# Patient Record
Sex: Male | Born: 1965 | Race: Black or African American | Hispanic: No | Smoking: Former smoker
Health system: Southern US, Community
[De-identification: ages and names within clinical notes are randomized; demographics above are authoritative.]

## PROBLEM LIST (undated history)

## (undated) DIAGNOSIS — I509 Heart failure, unspecified: Secondary | ICD-10-CM

## (undated) DIAGNOSIS — I10 Essential (primary) hypertension: Secondary | ICD-10-CM

## (undated) DIAGNOSIS — G7000 Myasthenia gravis without (acute) exacerbation: Secondary | ICD-10-CM

## (undated) DIAGNOSIS — K219 Gastro-esophageal reflux disease without esophagitis: Secondary | ICD-10-CM

## (undated) DIAGNOSIS — N183 Chronic kidney disease, stage 3 unspecified: Secondary | ICD-10-CM

## (undated) DIAGNOSIS — R911 Solitary pulmonary nodule: Secondary | ICD-10-CM

## (undated) DIAGNOSIS — Z95810 Presence of automatic (implantable) cardiac defibrillator: Secondary | ICD-10-CM

## (undated) DIAGNOSIS — K279 Peptic ulcer, site unspecified, unspecified as acute or chronic, without hemorrhage or perforation: Secondary | ICD-10-CM

## (undated) DIAGNOSIS — Z8711 Personal history of peptic ulcer disease: Secondary | ICD-10-CM

## (undated) DIAGNOSIS — R51 Headache: Secondary | ICD-10-CM

## (undated) DIAGNOSIS — I2510 Atherosclerotic heart disease of native coronary artery without angina pectoris: Secondary | ICD-10-CM

## (undated) DIAGNOSIS — F32A Depression, unspecified: Secondary | ICD-10-CM

## (undated) DIAGNOSIS — G4733 Obstructive sleep apnea (adult) (pediatric): Secondary | ICD-10-CM

## (undated) DIAGNOSIS — F1910 Other psychoactive substance abuse, uncomplicated: Secondary | ICD-10-CM

## (undated) DIAGNOSIS — F1011 Alcohol abuse, in remission: Secondary | ICD-10-CM

## (undated) DIAGNOSIS — F329 Major depressive disorder, single episode, unspecified: Secondary | ICD-10-CM

## (undated) HISTORY — PX: FACIAL RECONSTRUCTION SURGERY: SHX631

## (undated) HISTORY — DX: Myasthenia gravis without (acute) exacerbation: G70.00

## (undated) HISTORY — PX: OTHER SURGICAL HISTORY: SHX169

## (undated) HISTORY — PX: KNEE SURGERY: SHX244

---

## 1998-06-12 ENCOUNTER — Emergency Department (HOSPITAL_COMMUNITY): Admission: EM | Admit: 1998-06-12 | Discharge: 1998-06-12 | Payer: Self-pay | Admitting: Emergency Medicine

## 1998-07-02 ENCOUNTER — Emergency Department (HOSPITAL_COMMUNITY): Admission: EM | Admit: 1998-07-02 | Discharge: 1998-07-02 | Payer: Self-pay | Admitting: Internal Medicine

## 1998-07-02 ENCOUNTER — Encounter: Payer: Self-pay | Admitting: Internal Medicine

## 1998-08-24 ENCOUNTER — Encounter: Payer: Self-pay | Admitting: Emergency Medicine

## 1998-08-24 ENCOUNTER — Emergency Department (HOSPITAL_COMMUNITY): Admission: EM | Admit: 1998-08-24 | Discharge: 1998-08-24 | Payer: Self-pay | Admitting: Emergency Medicine

## 1998-11-28 ENCOUNTER — Emergency Department (HOSPITAL_COMMUNITY): Admission: EM | Admit: 1998-11-28 | Discharge: 1998-11-28 | Payer: Self-pay | Admitting: Emergency Medicine

## 1998-11-28 ENCOUNTER — Encounter: Payer: Self-pay | Admitting: Emergency Medicine

## 1999-03-11 ENCOUNTER — Emergency Department (HOSPITAL_COMMUNITY): Admission: EM | Admit: 1999-03-11 | Discharge: 1999-03-11 | Payer: Self-pay

## 1999-12-02 ENCOUNTER — Emergency Department (HOSPITAL_COMMUNITY): Admission: EM | Admit: 1999-12-02 | Discharge: 1999-12-02 | Payer: Self-pay | Admitting: Emergency Medicine

## 2001-03-02 ENCOUNTER — Encounter: Payer: Self-pay | Admitting: Emergency Medicine

## 2001-03-02 ENCOUNTER — Emergency Department (HOSPITAL_COMMUNITY): Admission: EM | Admit: 2001-03-02 | Discharge: 2001-03-02 | Payer: Self-pay | Admitting: Emergency Medicine

## 2001-12-20 ENCOUNTER — Emergency Department (HOSPITAL_COMMUNITY): Admission: EM | Admit: 2001-12-20 | Discharge: 2001-12-20 | Payer: Self-pay | Admitting: Emergency Medicine

## 2002-07-31 ENCOUNTER — Emergency Department (HOSPITAL_COMMUNITY): Admission: EM | Admit: 2002-07-31 | Discharge: 2002-07-31 | Payer: Self-pay | Admitting: Emergency Medicine

## 2002-08-23 ENCOUNTER — Emergency Department (HOSPITAL_COMMUNITY): Admission: EM | Admit: 2002-08-23 | Discharge: 2002-08-23 | Payer: Self-pay | Admitting: Emergency Medicine

## 2002-08-23 ENCOUNTER — Encounter: Payer: Self-pay | Admitting: Emergency Medicine

## 2002-10-01 ENCOUNTER — Emergency Department (HOSPITAL_COMMUNITY): Admission: AD | Admit: 2002-10-01 | Discharge: 2002-10-01 | Payer: Self-pay | Admitting: Emergency Medicine

## 2002-10-01 ENCOUNTER — Encounter: Payer: Self-pay | Admitting: Emergency Medicine

## 2004-03-10 ENCOUNTER — Emergency Department (HOSPITAL_COMMUNITY): Admission: EM | Admit: 2004-03-10 | Discharge: 2004-03-10 | Payer: Self-pay | Admitting: Emergency Medicine

## 2004-04-06 ENCOUNTER — Ambulatory Visit: Payer: Self-pay | Admitting: Internal Medicine

## 2004-04-10 ENCOUNTER — Ambulatory Visit (HOSPITAL_COMMUNITY): Admission: RE | Admit: 2004-04-10 | Discharge: 2004-04-10 | Payer: Self-pay | Admitting: Internal Medicine

## 2004-04-28 ENCOUNTER — Ambulatory Visit: Payer: Self-pay | Admitting: Internal Medicine

## 2004-05-15 ENCOUNTER — Ambulatory Visit: Payer: Self-pay | Admitting: Internal Medicine

## 2004-07-07 ENCOUNTER — Ambulatory Visit: Payer: Self-pay | Admitting: Internal Medicine

## 2004-08-24 ENCOUNTER — Ambulatory Visit: Payer: Self-pay | Admitting: Internal Medicine

## 2005-02-01 ENCOUNTER — Emergency Department (HOSPITAL_COMMUNITY): Admission: EM | Admit: 2005-02-01 | Discharge: 2005-02-01 | Payer: Self-pay | Admitting: Family Medicine

## 2005-03-08 ENCOUNTER — Emergency Department (HOSPITAL_COMMUNITY): Admission: EM | Admit: 2005-03-08 | Discharge: 2005-03-08 | Payer: Self-pay | Admitting: Emergency Medicine

## 2005-03-25 ENCOUNTER — Ambulatory Visit: Payer: Self-pay | Admitting: Internal Medicine

## 2005-03-26 ENCOUNTER — Ambulatory Visit (HOSPITAL_COMMUNITY): Admission: RE | Admit: 2005-03-26 | Discharge: 2005-03-26 | Payer: Self-pay | Admitting: Internal Medicine

## 2005-07-28 ENCOUNTER — Emergency Department (HOSPITAL_COMMUNITY): Admission: EM | Admit: 2005-07-28 | Discharge: 2005-07-28 | Payer: Self-pay | Admitting: Emergency Medicine

## 2005-12-17 ENCOUNTER — Emergency Department (HOSPITAL_COMMUNITY): Admission: EM | Admit: 2005-12-17 | Discharge: 2005-12-17 | Payer: Self-pay | Admitting: Emergency Medicine

## 2006-03-07 ENCOUNTER — Emergency Department (HOSPITAL_COMMUNITY): Admission: EM | Admit: 2006-03-07 | Discharge: 2006-03-07 | Payer: Self-pay | Admitting: Emergency Medicine

## 2006-05-24 ENCOUNTER — Emergency Department (HOSPITAL_COMMUNITY): Admission: EM | Admit: 2006-05-24 | Discharge: 2006-05-24 | Payer: Self-pay | Admitting: *Deleted

## 2006-05-30 ENCOUNTER — Emergency Department (HOSPITAL_COMMUNITY): Admission: EM | Admit: 2006-05-30 | Discharge: 2006-05-30 | Payer: Self-pay | Admitting: Emergency Medicine

## 2006-10-13 ENCOUNTER — Emergency Department (HOSPITAL_COMMUNITY): Admission: EM | Admit: 2006-10-13 | Discharge: 2006-10-13 | Payer: Self-pay | Admitting: Emergency Medicine

## 2006-11-02 ENCOUNTER — Inpatient Hospital Stay (HOSPITAL_COMMUNITY): Admission: EM | Admit: 2006-11-02 | Discharge: 2006-11-05 | Payer: Self-pay | Admitting: Internal Medicine

## 2006-11-02 ENCOUNTER — Ambulatory Visit: Payer: Self-pay | Admitting: Cardiology

## 2006-11-02 ENCOUNTER — Encounter: Payer: Self-pay | Admitting: Emergency Medicine

## 2006-11-03 ENCOUNTER — Encounter: Payer: Self-pay | Admitting: Internal Medicine

## 2006-11-09 ENCOUNTER — Ambulatory Visit: Payer: Self-pay | Admitting: Family Medicine

## 2006-11-11 ENCOUNTER — Emergency Department (HOSPITAL_COMMUNITY): Admission: EM | Admit: 2006-11-11 | Discharge: 2006-11-11 | Payer: Self-pay | Admitting: *Deleted

## 2006-12-02 ENCOUNTER — Inpatient Hospital Stay (HOSPITAL_COMMUNITY): Admission: EM | Admit: 2006-12-02 | Discharge: 2006-12-08 | Payer: Self-pay | Admitting: Emergency Medicine

## 2006-12-02 ENCOUNTER — Ambulatory Visit: Payer: Self-pay | Admitting: Cardiology

## 2006-12-09 ENCOUNTER — Ambulatory Visit (HOSPITAL_BASED_OUTPATIENT_CLINIC_OR_DEPARTMENT_OTHER): Admission: RE | Admit: 2006-12-09 | Discharge: 2006-12-09 | Payer: Self-pay | Admitting: *Deleted

## 2006-12-09 ENCOUNTER — Ambulatory Visit: Payer: Self-pay | Admitting: Internal Medicine

## 2006-12-19 ENCOUNTER — Ambulatory Visit: Payer: Self-pay | Admitting: Internal Medicine

## 2006-12-19 LAB — CONVERTED CEMR LAB
Calcium: 9.2 mg/dL (ref 8.4–10.5)
GFR calc Af Amer: 73 mL/min
Glucose, Bld: 108 mg/dL — ABNORMAL HIGH (ref 70–99)
Potassium: 4.2 meq/L (ref 3.5–5.1)
Pro B Natriuretic peptide (BNP): 330 pg/mL — ABNORMAL HIGH (ref 0.0–100.0)

## 2006-12-30 ENCOUNTER — Ambulatory Visit: Payer: Self-pay | Admitting: Cardiology

## 2007-01-01 ENCOUNTER — Emergency Department (HOSPITAL_COMMUNITY): Admission: EM | Admit: 2007-01-01 | Discharge: 2007-01-01 | Payer: Self-pay | Admitting: Emergency Medicine

## 2007-01-16 ENCOUNTER — Ambulatory Visit: Payer: Self-pay | Admitting: Family Medicine

## 2007-01-23 ENCOUNTER — Ambulatory Visit: Payer: Self-pay | Admitting: Cardiology

## 2007-01-23 ENCOUNTER — Emergency Department (HOSPITAL_COMMUNITY): Admission: EM | Admit: 2007-01-23 | Discharge: 2007-01-23 | Payer: Self-pay | Admitting: Emergency Medicine

## 2007-01-30 ENCOUNTER — Ambulatory Visit: Payer: Self-pay | Admitting: Internal Medicine

## 2007-02-20 ENCOUNTER — Ambulatory Visit: Payer: Self-pay | Admitting: Cardiology

## 2007-02-28 ENCOUNTER — Ambulatory Visit: Payer: Self-pay | Admitting: Internal Medicine

## 2007-02-28 LAB — CONVERTED CEMR LAB
Albumin: 4.2 g/dL (ref 3.5–5.2)
BUN: 12 mg/dL (ref 6–23)
Basophils Relative: 1 % (ref 0–1)
Chloride: 103 meq/L (ref 96–112)
HDL: 40 mg/dL (ref >39)
Lymphs Abs: 1.6 10*3/uL (ref 0.7–4.0)
Monocytes Absolute: 0.7 10*3/uL (ref 0.1–1.0)
Neutro Abs: 2.5 10*3/uL (ref 1.7–7.7)
Neutrophils Relative %: 48 % (ref 43–77)
Platelets: 201 10*3/uL (ref 150–400)
Potassium: 4 meq/L (ref 3.5–5.3)
RBC: 5.39 M/uL (ref 4.22–5.81)
RDW: 14.4 % (ref 11.5–15.5)
Sodium: 138 meq/L (ref 135–145)
Total Bilirubin: 0.9 mg/dL (ref 0.3–1.2)
VLDL: 34 mg/dL (ref 0–40)

## 2007-03-27 ENCOUNTER — Ambulatory Visit: Payer: Self-pay | Admitting: Cardiology

## 2007-04-04 ENCOUNTER — Ambulatory Visit: Payer: Self-pay

## 2007-04-04 ENCOUNTER — Encounter: Payer: Self-pay | Admitting: Cardiology

## 2007-04-19 HISTORY — PX: CARDIAC DEFIBRILLATOR PLACEMENT: SHX171

## 2007-04-26 ENCOUNTER — Ambulatory Visit: Payer: Self-pay | Admitting: Internal Medicine

## 2007-04-26 LAB — CONVERTED CEMR LAB
Basophils Absolute: 0 10*3/uL (ref 0.0–0.1)
CO2: 29 meq/L (ref 19–32)
Calcium: 9.2 mg/dL (ref 8.4–10.5)
Creatinine, Ser: 1.1 mg/dL (ref 0.4–1.5)
Eosinophils Absolute: 0.3 10*3/uL (ref 0.0–0.7)
GFR calc Af Amer: 96 mL/min
Glucose, Bld: 223 mg/dL — ABNORMAL HIGH (ref 70–99)
Monocytes Absolute: 0.6 10*3/uL (ref 0.1–1.0)
Platelets: 281 10*3/uL (ref 150–400)
Potassium: 3.8 meq/L (ref 3.5–5.1)
RDW: 13.8 % (ref 11.5–14.6)
Sodium: 139 meq/L (ref 135–145)
WBC: 7.2 10*3/uL (ref 4.5–10.5)
aPTT: 31.1 s — ABNORMAL HIGH (ref 21.7–29.8)

## 2007-05-08 ENCOUNTER — Ambulatory Visit: Payer: Self-pay | Admitting: Internal Medicine

## 2007-05-08 ENCOUNTER — Ambulatory Visit (HOSPITAL_COMMUNITY): Admission: RE | Admit: 2007-05-08 | Discharge: 2007-05-10 | Payer: Self-pay | Admitting: Internal Medicine

## 2007-05-09 ENCOUNTER — Ambulatory Visit: Payer: Self-pay | Admitting: Internal Medicine

## 2007-05-11 ENCOUNTER — Ambulatory Visit: Payer: Self-pay | Admitting: Internal Medicine

## 2007-05-11 ENCOUNTER — Ambulatory Visit: Payer: Self-pay | Admitting: Cardiology

## 2007-05-11 LAB — CONVERTED CEMR LAB
Bilirubin, Direct: 0.1 mg/dL (ref 0.0–0.3)
Cholesterol: 260 mg/dL (ref 0–200)
Triglycerides: 220 mg/dL (ref 0–149)
VLDL: 44 mg/dL — ABNORMAL HIGH (ref 0–40)

## 2007-05-28 DIAGNOSIS — E1129 Type 2 diabetes mellitus with other diabetic kidney complication: Secondary | ICD-10-CM | POA: Insufficient documentation

## 2007-05-29 ENCOUNTER — Ambulatory Visit: Payer: Self-pay

## 2007-05-30 ENCOUNTER — Ambulatory Visit: Payer: Self-pay | Admitting: Internal Medicine

## 2007-07-17 ENCOUNTER — Ambulatory Visit: Payer: Self-pay | Admitting: Internal Medicine

## 2007-08-09 ENCOUNTER — Ambulatory Visit: Payer: Self-pay | Admitting: Internal Medicine

## 2007-08-14 ENCOUNTER — Ambulatory Visit: Payer: Self-pay | Admitting: Internal Medicine

## 2007-08-16 ENCOUNTER — Ambulatory Visit: Payer: Self-pay | Admitting: Cardiology

## 2007-08-16 LAB — CONVERTED CEMR LAB
Alkaline Phosphatase: 122 units/L — ABNORMAL HIGH (ref 39–117)
BUN: 12 mg/dL (ref 6–23)
Basophils Relative: 0.6 % (ref 0.0–3.0)
Calcium: 9.4 mg/dL (ref 8.4–10.5)
Chloride: 103 meq/L (ref 96–112)
Creatinine, Ser: 1.1 mg/dL (ref 0.4–1.5)
Direct LDL: 152.6 mg/dL
GFR calc Af Amer: 96 mL/min
Glucose, Bld: 241 mg/dL — ABNORMAL HIGH (ref 70–99)
Hemoglobin: 16.9 g/dL (ref 13.0–17.0)
MCHC: 34.2 g/dL (ref 30.0–36.0)
Monocytes Absolute: 0.5 10*3/uL (ref 0.1–1.0)
Monocytes Relative: 8.9 % (ref 3.0–12.0)
Neutrophils Relative %: 50 % (ref 43.0–77.0)
Platelets: 256 10*3/uL (ref 150–400)
RBC: 5.52 M/uL (ref 4.22–5.81)
Sodium: 137 meq/L (ref 135–145)
Total Bilirubin: 1.1 mg/dL (ref 0.3–1.2)
Total CHOL/HDL Ratio: 5.6

## 2007-08-28 ENCOUNTER — Ambulatory Visit: Payer: Self-pay

## 2007-08-29 ENCOUNTER — Emergency Department (HOSPITAL_COMMUNITY): Admission: EM | Admit: 2007-08-29 | Discharge: 2007-08-29 | Payer: Self-pay | Admitting: Emergency Medicine

## 2007-09-06 ENCOUNTER — Ambulatory Visit: Payer: Self-pay | Admitting: Internal Medicine

## 2007-09-06 LAB — CONVERTED CEMR LAB
BUN: 17 mg/dL (ref 6–23)
CO2: 22 meq/L (ref 19–32)
Chloride: 101 meq/L (ref 96–112)
Creatinine, Ser: 1.15 mg/dL (ref 0.40–1.50)
Glucose, Bld: 217 mg/dL — ABNORMAL HIGH (ref 70–99)
HDL: 41 mg/dL (ref >39)
LDL Cholesterol: 76 mg/dL (ref 0–99)
Sodium: 136 meq/L (ref 135–145)
Total CHOL/HDL Ratio: 4.1
Triglycerides: 262 mg/dL — ABNORMAL HIGH (ref <150)
VLDL: 52 mg/dL — ABNORMAL HIGH (ref 0–40)

## 2007-09-15 ENCOUNTER — Ambulatory Visit: Payer: Self-pay | Admitting: Internal Medicine

## 2007-09-30 ENCOUNTER — Inpatient Hospital Stay (HOSPITAL_COMMUNITY): Admission: EM | Admit: 2007-09-30 | Discharge: 2007-10-02 | Payer: Self-pay

## 2007-10-23 ENCOUNTER — Ambulatory Visit: Payer: Self-pay | Admitting: Internal Medicine

## 2007-11-01 ENCOUNTER — Ambulatory Visit: Payer: Self-pay | Admitting: Internal Medicine

## 2007-11-13 ENCOUNTER — Ambulatory Visit: Payer: Self-pay | Admitting: Internal Medicine

## 2007-11-27 ENCOUNTER — Ambulatory Visit: Payer: Self-pay | Admitting: Internal Medicine

## 2007-12-06 ENCOUNTER — Ambulatory Visit: Payer: Self-pay | Admitting: Internal Medicine

## 2007-12-06 LAB — CONVERTED CEMR LAB
BUN: 12 mg/dL (ref 6–23)
CO2: 20 meq/L (ref 19–32)
Chloride: 101 meq/L (ref 96–112)
Digitoxin Lvl: 0.2 ng/mL — ABNORMAL LOW (ref 0.8–2.0)
Glucose, Bld: 380 mg/dL — ABNORMAL HIGH (ref 70–99)
Sodium: 135 meq/L (ref 135–145)
Triglycerides: 588 mg/dL — ABNORMAL HIGH (ref <150)

## 2008-02-07 ENCOUNTER — Ambulatory Visit: Payer: Self-pay | Admitting: Family Medicine

## 2008-02-08 ENCOUNTER — Ambulatory Visit (HOSPITAL_COMMUNITY): Admission: RE | Admit: 2008-02-08 | Discharge: 2008-02-08 | Payer: Self-pay | Admitting: Internal Medicine

## 2008-02-29 ENCOUNTER — Ambulatory Visit: Payer: Self-pay | Admitting: Internal Medicine

## 2008-03-01 ENCOUNTER — Encounter: Payer: Self-pay | Admitting: Internal Medicine

## 2008-03-28 ENCOUNTER — Ambulatory Visit: Payer: Self-pay | Admitting: Internal Medicine

## 2008-05-09 ENCOUNTER — Ambulatory Visit: Payer: Self-pay | Admitting: Family Medicine

## 2008-05-13 ENCOUNTER — Encounter (INDEPENDENT_AMBULATORY_CARE_PROVIDER_SITE_OTHER): Payer: Self-pay | Admitting: Internal Medicine

## 2008-05-13 LAB — CONVERTED CEMR LAB
Albumin: 3.9 g/dL (ref 3.5–5.2)
BUN: 13 mg/dL (ref 6–23)
Calcium: 9.7 mg/dL (ref 8.4–10.5)
Cholesterol: 278 mg/dL — ABNORMAL HIGH (ref 0–200)
HDL: 44 mg/dL (ref >39)
Magnesium: 2 mg/dL (ref 1.5–2.5)
Sodium: 135 meq/L (ref 135–145)
TSH: 0.722 microintl units/mL (ref 0.350–4.500)
Total Bilirubin: 0.4 mg/dL (ref 0.3–1.2)
Total CHOL/HDL Ratio: 6.3
Total Protein: 7.4 g/dL (ref 6.0–8.3)
VLDL: 57 mg/dL — ABNORMAL HIGH (ref 0–40)

## 2008-06-04 ENCOUNTER — Ambulatory Visit: Payer: Self-pay | Admitting: *Deleted

## 2008-06-06 ENCOUNTER — Ambulatory Visit: Payer: Self-pay | Admitting: Internal Medicine

## 2008-06-12 LAB — CONVERTED CEMR LAB
BUN: 17 mg/dL (ref 6–23)
GFR calc non Af Amer: 78.34 mL/min (ref >60)
Pro B Natriuretic peptide (BNP): 83 pg/mL (ref 0.0–100.0)

## 2008-06-19 ENCOUNTER — Encounter: Payer: Self-pay | Admitting: Cardiology

## 2008-07-09 ENCOUNTER — Ambulatory Visit: Payer: Self-pay | Admitting: Family Medicine

## 2008-08-15 ENCOUNTER — Ambulatory Visit: Payer: Self-pay | Admitting: Internal Medicine

## 2008-08-22 ENCOUNTER — Ambulatory Visit: Payer: Self-pay | Admitting: Internal Medicine

## 2008-09-02 ENCOUNTER — Encounter: Payer: Self-pay | Admitting: Internal Medicine

## 2008-09-02 ENCOUNTER — Ambulatory Visit: Payer: Self-pay | Admitting: Internal Medicine

## 2008-09-09 ENCOUNTER — Encounter: Payer: Self-pay | Admitting: Internal Medicine

## 2008-11-29 ENCOUNTER — Emergency Department (HOSPITAL_COMMUNITY): Admission: EM | Admit: 2008-11-29 | Discharge: 2008-11-30 | Payer: Self-pay | Admitting: Emergency Medicine

## 2008-12-01 ENCOUNTER — Encounter: Payer: Self-pay | Admitting: Internal Medicine

## 2008-12-02 ENCOUNTER — Ambulatory Visit: Payer: Self-pay | Admitting: Internal Medicine

## 2008-12-05 ENCOUNTER — Encounter: Payer: Self-pay | Admitting: Internal Medicine

## 2008-12-30 ENCOUNTER — Ambulatory Visit: Payer: Self-pay | Admitting: Internal Medicine

## 2009-03-02 ENCOUNTER — Encounter: Payer: Self-pay | Admitting: Internal Medicine

## 2009-03-14 ENCOUNTER — Encounter: Payer: Self-pay | Admitting: Internal Medicine

## 2009-03-17 ENCOUNTER — Ambulatory Visit: Payer: Self-pay | Admitting: Internal Medicine

## 2009-05-27 DIAGNOSIS — G4733 Obstructive sleep apnea (adult) (pediatric): Secondary | ICD-10-CM

## 2009-05-28 ENCOUNTER — Encounter (INDEPENDENT_AMBULATORY_CARE_PROVIDER_SITE_OTHER): Payer: Self-pay | Admitting: *Deleted

## 2009-08-15 ENCOUNTER — Inpatient Hospital Stay (HOSPITAL_COMMUNITY): Admission: EM | Admit: 2009-08-15 | Discharge: 2009-08-16 | Payer: Self-pay | Admitting: Emergency Medicine

## 2009-08-15 ENCOUNTER — Ambulatory Visit: Payer: Self-pay | Admitting: Cardiovascular Disease

## 2009-08-15 ENCOUNTER — Encounter (INDEPENDENT_AMBULATORY_CARE_PROVIDER_SITE_OTHER): Payer: Self-pay | Admitting: Internal Medicine

## 2009-08-25 ENCOUNTER — Ambulatory Visit: Payer: Self-pay | Admitting: Internal Medicine

## 2009-09-09 ENCOUNTER — Ambulatory Visit: Payer: Self-pay | Admitting: Internal Medicine

## 2009-09-09 LAB — CONVERTED CEMR LAB
ALT: 13 units/L (ref 0–53)
Alkaline Phosphatase: 94 units/L (ref 39–117)
BUN: 19 mg/dL (ref 6–23)
Chloride: 105 meq/L (ref 96–112)
HDL: 42 mg/dL (ref >39)
Pro B Natriuretic peptide (BNP): 193.4 pg/mL — ABNORMAL HIGH (ref 0.0–100.0)
Total Bilirubin: 0.4 mg/dL (ref 0.3–1.2)
Total CHOL/HDL Ratio: 6.2
Total Protein: 6.9 g/dL (ref 6.0–8.3)
VLDL: 71 mg/dL — ABNORMAL HIGH (ref 0–40)

## 2009-10-17 ENCOUNTER — Encounter (INDEPENDENT_AMBULATORY_CARE_PROVIDER_SITE_OTHER): Payer: Self-pay | Admitting: *Deleted

## 2009-10-31 ENCOUNTER — Encounter: Payer: Self-pay | Admitting: Internal Medicine

## 2010-02-17 NOTE — Letter (Signed)
Summary: Remote Device Check  [facility], Main Office  [address]   [place], [place] [address]   Phone: [phone]  Fax: [phone]     March 14, 2009 MRN: [id number]   [name] [address] [place], [place]  [address]   Dear Mr. Seebeck,   Your remote transmission was recieved and reviewed by your physician.  All diagnostics were within normal limits for you.    ___X___Your next office visit is scheduled for:   MAY 2011 WITH DR Ladona Ridgel. Please call our office to schedule an appointment.    Sincerely,  Proofreader

## 2010-02-17 NOTE — Cardiovascular Report (Signed)
Summary: Office Visit Remote   Office Visit Remote   Imported By: Roderic Ovens 03/17/2009 11:06:54  _____________________________________________________________________  External Attachment:    Type:   Image     Comment:   External Document

## 2010-02-17 NOTE — Letter (Signed)
Summary: Device-Delinquent Check   [facility], Main Office  [address]   [place], [place] [address]   Phone: [phone]  Fax: [phone]     October 17, 2009 MRN: [id number]   [name] [address] [place], [place]  [address]   Dear Mr. Wilcher,  According to our records, you have not had your implanted device checked in the recommended period of time.  We are unable to determine appropriate device function without checking your device on a regular basis.  Please call our office to schedule an appointment  with Dr Ladona Ridgel, as soon as possible.  If you are having your device checked by another physician, please call us so that we may update our records.  Thank you,  Letta Moynahan, EMT  October 17, 2009 2:49 PM  [facility]  certified

## 2010-02-17 NOTE — Cardiovascular Report (Signed)
Summary: Certified Letter Signed - Other (no appt made)  Certified Letter Signed - Other (no appt made)   Imported By: Debby Freiberg 11/25/2009 13:06:38  _____________________________________________________________________  External Attachment:    Type:   Image     Comment:   External Document

## 2010-02-17 NOTE — Letter (Signed)
Summary: Appointment - Missed  [facility], Main Office  [address]   [place], [place] [address]   Phone: [phone]  Fax: [phone]     May 28, 2009 MRN: [id number]   KATLIN CISZEWSKI [address] [place], [place]  [address]   Dear Mr. Doan,  Our records indicate you missed your appointment on 5/10 with Dr Ladona Ridgel. It is very important that we reach you to reschedule this appointment. We look forward to participating in your health care needs. Please contact us at the number listed above at your earliest convenience to reschedule this appointment.     Sincerely,   Ruel Favors Scheduling Team

## 2010-03-30 ENCOUNTER — Inpatient Hospital Stay (HOSPITAL_COMMUNITY)
Admission: EM | Admit: 2010-03-30 | Discharge: 2010-04-02 | DRG: 291 | Disposition: A | Discharge status: Home Health | Payer: Medicare Other | Attending: Internal Medicine | Admitting: Internal Medicine

## 2010-03-30 ENCOUNTER — Emergency Department (HOSPITAL_COMMUNITY): Payer: Medicare Other

## 2010-03-30 ENCOUNTER — Encounter: Payer: Self-pay | Admitting: Internal Medicine

## 2010-03-30 DIAGNOSIS — N179 Acute kidney failure, unspecified: Secondary | ICD-10-CM | POA: Diagnosis present

## 2010-03-30 DIAGNOSIS — I428 Other cardiomyopathies: Secondary | ICD-10-CM | POA: Diagnosis present

## 2010-03-30 DIAGNOSIS — I129 Hypertensive chronic kidney disease with stage 1 through stage 4 chronic kidney disease, or unspecified chronic kidney disease: Secondary | ICD-10-CM | POA: Diagnosis present

## 2010-03-30 DIAGNOSIS — I2510 Atherosclerotic heart disease of native coronary artery without angina pectoris: Secondary | ICD-10-CM | POA: Diagnosis present

## 2010-03-30 DIAGNOSIS — K279 Peptic ulcer, site unspecified, unspecified as acute or chronic, without hemorrhage or perforation: Secondary | ICD-10-CM | POA: Diagnosis present

## 2010-03-30 DIAGNOSIS — I5031 Acute diastolic (congestive) heart failure: Principal | ICD-10-CM | POA: Diagnosis present

## 2010-03-30 DIAGNOSIS — E785 Hyperlipidemia, unspecified: Secondary | ICD-10-CM | POA: Diagnosis present

## 2010-03-30 DIAGNOSIS — I509 Heart failure, unspecified: Secondary | ICD-10-CM | POA: Diagnosis present

## 2010-03-30 DIAGNOSIS — T502X5A Adverse effect of carbonic-anhydrase inhibitors, benzothiadiazides and other diuretics, initial encounter: Secondary | ICD-10-CM | POA: Diagnosis present

## 2010-03-30 DIAGNOSIS — Z95810 Presence of automatic (implantable) cardiac defibrillator: Secondary | ICD-10-CM

## 2010-03-30 DIAGNOSIS — J189 Pneumonia, unspecified organism: Secondary | ICD-10-CM | POA: Diagnosis present

## 2010-03-30 DIAGNOSIS — E119 Type 2 diabetes mellitus without complications: Secondary | ICD-10-CM | POA: Diagnosis present

## 2010-03-30 DIAGNOSIS — N189 Chronic kidney disease, unspecified: Secondary | ICD-10-CM | POA: Diagnosis present

## 2010-03-30 DIAGNOSIS — D72829 Elevated white blood cell count, unspecified: Secondary | ICD-10-CM | POA: Diagnosis present

## 2010-03-30 LAB — CBC
HCT: 43.3 % (ref 39.0–52.0)
RBC: 5.25 MIL/uL (ref 4.22–5.81)
RDW: 14.1 % (ref 11.5–15.5)

## 2010-03-30 LAB — BASIC METABOLIC PANEL
BUN: 9 mg/dL (ref 6–23)
Calcium: 8.4 mg/dL (ref 8.4–10.5)
Chloride: 104 mEq/L (ref 96–112)
Creatinine, Ser: 1.19 mg/dL (ref 0.4–1.5)
GFR calc non Af Amer: >60 mL/min (ref >60)

## 2010-03-30 LAB — POCT CARDIAC MARKERS: Troponin i, poc: <0.05 ng/mL (ref 0.00–0.09)

## 2010-03-30 LAB — RAPID URINE DRUG SCREEN, HOSP PERFORMED
Amphetamines: NOT DETECTED
Barbiturates: NOT DETECTED
Benzodiazepines: NOT DETECTED
Tetrahydrocannabinol: POSITIVE — AB

## 2010-03-30 LAB — DIFFERENTIAL
Basophils Relative: 0 % (ref 0–1)
Eosinophils Absolute: 0.1 10*3/uL (ref 0.0–0.7)
Monocytes Relative: 8 % (ref 3–12)

## 2010-03-30 LAB — BRAIN NATRIURETIC PEPTIDE: Pro B Natriuretic peptide (BNP): 526 pg/mL — ABNORMAL HIGH (ref 0.0–100.0)
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
## 2010-03-31 DIAGNOSIS — J189 Pneumonia, unspecified organism: Secondary | ICD-10-CM

## 2010-03-31 DIAGNOSIS — R079 Chest pain, unspecified: Secondary | ICD-10-CM

## 2010-03-31 LAB — CARDIAC PANEL(CRET KIN+CKTOT+MB+TROPI)
Relative Index: 0.4 (ref 0.0–2.5)
Relative Index: 0.4 (ref 0.0–2.5)
Total CK: 655 U/L — ABNORMAL HIGH (ref 7–232)
Troponin I: 0.08 ng/mL — ABNORMAL HIGH (ref 0.00–0.06)
Troponin I: 0.06 ng/mL (ref 0.00–0.06)

## 2010-03-31 LAB — GLUCOSE, CAPILLARY
Glucose-Capillary: 147 mg/dL — ABNORMAL HIGH (ref 70–99)
Glucose-Capillary: 290 mg/dL — ABNORMAL HIGH (ref 70–99)

## 2010-03-31 LAB — LIPID PANEL: Triglycerides: 76 mg/dL (ref <150)

## 2010-03-31 LAB — STREP PNEUMONIAE URINARY ANTIGEN: Strep Pneumo Urinary Antigen: NEGATIVE

## 2010-03-31 LAB — HIV ANTIBODY (ROUTINE TESTING W REFLEX): HIV: NONREACTIVE

## 2010-04-01 LAB — BASIC METABOLIC PANEL
BUN: 16 mg/dL (ref 6–23)
Chloride: 107 mEq/L (ref 96–112)
Glucose, Bld: 88 mg/dL (ref 70–99)
Potassium: 3.4 mEq/L — ABNORMAL LOW (ref 3.5–5.1)

## 2010-04-01 LAB — CBC
HCT: 42 % (ref 39.0–52.0)
MCV: 83.5 fL (ref 78.0–100.0)
RBC: 5.03 MIL/uL (ref 4.22–5.81)
WBC: 8 10*3/uL (ref 4.0–10.5)

## 2010-04-01 LAB — GLUCOSE, CAPILLARY: Glucose-Capillary: 89 mg/dL (ref 70–99)

## 2010-04-01 LAB — LEGIONELLA ANTIGEN, URINE: Legionella Antigen, Urine: NEGATIVE

## 2010-04-02 DIAGNOSIS — R079 Chest pain, unspecified: Secondary | ICD-10-CM

## 2010-04-02 DIAGNOSIS — J189 Pneumonia, unspecified organism: Secondary | ICD-10-CM

## 2010-04-02 LAB — CBC
MCH: 29 pg (ref 26.0–34.0)
Platelets: 301 10*3/uL (ref 150–400)
RBC: 4.97 MIL/uL (ref 4.22–5.81)
WBC: 6.5 10*3/uL (ref 4.0–10.5)

## 2010-04-02 LAB — BASIC METABOLIC PANEL
Calcium: 8.3 mg/dL — ABNORMAL LOW (ref 8.4–10.5)
Creatinine, Ser: 1.36 mg/dL (ref 0.4–1.5)
GFR calc Af Amer: >60 mL/min (ref >60)

## 2010-04-02 LAB — BRAIN NATRIURETIC PEPTIDE: Pro B Natriuretic peptide (BNP): 444 pg/mL — ABNORMAL HIGH (ref 0.0–100.0)

## 2010-04-04 LAB — LIPID PANEL
Cholesterol: 235 mg/dL — ABNORMAL HIGH (ref 0–200)
Triglycerides: 206 mg/dL — ABNORMAL HIGH (ref <150)

## 2010-04-04 LAB — CARDIAC PANEL(CRET KIN+CKTOT+MB+TROPI)
CK, MB: 1.5 ng/mL (ref 0.3–4.0)
Total CK: 191 U/L (ref 7–232)
Troponin I: 0.1 ng/mL — ABNORMAL HIGH (ref 0.00–0.06)
Troponin I: 0.14 ng/mL — ABNORMAL HIGH (ref 0.00–0.06)

## 2010-04-04 LAB — URINE MICROSCOPIC-ADD ON

## 2010-04-04 LAB — CBC
Hemoglobin: 14.7 g/dL (ref 13.0–17.0)
MCHC: 34.3 g/dL (ref 30.0–36.0)
MCV: 86 fL (ref 78.0–100.0)
Platelets: 173 10*3/uL (ref 150–400)
RBC: 4.93 MIL/uL (ref 4.22–5.81)
RBC: 5.67 MIL/uL (ref 4.22–5.81)
RDW: 14 % (ref 11.5–15.5)
WBC: 7.5 10*3/uL (ref 4.0–10.5)
WBC: 7.6 10*3/uL (ref 4.0–10.5)

## 2010-04-04 LAB — PROCALCITONIN: Procalcitonin: 0.88 ng/mL

## 2010-04-04 LAB — CULTURE, BLOOD (ROUTINE X 2)

## 2010-04-04 LAB — URINALYSIS, ROUTINE W REFLEX MICROSCOPIC
Glucose, UA: 250 mg/dL — AB
Ketones, ur: 15 mg/dL — AB
Specific Gravity, Urine: 1.031 — ABNORMAL HIGH (ref 1.005–1.030)
pH: 6 (ref 5.0–8.0)

## 2010-04-04 LAB — DIFFERENTIAL
Basophils Absolute: 0 10*3/uL (ref 0.0–0.1)
Eosinophils Relative: 0 % (ref 0–5)
Lymphocytes Relative: 11 % — ABNORMAL LOW (ref 12–46)
Neutro Abs: 6.1 10*3/uL (ref 1.7–7.7)
Neutrophils Relative %: 81 % — ABNORMAL HIGH (ref 43–77)

## 2010-04-04 LAB — COMPREHENSIVE METABOLIC PANEL
ALT: 18 U/L (ref 0–53)
Albumin: 2.2 g/dL — ABNORMAL LOW (ref 3.5–5.2)
Calcium: 7.6 mg/dL — ABNORMAL LOW (ref 8.4–10.5)
Glucose, Bld: 247 mg/dL — ABNORMAL HIGH (ref 70–99)
Sodium: 133 mEq/L — ABNORMAL LOW (ref 135–145)
Total Protein: 6.5 g/dL (ref 6.0–8.3)

## 2010-04-04 LAB — LACTIC ACID, PLASMA
Lactic Acid, Venous: 1.2 mmol/L (ref 0.5–2.2)
Lactic Acid, Venous: 1.4 mmol/L (ref 0.5–2.2)

## 2010-04-04 LAB — HEMOGLOBIN A1C: Hgb A1c MFr Bld: 7.5 % — ABNORMAL HIGH (ref <5.7)

## 2010-04-04 LAB — TSH: TSH: 0.877 u[IU]/mL (ref 0.350–4.500)

## 2010-04-04 LAB — OVA AND PARASITE EXAMINATION: Ova and parasites: NONE SEEN

## 2010-04-04 LAB — STOOL CULTURE

## 2010-04-04 LAB — BASIC METABOLIC PANEL
Calcium: 7.8 mg/dL — ABNORMAL LOW (ref 8.4–10.5)
GFR calc Af Amer: >60 mL/min (ref >60)
GFR calc non Af Amer: 56 mL/min — ABNORMAL LOW (ref >60)
Glucose, Bld: 144 mg/dL — ABNORMAL HIGH (ref 70–99)
Sodium: 136 mEq/L (ref 135–145)

## 2010-04-04 LAB — GLUCOSE, CAPILLARY
Glucose-Capillary: 149 mg/dL — ABNORMAL HIGH (ref 70–99)
Glucose-Capillary: 207 mg/dL — ABNORMAL HIGH (ref 70–99)
Glucose-Capillary: 213 mg/dL — ABNORMAL HIGH (ref 70–99)
Glucose-Capillary: 238 mg/dL — ABNORMAL HIGH (ref 70–99)
Glucose-Capillary: 284 mg/dL — ABNORMAL HIGH (ref 70–99)

## 2010-04-04 LAB — MAGNESIUM: Magnesium: 2.3 mg/dL (ref 1.5–2.5)

## 2010-04-04 LAB — HEMOCCULT GUIAC POC 1CARD (OFFICE): Fecal Occult Bld: NEGATIVE

## 2010-04-07 ENCOUNTER — Encounter: Payer: Self-pay | Admitting: Internal Medicine

## 2010-04-07 ENCOUNTER — Ambulatory Visit (INDEPENDENT_AMBULATORY_CARE_PROVIDER_SITE_OTHER): Payer: Medicare Other | Admitting: Internal Medicine

## 2010-04-07 DIAGNOSIS — I10 Essential (primary) hypertension: Secondary | ICD-10-CM

## 2010-04-07 DIAGNOSIS — E1169 Type 2 diabetes mellitus with other specified complication: Secondary | ICD-10-CM

## 2010-04-07 DIAGNOSIS — Z95810 Presence of automatic (implantable) cardiac defibrillator: Secondary | ICD-10-CM

## 2010-04-07 DIAGNOSIS — G4733 Obstructive sleep apnea (adult) (pediatric): Secondary | ICD-10-CM

## 2010-04-07 DIAGNOSIS — E119 Type 2 diabetes mellitus without complications: Secondary | ICD-10-CM

## 2010-04-07 DIAGNOSIS — E782 Mixed hyperlipidemia: Secondary | ICD-10-CM

## 2010-04-07 DIAGNOSIS — I5023 Acute on chronic systolic (congestive) heart failure: Secondary | ICD-10-CM

## 2010-04-07 DIAGNOSIS — I5022 Chronic systolic (congestive) heart failure: Secondary | ICD-10-CM | POA: Insufficient documentation

## 2010-04-07 DIAGNOSIS — I509 Heart failure, unspecified: Secondary | ICD-10-CM

## 2010-04-07 LAB — COMPREHENSIVE METABOLIC PANEL
ALT: 24 U/L (ref 0–53)
CO2: 26 mEq/L (ref 19–32)
Creat: 1.44 mg/dL (ref 0.40–1.50)
Total Bilirubin: 0.4 mg/dL (ref 0.3–1.2)

## 2010-04-07 LAB — BRAIN NATRIURETIC PEPTIDE: Brain Natriuretic Peptide: 632 pg/mL — ABNORMAL HIGH (ref 0.0–100.0)

## 2010-04-07 LAB — GLUCOSE, CAPILLARY: Glucose-Capillary: 77 mg/dL (ref 70–99)

## 2010-04-07 MED ORDER — INSULIN GLARGINE 100 UNIT/ML ~~LOC~~ SOLN: 20.0000 [IU] | Freq: Every day | SUBCUTANEOUS | Status: DC

## 2010-04-07 MED ORDER — FUROSEMIDE 80 MG PO TABS: 80.0000 mg | ORAL_TABLET | Freq: Two times a day (BID) | ORAL | Status: DC

## 2010-04-07 NOTE — Assessment & Plan Note (Addendum)
No change in insulin doses at this time-a major improvement for him-he in fact has most, if not all his medications

## 2010-04-07 NOTE — Progress Notes (Signed)
  Subjective:    Patient ID: [name], male    DOB: 1965/07/16, 45 y.o.   MRN: [id number]  HPI Mr Lady here on follow up after hospitalization 3/12-3/15 for CHF precipitated by and upper respiratory infection, early pneumonia. He was treated with Azithromycin, diuresis, sent home. He notes some ongoing DOE and 1-2 pillow orthopnea. No PND or exertional angina.   Is at home with his wife, children.  Review of Systems No hypoglycemic spells. Does not have his meter here with him-says he is checking bid with range "n the 100's" He is illiterate and his wife helps him manage his medications. He notes she is no longer cooking a lot of fried foods, and has cut back on adding salt to the cooking as well.      Objective:   Physical Exam    As always, a bit dissheveled with marginal hygene, but looks better put together than he has in the past No JVD Lungs CTA CAR; Sl tachy at 95, S4 gallop, no S3, 1/6 Systolic murmer    Assessment & Plan:   See prob List

## 2010-04-07 NOTE — Assessment & Plan Note (Signed)
Increase furosemide to 80 mg bid Check CMET and BNP today Needs FU in one month (with me) to recheck potassium and creatinine

## 2010-04-07 NOTE — Assessment & Plan Note (Signed)
Recheck BP after furomide increased, 4 weeks

## 2010-04-10 ENCOUNTER — Telehealth: Payer: Self-pay | Admitting: *Deleted

## 2010-04-10 NOTE — Telephone Encounter (Signed)
HHN calls clinic to state [facility] visits were ordered at hosp disch, today she made 4 visits before someone came to door of the home and stated pt was not home and she didn't know when he would be back. [facility] has now discharged [facility] from [facility] services due to the fact pt is not homebound. Dr Reche Dixon is made aware of this.

## 2010-04-15 NOTE — Discharge Summary (Signed)
[name], [name]  MEDICAL RECORD NO.:  [id number]           PATIENT TYPE:  I  LOCATION:  2005                         FACILITY:  [facility]  PHYSICIAN:  Blanch Media, M.D.DATE OF BIRTH:  [birth year]  DATE OF ADMISSION:  03/30/2010 DATE OF DISCHARGE:  04/02/2010                              DISCHARGE SUMMARY   DISCHARGE DIAGNOSES: 1. Shortness of breath secondary to congestive heart failure. 2. Right lower lobe and lingular pneumonia. 3. Nonischemic cardiomyopathy status post ICD. 4. Peptic ulcer disease. 5. Diabetes type 2.  DISCHARGE MEDICATIONS: 1. Coreg 25 mg p.o. b.i.d. 2. Aldactone 25 mg p.o. daily. 3. Lantus 20 units subcu at bedtime.  The patient may decrease to 10-     15 units at that time if blood sugar is less than 100. 4. Nitroglycerin 0.4 mg tabs sublingual q.5 minutes p.r.n. x3 as     needed for chest pain. 5. Tessalon Perles 200 mg p.o. t.i.d. p.r.n. cough. 6. Furosemide 80 mg p.o. daily. 7. Simvastatin 40 mg p.o. at bedtime. 8. Azithromycin 500 mg p.o. daily x4 days. 9. Cyclobenzaprine 10 mg p.o. at bedtime. 10.Prozac 20 mg p.o. daily. 11.Benazepril 20 mg p.o. b.i.d. 12.Ibuprofen 200 mg p.o. daily p.r.n. headache. 13.Glipizide 10 mg p.o. q.a.m.  DISPOSITION AND FOLLOWUP:  [name] was discharged from [facility] on April 02, 2010, in stable improved condition.  His shortness of breath and chest pain has resolved.  He will need to continue to take his Zithromax 500 mg p.o. daily x4 days to finish a total of 7 days of antibiotics for his right lower lobe and lingular infiltrate.  He will have an appointment with Dr. Reche Dixon on April 07, 2010, at 3:45 p.m.  At that time, he will need to, 1. Check his BMP to make sure his creatinine is not trending up given     that he is on Lasix 80 mg p.o. daily. 2. Please refer him to Cardiology given that he has an EF of 20% to     25% and diffuse hypokinesis on 2-D echo  on August 16, 2010.  The     patient will also need a referral for cardiac rehab, which will     greatly benefit him since the patient is afraid that he will drop     dead therefore the patient does not exercise or move around a lot     at home.  The patient reports lying in bed for most of the time     because of fear.   3. The patient will also need a repeat sleep study     since his previous sleep study showed mild obstructive sleep apnea.     Therefore, the patient has been on home oxygen by nasal cannula at     night.  If his obstructive sleep apnea is worse, he may need a Z-     Pak. 3. Please repeat chest x-ray in 4-6 weeks to follow the resolution of     his right lower lobe and lingular infiltrates, if no resolution     please  send for CT scan of chest. 4. Please follow up on his diabetes.  The patient reported prior to     admission he was on 65 units of Lantus; however, during this     hospital admission his CBG was adequately to well controlled with     only 30 units of Lantus at night.  Therefore, the patient was sent     home with 20 units at bedtime and was instructed to decrease his     Lantus even lower to 10 or 15 units if his blood sugar continued to     be less than 100 to avoid any hypoglycemia episode.  CONSULTATION:  Cardiac rehab.  PROCEDURE PERFORMED:  Chest x-ray on March 30, 2010, showed central mild bronchitic changes.  Patchy airspace disease, right lower lobe and lingula suspicious for early infiltrate.  ADMISSION HISTORY:  [name] is a 45 year old man with past medical history of PUD, GI bleed, hypertension, diabetes, ischemic cardiomyopathy status post ICD with an EF of 20% to 25% present for shortness of breath.  The patient reports having cold symptoms in the past 1 month and tried Macrobid and Ibuprofen without any relief.  He reported having productive cough with yellow green phlegm in the past week.  He also has myalgia and intermittent chest  pain x1 week, which is squeezing in nature, substernal, 8/10 in severity associated with nausea and diaphoresis, but no radiation.  His chest pain is exacerbated with cough episode.  Two nights prior to admission, he had a T-max of 102 at home.  He reports chills, nausea, vomiting, and cannot keep anything down unless he stops to eat or his medication.  He also reports that 3 of his children at home are also sick.  ADMISSION PHYSICAL EXAMINATION:  VITAL SIGNS:  Temperature 98.2, heart rate 106, respiration 18, blood pressure 140/92, O2 sat 95% on room air. GENERAL APPEARANCE:  Alert, cooperative, and appears stated age. HEAD:  Normocephalic without any obvious abnormality, atraumatic. EYES:  Conjunctivae and cornea clear.  Pupil round, equal, reactive to light and accommodation, extraocular movement intact, fundi benign. THROAT:  Lips, mucosa, and tongue normal.  Teeth and gums are normal. NECK:  No adenopathy.  No carotid bruits.  No JVD.  Supple, symmetrical, trachea midline, and thyroid not enlarged. LUNGS:  Diminished breath sounds bibasilar, no wheezes, no crackles. HEART:  Regular rate and rhythm, S1 and S2, no murmur, click, or gallops. EXTREMITIES:  Edema on lower extremity +1 nonpitting. PULSE:  +2 and symmetric. SKIN:  Color, texture, and turgor normal.  No rashes or lesions. NEUROLOGIC:  Grossly normal.  Cranial nerves II through XII grossly intact, 5/5 motor strength, intact sensation, alert and oriented x3.  ADMISSION LABS:  Sodium 136, potassium 3.6, chloride 104, bicarb 22, glucose 135, BUN 9, creatinine 1.19, calcium 8.4.  WBC 11.4, hemoglobin 15.1, hematocrit 43.3, platelets 291, neutrophils 70%, ANC 8.8.  BNP 526.  Point-of-care CK-MB 1.8.  Point-of-care troponin less than 0.05. Myoglobin 311.  HOSPITAL COURSE: 1. Shortness of breath/chest pain likely CHF given his elevated BNP of     526 and clinically volume overloaded on physical exam.  Other     differential  include PE, COPD, and pneumonia.  PE is less likely     given that his shortness of breath can be explained by the elevated     BMP and his Geneva score of 1, which is low risk.  For chest pain,     it is likely  musculoskeletal pleuritic because it only happens when     he coughs.  We also ruled out ACS given his risk factor.  For his     shortness of breath, the patient was given Lasix 80 mg IV daily,     which was then changed to p.o. on day 2 of admission.  We also     continued his Aldactone 25 mg p.o. daily.  We also cycled his     cardiac enzymes x3 with a troponin of 0.07, 0.08, and 0.06.  His     EKG showed some inverted T-waves in V5-V6; however, we did not     consult Cardiology given that his chest pain has resolved and is     more likely secondary to his cough.  Of note, his elevated troponin     could be explained by his mild chronic renal failure as well.  We     also checked hemoglobin A1c and fasting lipid panel for risk     stratification.  His hemoglobin A1c was 7.5.  Lipid panel shows     cholesterol of 183, triglycerides 76, HDL 31, LDL 137.  We also     kept him strict In's and Os's as well as fluid restriction of 1200     mL per day and obtain daily weight.  The patient lost about 1.3 kg     by the time of discharge.  The patient received morphine for pain     control and sublingual nitroglycerin p.r.n. for chest pain.  His     shortness of breath and chest pain resolved on discharge. 2. Cough.  Chest x-ray is consistent with right lower lobe and lingula     suspicious for early infiltrate with a T-max in the ED up to 100.1.     He also had elevated WBC and ANC, the patient received Rocephin and     azithromycin on day 1 which was later switched to azithromycin p.o.     and discontinued Rocephin.  The patient received 3 days total of     antibiotics in the hospital and will continue antibiotics,     azithromycin x4 days as an outpatient.  We were unable to obtain      sputum, Gram-stain, and culture.  He will have followup with Dr.     Reche Dixon as an outpatient and recommended that he will get a repeat     chest x-ray in 4-6 weeks to make sure his resolution of the     infiltrate.  We also checked urinary Legionella and strep, which     were both negative.  His WBC has also trended down to 6.5 at the     time of discharge.  Leukocytosis resolved. 3. Nonischemic cardiomyopathy status post ICD.  His chest pain was     intermittent and ACS was ruled out.  Please see problem #1 for more     detail.  We will continue his Coreg 25 mg p.o. b.i.d. and     benazepril 20 mg p.o. b.i.d. 4. Peptic ulcer disease.  We continue Protonix 40 mg p.o. daily. 5. Diabetes type 2 with hemoglobin A1c of 7.5.  CBG ranged from 70s to     200s.  We continued Lantus 30 unit subcu at bedtime since he did     not tolerate his oral intake well in the beginning of the hospital     course secondary to nausea and vomiting.  However, he was  able to tolerate full diet at the time of     discharge.  We will continue his glipizide 10 mg p.o. daily and     receive SSI sensitive for meal coverage.  We will also check CBG     before every meals and at bedtime.  His PCP might need to readjust     his insulin as an outpatient if his CBGs continued to be in the     lower end. 6. VTE prophylaxis.  The patient received Lovenox 40 mg subcu daily.  DISCHARGE VITALS:  Temperature 97.1, pulse 78, respiration 18, blood pressure 131/85, 99% on room air O2 sats.  DISCHARGE LABS:  BNP 444.  CBC; WBC 6.5, hemoglobin 14.4, hematocrit 41.6, platelets 301.  Sodium 137, potassium 3.6, chloride 108, bicarb 25, glucose 83, BUN 14, creatinine 1.36.    ______________________________ Carrolyn Meiers, MD   ______________________________ Blanch Media, M.D.    MH/MEDQ  D:  04/02/2010  T:  04/03/2010  Job:  [id number]  cc:   Dineen Kid. Reche Dixon, M.D.  Electronically Signed by Carrolyn Meiers MD on 04/06/2010 01:36:39  PM Electronically Signed by Blanch Media M.D. on 04/15/2010 03:01:09 PM

## 2010-04-22 LAB — URINALYSIS, ROUTINE W REFLEX MICROSCOPIC
Bilirubin Urine: NEGATIVE
Leukocytes, UA: NEGATIVE
Nitrite: NEGATIVE
Specific Gravity, Urine: 1.033 — ABNORMAL HIGH (ref 1.005–1.030)
pH: 7 (ref 5.0–8.0)

## 2010-04-22 LAB — DIFFERENTIAL
Basophils Relative: 0 % (ref 0–1)
Eosinophils Absolute: 0.2 10*3/uL (ref 0.0–0.7)
Neutrophils Relative %: 84 % — ABNORMAL HIGH (ref 43–77)

## 2010-04-22 LAB — POCT I-STAT, CHEM 8
Glucose, Bld: 172 mg/dL — ABNORMAL HIGH (ref 70–99)
HCT: 51 % (ref 39.0–52.0)
Hemoglobin: 17.3 g/dL — ABNORMAL HIGH (ref 13.0–17.0)
Potassium: 4 mEq/L (ref 3.5–5.1)
Sodium: 138 mEq/L (ref 135–145)
TCO2: 28 mmol/L (ref 0–100)

## 2010-04-22 LAB — CBC
MCHC: 34.4 g/dL (ref 30.0–36.0)
MCV: 89.7 fL (ref 78.0–100.0)
Platelets: 242 10*3/uL (ref 150–400)

## 2010-04-22 LAB — URINE MICROSCOPIC-ADD ON

## 2010-05-11 ENCOUNTER — Ambulatory Visit: Payer: Medicare Other | Admitting: Internal Medicine

## 2010-05-13 ENCOUNTER — Emergency Department (HOSPITAL_COMMUNITY): Payer: Medicare Other

## 2010-05-13 ENCOUNTER — Inpatient Hospital Stay (HOSPITAL_COMMUNITY)
Admission: EM | Admit: 2010-05-13 | Discharge: 2010-05-15 | DRG: 202 | Disposition: A | Discharge status: Home / Self Care | Payer: Medicare Other | Attending: Internal Medicine | Admitting: Internal Medicine

## 2010-05-13 DIAGNOSIS — Z794 Long term (current) use of insulin: Secondary | ICD-10-CM

## 2010-05-13 DIAGNOSIS — F3289 Other specified depressive episodes: Secondary | ICD-10-CM | POA: Diagnosis present

## 2010-05-13 DIAGNOSIS — I428 Other cardiomyopathies: Secondary | ICD-10-CM | POA: Diagnosis present

## 2010-05-13 DIAGNOSIS — B9789 Other viral agents as the cause of diseases classified elsewhere: Secondary | ICD-10-CM | POA: Diagnosis present

## 2010-05-13 DIAGNOSIS — G4733 Obstructive sleep apnea (adult) (pediatric): Secondary | ICD-10-CM | POA: Diagnosis present

## 2010-05-13 DIAGNOSIS — F329 Major depressive disorder, single episode, unspecified: Secondary | ICD-10-CM | POA: Diagnosis present

## 2010-05-13 DIAGNOSIS — I509 Heart failure, unspecified: Secondary | ICD-10-CM | POA: Diagnosis present

## 2010-05-13 DIAGNOSIS — E119 Type 2 diabetes mellitus without complications: Secondary | ICD-10-CM | POA: Diagnosis present

## 2010-05-13 DIAGNOSIS — Z95810 Presence of automatic (implantable) cardiac defibrillator: Secondary | ICD-10-CM

## 2010-05-13 DIAGNOSIS — J40 Bronchitis, not specified as acute or chronic: Principal | ICD-10-CM | POA: Diagnosis present

## 2010-05-13 DIAGNOSIS — Z8701 Personal history of pneumonia (recurrent): Secondary | ICD-10-CM

## 2010-05-13 DIAGNOSIS — Z8711 Personal history of peptic ulcer disease: Secondary | ICD-10-CM

## 2010-05-13 DIAGNOSIS — I10 Essential (primary) hypertension: Secondary | ICD-10-CM | POA: Diagnosis present

## 2010-05-13 DIAGNOSIS — Z79899 Other long term (current) drug therapy: Secondary | ICD-10-CM

## 2010-05-13 DIAGNOSIS — I5022 Chronic systolic (congestive) heart failure: Secondary | ICD-10-CM | POA: Diagnosis present

## 2010-05-13 DIAGNOSIS — F1011 Alcohol abuse, in remission: Secondary | ICD-10-CM | POA: Diagnosis present

## 2010-05-13 DIAGNOSIS — J069 Acute upper respiratory infection, unspecified: Secondary | ICD-10-CM | POA: Diagnosis present

## 2010-05-13 LAB — CBC
HCT: 48.2 % (ref 39.0–52.0)
RDW: 14.2 % (ref 11.5–15.5)
WBC: 8.7 10*3/uL (ref 4.0–10.5)

## 2010-05-13 LAB — BASIC METABOLIC PANEL
BUN: 12 mg/dL (ref 6–23)
CO2: 22 mEq/L (ref 19–32)
Calcium: 8.9 mg/dL (ref 8.4–10.5)
Creatinine, Ser: 1.35 mg/dL (ref 0.4–1.5)
GFR calc non Af Amer: 58 mL/min — ABNORMAL LOW (ref >60)
Glucose, Bld: 151 mg/dL — ABNORMAL HIGH (ref 70–99)
Sodium: 138 mEq/L (ref 135–145)

## 2010-05-13 LAB — DIFFERENTIAL
Basophils Absolute: 0 10*3/uL (ref 0.0–0.1)
Basophils Relative: 0 % (ref 0–1)
Eosinophils Relative: 0 % (ref 0–5)
Lymphocytes Relative: 7 % — ABNORMAL LOW (ref 12–46)
Neutro Abs: 7.7 10*3/uL (ref 1.7–7.7)

## 2010-05-13 LAB — POCT CARDIAC MARKERS
CKMB, poc: 1.5 ng/mL (ref 1.0–8.0)
Myoglobin, poc: 174 ng/mL (ref 12–200)

## 2010-05-14 LAB — BASIC METABOLIC PANEL
BUN: 15 mg/dL (ref 6–23)
Chloride: 105 mEq/L (ref 96–112)
Creatinine, Ser: 1.6 mg/dL — ABNORMAL HIGH (ref 0.4–1.5)
Glucose, Bld: 154 mg/dL — ABNORMAL HIGH (ref 70–99)

## 2010-05-14 LAB — COMPREHENSIVE METABOLIC PANEL
ALT: 29 U/L (ref 0–53)
Alkaline Phosphatase: 93 U/L (ref 39–117)
BUN: 14 mg/dL (ref 6–23)
Chloride: 107 mEq/L (ref 96–112)
Glucose, Bld: 198 mg/dL — ABNORMAL HIGH (ref 70–99)
Potassium: 5.3 mEq/L — ABNORMAL HIGH (ref 3.5–5.1)
Total Bilirubin: 2.1 mg/dL — ABNORMAL HIGH (ref 0.3–1.2)

## 2010-05-14 LAB — GLUCOSE, CAPILLARY
Glucose-Capillary: 157 mg/dL — ABNORMAL HIGH (ref 70–99)
Glucose-Capillary: 135 mg/dL — ABNORMAL HIGH (ref 70–99)

## 2010-05-14 LAB — CBC
HCT: 47.6 % (ref 39.0–52.0)
Hemoglobin: 15.9 g/dL (ref 13.0–17.0)
MCV: 84.7 fL (ref 78.0–100.0)
RBC: 5.62 MIL/uL (ref 4.22–5.81)
WBC: 11.4 10*3/uL — ABNORMAL HIGH (ref 4.0–10.5)

## 2010-05-14 LAB — INFLUENZA PANEL BY PCR (TYPE A & B)
H1N1 flu by pcr: NOT DETECTED
Influenza B By PCR: NEGATIVE

## 2010-05-14 LAB — HEMOGLOBIN A1C: Hgb A1c MFr Bld: 7.4 % — ABNORMAL HIGH (ref <5.7)

## 2010-05-14 LAB — MAGNESIUM: Magnesium: 1.6 mg/dL (ref 1.5–2.5)

## 2010-05-15 LAB — BASIC METABOLIC PANEL
CO2: 26 mEq/L (ref 19–32)
Chloride: 105 mEq/L (ref 96–112)
Glucose, Bld: 116 mg/dL — ABNORMAL HIGH (ref 70–99)
Potassium: 4.2 mEq/L (ref 3.5–5.1)
Sodium: 138 mEq/L (ref 135–145)

## 2010-05-15 LAB — DIFFERENTIAL
Basophils Absolute: 0 10*3/uL (ref 0.0–0.1)
Lymphocytes Relative: 23 % (ref 12–46)
Neutro Abs: 3.8 10*3/uL (ref 1.7–7.7)

## 2010-05-15 LAB — CBC
HCT: 46.5 % (ref 39.0–52.0)
Hemoglobin: 15.5 g/dL (ref 13.0–17.0)
WBC: 6.5 10*3/uL (ref 4.0–10.5)

## 2010-05-15 LAB — GLUCOSE, CAPILLARY: Glucose-Capillary: 82 mg/dL (ref 70–99)

## 2010-05-15 LAB — SODIUM, URINE, RANDOM: Sodium, Ur: 78 mEq/L

## 2010-05-16 NOTE — H&P (Signed)
[name], [name] NO.:  [id number]  MEDICAL RECORD NO.:  [id number]           PATIENT TYPE:  I  LOCATION:  1401                         FACILITY:  [facility]  PHYSICIAN:  Vania Rea, M.D. DATE OF BIRTH:  10-Mar-1965  DATE OF ADMISSION:  05/13/2010 DATE OF DISCHARGE:                             HISTORY & PHYSICAL   PRIMARY CARE PHYSICIAN:  The [facility] Teaching Service with Dr. Reche Dixon.  Note, this patient is being admitted by the hospitalist service because it remains unclear as to whether this patient is fully established with the [facility] Teaching Service.  CHIEF COMPLAINT:  Generalized body aches, cough, and runny nose for the past few days.  HISTORY OF PRESENT ILLNESS:  This is a 45 year old morbidly obese, African-American gentleman with history of diabetes, hypertension, and chronic systolic heart failure, who presents to the emergency room worried that he may be having yet another bout of pneumonia.  For the past 3 days, patient has been having fever and chills, nonproductive cough, generalized body aches, runny nose.  In fact, patient lives with his three children and all four them having the same problem.  He planned to take them all to the doctor this morning.  however, he felt so sick that he left them for their mother to take  to the doctor and he came to the emergency room to be evaluated.  In the emergency room, the impression was obtained that the patient had a pneumonia; he was started on intravenous antibiotics and the hospitalist service was called to assist with management.  The patient has no worsening lower extremity edema.  He has no worsening shortness of breath.  He had no diarrhea. He has been having paroxysms of coughing which end vomiting.  He received both his pneumonia vaccine  and his flu vaccine in December 2011.    PAST MEDICAL HISTORY: 1. Chronic systolic heart failure with ejection fraction of 20%,     status  post AICD placement. 2. Hypertension. 3. Diabetes. 4. History of heavy alcohol abuse, abstinent for the past 3 years. 5. Peptic ulcer disease. 6. Obstructive sleep apnea, managed with nocturnal oxygen. 7. Status post knee surgery. 8. History of recurrent pneumonias. 9. Depression.  MEDICATIONS:  Include, 1. Aldactone 25 mg daily. 2. Lasix 80 mg twice daily. 3. Zocor 40 mg at bedtime. 4. Sublingual nitroglycerin as needed. 5. Lantus 20 units at bedtime. 6. Carvedilol 25 mg twice daily. 7. Benazepril 20 mg twice daily. 8. Fluoxetine 20 mg daily. 9. Cyclobenzaprine 10 mg at bedtime.  ALLERGIES: 1. SEAFOOD. 2. SHELLFISH. 3. CONTRAST MEDIA.  SOCIAL HISTORY:  Denies any history of tobacco use, used to have a long history of drinking two to three cases of beers per day but has been abstinent for the past 3 to 4 years.  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  FAMILY HISTORY:  Significant for diabetes, hypertension, coronary artery disease, and sarcoidosis in his mother.  REVIEW OF SYSTEMS:  Other than noted above unremarkable.  PHYSICAL EXAMINATION:  GENERAL:  An ill-looking  young African-American gentleman, sitting up in the stretcher. VITAL SIGNS:  Temperature is 99.6, his pulses are 114, respirations 18, blood pressure 171/90.  He is saturating at 93% on 2 L. HEENT:  His pupils are round and equal.  Mucous membranes pink. Anicteric. NECK:  No cervical lymphadenopathy.  Has a thick neck.  No thyromegaly or carotid bruits. CHEST:  He has occasional rhonchi bilaterally. CARDIOVASCULAR SYSTEM:  He is tachycardic.  No murmur heard.  He has an AICD implanted in the left upper chest. ABDOMEN:  Obese but soft.  There is no tenderness. EXTREMITIES:  He has trace edema bilaterally. SKIN:  Warm and moist with sweat. CENTRAL NERVOUS SYSTEM:  Cranial nerves II through XII are grossly intact.  He has no focal lateralizing  signs.  LABORATORY DATA:  His white count is 8.7, hemoglobin 16.6, platelets 234,000.  He has 89% neutrophils, absolute neutrophil count is on the low side of 0.6.  His sodium is 138, potassium 3.7, chloride 108, CO2 22, glucose 151, BUN 12, creatinine 1.35, calcium 8.9.  His beta type natriuretic peptide is 145 which is pretty much his baseline.  His troponins undetectable, myoglobin is normal at 174, CK-MB is 1.5.  Chest x-ray two-view shows chronic appearing vascular congestion and bronchitic change without focal infiltrate or overt edema.  The CT scan of the chest without contrast shows patchy ground-glass opacities in the lower lung lobes, suggesting edema or alveolitis, left lower lung bronchitis and atelectasis.  Borderline enlarged mediastinal hilar lymph nodes may be reactive change.  ASSESSMENT: 1. Acute viral upper respiratory tract infection, possibly late flu,     possible viral pneumonia. 2. Diabetes type 2, controlled. 3. Hypertension. 4. Nonischemic cardiomyopathy. 5. Chronic systolic heart failure. 6. Morbid obesity. 7. Obstructive sleep apnea. 8. History of peptic ulcer disease. 9. Depression.  PLAN: 1. Because of this patient's multiple comorbidities, will admit him     for symptom management, will give decongestants and pain medication     and will continue his home medications with the exception that we     will hold his Lasix for tonight in lieu of hydrating him, because of     his low ejection fraction. Will also continue his Lantus but will     hold his glipizide as his food intake likely be decreased because     of his anorexia with his flu-like illness. 2. If his respiratory status should deteriorate or he should spike a     temperature, will consider treatment for staph pneumonia. 3. Other plans as per orders.     Vania Rea, M.D.     LC/MEDQ  D:  05/13/2010  T:  05/13/2010  Job:  [id number]  cc:   Concepcion Living Fax:  [phone]  [facility] Teaching Service  Electronically Signed by Vania Rea M.D. on 05/16/2010 02:28:01 AM

## 2010-05-27 NOTE — Discharge Summary (Signed)
[name], [name] NO.:  [id number]  MEDICAL RECORD NO.:  [id number]           PATIENT TYPE:  I  LOCATION:  1401                         FACILITY:  [facility]  PHYSICIAN:  Ramiro Harvest, MD    DATE OF BIRTH:  February 22, 1965  DATE OF ADMISSION:  05/13/2010 DATE OF DISCHARGE:  05/15/2010                        DISCHARGE SUMMARY - REFERRING   PRIMARY CARE PHYSICIAN:  Dr. Linton Rump.  DISCHARGE DIAGNOSES: 1. Viral upper respiratory tract infection versus bronchitis,     improved. 2. Type 2 diabetes, hemoglobin A1c 7.4. 3. Hypertension. 4. History of nonischemic cardiomyopathy. 5. Chronic systolic heart failure, stable/compensated. 6. History of peptic ulcer disease. 7. Depression. 8. Obstructive sleep apnea. 9. Morbid obesity. 10.Status post AICD placement, EF of 20%. 11.History of heavy alcohol abuse, abstinent x3 years. 12.Status post knee surgery. 13.History of recurrent pneumonia.  DISCHARGE MEDICATIONS: 1. Albuterol inhaler 2 puffs, three times daily times 4 days, then     every 4 hours as needed. 2. Levaquin 500 mg p.o. daily times 6 days. 3. Tessalon Perles 200 mg p.o. t.i.d. times 6 days. 4. Zofran 4 mg p.o. q.4 h p.r.n. 5. Aldactone 25 mg p.o. daily. 6. Benazepril 20 mg p.o. b.i.d. 7. Coreg 25 mg p.o. b.i.d. 8. Flexeril 10 mg p.o. q.h.s. 9. Prozac 20 mg p.o. daily. 10.Glipizide 10 mg p.o. b.i.d. 11.Lantus 20 units subcutaneously q.h.s. 12.Lasix 80 mg p.o. b.i.d. 13.Nitroglycerin sublingual 0.4 mg sublingual every 5 minutes x3     p.r.n. 14.Zocor 40 mg p.o. q.h.s.  DISPOSITION AND FOLLOWUP:  The patient will be discharged home.  The patient is to follow up with PCP in 1 week and followup BMET will need to be checked, and followup on patient's electrolytes and renal function, and his hospitalization for viral upper respiratory tract infection versus bronchitis will need to be reassessed at that time.  CONSULTATIONS DONE:  None.  PROCEDURES  PERFORMED: 1. A chest x-ray was done, May 13, 2010 that showed chronic-     appearing vascular congestion and bronchitic change without focal     infiltrate or overt pulmonary edema. 2. CT of the chest done, May 13, 2010 shows patchy ground-glass     opacity in the lower lung zones, suggesting edema or alveolitis,     left lower lobe bronchitis, and atelectasis, borderline enlarged     mediastinal, and hilar lymph nodes may be a reactive change.  BRIEF ADMISSION HISTORY AND PHYSICAL:  [name] is a 45 year old morbidly obese African-American gentleman with history of diabetes, hypertension, chronic systolic heart failure, who presented to the ED worried he may be getting he had another bout of pneumonia.  For the past 3 days, patient has been having fever and chills, nonproductive cough, generalized body aches, runny nose.  In fact, patient lives with his three shoulder and all four of them have been having the same issues.  He planned to take them all to the doctor on the morning of admission; however, he felt too sick and he left them with her mother to take them to the doctor and presented to the ED to be evaluated.  In  the emergency room, impression which was obtained from the admitting physician was that patient may have had a pneumonia.  He was started on IV antibiotics and hospitalist service was called to assist in management.  The patient has no worsening lower extremity edema, no worsening shortness of breath, had been having paroxysms of coughing which ended with emesis.  He received both his pneumonia vaccine and flu vaccine in December 2011.  He denies any diarrhea.  For the rest of admission history and physical, please see H&P dictated per Dr. Orvan Falconer of job [phone].  HOSPITAL COURSE: 1. Viral upper respiratory tract infection versus bronchitis.  The     patient was admitted for symptom of management, he was placed on     decongestants and pain medications.   He was also placed on his home     regimen as well.  The patient was monitored and his Lasix were     initially held for the first day.  Influenza A and B were obtained     which came back negative.  CT of the chest which was obtained as     results as stated above was negative for any overt edema or     infiltrate, but was consistent with a left lower lobe bronchitis     and atelectasis.  The patient was started on oral Levaquin which     patient tolerated well.  The patient improved clinically.  During     the hospitalization, he remained afebrile with a normal white     count.  The patient will be discharged home on oral Levaquin for 6     more days to complete a 7-day course of antibiotic therapy.  He     also be discharged home on [facility] as well as an albuterol     inhaler on as needed.  The patient will follow up with PCP in 1     week as stated above.  The patient will be discharged in a stable     and improved condition.  The rest of patient's chronic medical     issues remained stable throughout the hospitalization and patient     was discharged in stable condition.  On day of discharge, vital     signs, temperature 98.0, pulse of 71, respirations 18, blood     pressure 113/79, satting 96% on room air.  DISCHARGE LABS:  Magnesium of 2.1.  BMET; sodium 138, potassium 4.2, chloride 105, bicarb 26, glucose 116, BUN 20, creatinine 1.69, and calcium of 8.3.  CBC with a white count of 6.5, hemoglobin 15.5, hematocrit 46.5, and a platelet count of 214,000 with ANC of 3.8.  It has been a pleasure to taking care of [name].     Ramiro Harvest, MD     DT/MEDQ  D:  05/15/2010  T:  05/15/2010  Job:  [id number]  cc:   Dr. Linton Rump  Electronically Signed by Ramiro Harvest MD on 05/27/2010 02:37:13 PM

## 2010-06-02 NOTE — Discharge Summary (Signed)
NAMERECE, ZECHMAN NO.:  [id number]   MEDICAL RECORD NO.:  [id number]          PATIENT TYPE:  OIB   LOCATION:  2019                         FACILITY:  [facility]   PHYSICIAN:  Maple Mirza, PA   DATE OF BIRTH:  Mar 26, 1965   DATE OF ADMISSION:  05/08/2007  DATE OF DISCHARGE:  05/10/2007                               DISCHARGE SUMMARY   ADDENDUM   This patient has allergies to IV CONTRAST and SHELLFISH which caused  anaphylaxis in the past.  He is now day 2 as status post implant of a  St. Jude single-chamber cardioverter defibrillator with Dr. Ladona Ridgel.  On  post procedure day #1, he received Tylenol #3.  This made him lethargic  and somnolent.  Later in the day, he actually started to vomit.  We kept  him 1 day over during the day; however, he was seen by the internal  medicine service and they recommended that he start on subcutaneous  insulin starting on Lantus 10 units and instructed him.  This patient  also has home health coming to help with his diabetes management when he  leaves and he will followup with the [facility] at [facility] and they will call for that appointment.  He already has  appointments with [place] and detailed discharge delineating mobility of his  left arm and incision care and also delineating the medication list that  he is going to take to go home on.  He will have prescriptions for all  medications that we are prescribing.      Maple Mirza, PA     GM/MEDQ  D:  05/10/2007  T:  05/10/2007  Job:  [id number]   cc:   Raynelle Fanning A. Mayford Knife, M.D.  Doylene Canning. Ladona Ridgel, MD  [facility]

## 2010-06-02 NOTE — Assessment & Plan Note (Signed)
[facility]                          CHRONIC HEART FAILURE NOTE   [name], [name]                       MRN:          [id number]  DATE:12/19/2006                            DOB:          January 01, 1966    PRIMARY CARE PHYSICIAN:  The patient is followed by [facility].   PRIMARY CARDIOLOGIST:  Madolyn Frieze. Jens Som, MD, [facility]   HISTORY OF PRESENT ILLNESS:  [name] is a very pleasant 45 year old  African American gentleman who was just recently discharged from [facility] where he was admitted and treated for acute on chronic  congestive heart failure in the setting of nonischemic cardiomyopathy  with an EF of 15-20%.  He also experienced episodes of nonsustained  VTACH, was consulted on by Dr. Sherryl Manges who recommended further  titration of medication and then reevaluation at that time.  [name]  underwent a cardiac catheterization during hospitalization.  He was  found to have no significant obstructive CAD within the major epicardial  vessels.  He did have mild luminal irregularity and mild 40% stenosis of  the circumflex.  He was found to have a pulmonary artery systolic  pressure of 50 mmHg with a main pulmonary capillary wedge pressure of 27  mmHg.  It was felt that the patient's heart failure was most likely  secondary to poor management of hypertension in the setting of  noncompliance with financial concerns.  [name] also has a history of  ETOH use in the past, although, he states this has not been a problem  for him.  He denies any history of drug use.  He has no family history  of coronary artery disease on both sides.  He is accompanied by his wife  today and two small children.  They live in public housing in  [place].  He states they live in an apartment off [address] on the  second floor.  They are currently pending a move to a first floor  apartment as he becomes extremely short of breath trying to find a  flight  of steps to his apartment.  [name] cannot read of write.  He  relies on his wife for assistance.  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  He  also has been diagnosed with diabetes.  He does not have a Glucometer or  means to check his CBG at home now.  He is pending assistance with  [facility] for a Glucometer.  He had filled his prescription  medications when he was discharged home through [facility], is pending  further assistance through [facility] for his medications also.  He  states as he was discharged home he continues to feel fatigued with  generalized weakness.  He is dyspneic with minimal exertion, restless at  night, states he does not sleep well.  He has the sensation of fullness  in his chest and abdomen still, occasional dizziness when he stands too  quickly.   PAST MEDICAL  HISTORY:  1. Acute on chronic congestive heart failure secondary to nonischemic      cardiomyopathy with EF of 15-20%.  2. Diabetes.  3. Hypertension.  4. Nonsustained VTACH.  5. Generalized anxiety.  6. Functionally illiterate.  7. Health illiteracy.  8. Social economic issues.  9. History of ETOH abuse.   REVIEW OF SYSTEMS:  As stated above.   CURRENT MEDICATIONS:  1. Spironolactone 25 mg daily.  2. Paxil 10 mg daily.  3. Benazepril 10 mg two tablets daily.  4. Carvedilol 12.5 mg b.i.d.  5. Pravastatin 40 mg daily.  6. Furosemide 40 mg b.i.d.  7. Glipizide 10 mg daily.  8. Digoxin 0.25 mg daily.  9. P.r.n. medications include Fioricet for headaches.   PHYSICAL EXAMINATION:  VITAL SIGNS:  Weight 254 pounds, blood pressure  124/94, repeat blood pressure 126/96 with a heart rate of 96.  GENERAL:  [name] is in no acute distress.  He as jugular venous  distention 8-10 cm at a 45 degree angle.  LUNGS:  Clear to auscultation.  CARDIOVASCULAR:  S1, S2.   Regular rate and rhythm.  ABDOMEN:  Soft, nontender, positive bowel sounds, obese.  EXTREMITIES:  Lower extremities without cyanosis, clubbing or edema.  NEUROLOGICAL:  Alert and oriented x3.   LABORATORY DATA:  Most recent laboratory work as of November 20,  potassium 4.1, BUN and creatinine 12 and 1.39, last BNP noted was  November 18 at 614.   IMPRESSION:  Congestive heart failure secondary to nonischemic  cardiomyopathy with an EF of 25-30%.  The patient with signs of jugular  venous distention at this time, still complaining of class III symptoms.  I am going to increase his Furosemide to 80 mg in the a.m.  Continue 40  mg at 3 p.m.  Increase his Carvedilol to 25 mg b.i.d. and see Bryker back  in four weeks.  Will repeat lab work at this time.  I have initiated  heart failure education with him and his wife.  His wife has educational  packet.  Unfortunately, they cannot afford to get bathroom scales at  this time for him to weigh daily.  I have reviewed the signs and  symptoms of volume overload with [name] and his wife.  They are to  be very observant and in tune to any physical changes or symptoms.  I  encouraged him to establish care with [facility] for medication  assistance and diabetic management, and I will only refocus on his heart  failure management.  Office visit greater than 40 minutes.   Also note, [name] does not currently have a phone.  He has given me  permission to leave confidential information with Berneda Rose, his  aunt, her phone number is [phone].  Okay to leave information with her,  and she will contact [name] or his wife for further instructions.      Dorian Pod, ACNP  Electronically Signed      Bevelyn Buckles. Bensimhon, MD  Electronically Signed   MB/MedQ  DD: 12/19/2006  DT: 12/20/2006  Job #: [id number]   cc:   Melvern Banker

## 2010-06-02 NOTE — Cardiovascular Report (Signed)
[name], [name] NO.:  [id number]   MEDICAL RECORD NO.:  [id number]          PATIENT TYPE:  INP   LOCATION:  3738                         FACILITY:  [facility]   PHYSICIAN:  Jonelle Sidle, MD DATE OF BIRTH:  1965/08/17   DATE OF PROCEDURE:  DATE OF DISCHARGE:                            CARDIAC CATHETERIZATION   REQUESTING CARDIOLOGIST:  Dr. Olga Millers.   INDICATION:  [name] is a 45 year old male with history of obesity,  hyperlipidemia, type 2 diabetes, mellitus, hypertension and  cardiomyopathy with an ejection fraction of 15% to 20% and recently-  documented, nonsustained ventricular tachycardia.  He is referred now  for a diagnostic cardiac catheterization to assess hemodynamics in the  coronary anatomy.  The potential risks and benefits were explained to  him in advance, and informed consent was obtained.   PROCEDURES PERFORMED:  1. Left heart catheterization.  2. Right heart catheterization.  3. Selective coronary angiography.   ACCESS AND EQUIPMENT:  The area about the right femoral artery was  anesthetized with 1% lidocaine, and a 6-French sheath was placed in the  right femoral artery, via the modified Seldinger technique.  A 7-French  sheath was placed in the right femoral vein, via the modified Seldinger  technique.  A 7-French balloon-tipped, flow-directed catheter was used  for right heart catheterization and hemodynamic assessment.  A standard  pre-formed 6-French JL-4 and JR-4 catheters were used for selective  coronary angiography, and an angled pigtail catheter was used for left  heart catheterization and left ventriculography.  All exchanges were  made over a wire with the exception of the right heart catheter.  A  total of 85 mL Omnipaque were used.  The patient tolerated the procedure  well, without immediate complications.   HEMODYNAMICS:  Right atrium:  Mean of 13, right ventricle 47/6.  Pulmonary artery 50/27 with a mean  of 38.  Pulmonary capillary wedge  pressure mean of 27, aorta 111/77, left ventricle 113/25.  Cardiac  output 6.6 by the thermodilution method, cardiac index 2.8 by the  thermodilution method.  Arterial saturation 93%.  Pulmonary artery  saturation 66%.   ANGIOGRAPHIC FINDINGS:  1. Left main coronary artery gives rise to the left anterior      descending and circumflex vessels.  No significant flow-limiting      coronary atherosclerosis is noted.  2. Left anterior descending is a large-caliber vessel that wraps      around the apex.  There are four diagonal branches noted.  Minor      luminal irregularities are seen, without any flow-limiting      stenosis.  3. The circumflex vessel is a large-caliber vessel, which gives rise      to a medium-size AV groove branch and larger caliber obtuse      marginal that branches.  There are minor luminal irregularities      noted including approximately 40% stenosis within the proximal to      mid-portion of the AV groove branch, which may be anatomical at a      bend.  No significant flow-limiting coronary  artery disease is      noted.  4. The right coronary artery is a large-caliber dominant vessel with      branching posterolateral and posterior descending arteries. Minor      luminal irregularities are noted without flow-limiting stenosis.   Left ventriculography was not performed.   DIAGNOSES:  1. No significant obstructive coronary artery disease noted within the      major epicardial vessels.  There are minor luminal irregularities.      There is a 40% stenosis of the circumflex as outlined, although      this may be anatomical, noted at portion with vessel bends.  2. Pulmonary artery systolic pressure of 50 mmHg with a mean pulmonary      capillary wedge pressure of 27 mmHg.  The cardiac index is normal      at 2.8, and the pulmonary artery saturation is 66%.   DISCUSSION:  I reviewed the results with the patient and his family.   These findings would be more indicative of a non-ischemic  cardiomyopathy, in which case medical therapy should be pursued.  Further electrophysiology consultation is also planned, based on Dr.  Ludwig Clarks note.      Jonelle Sidle, MD  Electronically Signed     SGM/MEDQ  D:  12/05/2006  T:  12/05/2006  Job:  [id number]   cc:   Madolyn Frieze. Jens Som, MD, [facility]
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
## 2010-06-02 NOTE — Assessment & Plan Note (Signed)
[facility]                          CHRONIC HEART FAILURE NOTE   [name], [name]                       MRN:          [id number]  DATE:02/20/2007                            DOB:          December 09, 1965    PRIMARY CARE PHYSICIAN:  The patient is followed by Dala Dock on  [address]   PRIMARY CARDIOLOGIST:  Madolyn Frieze. Jens Som, MD, [facility]   [name] returns today for further follow-up of his congestive heart  failure which is secondary to nonischemic cardiomyopathy with EF  currently 15-20%. I last saw [name] back on January 5, at which time  I found his heart failure to be stable, but was concerned that the  patient had pneumonia.  I referred him to [facility] for  further evaluation.  He states he was treated for atypical pneumonia and  was placed on Levaquin and cough syrup and he states he finally got  better and at that has resolved.  He has other complaints at this time.   Since he was on antibiotic therapy, he states he has had some diarrhea.  He apparently has a follow-up appointment [facility] on February 10.  He is also complaining of increased fatigue and increase in depression  symptoms.  He denies any suicidal ideation or tendencies.  He states he  mentioned this to [facility]. His Paxil was stopped and he was started  on Zoloft 50 mg daily.  He cannot tell any improvement on this, but he  has only been on it for a few weeks. He denies any fever or chills, pre-  syncope, syncopal episodes, or palpitations.  He denies any nausea or  vomiting or abdominal discomfort, just the diarrhea.  He states this is  a clear liquid and the increased fatigue and increased sleeping pattern.  He states he has been very stressed out because he cannot work and is  not sure what is going to happen in caring for his family.  He is still  waiting to hear something about his disability.  He states he has a  followup appointment with  disability people on February 13.   PAST MEDICAL HISTORY:  1. Acute on chronic congestive heart failure secondary to nonischemic      cardiomyopathy with ejection fraction 15-20% by recent echo.  2. Diabetes,  followed by [facility].  3. Hypertension.  4. History of nonsustained V tach.  5. Generalized anxiety, depression.  6. Functionally illiterate.  7. Health illiteracy.  8. Socioeconomic issues.  9. History of EtOH abuse.   REVIEW OF SYSTEMS:  As stated above.   CURRENT MEDICATIONS:  1. Include spironolactone 25 mg daily.  2. Pravastatin 40 mg at bedtime.  3. Glipizide 10 mg daily.  4. Digoxin 0.25 mg daily.  5. Furosemide 80 in the morning and 40 in the evening.  6. Carvedilol 25 mg b.i.d.  7. Benazepril 20 mg b.i.d.  8. Sertraline 50 mg daily.   PHYSICAL EXAM:  Weight 256 pounds, blood pressure 146/92 with a heart  rate of 74.  [name] is in no acute distress. No  signs of jugular  vein distention at 45 degrees angle.  LUNGS:  Clear to auscultation bilaterally today.  CARDIOVASCULAR: Exam reveals S1-S2 regular rate and rhythm.  ABDOMEN:  Obese, soft, nontender, positive bowel sounds.  LOWER EXTREMITIES:  Without clubbing, cyanosis or edema.  Neurologically alert and oriented x3; ambulating in clinic without  assistance.   IMPRESSION:  1. Congestive heart failure without signs of volume overload; Class II      New York Heart Association symptoms. Will increase his      spironolactone to 25 mg b.i.d., continue his carvedilol 25 mg      b.i.d. for now, and will plan on checking blood work on him on      Monday or Tuesday with the increase in spironolactone.  2. I have had a long talk with him about his depression and increased      stress and I am going to go ahead and increase his Zoloft to 100 mg      daily.  3. The patient complained with diarrhea, status post recent antibiotic      therapy with the Cipro and then with Levaquin and we are going to      give him  10 days of Flagyl and if this is does not resolve. He is      understands he needs to follow up with [facility] for further      evaluation. Will plan on repeating the patient's echocardiogram      within the next month or so.      Dorian Pod, ACNP  Electronically Signed      Rollene Rotunda, MD, [facility]  Electronically Signed   MB/MedQ  DD: 02/20/2007  DT: 02/20/2007  Job #: [id number]

## 2010-06-02 NOTE — Assessment & Plan Note (Signed)
[facility]                                 ON-CALL NOTE   [name], [name]                       MRN:          [id number]  DATE:01/23/2007                            DOB:          June 21, 1965    PRIMARY CARE PHYSICIAN:  [facility].   PRIMARY CARDIOLOGIST:  Dr. Olga Millers.   SUMMARY OF HISTORY:  At approximately 19:10 I received an alpha page  from Noralee Chars in regards to [employer].  Apparently he was  seen by Dorian Pod in the office on January 23, 2007 and was  referred to the emergency room for possible admission for pneumonia.  According to Ms. Hoogendoorn the patient was told that he will be admitted to  the hospital and a bed would be ready.   I explained Ms. Maalouf that Dorian Pod had conferred with me  earlier and stated that from a heart standpoint he did not have any  active issues and did not need to be admitted from a cardiology  standpoint, but given his continued fever, cough and recent treatment  for pneumonia of which he had not completed his antibiotic course,  Dorian Pod, Nurse Practitioner, felt that he should be evaluated in  the ER for possible admission and that the ER would assist in  determination if he should be admitted and further treated and referred  to the appropriate physician for possible admission.  I explained to her  that if she had questions about his possible admission that she should  contact the emergency room.  I offered her the emergency room's phone  number but she stated that she had it.  She stated that she would call  to find out what the status was of Mr. Ander.      Joellyn Rued, PA-C  Electronically Signed      Pricilla Riffle, MD, [facility]  Electronically Signed   EW/MedQ  DD: 01/24/2007  DT: 01/24/2007  Job #: [id number]   cc:   Madolyn Frieze. Jens Som, MD, [facility]

## 2010-06-02 NOTE — Consult Note (Signed)
[name], [name]                ACCOUNT NO.:  [id number]   MEDICAL RECORD NO.:  [id number]          PATIENT TYPE:  INP   LOCATION:  1430                         FACILITY[facility]  [facility]   PHYSICIAN:  Shirley Friar, MDDATE OF BIRTH:  April 28, 1965   DATE OF CONSULTATION:  09/30/2007  DATE OF DISCHARGE:                                 CONSULTATION   REASON FOR CONSULTATION:  GI consults due to GI bleed, hematemesis.   REQUESTING PHYSICIAN:  Dr. Christella Noa.   HISTORY OF PRESENT ILLNESS:  [name] is a 45 year old black male with  history of congestive heart failure status post ICD, diabetes mellitus,  morbid obesity who states he was in his usual state of health until he  started having acute onset of vomiting after eating a pork chop.  He  vomited several times and then after vomiting several times he started  having some black colored vomitus.  He denied any bright red blood in  his vomiting.  He has been able to keep the saliva down and has been  able to keep liquids down since the vomiting episode, although he has  not received any liquids since coming in to the emergency room.  He does  take multiple doses of Advil daily due to chronic headaches/migraines.  He gives a chronic history of black stools.  He reports recently having  severe heartburn that has been unrelieved with TUMS.   PAST MEDICAL HISTORY:  1. Congestive heart failure.  2. Status post defibrillator placement.  3. Diabetes mellitus type 1.  4. Morbid obesity.  5. Obstructive sleep apnea.  6. NSAID abuse as stated above.   HOME MEDICATIONS:  Glucotrol, aspirin, Toprol, Lasix, benazepril, Coreg,  digoxin, glipizide, paroxetine, spironolactone, Zoloft, Lipitor, Lantus.   ALLERGIES:  No known drug allergies.   FAMILY HISTORY:  Noncontributory.   SOCIAL HISTORY:  Denies alcohol.   REVIEW OF SYSTEMS:  Negative as stated above.   PHYSICAL EXAMINATION:  VITAL SIGNS:  Temperature 96.6, pulse 91, blood  pressure  137/80.  GENERAL:  Alert, no acute distress.  ABDOMEN:  Soft, nontender, nondistended.  Active bowel sounds, obese.   LABORATORY DATA:  Hemoglobin 17.7, white blood count 9.2, platelet count  278, INR 0.9, BUN 11, creatinine 1.2, lipase 39.   IMPRESSION:  A 45 year old black male with several episodes of coffee-  ground emesis that occurred following episodes of vomiting shortly after  eating pork chop.  He does use NSAIDS chronically and may have a peptic  ulcer as a source of his coffee-ground emesis.  He also could have a  Mallory-Weiss tear versus erosive esophagitis.  He needs to get on IV  Protonix 40 mg IV q.12 h.  Would do clear liquid diet and p.o. after  midnight.  Plan to do upper endoscopy in the morning.  Check peptic  ulcer disease or Mallory-Weiss tear or other etiology.  I discussed  risks, benefits of upper endoscopy.  He agrees to proceed.      Shirley Friar, MD  Electronically Signed     VCS/MEDQ  D:  09/30/2007  T:  10/01/2007  Job:  [id number]

## 2010-06-02 NOTE — Discharge Summary (Signed)
NAMEHAYDYN, [name] NO.:  [id number]   MEDICAL RECORD NO.:  [id number]          PATIENT TYPE:  OBV   LOCATION:  2019                         FACILITY:  [facility]   PHYSICIAN:  Doylene Canning. Ladona Ridgel, MD    DATE OF BIRTH:  1965-02-02   DATE OF ADMISSION:  05/08/2007  DATE OF DISCHARGE:  05/09/2007                               DISCHARGE SUMMARY   ALLERGIES:  1. IV CONTRAST.  2. SHELLFISH.  Shellfish makes his throat swell.   FINAL DIAGNOSES:  1. Discharged on day 1, status post implant of St. Jude CURRENT      __________ single-chamber cardioverter defibrillator.      Defibrillator threshold is less than or equal to 15 joules.  2. Prior to discharge, the patient had internal medicine consult for      upgrading diabetes care.  They recommended Lantus 10 units daily      and close follow up with [facility].   SECONDARY DIAGNOSES:  1. Nonischemic cardiomyopathy, ejection fraction 25-30% by      echocardiogram April 04, 2007.  2. New York Heart Association class II-III chronic systolic congestive      heart failure.  3. Diabetes.  4. Hypertension.  5. Anxiety/depression.  6. Nonsustained ventricular tachycardia.  7. Obesity.  8. Dyslipidemia.   PROCEDURE:  On May 08, 2007:  Implant St. Jude cardioverter  defibrillator.  It is a single-chamber device, Dr. Lewayne Bunting.  The  patient does not have a post-procedural hematoma at the pacer site.  The  device has been interrogated and the values are within normal limits.  The chest x-ray shows that the lead is in appropriate position.  There  is no pneumothorax.  On post-procedure day number 1, the patient did  receive a dose of Tylenol #3 fairly close to Vicodin and has become  lethargic, and will stay a little longer than we had planned.  However  in the interim, he was seen by internal medicine recommending Lantus  injections.  They gave a prescription as well as lancets, test strips  and a  lot of information.  They also set him up with [facility] with Dr. Charissa Bash.  Her office will call with an  appointment.   BRIEF HISTORY:  [name] is a 45 year old male.  He has a history of  nonischemic cardiomyopathy, class II-III congestive heart failure,  diabetes and hypertension.  He has a remote history of alcohol abuse,  but stopped heavy drinking 2-3 years ago.  He is referred by Dr.  Jens Som for consideration of ICD.   The patient has no obstructive coronary artery disease.  He does have  intermittent chest pain and anxiety.  He also has a history of NSVT.  The patient qualifies for ICD.  Options, risks and benefits have been  described to the patient and he wishes to proceed.   HOSPITAL COURSE:  The patient presents electively May 08, 2007.  He  underwent implantation of the cardioverter defibrillator without  complication.  He was found to be fairly hypertensive post-procedure  day  number 1.  However, his medications for whatever reason had not been  optimized on the hospital admitting orders.  This has been adjusted in  the meantime.  The patient is achieving 97% oxygen saturation day 1.  As  mentioned above, the device interrogates well.  He has been seen by  internal medicine.  They have counseled him on advancing his diabetes  care from oral medications alone to oral medications and the beginnings  of Lantus 10 units daily.  In addition, incision care has been discussed  with the patient as well as mobility of the arm.  He is asked to keep  his incision dry for the next 7 days and to sponge bathe until Monday,  May 15, 2007.   DISCHARGE MEDICATIONS:  His medications are adjusted as well as possible  at discharge:  1. Norvasc 5 mg daily.  2. Benazepril 20 mg twice daily.  3. Coreg 12.5 mg 2 tabs in the morning and 2 tabs in the evening.  4. Spironolactone 25 mg twice daily.  5. Digoxin 0.25 mg daily.  6. Pravachol 80 mg daily at  bedtime.  His 40 mg dose was increased the      last time he saw Dr. Jens Som in March.  7. Lasix 40 mg tablets 2 in the morning and 3 in the evening.  8. Glipizide 10 mg daily.  9. Lantus 10 units subcutaneous injection daily.  10.Darvocet-N 100 one-two tabs every 4-6 hours as needed for pain.  I      am going to give the patient prescriptions for all of these      medications.   FOLLOW UP:  1. He has follow up with [facility]  They will call      with that appointment.  2. He has follow up with [facility] [address]      number 1 CHF clinic Thursday, May 11, 2007 at 9:20.  3. ICD Clinic Wednesday, May 24, 2007 at 9:20.  4. See Dr. Donnie Aho Tuesday, August 08, 2007 at 3 p.m.   Also of note, Dr. Jens Som had requested follow up liver function  studies as well as lipid panel.  This will be drawn here since we are  actually at a 6 week followup status as Dr.  Jens Som requested to be done in his March 27, 2007 note.  The studies  hopefully will be available through the CHF Clinic people Thursday,  May 11, 2007 on E-chart.   LABORATORY DATA:  Laboratory studies pertinent to this admission drawn  April 26, 2007:  White cells 7.2, hemoglobin 15.8, hematocrit 46.2,  platelets 281, pro-time 12.6, INR 1.1, sodium 139, potassium 3.8,  chloride 106, carbonate 29, glucose 223, BUN 15, creatinine 1.1.      Maple Mirza, PA      Doylene Canning. Ladona Ridgel, MD  Electronically Signed    GM/MEDQ  D:  05/09/2007  T:  05/09/2007  Job:  [id number]   cc:   Carolanne Grumbling, M.D.  [facility]  Raynelle Fanning A. Mayford Knife, M.D.

## 2010-06-02 NOTE — Procedures (Signed)
NAMEHUCK, [name]                ACCOUNT NO.:  [id number]   MEDICAL RECORD NO.:  [id number]         PATIENT TYPE:  OUT   LOCATION:  SLEEP CENTER                 FACILITY:  [facility]   PHYSICIAN:  Clinton D. Maple Hudson, MD, FCCP, FACPDATE OF BIRTH:  08/31/67   DATE OF STUDY:  12/09/2006                            NOCTURNAL POLYSOMNOGRAM   REFERRING PHYSICIAN:  Lucita Ferrara, MD   REFERRING PHYSICIAN:  Lucita Ferrara, M.D.   INDICATIONS FOR STUDY:  Hypersomnia with sleep apnea.   EPWORTH SLEEPINESS SCORE:  10/16   BMI 34.9, weight 257 pounds, height 72 inches, neck 18 inches.   MEDICATIONS:  1. Aspirin 81 mg.  2. Benazepril 5 mg.  3. Glipizide 10 mg.  4. Lasix 20 mg.  5. Toprol-XL 25 mg.   SLEEP ARCHITECTURE:  Total sleep time 331 minutes with sleep efficiency  83%.  Stage 1 was 25%, stage 2 was 68%, stage was absent, REM 6.5% of  total sleep time.  Sleep latency 42 minutes, REM latency 269 minutes.  Awake after sleep onset 24 minutes.  Arousal index 6.  No bedtime  medication was taken.   RESPIRATORY DATA:  Apnea/hypopnea index (AHI) 12.5 obstructive events  per hour indicating mild obstructive sleep apnea/hypopnea syndrome.  There were 16 obstructive apneas, 18 central apneas, 2 mixed apneas, and  33 hypopneas.  Events were not positional.  REM AHI 2.8.  There were  insufficient early events to permit CPAP titration by split protocol on  this study night.   OXYGEN DATA:  Moderate snoring with oxygen desaturation to a nadir of  82%.  Mean oxygen saturation through the study 96% on room air.   CARDIAC DATA:  Sinus rhythm with multifocal PVCs.   MOVEMENT-PARASOMNIA:  Bathroom x2.  No significant movement disturbance.   IMPRESSIONS-RECOMMENDATIONS:  1. Mild obstructive sleep apnea/hypopnea syndrome, apnea/hypopnea      index 12.5 per hour with nonpositional events, moderate snoring,      oxygen desaturation to a nadir of 82%.  There were additional      events not meeting scoring  criteria for apnea/hypopnea index      consideration with a respiratory disturbance index of 14.1,      respiratory effort-related arousal index of 1.6.  2. Scores in this range are occasionally appropriate to be addressed      by continuous positive airway pressure if there are symptoms or      medical impact, including significant daytime sleepiness, insomnia,      hypertension, etc.  If more conservative measures, including weight      loss, encouragement to sleep __________  back, and treatment for      any significant nasal congestion are insufficient, then consider      return for continuous positive airway pressure titration if      clinically appropriate.  Otherwise consider alternative therapies.      Clinton D. Maple Hudson, MD, [facility], FACP  Diplomate, Biomedical engineer of Sleep Medicine  Electronically Signed     CDY/MEDQ  D:  12/18/2006 08:24:19  T:  12/18/2006 15:55:26  Job:  [id number]

## 2010-06-02 NOTE — Assessment & Plan Note (Signed)
[facility]                         ELECTROPHYSIOLOGY OFFICE NOTE   [name], [name]                       MRN:          [id number]  DATE:04/26/2007                            DOB:          Nov 14, 1965    [name] returns today for followup.  He is a very pleasant young man  with a history of nonischemic cardiomyopathy, congestive heart failure  class II to III, diabetes and hypertension.  He has a remote history of  alcohol abuse but he stopped drinking heavily 2-3 years ago.  He is  referred today by Dr. Jens Som for consideration for prophylactic ICD  implantation.  The patient has no obstructive coronary disease.  He does  continue to have chest pain and has some anxiety.  His additional past  medical history is notable for diabetes, hypertension.  He has a history  of anxiety and depression.  He has a history of nonsustained ventricular  tachycardia.   REVIEW OF SYSTEMS:  Review of systems is as noted in the HPI.  He denies  any history of syncope.  He has very minimal peripheral edema.  He does  note some increased thirst and admits to some sodium indiscretion.  Otherwise all systems are negative except as noted above.   FAMILY HISTORY:  Is negative for premature coronary disease or for  congestive heart failure at a young age.   SOCIAL HISTORY:  The patient is married.  He has 2 small children.  He  denies tobacco or alcohol abuse, though he does have a history of both.   PHYSICAL EXAMINATION:  GENERAL:  On exam he is a pleasant, well-  appearing obese man in no acute distress.  VITAL SIGNS:  Blood pressure was 155/97, the pulse 95 and regular, the  respirations were 18.  Weight was 264 pounds.  HEENT:  Normocephalic, atraumatic.  Pupils equal and round.  Oropharynx  moist.  Sclerae anicteric.  NECK:  Revealed no jugular venous distention and no thyromegaly.  Trachea was midline.  The carotids are 2+ and symmetric.  LUNGS:  Clear  bilaterally to auscultation.  No wheezes, rales or rhonchi  are present.  There is no increased work of breathing.  CARDIOVASCULAR:  Regular rate and rhythm.  Normal S1-S2.  There is soft  S4 gallop.  The PMI was enlarged and laterally displaced.  ABDOMEN:  Soft, nontender, nondistended.  There was no organomegaly.  Bowel sounds present.  No rebound or guarding.  EXTREMITIES:  Demonstrate no clubbing, cyanosis or edema.  NEUROLOGIC:  Alert and oriented x3.  Cranial nerves were intact.  His  strength was 5/5 and symmetric.   EKG demonstrates sinus rhythm with a narrow QRS.   IMPRESSION:  1. Nonischemic cardiomyopathy.  2. Congestive heart failure with severe LV (left ventricle)      dysfunction, EF 25-30%.  3. Hypertension.  4. Diabetes.  5. Remote history of alcohol abuse and now no alcohol abuse.   DISCUSSION:  I have discussed the options with the patient in detail.  The risks, benefits, goals and expectations of prophylactic ICD  insertion were discussed with  the patient and he would like to proceed  with this.  This will be scheduled as soon as possible convenient time.  With regard to his heart failure the patient and I spent a considerable  amount of time talking about maintenance of a low sodium diet and up  titration of his Lasix dose and I have asked him to go up to taking 2  tablets of Lasix (80 mg twice daily).     Doylene Canning. Ladona Ridgel, MD  Electronically Signed    GWT/MedQ  DD: 04/26/2007  DT: 04/26/2007  Job #: [id number]   cc:   Melvern Banker

## 2010-06-02 NOTE — Discharge Summary (Signed)
[name], [name]                ACCOUNT NO.:  [id number]   MEDICAL RECORD NO.:  [id number]          PATIENT TYPE:  INP   LOCATION:  1430                         FACILITY[facility]  [facility]   PHYSICIAN:  Herbie Saxon, MDDATE OF BIRTH:  [birth year]   DATE OF ADMISSION:  09/30/2007  DATE OF DISCHARGE:  10/02/2007                               DISCHARGE SUMMARY   DISCHARGE DIAGNOSES:  1. Upper gastrointestinal bleed, stable.  2. Peptic ulcer disease, i.e. gastric ulcer.  3. Nonsteroidal anti-inflammatory drug abuse.  4. Diabetes mellitus, improved control.  5. Morbid obesity.  6. History of congestive heart failure status post implantable      cardioverter defibrillator.  7. Hypertension, mild.  8. Enterocolitis, improved.  9. History of obstructive sleep apnea.  10.Mild hyponatremia, resolved.   CONSULTATION:  Shirley Friar, MD, gastroenterology.   PROCEDURE:  Endoscopy.  EGD was performed on October 01, 2007.  EGD  showed problems superficial ulcer in the stomach.   RADIOLOGY:  The abdominal x-ray of September 30, 2007, shows positive  bowel gas.   HOSPITAL COURSE:  This 45 year old African American male presented to  the emergency room with coffee-ground vomiting, nausea and vomiting and  diarrhea of 1-day duration.  Patient does admit to NSAID abuse, taking 4  tablets of Advil daily for body pains which is relieved by Maalox.  The  patient was put on n.p.o., started on IV fluid hydration with normal  saline, watching for fluid overload because he does have a low ejection  fraction of less than 20%.  He subsequently underwent  esophagogastroduodenoscopy on October 01, 2007, which confirmed a  gastric ulcer.  Symptoms have improved with the use of IV Protonix and  his hematocrit has been stable.  His glucose control has been optimized  by increasing his Lantus insulin dose and NovoLog coverage.  Hemoglobin  A1c was noticed to be 9.4, suggesting previous medical  noncompliance.  He has been cleared for discharge by gastroenterologist.  Lipid panel  has not been sent.  Patient was continued on an alternative Zocor  instead of his Lipitor which was taken at home.  His Zoloft has been  discontinued and his Paxil dose has been increased as SSRIs to avoid  polypharmacy.   CONDITION ON DISCHARGE:  Stable.   DIET:  Low sodium, heart healthy, 1800 calorie ADA, low cholesterol.   ACTIVITY:  No restrictions.   FOLLOW UP:  1. Primary care physician at [facility] in the next 1-2 weeks.  2. Shirley Friar, MD, gastroenterology, in 8 weeks.   DISCHARGE MEDICATIONS:  1. Paxil 20 mg daily.  2. Protonix 40 mg twice daily.  3. Norvasc 5 mg daily.  4. Continue previous home medication as per the H&P dictated on      September 30, 2007, by Dr. Christella Noa.   PHYSICAL EXAMINATION AT DISCHARGE:  VITAL SIGNS:  Temperature 98, pulse  74, respiratory rate 16, blood pressure 147/95.  GENERAL APPEARANCE:  Middle-aged man not in acute distress.  HEENT:  Head is atraumatic, normocephalic.  Oropharynx and nasopharynx  are clear.  Moist mucous membranes.  NECK:  Supple.  No adenopathy, no jugular venous distension.  CHEST:  Clinically clear.  No rales or rhonchi.  CARDIOVASCULAR:  Heart sounds 1 and 2, regular.  ABDOMEN:  Benign.  EXTREMITIES:  No clubbing, cyanosis, or edema.  NEUROLOGIC:  He is alert and oriented to time, place and person.  No  neurological deficits.  SKIN:  No erythema, no joint swelling.   LABORATORY DATA:  Hemoglobin 14.9, hematocrit 43.  Urine culture was  negative.  The chemistry shows a sodium of 136, potassium 3.7, chloride  102, bicarbonate 25, glucose 194, BUN 6, creatinine 1.   DURATION OF DISCHARGE ENCOUNTER:  Greater than 30 minutes.      Herbie Saxon, MD  Electronically Signed     MIO/MEDQ  D:  10/02/2007  T:  10/03/2007  Job:  [id number]   cc:   Shirley Friar, MD  Fax: [phone]   Melvern Banker  Fax: [phone]
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
## 2010-06-02 NOTE — Assessment & Plan Note (Signed)
[facility]                          CHRONIC HEART FAILURE NOTE   [name], [name]                       MRN:          [id number]  DATE:05/11/2007                            DOB:          02-May-1965    PRIMARY CARDIOLOGIST:  Madolyn Frieze. Jens Som, M.D., [facility]   PRIMARY CARE PHYSICIAN:  The patient is followed by [facility] on  [address].   [name] returns today for followup of his congestive heart failure  which is secondary to nonischemic cardiomyopathy with EF currently 15-  20% status post recent  hospitalization for implantation of a St. Jude  current defibrillator.  The patient was discharged home on May 09, 2007.  Since being discharged home, he states he has been doing well.  He is still complaining of incisional soreness and some seasonal allergy  irritation to his nasal cavity and throat; otherwise, he states he is  doing well. He continues to remain compliant with medications.  At time  of discharge, his Lasix was decreased to 80 mg in the morning and 40 mg  in the evening.  [name] was also evaluated by [name] during hospitalization secondary to hyperglycemia, and he was  recommended to start on Lantus 10 units daily.  A prescription was given  at time of discharge.  [name] has not gotten this filled yet but  states he will be going there today to [facility] to get the  prescription filled.   PAST MEDICAL HISTORY:  1. Congestive heart failure secondary to nonischemic cardiomyopathy      with EF 15-20%.  2. Status post implantation of a St. Jude ICD.  3. Diabetes with recent initiation of Lantus insulin.  4. Hypertension.  5. Anxiety/depression.  6. History of nonsustained ventricular tachycardia.  7. Obesity.  8. Dyslipidemia.   REVIEW OF SYSTEMS:  As stated above, otherwise negative.   CURRENT MEDICATIONS:  1. Pravastatin 80 mg daily.  2. Lantus insulin 10 units subcu q.h.s.  3.  Spironolactone 25 b.i.d.  4. Norvasc 5 mg daily.  5. Sertraline 100 mg daily.  6. Benazepril 20 mg b.i.d.  7. Carvedilol 25 mg b.i.d.  8. Furosemide 80 mg in the a.m. and 40 at 3:00 p.m.  9. Digoxin 0.25 mg daily.  10.Glipizide 10 mg daily.   PHYSICAL EXAMINATION:  VITAL SIGNS:  Weight 256 pounds.  Blood pressure  142/88, with a heart rate of 74.  GENERAL:  [name] is in no acute distress.  HEENT:  Normal.  NECK:  Without signs of JVD.  LUNGS:  Clear to auscultation bilaterally.  CARDIOVASCULAR:  S1-S2, regular rate and rhythm.  CHEST:  ICD site to left upper chest with Steri-Strips dry and intact  without hematoma, redness, or drainage.  ABDOMEN:  Soft, nontender, positive bowel sounds.  EXTREMITIES:  Lower extremities without clubbing, cyanosis or edema.  NEUROLOGICAL:  Alert and oriented x3.   CLINICAL DATA:  12-lead EKG showing sinus rhythm at a rate of 69 without  acute ST or T wave changes.   IMPRESSION:  Congestive heart failure secondary to  nonischemic  cardiomyopathy without signs of volume overload at this time, status  post recent implantation of implantable cardioverter-defibrillator.  The  patient to continue current medications.  Check lab work today.  The  patient to follow up in the pacer clinic on May 24, 2007 at 9:20 a.m.      Dorian Pod, ACNP  Electronically Signed      Rollene Rotunda, MD, [facility]  Electronically Signed   MB/MedQ  DD: 05/11/2007  DT: 05/11/2007  Job #: [id number]   cc:   [facility]  [facility]

## 2010-06-02 NOTE — Assessment & Plan Note (Signed)
[facility]                            CARDIOLOGY OFFICE NOTE   [name], [name]                       MRN:          [id number]  DATE:08/16/2007                            DOB:          06/23/1965    [name] is a gentleman who has a history of nonischemic  cardiomyopathy.  He had a St. Jude defibrillator placed in April 2009.  Since I last saw him, he is complaining of pain around the defibrillator  site.  He also states that 3 weeks ago, he had a small amount of pus  drained from the lateral aspect of the incision.  He has not had fevers  or chills.  He occasionally has mild dyspnea on exertion.  There is no  orthopnea, PND, or pedal edema.  He otherwise has not had any other  chest pains besides the pain around his incision site.  He said there  was some swelling as well.  Note, he is somewhat anxious in the office  today.   PRESENT MEDICATIONS:  1. Glipizide 10 mg p.o. daily.  2. Digoxin 0.25 mg p.o. daily.  3. Lasix 80 mg p.o. b.i.d.  4. Carvedilol 37.5 mg in the morning and 25 mg in the evening.  5. Benazepril HCT 20/12.5 mg tablets one p.o. b.i.d.  6. Spironolactone 25 mg p.o. b.i.d.  7. Pravachol 80 mg p.o. daily.  8. Insulin.  9. Lipitor 80 mg p.o. daily.  10.Citalopram 40 mg p.o. daily.   PHYSICAL EXAMINATION:  VITAL SIGNS:  Today, shows a blood pressure that  is elevated at 150/98 and his pulse is 83.  Weigh is 162 pounds.  His  temperature was 97.1.  HEENT:  Normal.  NECK:  Supple.  CHEST:  Clear.  CARDIOVASCULAR:  Regular rate and rhythm.  His ICD site shows no  evidence of fluctuance, and there is no erythema or warmth.  I cannot  appreciate any discharge from his previous incision and it appears to be  healed well.  ABDOMEN:  No tenderness.  EXTREMITIES:  No edema.   His electrocardiogram shows sinus rhythm at a rate of 81.  There are no  significant ST changes.   DIAGNOSES:  1. Nonischemic cardiomyopathy - Mr.  [name] is on excellent      medications.  We will continue with his digoxin, spironolactone,      ACE inhibitor, and beta-blocker.  2. Chest pain - his symptoms are atypical and most likely related to      his implantable cardioverter-defibrillator site.  A previous      catheterization has revealed nonobstructive disease.  3. Status post implantable cardioverter-defibrillator - he described      some mild drainage from his side approximately 3 weeks ago.  I have      examined him and I found no evidence of infection.  He is afebrile.      I will check the CBC to check his white blood cell count.  He will      otherwise continue to track this.  I have asked him to follow up  with Dr. Ladona Ridgel for further evaluation.  4. Hypertension - his blood pressure is elevated today.  He did not      start his Norvasc previously, and we have asked him to resume at 5      mg p.o. daily.  5. Diabetes mellitus.  6. History of ethyl alcohol abuse.  7. History of nonsustained ventricular tachycardia.  8. Hyperlipidemia - he is taking both Pravachol and Lipitor.  I have      asked him to discontinue his Pravachol.  We will check lipids and      liver and adjust as indicated.  Note, I will also check a BMET      given his diuretic use.  We will see him back in 6 months.     Madolyn Frieze Jens Som, MD, [facility]  Electronically Signed    BSC/MedQ  DD: 08/16/2007  DT: 08/17/2007  Job #: [id number]   cc:   Dala Dock

## 2010-06-02 NOTE — H&P (Signed)
[name], [name]                ACCOUNT NO.:  [id number]   MEDICAL RECORD NO.:  [id number]          PATIENT TYPE:  INP   LOCATION:  3738                         FACILITY:  [facility]   PHYSICIAN:  Lonia Blood, M.D.      DATE OF BIRTH:  June 29, 1965   DATE OF ADMISSION:  12/02/2006  DATE OF DISCHARGE:                              HISTORY & PHYSICAL   PRIMARY CARE PHYSICIAN:  The patient is unassigned.  He goes to [facility].   PRESENTING COMPLAINT:  Shortness of breath.   HISTORY OF PRESENT ILLNESS:  The patient is a 45 year old patient with  known diabetes and hypertension who was diagnosed with acute CHF in  October of this year.  At that time his EF was found to be 15% to 20%.  He was started on beta blocker, ACE inhibitors, Lasix, and he was sent  back to [facility] to continue his care.  Per the patient, since  October 18 when he left the hospital, he has been unable to follow up at  [facility].  He has not been able to get his own doctor assigned.  He also did not get any Cardiology followup.  He came in today with  increasing shortness of breath.  He also has PND, orthopnea and has  excessive exertional dyspnea.  Per patient, even going to the bathroom  makes him extremely short of breath.  He also has some chest pain that  comes with his shortness of breath but not sharp, no radiation.  No  diaphoresis.  This is coming quite frequently.  He denied any fever or  cough.  He also has associated leg swelling, especially in the mornings.  The patient has been sleeping virtually propped up.  His sleeping has  always been for only two hours before he is awakened with shortness of  breath.   PAST MEDICAL HISTORY:  His past medical history is significant for  diabetes, congestive heart failure with EF of 20%, hypertension, morbid  obesity, history of pneumonia during last hospitalization, history of  headaches. Some small right pleural effusion and small pericardial  effusion.   History of multiple enlarged mediastinal lymph nodes, thought  to be reactive in nature.   ALLERGIES:  No known drug allergies, although he is allergic to  [facility].   MEDICATIONS:  1. Aspirin 81 mg daily.  2. Benazepril 5 mg daily.  3. Glipizide 10 mg daily.  4. Lasix 20 mg daily.  5. Toprol XL 25 mg daily.   SOCIAL HISTORY:  The patient is married and lives here in [place].  Denied any tobacco use.  No alcohol use.  No IV drug use.Marland Kitchen  He has a  prior history of cigar smoking, but he quit a long time ago.  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   FAMILY HISTORY:  Mother died at 47 from complications of heart of MI.  Mother was also diabetic and hypertensive.  He does not know his father  because he not met him.  The patient has four siblings, three of whom  have diabetes and  hypertension.  One sister is healthy.  He has four  children, all healthy.   REVIEW OF SYSTEMS:  A 12-point review of systems is essentially per the  HPI.   PHYSICAL EXAMINATION:  VITAL SIGNS:  On his exam temperature 96.9, blood  pressure 129/92, pulse 103, respiratory rate 22, saturation 98% on room  air.  GENERAL: The patient is morbidly obese, pleasant, in mild respiratory  distress.  HEENT: PERL.  Extraocular movements are intact.  NECK:  Neck is supple with some JVD out to the angle of the jaw but no  lymphadenopathy. RESPIRATORY:  He has basilar crackles with fair air  entry bilaterally.  No wheezes, no rales.  CARDIOVASCULAR:  The patient  is tachycardic with some gallop rhythm with audible S3.  ABDOMEN:  Abdomen is obese, soft and nontender with positive bowel sounds.  EXTREMITIES:  Extremities show trace edema but no cyanosis, no clubbing.   LABORATORY DATA:  His labs showed negative cardiac enzymes.  Drug screen  is positive for cannabis.  Urinalysis is negative.  His BNP is 739.  White count is 6600, hemoglobin 15.3 with a platelet count of 255,000.  Sodium is 140, potassium 4, chloride 108, BUN  15. Creatinine is  currently pending.     His EKG showed sinus tachycardia with a rate of 108, regular  intervals, with right axis deviation and nonspecific T-wave  abnormalities, essentially unchanged from previous EKG of November 11, 2006.  Chest x-ray showed mild congestive heart failure with some mild  pulmonary edema.   ASSESSMENT:  This is a 45 year old gentleman with a known history of  congestive heart failure, who is here with what appears to be a  congestive heart failure exacerbation.   PLAN:  The plan, therefore, would be:  1. For CHF exacerbation will admit the patient to a telemetry bed.  We      will check serial cardiac enzymes to rule out acute coronary      syndrome as a possible trigger, but more than likely the patient      needs improved diuresis.  He has just had a 2-D echo so we may not      repeat it, but the patient needs to be hooked up with a      cardiologist so he could have outpatient followup.  I will add      aldactone, in addition to his Toprol ACE inhibitor.  I will also      consider digoxin in this patient being a male.  The patient may      also benefit from implantable defibrillator plus or minus      anticoagulation depending on Cardiology recommendations.  The goal      is to keep his INRs negative hence forth.  2. For diabetes I will keep the patient on his Glucotrol, check      hemoglobin A1c once more.  I will also keep him on sliding scale      insulin.  3. Hypertension.  Blood pressure seems to okay on current medication      and I will continue.  4. Morbid obesity.  The patient cannot exercise due to exertional      dyspnea but he knows that that has a lot to do with his problems at      this point.  We will continue with his management as needed and any      further treatment depends on his response in the hospital.  Lonia Blood, M.D.  Electronically Signed     LG/MEDQ  D:  12/02/2006  T:  12/03/2006  Job:  [id number]

## 2010-06-02 NOTE — Assessment & Plan Note (Signed)
[facility]                         ELECTROPHYSIOLOGY OFFICE NOTE   IMRE, VECCHIONE                         MRN:          [id number]  DATE:12/07/2006                            DOB:          [birth year]    CARDIOLOGIST:  Dr. Olga Millers   ELECTROPHYSIOLOGIST:  Dr. Sherryl Manges   PRIMARY CARE:  [facility]   He has no known drug allergies.   Briefly, this is a 45 year old male admitted back in October 2008 after  fighting symptoms of dyspnea, dyspnea on exertion, orthopnea, PND, chest  tightness, fatigue for 1 month.  His admission diagnosis here at [facility] was pneumonia/CHF.  An echocardiogram was obtained in October at  that time and the ejection fraction was 15-20% with diffuse left  ventricular hypokinesis.  He was discharged to follow up with  [facility] on Lasix 20 mg, Toprol-XL 25 mg, benazepril 5 mg daily.   He has not done well since discharge.  He has a high stress/high  activity job at [employer].  Currently, he is unable to walk up one  flight of stairs without stopping - he gets too short of breath.  He was  approximately discharged about 2 weeks before readmission November 14  with recurrence of acute-on-chronic New York Heart Association Class  IIIb congestive heart failure.  He had marked lower extremity edema.  He  had pulmonary vascular congestion on chest x-ray.  His BNP was 739.  He  was seen by Dr. Jens Som in consultation who increased his ACE inhibitor  from 5 mg to 10 mg.  He started him on digoxin.  He changed the Toprol-  XL to Coreg and pushed it as allowed by blood pressure and heart rate.   The patient was on IV Lasix 40 mg q.12h. for a 5-day period with  improvement in breathing status as well as lower extremity edema as well  as abdominal tightness.  He had a left heart catheterization December 05, 2006.  Cardiac index was 2.8, the LAD and the right coronary artery  were free of significant coronary  artery disease.  The left circumflex  had nonobstructive 40-50% stenosis at the ramus intermediate.  This  represents a nonischemic cardiomyopathy and the etiology is uncertain  currently.  Past medical history also includes diabetes, hypertension,  dyslipidemia, a strong family history of coronary artery disease, and  probable obstructive sleep apnea.   He was also noted to have nonsustained ventricular tachycardia and  electrophysiology consult was obtained.  He was seen by Dr. Sherryl Manges  on December 07, 2006.  Dr. Graciela Husbands agrees with Dr. Ludwig Clarks plan of  complete and thorough medication therapy for his nonischemic  cardiomyopathy and to obtain a followup 2-D echocardiogram in about 2-3  months.  If the left ventricular ejection fraction is less than 35% at  that time the patient would qualify for a cardioverter-defibrillator.  I  have dictated that the patient will have followup echocardiogram.  If  ejection fraction less than 35% in 2-3 months he would qualify for  cardioverter-defibrillator.  Dr. Graciela Husbands had mentioned that in  a patient  with nonischemic cardiomyopathy and nonsustained ventricular tachycardia  the overall risk of mortality is elevated but not the arrhythmic risk.  The patient discharges with the instructions to weigh himself daily and  to call Dr. Ludwig Clarks office if he gains more than 4 pounds in two  days, to keep his fluid intake not more than 1200-1500 mL daily  expressed as the amount of soda cans he could drink but not soda, of  course.  He will see Dr. Jens Som at [facility] Tuesday, December  2 at 8:15 a.m.   His medication list hopefully all to be filled at [facility] or [facility]:  1. Benazepril 10 mg daily.  2. Coreg 12.5 mg twice daily.  3. Spironolactone 25 mg daily.  4. Furosemide 40 mg twice daily - one in the morning and one at 2 p.m.  5. Glipizide 10 mg daily.  6. Digoxin 0.25 mg daily.  7. Pravastatin 40 mg daily at bedtime.  8. Prilosec  over-the-counter one tablet daily.  9. Paxil 20 mg daily  10.Fioricet one or two tabs every 4 hours for headache  (it worked in      the hospital)  There was some thought that the patient would not be able to afford  Lantus and a higher powered statin such as Lipitor, Coreg or Zocor, so  pravastatin is substituted and the PROSPER study shows that it has a  benefit.      Maple Mirza, PA  Electronically Signed      Duke Salvia, MD, [facility]  Electronically Signed   GM/MedQ  DD: 12/07/2006  DT: 12/08/2006  Job #: [id number]

## 2010-06-02 NOTE — Assessment & Plan Note (Signed)
[facility]                         ELECTROPHYSIOLOGY OFFICE NOTE   GAREY, ALLEVA                       MRN:          [id number]  DATE:09/15/2007                            DOB:          01-04-1966    Mr. Remmers returns today for followup.  He is a very pleasant 45-year-  old male with a nonischemic cardiomyopathy and congestive heart failure  status post prophylactic ICD insertion back in April.  He returns today  for followup.  He had no specific complaints with his device except for  some mild discomfort at his incision site.  He denies chest pain.  He  denies shortness of breath.  He had no specific complaints otherwise.  He also notes that he has problems with erectile dysfunction.   His medications include glipizide 10 mg a day, digoxin 0.25 a day,  furosemide 40 a day, carvedilol 12.5 twice daily, benazepril 20 mg twice  daily, Norvasc 5 a day, Aldactone 25 twice daily, Pravachol 80 a day,  Lantus insulin, metformin 500 twice daily,  benazepril/hydrochlorothiazide 20/12.5 twice daily, and Lipitor 80 a  day.   On exam, he is a pleasant well-appearing 45 year old man in no distress.  Blood pressure was 150/96, pulse 96 and regular, respirations were 18,  and weight was 264 pounds.  Neck revealed no jugular venous distention.  Lungs clear bilaterally to auscultation.  No wheezes, rales, or rhonchi  are present.  Cardiovascular exam revealed regular rate and rhythm.  Normal S1 and S2.  Extremities demonstrated no edema.   Interrogation of his defibrillator demonstrates a St. Jude current  single chamber device.  R waves of 12.  The impedance 750.  The  threshold was 0.75 volts at 0.5 msec.  There are no intercurrent IC  therapies.   IMPRESSION:  1. Nonischemic cardiomyopathy.  2. Congestive heart failure, class II.  3. Hypertension.  4. Erectile dysfunction.   DISCUSSION:  Mr. Rosendahl is stable.  His defibrillator is working  normally today.  His blood pressure remains elevated.  I have  recommended that we increase his carvedilol from 12.5 twice daily to 25  twice daily.  I have also given him a prescription for Viagra to use as  needed for his erectile dysfunction.  We will see him back in the office  in 8-9 months.     Doylene Canning. Ladona Ridgel, MD  Electronically Signed    GWT/MedQ  DD: 09/15/2007  DT: 09/16/2007  Job #: [id number]   cc:   [facility] Office

## 2010-06-02 NOTE — Assessment & Plan Note (Signed)
[facility]                          CHRONIC HEART FAILURE NOTE   [name], [name]                       MRN:          [id number]  DATE:01/23/2007                            DOB:          1965/08/05    PRIMARY CARE:  [facility].   PRIMARY CARDIOLOGIST:  Dr. Olga Millers.   [name] returns today for followup of his congestive heart failure  which is secondary to nonischemic cardiomyopathy with an EF currently 15-  20%.  I saw [name] is a new patient back in December.  Since that  time he has been seen in the emergency room at [facility] for diagnosis  of right middle lobe pneumonia.  On January 01, 2007 he was discharged  home with prescription for Cipro, which he did not get filled until  January 07, 2007.  He returns today for followup with heart failure and  is very of symptomatic in regards to his pneumonia.  He states he missed  3 days with the pneumonia, just found the bottle again yesterday and  started back today.  He is very tachypneic.  He is tachycardiac.  He has  a productive cough of yellow, thick sputum.  He has ongoing fever and  chills at home and headaches, body aches.  He states he has not been  able to sleep and that he feels bad all over.  He denies orthopnea or  PND.  States he is not sleeping because of the coughing and that aches.  He has been compliant with his other medications.   PAST MEDICAL HISTORY:  1. Acute on chronic congestive heart failure secondary to nonischemic      cardiomyopathy with EF 15-20%.  2. Diabetes.  3. Hypertension.  4. Nonsustained V tach  5. Generalized anxiety.  6. Functionally illiterate.  7. Health illiteracy.  8. Socioeconomic issues.  9. History of EtOH abuse.   REVIEW OF SYSTEMS:  As stated above.   CURRENT MEDICATIONS:  1. Spironolactone 25 mg daily.  2. Paxil 10 mg daily.  3. Pravastatin 40 mg daily.  4. Glipizide 10 mg daily.  5. Digoxin 0.25 mg daily.  6.  Furosemide 80 in the a.m. 40 in the evening.  7. Benazepril 20 mg b.i.d.  8. Cipro 500 mg b.i.d. x2 more days.   REVIEW OF SYSTEMS:  As stated above.   PHYSICAL EXAM:  Weight 258 pounds, blood pressure 150/94, heart rate  116, temperature 99.5, pulse ox 94% on room air.  [name] is in no acute distress.  However, he looks very sickly  today.  He is tachypneic, coughing.  SKIN:  Clammy.  He has no jugular vein distention at 45 degree angle.  Lungs I do not hear much air movement in the right lobes.  Left lobes  are clear.  Cardiovascular exam S1-S2 tachycardia.  ABDOMEN:  Soft, nontender, obese, positive bowel sounds.  LOWER EXTREMITIES:  Without clubbing, cyanosis or edema.  NEUROLOGICAL:  He is alert and oriented x3.   IMPRESSION:  Congestive heart failure without signs of volume overload  at this time.  However,  I am more concerned about the patient's  pulmonary status.  Recent diagnosis of pneumonia apparently not  responding to antibiotics.  I am going to send the patient to the  emergency room at [facility] for reevaluation.  I suspect he will need  IV antibiotic therapy at this point.  Will also check a 12-lead EKG  today for confirmation. 12-lead EKG showed sinus tach at a rate of 114.  Will do a heart failure courtesy visit if [name] is in the hospital  in the a.m.      [name], ACNP  Electronically Signed      Jonathan Rotunda, MD, [facility]  Electronically Signed   MB/MedQ  DD: 01/23/2007  DT: 01/23/2007  Job #: [id number]

## 2010-06-02 NOTE — Assessment & Plan Note (Signed)
[facility]                            CARDIOLOGY OFFICE NOTE   [name], [name]                       MRN:          [id number]  DATE:03/27/2007                            DOB:          06-Aug-1965    [name] is a gentleman who has a history of nonischemic  cardiomyopathy.  Please refer to my previous note for details.  Since I  last saw him, he does have some dyspnea on exertion.  He also  occasionally has mild orthopnea, but there is no PND.  There is no pedal  edema.  He also complains of chest pain which is described as pins in  his chest.  There is no syncope.  He has also complained of shoulder and  back pain.   His medications at present include:  1. Pravachol of 40 mg p.o. every night.  2. Glipizide 10 mg p.o. daily  3. Digoxin 0.25 mg daily.  4. Lasix 80 mg in the morning and 120 in the evening.  5. Carvedilol 25 mg p.o. b.i.d.  6. Benazepril 20 mg p.o. b.i.d.  7. Spironolactone 25 mg p.o. b.i.d.  8. Sertraline.   PHYSICAL EXAMINATION:  Shows a blood pressure of 146/96 and his pulse is  78.  His weighs 257 pounds.  HEENT:  Normal.  NECK:  Supple.  CHEST:  Clear.  CARDIOVASCULAR:  Regular rate and rhythm.  ABDOMINAL EXAM:  Shows no tenderness.  EXTREMITIES:  Show no edema.   His electrocardiogram shows a sinus rhythm at a rate of 78.  There is a  first-degree AV block.  There are no significant ST changes.   DIAGNOSES:  1. Nonischemic cardiomyopathy - [name] is on excellent      medications.  I will plan to proceed with an echocardiogram to      reassess his LV function.  If his ejection fraction is 30% or less,      then he will need an EP evaluation for ICD/CRT.  Note, that he did      have a BMET checked on February 28, 2007 that showed a potassium of      4 and his BUN and creatinine of 12 and 0.95, respectively.  2. Chest pain - his symptoms are atypical, and a previous      catheterization has revealed  nonobstructive coronary disease.  We      will not pursue this further.  3. Hypertension - blood pressure is elevated today.  I have asked him      to add Norvasc 5 mg p.o. daily, and this can be increased as      needed.  4. Diabetes mellitus - per his primary care physician.  5. History of EtOH abuse.  6. History of nonsustained tachycardia - he will continue on his beta      blocker, and we are reassessing his LV function.  7. Hyperlipidemia - I have asked him to increase his Pravachol to 80      mg daily.  We will check lipids and liver in 6 weeks and adjust as  indicated.   He will see [place] back in 3 months.  He will see the CHF clinic back in 1  month.     [name] Jens Som, MD, [facility]  Electronically Signed    BSC/MedQ  DD: 03/27/2007  DT: 03/28/2007  Job #: [id number]   cc:   [name]

## 2010-06-02 NOTE — Assessment & Plan Note (Signed)
[facility]                            CARDIOLOGY OFFICE NOTE   RAJOHN, HENERY                       MRN:          [id number]  DATE:12/30/2006                            DOB:          June 27, 1965    Mr. Daubert is a gentleman that I recently met in [facility],  secondary to congestive heart failure symptoms.  He was noted at that  time that an echocardiogram performed on October 16 had revealed severe  LV dysfunction with an ejection fraction of 15-20%.  There was mild  mitral regurgitation.  There was also evidence of RV systolic  dysfunction.  The patient ultimately underwent cardiac catheterization  in November.  He was found to have pulmonary capillary wedge pressure of  27.  An LV gram was not performed.  There was 40% stenosis in the  circumflex but no disease was noted in the LAD.  There was no disease in  the right.  He has therefore been treated medically.  Note a TSH and a  ferritin were normal.  He was last seen in the Heart Failure Clinic on  December 19, 2006.  At that time his Coreg was increased to 25 mg p.o.  b.i.d.  His Lasix was increased to 80 mg in the morning and 40 mg at 3  p.m.  Note he also by mistake has been taking 40 mg at bedtime, which  has caused significant problems with sleeping due to urinating.  He  continues to have dyspnea on exertion but there is no orthopnea, PND,  pedal edema, palpitations, presyncope, or syncope.  He also complains of  pain in his chest at times that is not exertional.   MEDICATIONS:  1. Spironolactone 25 mg p.o. daily.  2. Paroxetine 10 mg p.o. daily.  3. Benazepril 20 mg p.o. q.a.m.  4. Pravachol 40 mg p.o. at bedtime.  5. Glipizide 10 mg p.o. daily.  6. Digoxin 0.25 mg p.o. daily.  7. Lasix 40 mg tablets, two in the morning, one at 3 p.m., and one in      the evening.  8. Carvedilol 25 mg p.o. b.i.d.   PHYSICAL EXAMINATION:  Blood pressure is 131/88 and his pulse is 78.  He  weighs 156 pounds.  HEENT:  Normal.  NECK:  Supple.  CHEST:  Clear.  CARDIOVASCULAR:  Regular rate.  ABDOMEN:  Shows no tenderness.  EXTREMITIES:  Show no edema.   His electrocardiogram shows a sinus rhythm at a rate of 100.  The axis  is normal.  There are no significant ST changes.   DIAGNOSES:  1. Cardiomyopathy.  This is presumed secondary to possible alcohol use      in the past.  Note he did not have significant coronary disease.      The TSH and ferritin were normal.  We will continue with medical      therapy including his Carvedilol 25 mg p.o. b.i.d., digoxin,      Spironolactone, and Lasix.  I have asked him to decrease his Lasix      to 80 mg in  the morning and 40 at 3 p.m.  I will increase his      Benazepril to 20 mg p.o. b.i.d.  We will check a BMET in one week      to follow his potassium and renal function.  2. Congestive heart failure.  As per #1.  Note:  We will plan to      repeat his echocardiogram in three months after his medications      have been fully titrated.  If his LV function remains severely      reduced, then he will need an EP evaluation for ICD/CRT.  3. Hypertension.  We will increase his Benazepril as described above.  4. Diabetes mellitus.  Per his primary care physician.  5. History of ETOH use.  We discussed the importance of avoiding this.  6. History of nonsustained ventricular tachycardia.  He will continue      on his beta blocker and we will proceed with a plan as outlined      above.  7. History of anxiety.   I will see him back in three months and in the meantime he will  continued to be followed in the [facility].     Madolyn Frieze Jens Som, MD, [facility]  Electronically Signed    BSC/MedQ  DD: 12/30/2006  DT: 01/01/2007  Job #: [id number]

## 2010-06-02 NOTE — Letter (Signed)
April 20, 2007     RE:  [name], [name]  MRN:  [id number]  /  DOB:  Aug 10, 1965   To Whom It May Concern:   [name] is a patient of mine in [place], [place] at [facility].  He was recently diagnosed with a severe nonischemic  cardiomyopathy.  His most recent echocardiogram on April 04, 2007  revealed severe LV dysfunction with an ejection fraction of 25-35%.  He  has significant dyspnea on exertion.  Given his physical limitations I  think it would be very difficult for him to be gainfully employed.  I  think he most likely should be considered disabled.  Please feel free to  contact me if there are concerns or questions about this issue.    Sincerely,      [name]. Jens Som, MD, [employer] [facility]  Electronically Signed    BSC/MedQ  DD: 04/20/2007  DT: 04/21/2007  Job #: [id number]
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
## 2010-06-02 NOTE — Op Note (Signed)
[name], DEEB NO.:  [id number]   MEDICAL RECORD NO.:  [id number]          PATIENT TYPE:  OBV   LOCATION:  2019                         FACILITY:  [facility]   PHYSICIAN:  Doylene Canning. Ladona Ridgel, MD    DATE OF BIRTH:  1966-01-19   DATE OF PROCEDURE:  05/08/2007  DATE OF DISCHARGE:                               OPERATIVE REPORT   PROCEDURE PERFORMED:  Implantable cardioverter-defibrillator  implantation.   INDICATIONS:  Congestive heart failure with a nonischemic cardiomyopathy  and EF of 20%.   INTRODUCTION:  The patient is a 45 year old male with longstanding  hypertension and hypertensive heart disease with class II heart failure.  He has severe LV dysfunction with an EF of 20% to 25%.  He is now  referred for prophylactic ICD implantation.   PROCEDURE:  After informed consent was obtained, the patient was taken  to the Diagnostic EP Lab in a fasting state.  After usual preparation  and draping, intravenous fentanyl and midazolam was given for sedation.  Lidocaine 30 mL was infiltrated into the left infraclavicular region.  A  7-cm incision was carried out over this region and electrocautery was  utilized to dissect down to the fascial plane.  The left subclavian vein  was subsequently punctured and the St. Jude Durata model 7121, 65-cm  active fixation defibrillation leads serial number [id number] was advanced  into the right ventricle by way of the subclavian vein under  fluoroscopic guidance.  Mapping was carried out at the final site on the  RV septum.  The R-waves measured 28 mV and the pacing impedance was 738  ohms.  The threshold was 0.4 volts of 0.5 milliseconds.  A 10 volt pace  did not stimulate the diaphragm and there was a large entry current  noted.  With the defibrillator lead in satisfactory position and  actively fixed, the lead was secured to the subpectoralis fascia with a  figure-of-eight silk suture.  The sewing sleeve was also secured with  silk suture.  Electrocautery was then utilized to make a subcutaneous  pocket.  Kanamycin irrigation was utilized to irrigate the pocket and  electrocautery was utilized to assure hemostasis.  The St. Jude current  VRRF single-chamber defibrillator serial number [id number] was attached to  the defibrillation lead and placed back in the subcutaneous pocket.  The  pocket was irrigated with kanamycin.  The patient was more deeply  sedated and defibrillation threshold testing was carried out.   After the patient was more deeply sedated with fentanyl and Versed, VF  was induced with a T-wave shock and a 15 J shock was delivered, which  terminated VF and restored sinus rhythm.  At this point with  satisfactory defibrillation threshold, the incision was closed with a  layer of 2-0 Vicryl, followed by layer of 3-0 Vicryl.  Benzoin was  painted on the skin, Steri-Strips were applied, pressure dressing was  placed, and the patient was returned to his room in  satisfactory condition.  There were no immediate procedure complications  and full results demonstrated successful implantation of a St. Jude  single-chamber  defibrillator in a patient with nonischemic  cardiomyopathy and congestive heart failure.      Doylene Canning. Ladona Ridgel, MD  Electronically Signed     GWT/MEDQ  D:  05/08/2007  T:  05/08/2007  Job:  [id number]   cc:   Madolyn Frieze. Jens Som, MD, [facility]  [facility]  Rollene Rotunda, MD, [facility]

## 2010-06-02 NOTE — Discharge Summary (Signed)
[name], [name]                ACCOUNT NO.:  [id number]   MEDICAL RECORD NO.:  [id number]          PATIENT TYPE:  INP   LOCATION:  3738                         FACILITY:  [facility]   PHYSICIAN:  Mobolaji B. Bakare, M.D.DATE OF BIRTH:  05-Mar-1965   DATE OF ADMISSION:  12/02/2006  DATE OF DISCHARGE:  12/08/2006                               DISCHARGE SUMMARY   FINAL DIAGNOSES:  1. Acute on chronic congestive heart failure.  2. Nonischemic cardiomyopathy with ejection fraction of 15-20%.  3. Diabetes mellitus.  4. Hypertension.  5. Nonsustained ventricular tachycardia.  6. Anxiety.   PROCEDURES:  1. Cardiac catheterization done on December 05, 2006 showed no      significant obstructive coronary artery disease.  Please see report      for full details.  2. Chest x-ray done on December 02, 2006 showed mild congestive heart      failure.   HISTORY OF PRESENTING COMPLAINT:  [name] is a 45 year old the  African American male who was recently diagnosed with CHF in October  2008 and he was set up to follow up at [facility] for his care needs,  possibly cardiology evaluation as an outpatient.  The patient apparently  could not afford his medications and he did not follow up at  [facility], hence his symptoms got worse and he was readmitted on  December 02, 2006.   HOSPITAL COURSE:  1. Acute on chronic CHF.  The patient was started on his previous home      medications including Lasix.  Spironolactone was added.  He was      continued on ACE inhibitor and beta blockers.  The patient was seen      by cardiology.  It was felt prudent to evaluate his coronaries      given the multiple risk factors including hypertension and diabetes      mellitus.  Cardiac catheterization was negative for any obstructive      lesion.  The patient's medications were titrated as he tolerated.      The Lopressor was changed to Coreg and Lotensin was increased to 10      mg daily.  Digoxin was also  added to his medications.  The patient      was diuresing well and was a negative balance for the most part      during the course of this hospitalization.  His weight has improved      from 121.3 pounds to 117.2 pounds at the time of discharge.  The      patient has been instructed on daily weights and to reduce fluid      intake and salt intake.  2. Diabetes mellitus.  Hemoglobin A1c was 6.8.  Control was optimized      prior to discharge.  The patient was on Glucotrol 10 mg daily prior      to hospitalization. I believe he was not able to purchase this      medication and he was not compliant.  His hemoglobin A1c was in      fact fairly reasonable.  While  in the hospital he received insulin      treatment as well and blood glucose was well controlled.  We will      discharge him on Glucotrol as before and further optimization of      his medications per [facility].  3. Hypertension.  This was fairly controlled on the heart failure      regimen.  Blood pressure at the time of discharge was 119/83.  4. Nonsustained VT.  The patient had an episode of nonsustained VT      during the course of hospitalization.  Electrolytes were normal.      He is known to have a low EF and this is felt to be secondary to      nonischemic cardiomyopathy from probable alcohol abuse in the past.      He was seen by electrophysiologist and he will have a repeat 2-D      echocardiogram in 3 months and if ejection fraction is still less      than 35, consideration will be given to AICD/CRT.  5. Anxiety.  The patient demonstrated some degree of anxiety during      the course of hospitalization.  He was started on Paxil.   DISPOSITION:  The patient had an appointment with [facility] today.  He  will go home today and reschedule this appointment.  In terms of his  medications he does not qualify for further assistance in the hospital.  He has been given generic prescriptions that he can purchase from  [facility]  and this was explained to the patient.  He indicated that he has  figured out how he can pay for his medications.  He spoke to family  members and he got some reassurances for assistance until he follows up  at [facility].   DISCHARGE MEDICATIONS:  1. Benazepril 10 mg daily.  2. Coreg 12.5 mg b.i.d.  3. Spironolactone 25 mg daily.  4. Furosemide 40 mg b.i.d.  5. Glipizide 10 mg daily.  6. Digoxin 0.25 mg daily.  7. Pravastatin 40 mg daily.  8. Paxil 20 mg daily.  9. Prilosec OTC one daily.  10.Fioricet 1 or 2 tablets p.r.n. for headaches.  11.Aspirin 325 mg daily.   INSTRUCTIONS:  Weigh self daily and call office if he gains more than 4  pounds in three days.  Fluid restriction, low-salt diet.   FOLLOW-UP APPOINTMENTS:  1.  [facility] on Monday, December 12, 2006 for lab work.  2. Congestive heart failure clinic on December 19, 2006 at 4:00 p.m.  3. Follow up with Dr. Jens Som on December 30, 2006 at 3:15 p.m.  4. Follow up at [facility] in one week.      Mobolaji B. Corky Downs, M.D.  Electronically Signed     MBB/MEDQ  D:  12/08/2006  T:  12/09/2006  Job:  [id number]   cc:   Madolyn Frieze. Jens Som, MD, [facility]

## 2010-06-02 NOTE — Discharge Summary (Signed)
[name], [name]                ACCOUNT NO.:  [id number]   MEDICAL RECORD NO.:  [id number]          PATIENT TYPE:  INP   LOCATION:  2002                         FACILITY:  [facility]   PHYSICIAN:  Ladell Pier, M.D.   DATE OF BIRTH:  [birth year]   DATE OF ADMISSION:  11/02/2006  DATE OF DISCHARGE:  11/05/2006                               DISCHARGE SUMMARY   DISCHARGE DIAGNOSES:  1. Bilateral airspace opacities compatible with pneumonia.  2. Bilateral pneumonia.  Mostly in the left upper lobe.  The patient      to follow up in 1-2 months to evaluate resolution of pneumonia.  3. Small right pleural effusion.  4. Small pericardial effusion.  5. Multiple enlargement mediastinal lymph nodes; most likely reactive      from pneumonia.  6. Diabetes.  7. Hypertension.  8. Headache.  9. New onset congestive heart failure, with ejection fraction of 15-      20%.  10.Morbid obesity.   DISCHARGE MEDICATIONS:  1. Aspirin 81 mg daily.  2. Benazepril 5 mg daily.  3. Glipizide 10 mg daily.  4. Avelox 400 mg daily for 7 days.  5. Lasix 20 mg daily.  6. Toprol XL 25 mg daily.   FOLLOW-UP APPOINTMENTS:  The patient is to follow up at [facility].   PROCEDURES:  1. CT of the head done that was without contrast; this was negative.  2. CT angiogram of the chest done.  This showed no evidence of acute      PE.  Bilateral air space opacities compatible with pneumonia.  The      involvement is most significant in the left upper lobe.  Small      right pleural effusion and pericardial effusion; multiple enlarged      mediastinal lymph nodes, most likely reactive.  CT followup is      recommended; discussed with the patient.  3. The 2-D echocardiogram showed LVEF 15-20%; severe diffuse left      ventricular hypokinesis and mild MR   CONSULTANTS:  None.   HISTORY OF PRESENT ILLNESS:  The patient is a 45 year old male with 2  weeks of increasing shortness of breath and cough.  The cough was  productive of blood tinged sputum with intermittent fevers and chills.  The patient on the day of admission stated that he noticed bright red  blood in his stool, after being constipated for 3 days.  He also noticed  that his feet were swelling.  He reported diminished appetite, chronic  nausea without vomiting, abdominal pain that radiated across his lower  abdomen and chest (that worsened with deep inspiration).   Please see the admission H&P for remainder of history, past medical  history, family history, social history, medications, allergies and  review of systems.   PHYSICAL EXAMINATION:  On discharge:  Temperature 96.6, pulse 103,  respirations 18, blood pressure 118/91, pulse oximetry 98% on 2 liters.  CBG 97-149.  HEENT: Normocephalic, atraumatic.  Pupils reactive to light without  edema.  CARDIOVASCULAR:  Regular rate and rhythm.  LUNGS: Clear bilaterally.  ABDOMEN: Positive bowel sounds.  EXTREMITIES:  One-plus edema.   HOSPITAL COURSE:  1. PNEUMONIA.  The patient was admitted to the hospital; treated with      IV antibiotics, then he was switched to p.o. antibiotics.  His      breathing improved.  2. CONGESTIVE HEART FAILURE:  The patient has newly diagnosed      congestive heart failure, most likely secondary to hypertension.      He had cardiac enzymes done that were normal.  We started him on      medications for heart failure.  He will follow up with his primary      care physician for further management.  His EF is 15-20%.  We also      gave him Lasix while he was in the hospital.  That also improved      his shortness of breath.  His BNP on November 03, 2006 was 500.  The      patient will have the BNP rechecked as an outpatient; but he is      feeling fine after diuresis.  3. HYPERTENSION:  Blood pressure looks good.  Will continue him on an      ACE inhibitor and beta blocker.  4. DIABETES:  He was not taking any diabetes medication secondary to      cost.  We  started him on glipizide and he will reach the social      worker and they will help him to obtain medication.  He states that      he will go back to [facility].  5. HEADACHE:  He complained of some minimal headache.  He had a head      scan that was normal.   DISCHARGE LABS:  Respiratory culture: Normal oropharyngeal flora.  Sodium 138, potassium 4.9, chloride 104, CO2 30, BUN 14, creatinine  1.22, glucose 99, calcium 8.4.  WBC 6.4, hemoglobin 14.2, platelets 296.  CK 513, MB 2.4, troponin 0.05.  TSH 0.631.  Hemoglobin A1c 7.2.  D-dimer  0.59.   CHEST X-RAY:  2-D echocardiogram is as mentioned before.      Ladell Pier, M.D.  Electronically Signed     NJ/MEDQ  D:  11/05/2006  T:  11/07/2006  Job:  [id number]   cc:   Melvern Banker

## 2010-06-02 NOTE — Op Note (Signed)
NAMEJANUEL, DOOLAN                ACCOUNT NO.:  [id number]   MEDICAL RECORD NO.:  [id number]          PATIENT TYPE:  INP   LOCATION:  1430                         [facility]   PHYSICIAN:  Shirley Friar, MDDATE OF BIRTH:  [birth year]   DATE OF PROCEDURE:  DATE OF DISCHARGE:                               OPERATIVE REPORT   PROCEDURE:  Upper endoscopy.   INDICATIONS:  Hematemesis.   MEDICATIONS:  1. Fentanyl 50 mcg IV.  2. Versed 6 mg IV.  3. Cetacaine spray x2.   FINDINGS:  The endoscope was inserted.  The oropharynx and esophagus was  intubated, which was normal in its entirety.  The endoscope was passed  down to the stomach, which revealed greenish-white fluid without any  blood products or blood seen in the stomach.  Retroflexion was done,  which revealed a small, superficial ulcer in the gastric cardia with  black eschar.  The ulcer was approximately 4 mm in diameter.  The  endoscope was straightened and advanced into the duodenal bulb and  second portion of the duodenum, which were both normal.  The endoscope  was withdrawn to confirm the above findings.   ASSESSMENT:  1. Small proximal gastric ulcer with black eschar.  2. No active bleeding and no blood products seen in stomach.  3. No Mallory-Weiss tear.   PLAN:  1. Proton pump inhibitor therapy IV twice a day, until tolerating a      regular diet; and then change over to oral proton pump inhibitor      twice a day times 12 weeks.  After 12 weeks the patient will be on      a proton pump inhibitor daily.  2. Advance diet as tolerated.  3. The patient needs to avoid all NSAIDs.      Shirley Friar, MD  Electronically Signed     VCS/MEDQ  D:  10/01/2007  T:  10/01/2007  Job:  [id number]

## 2010-06-05 NOTE — Letter (Signed)
October 05, 2007     RE:  [name], [name]  MRN:  [id number]  /  DOB:  1965-12-27   To Whom It May Concern:   [name] is a patient of mine in [place], [place].  He has  been diagnosed with a nonischemic cardiomyopathy with an ejection  fraction of 25-35%.  He also is status post ICD.  He is on medical  management for his cardiomyopathy, but still has significant dyspnea on  exertion.  I do not feel that he will be able to be employed and this  will be for at least 12 months.  Please feel free to contact me if there  are any concerns or questions about this issue.    Sincerely,      Madolyn Frieze. Jens Som, MD, [facility]  Electronically Signed    BSC/MedQ  DD: 10/05/2007  DT: 10/06/2007  Job #: [id number]

## 2010-07-03 ENCOUNTER — Ambulatory Visit (INDEPENDENT_AMBULATORY_CARE_PROVIDER_SITE_OTHER): Payer: Medicare Other | Admitting: Internal Medicine

## 2010-07-03 ENCOUNTER — Encounter: Payer: Self-pay | Admitting: Internal Medicine

## 2010-07-03 DIAGNOSIS — K254 Chronic or unspecified gastric ulcer with hemorrhage: Secondary | ICD-10-CM | POA: Insufficient documentation

## 2010-07-03 DIAGNOSIS — I10 Essential (primary) hypertension: Secondary | ICD-10-CM

## 2010-07-03 DIAGNOSIS — F32A Depression, unspecified: Secondary | ICD-10-CM | POA: Insufficient documentation

## 2010-07-03 DIAGNOSIS — Z659 Problem related to unspecified psychosocial circumstances: Secondary | ICD-10-CM

## 2010-07-03 DIAGNOSIS — E119 Type 2 diabetes mellitus without complications: Secondary | ICD-10-CM

## 2010-07-03 DIAGNOSIS — I509 Heart failure, unspecified: Secondary | ICD-10-CM

## 2010-07-03 DIAGNOSIS — M549 Dorsalgia, unspecified: Secondary | ICD-10-CM

## 2010-07-03 DIAGNOSIS — Z658 Other specified problems related to psychosocial circumstances: Secondary | ICD-10-CM

## 2010-07-03 DIAGNOSIS — F329 Major depressive disorder, single episode, unspecified: Secondary | ICD-10-CM

## 2010-07-03 DIAGNOSIS — G43909 Migraine, unspecified, not intractable, without status migrainosus: Secondary | ICD-10-CM

## 2010-07-03 LAB — BASIC METABOLIC PANEL
Calcium: 8.8 mg/dL (ref 8.4–10.5)
Glucose, Bld: 168 mg/dL — ABNORMAL HIGH (ref 70–99)
Sodium: 140 mEq/L (ref 135–145)

## 2010-07-03 LAB — GLUCOSE, CAPILLARY: Glucose-Capillary: 219 mg/dL — ABNORMAL HIGH (ref 70–99)

## 2010-07-03 MED ORDER — ALPRAZOLAM 0.5 MG PO TBDP: 0.5000 mg | ORAL_TABLET | Freq: Every evening | ORAL | Status: DC | PRN

## 2010-07-03 MED ORDER — TRAMADOL HCL 50 MG PO TABS: 50.0000 mg | ORAL_TABLET | Freq: Four times a day (QID) | ORAL | Status: DC | PRN

## 2010-07-03 NOTE — Progress Notes (Signed)
  Subjective:    Patient ID: [name], male    DOB: 27-Nov-1965, 46 y.o.   MRN: [id number]  HPI This is a 45 year old male with past medical history significant for congestive heart failure, HTN and depression who presented for a regular follow up but noted that he feels very frustrated, overwhelmed since patient's wife left him with 3 children the youngest one is 75 years old. 1. Patient has sig. Difficulties to handle the situation. He is very anxious and has difficulties to sleep. He made an appointment with a psychiatrist on 06/26. He noted that he needs something to calm his nervous. 2.Patient further mentioned that he has sig. Back pain and migraine. The back pain is due to chronic lifting of weight for his work. He further noted that he has a long history of migraine. He tried Aleve which did not work. So he took hydrocodone from his mother in law which was helping with back pain and migraine.   3. Patient noted that he has been taking insulin on a regular basis but patient sometimes have lows in the 95 range he then would not take any insulin.  4. Patient noted that recent increase of lasix is well tolerated.   Review of Systems  Constitutional: Negative for fever and chills.  HENT: Negative for neck pain.   Respiratory: Negative for chest tightness, shortness of breath and wheezing.   Cardiovascular: Negative for chest pain.  Gastrointestinal: Negative for abdominal pain, constipation and abdominal distention.  Genitourinary: Negative for difficulty urinating.  Musculoskeletal: Positive for back pain.  Neurological: Positive for headaches.  Psychiatric/Behavioral: Positive for decreased concentration and agitation.       Objective:   Physical Exam  Constitutional: He is oriented to person, place, and time. He appears well-developed and well-nourished.  HENT:  Head: Normocephalic and atraumatic.  Neck: Neck supple.  Cardiovascular: Normal rate, regular rhythm and normal  heart sounds.   Pulmonary/Chest: Effort normal and breath sounds normal.  Abdominal: Soft. Bowel sounds are normal.  Musculoskeletal: Normal range of motion.       Lumbar back: He exhibits bony tenderness. He exhibits no swelling and no deformity.  Neurological: He is alert and oriented to person, place, and time. He has normal reflexes.  Psychiatric: His speech is normal. Thought content normal. His mood appears anxious. He is agitated and is hyperactive. He exhibits a depressed mood.          Assessment & Plan:

## 2010-07-04 DIAGNOSIS — G43909 Migraine, unspecified, not intractable, without status migrainosus: Secondary | ICD-10-CM | POA: Insufficient documentation

## 2010-07-04 NOTE — Assessment & Plan Note (Signed)
Patient has a history of depression. At this point the symptoms of anhedonia, feeling of helpless and overwhelm and anxious due to personal issues is worsening depression. Although the patient noted it is not helping I will refer change in management to Psychiatry ( App.06/26). Patient was also complaining about sig. Sexual dysfunction. May consider bupropion. To help with the anxiety I will start the patient on Ativan until the appointment. I will refer the patient to SW to access need at home with 3 children after wife the left the patient.
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
## 2010-07-04 NOTE — Assessment & Plan Note (Signed)
BP elevated during this office visit. Likely in the setting of acuter stressor. Will recheck at the next office visit. Continue current regimen.

## 2010-07-04 NOTE — Assessment & Plan Note (Addendum)
No history of migraine in [name] or centricity. Patient noted that tylenol is not working for him and since his personal situation is very stressful the migraine attacks are worse. Per patient he can not take any NSAIDS since GERD/Ulcer . I will not provide any opioids except Ultram at this point just for a max of 2 month until personal issues are ipmroving. c

## 2010-07-04 NOTE — Assessment & Plan Note (Signed)
Not volume overloaded. Lasix increased during last office visit. Tolerating it well. Continue current regimen. Will check Bmet today.

## 2010-07-07 ENCOUNTER — Telehealth: Payer: Self-pay | Admitting: *Deleted

## 2010-07-07 NOTE — Telephone Encounter (Signed)
Pt calls and states 1 of the meds he was given is not covered by insurance and he cannot afford to pay for it out of pocket, the other med tramadol is not helping w/ the pain. He is given an appt w/ dr Loistine Chance for this week.

## 2010-07-08 ENCOUNTER — Telehealth: Payer: Self-pay | Admitting: Licensed Clinical Social Worker

## 2010-07-08 ENCOUNTER — Ambulatory Visit: Payer: Medicare Other | Admitting: Internal Medicine

## 2010-07-09 ENCOUNTER — Ambulatory Visit (INDEPENDENT_AMBULATORY_CARE_PROVIDER_SITE_OTHER): Payer: Medicare Other | Admitting: Internal Medicine

## 2010-07-09 ENCOUNTER — Encounter: Payer: Self-pay | Admitting: Internal Medicine

## 2010-07-09 VITALS — BP 142/90 | HR 56 | Temp 97.4°F | Ht 72.0 in | Wt 267.5 lb

## 2010-07-09 DIAGNOSIS — I10 Essential (primary) hypertension: Secondary | ICD-10-CM

## 2010-07-09 DIAGNOSIS — M549 Dorsalgia, unspecified: Secondary | ICD-10-CM

## 2010-07-09 DIAGNOSIS — F329 Major depressive disorder, single episode, unspecified: Secondary | ICD-10-CM

## 2010-07-09 DIAGNOSIS — I509 Heart failure, unspecified: Secondary | ICD-10-CM

## 2010-07-09 DIAGNOSIS — E1169 Type 2 diabetes mellitus with other specified complication: Secondary | ICD-10-CM

## 2010-07-09 DIAGNOSIS — E782 Mixed hyperlipidemia: Secondary | ICD-10-CM

## 2010-07-09 LAB — GLUCOSE, CAPILLARY: Glucose-Capillary: 154 mg/dL — ABNORMAL HIGH (ref 70–99)

## 2010-07-09 MED ORDER — HYDROCODONE-ACETAMINOPHEN 5-500 MG PO TABS: 2.0000 | ORAL_TABLET | Freq: Four times a day (QID) | ORAL | Status: DC | PRN

## 2010-07-09 MED ORDER — FLUOXETINE HCL 40 MG PO CAPS: 40.0000 mg | ORAL_CAPSULE | Freq: Every day | ORAL | Status: DC

## 2010-07-09 MED ORDER — BENAZEPRIL HCL 40 MG PO TABS: 40.0000 mg | ORAL_TABLET | Freq: Every day | ORAL | Status: DC

## 2010-07-09 NOTE — Assessment & Plan Note (Signed)
Patient blood pressure is also elevated also during this office visit. On the basis that the patient has congestive heart failure with an EF of 20% I will increase his enalapril to 40 mg daily. At the next office visit I will obtain a basic metabolic panel.

## 2010-07-09 NOTE — Progress Notes (Signed)
  Subjective:    Patient ID: [name], male    DOB: [birth year], 45 y.o.   MRN: [id number]  HPI This is a 45 year old gentleman with posthitis E. significant for depression, hypertension, congestive heart failure, who presented again with persistent back pain and depression: #1 back pain: The pain is persistent. Ultrasound did not improve the symptoms. The patient noted that he had nausea with it. #2 migraine: Continue to have migraine. No vision changes, weakness numbness. #3 depression: Patient was not able to get at Ativan which is described at last office visit 2 to increased cost. Patient noted that his depression is not controlled. He is not able to sleep for a while. There are episodes where he is really down and  had thoughts about hurting himself but never had a plan. He repeated to me on multiple time that he does no have any plan and it is only so overwhelming what is going through at this point with taking care of 3 children by himself without any help. Whenever he sees his children he knows he has to be there for them.    Review of Systems  Constitutional: Negative for fever and chills.  Respiratory: Negative for apnea, cough, chest tightness and shortness of breath.   Cardiovascular: Negative for chest pain, palpitations and leg swelling.  Gastrointestinal: Positive for abdominal pain.  Musculoskeletal: Positive for back pain.  Neurological: Positive for headaches.  Psychiatric/Behavioral: Positive for decreased concentration.       Objective:   Physical Exam  Constitutional: He is oriented to person, place, and time. He appears well-developed.  HENT:  Head: Normocephalic and atraumatic.  Neck: Neck supple.  Cardiovascular: Normal rate and regular rhythm.   Pulmonary/Chest: Effort normal and breath sounds normal.  Abdominal: Soft. Bowel sounds are normal. He exhibits distension. There is no tenderness.  Musculoskeletal:       Lumbar back: He exhibits tenderness.    Neurological: He is alert and oriented to person, place, and time.  Skin: Skin is warm and dry.          Assessment & Plan:

## 2010-07-09 NOTE — Assessment & Plan Note (Signed)
Diabetes is well controlled. Will continue current regimen including Lantus 15 units and glipizide 10 mg daily.

## 2010-07-09 NOTE — Assessment & Plan Note (Signed)
Patient continues to have pain. The patient noted Ultram is not working for him but instead it gives him nausea. Reviewing his chart in centricity I am again not able to obtain any information about back pain although he noted that he has seen Dr. Reche Dixon in the past who has prescribed him Vicodin on a regular basis.  But he had not been prescribed it since one year since per patient Dr. Reche Dixon wanted to make sure that his headaches are not coming from high blood sugars. He noted that his blood sugars are now well controlled and he is doing what he is supposed to do. He was trying to get in contact with [facility] but they informed him that he  has to wait for 6 months to be seen since Dr. Debroah Baller had left. After discharge from the hospital he was referred to our clinic since he  thought that Dr. Debroah Baller is here.  Reviewing the E-chart an MRI was performed in 2006 which showd L5-S1 left paracentral disc protrusion abutting the left S1 root centrally and may contribute to S1 symptoms which was obtained for back pain at that time. No documentation what further management was initiated. I will obtain records from [facility]  At this point I will provide the patient Vicodin until he sees me on July 9. By then I should have old records from [facility]  Concerning  his back pain especially if further work up was initiated and what kind of management was done. I informed the patient  That he will not get any refills from me until I resolved this issue. Furthermore, I obtained a pain contract from him. I will check UDS today.

## 2010-07-09 NOTE — Assessment & Plan Note (Signed)
The patient continues to have significant depression this is most likely situational related since patient's wife left him with 3 children. At this point patient is overwhelmed and has occasionally thoughts about hurting himself. I repeatedly  asked the patient if he has any plans. He denies this. He noted there are a lot of  issues and so much is going on at this point but he notes that he needs  to be there for his children. He was not able to get Ativan as prescribed during the last office visit due to financial problem. I increased his Prozac to 40 mg daily. He will followup with psychiatrist on June 28. I underlined the importance to got  to this office visit.  He understood the situation and promised to go. I informed him if he needs anything  and he feels more depressed to call the office. I further will refer the patient to social worker again to evaluate his needs.

## 2010-07-10 ENCOUNTER — Encounter: Payer: Self-pay | Admitting: Internal Medicine

## 2010-07-10 DIAGNOSIS — N183 Chronic kidney disease, stage 3 unspecified: Secondary | ICD-10-CM | POA: Insufficient documentation

## 2010-07-10 DIAGNOSIS — N189 Chronic kidney disease, unspecified: Secondary | ICD-10-CM

## 2010-07-10 DIAGNOSIS — N179 Acute kidney failure, unspecified: Secondary | ICD-10-CM | POA: Insufficient documentation

## 2010-07-10 LAB — DRUGS OF ABUSE SCREEN W/O ALC, ROUTINE URINE
Amphetamine Screen, Ur: NEGATIVE
Barbiturate Quant, Ur: NEGATIVE
Benzodiazepines.: NEGATIVE
Cocaine Metabolites: NEGATIVE
Creatinine,U: 276.3 mg/dL
Marijuana Metabolite: POSITIVE — AB
Methadone: NEGATIVE
Opiate Screen, Urine: NEGATIVE
Phencyclidine (PCP): NEGATIVE
Propoxyphene: NEGATIVE

## 2010-07-15 ENCOUNTER — Encounter: Payer: Self-pay | Admitting: Licensed Clinical Social Worker

## 2010-07-15 NOTE — Progress Notes (Signed)
Letter sent with Soc[employer] Work card for patient to call me/Also info sent to patient re: [facility] who will do counseling related to child care and parenting issues.

## 2010-07-15 NOTE — Telephone Encounter (Signed)
Left message on 6/20 and again today. 07/15/10.  Sending information on [facility] and also my card.

## 2010-07-27 ENCOUNTER — Encounter: Payer: Self-pay | Admitting: Licensed Clinical Social Worker

## 2010-07-27 ENCOUNTER — Encounter: Payer: Self-pay | Admitting: Internal Medicine

## 2010-07-27 ENCOUNTER — Ambulatory Visit (INDEPENDENT_AMBULATORY_CARE_PROVIDER_SITE_OTHER): Payer: Medicare Other | Admitting: Internal Medicine

## 2010-07-27 DIAGNOSIS — E782 Mixed hyperlipidemia: Secondary | ICD-10-CM

## 2010-07-27 DIAGNOSIS — G629 Polyneuropathy, unspecified: Secondary | ICD-10-CM

## 2010-07-27 DIAGNOSIS — G609 Hereditary and idiopathic neuropathy, unspecified: Secondary | ICD-10-CM

## 2010-07-27 DIAGNOSIS — M549 Dorsalgia, unspecified: Secondary | ICD-10-CM

## 2010-07-27 DIAGNOSIS — R51 Headache: Secondary | ICD-10-CM

## 2010-07-27 DIAGNOSIS — G43909 Migraine, unspecified, not intractable, without status migrainosus: Secondary | ICD-10-CM

## 2010-07-27 DIAGNOSIS — E1169 Type 2 diabetes mellitus with other specified complication: Secondary | ICD-10-CM

## 2010-07-27 DIAGNOSIS — I509 Heart failure, unspecified: Secondary | ICD-10-CM

## 2010-07-27 DIAGNOSIS — F329 Major depressive disorder, single episode, unspecified: Secondary | ICD-10-CM

## 2010-07-27 DIAGNOSIS — I10 Essential (primary) hypertension: Secondary | ICD-10-CM

## 2010-07-27 LAB — GLUCOSE, CAPILLARY: Glucose-Capillary: 170 mg/dL — ABNORMAL HIGH (ref 70–99)

## 2010-07-27 MED ORDER — HYDROCODONE-ACETAMINOPHEN 10-325 MG PO TABS: 1.0000 | ORAL_TABLET | Freq: Four times a day (QID) | ORAL | Status: AC | PRN

## 2010-07-27 MED ORDER — GABAPENTIN 300 MG PO CAPS: 300.0000 mg | ORAL_CAPSULE | Freq: Three times a day (TID) | ORAL | Status: DC

## 2010-07-27 MED ORDER — FUROSEMIDE 80 MG PO TABS: 80.0000 mg | ORAL_TABLET | Freq: Three times a day (TID) | ORAL | Status: DC

## 2010-07-27 NOTE — Progress Notes (Signed)
Brochure for [employer] and info for [employer] left for [employer] and Dr. Loistine Chance to give to patient this PM for his appointment.  Attempted to call patient but no callback.  Also sent info via mail for [facility].

## 2010-07-28 DIAGNOSIS — G629 Polyneuropathy, unspecified: Secondary | ICD-10-CM | POA: Insufficient documentation

## 2010-07-28 DIAGNOSIS — R519 Headache, unspecified: Secondary | ICD-10-CM | POA: Insufficient documentation

## 2010-07-28 DIAGNOSIS — R51 Headache: Secondary | ICD-10-CM | POA: Insufficient documentation

## 2010-07-28 NOTE — Assessment & Plan Note (Signed)
Not addressed during this office visit.

## 2010-07-28 NOTE — Progress Notes (Signed)
  Subjective:    Patient ID: [name], male    DOB: [birth year], 45 y.o.   MRN: [id number]  HPI This is 45 year old male with PMH significant for Headache, Depression, CHF, DM who presented to the clinic for persistent Headache.  Patient noted that he continues to have headache almost on a daily basis. He was taking Vicodin 5-500 mg more then prescribed and noted that this not helping but whenever he takes the medication from his mother-in-law which vicodin 10 mg 2 tablets the headache resolves for 8 hours. He describes the headache as throbbing, in the frontal area associated with blurry vision and dizziness at that time. Then he needs to go to a dark room and take some pain medication which then relieves the headache. The headache come on almost on a daily basis. There are only few days where he does not have any headache.  He had migraine in the past. It is not associated with weakness, numbness or tingling but he noted that he is experiencing numbness since one month in his legs more on the right then left. He further mentioned that is experiencing swelling in his legs especially in the right leg.   Review of Systems  Constitutional: Positive for fatigue. Negative for fever and chills.  HENT: Negative for hearing loss, congestion, facial swelling, rhinorrhea, neck pain and postnasal drip.   Eyes: Positive for photophobia and visual disturbance. Negative for pain.  Respiratory: Negative for chest tightness, shortness of breath and wheezing.   Cardiovascular: Negative for chest pain and palpitations.  Gastrointestinal: Negative for abdominal distention.  Genitourinary: Negative for difficulty urinating.  Musculoskeletal: Positive for back pain.  Neurological: Positive for dizziness, numbness and headaches. Negative for syncope, facial asymmetry and weakness.  Psychiatric/Behavioral: Positive for decreased concentration.       Objective:   Physical Exam  Constitutional: He is oriented to  person, place, and time. He appears well-developed.  HENT:  Head: Normocephalic.  Eyes: Pupils are equal, round, and reactive to light.  Neck: Neck supple.  Cardiovascular: Normal rate, regular rhythm and normal heart sounds.   Pulmonary/Chest: Effort normal and breath sounds normal. No respiratory distress. He has no rales.  Abdominal: Soft. Bowel sounds are normal. He exhibits distension. There is no tenderness. There is no rebound.  Musculoskeletal: Normal range of motion. He exhibits edema.  Neurological: He is alert and oriented to person, place, and time. He has normal strength. No cranial nerve deficit or sensory deficit. He exhibits normal muscle tone. Gait abnormal. Coordination normal.  Skin: Skin is warm and dry.          Assessment & Plan:

## 2010-07-28 NOTE — Assessment & Plan Note (Addendum)
The Headache does not seem to be Migraine type headache. It may be opoid withdraw pain. Other DD include any central/neurological abnormalities although unlikely or chronic sinusitis. Therefore will obtain CT of head and sinus. The patient noted due to the headache he is not functioning at all and not able to take care of his family. He noted that most likely the reason why his wife left him the last time. I think the  depression  Is contributing to the headache.  I will provide the patient Norco 10 mg to prevent that patient is taking any other medication from other people. He was prescribed 72 tablets of Vicodin the last office visit on 6/21. He noted that he has taking almost all of it but did not bring in bottle. I will prescribe today 30 and the patient was informed that he needs to bring in the bottle at the next office visit. We will also wait until the CT was performed and I am still waiting for more records from  [facility] concerning pain medication. Until then I will not prescribe any more pain medication. At the next office the whole focus should be on headache to further evaluate source and possible management. I will come him back in 2 weeks.

## 2010-07-28 NOTE — Assessment & Plan Note (Signed)
Patient noted that it is ok. His wife came back. Dorothe Pea was not able to contact him. Patient noted that since the headache is bothering so  Much that can not say that his mood is better. He does not note any ideas of hurting any one.

## 2010-07-28 NOTE — Assessment & Plan Note (Signed)
On physical exam lower extremity edema notable. I will increase lasix to 3 times a day which has worked for the patient in the past. I further encouraged the patient to elevated his legs above his heart and decrease salt intake.  BP better controlled during this office visit. Will continue other medication regimen including Spironolactone and Lotensine. Patient has not been experiencing any chest pain or SOB.

## 2010-07-28 NOTE — Assessment & Plan Note (Signed)
Patient was complaining about pain radiating to the leg. 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 Will start the patient on Gabapentin today.

## 2010-08-03 ENCOUNTER — Inpatient Hospital Stay (HOSPITAL_COMMUNITY): Admission: RE | Admit: 2010-08-03 | Payer: Medicare Other | Source: Ambulatory Visit

## 2010-08-03 ENCOUNTER — Telehealth: Payer: Self-pay | Admitting: *Deleted

## 2010-08-03 NOTE — Telephone Encounter (Signed)
Pt called and he has missed ct appt, he is advised to call and reschedule, ph# given, also reminded of aug appt w/ dr Loistine Chance

## 2010-08-24 ENCOUNTER — Ambulatory Visit (INDEPENDENT_AMBULATORY_CARE_PROVIDER_SITE_OTHER): Payer: Medicare Other | Admitting: Internal Medicine

## 2010-08-24 ENCOUNTER — Encounter: Payer: Self-pay | Admitting: Internal Medicine

## 2010-08-24 DIAGNOSIS — I10 Essential (primary) hypertension: Secondary | ICD-10-CM

## 2010-08-24 DIAGNOSIS — L0390 Cellulitis, unspecified: Secondary | ICD-10-CM

## 2010-08-24 DIAGNOSIS — H663X9 Other chronic suppurative otitis media, unspecified ear: Secondary | ICD-10-CM

## 2010-08-24 DIAGNOSIS — R51 Headache: Secondary | ICD-10-CM

## 2010-08-24 DIAGNOSIS — E785 Hyperlipidemia, unspecified: Secondary | ICD-10-CM

## 2010-08-24 DIAGNOSIS — E1169 Type 2 diabetes mellitus with other specified complication: Secondary | ICD-10-CM

## 2010-08-24 DIAGNOSIS — E782 Mixed hyperlipidemia: Secondary | ICD-10-CM

## 2010-08-24 DIAGNOSIS — N529 Male erectile dysfunction, unspecified: Secondary | ICD-10-CM

## 2010-08-24 DIAGNOSIS — I509 Heart failure, unspecified: Secondary | ICD-10-CM

## 2010-08-24 LAB — GLUCOSE, CAPILLARY: Glucose-Capillary: 122 mg/dL — ABNORMAL HIGH (ref 70–99)

## 2010-08-24 LAB — LIPID PANEL
Cholesterol: 211 mg/dL — ABNORMAL HIGH (ref 0–200)
LDL Cholesterol: 152 mg/dL — ABNORMAL HIGH (ref 0–99)
Total CHOL/HDL Ratio: 5.4 Ratio
VLDL: 20 mg/dL (ref 0–40)

## 2010-08-24 LAB — CBC
Hemoglobin: 16.4 g/dL (ref 13.0–17.0)
Platelets: 277 10*3/uL (ref 150–400)
RBC: 5.47 MIL/uL (ref 4.22–5.81)
WBC: 6.3 10*3/uL (ref 4.0–10.5)

## 2010-08-24 LAB — BASIC METABOLIC PANEL
Calcium: 9 mg/dL (ref 8.4–10.5)
Creat: 1.37 mg/dL — ABNORMAL HIGH (ref 0.50–1.35)

## 2010-08-24 MED ORDER — METOPROLOL TARTRATE 50 MG PO TABS: 50.0000 mg | ORAL_TABLET | Freq: Two times a day (BID) | ORAL | Status: DC

## 2010-08-24 MED ORDER — AMITRIPTYLINE HCL 10 MG PO TABS: 10.0000 mg | ORAL_TABLET | Freq: Every day | ORAL | Status: DC

## 2010-08-24 MED ORDER — SILDENAFIL CITRATE 25 MG PO TABS: 25.0000 mg | ORAL_TABLET | Freq: Every day | ORAL | Status: DC | PRN

## 2010-08-24 MED ORDER — DOXYCYCLINE HYCLATE 100 MG PO TABS: 100.0000 mg | ORAL_TABLET | Freq: Two times a day (BID) | ORAL | Status: AC

## 2010-08-24 NOTE — Assessment & Plan Note (Signed)
Patient noted that his CBG has been in 120s in the morning and therefore he has been no using his Insulin since 3 weeks. Todays CBG is 122 after eating a snack.  Last Hgb A1c in August was 7.1. Will continue Glucotrol and hold of on Lantus at this point. Will continue to monitor CBG and Hgb A1c. Patient may need to be restarted on Lantus. He may just need a lowe dose.

## 2010-08-24 NOTE — Progress Notes (Signed)
  Subjective:    Patient ID: [name], male    DOB: July 13, 1965, 45 y.o.   MRN: [id number]  HPI This is a 45 year old male with PMH as outlines above who presented to the clinic with swelling of left arm, continues Headache and Erectile Dysfunction. 1. Swelling of left arm: Patient noted that it started on Thursday. He woke in the morning with the swelling in his elbow and arm. He noted the elbow is severely tender and is difficulty in bending and lifting the arm due to the pain. He mentioned that he may have been bitten by a spider since he has been recently cleaning the house and he killed a lot of spiders. He mentioned he had one episode of emesis on Friday evening but otherwise denies any fevers or chills, rash,  Trauma or excessive movement of the arm. He has never experienced a joint swelling in the past.  2. Headache is better controlled since he is taking Narco 10 mg 2 tablet at one time but now he would like to have more of it.  3. Erectile Dysfunction: Patient noted that he does experience desire but is not able to have erection. This is has been going for some time. He was prescribed 2 years ago Viagra by his Cardiologist and that really helped him.  4. DM: Since his blood sugars are running around 120 in the morning he has not been using any Insulin since 3 weeks.    Review of Systems  Constitutional: Negative for fever, chills and appetite change.  Respiratory: Negative for cough and shortness of breath.   Cardiovascular: Positive for leg swelling. Negative for chest pain and palpitations.  Gastrointestinal: Negative for abdominal pain.  Genitourinary: Negative for discharge, penile swelling, scrotal swelling, penile pain and testicular pain.  Musculoskeletal: Positive for back pain.  Skin: Negative for rash and wound.  Neurological: Positive for headaches. Negative for weakness and numbness.       Objective:   Physical Exam  Constitutional: He is oriented to person, place,  and time. He appears well-developed.  HENT:  Head: Normocephalic.  Neck: Neck supple.  Cardiovascular: Normal rate and regular rhythm.   Pulmonary/Chest: Effort normal. He has no wheezes.  Abdominal: Soft. Bowel sounds are normal. He exhibits distension. There is no tenderness.  Musculoskeletal: He exhibits edema.       Left elbow: He exhibits swelling and deformity. He exhibits normal range of motion, no effusion and no laceration. tenderness found.       Left wrist: He exhibits normal range of motion, no tenderness, no swelling and no effusion.       Arms: Neurological: He is alert and oriented to person, place, and time.  Skin: Skin is warm. No rash noted.          Assessment & Plan:

## 2010-08-24 NOTE — Assessment & Plan Note (Signed)
Swelling of the left arm likely due to cellulitis. It may have been associated with a spider bite. It seems that there is increased risk for MRSA infection after spider bite. It is noted more an association rather then the cause. Will start Doxycycline for 7 days. Informed the patient if the patient is not noticing a significant change of the symptoms in the arm by Thursday he needs to be reevaluated in the clinic ASAP.

## 2010-08-24 NOTE — Assessment & Plan Note (Addendum)
Will start on Viagra. D/CNitro and informed the risk if taking both medication together.   Update 08/09: Viagra changed to Levitra due to cost.

## 2010-08-24 NOTE — Assessment & Plan Note (Signed)
Will d/c Nitro  And patient informed that he needs to throw it away since patient will be started on Viagra for erectile dysfunction. Furthermore will d/c Coreg and start Metoprolol to also help chronic headache. Will re-evaluate BP at the next office visit.

## 2010-08-24 NOTE — Patient Instructions (Signed)
1. Throw away Nitro if you are using Viagra. 2. Please call the clinic on Thursday if you are not noticing any changes with your arm. 3. Please take the medication as prescribed.

## 2010-08-24 NOTE — Assessment & Plan Note (Addendum)
Will check lipid panel today.   Update: Cholesterol continues to be elevated. Goal of <70. Will d/c Zocor 40 mg and will start Crestor 10 mg daily. Per pharmacy this should be covered by insurance. Will check LFT in 6 weeks.
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
## 2010-08-25 ENCOUNTER — Telehealth: Payer: Self-pay | Admitting: *Deleted

## 2010-08-25 NOTE — Telephone Encounter (Signed)
Received fax from [facility] about new rx for Viagra.   Pt.'s has Part D insurance which does not cover any medications in this class.  Stated Levitra is the cheapest of the 3 (Viagra and Cialis).  Please advise   Thanks

## 2010-08-27 ENCOUNTER — Telehealth: Payer: Self-pay | Admitting: *Deleted

## 2010-08-27 MED ORDER — ROSUVASTATIN CALCIUM 10 MG PO TABS: 10.0000 mg | ORAL_TABLET | Freq: Every day | ORAL | Status: DC

## 2010-08-27 MED ORDER — VARDENAFIL HCL 10 MG PO TABS: 10.0000 mg | ORAL_TABLET | Freq: Every day | ORAL | Status: AC | PRN

## 2010-08-27 NOTE — Progress Notes (Signed)
Addended by: Almyra Deforest on: 08/27/2010 11:36 AM   Modules accepted: Orders

## 2010-08-27 NOTE — Telephone Encounter (Signed)
I called all 3 numbers in pt.'s chart; they are either disconnected or no longer in service.  Also I called his pharmacy; they have the same home #.

## 2010-08-27 NOTE — Telephone Encounter (Signed)
Message copied by Hassan Buckler on Thu Aug 27, 2010  3:56 PM ------      Message from: Almyra Deforest      Created: Thu Aug 27, 2010 12:38 PM      Regarding: Change medication       Can you please call Mr Deupree and inform him that will d/c simvastatin and will start Crestor 10 mg . It is send to the pharmacy. It should be covered by the insurance.             Thanks,            Almyra Deforest

## 2010-08-27 NOTE — Progress Notes (Deleted)
  Subjective:    Patient ID: [name], male    DOB: 02-Feb-1965, 45 y.o.   MRN: [id number]  HPI    Review of Systems     Objective:   Physical Exam        Assessment & Plan:

## 2010-08-27 NOTE — Telephone Encounter (Signed)
Changed to Levitra per Dr Loistine Chance.

## 2010-08-27 NOTE — Progress Notes (Signed)
Addended by: Almyra Deforest on: 08/27/2010 12:38 PM   Modules accepted: Orders

## 2010-08-28 NOTE — Telephone Encounter (Signed)
Called [phone] - pt had called yesterday and left a message.  I called this number ; informed pt of new med:  Crestor and Levitra per Dr. Loistine Chance.

## 2010-08-31 ENCOUNTER — Encounter: Payer: Medicare Other | Admitting: Internal Medicine

## 2010-09-07 ENCOUNTER — Other Ambulatory Visit: Payer: Self-pay | Admitting: Internal Medicine

## 2010-09-07 DIAGNOSIS — I10 Essential (primary) hypertension: Secondary | ICD-10-CM

## 2010-10-07 LAB — DIFFERENTIAL
Lymphocytes Relative: 25
Lymphs Abs: 1.7
Monocytes Relative: 8
Neutro Abs: 4.4
Neutrophils Relative %: 63

## 2010-10-07 LAB — BASIC METABOLIC PANEL
Calcium: 9
Creatinine, Ser: 1
GFR calc Af Amer: >60
Sodium: 135

## 2010-10-07 LAB — INFLUENZA A+B VIRUS AG-DIRECT(RAPID)
Inflenza A Ag: NEGATIVE
Influenza B Ag: NEGATIVE

## 2010-10-07 LAB — CBC
Hemoglobin: 15.3
RBC: 5.31
WBC: 7

## 2010-10-13 LAB — HEPATIC FUNCTION PANEL
AST: 26
Bilirubin, Direct: <0.1
Total Bilirubin: 0.8

## 2010-10-13 LAB — LIPID PANEL
HDL: 38 — ABNORMAL LOW
Total CHOL/HDL Ratio: 7.2
Triglycerides: 237 — ABNORMAL HIGH
VLDL: 47 — ABNORMAL HIGH

## 2010-10-13 LAB — CBC
HCT: 45
Hemoglobin: 16.1
MCHC: 35.7
MCV: 88.7
Platelets: 232
RBC: 5.07
RDW: 13.9
WBC: 5.7

## 2010-10-13 LAB — APTT: aPTT: 25

## 2010-10-13 LAB — BASIC METABOLIC PANEL WITH GFR
BUN: 14
CO2: 20
Calcium: 9.2
Chloride: 106
Creatinine, Ser: 1.05
GFR calc non Af Amer: >60
Glucose, Bld: 335 — ABNORMAL HIGH
Potassium: 4.4
Sodium: 135

## 2010-10-13 LAB — PROTIME-INR: Prothrombin Time: 13.1

## 2010-10-16 LAB — POCT I-STAT, CHEM 8
Creatinine, Ser: 1.5
HCT: 44
Hemoglobin: 15
Potassium: 4.2
Sodium: 136

## 2010-10-16 LAB — CBC
MCHC: 34
RBC: 4.87

## 2010-10-16 LAB — DIFFERENTIAL
Basophils Absolute: 0
Basophils Relative: 1
Eosinophils Absolute: 0.3
Lymphocytes Relative: 31
Neutrophils Relative %: 49

## 2010-10-21 LAB — URINE CULTURE

## 2010-10-21 LAB — CROSSMATCH
ABO/RH(D): B POS
Antibody Screen: NEGATIVE

## 2010-10-21 LAB — CARDIAC PANEL(CRET KIN+CKTOT+MB+TROPI)
Total CK: 228
Troponin I: 0.02
Troponin I: 0.03

## 2010-10-21 LAB — HEPATIC FUNCTION PANEL
ALT: 20
Albumin: 3.4 — ABNORMAL LOW
Alkaline Phosphatase: 103
Total Protein: 6.6

## 2010-10-21 LAB — CBC
HCT: 49.8
Hemoglobin: 17.1 — ABNORMAL HIGH
MCHC: 34.6
MCV: 89.5
Platelets: 229
RBC: 4.93
RBC: 5.55
WBC: 9.2

## 2010-10-21 LAB — GLUCOSE, CAPILLARY
Glucose-Capillary: 190 — ABNORMAL HIGH
Glucose-Capillary: 191 — ABNORMAL HIGH
Glucose-Capillary: 230 — ABNORMAL HIGH
Glucose-Capillary: 236 — ABNORMAL HIGH
Glucose-Capillary: 237 — ABNORMAL HIGH
Glucose-Capillary: 334 — ABNORMAL HIGH

## 2010-10-21 LAB — PROTIME-INR
INR: 0.9
Prothrombin Time: 12.3

## 2010-10-21 LAB — BASIC METABOLIC PANEL
Calcium: 8.8
Creatinine, Ser: 1.05
GFR calc Af Amer: >60
GFR calc Af Amer: >60
GFR calc non Af Amer: >60
GFR calc non Af Amer: >60
Potassium: 4.4
Sodium: 133 — ABNORMAL LOW

## 2010-10-21 LAB — DIFFERENTIAL
Eosinophils Relative: 3
Lymphocytes Relative: 17
Lymphs Abs: 1.6
Monocytes Absolute: 0.4

## 2010-10-21 LAB — DIGOXIN LEVEL: Digoxin Level: 0.2 — ABNORMAL LOW

## 2010-10-21 LAB — POCT I-STAT, CHEM 8
BUN: 11
Calcium, Ion: 1.16
Chloride: 105
Hemoglobin: 17.7 — ABNORMAL HIGH
Potassium: 4.8
TCO2: 24

## 2010-10-21 LAB — URINALYSIS, ROUTINE W REFLEX MICROSCOPIC
Bilirubin Urine: NEGATIVE
Nitrite: NEGATIVE
Protein, ur: >300 — AB
Specific Gravity, Urine: 1.021
Urobilinogen, UA: 0.2

## 2010-10-21 LAB — ABO/RH: ABO/RH(D): B POS

## 2010-10-21 LAB — HEMOGLOBIN AND HEMATOCRIT, BLOOD
HCT: 43.4
HCT: 44.8
Hemoglobin: 14.9

## 2010-10-21 LAB — APTT: aPTT: 26

## 2010-10-21 LAB — H. PYLORI ANTIBODY, IGG: H Pylori IgG: <0.4

## 2010-10-26 LAB — POCT CARDIAC MARKERS
Myoglobin, poc: 100
Operator id: 257131

## 2010-10-26 LAB — BASIC METABOLIC PANEL
CO2: 23
Chloride: 104
Creatinine, Ser: 0.95
GFR calc Af Amer: >60

## 2010-10-26 LAB — I-STAT 8, (EC8 V) (CONVERTED LAB)
Chloride: 107
HCT: 51
Hemoglobin: 17.3 — ABNORMAL HIGH
Operator id: 257131
Potassium: 4.1
Sodium: 140
pH, Ven: 7.352 — ABNORMAL HIGH

## 2010-10-26 LAB — POCT I-STAT CREATININE: Creatinine, Ser: 1.1

## 2010-10-27 LAB — URINALYSIS, ROUTINE W REFLEX MICROSCOPIC
Leukocytes, UA: NEGATIVE
Nitrite: NEGATIVE
Specific Gravity, Urine: 1.015
Urobilinogen, UA: 1

## 2010-10-27 LAB — BASIC METABOLIC PANEL
BUN: 11
BUN: 12
BUN: 12
BUN: 12
CO2: 28
CO2: 30
CO2: 31
Calcium: 8.6
Calcium: 8.6
Chloride: 103
Chloride: 104
Chloride: 106
Chloride: 99
Creatinine, Ser: 1.21
Creatinine, Ser: 1.39
GFR calc Af Amer: >60
GFR calc non Af Amer: 57 — ABNORMAL LOW
GFR calc non Af Amer: >60
Glucose, Bld: 201 — ABNORMAL HIGH
Glucose, Bld: 223 — ABNORMAL HIGH
Glucose, Bld: 98
Potassium: 4
Potassium: 4.1
Potassium: 5.4 — ABNORMAL HIGH
Sodium: 135

## 2010-10-27 LAB — CK TOTAL AND CKMB (NOT AT ARMC)
CK, MB: 3
Total CK: 228

## 2010-10-27 LAB — POCT I-STAT 3, VENOUS BLOOD GAS (G3P V)
O2 Saturation: 66
TCO2: 25
pCO2, Ven: 42.6 — ABNORMAL LOW

## 2010-10-27 LAB — POCT I-STAT 3, ART BLOOD GAS (G3+)
Bicarbonate: 24.2 — ABNORMAL HIGH
O2 Saturation: 93
Operator id: 284701
Patient temperature: 98
TCO2: 25
pO2, Arterial: 63 — ABNORMAL LOW

## 2010-10-27 LAB — RHEUMATOID FACTOR: Rhuematoid fact SerPl-aCnc: <20

## 2010-10-27 LAB — CBC
HCT: 45
HCT: 46.3
MCHC: 33.3
MCV: 86.3
MCV: 87.8
MCV: 86.4
Platelets: 240
Platelets: 242
RBC: 5.36
RDW: 14.4
RDW: 14.6
WBC: 6.6

## 2010-10-27 LAB — COMPREHENSIVE METABOLIC PANEL
ALT: 33
BUN: 13
CO2: 26
Calcium: 8.9
GFR calc non Af Amer: >60
Glucose, Bld: 162 — ABNORMAL HIGH
Sodium: 139

## 2010-10-27 LAB — I-STAT 8, (EC8 V) (CONVERTED LAB)
BUN: 15
Bicarbonate: 22.5
Chloride: 108
Glucose, Bld: 137 — ABNORMAL HIGH
Hemoglobin: 17
Sodium: 140
pCO2, Ven: 40.6 — ABNORMAL LOW

## 2010-10-27 LAB — DIFFERENTIAL
Eosinophils Absolute: 0.2
Eosinophils Relative: 4
Lymphs Abs: 2.8
Monocytes Relative: 8

## 2010-10-27 LAB — URINE MICROSCOPIC-ADD ON

## 2010-10-27 LAB — HEMOGLOBIN A1C
Hgb A1c MFr Bld: 7.3 — ABNORMAL HIGH
Mean Plasma Glucose: 183

## 2010-10-27 LAB — POCT CARDIAC MARKERS
CKMB, poc: 1.2
Myoglobin, poc: 88.1
Operator id: 272551
Troponin i, poc: <0.05

## 2010-10-27 LAB — CARDIAC PANEL(CRET KIN+CKTOT+MB+TROPI)
Total CK: 178
Troponin I: 0.03

## 2010-10-27 LAB — LIPID PANEL
HDL: 33 — ABNORMAL LOW
Triglycerides: 133
VLDL: 27

## 2010-10-27 LAB — ANA: Anti Nuclear Antibody(ANA): NEGATIVE

## 2010-10-27 LAB — FERRITIN: Ferritin: 113 (ref 22–322)

## 2010-10-27 LAB — RAPID URINE DRUG SCREEN, HOSP PERFORMED
Barbiturates: NOT DETECTED
Benzodiazepines: NOT DETECTED

## 2010-10-27 LAB — TSH: TSH: 1.669

## 2010-10-28 LAB — CBC
HCT: 43.4
Hemoglobin: 15.3
Hemoglobin: 15.8
MCHC: 33.2
MCHC: 33.4
MCHC: 34.1
MCV: 86.7
MCV: 87.4
MCV: 87.8
Platelets: 285
RBC: 4.9
RBC: 4.92
RBC: 4.96
RBC: 5.49
RBC: 5.13
RDW: 13.9
RDW: 13.9
WBC: 13.2 — ABNORMAL HIGH
WBC: 4.5
WBC: 11.7 — ABNORMAL HIGH

## 2010-10-28 LAB — POCT CARDIAC MARKERS
CKMB, poc: 1.7
CKMB, poc: 1.2
Myoglobin, poc: 133
Myoglobin, poc: 230
Troponin i, poc: <0.05
Troponin i, poc: <0.05

## 2010-10-28 LAB — LIPID PANEL
HDL: 30 — ABNORMAL LOW
Total CHOL/HDL Ratio: 5.7
VLDL: 18

## 2010-10-28 LAB — I-STAT 8, (EC8 V) (CONVERTED LAB)
BUN: 10
Bicarbonate: 25.6 — ABNORMAL HIGH
Chloride: 107
Glucose, Bld: 112 — ABNORMAL HIGH
Hemoglobin: 17.7 — ABNORMAL HIGH
pCO2, Ven: 45.3
pH, Ven: 7.361 — ABNORMAL HIGH

## 2010-10-28 LAB — CULTURE, BLOOD (ROUTINE X 2)

## 2010-10-28 LAB — BASIC METABOLIC PANEL
BUN: 10
BUN: 9
CO2: 29
CO2: 30
CO2: 30
Calcium: 8.4
Calcium: 8.6
Calcium: 8.6
Chloride: 100
Chloride: 99
Creatinine, Ser: 1.22
Creatinine, Ser: 1.33
Creatinine, Ser: 1.33
GFR calc Af Amer: >60
GFR calc Af Amer: >60
GFR calc non Af Amer: >60
Glucose, Bld: 83
Glucose, Bld: 155 — ABNORMAL HIGH
Potassium: 4.1
Sodium: 137

## 2010-10-28 LAB — DIFFERENTIAL
Basophils Absolute: 0
Basophils Absolute: 0
Basophils Relative: 1
Lymphocytes Relative: 13
Lymphs Abs: 1
Monocytes Absolute: 0.7
Monocytes Relative: 11
Neutro Abs: 3
Neutro Abs: 10.3 — ABNORMAL HIGH
Neutrophils Relative %: 66

## 2010-10-28 LAB — CULTURE, RESPIRATORY W GRAM STAIN

## 2010-10-28 LAB — B-NATRIURETIC PEPTIDE (CONVERTED LAB)
Pro B Natriuretic peptide (BNP): 441 — ABNORMAL HIGH
Pro B Natriuretic peptide (BNP): 500 — ABNORMAL HIGH

## 2010-10-28 LAB — CK TOTAL AND CKMB (NOT AT ARMC): CK, MB: 1.9

## 2010-10-28 LAB — HEMOGLOBIN A1C
Hgb A1c MFr Bld: 7.2 — ABNORMAL HIGH
Mean Plasma Glucose: 179

## 2010-10-28 LAB — TROPONIN I: Troponin I: 0.08 — ABNORMAL HIGH

## 2010-10-28 LAB — D-DIMER, QUANTITATIVE: D-Dimer, Quant: 0.59 — ABNORMAL HIGH

## 2010-10-28 LAB — CARDIAC PANEL(CRET KIN+CKTOT+MB+TROPI): Troponin I: 0.05

## 2010-12-08 ENCOUNTER — Other Ambulatory Visit: Payer: Self-pay | Admitting: *Deleted

## 2010-12-08 DIAGNOSIS — F329 Major depressive disorder, single episode, unspecified: Secondary | ICD-10-CM

## 2010-12-08 DIAGNOSIS — E785 Hyperlipidemia, unspecified: Secondary | ICD-10-CM

## 2010-12-08 DIAGNOSIS — I509 Heart failure, unspecified: Secondary | ICD-10-CM

## 2010-12-08 DIAGNOSIS — I10 Essential (primary) hypertension: Secondary | ICD-10-CM

## 2010-12-08 DIAGNOSIS — R51 Headache: Secondary | ICD-10-CM

## 2010-12-08 DIAGNOSIS — E1169 Type 2 diabetes mellitus with other specified complication: Secondary | ICD-10-CM

## 2010-12-08 MED ORDER — FUROSEMIDE 80 MG PO TABS: 80.0000 mg | ORAL_TABLET | Freq: Every day | ORAL | Status: DC

## 2010-12-08 MED ORDER — FLUOXETINE HCL 40 MG PO CAPS: 40.0000 mg | ORAL_CAPSULE | Freq: Every day | ORAL | Status: DC

## 2010-12-08 MED ORDER — AMITRIPTYLINE HCL 10 MG PO TABS: 10.0000 mg | ORAL_TABLET | Freq: Every day | ORAL | Status: DC

## 2010-12-08 MED ORDER — BENAZEPRIL HCL 40 MG PO TABS: 40.0000 mg | ORAL_TABLET | Freq: Every day | ORAL | Status: DC

## 2010-12-08 MED ORDER — GABAPENTIN 300 MG PO CAPS: 300.0000 mg | ORAL_CAPSULE | Freq: Three times a day (TID) | ORAL | Status: DC

## 2010-12-08 MED ORDER — ROSUVASTATIN CALCIUM 10 MG PO TABS
10.0000 mg | ORAL_TABLET | Freq: Every day | ORAL | Status: DC
Start: 2010-12-08 — End: 2011-02-13

## 2010-12-09 NOTE — Telephone Encounter (Signed)
Refills [facility] to [facility] [address] Angelina Ok, RN 12/09/2010.

## 2010-12-11 ENCOUNTER — Encounter: Payer: Self-pay | Admitting: Internal Medicine

## 2010-12-16 ENCOUNTER — Other Ambulatory Visit: Payer: Self-pay | Admitting: *Deleted

## 2010-12-16 DIAGNOSIS — I509 Heart failure, unspecified: Secondary | ICD-10-CM

## 2010-12-17 MED ORDER — GLIPIZIDE 10 MG PO TABS: 10.0000 mg | ORAL_TABLET | Freq: Every day | ORAL | Status: DC

## 2010-12-17 MED ORDER — SPIRONOLACTONE 25 MG PO TABS: 25.0000 mg | ORAL_TABLET | Freq: Every day | ORAL | Status: DC

## 2011-02-07 ENCOUNTER — Other Ambulatory Visit: Payer: Self-pay | Admitting: Internal Medicine

## 2011-02-13 ENCOUNTER — Emergency Department (HOSPITAL_COMMUNITY): Payer: Medicare Other

## 2011-02-13 ENCOUNTER — Emergency Department (HOSPITAL_COMMUNITY)
Admission: EM | Admit: 2011-02-13 | Discharge: 2011-02-13 | Disposition: A | Discharge status: Home / Self Care | Payer: Medicare Other | Attending: Emergency Medicine | Admitting: Emergency Medicine

## 2011-02-13 ENCOUNTER — Encounter (HOSPITAL_COMMUNITY): Payer: Self-pay | Admitting: *Deleted

## 2011-02-13 ENCOUNTER — Other Ambulatory Visit: Payer: Self-pay

## 2011-02-13 DIAGNOSIS — R630 Anorexia: Secondary | ICD-10-CM | POA: Insufficient documentation

## 2011-02-13 DIAGNOSIS — E119 Type 2 diabetes mellitus without complications: Secondary | ICD-10-CM | POA: Insufficient documentation

## 2011-02-13 DIAGNOSIS — E860 Dehydration: Secondary | ICD-10-CM | POA: Insufficient documentation

## 2011-02-13 DIAGNOSIS — R062 Wheezing: Secondary | ICD-10-CM | POA: Insufficient documentation

## 2011-02-13 DIAGNOSIS — Z79899 Other long term (current) drug therapy: Secondary | ICD-10-CM | POA: Insufficient documentation

## 2011-02-13 DIAGNOSIS — I509 Heart failure, unspecified: Secondary | ICD-10-CM | POA: Insufficient documentation

## 2011-02-13 DIAGNOSIS — IMO0001 Reserved for inherently not codable concepts without codable children: Secondary | ICD-10-CM | POA: Insufficient documentation

## 2011-02-13 DIAGNOSIS — R059 Cough, unspecified: Secondary | ICD-10-CM | POA: Insufficient documentation

## 2011-02-13 DIAGNOSIS — Z794 Long term (current) use of insulin: Secondary | ICD-10-CM | POA: Insufficient documentation

## 2011-02-13 DIAGNOSIS — J069 Acute upper respiratory infection, unspecified: Secondary | ICD-10-CM

## 2011-02-13 DIAGNOSIS — R05 Cough: Secondary | ICD-10-CM | POA: Insufficient documentation

## 2011-02-13 DIAGNOSIS — R6883 Chills (without fever): Secondary | ICD-10-CM | POA: Insufficient documentation

## 2011-02-13 DIAGNOSIS — I10 Essential (primary) hypertension: Secondary | ICD-10-CM | POA: Insufficient documentation

## 2011-02-13 HISTORY — DX: Essential (primary) hypertension: I10

## 2011-02-13 HISTORY — DX: Heart failure, unspecified: I50.9

## 2011-02-13 LAB — POCT I-STAT, CHEM 8
BUN: 20 mg/dL (ref 6–23)
Creatinine, Ser: 1.3 mg/dL (ref 0.50–1.35)
Glucose, Bld: 124 mg/dL — ABNORMAL HIGH (ref 70–99)
Hemoglobin: 15.3 g/dL (ref 13.0–17.0)
Potassium: 3.8 mEq/L (ref 3.5–5.1)
Sodium: 141 mEq/L (ref 135–145)

## 2011-02-13 LAB — URINALYSIS, ROUTINE W REFLEX MICROSCOPIC
Glucose, UA: NEGATIVE mg/dL
Ketones, ur: 15 mg/dL — AB
Leukocytes, UA: NEGATIVE
Nitrite: NEGATIVE
Protein, ur: >300 mg/dL — AB
Specific Gravity, Urine: 1.029 (ref 1.005–1.030)
Urobilinogen, UA: 4 mg/dL — ABNORMAL HIGH (ref 0.0–1.0)
pH: 6 (ref 5.0–8.0)

## 2011-02-13 LAB — URINE MICROSCOPIC-ADD ON

## 2011-02-13 LAB — GLUCOSE, CAPILLARY
Glucose-Capillary: 145 mg/dL — ABNORMAL HIGH (ref 70–99)
Glucose-Capillary: 114 mg/dL — ABNORMAL HIGH (ref 70–99)

## 2011-02-13 LAB — POCT I-STAT TROPONIN I

## 2011-02-13 MED ORDER — ALBUTEROL SULFATE HFA 108 (90 BASE) MCG/ACT IN AERS
2.0000 | INHALATION_SPRAY | Freq: Four times a day (QID) | RESPIRATORY_TRACT | Status: DC
  Administered 2011-02-13: 2 via RESPIRATORY_TRACT
  Filled 2011-02-13: qty 6.7

## 2011-02-13 MED ORDER — PROMETHAZINE-DM 6.25-15 MG/5ML PO SYRP: 5.0000 mL | ORAL_SOLUTION | ORAL | Status: AC | PRN

## 2011-02-13 MED ORDER — ALBUTEROL SULFATE (5 MG/ML) 0.5% IN NEBU
5.0000 mg | INHALATION_SOLUTION | Freq: Once | RESPIRATORY_TRACT | Status: AC
  Administered 2011-02-13: 5 mg via RESPIRATORY_TRACT
  Filled 2011-02-13: qty 1

## 2011-02-13 MED ORDER — KETOROLAC TROMETHAMINE 30 MG/ML IJ SOLN
INTRAMUSCULAR | Status: AC
  Administered 2011-02-13: 30 mg
  Filled 2011-02-13: qty 1

## 2011-02-13 MED ORDER — GUAIFENESIN ER 1200 MG PO TB12: 1.0000 | ORAL_TABLET | Freq: Two times a day (BID) | ORAL | Status: DC

## 2011-02-13 MED ORDER — SODIUM CHLORIDE 0.9 % IV BOLUS (SEPSIS)
250.0000 mL | Freq: Once | INTRAVENOUS | Status: AC
  Administered 2011-02-13: 250 mL via INTRAVENOUS

## 2011-02-13 MED ORDER — SODIUM CHLORIDE 0.9 % IV SOLN
INTRAVENOUS | Status: DC
  Administered 2011-02-13: 21:00:00 via INTRAVENOUS

## 2011-02-13 MED ORDER — IPRATROPIUM BROMIDE 0.02 % IN SOLN
0.5000 mg | Freq: Once | RESPIRATORY_TRACT | Status: AC
  Administered 2011-02-13: 0.5 mg via RESPIRATORY_TRACT
  Filled 2011-02-13: qty 2.5

## 2011-02-13 MED ORDER — HYDROCODONE-ACETAMINOPHEN 5-325 MG PO TABS
1.0000 | ORAL_TABLET | Freq: Once | ORAL | Status: AC
  Administered 2011-02-13: 1 via ORAL
  Filled 2011-02-13: qty 1

## 2011-02-13 MED ORDER — ONDANSETRON HCL 4 MG/2ML IJ SOLN
4.0000 mg | Freq: Once | INTRAMUSCULAR | Status: AC
  Administered 2011-02-13: 4 mg via INTRAVENOUS
  Filled 2011-02-13: qty 2

## 2011-02-13 NOTE — ED Notes (Signed)
Patient with flu like symptoms for three days.  C/o body aches and chills.

## 2011-02-13 NOTE — ED Notes (Signed)
Pt requesting to know results of xrays, Lawyer, PA notified

## 2011-02-13 NOTE — ED Provider Notes (Signed)
Medical screening examination/treatment/procedure(s) were performed by non-physician practitioner and as supervising physician I was immediately available for consultation/collaboration.  Lindzy Rupert, MD 02/13/11 [id number] 

## 2011-02-13 NOTE — ED Provider Notes (Signed)
History     CSN: [id number]  Arrival date & time 02/13/11  1718   First MD Initiated Contact with Patient 02/13/11 1845      Chief Complaint  Patient presents with  . Chills  . Generalized Body Aches    (Consider location/radiation/quality/duration/timing/severity/associated sxs/prior treatment) HPI The patient presents the emergency department with flulike symptoms for the last 3 days.  He is complaining of bodyaches chills and cough.  States that he is not been able to eat or drink you last 3 days.  No nausea or vomiting, chest pain, weakness, abdominal pain, dysuria, headache, or visual changes.  He states that he feels like this may be pneumonia. Past Medical History  Diagnosis Date  . Hypertension   . Diabetes mellitus   . CHF (congestive heart failure)     Past Surgical History  Procedure Date  . Cardiac defibrillator placement     History reviewed. No pertinent family history.  History  Substance Use Topics  . Smoking status: Former Smoker    Types: Cigarettes, Cigars    Quit date: 07/27/2007  . Smokeless tobacco: Not on file  . Alcohol Use: No     no alcohol in 67month      Review of Systems All pertinent positives and negatives reviewed in the history of present illness  Allergies  Shellfish allergy  Home Medications   Current Outpatient Rx  Name Route Sig Dispense Refill  . AMITRIPTYLINE HCL 10 MG PO TABS Oral Take 10 mg by mouth at bedtime.    Marland Kitchen BENAZEPRIL HCL 40 MG PO TABS Oral Take 40 mg by mouth daily.    Marland Kitchen FLUOXETINE HCL 40 MG PO CAPS Oral Take 40 mg by mouth daily.    . FUROSEMIDE 80 MG PO TABS Oral Take 160 mg by mouth daily.    Marland Kitchen GABAPENTIN 300 MG PO CAPS Oral Take 300 mg by mouth 3 (three) times daily.    Marland Kitchen GLIPIZIDE 10 MG PO TABS Oral Take 10 mg by mouth daily.    . INSULIN GLARGINE 100 UNIT/ML [place] SOLN Subcutaneous Inject 15 Units into the skin at bedtime.    Marland Kitchen ROSUVASTATIN CALCIUM 10 MG PO TABS Oral Take 10 mg by mouth daily.    Marland Kitchen  SPIRONOLACTONE 25 MG PO TABS Oral Take 25 mg by mouth daily.      BP 124/75  Pulse 95  Temp(Src) 98.8 F (37.1 C) (Oral)  Resp 30  SpO2 96%  Physical Exam  Constitutional: He is oriented to person, place, and time. He appears well-developed and well-nourished. He appears distressed.  HENT:  Head: Normocephalic and atraumatic.  Right Ear: Tympanic membrane normal.  Left Ear: Tympanic membrane normal.  Mouth/Throat: Oropharynx is clear and moist. No oropharyngeal exudate.  Eyes: Pupils are equal, round, and reactive to light.  Neck: Normal range of motion.  Cardiovascular: Regular rhythm and normal heart sounds.  Exam reveals no gallop and no friction rub.   No murmur heard. Pulmonary/Chest: Effort normal. No respiratory distress. He has wheezes. He has no rales.  Abdominal: Soft. Bowel sounds are normal. He exhibits no distension. There is no tenderness. There is no rebound and no guarding.  Neurological: He is alert and oriented to person, place, and time.  Skin: Skin is warm and dry. No rash noted.    ED Course  Procedures (including critical care time)  Labs Reviewed  URINALYSIS, ROUTINE W REFLEX MICROSCOPIC - Abnormal; Notable for the following:    Color, Urine AMBER (*)  BIOCHEMICALS MAY BE AFFECTED BY COLOR   Hgb urine dipstick LARGE (*)    Bilirubin Urine MODERATE (*)    Ketones, ur 15 (*)    Protein, ur >300 (*)    Urobilinogen, UA 4.0 (*)    All other components within normal limits  GLUCOSE, CAPILLARY - Abnormal; Notable for the following:    Glucose-Capillary 145 (*)    All other components within normal limits  POCT I-STAT, CHEM 8 - Abnormal; Notable for the following:    Glucose, Bld 124 (*)    All other components within normal limits  GLUCOSE, CAPILLARY - Abnormal; Notable for the following:    Glucose-Capillary 114 (*)    All other components within normal limits  URINE MICROSCOPIC-ADD ON - Abnormal; Notable for the following:    Casts GRANULAR CAST (*)     All other components within normal limits  POCT I-STAT TROPONIN I  I-STAT, CHEM 8  POCT CBG MONITORING  I-STAT TROPONIN I   Dg Chest 2 View  02/13/2011  *RADIOLOGY REPORT*  Clinical Data: 46 year old male with cough and fever.  CHEST - 2 VIEW  Comparison: 05/13/2010 and earlier.  Findings: Chronic cardiomegaly not significantly changed.  Stable left chest cardiac AICD.  No pneumothorax, pleural effusion or acute pulmonary opacity.  Vascular congestion without overt edema is stable. No acute osseous abnormality identified.  IMPRESSION: Chronic cardiomegaly and vascular congestion.  No pulmonary edema or acute airspace disease.  Original Report Authenticated By: Harley Hallmark, M.D.       I reassessed the patient and he is feeling dramatically better following IV fluids oral fluids and food.  States has not had anything to eat or drink in the last 3 days due to feeling these flulike symptoms.  The patient states that he is breathing much better following the breathing treatment.  Patient has no signs of pneumonia on chest x-ray  MDM  MDM Reviewed: nursing note and vitals Interpretation: labs and x-ray      Out of the patient followup with his primary care Dr. for recheck.  Patient is advised to return here for any worsening in his condition.  He has a viral URI based on his history and physical exam findings.  He did appear to hydrate initially on exam.  Based on the fact that he had not eaten or had anything to drink in the last 3 days would further confirm that finding.  Also is mixed with his URI symptoms.Jamesetta Orleans Jamayah Myszka, PA-C 02/13/11 2330

## 2011-02-13 NOTE — ED Notes (Signed)
Scanner in room will not work; unable to scan medications or patient's bracelet.

## 2011-02-13 NOTE — ED Provider Notes (Signed)
Pt reports 3 day hx of cough, shortness of breath, chills, fever and bodyaches. States temp at home is 101. States has had 1 to 2 days of N/V. Reports 7 episodes in last 24 hours. Pt reports he has sharp CP when he coughs. States his mouth is very dry. repotrs blood sugars at home have been in the 100's. Pt takes Glipizide and HS Lantus.  Pt admits to hx of CHF, diabetes and high cholesterol. Pt appears very weak, BBS coarse bil. HEENT exam unremarkable. HR 118 w/ RRR.  Will initiate IVF's, obtain appropriate labs and CXR and move to acute side for further evaluation and continued observation.  Leanne Chang, NP 02/13/11 1929

## 2011-02-13 NOTE — ED Notes (Signed)
Patient given copy of discharge paperwork; went over discharge instructions with patient.  Instructed patient to take prescriptions as directed, to follow up with a primary care physician, to increase fluid intake, and to return to the ED for new, worsening, or concerning symptoms.

## 2011-02-14 NOTE — ED Provider Notes (Signed)
Pt seen by NP Schorr and her attending (pt not seen, discussed w me in stretcher triage area)  Suzi Roots, MD 02/14/11 1032

## 2011-03-15 ENCOUNTER — Encounter (HOSPITAL_COMMUNITY): Payer: Self-pay | Admitting: Emergency Medicine

## 2011-03-15 ENCOUNTER — Inpatient Hospital Stay (HOSPITAL_COMMUNITY)
Admission: EM | Admit: 2011-03-15 | Discharge: 2011-03-18 | DRG: 292 | Disposition: A | Discharge status: Home / Self Care | Payer: Medicare Other | Attending: Internal Medicine | Admitting: Internal Medicine

## 2011-03-15 ENCOUNTER — Other Ambulatory Visit: Payer: Self-pay

## 2011-03-15 ENCOUNTER — Emergency Department (HOSPITAL_COMMUNITY): Payer: Medicare Other

## 2011-03-15 DIAGNOSIS — Z95810 Presence of automatic (implantable) cardiac defibrillator: Secondary | ICD-10-CM

## 2011-03-15 DIAGNOSIS — I129 Hypertensive chronic kidney disease with stage 1 through stage 4 chronic kidney disease, or unspecified chronic kidney disease: Secondary | ICD-10-CM | POA: Diagnosis present

## 2011-03-15 DIAGNOSIS — Z23 Encounter for immunization: Secondary | ICD-10-CM

## 2011-03-15 DIAGNOSIS — F3289 Other specified depressive episodes: Secondary | ICD-10-CM | POA: Diagnosis present

## 2011-03-15 DIAGNOSIS — I5022 Chronic systolic (congestive) heart failure: Secondary | ICD-10-CM | POA: Diagnosis present

## 2011-03-15 DIAGNOSIS — N183 Chronic kidney disease, stage 3 unspecified: Secondary | ICD-10-CM | POA: Diagnosis present

## 2011-03-15 DIAGNOSIS — Z79899 Other long term (current) drug therapy: Secondary | ICD-10-CM

## 2011-03-15 DIAGNOSIS — I4729 Other ventricular tachycardia: Secondary | ICD-10-CM | POA: Diagnosis not present

## 2011-03-15 DIAGNOSIS — I472 Ventricular tachycardia, unspecified: Secondary | ICD-10-CM | POA: Diagnosis not present

## 2011-03-15 DIAGNOSIS — I10 Essential (primary) hypertension: Secondary | ICD-10-CM | POA: Diagnosis present

## 2011-03-15 DIAGNOSIS — I428 Other cardiomyopathies: Secondary | ICD-10-CM | POA: Diagnosis present

## 2011-03-15 DIAGNOSIS — N400 Enlarged prostate without lower urinary tract symptoms: Secondary | ICD-10-CM | POA: Diagnosis present

## 2011-03-15 DIAGNOSIS — F329 Major depressive disorder, single episode, unspecified: Secondary | ICD-10-CM | POA: Diagnosis present

## 2011-03-15 DIAGNOSIS — I509 Heart failure, unspecified: Principal | ICD-10-CM | POA: Diagnosis present

## 2011-03-15 DIAGNOSIS — R319 Hematuria, unspecified: Secondary | ICD-10-CM | POA: Diagnosis present

## 2011-03-15 DIAGNOSIS — N179 Acute kidney failure, unspecified: Secondary | ICD-10-CM | POA: Diagnosis present

## 2011-03-15 DIAGNOSIS — G4733 Obstructive sleep apnea (adult) (pediatric): Secondary | ICD-10-CM | POA: Diagnosis present

## 2011-03-15 DIAGNOSIS — N189 Chronic kidney disease, unspecified: Secondary | ICD-10-CM | POA: Diagnosis present

## 2011-03-15 DIAGNOSIS — Z794 Long term (current) use of insulin: Secondary | ICD-10-CM

## 2011-03-15 DIAGNOSIS — E1129 Type 2 diabetes mellitus with other diabetic kidney complication: Secondary | ICD-10-CM | POA: Diagnosis present

## 2011-03-15 HISTORY — DX: Personal history of peptic ulcer disease: Z87.11

## 2011-03-15 HISTORY — DX: Major depressive disorder, single episode, unspecified: F32.9

## 2011-03-15 HISTORY — DX: Obstructive sleep apnea (adult) (pediatric): G47.33

## 2011-03-15 HISTORY — DX: Alcohol abuse, in remission: F10.11

## 2011-03-15 HISTORY — DX: Depression, unspecified: F32.A

## 2011-03-15 LAB — CBC
HCT: 41.4 % (ref 39.0–52.0)
Hemoglobin: 13.3 g/dL (ref 13.0–17.0)
MCHC: 32.1 g/dL (ref 30.0–36.0)
MCV: 88.7 fL (ref 78.0–100.0)

## 2011-03-15 LAB — COMPREHENSIVE METABOLIC PANEL
ALT: 13 U/L (ref 0–53)
AST: 22 U/L (ref 0–37)
Albumin: 2.8 g/dL — ABNORMAL LOW (ref 3.5–5.2)
Alkaline Phosphatase: 76 U/L (ref 39–117)
BUN: 21 mg/dL (ref 6–23)
CO2: 25 mEq/L (ref 19–32)
Calcium: 9.3 mg/dL (ref 8.4–10.5)
Chloride: 104 mEq/L (ref 96–112)
Creatinine, Ser: 1.42 mg/dL — ABNORMAL HIGH (ref 0.50–1.35)
GFR calc Af Amer: 69 mL/min — ABNORMAL LOW (ref >90)
GFR calc non Af Amer: 59 mL/min — ABNORMAL LOW (ref >90)
Glucose, Bld: 119 mg/dL — ABNORMAL HIGH (ref 70–99)
Potassium: 4.8 mEq/L (ref 3.5–5.1)
Sodium: 136 mEq/L (ref 135–145)
Total Bilirubin: 1.1 mg/dL (ref 0.3–1.2)
Total Protein: 7.1 g/dL (ref 6.0–8.3)

## 2011-03-15 LAB — DIFFERENTIAL
Basophils Absolute: 0 10*3/uL (ref 0.0–0.1)
Basophils Relative: 1 % (ref 0–1)
Eosinophils Absolute: 0.2 10*3/uL (ref 0.0–0.7)
Eosinophils Relative: 3 % (ref 0–5)
Lymphocytes Relative: 32 % (ref 12–46)
Lymphs Abs: 1.7 10*3/uL (ref 0.7–4.0)
Monocytes Absolute: 0.3 10*3/uL (ref 0.1–1.0)
Monocytes Relative: 6 % (ref 3–12)
Neutro Abs: 3.1 10*3/uL (ref 1.7–7.7)
Neutrophils Relative %: 58 % (ref 43–77)

## 2011-03-15 LAB — POCT I-STAT TROPONIN I: Troponin i, poc: 0.05 ng/mL (ref 0.00–0.08)

## 2011-03-15 LAB — PRO B NATRIURETIC PEPTIDE: Pro B Natriuretic peptide (BNP): 4353 pg/mL — ABNORMAL HIGH (ref 0–125)

## 2011-03-15 MED ORDER — ASPIRIN 81 MG PO CHEW
81.0000 mg | CHEWABLE_TABLET | Freq: Every day | ORAL | Status: DC
  Administered 2011-03-15 – 2011-03-18 (×4): 81 mg via ORAL
  Filled 2011-03-15 (×4): qty 1

## 2011-03-15 MED ORDER — SPIRONOLACTONE 25 MG PO TABS
25.0000 mg | ORAL_TABLET | Freq: Every day | ORAL | Status: DC
  Administered 2011-03-16 – 2011-03-18 (×3): 25 mg via ORAL
  Filled 2011-03-15 (×3): qty 1

## 2011-03-15 MED ORDER — FUROSEMIDE 10 MG/ML IJ SOLN
80.0000 mg | Freq: Once | INTRAMUSCULAR | Status: AC
  Administered 2011-03-15: 80 mg via INTRAVENOUS
  Filled 2011-03-15: qty 8

## 2011-03-15 MED ORDER — ACETAMINOPHEN 650 MG RE SUPP: 650.0000 mg | Freq: Four times a day (QID) | RECTAL | Status: DC | PRN

## 2011-03-15 MED ORDER — GABAPENTIN 300 MG PO CAPS
300.0000 mg | ORAL_CAPSULE | Freq: Three times a day (TID) | ORAL | Status: DC
  Administered 2011-03-15 – 2011-03-18 (×8): 300 mg via ORAL
  Filled 2011-03-15 (×10): qty 1

## 2011-03-15 MED ORDER — PANTOPRAZOLE SODIUM 40 MG PO TBEC
40.0000 mg | DELAYED_RELEASE_TABLET | Freq: Every day | ORAL | Status: DC
  Administered 2011-03-15 – 2011-03-18 (×4): 40 mg via ORAL
  Filled 2011-03-15 (×4): qty 1

## 2011-03-15 MED ORDER — INFLUENZA VIRUS VACC SPLIT PF IM SUSP
0.5000 mL | Freq: Once | INTRAMUSCULAR | Status: DC
  Filled 2011-03-15: qty 0.5

## 2011-03-15 MED ORDER — ATORVASTATIN CALCIUM 10 MG PO TABS
10.0000 mg | ORAL_TABLET | Freq: Every day | ORAL | Status: DC
  Administered 2011-03-16 – 2011-03-17 (×2): 10 mg via ORAL
  Filled 2011-03-15 (×3): qty 1

## 2011-03-15 MED ORDER — DOCUSATE SODIUM 100 MG PO CAPS
100.0000 mg | ORAL_CAPSULE | Freq: Two times a day (BID) | ORAL | Status: DC
  Administered 2011-03-15 – 2011-03-18 (×6): 100 mg via ORAL
  Filled 2011-03-15 (×7): qty 1

## 2011-03-15 MED ORDER — FLUOXETINE HCL 20 MG PO CAPS
40.0000 mg | ORAL_CAPSULE | Freq: Every day | ORAL | Status: DC
  Administered 2011-03-16 – 2011-03-18 (×3): 40 mg via ORAL
  Filled 2011-03-15 (×3): qty 2

## 2011-03-15 MED ORDER — NITROGLYCERIN 2 % TD OINT
1.0000 [in_us] | TOPICAL_OINTMENT | Freq: Once | TRANSDERMAL | Status: AC
  Administered 2011-03-15: 1 [in_us] via TOPICAL
  Filled 2011-03-15: qty 1

## 2011-03-15 MED ORDER — ENOXAPARIN SODIUM 40 MG/0.4ML ~~LOC~~ SOLN
40.0000 mg | SUBCUTANEOUS | Status: DC
  Administered 2011-03-15 – 2011-03-17 (×3): 40 mg via SUBCUTANEOUS
  Filled 2011-03-15 (×4): qty 0.4

## 2011-03-15 MED ORDER — ACETAMINOPHEN 325 MG PO TABS
650.0000 mg | ORAL_TABLET | Freq: Four times a day (QID) | ORAL | Status: DC | PRN
  Administered 2011-03-15: 650 mg via ORAL
  Filled 2011-03-15 (×2): qty 2

## 2011-03-15 MED ORDER — INSULIN GLARGINE 100 UNIT/ML ~~LOC~~ SOLN
15.0000 [IU] | Freq: Every day | SUBCUTANEOUS | Status: DC
  Administered 2011-03-15 – 2011-03-17 (×3): 15 [IU] via SUBCUTANEOUS
  Filled 2011-03-15: qty 3

## 2011-03-15 MED ORDER — INSULIN ASPART 100 UNIT/ML ~~LOC~~ SOLN
0.0000 [IU] | Freq: Three times a day (TID) | SUBCUTANEOUS | Status: DC
  Administered 2011-03-15: 5 [IU] via SUBCUTANEOUS
  Administered 2011-03-16 (×2): 3 [IU] via SUBCUTANEOUS
  Administered 2011-03-17: 2 [IU] via SUBCUTANEOUS
  Administered 2011-03-17: 3 [IU] via SUBCUTANEOUS
  Administered 2011-03-17 – 2011-03-18 (×4): 2 [IU] via SUBCUTANEOUS
  Filled 2011-03-15: qty 3

## 2011-03-15 MED ORDER — AMITRIPTYLINE HCL 10 MG PO TABS
10.0000 mg | ORAL_TABLET | Freq: Every day | ORAL | Status: DC
  Administered 2011-03-15 – 2011-03-17 (×3): 10 mg via ORAL
  Filled 2011-03-15 (×4): qty 1

## 2011-03-15 MED ORDER — PNEUMOCOCCAL VAC POLYVALENT 25 MCG/0.5ML IJ INJ
0.5000 mL | INJECTION | INTRAMUSCULAR | Status: AC
  Administered 2011-03-16: 0.5 mL via INTRAMUSCULAR
  Filled 2011-03-15: qty 0.5

## 2011-03-15 MED ORDER — SODIUM CHLORIDE 0.9 % IJ SOLN
3.0000 mL | Freq: Two times a day (BID) | INTRAMUSCULAR | Status: DC
  Administered 2011-03-15 – 2011-03-17 (×5): 3 mL via INTRAVENOUS

## 2011-03-15 MED ORDER — FUROSEMIDE 10 MG/ML IJ SOLN
80.0000 mg | Freq: Two times a day (BID) | INTRAMUSCULAR | Status: DC
  Filled 2011-03-15 (×3): qty 8

## 2011-03-15 MED ORDER — METOPROLOL TARTRATE 12.5 MG HALF TABLET
12.5000 mg | ORAL_TABLET | Freq: Two times a day (BID) | ORAL | Status: DC
  Administered 2011-03-15 – 2011-03-17 (×5): 12.5 mg via ORAL
  Filled 2011-03-15 (×7): qty 1

## 2011-03-15 MED ORDER — BENAZEPRIL HCL 40 MG PO TABS
40.0000 mg | ORAL_TABLET | Freq: Every day | ORAL | Status: DC
  Administered 2011-03-16 – 2011-03-18 (×3): 40 mg via ORAL
  Filled 2011-03-15 (×3): qty 1

## 2011-03-15 MED ORDER — FLUOXETINE HCL 40 MG PO CAPS: 40.0000 mg | ORAL_CAPSULE | Freq: Every day | ORAL | Status: DC

## 2011-03-15 NOTE — ED Notes (Signed)
Pt c/o increased fluid in legs and SOB; pt sts pain in chest; pt sts hx of similar

## 2011-03-15 NOTE — ED Provider Notes (Signed)
History     CSN: [id number]  Arrival date & time 03/15/11  1040   First MD Initiated Contact with Patient 03/15/11 1217      Chief Complaint  Patient presents with  . Leg Swelling  . Shortness of Breath    (Consider location/radiation/quality/duration/timing/severity/associated sxs/prior treatment) HPI  Pt relates he started getting SOB, DOE, abdominal swelling, swelling of his legs for the past 3-4 weeks. States he was seen in the ED a few weeks ago and diagnosed with bronchitis. States when he exerts himself he gets a central chest pressure. He also has a dry cough without fever. He denies nausea, vomiting, diarrhea. She relates exertion makes his breathing and chest pain worse.  PC[facility]   Past Medical History  Diagnosis Date  . Hypertension   . Diabetes mellitus   . CHF (congestive heart failure)     Past Surgical History  Procedure Date  . Cardiac defibrillator placement     History reviewed. No pertinent family history.  History  Substance Use Topics  . Smoking status: Former Smoker    Types: Cigarettes, Cigars    Quit date: 07/27/2007  . Smokeless tobacco: Not on file  . Alcohol Use: No     no alcohol in 47month   on disability    Review of Systems  All other systems reviewed and are negative.    Allergies  Shellfish allergy  Home Medications   Current Outpatient Rx  Name Route Sig Dispense Refill  . AMITRIPTYLINE HCL 10 MG PO TABS Oral Take 10 mg by mouth at bedtime.    Marland Kitchen BENAZEPRIL HCL 40 MG PO TABS Oral Take 40 mg by mouth daily.    Marland Kitchen FLUOXETINE HCL 40 MG PO CAPS Oral Take 40 mg by mouth daily.    . FUROSEMIDE 80 MG PO TABS Oral Take 240 mg by mouth daily.     Marland Kitchen GABAPENTIN 300 MG PO CAPS Oral Take 300 mg by mouth 3 (three) times daily.    Marland Kitchen GLIPIZIDE 10 MG PO TABS Oral Take 10 mg by mouth daily with breakfast.     . INSULIN GLARGINE 100 UNIT/ML [place] SOLN Subcutaneous Inject 15 Units into the skin at bedtime.    Marland Kitchen  ROSUVASTATIN CALCIUM 10 MG PO TABS Oral Take 10 mg by mouth daily.    Marland Kitchen SPIRONOLACTONE 25 MG PO TABS Oral Take 25 mg by mouth daily.      BP 134/96  Pulse 93  Temp(Src) 97.8 F (36.6 C) (Oral)  Resp 16  SpO2 98%  Vital signs normal    Physical Exam  Nursing note and vitals reviewed. Constitutional: He is oriented to person, place, and time. He appears well-developed and well-nourished.  Non-toxic appearance. He does not appear ill. No distress.       Patient was and related to his room, he is noted to have dyspnea when he ambulates.  HENT:  Head: Normocephalic and atraumatic.  Right Ear: External ear normal.  Left Ear: External ear normal.  Nose: Nose normal. No mucosal edema or rhinorrhea.  Mouth/Throat: Oropharynx is clear and moist and mucous membranes are normal. No dental abscesses or uvula swelling.  Eyes: Conjunctivae and EOM are normal. Pupils are equal, round, and reactive to light.  Neck: Normal range of motion and full passive range of motion without pain. Neck supple.  Cardiovascular: Normal rate, regular rhythm and normal heart sounds.  Exam reveals no gallop and no friction rub.   No murmur  heard. Pulmonary/Chest: Effort normal. No respiratory distress. He has no wheezes. He has no rhonchi. He has no rales. He exhibits no tenderness and no crepitus.       Patient has diminished breath sounds diffusely  Abdominal: Soft. Normal appearance and bowel sounds are normal. He exhibits distension. There is no tenderness. There is no rebound and no guarding.       Patient's abdomen is tight and distended  Musculoskeletal: Normal range of motion. He exhibits no edema and no tenderness.       Patient has pitting edema above his knees laterally  Neurological: He is alert and oriented to person, place, and time. He has normal strength. No cranial nerve deficit.  Skin: Skin is warm, dry and intact. No rash noted. No erythema. No pallor.  Psychiatric: He has a normal mood and  affect. His speech is normal and behavior is normal. His mood appears not anxious.    ED Course  Procedures (including critical care time)  Medications  furosemide (LASIX) injection 80 mg (80 mg Intravenous Given 03/15/11 1405)  nitroGLYCERIN (NITROGLYN) 2 % ointment 1 inch (1 inch Topical Given 03/15/11 1406)   14:43 Dr Denton Meek, [facility] will admit    Results for orders placed during the hospital encounter of 03/15/11  CBC      Component Value Range   WBC 5.3  4.0 - 10.5 (K/uL)   RBC 4.67  4.22 - 5.81 (MIL/uL)   Hemoglobin 13.3  13.0 - 17.0 (g/dL)   HCT 16.1  09.6 - 04.5 (%)   MCV 88.7  78.0 - 100.0 (fL)   MCH 28.5  26.0 - 34.0 (pg)   MCHC 32.1  30.0 - 36.0 (g/dL)   RDW 40.9  81.1 - 91.4 (%)   Platelets 285  150 - 400 (K/uL)  DIFFERENTIAL      Component Value Range   Neutrophils Relative 58  43 - 77 (%)   Neutro Abs 3.1  1.7 - 7.7 (K/uL)   Lymphocytes Relative 32  12 - 46 (%)   Lymphs Abs 1.7  0.7 - 4.0 (K/uL)   Monocytes Relative 6  3 - 12 (%)   Monocytes Absolute 0.3  0.1 - 1.0 (K/uL)   Eosinophils Relative 3  0 - 5 (%)   Eosinophils Absolute 0.2  0.0 - 0.7 (K/uL)   Basophils Relative 1  0 - 1 (%)   Basophils Absolute 0.0  0.0 - 0.1 (K/uL)  COMPREHENSIVE METABOLIC PANEL      Component Value Range   Sodium 136  135 - 145 (mEq/L)   Potassium 4.8  3.5 - 5.1 (mEq/L)   Chloride 104  96 - 112 (mEq/L)   CO2 25  19 - 32 (mEq/L)   Glucose, Bld 119 (*) 70 - 99 (mg/dL)   BUN 21  6 - 23 (mg/dL)   Creatinine, Ser 7.82 (*) 0.50 - 1.35 (mg/dL)   Calcium 9.3  8.4 - 95.6 (mg/dL)   Total Protein 7.1  6.0 - 8.3 (g/dL)   Albumin 2.8 (*) 3.5 - 5.2 (g/dL)   AST 22  0 - 37 (U/L)   ALT 13  0 - 53 (U/L)   Alkaline Phosphatase 76  39 - 117 (U/L)   Total Bilirubin 1.1  0.3 - 1.2 (mg/dL)   GFR calc non Af Amer 59 (*) >90 (mL/min)   GFR calc Af Amer 69 (*) >90 (mL/min)  PRO B NATRIURETIC PEPTIDE      Component Value Range   Pro  B Natriuretic peptide (BNP) 4353.0 (*) 0 - 125 (pg/mL)  POCT  I-STAT TROPONIN I      Component Value Range   Troponin i, poc 0.05  0.00 - 0.08 (ng/mL)   Comment 3            Laboratory interpretation all normal except for elevated BNP (prior high a year ago was 500)    Dg Chest 2 View  02/13/2011  *RADIOLOGY REPORT*  Clinical Data: 46 year old male with cough and fever.  CHEST - 2 VIEW  Comparison: 05/13/2010 and earlier.  Findings: Chronic cardiomegaly not significantly changed.  Stable left chest cardiac AICD.  No pneumothorax, pleural effusion or acute pulmonary opacity.  Vascular congestion without overt edema is stable. No acute osseous abnormality identified.  IMPRESSION: Chronic cardiomegaly and vascular congestion.  No pulmonary edema or acute airspace disease.  Original Report Authenticated By: Harley Hallmark, M.D.    Date: 03/15/2011  Rate: 96  Rhythm: normal sinus rhythm  QRS Axis: normal  Intervals: QT prolonged  ST/T Wave abnormalities: nonspecific T wave changes  Conduction Disutrbances:none  Narrative Interpretation:   Old EKG Reviewed: unchanged from 02/13/2011    Diagnoses that have been ruled out:  None  Diagnoses that are still under consideration:  None  Final diagnoses:  Congestive heart failure (CHF)   Plan admission  Devoria Albe, MD, FACEP   MDM          Ward Givens, MD 03/15/11 [phone]

## 2011-03-15 NOTE — ED Notes (Signed)
EKG done in triage and signed off by stretcher doctor.

## 2011-03-15 NOTE — H&P (Signed)
Hospital Admission Note Date: 03/15/2011  Patient name: [name] Medical record number: [id number] Date of birth: 12/30/1965 Age: 46 y.o. 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  The patient notes a 3-week history of shortness of breath, previously being able to walk 2 blocks before becoming SOB, now having SOB when walking across the kitchen.  He normally sleeps in a semi-reclined position, but for the last 3 weeks has had to sleep sitting up in a chair.  He describes PND 3-4 x/night during this time period.  He notes significant bilateral leg swelling, tightness, and pain, and decreased volume of dark slightly red-tinged urine during this time.  The patient also notes mild non-productive cough.  No chest pain, headache, LH, fevers, nausea/vomiting, diarrhea/constipation, dysuria.  The patient has been quadrupling his lasix dose at home to try to improve his symptoms.  The patient presented with symptoms of a non-productive cough 3 weeks ago, was given IV fluids and a prescription for mucinex, and was discharged with a diagnosis of viral URI.  Meds: Medications Prior to Admission  Medication Dose Route Frequency Provider Last Rate Last Dose  . furosemide (LASIX) injection 80 mg  80 mg Intravenous Once Ward Givens, MD   80 mg at 03/15/11 1405  . nitroGLYCERIN (NITROGLYN) 2 % ointment 1 inch  1 inch Topical Once Ward Givens, MD   1 inch at 03/15/11  1406   Medications Prior to Admission  Medication Sig Dispense Refill  . amitriptyline (ELAVIL) 10 MG tablet Take 10 mg by mouth at bedtime.      . benazepril (LOTENSIN) 40 MG tablet Take 40 mg by mouth daily.      Marland Kitchen FLUoxetine (PROZAC) 40 MG capsule Take 40 mg by mouth daily.      . furosemide (LASIX) 80 MG tablet Take 240 mg by mouth daily.   prescribed 80 daily    . gabapentin (NEURONTIN) 300 MG capsule Take 300 mg by mouth 3 (three) times daily.      Marland Kitchen glipiZIDE (GLUCOTROL) 10 MG tablet Take 10 mg by mouth daily with breakfast.       . insulin glargine (LANTUS) 100 UNIT/ML injection Inject 15 Units into the skin at bedtime.      . rosuvastatin (CRESTOR) 10 MG tablet Take 10 mg by mouth daily.      Marland Kitchen spironolactone (ALDACTONE) 25 MG tablet Take 25 mg by mouth daily.        Allergies: Shellfish allergy - throat swelling  Past Medical History  Diagnosis Date  . Hypertension   . Diabetes mellitus   . 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 History of peptic ulcer disease     EGD 09/2007 shows prox gastric ulcer with black eschar, treated with PPI  . Depression   .  History of alcohol abuse     quit approx 2011-2012  . Angina   . Shortness of breath   . OSA (obstructive sleep apnea)     sleeps with oxygen   Past Surgical History  Procedure Date  . Cardiac defibrillator placement 04/2007  . Knee surgery    History reviewed. No pertinent family history.  History   Social History  . Marital Status: Married    Spouse Name: N/A    Number of Children: N/A  . Years of Education: N/A   Social History Main Topics  . Smoking status: Former Smoker    Types: Cigarettes, Cigars    Quit date: 07/27/2007  . Smokeless tobacco: Not on file  . Alcohol Use: No     no alcohol since 2012  . Drug Use: No  . Sexually Active: Not on file   Social History Narrative   Lives with his wife and  her grandfather, on disability for CHF.    Review of Systems: General: no fevers, chills, changes in appetite Skin: no rash HEENT: no blurry vision, hearing changes, sore throat Pulm: see HPI CV: no chest pain, palpitations Abd: no abdominal pain, nausea/vomiting, diarrhea/constipation GU: see HPI Neuro: no weakness, numbness, or tingling   Physical Exam: Blood pressure 135/97, pulse 100, temperature 97.8 F (36.6 C), temperature source Oral, resp. rate 25, SpO2 100.00%. General: alert, cooperative, and in no apparent distress HEENT: pupils equal round and reactive to light, vision grossly intact, oropharynx clear and non-erythematous  Neck: supple, no lymphadenopathy, +JVD Lungs: diffusely diminished breath sounds, mildly increased work of respiration, no wheezes, rales, ronchi Heart: tachycardic, regular rhythm, no m/g/r Abdomen: round, non-tender, non-distended, normal bowel sounds Extremities: 2+ pitting edema to the level of the thigh Neurologic: alert & oriented X3, cranial nerves II-XII intact, strength grossly intact, sensation intact to light touch   Lab results: Basic Metabolic Panel:  Basename 03/15/11 1242  NA 136  K 4.8  CL 104  CO2 25  GLUCOSE 119*  BUN 21  CREATININE 1.42*  CALCIUM 9.3  MG --  PHOS --   Liver Function Tests:  Basename 03/15/11 1242  AST 22  ALT 13  ALKPHOS 76  BILITOT 1.1  PROT 7.1  ALBUMIN 2.8*   CBC:  Basename 03/15/11 1242  WBC 5.3  NEUTROABS 3.1  HGB 13.3  HCT 41.4  MCV 88.7  PLT 285   BNP:  Basename 03/15/11 1242  PROBNP 4353.0*  previously pro-BNP = 445 (05/13/10)  Hemoglobin A1C: 7.1  Imaging results:  Dg Chest Port 1 View  03/15/2011  *RADIOLOGY REPORT*  Clinical Data: Shortness of breath, chest pain  PORTABLE CHEST - 1 VIEW  Comparison: 02/13/2011  Findings: Lungs are essentially clear.  No frank interstitial edema.  No pleural effusion or pneumothorax.  Stable cardiomegaly.  Left subclavian ICD.   IMPRESSION: No evidence of acute cardiopulmonary disease.  Stable cardiomegaly.  Original Report Authenticated By: Charline Bills, M.D.   Echo 08/15/09 Study Conclusions - Left ventricle: The cavity size was severely dilated. Wall thickness was normal. Systolic function was severely reduced. The estimated ejection fraction was in the range of 20% to 25%. Diffuse hypokinesis. - Mitral valve: Mild regurgitation. - Left atrium: The atrium was mildly dilated. - Atrial septum: No defect or patent foramen ovale was identified. - Pulmonary arteries: PA peak pressure: 41mm Hg (S).  Other results: EKG: p waves enlarged suggesting atrial dilation, NSR, diffuse flattening of T waves, no ST segment abnormalities  Assessment &  Plan by Problem: The patient is a 46 yo man, history of CHF (EF 20-25%) s/p ICD placement, DM (Hb A1C = 7.1), Hypertension, presenting with shortness of breath, orthopnea, and PND, consistent with CHF exacerbation.  # Acute on chronic CHF - History of non-ischemic CM, EF 20-25%, likely secondary to prior heavy alcohol usage.  Patient presents with orthopnea, PND, and increased DOE, with elevated pro-BNP, consistent with acute CHF exacerbation.  Troponin negative x1, EKG unremarkable.  S/p AICD placement 04/2007.  Trigger for current exacerbation may be iatrogenic volume overload.  No evidence for myocardial ischemia vs arrythmia vs uncontrolled HTN vs AKI. -IV lasix 80 BID -ordered Echo -continue benazepril, spironolactone -patient not on home BB, adding metoprolol -aspirin -flu vaccine, pneumovax  # Hypertension - chronic -continue spironolactone, benzepril -IV lasix  # DM - Hb A1C = 7.1 -continue lantus 15 qhs -SSI -hold glipizide while inpatient  # CRI - Baseline Cr = 1.3-1.4, likely due to poorly controlled HTN.  Cr currently at baseline. -follow BMETs with diuresis -UA to exclude hematuria, given report of dark urine  # History of PUD - reports mild abdominal  pain -protonix  # OSA - chronic, stable -CPAP  # Depression -continue prozac, amitriptyline  # Prophy - lovenox  SignedJanalyn Harder 03/15/2011, 4:40 PM

## 2011-03-16 ENCOUNTER — Encounter (HOSPITAL_COMMUNITY): Payer: Self-pay | Admitting: Internal Medicine

## 2011-03-16 DIAGNOSIS — I379 Nonrheumatic pulmonary valve disorder, unspecified: Secondary | ICD-10-CM

## 2011-03-16 LAB — URINE MICROSCOPIC-ADD ON

## 2011-03-16 LAB — GLUCOSE, CAPILLARY
Glucose-Capillary: 174 mg/dL — ABNORMAL HIGH (ref 70–99)
Glucose-Capillary: 160 mg/dL — ABNORMAL HIGH (ref 70–99)

## 2011-03-16 LAB — URINALYSIS, ROUTINE W REFLEX MICROSCOPIC
Nitrite: NEGATIVE
Specific Gravity, Urine: 1.02 (ref 1.005–1.030)
Urobilinogen, UA: 2 mg/dL — ABNORMAL HIGH (ref 0.0–1.0)
pH: 5.5 (ref 5.0–8.0)

## 2011-03-16 LAB — CBC
HCT: 42.3 % (ref 39.0–52.0)
Hemoglobin: 14.1 g/dL (ref 13.0–17.0)
MCH: 29.5 pg (ref 26.0–34.0)
MCHC: 33.3 g/dL (ref 30.0–36.0)
RBC: 4.78 MIL/uL (ref 4.22–5.81)

## 2011-03-16 LAB — COMPREHENSIVE METABOLIC PANEL
ALT: 13 U/L (ref 0–53)
AST: 24 U/L (ref 0–37)
Albumin: 2.5 g/dL — ABNORMAL LOW (ref 3.5–5.2)
Calcium: 9 mg/dL (ref 8.4–10.5)
Creatinine, Ser: 1.39 mg/dL — ABNORMAL HIGH (ref 0.50–1.35)
Sodium: 136 mEq/L (ref 135–145)
Total Protein: 6.9 g/dL (ref 6.0–8.3)

## 2011-03-16 MED ORDER — TRAMADOL HCL 50 MG PO TABS
100.0000 mg | ORAL_TABLET | Freq: Four times a day (QID) | ORAL | Status: DC | PRN
  Administered 2011-03-16 – 2011-03-17 (×4): 100 mg via ORAL
  Filled 2011-03-16 (×4): qty 2

## 2011-03-16 MED ORDER — ZOLPIDEM TARTRATE 5 MG PO TABS
5.0000 mg | ORAL_TABLET | Freq: Every evening | ORAL | Status: DC | PRN
  Administered 2011-03-16 – 2011-03-17 (×2): 5 mg via ORAL
  Filled 2011-03-16 (×2): qty 1

## 2011-03-16 MED ORDER — ZOLPIDEM TARTRATE 5 MG PO TABS
5.0000 mg | ORAL_TABLET | Freq: Once | ORAL | Status: AC
  Administered 2011-03-16: 5 mg via ORAL
  Filled 2011-03-16: qty 1

## 2011-03-16 MED ORDER — FUROSEMIDE 10 MG/ML IJ SOLN
80.0000 mg | Freq: Three times a day (TID) | INTRAMUSCULAR | Status: DC
  Administered 2011-03-16: 80 mg via INTRAVENOUS
  Filled 2011-03-16 (×3): qty 8

## 2011-03-16 MED ORDER — TERAZOSIN HCL 1 MG PO CAPS
1.0000 mg | ORAL_CAPSULE | Freq: Every day | ORAL | Status: DC
  Administered 2011-03-16 – 2011-03-17 (×2): 1 mg via ORAL
  Filled 2011-03-16 (×3): qty 1

## 2011-03-16 MED ORDER — FUROSEMIDE 10 MG/ML IJ SOLN
80.0000 mg | Freq: Three times a day (TID) | INTRAMUSCULAR | Status: DC
  Administered 2011-03-16 – 2011-03-17 (×3): 80 mg via INTRAVENOUS
  Filled 2011-03-16 (×6): qty 8

## 2011-03-16 NOTE — H&P (Addendum)
Internal Medicine Teaching Service Attending Note Date: 03/16/2011  Patient name: [name]  Medical record number: [id number]  Date of birth: 1965-04-24   I have seen and evaluated Halford Decamp and discussed their care with the Residency Team.  Mr. 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 He reports 3 wk history of DPE after being diagnosed with RUI. HE also subscribes to worsening orthopnea, lower extremity bilateral swelling. He attempted to increase diuretics but did not feel that his symptoms improved. He is being admitted for CHF exacerbation.  Physical Exam: Blood pressure 122/91, pulse 91, temperature 97.7 F (36.5 C), temperature source Oral, resp. rate 20, height 6' (1.829 m), weight 242 lb 3.2 oz (109.861 kg), SpO2 99.00%.  Constitutional: He is oriented to person, place, and time. He appears well-developed and well-nourished. No distress.  HENT: [place]/AT, PERRLA, EOMI, no scleral icterus Mouth/Throat: Oropharynx is clear and moist. No oropharyngeal exudate.  Cardiovascular: Normal rate, regular rhythm and normal heart sounds. Exam reveals no gallop and no friction rub.  No murmur heard.  Pulmonary/Chest: Effort normal and breath sounds normal. No respiratory distress. He has no wheezes.  Abdominal: Soft. Bowel sounds are normal. He exhibits no distension. There is no tenderness.  Lymphadenopathy:  He has no cervical adenopathy.  Neurological: He is alert and oriented to person, place, and time.  Skin: Skin is warm and dry. No rash noted. No erythema.  Edema + 2 up to thigh Psychiatric: He has a normal mood and affect. His behavior is normal.    Lab results: Results for orders placed during the hospital encounter of 03/15/11 (from the past 24 hour(s))  GLUCOSE, CAPILLARY     Status: Abnormal   Collection Time   03/15/11  9:26 PM      Component Value Range   Glucose-Capillary 232 (*) 70 - 99 (mg/dL)    GLUCOSE, CAPILLARY     Status: Abnormal   Collection Time   03/16/11  5:54 AM      Component Value Range   Glucose-Capillary 105 (*) 70 - 99 (mg/dL)  COMPREHENSIVE METABOLIC PANEL     Status: Abnormal   Collection Time   03/16/11  6:45 AM      Component Value Range   Sodium 136  135 - 145 (mEq/L)   Potassium 4.5  3.5 - 5.1 (mEq/L)   Chloride 105  96 - 112 (mEq/L)   CO2 19  19 - 32 (mEq/L)   Glucose, Bld 111 (*) 70 - 99 (mg/dL)   BUN 27 (*) 6 - 23 (mg/dL)   Creatinine, Ser 4.09 (*) 0.50 - 1.35 (mg/dL)   Calcium 9.0  8.4 - 81.1 (mg/dL)   Total Protein 6.9  6.0 - 8.3 (g/dL)   Albumin 2.5 (*) 3.5 - 5.2 (g/dL)   AST 24  0 - 37 (U/L)   ALT 13  0 - 53 (U/L)   Alkaline Phosphatase 72  39 - 117 (U/L)   Total Bilirubin 0.9  0.3 - 1.2 (mg/dL)   GFR calc non Af Amer 61 (*) >90 (mL/min)   GFR calc Af Amer 70 (*) >90 (mL/min)  CBC     Status: Normal   Collection Time   03/16/11  6:45 AM      Component Value Range   WBC 4.9  4.0 - 10.5 (K/uL)   RBC 4.78  4.22 - 5.81 (MIL/uL)   Hemoglobin 14.1  13.0 -  17.0 (g/dL)   HCT 16.1  09.6 - 04.5 (%)   MCV 88.5  78.0 - 100.0 (fL)   MCH 29.5  26.0 - 34.0 (pg)   MCHC 33.3  30.0 - 36.0 (g/dL)   RDW 40.9  81.1 - 91.4 (%)   Platelets 255  150 - 400 (K/uL)  URINALYSIS, ROUTINE W REFLEX MICROSCOPIC     Status: Abnormal   Collection Time   03/16/11 11:30 AM      Component Value Range   Color, Urine AMBER (*) YELLOW    APPearance CLEAR  CLEAR    Specific Gravity, Urine 1.020  1.005 - 1.030    pH 5.5  5.0 - 8.0    Glucose, UA NEGATIVE  NEGATIVE (mg/dL)   Hgb urine dipstick SMALL (*) NEGATIVE    Bilirubin Urine SMALL (*) NEGATIVE    Ketones, ur NEGATIVE  NEGATIVE (mg/dL)   Protein, ur >782 (*) NEGATIVE (mg/dL)   Urobilinogen, UA 2.0 (*) 0.0 - 1.0 (mg/dL)   Nitrite NEGATIVE  NEGATIVE    Leukocytes, UA NEGATIVE  NEGATIVE   URINE MICROSCOPIC-ADD ON     Status: Abnormal   Collection Time   03/16/11 11:30 AM      Component Value Range   Squamous  Epithelial / LPF RARE  RARE    WBC, UA 0-2  <3 (WBC/hpf)   RBC / HPF 3-6  <3 (RBC/hpf)   Bacteria, UA RARE  RARE    Casts HYALINE CASTS (*) NEGATIVE   GLUCOSE, CAPILLARY     Status: Abnormal   Collection Time   03/16/11 11:36 AM      Component Value Range   Glucose-Capillary 174 (*) 70 - 99 (mg/dL)    Imaging results:  Dg Chest Port 1 View  03/15/2011  *RADIOLOGY REPORT*  Clinical Data: Shortness of breath, chest pain  PORTABLE CHEST - 1 VIEW  Comparison: 02/13/2011  Findings: Lungs are essentially clear.  No frank interstitial edema.  No pleural effusion or pneumothorax.  Stable cardiomegaly.  Left subclavian ICD.  IMPRESSION: No evidence of acute cardiopulmonary disease.  Stable cardiomegaly.  Original Report Authenticated By: Charline Bills, M.D.    Assessment and Plan: I agree with the formulated Assessment and Plan as outlined by Dr. Manson Passey.

## 2011-03-16 NOTE — Progress Notes (Signed)
Placed pt on nasal CPAP on auto titrate mode. No complications noted. No O2 bled in.

## 2011-03-16 NOTE — Plan of Care (Signed)
Problem: Phase I Progression Outcomes Goal: Voiding-avoid urinary catheter unless indicated Outcome: Completed/Met Date Met:  03/16/11 Patient uses urinal

## 2011-03-16 NOTE — Progress Notes (Signed)
*  PRELIMINARY RESULTS* Echocardiogram 2D Echocardiogram has been performed.  Clide Deutscher 03/16/2011, 9:47 AM

## 2011-03-16 NOTE — Progress Notes (Signed)
Dr. Drue Second made aware on rounds of 6 beats of vtach, no orders given. Pt. Asymptomatic.

## 2011-03-16 NOTE — Progress Notes (Addendum)
Subjective: No acute events overnight.  Patient slept well with sleeping aid.  Patient notes somewhat increased UOP, and somewhat improved SOB.  Objective: Vital signs in last 24 hours: Filed Vitals:   03/15/11 1841 03/15/11 1935 03/15/11 2345 03/16/11 0506  BP: 140/92 142/90 124/83 133/96  Pulse: 99 103 105 89  Temp:  98.5 F (36.9 C) 98.6 F (37 C) 97.6 F (36.4 C)  TempSrc:  Oral Oral Oral  Resp: 23 22 20 20   Height:  6' (1.829 m)    Weight:  246 lb 0.5 oz (111.6 kg)  242 lb 3.2 oz (109.861 kg)  SpO2: 99% 98% 99% 98%   Weight change:   Intake/Output Summary (Last 24 hours) at 03/16/11 1056 Last data filed at 03/16/11 0900  Gross per 24 hour  Intake    603 ml  Output   1650 ml  Net  -1047 ml   Physical Exam: General: alert, cooperative, and in no apparent distress HEENT: pupils equal round and reactive to light, vision grossly intact, oropharynx clear and non-erythematous  Neck: supple, no lymphadenopathy, +JVD Lungs: diffusely diminished breath sounds, mildly increased work of respiration, no wheezes, rales, ronchi Heart: tachycardic, regular rhythm, no m/g/r Abdomen: round, non-tender, non-distended, normal bowel sounds  Extremities: 2+ pitting edema to the level of the thigh Neurologic: alert & oriented X3, cranial nerves II-XII intact, strength grossly intact, sensation intact to light touch  Lab Results: Basic Metabolic Panel:  Lab 03/16/11 4098 03/15/11 1242  NA 136 136  K 4.5 4.8  CL 105 104  CO2 19 25  GLUCOSE 111* 119*  BUN 27* 21  CREATININE 1.39* 1.42*  CALCIUM 9.0 9.3  MG -- --  PHOS -- --   Liver Function Tests:  Lab 03/16/11 0645 03/15/11 1242  AST 24 22  ALT 13 13  ALKPHOS 72 76  BILITOT 0.9 1.1  PROT 6.9 7.1  ALBUMIN 2.5* 2.8*   CBC:  Lab 03/16/11 0645 03/15/11 1242  WBC 4.9 5.3  NEUTROABS -- 3.1  HGB 14.1 13.3  HCT 42.3 41.4  MCV 88.5 88.7  PLT 255 285   BNP:  Lab 03/15/11 1242  PROBNP 4353.0*   CBG:  Lab 03/16/11 0554  03/15/11 2126  GLUCAP 105* 232*   Studies/Results: Dg Chest Port 1 View  03/15/2011  *RADIOLOGY REPORT*  Clinical Data: Shortness of breath, chest pain  PORTABLE CHEST - 1 VIEW  Comparison: 02/13/2011  Findings: Lungs are essentially clear.  No frank interstitial edema.  No pleural effusion or pneumothorax.  Stable cardiomegaly.  Left subclavian ICD.  IMPRESSION: No evidence of acute cardiopulmonary disease.  Stable cardiomegaly.  Original Report Authenticated By: Charline Bills, M.D.   Medications: I have reviewed the patient's current medications. Scheduled Meds:   . amitriptyline  10 mg Oral QHS  . aspirin  81 mg Oral Daily  . atorvastatin  10 mg Oral q1800  . benazepril  40 mg Oral Daily  . docusate sodium  100 mg Oral BID  . enoxaparin  40 mg Subcutaneous Q24H  . FLUoxetine  40 mg Oral Daily  . furosemide  80 mg Intravenous Once  . furosemide  80 mg Intravenous TID  . gabapentin  300 mg Oral TID  . influenza  inactive virus vaccine  0.5 mL Intramuscular Once  . insulin aspart  0-15 Units Subcutaneous TID WC & HS  . insulin glargine  15 Units Subcutaneous QHS  . metoprolol tartrate  12.5 mg Oral BID  . nitroGLYCERIN  1  inch Topical Once  . pantoprazole  40 mg Oral Q1200  . pneumococcal 23 valent vaccine  0.5 mL Intramuscular Tomorrow-1000  . sodium chloride  3 mL Intravenous Q12H  . spironolactone  25 mg Oral Daily  . terazosin  1 mg Oral QHS  . zolpidem  5 mg Oral Once  . DISCONTD: FLUoxetine  40 mg Oral Daily  . DISCONTD: furosemide  80 mg Intravenous BID   Continuous Infusions:  PRN Meds:.acetaminophen, acetaminophen, traMADol  Assessment/Plan: The patient is a 46 yo man, history of CHF (EF 20-25%) s/p ICD placement, DM (Hb A1C = 7.1), Hypertension, presenting with shortness of breath, orthopnea, and PND, consistent with CHF exacerbation.   # Acute on chronic CHF - History of non-ischemic CM, EF 20-25%, likely secondary to prior heavy alcohol usage. Patient presents  with orthopnea, PND, and increased DOE, with elevated pro-BNP, consistent with acute CHF exacerbation. Troponin negative x1, EKG unremarkable. S/p AICD placement 04/2007. Trigger for current exacerbation may be iatrogenic volume overload. No evidence for myocardial ischemia vs arrythmia vs uncontrolled HTN vs AKI.  -IV lasix 80 BID -> increase to TID -Echo performed, results pending -continue benazepril, spironolactone  -metoprolol, continue at discharge -aspirin  -flu vaccine, pneumovax   # Hypertension - chronic  -continue spironolactone, benzepril , metoprolol -IV lasix   # Hematuria - UA with 3-6 WBC's.  Chart review reveals prior UA's with 21-50 RBC's (11/2008, 07/2009).  Patient notes a history of nephrolithiasis 6 years ago, with occasional flank pain since that time, and some LUTS, pointing to an etiology of BPH vs nephrolithiasis.  No personal history of cancer, family history of GU cancer, known family history of sickle trait, or tobacco smoking history.  UA does not suggest UTI.  Bilateral Renal US 08/15/09 unremarkable, CT abd/pelvis 08/14/09 unremarkable. -add flomax for possible BPH -consider outpatient urology follow-up at discharge -given stability of hematuria, no need to repeat renal US or CT abd/pelvis.  Patient cannot tolerate MRI (AICD).  # DM - Hb A1C = 7.1  -continue lantus 15 qhs  -SSI  -hold glipizide while inpatient   # CRI - Baseline Cr = 1.3-1.4, likely due to poorly controlled HTN. Cr currently at baseline.  -follow BMETs with diuresis   # History of PUD - reports mild abdominal pain  -protonix   # OSA - chronic, stable  -CPAP   # Depression  -continue prozac, amitriptyline   # Prophy - lovenox   LOS: 1 day   [name] 03/16/2011, 10:56 AM  Internal Medicine Teaching Service Attending Note Date: 03/16/2011  Patient name: [name]  Medical record number: [id number]  Date of birth: 1965/09/04    This patient has been seen and discussed with  the house staff. Please see their note for complete details. I concur with their findings as outlined by Dr. Manson Passey.  Judyann Munson 03/16/2011, 3:51 PM

## 2011-03-17 LAB — CBC
HCT: 42.8 % (ref 39.0–52.0)
Hemoglobin: 14.3 g/dL (ref 13.0–17.0)
MCV: 89.2 fL (ref 78.0–100.0)
RDW: 15.1 % (ref 11.5–15.5)
WBC: 7.7 10*3/uL (ref 4.0–10.5)

## 2011-03-17 LAB — BASIC METABOLIC PANEL
BUN: 26 mg/dL — ABNORMAL HIGH (ref 6–23)
CO2: 28 mEq/L (ref 19–32)
Chloride: 97 mEq/L (ref 96–112)
Creatinine, Ser: 1.77 mg/dL — ABNORMAL HIGH (ref 0.50–1.35)
Glucose, Bld: 117 mg/dL — ABNORMAL HIGH (ref 70–99)
Potassium: 4.6 mEq/L (ref 3.5–5.1)

## 2011-03-17 LAB — GLUCOSE, CAPILLARY
Glucose-Capillary: 127 mg/dL — ABNORMAL HIGH (ref 70–99)
Glucose-Capillary: 138 mg/dL — ABNORMAL HIGH (ref 70–99)
Glucose-Capillary: 166 mg/dL — ABNORMAL HIGH (ref 70–99)

## 2011-03-17 NOTE — Progress Notes (Signed)
03/17/11 1430 UR Completed. Tera Mater, RN, BSN [phone]

## 2011-03-17 NOTE — Progress Notes (Addendum)
Subjective: The patient had an asymptomatic 16-beat run of Vtach overnight, and another asymptomatic 14-beat run of Vtach this morning.  He notes that his ICD has not been checked in over 2 years.  He notes decreased SOB this morning.  His I's/O's reveal a net negative 3.2 L in the last 24 hours, though his creatinine is now starting to rise.  Objective: Vital signs in last 24 hours: Filed Vitals:   03/16/11 2112 03/17/11 0404 03/17/11 0619 03/17/11 1122  BP: 134/93 105/72 107/79 105/73  Pulse: 91 77 81 89  Temp: 98.5 F (36.9 C)  97 F (36.1 C) 98.5 F (36.9 C)  TempSrc: Oral  Axillary Oral  Resp: 18 18 19 20   Height:      Weight:   242 lb 1 oz (109.8 kg)   SpO2: 98%  98% 98%   Weight change: -3 lb 15.5 oz (-1.8 kg)  Intake/Output Summary (Last 24 hours) at 03/17/11 1142 Last data filed at 03/17/11 0929  Gross per 24 hour  Intake   1640 ml  Output   5875 ml  Net  -4235 ml   Physical Exam: General: alert, cooperative, and in no apparent distress HEENT: pupils equal round and reactive to light, vision grossly intact, oropharynx clear and non-erythematous  Neck: supple, no lymphadenopathy Lungs: diffusely diminished breath sounds, mildly increased work of respiration, no wheezes, rales, ronchi Heart: Regular rate and rhythm, no m/g/r Abdomen: round, non-tender, non-distended, normal bowel sounds  Extremities: 2+ pitting edema bilaterally Neurologic: alert & oriented X3, cranial nerves II-XII intact, strength grossly intact, sensation intact to light touch  Lab Results: Basic Metabolic Panel:  Lab 03/17/11 2841 03/16/11 0645  NA 134* 136  K 4.6 4.5  CL 97 105  CO2 28 19  GLUCOSE 117* 111*  BUN 26* 27*  CREATININE 1.77* 1.39*  CALCIUM 8.9 9.0  MG -- --  PHOS -- --   Liver Function Tests:  Lab 03/16/11 0645 03/15/11 1242  AST 24 22  ALT 13 13  ALKPHOS 72 76  BILITOT 0.9 1.1  PROT 6.9 7.1  ALBUMIN 2.5* 2.8*   CBC:  Lab 03/17/11 0629 03/16/11 0645 03/15/11  1242  WBC 7.7 4.9 --  NEUTROABS -- -- 3.1  HGB 14.3 14.1 --  HCT 42.8 42.3 --  MCV 89.2 88.5 --  PLT 248 255 --   BNP:  Lab 03/15/11 1242  PROBNP 4353.0*   CBG:  Lab 03/17/11 1109 03/17/11 0623 03/16/11 2126 03/16/11 1616 03/16/11 1136 03/16/11 0554  GLUCAP 138* 127* 160* 164* 174* 105*   Studies/Results: Dg Chest Port 1 View  03/15/2011  *RADIOLOGY REPORT*  Clinical Data: Shortness of breath, chest pain  PORTABLE CHEST - 1 VIEW  Comparison: 02/13/2011  Findings: Lungs are essentially clear.  No frank interstitial edema.  No pleural effusion or pneumothorax.  Stable cardiomegaly.  Left subclavian ICD.  IMPRESSION: No evidence of acute cardiopulmonary disease.  Stable cardiomegaly.  Original Report Authenticated By: Charline Bills, M.D.   Medications: I have reviewed the patient's current medications. Scheduled Meds:    . amitriptyline  10 mg Oral QHS  . aspirin  81 mg Oral Daily  . atorvastatin  10 mg Oral q1800  . benazepril  40 mg Oral Daily  . docusate sodium  100 mg Oral BID  . enoxaparin  40 mg Subcutaneous Q24H  . FLUoxetine  40 mg Oral Daily  . gabapentin  300 mg Oral TID  . influenza  inactive virus vaccine  0.5  mL Intramuscular Once  . insulin aspart  0-15 Units Subcutaneous TID WC & HS  . insulin glargine  15 Units Subcutaneous QHS  . metoprolol tartrate  12.5 mg Oral BID  . pantoprazole  40 mg Oral Q1200  . sodium chloride  3 mL Intravenous Q12H  . spironolactone  25 mg Oral Daily  . terazosin  1 mg Oral QHS  . DISCONTD: furosemide  80 mg Intravenous Q8H  . DISCONTD: furosemide  80 mg Intravenous Q8H   Continuous Infusions:  PRN Meds:.acetaminophen, acetaminophen, traMADol, zolpidem  Assessment/Plan: The patient is a 46 yo man, history of CHF (EF 20-25%) s/p ICD placement, DM (Hb A1C = 7.1), Hypertension, presenting with shortness of breath, orthopnea, and PND, consistent with CHF exacerbation.   # Acute on chronic CHF - History of non-ischemic CM, EF  20-25%, likely secondary to prior heavy alcohol usage. Patient presents with orthopnea, PND, and increased DOE, with elevated pro-BNP, consistent with acute CHF exacerbation. Troponin negative x1, EKG unremarkable. S/p AICD placement 04/2007.  SOB has improved, and now creatinine is starting to rise.  Net negative this admission 4.9 L, weight has decreased from 246 to 242. -d/c TID IV lasix, restart PO lasix tomorrow -Echo performed, results pending -continue benazepril, spironolactone  -metoprolol, continue at discharge -aspirin  -flu vaccine, pneumovax   # Hypertension - chronic  -continue spironolactone, benzepril , metoprolol -restart PO lasix tomorrow  # Asymptomatic Vtach - the patient experienced 3 distinct runs of asymptomatic Vtach overnight.  The patient has AICD in place, though he notes he has not had the instrument interrogated in several years. -arrange for outpatient cardiology appointment  # Hematuria - UA with 3-6 WBC's.  Chart review reveals prior UA's with 21-50 RBC's (11/2008, 07/2009).  Patient notes a history of nephrolithiasis 6 years ago, with occasional flank pain since that time, and some LUTS, pointing to an etiology of BPH vs nephrolithiasis.  No personal history of cancer, family history of GU cancer, known family history of sickle trait, or tobacco smoking history.  UA does not suggest UTI.  Bilateral Renal US 08/15/09 unremarkable, CT abd/pelvis 08/14/09 unremarkable. -add flomax for possible BPH -consider outpatient urology follow-up at discharge -given stability of hematuria, no need to repeat renal US or CT abd/pelvis.  Patient cannot tolerate MRI (AICD).  # DM - Hb A1C = 7.1  -continue lantus 15 qhs  -SSI  -hold glipizide while inpatient   # CRI - Baseline Cr = 1.3-1.4, likely due to poorly controlled HTN. Cr has now increased to 1.77  -follow BMETs with diuresis  -stop IV lasix today  # History of PUD - reports mild abdominal pain  -protonix   # OSA -  chronic, stable  -CPAP -try continuous O2 monitoring tonight with CPAP  # Depression  -continue prozac, amitriptyline   # Prophy - lovenox   LOS: 2 days   Janalyn Harder 03/17/2011, 11:42 AM  Internal Medicine Teaching Service Attending Note Date: 03/17/2011  Patient name: [name]  Medical record number: [id number]  Date of birth: 05/29/1965    This patient has been seen and discussed with the house staff. Please see their note for complete details. I concur with their findings with the following additions/corrections: Mr. Beggs somewhat fatigue this morning, excessive sleepiness, not related to pain medication. Will do overnight pulse oximetry to see if patient suffers from hypoxia despite cpap use. He has had 2-3 episodes of 5-16 bts of NSVT, asymptomatic, but patient has not had  history of his AICD firing. He has not had recent cards followup for which we will initiate as a o/p. At least have his aicd interrogated since not done in 2 yrs.  Judyann Munson 03/17/2011, 4:09 PM

## 2011-03-17 NOTE — Progress Notes (Signed)
Pt. Placed himself on CPAP via nasal mask, auto titrate. Pt. Is tolerating well at this time.

## 2011-03-17 NOTE — Progress Notes (Signed)
Patient continues to complain of cramps in his stomach area, he is very drowsy. Pain medication that was given is not helping. VS: 98.5 89 20 105/73 CBG 138 98% on RA. MD notified no new orders given at this time.

## 2011-03-17 NOTE — Progress Notes (Signed)
Patient had a 14 beat run of V-Tach. MD notified no new orders given at this time.

## 2011-03-17 NOTE — Significant Event (Signed)
@   69- pt had 16 beats run of vtach, pt asleep resting well, no c/o pain at this time. Dr. Yaakov Guthrie was notified, no new orders at this time, will continue to monitor.  Filed Vitals:   03/17/11 0404  BP: 105/72  Pulse: 77  Temp:   Resp: 18    Paralee Pendergrass, [address]

## 2011-03-18 ENCOUNTER — Other Ambulatory Visit: Payer: Self-pay | Admitting: Internal Medicine

## 2011-03-18 DIAGNOSIS — G4733 Obstructive sleep apnea (adult) (pediatric): Secondary | ICD-10-CM

## 2011-03-18 LAB — BASIC METABOLIC PANEL
Calcium: 8.7 mg/dL (ref 8.4–10.5)
Creatinine, Ser: 1.77 mg/dL — ABNORMAL HIGH (ref 0.50–1.35)
GFR calc non Af Amer: 45 mL/min — ABNORMAL LOW (ref >90)
Glucose, Bld: 123 mg/dL — ABNORMAL HIGH (ref 70–99)
Sodium: 130 mEq/L — ABNORMAL LOW (ref 135–145)

## 2011-03-18 LAB — CBC
MCH: 29.1 pg (ref 26.0–34.0)
MCHC: 32.7 g/dL (ref 30.0–36.0)
Platelets: 239 10*3/uL (ref 150–400)
RBC: 4.44 MIL/uL (ref 4.22–5.81)
RDW: 15 % (ref 11.5–15.5)

## 2011-03-18 LAB — GLUCOSE, CAPILLARY

## 2011-03-18 MED ORDER — TERAZOSIN HCL 1 MG PO CAPS: 1.0000 mg | ORAL_CAPSULE | Freq: Every day | ORAL | Status: DC

## 2011-03-18 MED ORDER — FUROSEMIDE 80 MG PO TABS: 80.0000 mg | ORAL_TABLET | Freq: Two times a day (BID) | ORAL | Status: DC

## 2011-03-18 MED ORDER — METOPROLOL TARTRATE 25 MG PO TABS: 25.0000 mg | ORAL_TABLET | Freq: Two times a day (BID) | ORAL | Status: DC

## 2011-03-18 MED ORDER — TRAZODONE 25 MG HALF TABLET: 25.0000 mg | ORAL_TABLET | Freq: Every day | ORAL | Status: DC

## 2011-03-18 NOTE — Progress Notes (Signed)
Subjective: No acute events overnight.  The patient had no further episodes of Vtach on telemetry overnight.  The patient notes that his breathing is back to his baseline.  Objective: Vital signs in last 24 hours: Filed Vitals:   03/17/11 1407 03/17/11 2046 03/17/11 2346 03/18/11 0458  BP: 105/84 104/87  98/66  Pulse: 81 94 95 77  Temp: 97.1 F (36.2 C) 98.7 F (37.1 C)  97.5 F (36.4 C)  TempSrc: Oral Oral  Oral  Resp: 20 20 16 18   Height:      Weight:    245 lb (111.131 kg)  SpO2: 100% 99% 98% 100%   Weight change: 2 lb 15 oz (1.331 kg)  Intake/Output Summary (Last 24 hours) at 03/18/11 0272 Last data filed at 03/18/11 0234  Gross per 24 hour  Intake   1920 ml  Output   3025 ml  Net  -1105 ml   Physical Exam: General: alert, cooperative, and in no apparent distress HEENT: pupils equal round and reactive to light, vision grossly intact, oropharynx clear and non-erythematous  Neck: supple, no lymphadenopathy Lungs: diffusely diminished breath sounds, normal work of respiration, no wheezes, rales, ronchi Heart: Regular rate and rhythm, no m/g/r Abdomen: round, non-tender, non-distended, normal bowel sounds  Extremities: non-pitting edema bilaterally Neurologic: alert & oriented X3, cranial nerves II-XII intact, strength grossly intact, sensation intact to light touch  Lab Results: Basic Metabolic Panel:  Lab 03/17/11 5366 03/16/11 0645  NA 134* 136  K 4.6 4.5  CL 97 105  CO2 28 19  GLUCOSE 117* 111*  BUN 26* 27*  CREATININE 1.77* 1.39*  CALCIUM 8.9 9.0  MG -- --  PHOS -- --   Liver Function Tests:  Lab 03/16/11 0645 03/15/11 1242  AST 24 22  ALT 13 13  ALKPHOS 72 76  BILITOT 0.9 1.1  PROT 6.9 7.1  ALBUMIN 2.5* 2.8*   CBC:  Lab 03/18/11 0700 03/17/11 0629 03/15/11 1242  WBC 8.2 7.7 --  NEUTROABS -- -- 3.1  HGB 12.9* 14.3 --  HCT 39.4 42.8 --  MCV 88.7 89.2 --  PLT 239 248 --   BNP:  Lab 03/15/11 1242  PROBNP 4353.0*   CBG:  Lab 03/18/11  0627 03/17/11 2043 03/17/11 1617 03/17/11 1109 03/17/11 0623 03/16/11 2126  GLUCAP 143* 166* 139* 138* 127* 160*   Studies/Results: No results found. Medications: I have reviewed the patient's current medications. Scheduled Meds:    . amitriptyline  10 mg Oral QHS  . aspirin  81 mg Oral Daily  . atorvastatin  10 mg Oral q1800  . benazepril  40 mg Oral Daily  . docusate sodium  100 mg Oral BID  . enoxaparin  40 mg Subcutaneous Q24H  . FLUoxetine  40 mg Oral Daily  . gabapentin  300 mg Oral TID  . influenza  inactive virus vaccine  0.5 mL Intramuscular Once  . insulin aspart  0-15 Units Subcutaneous TID WC & HS  . insulin glargine  15 Units Subcutaneous QHS  . pantoprazole  40 mg Oral Q1200  . sodium chloride  3 mL Intravenous Q12H  . spironolactone  25 mg Oral Daily  . terazosin  1 mg Oral QHS  . DISCONTD: furosemide  80 mg Intravenous Q8H  . DISCONTD: metoprolol tartrate  12.5 mg Oral BID   Continuous Infusions:  PRN Meds:.acetaminophen, acetaminophen, traMADol, zolpidem  Assessment/Plan: The patient is a 46 yo man, history of CHF (EF 20-25%) s/p ICD placement, DM (Hb A1C =  7.1), Hypertension, presenting with shortness of breath, orthopnea, and PND, consistent with CHF exacerbation.   # Acute on chronic CHF - History of non-ischemic CM, EF 20-25%, likely secondary to prior heavy alcohol usage. Patient presents with orthopnea, PND, and increased DOE, with elevated pro-BNP, consistent with acute CHF exacerbation. Troponin negative x1, EKG unremarkable. S/p AICD placement 04/2007.  SOB has improved, though creatinine rose yesterday morning, net negative this admission 5.7 L. -restart PO lasix at discharge -continue benazepril, spironolactone  -metoprolol, continue at discharge -aspirin   # Hypertension - chronic  -continue spironolactone, benzepril , metoprolol -restart PO lasix  # Asymptomatic Vtach - the patient experienced 3 distinct runs of asymptomatic Vtach 2 days ago,  though no episodes overnight.  The patient has AICD in place, though he notes he has not had the instrument interrogated in several years. -arrange for outpatient cardiology appointment  # Hematuria - UA with 3-6 WBC's.  Chart review reveals prior UA's with 21-50 RBC's (11/2008, 07/2009).  Patient notes a history of nephrolithiasis 6 years ago, with occasional flank pain since that time, and some LUTS, pointing to an etiology of BPH vs nephrolithiasis.  No personal history of cancer, family history of GU cancer, known family history of sickle trait, or tobacco smoking history.  UA does not suggest UTI.  Bilateral Renal US 08/15/09 unremarkable, CT abd/pelvis 08/14/09 unremarkable. -add flomax for possible BPH -consider outpatient urology follow-up at discharge -given stability of hematuria and recent negative scans, no need to repeat renal US or CT abd/pelvis.  Patient cannot tolerate MRI (AICD).  # DM - Hb A1C = 7.1  -continue lantus 15 qhs  -SSI  -hold glipizide while inpatient   # CRI - Baseline Cr = 1.3-1.4, likely due to poorly controlled HTN. Cr has now increased to 1.77  -follow BMETs with diuresis  -stop IV lasix today  # History of PUD - reports mild abdominal pain  -protonix   # OSA - chronic, stable.  O2 sats = 98-100% on CPAP overnight -CPAP  # Depression  -continue prozac, amitriptyline   # Prophy - lovenox   LOS: 3 days   Janalyn Harder 03/18/2011, 8:28 AM

## 2011-03-18 NOTE — Discharge Instructions (Signed)
Heart Failure Heart failure (HF) is a condition in which the heart has trouble pumping blood. This means your heart does not pump blood efficiently for your body to work well. In some cases of HF, fluid may back up into your lungs or you may have swelling (edema) in your lower legs. HF is a long-term (chronic) condition. It is important for you to take good care of yourself and follow your caregiver's treatment plan. CAUSES   Health conditions:   High blood pressure (hypertension) causes the heart muscle to work harder than normal. When pressure in the blood vessels is high, the heart needs to pump (contract) with more force in order to circulate blood throughout the body. High blood pressure eventually causes the heart to become stiff and weak.   Coronary artery disease (CAD) is the buildup of cholesterol and fat (plaques) in the arteries of the heart. The blockage in the arteries deprives the heart muscle of oxygen and blood. This can cause chest pain and may lead to a heart attack. High blood pressure can also contribute to CAD.   Heart attack (myocardial infarction) occurs when 1 or more arteries in the heart become blocked. The loss of oxygen damages the muscle tissue of the heart. When this happens, part of the heart muscle dies. The injured tissue does not contract as well and weakens the heart's ability to pump blood.   Abnormal heart valves can cause HF when the heart valves do not open and close properly. This makes the heart muscle pump harder to keep the blood flowing.   Heart muscle disease (cardiomyopathy or myocarditis) is damage to the heart muscle from a variety of causes. These can include drug or alcohol abuse, infections, or unknown reasons. These can increase the risk of HF.   Lung disease makes the heart work harder because the lungs do not work properly. This can cause a strain on the heart leading it to fail.   Diabetes increases the risk of HF. High blood sugar contributes  to high fat (lipid) levels in the blood. Diabetes can also cause slow damage to tiny blood vessels that carry important nutrients to the heart muscle. When the heart does not get enough oxygen and food, it can cause the heart to become weak and stiff. This leads to a heart that does not contract efficiently.   Other diseases can contribute to HF. These include abnormal heart rhythms, thyroid problems, and low blood counts (anemia).   Unhealthy lifestyle habits:   Obesity.   Smoking.   Eating foods high in fat and cholesterol.   Eating or drinking beverages high in salt.   Drug or alcohol abuse.   Lack of exercise.  SYMPTOMS  HF symptoms may vary and can be hard to detect. Symptoms may include:  Shortness of breath with activity, such as climbing stairs.   Persistent cough.   Swelling of the feet, ankles, legs, or abdomen.   Unexplained weight gain.   Difficulty breathing when lying flat.   Waking from sleep because of the need to sit up and get more air.   Rapid heartbeat.   Fatigue and loss of energy.   Feeling lightheaded or close to fainting.  DIAGNOSIS  A diagnosis of HF is based on your history, symptoms, physical examination, and diagnostic tests. Diagnostic tests for HF may include:  EKG.   Chest X-ray.   Blood tests.   Exercise stress test.   Blood oxygen test (arterial blood gas).   Evaluation   by a heart doctor (cardiologist).   Ultrasound evaluation of the heart (echocardiogram).   Heart artery test to look for blockages (angiogram).   Radioactive imaging to look at the heart (radionuclide test).  TREATMENT  Treatment is aimed at managing the symptoms of HF. Medicines, lifestyle changes, or surgical intervention may be necessary to treat HF.  Medicines to help treat HF may include:   Angiotensin-converting enzyme (ACE) inhibitors. These block the effects of a blood protein called angiotensin-converting enzyme. ACE inhibitors relax (dilate) the  blood vessels and help lower blood pressure. This decreases the workload of the heart, slows the progression of HF, and improves symptoms.   Angiotensin receptor blockers (ARBs). These medications work similar to ACE inhibitors. ARBs may be an alternative for people who cannot tolerate an ACE inhibitor.   Aldosterone antagonists. This medication helps get rid of extra fluid from your body. This lowers the volume of blood the heart has to pump.   Water pills (diuretics). Diuretics cause the kidneys to remove salt and water from the blood. The extra fluid is removed by urination. By removing extra fluid from the body, diuretics help lower the workload of the heart and help prevent fluid buildup in the lungs so breathing is easier.   Beta blockers. These prevent the heart from beating too fast and improve heart muscle strength. Beta blockers help maintain a normal heart rate, control blood pressure, and improve HF symptoms.   Digitalis. This increases the force of the heartbeat and may be helpful to people with HF or heart rhythm problems.   Healthy lifestyle changes include:   Stopping smoking.   Eating a healthy diet. Avoid foods high in fat. Avoid foods fried in oil or made with fat. A dietician can help with healthy food choices.   Limiting how much salt you eat.   Limiting alcohol intake to no more than 1 drink per day for women and 2 drinks per day for men. Drinking more than that is harmful to your heart. If your heart has already been damaged by alcohol or you have severe HF, drinking alcohol should be stopped completely.   Exercising as directed by your caregiver.   Surgical treatment for HF may include:   Procedures to open blocked arteries, repair damaged heart valves, or remove damaged heart muscle tissue.   A pacemaker to help heart muscle function and to control certain abnormal heart rhythms.   A defibrillator to possibly prevent sudden cardiac death.  HOME CARE  INSTRUCTIONS   Activity level. Your caregiver can help you determine what type of exercise program may be helpful. It is important to maintain your strength. Pace your physical activity to avoid shortness of breath or chest pain. Rest for 1 hour before and after meals. A cardiac rehabilitation program may be helpful to some people with HF.   Diet. Eat a heart healthy diet. Food choices should be low in saturated fat and cholesterol. Talk to a dietician to learn about heart healthy foods.   Salt intake. When you have HF, you need to limit the amount of salt you eat. Eat less than 1500 milligrams (mg) of salt per day or as recommended by your caregiver.   Weight monitoring. Weigh yourself every day. You should weigh yourself in the morning after you urinate and before you eat breakfast. Wear the same amount of clothing each time you weigh yourself. Record your weight daily. Bring your recorded weights to your clinic visits. Tell your caregiver right away if   you have gained 3 lb/1.4 kg in 1 day, or 5 lb/2.3 kg in a week or whatever amount you were told to report.   Blood pressure monitoring. This should be done as directed by your caregiver. A home blood pressure cuff can be purchased at a drugstore. Record your blood pressure numbers and bring them to your clinic visits. Tell your caregiver if you become dizzy or lightheaded upon standing up.   Smoking. If you are currently a smoker, it is time to quit. Nicotine makes your heart work harder by causing your blood vessels to constrict. Do not use nicotine gum or patches before talking to your caregiver.   Follow up. Be sure to schedule a follow-up visit with your caregiver. Keep all your appointments.  SEEK MEDICAL CARE IF:   Your weight increases by 3 lb/1.4 kg in 1 day or 5 lb/2.3 kg in a week.   You notice increasing shortness of breath that is unusual for you. This may happen during rest, sleep, or with activity.   You cough more than normal,  especially with physical activity.   You notice more swelling in your hands, feet, ankles, or belly (abdomen).   You are unable to sleep because it is hard to breathe.   You cough up bloody mucus (sputum).   You begin to feel "jumping" or "fluttering" sensations (palpitations) in your chest.  SEEK IMMEDIATE MEDICAL CARE IF:   You have severe chest pain or pressure which may include symptoms such as:   Pain or pressure in the arms, neck, jaw, or back.   Feeling sweaty.   Feeling sick to your stomach (nauseous).   Feeling short of breath while at rest.   Having a fast or irregular heartbeat.   You experience stroke symptoms. These symptoms include:   Facial weakness or numbness.   Weakness or numbness in an arm, leg, or on one side of your body.   Blurred vision.   Difficulty talking or thinking.   Dizziness or fainting.   Severe headache.  THESE ARE MEDICAL EMERGENCIES. Do not wait to see if the symptoms go away. Call your local emergency services (911 in [place] DO NOT drive yourself to the hospital. IMPORTANT  Make a list of every medicine, vitamin, or herbal supplement you are taking. Keep the list with you at all times. Show it to your caregiver at every visit. Keep the list up-to-date.   Ask your caregiver or pharmacist to write an explanation of each medicine you are taking. This should include:   Why you are taking it.   The possible side effects.   The best time of day to take it.   Foods to take with it or what foods to avoid.   When to stop taking it.  MAKE SURE YOU:   Understand these instructions.   Will watch your condition.   Will get help right away if you are not doing well or get worse.  Document Released: 01/04/2005 Document Revised: 09/16/2010 Document Reviewed: 04/18/2009 [employer] Patient Information 2012 [employer] 

## 2011-03-18 NOTE — Discharge Summary (Signed)
Internal Medicine Teaching [facility] Discharge Note  Name: [name] MRN: [id number] DOB: 1965-05-08 46 y.o.  Date of Admission: 03/15/2011 10:52 AM Date of Discharge: 03/18/2011 Attending Physician: Judyann Munson, MD  Discharge Diagnosis: 1. Acute on chronic CHF - NICM with EF 20-25%, s/p ICD placement 04/2007 2. Chronic renal insufficiency - baseline Cr = 1.3-1.4, slight increase in creatinine on day prior to discharge 3. Diabetes Mellitus - Hb A1C = 7.1 4. Obstructive sleep apnea - sleep study from 2008 shows mild OSA, patient has home O2, referring for repeat sleep study for purposes of obtaining CPAP 5. Hematuria - since 2009, prior CT/US neg, history of nephrolithiasis 6. Hypertension 7. Depression  Discharge Medications: Medication List  As of 03/18/2011 12:26 PM   TAKE these medications         amitriptyline 10 MG tablet   Commonly known as: ELAVIL   Take 10 mg by mouth at bedtime.      benazepril 40 MG tablet   Commonly known as: LOTENSIN   Take 40 mg by mouth daily.      FLUoxetine 40 MG capsule   Commonly known as: PROZAC   Take 40 mg by mouth daily.      furosemide 80 MG tablet   Commonly known as: LASIX   Take 1 tablet (80 mg total) by mouth 2 (two) times daily.      gabapentin 300 MG capsule   Commonly known as: NEURONTIN   Take 300 mg by mouth 3 (three) times daily.      glipiZIDE 10 MG tablet   Commonly known as: GLUCOTROL   Take 10 mg by mouth daily with breakfast.      insulin glargine 100 UNIT/ML injection   Commonly known as: LANTUS   Inject 15 Units into the skin at bedtime.      metoprolol tartrate 25 MG tablet   Commonly known as: LOPRESSOR   Take 1 tablet (25 mg total) by mouth 2 (two) times daily.      rosuvastatin 10 MG tablet   Commonly known as: CRESTOR   Take 10 mg by mouth daily.      spironolactone 25 MG tablet   Commonly known as: ALDACTONE   Take 25 mg by mouth daily.      terazosin 1 MG capsule   Commonly known  as: HYTRIN   Take 1 capsule (1 mg total) by mouth at bedtime.      traZODone 25 mg Tabs   Commonly known as: DESYREL   Take 0.5 tablets (25 mg total) by mouth at bedtime.            Disposition and follow-up:   [name] was discharged from [facility] in stable and improved condition, with improvement in SOB.  The patient will follow-up with Tereso Newcomer, PA from [facility] Cardiology to follow-up his CHF, and have his ICD interrogated (patient lost to cardiology f/u for approx 2 yrs).  The patient will follow-up with Dr. Arvilla Market on 03/25/11, who will check a BMET to ensure return of creatinine to baseline 1.4 after aggressive inpatient diuresis, and consider mildly increasing lasix dose if patient's symptoms of SOB or LE edema have worsened.  Follow-up Appointments: Discharge Orders    Future Appointments: Provider: Department: Dept Phone: Center:   03/25/2011 2:15 PM Nelda Bucks, MD [facility] [phone] [facility]   04/01/2011 8:30 AM Beatrice Lecher, PA [facility] [phone] [facility]   04/01/2011 9:00 AM  [facility] [phone] [facility]     Future Orders Please Complete By Expires   Diet - low sodium heart healthy      Increase activity slowly      Discharge instructions      Comments:   You have been hospitalized with Heart Failure, which caused a buildup of fluid in your body.  To prevent future hospitalizations, it is important to weigh yourself daily, and if you find that you are gaining more than 3 pounds per day, or more than 5 pounds in 1 week, call your physician immediately.  Also, note the following medications for heart failure: 1. Do not take lasix today (thursday, 2/28), then re-start taking lasix tomorrow, 1 tablet (80 mg) twice per day 2. We are re-prescribing Metoprolol, 1 tablet (25 mg) twice per day 3. Continue Benazepril and Spironolactone   (HEART FAILURE PATIENTS) Call MD:  Anytime you have any of  the following symptoms: 1) 3 pound weight gain in 24 hours or 5 pounds in 1 week 2) shortness of breath, with or without a dry hacking cough 3) swelling in the hands, feet or stomach 4) if you have to sleep on extra pillows at night in order to breathe.         Consultations:    Procedures Performed:  Dg Chest Port 1 View  03/15/2011  *RADIOLOGY REPORT*  Clinical Data: Shortness of breath, chest pain  PORTABLE CHEST - 1 VIEW  Comparison: 02/13/2011  Findings: Lungs are essentially clear.  No frank interstitial edema.  No pleural effusion or pneumothorax.  Stable cardiomegaly.  Left subclavian ICD.  IMPRESSION: No evidence of acute cardiopulmonary disease.  Stable cardiomegaly.  Original Report Authenticated By: Charline Bills, M.D.    2D Echo:  Study Conclusions  - Left ventricle: The cavity size was severely dilated. Wall thickness was normal. The estimated ejection fraction was 20%. Diffuse hypokinesis. - Left atrium: The atrium was mildly dilated. - Right ventricle: The cavity size was mildly dilated. - Right atrium: The atrium was mildly dilated. - Atrial septum: No defect or patent foramen ovale was identified. - Pulmonary arteries: PA peak pressure: 64mm Hg (S).  Admission HPI:  The patient is a 46 yo man, history of CHF (dilated NICM EF 20-25%, likely secondary to prior alcohol use) s/p AICD placement 04/2007, DM (Hb A1C = 7.1), hypertension, and OSA presenting with shortness of breath. The patient notes a 3-week history of shortness of breath, previously being able to walk 2 blocks before becoming SOB, now having SOB when walking across the kitchen. He normally sleeps in a semi-reclined position, but for the last 3 weeks has had to sleep sitting up in a chair. He describes PND 3-4 x/night during this time period. He notes significant bilateral leg swelling, tightness, and pain, and decreased volume of dark slightly red-tinged urine during this time. The patient also notes mild  non-productive cough. No chest pain, headache, LH, fevers, nausea/vomiting, diarrhea/constipation, dysuria. The patient has been quadrupling his lasix dose at home to try to improve his symptoms. The patient presented with symptoms of a non-productive cough 3 weeks ago, was given IV fluids and a prescription for mucinex, and was discharged with a diagnosis of viral URI.  Admission Physical Exam: Blood pressure 135/97, pulse 100, temperature 97.8 F (36.6 C), temperature source Oral, resp. rate 25, SpO2 100.00%.  General: alert, cooperative, and in no apparent distress HEENT: pupils equal round and reactive to light, vision grossly intact, oropharynx  clear and non-erythematous  Neck: supple, no lymphadenopathy, +JVD Lungs: diffusely diminished breath sounds, mildly increased work of respiration, no wheezes, rales, ronchi Heart: tachycardic, regular rhythm, no m/g/r Abdomen: round, non-tender, non-distended, normal bowel sounds  Extremities: 2+ pitting edema to the level of the thigh Neurologic: alert & oriented X3, cranial nerves II-XII intact, strength grossly intact, sensation intact to light touch  Admission Labs: Na: 136, K: 4.8, Cl: 104, CO2: 25, BUN: 21, Cr: 1.42, Glucose: 119 WBC: 5.3, Hb: 13.3, HCT: 41.4, Plt: 285 Pro-BNP: 4353  Hospital Course by problem list: 1. Acute on chronic CHF - The patient has a history of dilated NICM with EF = 20-25%, s/p ICD placement 04/2007.  The patient presented with orthopnea, PND, increased dyspnea on exertion, and elevated pro-BNP, consistent with CHF exacerbation.  The patient was treated with IV lasix 80 mg TID, with a net negative fluid balance of 5.4 L over the course of hospitalization.  Two days after admission, the patient's creatinine increased from 1.4 to 1.77, likely due to increased lasix use.  Lasix was held on the day of discharge, with instructions to restart his home lasix dose of 80 mg BID on the day after discharge.  The patient was  re-started on a beta blocker during this admission (history of using metoprolol, but this medication appears to have accidentally fallen off of his med list 1-2 months ago), to be continued at discharge as part of the heart failure core measures protocol.  2. Chronic renal insufficiency - The patient has a history of CRI, likely secondary to diabetes and hypertension, with baseline Cr = 1.3-1.4.  The patient's creatinine increased to 1.77 two days after admission, likely due to increased lasix use.  The patient will follow-up with the Internal Medicine clinic in 1 week to check BMET to ensure appropriate decrease in creatinine.  3. Diabetes Mellitus - The patient has a history of diabetes, with last Hb A1C = 7.1, managed with home Lantus and glipizide.  The patient was managed with lantus and sliding scale insulin during hospitalization, and was discharged on his home regimen.  4. Obstructive sleep apnea - The patient has a history of OSA, diagnosed via sleep study 11/2006.  The patient is currently being treated with outpatient home oxygen, and notes that he has never had home CPAP.  The patient has a history of hypertension, and notes daytime sleepiness, snoring at night, and non-restorative sleep.  We have referred the patient for a repeat outpatient sleep study and CPAP titration.  The patient was maintained on CPAP during hospitalization, with improvement in daytime sleepiness and improvement in blood pressures.  5. Hematuria - The patient has a history of RBC's on UA since 2009, with 0-3 RBC's seen on UA during this admission, and as many as 21-50 RBC's on UA's in 2010 and 2011.  The patient notes a history of nephrolithiasis several years ago, with occasional L vs R mild intermittent flank pain.  The patient also notes some mild, progressive LUTS.  Etiology of hematuria is likely nephrolithiasis vs BPH.  CT abd/pelvis 07/2009 and  US renal/urinary tract 07/2009 unremarkable.  Started terazosin for BPH,  consider outpatient urology f/u.  Time spent on discharge: 45 minutes  Discharge Vitals:  BP 126/81  Pulse 82  Temp(Src) 97.5 F (36.4 C) (Oral)  Resp 18  Ht 6' (1.829 m)  Wt 245 lb (111.131 kg)  BMI 33.23 kg/m2  SpO2 100%  Discharge Labs:  Results for orders placed during the  hospital encounter of 03/15/11 (from the past 24 hour(s))  GLUCOSE, CAPILLARY     Status: Abnormal   Collection Time   03/17/11  4:17 PM      Component Value Range   Glucose-Capillary 139 (*) 70 - 99 (mg/dL)  GLUCOSE, CAPILLARY     Status: Abnormal   Collection Time   03/17/11  8:43 PM      Component Value Range   Glucose-Capillary 166 (*) 70 - 99 (mg/dL)   Comment 1 Notify RN    GLUCOSE, CAPILLARY     Status: Abnormal   Collection Time   03/18/11  6:27 AM      Component Value Range   Glucose-Capillary 143 (*) 70 - 99 (mg/dL)   Comment 1 Notify RN    BASIC METABOLIC PANEL     Status: Abnormal   Collection Time   03/18/11  7:00 AM      Component Value Range   Sodium 130 (*) 135 - 145 (mEq/L)   Potassium 4.0  3.5 - 5.1 (mEq/L)   Chloride 96  96 - 112 (mEq/L)   CO2 24  19 - 32 (mEq/L)   Glucose, Bld 123 (*) 70 - 99 (mg/dL)   BUN 29 (*) 6 - 23 (mg/dL)   Creatinine, Ser 1.61 (*) 0.50 - 1.35 (mg/dL)   Calcium 8.7  8.4 - 09.6 (mg/dL)   GFR calc non Af Amer 45 (*) >90 (mL/min)   GFR calc Af Amer 53 (*) >90 (mL/min)  CBC     Status: Abnormal   Collection Time   03/18/11  7:00 AM      Component Value Range   WBC 8.2  4.0 - 10.5 (K/uL)   RBC 4.44  4.22 - 5.81 (MIL/uL)   Hemoglobin 12.9 (*) 13.0 - 17.0 (g/dL)   HCT 04.5  40.9 - 81.1 (%)   MCV 88.7  78.0 - 100.0 (fL)   MCH 29.1  26.0 - 34.0 (pg)   MCHC 32.7  30.0 - 36.0 (g/dL)   RDW 91.4  78.2 - 95.6 (%)   Platelets 239  150 - 400 (K/uL)  GLUCOSE, CAPILLARY     Status: Abnormal   Collection Time   03/18/11 11:24 AM      Component Value Range   Glucose-Capillary 138 (*) 70 - 99 (mg/dL)   Comment 1 Notify RN      Signed: Janalyn Harder 03/18/2011,  12:26 PM

## 2011-03-18 NOTE — Progress Notes (Signed)
DC IV, DC Tele, DC home. Discharge instructions and home medications discussed with patient. Patient denies any questions or concerns at this time. Patient leaving unit ambulatory and appears in no acute distress.

## 2011-03-18 NOTE — Discharge Summary (Signed)
Internal Medicine Teaching Service Attending Note Date: 03/18/2011  Patient name: [name]  Medical record number: [id number]  Date of birth: 08-16-1965    This patient has been seen and discussed with the house staff. Please see their note for complete details. I concur with their findings and discharge plan as outlined by Dr. Manson Passey.  Judyann Munson 03/18/2011, 5:47 PM

## 2011-03-25 ENCOUNTER — Encounter: Payer: Self-pay | Admitting: Internal Medicine

## 2011-03-25 ENCOUNTER — Ambulatory Visit (INDEPENDENT_AMBULATORY_CARE_PROVIDER_SITE_OTHER): Payer: Medicare Other | Admitting: Internal Medicine

## 2011-03-25 DIAGNOSIS — M549 Dorsalgia, unspecified: Secondary | ICD-10-CM

## 2011-03-25 DIAGNOSIS — R05 Cough: Secondary | ICD-10-CM

## 2011-03-25 DIAGNOSIS — F329 Major depressive disorder, single episode, unspecified: Secondary | ICD-10-CM

## 2011-03-25 DIAGNOSIS — R61 Generalized hyperhidrosis: Secondary | ICD-10-CM

## 2011-03-25 DIAGNOSIS — K219 Gastro-esophageal reflux disease without esophagitis: Secondary | ICD-10-CM

## 2011-03-25 DIAGNOSIS — G4700 Insomnia, unspecified: Secondary | ICD-10-CM

## 2011-03-25 DIAGNOSIS — G43909 Migraine, unspecified, not intractable, without status migrainosus: Secondary | ICD-10-CM

## 2011-03-25 DIAGNOSIS — G4733 Obstructive sleep apnea (adult) (pediatric): Secondary | ICD-10-CM

## 2011-03-25 DIAGNOSIS — M545 Low back pain: Secondary | ICD-10-CM

## 2011-03-25 DIAGNOSIS — I509 Heart failure, unspecified: Secondary | ICD-10-CM

## 2011-03-25 DIAGNOSIS — E1169 Type 2 diabetes mellitus with other specified complication: Secondary | ICD-10-CM

## 2011-03-25 DIAGNOSIS — Z79899 Other long term (current) drug therapy: Secondary | ICD-10-CM

## 2011-03-25 LAB — BASIC METABOLIC PANEL
CO2: 24 mEq/L (ref 19–32)
Chloride: 103 mEq/L (ref 96–112)
Glucose, Bld: 221 mg/dL — ABNORMAL HIGH (ref 70–99)
Potassium: 4.2 mEq/L (ref 3.5–5.3)
Sodium: 135 mEq/L (ref 135–145)

## 2011-03-25 LAB — GLUCOSE, CAPILLARY: Glucose-Capillary: 208 mg/dL — ABNORMAL HIGH (ref 70–99)

## 2011-03-25 MED ORDER — OMEPRAZOLE 20 MG PO CPDR: 20.0000 mg | DELAYED_RELEASE_CAPSULE | Freq: Every day | ORAL | Status: DC

## 2011-03-25 MED ORDER — ZOLPIDEM TARTRATE 5 MG PO TABS: 5.0000 mg | ORAL_TABLET | Freq: Every evening | ORAL | Status: DC | PRN

## 2011-03-25 MED ORDER — SUMATRIPTAN SUCCINATE 50 MG PO TABS: ORAL_TABLET | ORAL | Status: DC

## 2011-03-25 NOTE — Assessment & Plan Note (Signed)
Patient reports problems with a chronic dry cough that has been present for the past year.  He denies hemoptysis and sputum production.  He also notes significant problems belching with intermittent epigastric pain he has a history of GERD but has not been on any PPI or H2 blocker for many months and also denies sensation of dyspepsia.  He is a nonsmoker.  It is very possible that untreated GERD is the etiology behind his chronic cough as well as intermittent epigastric pain.  Will prescribe omeprazole and have him return to followup on his symptoms.  He is also on therapy with an ACE inhibitor; this also could be causing his cough but will rule out untreated GERD before adjusting any of his other medications.  45 minutes with at least 50% face-to-face time was spent counseling patient about his numerous chronic medical conditions and acute medical concerns, establishing a plan for valuation and continued treatment; time also spent coordinating care with other health care professionals.

## 2011-03-25 NOTE — Assessment & Plan Note (Addendum)
This appears stable; he is without hx or exam findings concerning foracute CHF exacerbation.  His Cr continues to improve and is now 1.35 today, down from 1.71.  Will have him continue his current dictation regimen without any changes.  Probably encouraged him to keep his followup appointment with the [facility] cardiology for interrogation of his ICD and further management of his chronic heart failure.

## 2011-03-25 NOTE — Patient Instructions (Addendum)
Schedule follow up appointment with Dr. Loistine Chance at her next available date. Talk to her about your pain medicines. Ambien is a new medicine to help with your sleep. Use as directed. Do not take trazodone whilel you take take Ambien It is very important to keep your appointments for your sleep study and her appointment with the cardiologist. Taking all of your other medicines as directed. [facility] and mental health resources. We will get an x-ray of your low back to look for evidence of arthritis that might be causing your pain Sumitriptan is a new medicine for migraine headache. Use as directed

## 2011-03-25 NOTE — Assessment & Plan Note (Signed)
Patient continues to have significant difficulty sleeping. He is scheduled for a split-night sleep study next week. I strongly advised him to keep this appointment as it is critical he is appropriately treated for sleep apnea.

## 2011-03-25 NOTE — Assessment & Plan Note (Signed)
Patient reports significant problems with irritability and anger as well as depression. He does not feel that Prozac is helping and states he does not wish to continue this medication. He also reports problems with insomnia. His insomnia is likely multifactorial and the result of his depression and OSA/OHS.  Will write prescription for Ambien to help improve sleep; will discontinue trazodone.  Encouraged him to continue taking Prozac as well as Elavil.  She is willing to see a mental health provider that is more interested in help managing medications than proceeding with counseling/therapy.  His depression is likely compounded by numerous social stressors. Will refer him to [facility] for assistance in finding mental health resources and help with transportation issues, etc.

## 2011-03-25 NOTE — Assessment & Plan Note (Signed)
Patient describes a frequent headache with features consistent with migraine.  He is not currently on any prophylactic medication (although he does take daily Lopressor as part of his CHF and hypertensive regimen) nor does he use any abortive medications.  He has daily headaches but many of these are tension headaches; it seems he suffers from migraine headache 2-4 times per month.  Will prescribe Imitrex.  Educated patient about use of abortive medicines. If the frequency of his migraine headaches increases would have a low threshold for trial of daily migraine prophylaxis.

## 2011-03-25 NOTE — Assessment & Plan Note (Signed)
Patient reports recurrent diaphoresis present over the past 6 months.  He denies weight loss, hemoptysis, bright red blood per rectum, dark tarry stools, fever, chills, or other associated complaint.  HIV was non-reactive in 2012; pt states he is in a monogamous relationship with his wife.  He never smokes and does not have a family hx for colon cancer.  Will start workup by checking a TSH level.

## 2011-03-25 NOTE — Progress Notes (Signed)
Subjective:     Patient ID: [name], male   DOB: 1965/07/28, 46 y.o.   MRN: [id number]  HPI  Pt is a 46 y/o M presenting for HFU.  Pt states prozac is not helping with mood.  He has been taking it for a year and it is still not working.  He states he has tried seroquel, paxil, and celexa with no improvement.  He continues to have significant anxiety and anger.  He is currently not meeting with a mental health specialist.    Insomnia: pt still having difficulty sleeping.  Trazodone is not helping.  He has problems falling asleep and staying asleep.  He states he slept the best while on CPAP at the hospital  CHF: pt was seen in the ER for acute bronchitis and states he was advised to increase fluid intake.  He he reports that despite this his lower extremity swelling is better than it was when he was hospital. He denies increased shortness of breath or chest pain.  Chronic cough: Patient reports chronic cough present for years. He denies productive cough or hemoptysis. He denies fevers, chills, other cold symptoms, or sinus congestion.  He states he has a history of GERD but has not been taking medicine for a while.  Migraine headache. Patient reports a long-standing history of migraine headaches since childhood. He does have associated visual disturbance. He previously took a medicine that dissolved on his tongue but cannot remember the name. He currently does not take any migraine prophylaxis or abortive medicines. Review of Systems  Constitutional: Negative for fever, chills, diaphoresis, activity change, appetite change, fatigue and unexpected weight change.  HENT: Negative for hearing loss, congestion and neck stiffness.   Eyes: Negative for photophobia, pain and visual disturbance.  Respiratory: Negative for cough, chest tightness, and wheezing.   Cardiovascular: Negative for chest pain and palpitations.  Gastrointestinal: Negative for abdominal pain, blood in stool and anal bleeding.    Genitourinary: Negative for dysuria, hematuria and difficulty urinating.  Musculoskeletal: Negative for joint swelling.  Neurological: Negative for dizziness, syncope, speech difficulty, weakness, numbness and headaches.      Objective:   Physical Exam  GEN: No apparent distress.  Alert and oriented x 3.  Pleasant, conversant, and cooperative to exam. HEENT: head is autraumatic and normocephalic.  Neck is supple without palpable masses or lymphadenopathy.  No JVD or carotid bruits.  Vision intact.  EOMI.  PERRLA.  Sclerae anicteric.  Conjunctivae without pallor or injection. Mucous membranes are moist.  Oropharynx is without erythema, exudates, or other abnormal lesions.   RESP:  Lungs are clear to ascultation bilaterally with good air movement.  No wheezes, ronchi, or rubs. CARDIOVASCULAR: regular rate, normal rhythm.  Clear S1, S2, no murmurs, gallops, or rubs. ABDOMEN: soft, non-tender, non-distended.  Bowels sounds present in all quadrants and normoactive.  No palpable masses. EXT: warm and dry.  Peripheral pulses equal, intact, and +2 globally.  No clubbing or cyanosis.  Trace edema in right lower extremities, +1 edema in left lower extremity. SKIN: warm and dry with normal turgor.  No rashes or abnormal lesions observed. NEURO: CN II-XII grossly intact.  Muscle strength +5/5 in bilateral upper and lower extremities.  Sensation is grossly intact.  No focal deficit.  Assessment:

## 2011-03-25 NOTE — Assessment & Plan Note (Signed)
Patient does not have any neurologic deficits on exam or any red flags to suggest underlying infection or neural compression.  He is interested in starting narcotic medications for management of his chronic pain.  I am currently unconvinced of the need to escalate therapy for pain management.  Will obtain a plain film of the L. spine to assess for degenerative changes and underlying arthritis.  Would not repeat MRI at this time as he is not experiencing radiculopathy, bowel/bladder incontinence, bowel/bladder retention, fever, or chills.  Agreed to refill a prescription for tramadol; patient currently has a bottle of 50 mg tablets with a sig of one tab every 6 hr prn.  Will increase to 100 mg by mouth every 6 hr prn.  Advised patient to discuss initiation of narcotics with his primary care provider if he still feels this is his best option.  Would consider referral to pain management for possible targeted injection therapy if he develops symptoms of radiculopathy.

## 2011-03-26 LAB — TSH: TSH: 1.596 u[IU]/mL (ref 0.350–4.500)

## 2011-04-01 ENCOUNTER — Encounter: Payer: Medicare Other | Admitting: Physician Assistant

## 2011-04-05 ENCOUNTER — Other Ambulatory Visit: Payer: Self-pay | Admitting: Internal Medicine

## 2011-04-05 DIAGNOSIS — F329 Major depressive disorder, single episode, unspecified: Secondary | ICD-10-CM

## 2011-04-06 ENCOUNTER — Telehealth: Payer: Self-pay | Admitting: *Deleted

## 2011-04-06 NOTE — Telephone Encounter (Signed)
SPOKE WITH [name], INFORMED HIM THAT I WAS PUTTING [facility] INFO IN THE MAIL TO HIM, HIS DR, WANTED HIM TO BE SEEN FOR HIS DEPRESSION. PATIENT WAS INSTRUCTED TO CALL THE NUMBER ON THE PAPER TO SET UP APPT WITH [facility].  [name] NTII  [phone]  10:36AM

## 2011-04-07 ENCOUNTER — Ambulatory Visit (INDEPENDENT_AMBULATORY_CARE_PROVIDER_SITE_OTHER): Payer: Medicare Other | Admitting: *Deleted

## 2011-04-07 ENCOUNTER — Encounter (HOSPITAL_COMMUNITY): Payer: Self-pay | Admitting: General Practice

## 2011-04-07 ENCOUNTER — Encounter: Payer: Self-pay | Admitting: Physician Assistant

## 2011-04-07 ENCOUNTER — Encounter: Payer: Self-pay | Admitting: Internal Medicine

## 2011-04-07 ENCOUNTER — Inpatient Hospital Stay (HOSPITAL_COMMUNITY)
Admission: AD | Admit: 2011-04-07 | Discharge: 2011-04-20 | DRG: 286 | Disposition: A | Discharge status: Home / Self Care | Payer: Medicare Other | Source: Ambulatory Visit | Attending: Internal Medicine | Admitting: Internal Medicine

## 2011-04-07 ENCOUNTER — Ambulatory Visit (INDEPENDENT_AMBULATORY_CARE_PROVIDER_SITE_OTHER): Payer: Medicare Other | Admitting: Physician Assistant

## 2011-04-07 ENCOUNTER — Encounter: Payer: Self-pay | Admitting: *Deleted

## 2011-04-07 VITALS — BP 122/84 | HR 97 | Ht 72.0 in | Wt 246.0 lb

## 2011-04-07 DIAGNOSIS — I509 Heart failure, unspecified: Secondary | ICD-10-CM

## 2011-04-07 DIAGNOSIS — Z833 Family history of diabetes mellitus: Secondary | ICD-10-CM

## 2011-04-07 DIAGNOSIS — E785 Hyperlipidemia, unspecified: Secondary | ICD-10-CM | POA: Diagnosis present

## 2011-04-07 DIAGNOSIS — N189 Chronic kidney disease, unspecified: Secondary | ICD-10-CM | POA: Diagnosis present

## 2011-04-07 DIAGNOSIS — R05 Cough: Secondary | ICD-10-CM

## 2011-04-07 DIAGNOSIS — E119 Type 2 diabetes mellitus without complications: Secondary | ICD-10-CM

## 2011-04-07 DIAGNOSIS — N289 Disorder of kidney and ureter, unspecified: Secondary | ICD-10-CM | POA: Diagnosis present

## 2011-04-07 DIAGNOSIS — I10 Essential (primary) hypertension: Secondary | ICD-10-CM

## 2011-04-07 DIAGNOSIS — J9600 Acute respiratory failure, unspecified whether with hypoxia or hypercapnia: Secondary | ICD-10-CM | POA: Diagnosis present

## 2011-04-07 DIAGNOSIS — Z95810 Presence of automatic (implantable) cardiac defibrillator: Secondary | ICD-10-CM

## 2011-04-07 DIAGNOSIS — Z87891 Personal history of nicotine dependence: Secondary | ICD-10-CM

## 2011-04-07 DIAGNOSIS — I5023 Acute on chronic systolic (congestive) heart failure: Secondary | ICD-10-CM

## 2011-04-07 DIAGNOSIS — G4733 Obstructive sleep apnea (adult) (pediatric): Secondary | ICD-10-CM | POA: Diagnosis present

## 2011-04-07 DIAGNOSIS — G4700 Insomnia, unspecified: Secondary | ICD-10-CM

## 2011-04-07 DIAGNOSIS — I129 Hypertensive chronic kidney disease with stage 1 through stage 4 chronic kidney disease, or unspecified chronic kidney disease: Secondary | ICD-10-CM | POA: Diagnosis present

## 2011-04-07 DIAGNOSIS — I428 Other cardiomyopathies: Secondary | ICD-10-CM

## 2011-04-07 DIAGNOSIS — E875 Hyperkalemia: Secondary | ICD-10-CM | POA: Diagnosis present

## 2011-04-07 DIAGNOSIS — R570 Cardiogenic shock: Secondary | ICD-10-CM | POA: Diagnosis not present

## 2011-04-07 DIAGNOSIS — K219 Gastro-esophageal reflux disease without esophagitis: Secondary | ICD-10-CM

## 2011-04-07 DIAGNOSIS — N179 Acute kidney failure, unspecified: Secondary | ICD-10-CM | POA: Diagnosis present

## 2011-04-07 DIAGNOSIS — E871 Hypo-osmolality and hyponatremia: Secondary | ICD-10-CM | POA: Diagnosis present

## 2011-04-07 HISTORY — DX: Gastro-esophageal reflux disease without esophagitis: K21.9

## 2011-04-07 HISTORY — DX: Presence of automatic (implantable) cardiac defibrillator: Z95.810

## 2011-04-07 LAB — GLUCOSE, CAPILLARY
Glucose-Capillary: 144 mg/dL — ABNORMAL HIGH (ref 70–99)
Glucose-Capillary: 218 mg/dL — ABNORMAL HIGH (ref 70–99)

## 2011-04-07 LAB — DIFFERENTIAL
Eosinophils Absolute: 0.2 10*3/uL (ref 0.0–0.7)
Eosinophils Relative: 4 % (ref 0–5)
Lymphocytes Relative: 29 % (ref 12–46)
Lymphs Abs: 1.5 10*3/uL (ref 0.7–4.0)
Monocytes Absolute: 0.3 10*3/uL (ref 0.1–1.0)
Monocytes Relative: 6 % (ref 3–12)

## 2011-04-07 LAB — COMPREHENSIVE METABOLIC PANEL
BUN: 19 mg/dL (ref 6–23)
CO2: 22 mEq/L (ref 19–32)
Calcium: 8.9 mg/dL (ref 8.4–10.5)
Creatinine, Ser: 1.29 mg/dL (ref 0.50–1.35)
GFR calc Af Amer: 77 mL/min — ABNORMAL LOW (ref >90)
GFR calc non Af Amer: 67 mL/min — ABNORMAL LOW (ref >90)
Glucose, Bld: 216 mg/dL — ABNORMAL HIGH (ref 70–99)
Sodium: 137 mEq/L (ref 135–145)
Total Protein: 6.7 g/dL (ref 6.0–8.3)

## 2011-04-07 LAB — CBC
HCT: 41.6 % (ref 39.0–52.0)
MCH: 29.4 pg (ref 26.0–34.0)
MCV: 86.8 fL (ref 78.0–100.0)
Platelets: 222 10*3/uL (ref 150–400)
RBC: 4.79 MIL/uL (ref 4.22–5.81)

## 2011-04-07 LAB — PROTIME-INR
INR: 1.32 (ref 0.00–1.49)
Prothrombin Time: 16.6 seconds — ABNORMAL HIGH (ref 11.6–15.2)

## 2011-04-07 LAB — ICD DEVICE OBSERVATION
BATTERY VOLTAGE: 2.993 V
BRDY-0004RV: 110 {beats}/min
FVT: 0
HV IMPEDENCE: 41 Ohm
PACEART VT: 0
RV LEAD THRESHOLD: 0.75 V
VF: 0

## 2011-04-07 LAB — CARDIAC PANEL(CRET KIN+CKTOT+MB+TROPI)
Relative Index: 1.2 (ref 0.0–2.5)
Relative Index: 1.2 (ref 0.0–2.5)
Total CK: 311 U/L — ABNORMAL HIGH (ref 7–232)
Troponin I: <0.3 ng/mL (ref <0.30)

## 2011-04-07 LAB — APTT: aPTT: 30 seconds (ref 24–37)

## 2011-04-07 MED ORDER — SUMATRIPTAN SUCCINATE 50 MG PO TABS
50.0000 mg | ORAL_TABLET | Freq: Once | ORAL | Status: AC
  Administered 2011-04-07: 50 mg via ORAL
  Filled 2011-04-07: qty 1

## 2011-04-07 MED ORDER — GABAPENTIN 300 MG PO CAPS
300.0000 mg | ORAL_CAPSULE | Freq: Three times a day (TID) | ORAL | Status: DC
  Administered 2011-04-07 – 2011-04-20 (×37): 300 mg via ORAL
  Filled 2011-04-07 (×41): qty 1

## 2011-04-07 MED ORDER — ZOLPIDEM TARTRATE 5 MG PO TABS
5.0000 mg | ORAL_TABLET | Freq: Every evening | ORAL | Status: DC | PRN
Start: 2011-04-07 — End: 2011-04-20
  Administered 2011-04-08 – 2011-04-20 (×13): 5 mg via ORAL
  Filled 2011-04-07 (×13): qty 1

## 2011-04-07 MED ORDER — INSULIN ASPART 100 UNIT/ML ~~LOC~~ SOLN
0.0000 [IU] | Freq: Three times a day (TID) | SUBCUTANEOUS | Status: DC
  Administered 2011-04-07: 7 [IU] via SUBCUTANEOUS
  Administered 2011-04-08: 4 [IU] via SUBCUTANEOUS
  Administered 2011-04-08: 3 [IU] via SUBCUTANEOUS
  Administered 2011-04-09: 4 [IU] via SUBCUTANEOUS
  Administered 2011-04-09: 3 [IU] via SUBCUTANEOUS
  Administered 2011-04-10 – 2011-04-12 (×2): 4 [IU] via SUBCUTANEOUS

## 2011-04-07 MED ORDER — TERAZOSIN HCL 1 MG PO CAPS
1.0000 mg | ORAL_CAPSULE | Freq: Every day | ORAL | Status: DC
  Administered 2011-04-07 – 2011-04-19 (×13): 1 mg via ORAL
  Filled 2011-04-07 (×14): qty 1

## 2011-04-07 MED ORDER — INSULIN GLARGINE 100 UNIT/ML ~~LOC~~ SOLN
15.0000 [IU] | Freq: Every day | SUBCUTANEOUS | Status: DC
  Administered 2011-04-07 – 2011-04-19 (×12): 15 [IU] via SUBCUTANEOUS

## 2011-04-07 MED ORDER — ASPIRIN EC 81 MG PO TBEC
81.0000 mg | DELAYED_RELEASE_TABLET | Freq: Every day | ORAL | Status: DC
  Administered 2011-04-07 – 2011-04-20 (×14): 81 mg via ORAL
  Filled 2011-04-07 (×14): qty 1

## 2011-04-07 MED ORDER — SPIRONOLACTONE 25 MG PO TABS
25.0000 mg | ORAL_TABLET | Freq: Every day | ORAL | Status: DC
  Administered 2011-04-07 – 2011-04-15 (×9): 25 mg via ORAL
  Filled 2011-04-07 (×9): qty 1

## 2011-04-07 MED ORDER — FUROSEMIDE 10 MG/ML IJ SOLN
80.0000 mg | Freq: Two times a day (BID) | INTRAMUSCULAR | Status: DC
  Administered 2011-04-07 – 2011-04-08 (×3): 80 mg via INTRAVENOUS
  Filled 2011-04-07 (×8): qty 8

## 2011-04-07 MED ORDER — FLUOXETINE HCL 20 MG PO CAPS
40.0000 mg | ORAL_CAPSULE | Freq: Every day | ORAL | Status: DC
  Administered 2011-04-07 – 2011-04-20 (×14): 40 mg via ORAL
  Filled 2011-04-07 (×14): qty 2

## 2011-04-07 MED ORDER — ONDANSETRON HCL 4 MG/2ML IJ SOLN: 4.0000 mg | Freq: Four times a day (QID) | INTRAMUSCULAR | Status: DC | PRN

## 2011-04-07 MED ORDER — SODIUM CHLORIDE 0.9 % IV SOLN: 250.0000 mL | INTRAVENOUS | Status: DC | PRN

## 2011-04-07 MED ORDER — GLIPIZIDE 10 MG PO TABS
10.0000 mg | ORAL_TABLET | Freq: Every day | ORAL | Status: DC
  Administered 2011-04-08 – 2011-04-12 (×5): 10 mg via ORAL
  Filled 2011-04-07 (×8): qty 1

## 2011-04-07 MED ORDER — SODIUM CHLORIDE 0.9 % IJ SOLN: 3.0000 mL | INTRAMUSCULAR | Status: DC | PRN

## 2011-04-07 MED ORDER — AMITRIPTYLINE HCL 10 MG PO TABS
10.0000 mg | ORAL_TABLET | Freq: Every day | ORAL | Status: DC
  Administered 2011-04-07 – 2011-04-19 (×13): 10 mg via ORAL
  Filled 2011-04-07 (×14): qty 1

## 2011-04-07 MED ORDER — PANTOPRAZOLE SODIUM 40 MG PO TBEC
40.0000 mg | DELAYED_RELEASE_TABLET | Freq: Every day | ORAL | Status: DC
  Administered 2011-04-07 – 2011-04-19 (×13): 40 mg via ORAL
  Filled 2011-04-07 (×14): qty 1

## 2011-04-07 MED ORDER — SODIUM CHLORIDE 0.9 % IJ SOLN
3.0000 mL | Freq: Two times a day (BID) | INTRAMUSCULAR | Status: DC
  Administered 2011-04-07 – 2011-04-19 (×19): 3 mL via INTRAVENOUS

## 2011-04-07 MED ORDER — POTASSIUM CHLORIDE CRYS ER 20 MEQ PO TBCR
20.0000 meq | EXTENDED_RELEASE_TABLET | Freq: Every day | ORAL | Status: DC
  Administered 2011-04-07 – 2011-04-13 (×7): 20 meq via ORAL
  Filled 2011-04-07 (×8): qty 1

## 2011-04-07 MED ORDER — CARVEDILOL 6.25 MG PO TABS
6.2500 mg | ORAL_TABLET | Freq: Two times a day (BID) | ORAL | Status: DC
  Administered 2011-04-07 – 2011-04-12 (×11): 6.25 mg via ORAL
  Filled 2011-04-07 (×13): qty 1

## 2011-04-07 MED ORDER — FLUOXETINE HCL 40 MG PO CAPS: 40.0000 mg | ORAL_CAPSULE | Freq: Every day | ORAL | Status: DC

## 2011-04-07 MED ORDER — ATORVASTATIN CALCIUM 20 MG PO TABS
20.0000 mg | ORAL_TABLET | Freq: Every day | ORAL | Status: DC
  Administered 2011-04-07 – 2011-04-19 (×12): 20 mg via ORAL
  Filled 2011-04-07 (×14): qty 1

## 2011-04-07 MED ORDER — INSULIN ASPART 100 UNIT/ML ~~LOC~~ SOLN
0.0000 [IU] | Freq: Every day | SUBCUTANEOUS | Status: DC
  Administered 2011-04-12: 3 [IU] via SUBCUTANEOUS
  Administered 2011-04-17: 2 [IU] via SUBCUTANEOUS

## 2011-04-07 MED ORDER — ACETAMINOPHEN 325 MG PO TABS
650.0000 mg | ORAL_TABLET | ORAL | Status: DC | PRN
  Administered 2011-04-07 – 2011-04-14 (×2): 650 mg via ORAL
  Filled 2011-04-07 (×2): qty 2

## 2011-04-07 MED ORDER — ENOXAPARIN SODIUM 40 MG/0.4ML ~~LOC~~ SOLN
40.0000 mg | SUBCUTANEOUS | Status: DC
  Administered 2011-04-07 – 2011-04-12 (×6): 40 mg via SUBCUTANEOUS
  Filled 2011-04-07 (×7): qty 0.4

## 2011-04-07 MED ORDER — BENAZEPRIL HCL 40 MG PO TABS
40.0000 mg | ORAL_TABLET | Freq: Every day | ORAL | Status: DC
  Administered 2011-04-08 – 2011-04-11 (×4): 40 mg via ORAL
  Filled 2011-04-07 (×5): qty 1

## 2011-04-07 NOTE — Progress Notes (Signed)
[address] [place], [place]  [address] Phone: [phone] Fax:  [phone]  Date:  04/07/2011   Name:  [name]       DOB:  08-10-1965 MRN:  [id number]  PCP:  Dr. Loistine Chance  Primary Cardiologist:  Dr. Olga Millers  Primary Electrophysiologist:  Dr. Lewayne Bunting    History of Present Illness: [name] is a 46 y.o. male who presents for post hospital follow up.    He has a history of nonischemic cardiomyopathy, systolic heart failure, status post AICD.  He was last seen by Dr. Jens Som in 2009 Dr. Ladona Ridgel in 2010.  Of note, LHC 11/08: Proximal to MID AV circumflex 40% (question anatomical).  Other history includes diabetes, hypertension, hyperlipidemia and prior alcohol abuse.    He was admitted 2/25-2/28 with acute on chronic systolic congestive heart failure.  He was diuresed with IV Lasix.  His creatinine did increase 1.4-1.77.  It was noted that he was noncompliant with CPAP therapy.  He was referred for outpatient sleep study and CPAP titration.  He was also started on Terazosin for probable BPH.  Echocardiogram 03/16/11: Severe LVE, EF 20%, diffuse hypokinesis, mild LAE, mild RVE, mild RAE, PASP 64.    He continues to feel significantly volume overloaded.  He does not weigh himself.  He feels his LE edema is worse since discharge. States he used to take Lasix 160 mg in the AM and 80 mg in the PM previously.  Now, only taking Lasix 80 mg BID.  Notes 5 pillow orthopnea.  Has a nonproductive cough.  Has been treated for bronchitis in the recent past.  He notes PND.  No syncope or chest pain.  Of note, he admits to dietary indiscretion with salt.  He is compliant with medications.     Past Medical History  Diagnosis Date  . Hypertension   . Diabetes mellitus   . 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 History of peptic ulcer disease     EGD 09/2007  shows prox gastric ulcer with black eschar, treated with PPI  . Depression   . History of alcohol abuse     quit approx 2011-2012  . Angina   . Shortness of breath   . OSA (obstructive sleep apnea)     sleeps with oxygen    Current Outpatient Prescriptions  Medication Sig Dispense Refill  . amitriptyline (ELAVIL) 10 MG tablet Take 10 mg by mouth at bedtime.      . benazepril (LOTENSIN) 40 MG tablet Take 40 mg by mouth daily.      Marland Kitchen FLUoxetine (PROZAC) 40 MG capsule Take 40 mg by mouth daily.      . furosemide (LASIX) 80 MG tablet Take 1 tablet (80 mg total) by mouth 2 (two) times daily.  60 tablet  1  . gabapentin (NEURONTIN) 300 MG capsule Take 300 mg by mouth 3 (three) times daily.      Marland Kitchen glipiZIDE (GLUCOTROL) 10 MG tablet Take 10 mg by mouth daily with breakfast.       . insulin glargine (LANTUS) 100 UNIT/ML injection Inject 15 Units into the skin at bedtime.  10 mL  0  . metoprolol tartrate (LOPRESSOR) 25 MG tablet Take 1 tablet (25 mg total) by mouth 2 (two) times daily.  60 tablet  1  . omeprazole (PRILOSEC) 20 MG capsule Take  1 capsule (20 mg total) by mouth daily.  30 capsule  1  . rosuvastatin (CRESTOR) 10 MG tablet Take 10 mg by mouth daily.      Marland Kitchen spironolactone (ALDACTONE) 25 MG tablet Take 25 mg by mouth daily.      . SUMAtriptan (IMITREX) 50 MG tablet Take one tablet at the first symptom of a migraine headache.  may repeat a second dose in 1hr if headache persists.  10 tablet  0  . terazosin (HYTRIN) 1 MG capsule Take 1 capsule (1 mg total) by mouth at bedtime.  30 capsule  0  . zolpidem (AMBIEN) 5 MG tablet Take 1 tablet (5 mg total) by mouth at bedtime as needed for sleep.  30 tablet  0  . DISCONTD: insulin glargine (LANTUS SOLOSTAR) 100 UNIT/ML injection Inject 20 Units into the skin at bedtime.  10 mL  12    Allergies: Allergies  Allergen Reactions  . Shellfish Allergy Anaphylaxis    History  Substance Use Topics  . Smoking status: Former Smoker    Types:  Cigarettes, Cigars    Quit date: 07/27/2007  . Smokeless tobacco: Never Used  . Alcohol Use: No     no alcohol since 2012     Family History  Problem Relation Age of Onset  . Heart failure Mother   . Hypertension Mother   . Sarcoidosis Mother   . Diabetes type II Mother   . Heart attack Brother      ROS:  Please see the history of present illness.   He is scheduled to have a sleep study in a few day.  All other systems reviewed and negative.   PHYSICAL EXAM: VS:  BP 122/84  Pulse 97  Ht 6' (1.829 m)  Wt 246 lb (111.585 kg)  BMI 33.36 kg/m2  Well nourished, well developed, in no acute distress HEENT: normal Neck: JVP up to the angle of his jaw Endo: no thyromegaly  Cardiac:  normal S1, S2; RRR; no murmur, no gallop Lungs:  clear to auscultation bilaterally, no wheezing, rhonchi or rales Abd: distended, no hepatomeglay Ext: 2+ bilateral edema Skin: warm and dry Neuro:  CNs 2-12 intact, no focal abnormalities noted Psych: normal affect   EKG:  Sinus rhythm, heart rate 97, normal axis, prolonged QT (QTC 495 ms axis  Lab Results  Component Value Date   CREATININE 1.35 03/25/2011   BUN 18 03/25/2011   NA 135 03/25/2011   K 4.2 03/25/2011   CL 103 03/25/2011   CO2 24 03/25/2011     Lab Results  Component Value Date   WBC 8.2 03/18/2011   HGB 12.9* 03/18/2011   HCT 39.4 03/18/2011   MCV 88.7 03/18/2011   PLT 239 03/18/2011     Lab Results  Component Value Date   TSH 1.596 03/25/2011     ASSESSMENT AND PLAN:  1. Acute on chronic systolic heart failure  He is massively volume overloaded.  He does not weigh himself at home.  He has a diet high in salt and we discussed the importance of limiting his salt.  I think he would benefit from seeing the [facility] clinic.  His symptoms are progressing and he really has Class 3b-4 symptoms.  I think he would benefit from admission to the hospital and IV diuresis.  He is in agreement with this and seems to be relieved by this  recommendation.  I will have the CHF service see him in the hospital as well and  plan on him following with them after discharge.  He will be initially be placed on Lasix 80 mg IV bid.  Will continue other meds.  Change metoprolol to carvedilol.  He states he already takes this at home.  Add K+ 20 mEq bid and follow renal fxn and K+ closely.  Use Lovenox for DVT prophylaxis.     2. HLD (hyperlipidemia)  Continue Crestor.   3. HTN (hypertension)  Controlled.   4. DM2 (diabetes mellitus, type 2)  Continue current regimen and cover with SSI.    5. SLEEP APNEA, OBSTRUCTIVE, MILD  Will need to either reschedule sleep test or do while in house (if possible).     Signed, Tereso Newcomer, PA-C  12:20 PM 04/07/2011

## 2011-04-07 NOTE — Progress Notes (Signed)
Ward Givens, Np notified 68f 3 beats vtach. No new order. Pt asymtomatic.

## 2011-04-07 NOTE — H&P (Signed)
Admission History and Physical   Date: 04/07/2011   Name: [name]  DOB: 1965-08-05  MRN: [id number]   PCP: Dr. Loistine Chance  Primary Cardiologist: Dr. Olga Millers  Primary Electrophysiologist: Dr. Lewayne Bunting   History of Present Illness:  [name] is a 46 y.o. male who presents for post hospital follow up.   He has a history of nonischemic cardiomyopathy, systolic heart failure, status post AICD. He was last seen by Dr. Jens Som in 2009 Dr. Ladona Ridgel in 2010. Of note, LHC 11/08: Proximal to MID AV circumflex 40% (question anatomical). Other history includes diabetes, hypertension, hyperlipidemia and prior alcohol abuse.   He was admitted 2/25-2/28 with acute on chronic systolic congestive heart failure. He was diuresed with IV Lasix. His creatinine did increase 1.4-1.77. It was noted that he was noncompliant with CPAP therapy. He was referred for outpatient sleep study and CPAP titration. He was also started on Terazosin for probable BPH. Echocardiogram 03/16/11: Severe LVE, EF 20%, diffuse hypokinesis, mild LAE, mild RVE, mild RAE, PASP 64.   He continues to feel significantly volume overloaded. He does not weigh himself. He feels his LE edema is worse since discharge. States he used to take Lasix 160 mg in the AM and 80 mg in the PM previously. Now, only taking Lasix 80 mg BID. Notes 5 pillow orthopnea. Has a nonproductive cough. Has been treated for bronchitis in the recent past. He notes PND. No syncope or chest pain. Of note, he admits to dietary indiscretion with salt. He is compliant with medications.    Past Medical History   Diagnosis  Date   .  Hypertension    .  Diabetes mellitus    .  CHF (congestive heart failure)      Dilated NICM, likely secondary to prior heavy alcohol usage, EF 20-25%, s/p AICD placement 04/2007   .  History of peptic ulcer disease      EGD 09/2007 shows prox gastric ulcer with black eschar, treated with PPI   .  Depression    .  History of alcohol  abuse      quit approx 2011-2012   .  Angina    .  Shortness of breath    .  OSA (obstructive sleep apnea)      sleeps with oxygen     Current Outpatient Prescriptions   Medication  Sig  Dispense  Refill   .  amitriptyline (ELAVIL) 10 MG tablet  Take 10 mg by mouth at bedtime.     .  benazepril (LOTENSIN) 40 MG tablet  Take 40 mg by mouth daily.     Marland Kitchen  FLUoxetine (PROZAC) 40 MG capsule  Take 40 mg by mouth daily.     .  furosemide (LASIX) 80 MG tablet  Take 1 tablet (80 mg total) by mouth 2 (two) times daily.  60 tablet  1   .  gabapentin (NEURONTIN) 300 MG capsule  Take 300 mg by mouth 3 (three) times daily.     Marland Kitchen  glipiZIDE (GLUCOTROL) 10 MG tablet  Take 10 mg by mouth daily with breakfast.     .  insulin glargine (LANTUS) 100 UNIT/ML injection  Inject 15 Units into the skin at bedtime.  10 mL  0   .  metoprolol tartrate (LOPRESSOR) 25 MG tablet  Take 1 tablet (25 mg total) by mouth 2 (two) times daily.  60 tablet  1   .  omeprazole (PRILOSEC) 20 MG capsule  Take 1  capsule (20 mg total) by mouth daily.  30 capsule  1   .  rosuvastatin (CRESTOR) 10 MG tablet  Take 10 mg by mouth daily.     Marland Kitchen  spironolactone (ALDACTONE) 25 MG tablet  Take 25 mg by mouth daily.     .  SUMAtriptan (IMITREX) 50 MG tablet  Take one tablet at the first symptom of a migraine headache. may repeat a second dose in 1hr if headache persists.  10 tablet  0   .  terazosin (HYTRIN) 1 MG capsule  Take 1 capsule (1 mg total) by mouth at bedtime.  30 capsule  0   .  zolpidem (AMBIEN) 5 MG tablet  Take 1 tablet (5 mg total) by mouth at bedtime as needed for sleep.  30 tablet  0   .  DISCONTD: insulin glargine (LANTUS SOLOSTAR) 100 UNIT/ML injection  Inject 20 Units into the skin at bedtime.  10 mL  12     Allergies:  Allergies   Allergen  Reactions   .  Shellfish Allergy  Anaphylaxis     History   Substance Use Topics   .  Smoking status:  Former Smoker     Types:  Cigarettes, Cigars     Quit date:  07/27/2007     .  Smokeless tobacco:  Never Used   .  Alcohol Use:  No      no alcohol since 2012     Family History   Problem  Relation  Age of Onset   .  Heart failure  Mother    .  Hypertension  Mother    .  Sarcoidosis  Mother    .  Diabetes type II  Mother    .  Heart attack  Brother      ROS: Please see the history of present illness. He is scheduled to have a sleep study in a few day. All other systems reviewed and negative.   PHYSICAL EXAM:  VS: BP 122/84  Pulse 97  Ht 6' (1.829 m)  Wt 246 lb (111.585 kg)  BMI 33.36 kg/m2   Well nourished, well developed, in no acute distress  HEENT: normal  Neck: JVP up to the angle of his jaw  Endo: no thyromegaly  Cardiac: normal S1, S2; RRR; no murmur, no gallop  Lungs: clear to auscultation bilaterally, no wheezing, rhonchi or rales  Abd: distended, no hepatomeglay  Ext: 2+ bilateral edema  Skin: warm and dry  Neuro: CNs 2-12 intact, no focal abnormalities noted  Psych: normal affect   EKG: Sinus rhythm, heart rate 97, normal axis, prolonged QT (QTC 495 ms axis    Lab Results   Component  Value  Date    CREATININE  1.35  03/25/2011    BUN  18  03/25/2011    NA  135  03/25/2011    K  4.2  03/25/2011    CL  103  03/25/2011    CO2  24  03/25/2011     Lab Results   Component  Value  Date    WBC  8.2  03/18/2011    HGB  12.9*  03/18/2011    HCT  39.4  03/18/2011    MCV  88.7  03/18/2011    PLT  239  03/18/2011     Lab Results   Component  Value  Date    TSH  1.596  03/25/2011     ASSESSMENT AND PLAN:    1.  Acute on chronic systolic heart failure  He is massively volume overloaded. He does not weigh himself at home. He has a diet high in salt and we discussed the importance of limiting his salt. I think he would benefit from seeing the [facility] clinic. His symptoms are progressing and he really has Class 3b-4 symptoms. I think he would benefit from admission to the hospital and IV diuresis. He is in agreement with this and seems to be  relieved by this recommendation. I will have the CHF service see him in the hospital as well and plan on him following with them after discharge. He will be initially be placed on Lasix 80 mg IV bid. Will continue other meds. Change metoprolol to carvedilol. He states he already takes this at home. Add K+ 20 mEq bid and follow renal fxn and K+ closely. Use Lovenox for DVT prophylaxis.    2.  HLD (hyperlipidemia)  Continue Crestor.    3.  HTN (hypertension)  Controlled.    4.  DM2 (diabetes mellitus, type 2)  Continue current regimen and cover with SSI.    5.  SLEEP APNEA, OBSTRUCTIVE, MILD  Will need to either reschedule sleep test or do while in house (if possible).     Signed,  Tereso Newcomer, PA-C 12:20 PM 04/07/2011

## 2011-04-07 NOTE — Progress Notes (Signed)
ICD check 

## 2011-04-07 NOTE — Progress Notes (Signed)
Pt admitted to 4714 from clinic. Pt A&O. Edema to BLE. Skin intact. Oriented pt to room and placed call bell in reach. Pt denied pain or concerns. Will continue to monitor pt closely and await MD orders.

## 2011-04-08 ENCOUNTER — Inpatient Hospital Stay (HOSPITAL_COMMUNITY): Payer: Medicare Other

## 2011-04-08 ENCOUNTER — Other Ambulatory Visit: Payer: Self-pay

## 2011-04-08 LAB — BASIC METABOLIC PANEL
CO2: 23 mEq/L (ref 19–32)
Chloride: 105 mEq/L (ref 96–112)
Creatinine, Ser: 1.35 mg/dL (ref 0.50–1.35)
GFR calc Af Amer: 73 mL/min — ABNORMAL LOW (ref >90)
Sodium: 138 mEq/L (ref 135–145)

## 2011-04-08 LAB — CARDIAC PANEL(CRET KIN+CKTOT+MB+TROPI)
CK, MB: 2.9 ng/mL (ref 0.3–4.0)
Relative Index: 1.5 (ref 0.0–2.5)
Total CK: 199 U/L (ref 7–232)

## 2011-04-08 LAB — GLUCOSE, CAPILLARY
Glucose-Capillary: 116 mg/dL — ABNORMAL HIGH (ref 70–99)
Glucose-Capillary: 97 mg/dL (ref 70–99)
Glucose-Capillary: 137 mg/dL — ABNORMAL HIGH (ref 70–99)

## 2011-04-08 MED ORDER — HYDROCODONE-ACETAMINOPHEN 5-325 MG PO TABS
1.0000 | ORAL_TABLET | Freq: Four times a day (QID) | ORAL | Status: DC | PRN
  Administered 2011-04-08 (×3): 1 via ORAL
  Administered 2011-04-09 – 2011-04-18 (×20): 2 via ORAL
  Administered 2011-04-19: 1 via ORAL
  Administered 2011-04-19: 2 via ORAL
  Administered 2011-04-19: 1 via ORAL
  Administered 2011-04-20: 2 via ORAL
  Filled 2011-04-08 (×2): qty 2
  Filled 2011-04-08 (×2): qty 1
  Filled 2011-04-08 (×3): qty 2
  Filled 2011-04-08: qty 1
  Filled 2011-04-08 (×3): qty 2
  Filled 2011-04-08 (×2): qty 1
  Filled 2011-04-08 (×5): qty 2
  Filled 2011-04-08: qty 1
  Filled 2011-04-08 (×5): qty 2
  Filled 2011-04-08: qty 1
  Filled 2011-04-08 (×2): qty 2
  Filled 2011-04-08: qty 1
  Filled 2011-04-08: qty 2
  Filled 2011-04-08: qty 1

## 2011-04-08 NOTE — Plan of Care (Signed)
Problem: Food- and Nutrition-Related Knowledge Deficit (NB-1.1) Goal: Nutrition education Formal process to instruct or train a patient/client in a skill or to impart knowledge to help patients/clients voluntarily manage or modify food choices and eating behavior to maintain or improve health.  Outcome: Completed/Met Date Met:  04/08/11 RD asked to speak with patient by RN in HF rounds regarding patients non-compliance with diet and weighing daily. Patient was very agitated to begin with stating that "the rules keep changing." Reports that last admission he was allowed to eat bacon and sausage, and now he is not. RD clarified diet order with patient and explained that total daily sodium intake needs to be controlled. Patient continued to be upset regarding this admission and his d/c from previous admission, stating that he took his meds and this was not his fault. RD encouraged patient to continue to monitor sodium intake and explained the impact on his overall health. RD also encouraged patient to weigh himself daily, pt stated he is to busy and that he can tell when he is gaining fluid. RD explained the concept of weight daily and notifying his physician when he gains to avoid having to be admitted to the hospital. Pt stated he would try to weigh more often.  Pt was also upset that he was always hungry between meals and we are not feeding him enough, RD agreed to add snacks between meals and pt was pacified. At end of discussion patient was more calm and apologized for being rude. RD encouraged pt to inform RN if RD could be of assistance in the future. RD expects somewhat better compliance with diet and weights after this discussion. RD will add snacks, no other nutrition interventions at this time.   [name] [phone]

## 2011-04-08 NOTE — Progress Notes (Signed)
@   Subjective:  Denies CP or dyspnea   Objective:  Filed Vitals:   04/07/11 2150 04/07/11 2238 04/08/11 0309 04/08/11 0558  BP: 129/91 131/92 117/87 119/85  Pulse: 82 87 87 84  Temp: 97.4 F (36.3 C) 97.5 F (36.4 C) 97.8 F (36.6 C) 98 F (36.7 C)  TempSrc: Oral Oral Oral Oral  Resp: 18 20 20 19   Height:      Weight:    242 lb (109.77 kg)  SpO2: 97% 99% 100% 100%    Intake/Output from previous day:  Intake/Output Summary (Last 24 hours) at 04/08/11 0757 Last data filed at 04/08/11 0603  Gross per 24 hour  Intake    488 ml  Output   1450 ml  Net   -962 ml    Physical Exam: Physical exam: Well-developed well-nourished in no acute distress.  Skin is warm and dry.  HEENT is normal.  Neck is supple. No thyromegaly.  Chest with diminished BS bases Cardiovascular exam is regular rate and rhythm.  Abdominal exam nontender or distended. No masses palpated. Patient with abdominal wall edema Extremities show 2+ edema. neuro grossly intact    Lab Results: Basic Metabolic Panel:  Basename 04/08/11 0552 04/07/11 1517  NA 138 137  K 3.8 3.8  CL 105 103  CO2 23 22  GLUCOSE 138* 216*  BUN 21 19  CREATININE 1.35 1.29  CALCIUM 8.6 8.9  MG -- --  PHOS -- --   CBC:  Basename 04/07/11 1517  WBC 5.1  NEUTROABS 3.1  HGB 14.1  HCT 41.6  MCV 86.8  PLT 222   Cardiac Enzymes:  Basename 04/08/11 0552 04/07/11 2150 04/07/11 1517  CKTOTAL 199 290* 311*  CKMB 2.9 3.5 3.6  CKMBINDEX -- -- --  TROPONINI <0.30 <0.30 <0.30     Assessment/Plan:  1) CHF - Continue diuresis and follow renal function. Would benefit from CHF clinic. 2) NICM - Continue ACEI; continue coreg and advance when CHF improves. 3) S/P ICD 4) Hypertension - follow BP and adjust meds as needed. 5) DM 6) OSA  Olga Millers 04/08/2011, 7:57 AM

## 2011-04-09 LAB — BASIC METABOLIC PANEL
Calcium: 8.6 mg/dL (ref 8.4–10.5)
Creatinine, Ser: 1.38 mg/dL — ABNORMAL HIGH (ref 0.50–1.35)
GFR calc Af Amer: 71 mL/min — ABNORMAL LOW (ref >90)
GFR calc non Af Amer: 61 mL/min — ABNORMAL LOW (ref >90)

## 2011-04-09 MED ORDER — FUROSEMIDE 10 MG/ML IJ SOLN
160.0000 mg | Freq: Two times a day (BID) | INTRAMUSCULAR | Status: DC
  Administered 2011-04-09 – 2011-04-11 (×5): 160 mg via INTRAVENOUS
  Filled 2011-04-09 (×7): qty 16

## 2011-04-09 NOTE — Progress Notes (Signed)
[name] is currently receiving [facility] CM services.  I met with patient at bedside to discuss his current admission.  He has a out patient sleep study scheduled for Sunday the 24th.  I discussed this concern with his staff RN Darryl.  For any additional questions or new referrals please contact Anibal Henderson BSN RN [facility] Liaison at [phone].

## 2011-04-09 NOTE — Progress Notes (Signed)
@   Subjective:  Denies CP; complains of DOE   Objective:  Filed Vitals:   04/08/11 0846 04/08/11 1357 04/08/11 2146 04/09/11 0612  BP: 128/91 118/76 108/78 116/79  Pulse: 82 82 86 82  Temp:  97.3 F (36.3 C) 98.4 F (36.9 C) 97.6 F (36.4 C)  TempSrc:  Axillary    Resp:  20 20 18   Height:      Weight:    244 lb 7.8 oz (110.9 kg)  SpO2:  100% 94% 100%    Intake/Output from previous day:  Intake/Output Summary (Last 24 hours) at 04/09/11 0753 Last data filed at 04/09/11 1610  Gross per 24 hour  Intake   3308 ml  Output   2900 ml  Net    408 ml    Physical Exam: Physical exam: Well-developed well-nourished in no acute distress.  Skin is warm and dry.  HEENT is normal.  Neck is supple. Chest with diminished BS bases Cardiovascular exam is regular rate and rhythm.  Abdominal exam nontender or distended. No masses palpated. Patient with abdominal wall edema Extremities show 1+ edema. neuro grossly intact    Lab Results: Basic Metabolic Panel:  Basename 04/09/11 0615 04/08/11 0552  NA 138 138  K 3.9 3.8  CL 105 105  CO2 23 23  GLUCOSE 125* 138*  BUN 23 21  CREATININE 1.38* 1.35  CALCIUM 8.6 8.6  MG -- --  PHOS -- --   CBC:  Basename 04/07/11 1517  WBC 5.1  NEUTROABS 3.1  HGB 14.1  HCT 41.6  MCV 86.8  PLT 222   Cardiac Enzymes:  Basename 04/08/11 0552 04/07/11 2150 04/07/11 1517  CKTOTAL 199 290* 311*  CKMB 2.9 3.5 3.6  CKMBINDEX -- -- --  TROPONINI <0.30 <0.30 <0.30     Assessment/Plan:  1) CHF - Patient remains volume overloaded; change lasix to 160 Mg IV BID today and follow renal function. Would benefit from CHF clinic following DC. 2) NICM - Continue ACEI; continue coreg and advance when CHF improves. 3) S/P ICD 4) Hypertension - follow BP and adjust meds as needed. 5) DM 6) OSA  Olga Millers 04/09/2011, 7:53 AM

## 2011-04-10 DIAGNOSIS — I5023 Acute on chronic systolic (congestive) heart failure: Principal | ICD-10-CM

## 2011-04-10 LAB — GLUCOSE, CAPILLARY
Glucose-Capillary: 110 mg/dL — ABNORMAL HIGH (ref 70–99)
Glucose-Capillary: 157 mg/dL — ABNORMAL HIGH (ref 70–99)
Glucose-Capillary: 75 mg/dL (ref 70–99)

## 2011-04-10 LAB — BASIC METABOLIC PANEL
BUN: 27 mg/dL — ABNORMAL HIGH (ref 6–23)
Chloride: 103 mEq/L (ref 96–112)
Glucose, Bld: 97 mg/dL (ref 70–99)
Potassium: 4.2 mEq/L (ref 3.5–5.1)

## 2011-04-10 MED ORDER — POLYETHYLENE GLYCOL 3350 17 G PO PACK
17.0000 g | PACK | Freq: Every day | ORAL | Status: DC
  Administered 2011-04-10 – 2011-04-12 (×3): 17 g via ORAL
  Filled 2011-04-10 (×11): qty 1

## 2011-04-10 MED ORDER — DOCUSATE SODIUM 100 MG PO CAPS
100.0000 mg | ORAL_CAPSULE | Freq: Two times a day (BID) | ORAL | Status: DC
  Administered 2011-04-10 – 2011-04-20 (×20): 100 mg via ORAL
  Filled 2011-04-10 (×22): qty 1

## 2011-04-10 NOTE — Progress Notes (Signed)
Subjective:  No chest pain or dyspnea. Weight unchanged. Slept well with CPAP last night. Rhythm stable.  Objective:  Vital Signs in the last 24 hours: Temp:  [97.2 F (36.2 C)-98 F (36.7 C)] 98 F (36.7 C) (03/23 0520) Pulse Rate:  [75-85] 77  (03/23 0520) Resp:  [16-20] 18  (03/23 0520) BP: (101-110)/(69-78) 101/69 mmHg (03/23 0520) SpO2:  [97 %-100 %] 100 % (03/23 0520) Weight:  [110.224 kg (243 lb)] 110.224 kg (243 lb) (03/23 0520)  Intake/Output from previous day: 03/22 0701 - 03/23 0700 In: 1320 [P.O.:1320] Out: 4225 [Urine:4225] Intake/Output from this shift:       . amitriptyline  10 mg Oral QHS  . aspirin EC  81 mg Oral Daily  . atorvastatin  20 mg Oral q1800  . benazepril  40 mg Oral Daily  . carvedilol  6.25 mg Oral BID WC  . docusate sodium  100 mg Oral BID  . enoxaparin  40 mg Subcutaneous Q24H  . FLUoxetine  40 mg Oral Daily  . furosemide  160 mg Intravenous BID  . gabapentin  300 mg Oral TID  . glipiZIDE  10 mg Oral Q breakfast  . insulin aspart  0-20 Units Subcutaneous TID WC  . insulin aspart  0-5 Units Subcutaneous QHS  . insulin glargine  15 Units Subcutaneous QHS  . pantoprazole  40 mg Oral Q1200  . polyethylene glycol  17 g Oral Daily  . potassium chloride  20 mEq Oral Daily  . sodium chloride  3 mL Intravenous Q12H  . spironolactone  25 mg Oral Daily  . terazosin  1 mg Oral QHS      Physical Exam: The patient appears to be in no distress.  Head and neck exam reveals that the pupils are equal and reactive.  The extraocular movements are full.  There is no scleral icterus.  Mouth and pharynx are benign.  No lymphadenopathy.  No carotid bruits.  The jugular venous pressure is normal.  Thyroid is not enlarged or tender.  Chest is clear to percussion and auscultation.  Minimal rales. Expansion of the chest is symmetrical.  Heart reveals no abnormal lift or heave.  First and second heart sounds are normal.  There is no murmur gallop rub or  click.  The abdomen is soft and nontender.  Bowel sounds are normoactive.  There is no hepatosplenomegaly or mass.  There are no abdominal bruits.  Extremities reveal 2+ edema. Neurologic exam is normal strength and no lateralizing weakness.  No sensory deficits.  Integument reveals no rash  Lab Results:  Basename 04/07/11 1517  WBC 5.1  HGB 14.1  PLT 222    Basename 04/10/11 0600 04/09/11 0615  NA 137 138  K 4.2 3.9  CL 103 105  CO2 25 23  GLUCOSE 97 125*  BUN 27* 23  CREATININE 1.61* 1.38*    Basename 04/08/11 0552 04/07/11 2150  TROPONINI <0.30 <0.30   Hepatic Function Panel  Basename 04/07/11 1517  PROT 6.7  ALBUMIN 2.8*  AST 21  ALT 16  ALKPHOS 84  BILITOT 0.6  BILIDIR --  IBILI --   No results found for this basename: CHOL in the last 72 hours No results found for this basename: PROTIME in the last 72 hours  Imaging: Imaging results have been reviewed  Cardiac Studies: Telemetry shows NSR Assessment/Plan:  Patient Active Hospital Problem List:  1) CHF  On IV Lasix.  Edema better but weight same.  2) Renal insufficiency:  Creatinine higher  3) Hypertension:   BP normal     Plan: will contine to monitor renal function closely.  Continue IV lasix another day.   LOS: 3 days    Cassell Clement 04/10/2011, 9:59 AM

## 2011-04-11 ENCOUNTER — Ambulatory Visit (HOSPITAL_BASED_OUTPATIENT_CLINIC_OR_DEPARTMENT_OTHER): Payer: Medicare Other

## 2011-04-11 LAB — BASIC METABOLIC PANEL
BUN: 32 mg/dL — ABNORMAL HIGH (ref 6–23)
CO2: 26 mEq/L (ref 19–32)
Calcium: 8.9 mg/dL (ref 8.4–10.5)
GFR calc non Af Amer: 45 mL/min — ABNORMAL LOW (ref >90)
Glucose, Bld: 105 mg/dL — ABNORMAL HIGH (ref 70–99)
Potassium: 4.4 mEq/L (ref 3.5–5.1)

## 2011-04-11 LAB — GLUCOSE, CAPILLARY: Glucose-Capillary: 163 mg/dL — ABNORMAL HIGH (ref 70–99)

## 2011-04-11 MED ORDER — BENAZEPRIL HCL 20 MG PO TABS
20.0000 mg | ORAL_TABLET | Freq: Every day | ORAL | Status: DC
  Administered 2011-04-12 – 2011-04-13 (×2): 20 mg via ORAL
  Filled 2011-04-11 (×3): qty 1

## 2011-04-11 MED ORDER — FUROSEMIDE 10 MG/ML IJ SOLN
160.0000 mg | Freq: Every morning | INTRAVENOUS | Status: DC
  Filled 2011-04-11: qty 16

## 2011-04-11 NOTE — Progress Notes (Signed)
Subjective:  No chest pain or dyspnea. Weight is down 1 lb overnight. Slept well with CPAP last night. Rhythm stable. BUN and creatinine higher Objective:  Vital Signs in the last 24 hours: Temp:  [97 F (36.1 C)-98.7 F (37.1 C)] 97 F (36.1 C) (03/24 0507) Pulse Rate:  [77-82] 77  (03/24 0507) Resp:  [16-18] 16  (03/24 0507) BP: (102-109)/(69-82) 109/82 mmHg (03/24 0507) SpO2:  [98 %-100 %] 100 % (03/24 0507) Weight:  [109.77 kg (242 lb)] 109.77 kg (242 lb) (03/24 0507)  Intake/Output from previous day: 03/23 0701 - 03/24 0700 In: 1080 [P.O.:1080] Out: 2950 [Urine:2950] Intake/Output from this shift: Total I/O In: -  Out: 800 [Urine:800]     . amitriptyline  10 mg Oral QHS  . aspirin EC  81 mg Oral Daily  . atorvastatin  20 mg Oral q1800  . benazepril  40 mg Oral Daily  . carvedilol  6.25 mg Oral BID WC  . docusate sodium  100 mg Oral BID  . enoxaparin  40 mg Subcutaneous Q24H  . FLUoxetine  40 mg Oral Daily  . furosemide  160 mg Intravenous BID  . gabapentin  300 mg Oral TID  . glipiZIDE  10 mg Oral Q breakfast  . insulin aspart  0-20 Units Subcutaneous TID WC  . insulin aspart  0-5 Units Subcutaneous QHS  . insulin glargine  15 Units Subcutaneous QHS  . pantoprazole  40 mg Oral Q1200  . polyethylene glycol  17 g Oral Daily  . potassium chloride  20 mEq Oral Daily  . sodium chloride  3 mL Intravenous Q12H  . spironolactone  25 mg Oral Daily  . terazosin  1 mg Oral QHS      Physical Exam: The patient appears to be in no distress.  Head and neck exam reveals that the pupils are equal and reactive.  The extraocular movements are full.  There is no scleral icterus.  Mouth and pharynx are benign.  No lymphadenopathy.  No carotid bruits.  The jugular venous pressure is normal.  Thyroid is not enlarged or tender.  Chest is clear to percussion and auscultation.  Minimal rales. Expansion of the chest is symmetrical.  Heart reveals no abnormal lift or heave.   First and second heart sounds are normal.  There is no murmur gallop rub or click.  The abdomen is soft and nontender.  Bowel sounds are normoactive.  There is no hepatosplenomegaly or mass.  There are no abdominal bruits.  Extremities reveal 2+ edema, worse on left. Neurologic exam is normal strength and no lateralizing weakness.  No sensory deficits.  Integument reveals no rash  Lab Results: No results found for this basename: WBC:2,HGB:2,PLT:2 in the last 72 hours  Basename 04/11/11 0505 04/10/11 0600  NA 135 137  K 4.4 4.2  CL 99 103  CO2 26 25  GLUCOSE 105* 97  BUN 32* 27*  CREATININE 1.79* 1.61*   No results found for this basename: TROPONINI:2,CK,MB:2 in the last 72 hours Hepatic Function Panel No results found for this basename: PROT,ALBUMIN,AST,ALT,ALKPHOS,BILITOT,BILIDIR,IBILI in the last 72 hours No results found for this basename: CHOL in the last 72 hours No results found for this basename: PROTIME in the last 72 hours  Imaging: Imaging results have been reviewed  Cardiac Studies: Telemetry shows NSR Assessment/Plan:  Patient Active Hospital Problem List:  1) CHF  On IV Lasix.  Edema better but weight same.  2) Renal insufficiency:  Creatinine higher  3) Hypertension:  BP normal     Plan: will contine to monitor renal function closely.  Will decrease Lasix to once a day and reduce benazepril to 20 mg (already received 40 today).   LOS: 4 days    Cassell Clement 04/11/2011, 11:44 AM

## 2011-04-12 ENCOUNTER — Encounter (HOSPITAL_COMMUNITY)
Admission: AD | Disposition: A | Discharge status: Home / Self Care | Payer: Self-pay | Source: Ambulatory Visit | Attending: Internal Medicine

## 2011-04-12 DIAGNOSIS — R079 Chest pain, unspecified: Secondary | ICD-10-CM

## 2011-04-12 HISTORY — PX: RIGHT HEART CATHETERIZATION: SHX5447

## 2011-04-12 LAB — BASIC METABOLIC PANEL
BUN: 34 mg/dL — ABNORMAL HIGH (ref 6–23)
CO2: 28 mEq/L (ref 19–32)
Calcium: 9 mg/dL (ref 8.4–10.5)
GFR calc non Af Amer: 42 mL/min — ABNORMAL LOW (ref >90)
Glucose, Bld: 84 mg/dL (ref 70–99)
Sodium: 135 mEq/L (ref 135–145)

## 2011-04-12 LAB — CREATININE, SERUM
Creatinine, Ser: 1.71 mg/dL — ABNORMAL HIGH (ref 0.50–1.35)
GFR calc Af Amer: 55 mL/min — ABNORMAL LOW (ref >90)
GFR calc non Af Amer: 47 mL/min — ABNORMAL LOW (ref >90)

## 2011-04-12 LAB — GLUCOSE, CAPILLARY
Glucose-Capillary: 152 mg/dL — ABNORMAL HIGH (ref 70–99)
Glucose-Capillary: 49 mg/dL — ABNORMAL LOW (ref 70–99)

## 2011-04-12 LAB — POCT I-STAT 3, VENOUS BLOOD GAS (G3P V)
Acid-base deficit: 2 mmol/L (ref 0.0–2.0)
Bicarbonate: 25.2 mEq/L — ABNORMAL HIGH (ref 20.0–24.0)
TCO2: 30 mmol/L (ref 0–100)
pCO2, Ven: 50.4 mmHg — ABNORMAL HIGH (ref 45.0–50.0)
pCO2, Ven: 51.4 mmHg — ABNORMAL HIGH (ref 45.0–50.0)
pH, Ven: 7.308 — ABNORMAL HIGH (ref 7.250–7.300)
pH, Ven: 7.345 — ABNORMAL HIGH (ref 7.250–7.300)
pO2, Ven: 22 mmHg — CL (ref 30.0–45.0)

## 2011-04-12 SURGERY — RIGHT HEART CATH
Anesthesia: LOCAL

## 2011-04-12 MED ORDER — MIDAZOLAM HCL 2 MG/2ML IJ SOLN
INTRAMUSCULAR | Status: AC
  Filled 2011-04-12: qty 2

## 2011-04-12 MED ORDER — SODIUM CHLORIDE 0.9 % IV SOLN: 250.0000 mL | INTRAVENOUS | Status: DC

## 2011-04-12 MED ORDER — SODIUM CHLORIDE 0.9 % IJ SOLN: 3.0000 mL | INTRAMUSCULAR | Status: DC | PRN

## 2011-04-12 MED ORDER — GLIPIZIDE 10 MG PO TABS
10.0000 mg | ORAL_TABLET | Freq: Every day | ORAL | Status: DC
  Administered 2011-04-13 – 2011-04-20 (×8): 10 mg via ORAL
  Filled 2011-04-12 (×9): qty 1

## 2011-04-12 MED ORDER — FUROSEMIDE 10 MG/ML IJ SOLN
80.0000 mg | Freq: Two times a day (BID) | INTRAMUSCULAR | Status: DC
  Administered 2011-04-12: 80 mg via INTRAVENOUS
  Filled 2011-04-12 (×3): qty 8

## 2011-04-12 MED ORDER — DEXTROSE 50 % IV SOLN
INTRAVENOUS | Status: AC
  Administered 2011-04-12: 25 mL via INTRAVENOUS
  Filled 2011-04-12: qty 50

## 2011-04-12 MED ORDER — FENTANYL CITRATE 0.05 MG/ML IJ SOLN
INTRAMUSCULAR | Status: AC
  Filled 2011-04-12: qty 2

## 2011-04-12 MED ORDER — INSULIN ASPART 100 UNIT/ML ~~LOC~~ SOLN
0.0000 [IU] | Freq: Three times a day (TID) | SUBCUTANEOUS | Status: DC
  Administered 2011-04-13: 3 [IU] via SUBCUTANEOUS
  Administered 2011-04-14: 4 [IU] via SUBCUTANEOUS
  Administered 2011-04-14: 11 [IU] via SUBCUTANEOUS
  Administered 2011-04-15 – 2011-04-16 (×3): 3 [IU] via SUBCUTANEOUS
  Administered 2011-04-18: 4 [IU] via SUBCUTANEOUS
  Administered 2011-04-18: 2 [IU] via SUBCUTANEOUS

## 2011-04-12 MED ORDER — DEXTROSE 50 % IV SOLN
25.0000 mL | Freq: Once | INTRAVENOUS | Status: AC | PRN
  Administered 2011-04-12: 25 mL via INTRAVENOUS

## 2011-04-12 MED ORDER — LIDOCAINE HCL (PF) 1 % IJ SOLN
INTRAMUSCULAR | Status: AC
  Filled 2011-04-12: qty 30

## 2011-04-12 MED ORDER — FUROSEMIDE 40 MG PO TABS
40.0000 mg | ORAL_TABLET | Freq: Two times a day (BID) | ORAL | Status: DC
  Administered 2011-04-12: 40 mg via ORAL
  Filled 2011-04-12 (×3): qty 1

## 2011-04-12 MED ORDER — HEPARIN (PORCINE) IN NACL 2-0.9 UNIT/ML-% IJ SOLN
INTRAMUSCULAR | Status: AC
  Filled 2011-04-12: qty 1000

## 2011-04-12 MED ORDER — ONDANSETRON HCL 4 MG/2ML IJ SOLN: 4.0000 mg | Freq: Four times a day (QID) | INTRAMUSCULAR | Status: DC | PRN

## 2011-04-12 MED ORDER — SODIUM CHLORIDE 0.9 % IJ SOLN: 3.0000 mL | Freq: Two times a day (BID) | INTRAMUSCULAR | Status: DC

## 2011-04-12 MED ORDER — ENOXAPARIN SODIUM 40 MG/0.4ML ~~LOC~~ SOLN: 40.0000 mg | SUBCUTANEOUS | Status: DC

## 2011-04-12 MED ORDER — ACETAMINOPHEN 325 MG PO TABS: 650.0000 mg | ORAL_TABLET | ORAL | Status: DC | PRN

## 2011-04-12 MED ORDER — CARVEDILOL 6.25 MG PO TABS
6.2500 mg | ORAL_TABLET | Freq: Two times a day (BID) | ORAL | Status: DC
  Administered 2011-04-13 – 2011-04-14 (×3): 6.25 mg via ORAL
  Filled 2011-04-12 (×5): qty 1

## 2011-04-12 MED ORDER — MILRINONE IN DEXTROSE 200-5 MCG/ML-% IV SOLN
0.1250 ug/kg/min | INTRAVENOUS | Status: DC
  Administered 2011-04-12 – 2011-04-16 (×9): 0.25 ug/kg/min via INTRAVENOUS
  Administered 2011-04-16 (×2): 0.125 ug/kg/min via INTRAVENOUS
  Filled 2011-04-12 (×9): qty 100

## 2011-04-12 NOTE — Interval H&P Note (Signed)
History and Physical Interval Note:  04/12/2011 5:10 PM  [name]  has presented today for surgery, with the diagnosis of chest pain  The various methods of treatment have been discussed with the patient and family. After consideration of risks, benefits and other options for treatment, the patient has consented to  Procedure(s) (LRB): RIGHT HEART CATH (N/A) as a surgical intervention .  The patients' history has been reviewed, patient examined, no change in status, stable for surgery.  I have reviewed the patients' chart and labs.  Questions were answered to the patient's satisfaction.     Mckale Haffey

## 2011-04-12 NOTE — Progress Notes (Signed)
CBG: 49  Treatment: D50 IV 25 mL  Symptoms: Pale and Sweaty  Follow-up CBG: Time:1638 CBG Result:99  Possible Reasons for Event: Inadequate meal intake  Comments/MD notified:per protocol    Frederich Balding

## 2011-04-12 NOTE — Consult Note (Signed)
46 y/o male with HTN and DM2 with longstanding HF due to NICM (probably ETOH) EF 20%. Now with NYHA Class 4 symptoms and significant volume overload. Diuresis sluggish with worsening renal function. Previous ETOH use but none currently. Non-smoker. Cath 2008 40% LCX.  JVP up with S3 on exam and 2+ lower extremity edema.  Suspect he is low output and will need inotropes. Will need to consider initiating work-up for advanced therapies.   Plan RHC later today via RIJ approach with transfer to SDU/ICU. Will eventually need repeat coronary angio at some point.   Matson Welch,MD 11:09 AM

## 2011-04-12 NOTE — Progress Notes (Signed)
@   Subjective:  Denies CP; complains of DOE   Objective:  Filed Vitals:   04/11/11 1400 04/11/11 1822 04/11/11 2122 04/12/11 0349  BP: 99/62 114/56 110/80 108/75  Pulse: 79 68 79 74  Temp: 97.9 F (36.6 C)  97.3 F (36.3 C) 97 F (36.1 C)  TempSrc: Oral  Axillary Oral  Resp: 20  18 18   Height:      Weight:    239 lb 8 oz (108.636 kg)  SpO2: 100%  100% 99%    Intake/Output from previous day:  Intake/Output Summary (Last 24 hours) at 04/12/11 0809 Last data filed at 04/12/11 0117  Gross per 24 hour  Intake   1360 ml  Output   3400 ml  Net  -2040 ml    Physical Exam: Physical exam: Well-developed well-nourished in no acute distress.  Skin is warm and dry.  HEENT is normal.  Neck is supple. Chest CTA Cardiovascular exam is regular rate and rhythm.  Abdominal exam nontender or distended. No masses palpated. Patient with abdominal wall edema Extremities show 1+ edema. neuro grossly intact    Lab Results: Basic Metabolic Panel:  Basename 04/12/11 0550 04/11/11 0505  NA 135 135  K 4.5 4.4  CL 98 99  CO2 28 26  GLUCOSE 84 105*  BUN 34* 32*  CREATININE 1.88* 1.79*  CALCIUM 9.0 8.9  MG -- --  PHOS -- --    Assessment/Plan:  1) CHF - Patient remains volume overloaded but improving; change lasix to 80 Mg IV BID today and follow renal function. Will ask CHF team to review. May need right heart cath. 2) NICM - Continue ACEI; continue coreg.Marland Kitchen 3) S/P ICD 4) Hypertension - follow BP and adjust meds as needed. 5) DM 6) OSA  Olga Millers 04/12/2011, 8:09 AM

## 2011-04-12 NOTE — Op Note (Signed)
Cardiac Cath Procedure Note:  Indication:   Procedures performed:  1) Right heart catheterization  Description of procedure:   The risks and indication of the procedure were explained. Consent was signed and placed on the chart. An appropriate timeout was taken prior to the procedure. The right neck was prepped and draped in the routine sterile fashion and anesthetized with 1% local lidocaine.   A 7 FR venous sheath was placed in the right internal jugular vein using a modified Seldinger technique. A standard Swan-Ganz catheter was used for the procedure.   Complications: None apparent.  Findings:  RA = 22 with steep y-descents RV = 45/12/25 PA = 51/25 (34) PCW = 18-20 Fick cardiac output/index = 3.1/1.4 Thermo CO/CI = 3.6/1.6 PVR = 4.8 Woods O2 sat = 85% (after sedation) PA sat = 31%, 36%  Assessment:  1) Cardiogenic shock with biventricular failure and probable restrictive physiology.  Plan/Discussion:  Very difficult situation. Left sided pressures well compensated but has low output physiology with probable restriction versus severe RV failure. Will start milrinone and review echo with particular attention to RV. Given high right sided pressures would not be LVAD candidate. Will see how he responds to milrinone.   Cami Delawder,MD 6:10 PM     Arvilla Meres 6:02 PM

## 2011-04-12 NOTE — H&P (View-Only) (Signed)
@   Subjective:  Denies CP; complains of DOE   Objective:  Filed Vitals:   04/11/11 1400 04/11/11 1822 04/11/11 2122 04/12/11 0349  BP: 99/62 114/56 110/80 108/75  Pulse: 79 68 79 74  Temp: 97.9 F (36.6 C)  97.3 F (36.3 C) 97 F (36.1 C)  TempSrc: Oral  Axillary Oral  Resp: 20  18 18  Height:      Weight:    239 lb 8 oz (108.636 kg)  SpO2: 100%  100% 99%    Intake/Output from previous day:  Intake/Output Summary (Last 24 hours) at 04/12/11 0809 Last data filed at 04/12/11 0117  Gross per 24 hour  Intake   1360 ml  Output   3400 ml  Net  -2040 ml    Physical Exam: Physical exam: Well-developed well-nourished in no acute distress.  Skin is warm and dry.  HEENT is normal.  Neck is supple. Chest CTA Cardiovascular exam is regular rate and rhythm.  Abdominal exam nontender or distended. No masses palpated. Patient with abdominal wall edema Extremities show 1+ edema. neuro grossly intact    Lab Results: Basic Metabolic Panel:  Basename 04/12/11 0550 04/11/11 0505  NA 135 135  K 4.5 4.4  CL 98 99  CO2 28 26  GLUCOSE 84 105*  BUN 34* 32*  CREATININE 1.88* 1.79*  CALCIUM 9.0 8.9  MG -- --  PHOS -- --    Assessment/Plan:  1) CHF - Patient remains volume overloaded but improving; change lasix to 80 Mg IV BID today and follow renal function. Will ask CHF team to review. May need right heart cath. 2) NICM - Continue ACEI; continue coreg.. 3) S/P ICD 4) Hypertension - follow BP and adjust meds as needed. 5) DM 6) OSA  Naleah Kofoed 04/12/2011, 8:09 AM    

## 2011-04-12 NOTE — Progress Notes (Signed)
Placed patient on CPAP at 7cm with oxygen at 2lpm. Will continue to monitor

## 2011-04-13 ENCOUNTER — Other Ambulatory Visit: Payer: Self-pay | Admitting: Internal Medicine

## 2011-04-13 DIAGNOSIS — R570 Cardiogenic shock: Secondary | ICD-10-CM

## 2011-04-13 DIAGNOSIS — I369 Nonrheumatic tricuspid valve disorder, unspecified: Secondary | ICD-10-CM

## 2011-04-13 DIAGNOSIS — N189 Chronic kidney disease, unspecified: Secondary | ICD-10-CM

## 2011-04-13 DIAGNOSIS — M7989 Other specified soft tissue disorders: Secondary | ICD-10-CM

## 2011-04-13 LAB — BASIC METABOLIC PANEL
BUN: 36 mg/dL — ABNORMAL HIGH (ref 6–23)
Chloride: 99 mEq/L (ref 96–112)
GFR calc Af Amer: 53 mL/min — ABNORMAL LOW (ref >90)
GFR calc non Af Amer: 46 mL/min — ABNORMAL LOW (ref >90)
Potassium: 4.5 mEq/L (ref 3.5–5.1)
Sodium: 134 mEq/L — ABNORMAL LOW (ref 135–145)

## 2011-04-13 LAB — GLUCOSE, CAPILLARY
Glucose-Capillary: 72 mg/dL (ref 70–99)
Glucose-Capillary: 116 mg/dL — ABNORMAL HIGH (ref 70–99)

## 2011-04-13 LAB — CARBOXYHEMOGLOBIN
Carboxyhemoglobin: 1.5 % (ref 0.5–1.5)
O2 Saturation: 75.6 %

## 2011-04-13 MED ORDER — FUROSEMIDE 10 MG/ML IJ SOLN
40.0000 mg | Freq: Two times a day (BID) | INTRAMUSCULAR | Status: DC
  Administered 2011-04-13 – 2011-04-14 (×4): 40 mg via INTRAVENOUS
  Filled 2011-04-13 (×7): qty 4

## 2011-04-13 MED ORDER — ENOXAPARIN SODIUM 40 MG/0.4ML ~~LOC~~ SOLN
40.0000 mg | SUBCUTANEOUS | Status: DC
  Administered 2011-04-14 – 2011-04-19 (×6): 40 mg via SUBCUTANEOUS
  Filled 2011-04-13 (×7): qty 0.4

## 2011-04-13 NOTE — Progress Notes (Signed)
*  PRELIMINARY RESULTS* Vascular Ultrasound Lower extremity venous duplex has been completed.  Preliminary findings: Bilaterally no evidence of DVT or baker's cyst.  Farrel Demark RDMS 04/13/2011, 3:53 PM

## 2011-04-13 NOTE — Progress Notes (Signed)
Placed pt. On CPAP 10cmH2O via nasal mask with 2L O2 bled in. Pt. Is tolerating CPAP well at this time. All vitals are WNL.

## 2011-04-13 NOTE — Progress Notes (Signed)
  Echocardiogram 2D Echocardiogram has been performed.  Mercy Moore 04/13/2011, 12:33 PM

## 2011-04-13 NOTE — Progress Notes (Signed)
@   Subjective:   46 y/o male with HTN and DM2 with longstanding HF due to NICM (probably ETOH) EF 20%. Now with NYHA Class 4 symptoms and significant volume overload. Diuresis sluggish with worsening renal function. Previous ETOH use but none currently. Non-smoker. Cath 2008 40% LCX.  Underwent RHC on 04/12/11  RA = 22 with steep y-descents  RV = 45/12/25  PA = 51/25 (34)  PCW = 18-20  Fick cardiac output/index = 3.1/1.4  Thermo CO/CI = 3.6/1.6  PVR = 4.8 Woods  O2 sat = 85% (after sedation)  PA sat = 31%, 36%  Started on milrinone. Remains very SOB and orthopneic.   CVP 17 PA 44/24 PCWP 18-21 CO/CI 5.6/2.4 PVR 1.9  Co-ox 76%   Objective:  Filed Vitals:   04/13/11 0700 04/13/11 0739 04/13/11 0754 04/13/11 0800  BP: 100/62   110/70  Pulse: 73  78 76  Temp: 96.4 F (35.8 C) 97.4 F (36.3 C) 96.6 F (35.9 C) 96.8 F (36 C)  TempSrc:  Oral    Resp: 16  0 9  Height:      Weight:      SpO2: 98%  98% 99%    Intake/Output from previous day:  Intake/Output Summary (Last 24 hours) at 04/13/11 0908 Last data filed at 04/13/11 0800  Gross per 24 hour  Intake 1868.15 ml  Output   2865 ml  Net -996.85 ml    Physical Exam: Physical exam: Chronically ill appearing dyspneic with minimal movment Skin is warm and dry.  HEENT is normal.  Neck is supple. RIJ swan Chest CTA Cardiovascular exam is regular rate and rhythm. With S3 Abdominal exam nontender or distended. No masses palpated. Patient with abdominal wall edema Extremities show 1+ edema. neuro grossly intact    Lab Results: Basic Metabolic Panel:  Basename 04/13/11 0444 04/12/11 1900 04/12/11 0550  NA 134* -- 135  K 4.5 -- 4.5  CL 99 -- 98  CO2 27 -- 28  GLUCOSE 75 -- 84  BUN 36* -- 34*  CREATININE 1.75* 1.71* --  CALCIUM 8.9 -- 9.0  MG -- -- --  PHOS -- -- --    Assessment:  1) Acute on chronic biventricular HF due to NICM EF 20% 2) Cardiogenic shock 3) Acute respiratory failure 4) DM2 5)  OSA 6) A/C renal failure 7) Hyponatremia  PLAN/DISCUSSION  A bit confusing picture. Cath numbers consistent mostly with RV failure, however on looking back at previous echo RV was not too bad. Have ordered repeat echo to evaluate. IF RV worse will need to consider VQ scan to exclude PE - however currently can't lie flat enough for test. Will check LE u/s. Hemodynamics improved on milrinone - will continue. Continue gentle diuresis to see if we can bring CVP down further without hurting renal function. PVR low so unlikely to benefit from PDE-5 inhibitor. Given R-sided numbers not a candidate for VAD so options somewhat limited. Await echo to guide next steps. He remains critically ill. SPEP/UPEP pending.  The patient is critically ill with multiple organ systems failure and requires high complexity decision making for assessment and support, frequent evaluation and titration of therapies, application of advanced monitoring technologies and extensive interpretation of multiple databases.   Critical Care Time devoted to patient care services described in this note is 35 Minutes.    Correne Lalani 04/13/2011, 9:08 AM

## 2011-04-13 NOTE — Progress Notes (Signed)
Hospital patient Tereso Newcomer, [place]  8:39 AM 04/13/2011

## 2011-04-14 DIAGNOSIS — N179 Acute kidney failure, unspecified: Secondary | ICD-10-CM

## 2011-04-14 LAB — HEPATITIS PANEL, ACUTE
HCV Ab: NEGATIVE
Hep A IgM: NEGATIVE
Hep B C IgM: NEGATIVE

## 2011-04-14 LAB — GLUCOSE, CAPILLARY
Glucose-Capillary: 154 mg/dL — ABNORMAL HIGH (ref 70–99)
Glucose-Capillary: 76 mg/dL (ref 70–99)
Glucose-Capillary: 284 mg/dL — ABNORMAL HIGH (ref 70–99)
Glucose-Capillary: 114 mg/dL — ABNORMAL HIGH (ref 70–99)

## 2011-04-14 LAB — BASIC METABOLIC PANEL
BUN: 29 mg/dL — ABNORMAL HIGH (ref 6–23)
Creatinine, Ser: 1.68 mg/dL — ABNORMAL HIGH (ref 0.50–1.35)
GFR calc Af Amer: 56 mL/min — ABNORMAL LOW (ref >90)
GFR calc non Af Amer: 48 mL/min — ABNORMAL LOW (ref >90)

## 2011-04-14 MED ORDER — DIGOXIN 250 MCG PO TABS
0.2500 mg | ORAL_TABLET | Freq: Every day | ORAL | Status: DC
  Administered 2011-04-14 – 2011-04-20 (×7): 0.25 mg via ORAL
  Filled 2011-04-14 (×7): qty 1

## 2011-04-14 MED ORDER — BUTALBITAL-APAP-CAFFEINE 50-325-40 MG PO TABS
1.0000 | ORAL_TABLET | Freq: Four times a day (QID) | ORAL | Status: DC | PRN
  Administered 2011-04-14 – 2011-04-15 (×2): 1 via ORAL
  Filled 2011-04-14 (×4): qty 1

## 2011-04-14 MED ORDER — CARVEDILOL 3.125 MG PO TABS
3.1250 mg | ORAL_TABLET | Freq: Two times a day (BID) | ORAL | Status: DC
  Administered 2011-04-14 – 2011-04-20 (×12): 3.125 mg via ORAL
  Filled 2011-04-14 (×15): qty 1

## 2011-04-14 MED ORDER — BENAZEPRIL HCL 40 MG PO TABS
40.0000 mg | ORAL_TABLET | Freq: Every day | ORAL | Status: DC
Start: 2011-04-14 — End: 2011-04-20
  Administered 2011-04-14 – 2011-04-20 (×7): 40 mg via ORAL
  Filled 2011-04-14 (×7): qty 1

## 2011-04-14 NOTE — Progress Notes (Addendum)
Educated patient about the use of call bell when he needs to use bedside commode, patient stated that he will not use call bell and will get up as he wishes.  Educated patient about the risk of falls because of  patient care equipment presence in room.  Will continue to monitor closely.  Pt. Also refuses to wear blood pressure cuff for hourly blood pressures.  Will take BP q 4 hours.

## 2011-04-14 NOTE — Progress Notes (Signed)
Subjective:   46 y/o male with HTN and DM2 with longstanding HF due to NICM (probably ETOH) EF 20%. Now with NYHA Class 4 symptoms and significant volume overload. Diuresis sluggish with worsening renal function. Previous ETOH use but none currently. Non-smoker. Cath 2008 40% LCX.  Underwent RHC on 04/12/11  RA = 22 with steep y-descents  RV = 45/12/25  PA = 51/25 (34)  PCW = 18-20  Fick cardiac output/index = 3.1/1.4  Thermo CO/CI = 3.6/1.6  PVR = 4.8 Woods  O2 sat = 85% (after sedation)  PA sat = 31%, 36%  LE dopplers negative. Echo: LVEF 15%.  RV moderately dilated with mildly reduced systolic function.  RA and LA mildly dilated.  PAPP 52 mmHG   Started on milrinone. UOP ~3.8L yesterday.  SOB improved.  No orthopnea/PND with CPAP. Wants meat for breakfast.  We measured PA pressures and wedge ourselves this am.  CVP 13-15 PA 40/19 PCWP 19 CO/CI 5.4/2.3 PVR 1.5  Co-ox 65.5%  MEDS:    . amitriptyline  10 mg Oral QHS  . aspirin EC  81 mg Oral Daily  . atorvastatin  20 mg Oral q1800  . benazepril  20 mg Oral Daily  . carvedilol  6.25 mg Oral BID WC  . docusate sodium  100 mg Oral BID  . enoxaparin  40 mg Subcutaneous Q24H  . FLUoxetine  40 mg Oral Daily  . furosemide  40 mg Intravenous BID  . gabapentin  300 mg Oral TID  . glipiZIDE  10 mg Oral Q breakfast  . insulin aspart  0-20 Units Subcutaneous TID WC  . insulin aspart  0-5 Units Subcutaneous QHS  . insulin glargine  15 Units Subcutaneous QHS  . pantoprazole  40 mg Oral Q1200  . polyethylene glycol  17 g Oral Daily  . sodium chloride  3 mL Intravenous Q12H  . spironolactone  25 mg Oral Daily  . terazosin  1 mg Oral QHS  . DISCONTD: enoxaparin  40 mg Subcutaneous Q24H  . DISCONTD: furosemide  40 mg Oral BID  . DISCONTD: potassium chloride  20 mEq Oral Daily  . DISCONTD: sodium chloride  3 mL Intravenous Q12H    Objective:  Filed Vitals:   04/14/11 0600 04/14/11 0700 04/14/11 0733 04/14/11 0800  BP:    106/65   Pulse: 86 87 93 97  Temp: 97.5 F (36.4 C) 97.5 F (36.4 C) 98.1 F (36.7 C) 97.9 F (36.6 C)  TempSrc: Oral Oral Oral Oral  Resp: 12 18 20 10   Height:      Weight:      SpO2: 99% 99% 99% 99%    Intake/Output from previous day:  Intake/Output Summary (Last 24 hours) at 04/14/11 0851 Last data filed at 04/14/11 0800  Gross per 24 hour  Intake 1748.3 ml  Output   5425 ml  Net -3676.7 ml    Physical Exam: Physical exam: Chronically ill appearing, eating breakfast No resp distress Skin is warm and dry.  HEENT is normal.  Neck is supple. RIJ swan Chest CTA Cardiovascular exam is regular rate and rhythm. With S3 Abdominal exam nontender or distended. No masses palpated. Patient with abdominal wall edema Extremities trace edema. 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 Diuresing with improved renal function. CVP coming down and wedge up slightly suggest improving RV function with milrinone. Echo suggests only mild RV dysfunction however.  Will continue with milrinone and IV diuresis today.  Most likely transition to po lasix tomorrow.  Hold off on VQ scan as his dyspnea is improving with diuresis.  SPEP/UPEP pending. Will cut carvedilol in half. Start digoxin. Double lisinopril as BP tolerates. Will get dietary consult.   The patient is critically ill with multiple organ systems failure and requires high complexity decision making for assessment and support, frequent evaluation and titration  of therapies, application of advanced monitoring technologies and extensive interpretation of multiple databases.   Critical Care Time devoted to patient care services described in this note is 35 Minutes.  May Manrique,MD 8:57 AM

## 2011-04-14 NOTE — Progress Notes (Signed)
Pt. Had already placed himself on CPAP. CPAP is set at 10cmH20 via nasal mask with 2L O2 bled in. Pt. Is tolerating CPAP well at this time.

## 2011-04-14 NOTE — Progress Notes (Signed)
Brief Nutrition Note/Consult  RD consulted for CHF education. RD has spoke with this patient extensively about his diet and his health earlier in this admission. RD followed up with pt today, he did not have any questions but is requesting some kind of breakfast meat on his trays. Pt is aware of diet restrictions.

## 2011-04-14 NOTE — Progress Notes (Signed)
INITIAL ADULT NUTRITION ASSESSMENT Date: 04/14/2011   Time: 12:40 PM Reason for Assessment: Consult, Diet education  ASSESSMENT: Male 46 y.o.  Dx: CHF   Hx:  Past Medical History  Diagnosis Date  . Hypertension   . 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 History of peptic ulcer disease     EGD 09/2007 shows prox gastric ulcer with black eschar, treated with PPI  . Depression   . History of alcohol abuse     quit approx 2011-2012  . Angina   . Shortness of breath   . OSA (obstructive sleep apnea)     sleeps with oxygen  . Cardiomyopathy   . ICD (implantable cardiac defibrillator) in place   . Diabetes mellitus     insulin dependent  . GERD (gastroesophageal reflux disease)     Related Meds:     . amitriptyline  10 mg Oral QHS  . aspirin EC  81 mg Oral Daily  . atorvastatin  20 mg Oral q1800  . benazepril  40 mg Oral Daily  . carvedilol  3.125 mg Oral BID WC  . digoxin  0.25 mg Oral Daily  . docusate sodium  100 mg Oral BID  . enoxaparin  40 mg Subcutaneous Q24H  . FLUoxetine  40 mg Oral Daily  . furosemide  40 mg Intravenous BID  . gabapentin  300 mg Oral TID  . glipiZIDE  10 mg Oral Q breakfast  . insulin aspart  0-20 Units Subcutaneous TID WC  . insulin aspart  0-5 Units Subcutaneous QHS  . insulin glargine  15 Units Subcutaneous QHS  . pantoprazole  40 mg Oral Q1200  . polyethylene glycol  17 g Oral Daily  . sodium chloride  3 mL Intravenous Q12H  . spironolactone  25 mg Oral Daily  . terazosin  1 mg Oral QHS  . DISCONTD: benazepril  20 mg Oral Daily  . DISCONTD: carvedilol  6.25 mg Oral BID WC  . DISCONTD: potassium chloride  20 mEq Oral Daily     Ht: 6' (182.9 cm)  Wt: 234 lb 12.6 oz (106.5 kg)  Ideal Wt: 80.9 kg % Ideal Wt: 131%  Usual Wt:  Wt Readings from Last 3 Encounters:  04/14/11 234 lb 12.6 oz (106.5 kg)  04/14/11 234 lb 12.6 oz (106.5 kg)    04/07/11 246 lb (111.585 kg)    % Usual Wt: 95%  Body mass index is 31.84 kg/(m^2). Obesity unspecified  Food/Nutrition Related Hx: Does not follow a CHF diet at home, does not always weight him self  Labs:  CMP     Component Value Date/Time   NA 134* 04/14/2011 0500   K 4.8 04/14/2011 0500   CL 101 04/14/2011 0500   CO2 26 04/14/2011 0500   GLUCOSE 76 04/14/2011 0500   BUN 29* 04/14/2011 0500   CREATININE 1.68* 04/14/2011 0500   CREATININE 1.35 03/25/2011 1431   CALCIUM 8.6 04/14/2011 0500   PROT 6.7 04/07/2011 1517   ALBUMIN 2.8* 04/07/2011 1517   AST 21 04/07/2011 1517   ALT 16 04/07/2011 1517   ALKPHOS 84 04/07/2011 1517   BILITOT 0.6 04/07/2011 1517   GFRNONAA 48* 04/14/2011 0500   GFRAA 56* 04/14/2011 0500     Intake/Output Summary (Last 24 hours) at 04/14/11 1244 Last data filed at 04/14/11 1200  Gross per 24 hour  Intake 1651.3 ml  Output  7501 ml  Net -5849.7 ml     Diet Order: Carb Control, 2 gm sodium restriction, 1.5 L fluid restriction noted on door to pt room  Supplements/Tube Feeding: none  IVF:    milrinone Last Rate: 0.25 mcg/kg/min (04/14/11 0458)    Estimated Nutritional Needs:   Kcal: 2000-2200 Protein: 65-75 Fluid:  1.5 L per fluid restrictions   Pt has been seen previously by RD who spoke with pt extensively about diet and health, for details please see note on 3/21. RD went to patient room to see if he had any other questions or needs and pt requested bacon or sausage on breakfast meal trays. Pt is aware of sodium restriction but is "tired of just eating eggs, potatoes and grits." RD explained that this facility does not have a low sodium breakfast meat option at this time.  PO intake of his diet has been mostly 100% so far, meeting his estimated needs. Pt has had continued difficulty with edema, difficult to diurese at this admission, and continued poor renal function.  Based on clinical assessment RD recommends that Pt remains on 2 gm sodium  restriction and does not recommend patient receive breakfast meats on this tray.   NUTRITION DIAGNOSIS: -Not ready for diet/lifestyle change (NB-1.3).  Status: Ongoing  RELATED TO: denies connection of diet to health  AS EVIDENCE BY: requesting high sodium meats after education  MONITORING/EVALUATION(Goals): Goal: pt will follow 2 gm sodium diet  Monitor: PO intake, weight, diet compliance   EDUCATION NEEDS: -Education needs addressed  INTERVENTION: 1. Recommend continue with current diet, restrict breakfast meats 2. RD will continue to follow  Dietitian [phone]  DOCUMENTATION CODES Per approved criteria  -Obesity Unspecified    [name] 04/14/2011, 12:40 PM

## 2011-04-14 NOTE — Progress Notes (Signed)
Patient sat in chair for 4 hours today. Educated patient on importance of progressing activity.

## 2011-04-15 LAB — GLUCOSE, CAPILLARY
Glucose-Capillary: 113 mg/dL — ABNORMAL HIGH (ref 70–99)
Glucose-Capillary: 69 mg/dL — ABNORMAL LOW (ref 70–99)
Glucose-Capillary: 92 mg/dL (ref 70–99)

## 2011-04-15 LAB — PROTEIN ELECTROPH W RFLX QUANT IMMUNOGLOBULINS
Albumin ELP: 45.9 % — ABNORMAL LOW (ref 55.8–66.1)
Alpha-1-Globulin: 7.6 % — ABNORMAL HIGH (ref 2.9–4.9)
Gamma Globulin: 21 % — ABNORMAL HIGH (ref 11.1–18.8)
Total Protein ELP: 6.8 g/dL (ref 6.0–8.3)

## 2011-04-15 LAB — BASIC METABOLIC PANEL
BUN: 23 mg/dL (ref 6–23)
Calcium: 8.6 mg/dL (ref 8.4–10.5)
GFR calc Af Amer: 61 mL/min — ABNORMAL LOW (ref >90)
GFR calc non Af Amer: 50 mL/min — ABNORMAL LOW (ref >90)
GFR calc non Af Amer: 53 mL/min — ABNORMAL LOW (ref >90)
Potassium: 5.5 mEq/L — ABNORMAL HIGH (ref 3.5–5.1)
Sodium: 133 mEq/L — ABNORMAL LOW (ref 135–145)

## 2011-04-15 LAB — UIFE/LIGHT CHAINS/TP QN, 24-HR UR
Beta, Urine: DETECTED — AB
Free Lambda Lt Chains,Ur: 0.03 mg/dL (ref 0.02–0.67)
Gamma Globulin, Urine: DETECTED — AB

## 2011-04-15 LAB — CARBOXYHEMOGLOBIN
Carboxyhemoglobin: 1.5 % (ref 0.5–1.5)
Methemoglobin: 1.3 % (ref 0.0–1.5)
O2 Saturation: 74.1 %
Total hemoglobin: 15 g/dL (ref 13.5–18.0)

## 2011-04-15 MED ORDER — FUROSEMIDE 10 MG/ML IJ SOLN
40.0000 mg | Freq: Two times a day (BID) | INTRAMUSCULAR | Status: DC
  Administered 2011-04-15 (×2): 40 mg via INTRAVENOUS
  Filled 2011-04-15 (×5): qty 4

## 2011-04-15 MED ORDER — FUROSEMIDE 80 MG PO TABS
80.0000 mg | ORAL_TABLET | Freq: Two times a day (BID) | ORAL | Status: DC
  Filled 2011-04-15 (×3): qty 1

## 2011-04-15 MED ORDER — FENTANYL CITRATE 0.05 MG/ML IJ SOLN
50.0000 ug | Freq: Four times a day (QID) | INTRAMUSCULAR | Status: DC | PRN
  Administered 2011-04-15: 50 ug via INTRAVENOUS
  Filled 2011-04-15 (×2): qty 2

## 2011-04-15 NOTE — Telephone Encounter (Signed)
Pharmacy aware - denied. 

## 2011-04-15 NOTE — Progress Notes (Signed)
Patient sat in chair for 5 hours today. Stood at sink and washed himself. Will continue to progress activity.

## 2011-04-15 NOTE — Progress Notes (Signed)
Clinical Social Work Department ADVANCED HEART FAILURE BRIEF PSYCHOSOCIAL ASSESSMENT 04/15/2011  Patient:  [name], [name]   Account Number:  [id number]  Admit Date:  04/07/2011  Clinical Social Worker:  Juliette Mangle   Date/Time:  04/14/2011 03:30 PM  Referred by:  CHF Pilot  Referral date:  04/14/2011 Referred for  Psychosocial assessment  Other - See comment   Other referral type:    Home Health Screen for CHF  CHF Symptom Managment   Interview type:  Patient Interview Other interview type:    PSYCHOSOCIAL DATA Living Status:  WIFE Admitted from facility?   Level of care:    Primary Support Name Primary Support Relationship  Christina Guymon-[phone] SPOUSE   Degree of support available:   Good    Current Concerns  Adjustment to Illness    Social Work assessment/plan:   CSW met with patient at bedside to provide support and provide education on CHF symptom management.  Patient was sleepy and was not in state to answer questions. CSW will return to  assess for degree of support, the need for home health, depressive symptoms. CSW will continue to follow.   Depression:   Unknown   PHQ-9:     Home health:   Home health choice:     Other Referrals/interventions:   Patient's/Family's response to plan of care:   Patient was appreciative of support by CSW. CSW will need to return to discuss d/c needs. CSW will continue to follow.  Sabino Niemann, MSW, [employer] [phone]

## 2011-04-15 NOTE — Progress Notes (Signed)
Subjective:   46 y/o male with HTN and DM2 with longstanding HF due to NICM (probably ETOH) EF 20%. Now with NYHA Class 4 symptoms and significant volume overload. Diuresis sluggish with worsening renal function. Previous ETOH use but none currently. Non-smoker. Cath 2008 40% LCX.   Underwent RHC on 04/12/11  RA = 22 with steep y-descents  RV = 45/12/25  PA = 51/25 (34)  PCW = 18-20  Fick cardiac output/index = 3.1/1.4  Thermo CO/CI = 3.6/1.6  PVR = 4.8 Woods  O2 sat = 85% (after sedation)  PA sat = 31%, 36%  LE dopplers negative.   Echo: LVEF 15%. RV moderately dilated with mildly reduced systolic function. RA and LA mildly dilated. PAPP 52 mmHG   Continues on milrinone. UOP ~5.7L yesterday. Weight down 15 pounds. Tolerated increase Ace Inhibitor. SOB improved. No orthopnea/PND. Using CPAP at night.    Complains of L neck pain and HA. Still wants meat for breakfast.   CVP 11-12 PA 44/21  PCWP 22 CO/CI 6.2/2.7  Co-ox 74.1%     Intake/Output Summary (Last 24 hours) at 04/15/11 0749 Last data filed at 04/15/11 0700  Gross per 24 hour  Intake 2376.3 ml  Output   7951 ml  Net -5574.7 ml    Current meds:    . amitriptyline  10 mg Oral QHS  . aspirin EC  81 mg Oral Daily  . atorvastatin  20 mg Oral q1800  . benazepril  40 mg Oral Daily  . carvedilol  3.125 mg Oral BID WC  . digoxin  0.25 mg Oral Daily  . docusate sodium  100 mg Oral BID  . enoxaparin  40 mg Subcutaneous Q24H  . FLUoxetine  40 mg Oral Daily  . furosemide  80 mg Oral BID  . gabapentin  300 mg Oral TID  . glipiZIDE  10 mg Oral Q breakfast  . insulin aspart  0-20 Units Subcutaneous TID WC  . insulin aspart  0-5 Units Subcutaneous QHS  . insulin glargine  15 Units Subcutaneous QHS  . pantoprazole  40 mg Oral Q1200  . polyethylene glycol  17 g Oral Daily  . sodium chloride  3 mL Intravenous Q12H  . spironolactone  25 mg Oral Daily  . terazosin  1 mg Oral QHS  . DISCONTD: benazepril  20 mg Oral  Daily  . DISCONTD: carvedilol  6.25 mg Oral BID WC  . DISCONTD: furosemide  40 mg Intravenous BID  . DISCONTD: potassium chloride  20 mEq Oral Daily   Infusions:    . milrinone 0.25 mcg/kg/min (04/15/11 0631)     Objective:  Blood pressure 111/67, pulse 90, temperature 98.1 F (36.7 C), temperature source Core (Comment), resp. rate 15, height 6' (1.829 m), weight 104.2 kg (229 lb 11.5 oz), SpO2 100.00%. Weight change: -2.3 kg (-5 lb 1.1 oz)  Physical Exam: Physical exam:  Chronically ill appearing No resp distress  Skin:  warm and dry.  HEENT:  normal.  Neck:  supple. RIJ swan  Chest:  CTA  Cardiovascular:  exam is regular rate and rhythm. With S3  Abdominal:  nontender or distended. No masses palpated. Patient with abdominal wall edema  Extremities: no  edema. Bilateral ted hose in place Neuro: neuro grossly intact     Telemetry: SR Lab Results: Basic Metabolic Panel:  Lab 04/15/11 1610 04/14/11 0500 04/13/11 0444 04/12/11 1900 04/12/11 0550 04/11/11 0505  NA 133* 134* 134* -- 135 135  K 5.1 4.8 -- -- -- --  CL 99 101 99 -- 98 99  CO2 26 26 27  -- 28 26  GLUCOSE 105* 76 75 -- 84 105*  BUN 24* 29* 36* -- 34* 32*  CREATININE 1.64* 1.68* 1.75* 1.71* 1.88* --  CALCIUM 8.6 8.6 8.9 -- 9.0 8.9  MG -- -- -- -- -- --  PHOS -- -- -- -- -- --   Liver Function Tests: No results found for this basename: AST:5,ALT:5,ALKPHOS:5,BILITOT:5,PROT:5,ALBUMIN:5 in the last 168 hours No results found for this basename: LIPASE:5,AMYLASE:5 in the last 168 hours No results found for this basename: AMMONIA:5 in the last 168 hours CBC: No results found for this basename: WBC:5,NEUTROABS:5,HGB:5,HCT:5,MCV:5,PLT:5 in the last 168 hours Cardiac Enzymes: No results found for this basename: CKTOTAL:5,CKMB:5,CKMBINDEX:5,TROPONINI:5 in the last 168 hours BNP: No components found with this basename: POCBNP:5 CBG:  Lab 04/14/11 2054 04/14/11 1550 04/14/11 1235 04/14/11 0728 04/13/11 2101    GLUCAP 114* 284* 76 154* 176*   Microbiology: Lab Results  Component Value Date   CULT NO SALMONELLA, SHIGELLA, CAMPYLOBACTER, OR YERSINIA ISOLATED 08/15/2009   CULT NO GROWTH 5 DAYS 08/15/2009   CULT NO GROWTH 5 DAYS 08/15/2009   CULT NO GROWTH 09/30/2007   CULT NORMAL OROPHARYNGEAL FLORA 11/02/2006   No results found for this basename: CULT:2,SDES:2 in the last 168 hours  Imaging: No results found.   ASSESSMENT:  1) Acute on chronic biventricular HF due to NICM EF 20%  2) Cardiogenic shock  - on milrinone  3) Acute respiratory failure, improving  4) DM2  5) OSA  6) A/C renal failure, stable  7) Hyponatremia   PLAN/DISCUSSION: Continues to improve on Milirinone. Diuresed 5.7 liters yesterday. [name] numbers checked personally. CVP coming down wedge still up. Consistent with improved RV function. Renal function improving. Will continue IV lasix and Milrinone. Leave swan in for now. Dietary consult appreciated. Mobilize in room. Repeat BMET this afternoon to watch K. May need to stop spiro.   The patient is critically ill with multiple organ systems failure and requires high complexity decision making for assessment and support, frequent evaluation and titration of therapies, application of advanced monitoring technologies and extensive interpretation of multiple databases.   Critical Care Time devoted to patient care services described in this note is 35 Minutes.    LOS: 8 days    Arvilla Meres, MD 04/15/2011, 7:49 AM

## 2011-04-15 NOTE — Progress Notes (Signed)
UR Completed.  [name] Jane [phone] 04/15/2011  

## 2011-04-16 LAB — GLUCOSE, CAPILLARY
Glucose-Capillary: 144 mg/dL — ABNORMAL HIGH (ref 70–99)
Glucose-Capillary: 93 mg/dL (ref 70–99)
Glucose-Capillary: 140 mg/dL — ABNORMAL HIGH (ref 70–99)
Glucose-Capillary: 129 mg/dL — ABNORMAL HIGH (ref 70–99)
Glucose-Capillary: 185 mg/dL — ABNORMAL HIGH (ref 70–99)

## 2011-04-16 LAB — CARBOXYHEMOGLOBIN: Total hemoglobin: 16.1 g/dL (ref 13.5–18.0)

## 2011-04-16 LAB — BASIC METABOLIC PANEL
CO2: 26 mEq/L (ref 19–32)
Calcium: 9 mg/dL (ref 8.4–10.5)
Creatinine, Ser: 1.6 mg/dL — ABNORMAL HIGH (ref 0.50–1.35)
GFR calc non Af Amer: 51 mL/min — ABNORMAL LOW (ref >90)
Glucose, Bld: 99 mg/dL (ref 70–99)

## 2011-04-16 MED ORDER — FUROSEMIDE 40 MG PO TABS
40.0000 mg | ORAL_TABLET | Freq: Two times a day (BID) | ORAL | Status: DC
  Administered 2011-04-16 (×2): 40 mg via ORAL
  Filled 2011-04-16 (×4): qty 1

## 2011-04-16 NOTE — Progress Notes (Signed)
Brief Nutrition Note:  RD spoke with Dr. Teressa Lower re: pt desire for breakfast meats. Ok to have 2 strips of bacon at breakfast. Continue with sodium restriction at all other meals.   [name] [phone]

## 2011-04-16 NOTE — Progress Notes (Signed)
Called by RN re: patient being very agitated. He is frustrated for mulitple reasons. I spoke with him at length and heard his frustrations. He is very hungry and upset with inconsistencies when ordering apparently heart healthy options for his diet, yet being unable to have the options he selects. He is threatening to pull out his SG catheter and leave AMA. Tonight, I have allowed him to have a snack. It may be over his Na and carb restriction, however I would prefer this over leaving AMA. A dietician consult has been made for tomorrow. They will be called to coordinate his diet better. The name and number of the RD has been left with the nurse and will be called in the morning. The patient understood and agreed.    Jacqulyn Bath, PA-C 04/16/2011 8:20 PM

## 2011-04-16 NOTE — Progress Notes (Signed)
Subjective:   46 y/o male with HTN and DM2 with longstanding HF due to NICM (probably ETOH) EF 20%. Now with NYHA Class 4 symptoms and significant volume overload. Diuresis sluggish with worsening renal function. Previous ETOH use but none currently. Non-smoker. Cath 2008 40% LCX.   Underwent RHC on 04/12/11  RA = 22 with steep y-descents  RV = 45/12/25  PA = 51/25 (34)  PCW = 18-20  Fick cardiac output/index = 3.1/1.4  Thermo CO/CI = 3.6/1.6  PVR = 4.8 Woods  O2 sat = 85% (after sedation)  PA sat = 31%, 36%  LE dopplers negative.   Echo: LVEF 15%. RV moderately dilated with mildly reduced systolic function. RA and LA mildly dilated. PAPP 52 mmHG   Continues on milrinone. UOP ~6L for second day in a row. CVP now down to 10. Weight down 22 pounds. Tolerated increase Ace Inhibitor but spiro had to be stopped due to hyperkalemia. . No SOB/orthopnea/PND. Using CPAP at night.     Still wants meat for breakfast. [name] numbers rechecked personally.   CVP 8-10 PA 39/25  PCWP 19 CO/CI 7.5/3.4 SVR 756 PVR 1.5 Co-ox 70%     Intake/Output Summary (Last 24 hours) at 04/16/11 0654 Last data filed at 04/16/11 0600  Gross per 24 hour  Intake 1738.2 ml  Output   8000 ml  Net -6261.8 ml    Current meds:    . amitriptyline  10 mg Oral QHS  . aspirin EC  81 mg Oral Daily  . atorvastatin  20 mg Oral q1800  . benazepril  40 mg Oral Daily  . carvedilol  3.125 mg Oral BID WC  . digoxin  0.25 mg Oral Daily  . docusate sodium  100 mg Oral BID  . enoxaparin  40 mg Subcutaneous Q24H  . FLUoxetine  40 mg Oral Daily  . furosemide  40 mg Intravenous BID  . gabapentin  300 mg Oral TID  . glipiZIDE  10 mg Oral Q breakfast  . insulin aspart  0-20 Units Subcutaneous TID WC  . insulin aspart  0-5 Units Subcutaneous QHS  . insulin glargine  15 Units Subcutaneous QHS  . pantoprazole  40 mg Oral Q1200  . polyethylene glycol  17 g Oral Daily  . sodium chloride  3 mL Intravenous Q12H  .  terazosin  1 mg Oral QHS  . DISCONTD: furosemide  40 mg Intravenous BID  . DISCONTD: furosemide  80 mg Oral BID  . DISCONTD: spironolactone  25 mg Oral Daily   Infusions:    . milrinone 0.25 mcg/kg/min (04/15/11 1827)     Objective:  Blood pressure 114/64, pulse 90, temperature 97.9 F (36.6 C), temperature source Oral, resp. rate 16, height 6' (1.829 m), weight 100.4 kg (221 lb 5.5 oz), SpO2 100.00%. Weight change: -3.8 kg (-8 lb 6 oz)  Physical Exam: Physical exam:  Chronically ill appearing No resp distress  Skin:  warm and dry.  HEENT:  normal.  Neck:  supple. RIJ swan  Chest:  CTA  Cardiovascular:  exam is regular rate and rhythm. With S3  Abdominal:  nontender or distended. No masses palpated. Patient with abdominal wall edema  Extremities: no  edema. Bilateral ted hose in place Neuro: neuro grossly intact     Telemetry: SR Lab Results: Basic Metabolic Panel:  Lab 04/16/11 1610 04/15/11 1355 04/15/11 0500 04/14/11 0500 04/13/11 0444  NA 134* 132* 133* 134* 134*  K 4.8 5.5* -- -- --  CL 101  99 99 101 99  CO2 26 24 26 26 27   GLUCOSE 99 94 105* 76 75  BUN 27* 23 24* 29* 36*  CREATININE 1.60* 1.56* 1.64* 1.68* 1.75*  CALCIUM 9.0 9.2 8.6 8.6 8.9  MG -- -- -- -- --  PHOS -- -- -- -- --   Liver Function Tests: No results found for this basename: AST:5,ALT:5,ALKPHOS:5,BILITOT:5,PROT:5,ALBUMIN:5 in the last 168 hours No results found for this basename: LIPASE:5,AMYLASE:5 in the last 168 hours No results found for this basename: AMMONIA:5 in the last 168 hours CBC: No results found for this basename: WBC:5,NEUTROABS:5,HGB:5,HCT:5,MCV:5,PLT:5 in the last 168 hours Cardiac Enzymes: No results found for this basename: CKTOTAL:5,CKMB:5,CKMBINDEX:5,TROPONINI:5 in the last 168 hours BNP: No components found with this basename: POCBNP:5 CBG:  Lab 04/16/11 0318 04/15/11 2122 04/15/11 1612 04/15/11 1215 04/15/11 0803  GLUCAP 144* 92 69* 125* 113*   Microbiology: Lab  Results  Component Value Date   CULT NO SALMONELLA, SHIGELLA, CAMPYLOBACTER, OR YERSINIA ISOLATED 08/15/2009   CULT NO GROWTH 5 DAYS 08/15/2009   CULT NO GROWTH 5 DAYS 08/15/2009   CULT NO GROWTH 09/30/2007   CULT NORMAL OROPHARYNGEAL FLORA 11/02/2006   No results found for this basename: CULT:2,SDES:2 in the last 168 hours  Imaging: No results found.   ASSESSMENT:  1) Acute on chronic biventricular HF due to NICM EF 20%  2) Cardiogenic shock  - on milrinone  3) Acute respiratory failure, improving  4) DM2  5) OSA  6) A/C renal failure, stable  7) Hyponatremia  8) Hyperkalemia - requiring discontinuation of spiro on 3/28  PLAN/DISCUSSION:  Continues to improve on Milirinone. Hemodynamics much better with improvement of RV function. (CVP way down. PCWP essentially stable). [name] stopped due to hyperkalemia.   Will begin milrinone wean today and cut dose to 0.176mcg/kg/min. Change lasix to po and follow numbers closely. If not able to tolerate milrinone wean may have to consider home inotropes as I do not think he is candidate for advanced therapies at this point given previous compliance issues and social situation (will have to investigate this more).   Plan for tomorrow would be to stop milrinone if hemodynamics look OK and adjust diuretics as needed. Would leave swan in until at least Sunday to make sure he is stable off inotropes. Will not push b-blocker at this point.   The patient is critically ill with multiple organ systems failure and requires high complexity decision making for assessment and support, frequent evaluation and titration of therapies, application of advanced monitoring technologies and extensive interpretation of multiple databases.   Critical Care Time devoted to patient care services described in this note is 35 Minutes.    LOS: 9 days    Arvilla Meres, MD 04/16/2011, 6:54 AM

## 2011-04-17 LAB — CARBOXYHEMOGLOBIN
Carboxyhemoglobin: 1.1 % (ref 0.5–1.5)
Methemoglobin: 1.1 % (ref 0.0–1.5)
O2 Saturation: 76.6 %
Total hemoglobin: 17.2 g/dL (ref 13.5–18.0)

## 2011-04-17 LAB — BASIC METABOLIC PANEL
CO2: 25 mEq/L (ref 19–32)
Calcium: 9.1 mg/dL (ref 8.4–10.5)
Chloride: 99 mEq/L (ref 96–112)
Glucose, Bld: 93 mg/dL (ref 70–99)
Potassium: 4.9 mEq/L (ref 3.5–5.1)
Sodium: 133 mEq/L — ABNORMAL LOW (ref 135–145)

## 2011-04-17 LAB — GLUCOSE, CAPILLARY
Glucose-Capillary: 95 mg/dL (ref 70–99)
Glucose-Capillary: 206 mg/dL — ABNORMAL HIGH (ref 70–99)

## 2011-04-17 MED ORDER — FUROSEMIDE 40 MG PO TABS
40.0000 mg | ORAL_TABLET | Freq: Two times a day (BID) | ORAL | Status: DC
  Administered 2011-04-17 – 2011-04-20 (×6): 40 mg via ORAL
  Filled 2011-04-17 (×6): qty 1

## 2011-04-17 MED ORDER — SODIUM CHLORIDE 0.9 % IV SOLN
INTRAVENOUS | Status: DC
  Administered 2011-04-17 – 2011-04-18 (×2): via INTRAVENOUS

## 2011-04-17 MED ORDER — HYDRALAZINE HCL 10 MG PO TABS
10.0000 mg | ORAL_TABLET | Freq: Three times a day (TID) | ORAL | Status: DC
  Administered 2011-04-17 – 2011-04-20 (×8): 10 mg via ORAL
  Filled 2011-04-17 (×12): qty 1

## 2011-04-17 NOTE — Progress Notes (Signed)
Nutrition Brief Note/Education Consult  New consult for diet education. Patient verbalizes frustration with ordering food, not getting items he ordered. He now has a standing order with [facility] for same breakfast, lunch, and dinner everyday. He prefers this to arguing about food choices everyday, as he eats the same foods daily at home. RD clarified patient's dietary preferences and input dinner request.   Diet changed to Carbohydrate Modified, 2 gram sodium restriction.  RD previously spoke extensively about patient's diet restrictions. Patient understands and had no further questions.

## 2011-04-17 NOTE — Progress Notes (Signed)
Subjective:   46 y/o male with HTN and DM2 with longstanding HF due to NICM (probably ETOH) EF 20%. Now with NYHA Class 4 symptoms and significant volume overload. Diuresis sluggish with worsening renal function. Previous ETOH use but none currently. Non-smoker. Cath 2008 40% LCX.   Underwent RHC on 04/12/11  RA = 22 with steep y-descents  RV = 45/12/25  PA = 51/25 (34)  PCW = 18-20  Fick cardiac output/index = 3.1/1.4  Thermo CO/CI = 3.6/1.6  PVR = 4.8 Woods  O2 sat = 85% (after sedation)  PA sat = 31%, 36%  LE dopplers negative.   Echo: LVEF 15%. RV moderately dilated with mildly reduced systolic function. RA and LA mildly dilated. PAPP 52 mmHG   Milrinone decreased yesterday. UOP ~5-6L for third day in a row. CVP now down to ~5 (was 25).  Very agitated last night about dietary choices tried to sign out AMA . No SOB/orthopnea/PND. Using CPAP at night.   Swan numbers rechecked personally.   CVP 2 PA 38/18 (25)  PCWP 12 Co-ox 76%     Intake/Output Summary (Last 24 hours) at 04/17/11 0747 Last data filed at 04/17/11 0700  Gross per 24 hour  Intake 1124.4 ml  Output   5250 ml  Net -4125.6 ml    Current meds:    . amitriptyline  10 mg Oral QHS  . aspirin EC  81 mg Oral Daily  . atorvastatin  20 mg Oral q1800  . benazepril  40 mg Oral Daily  . carvedilol  3.125 mg Oral BID WC  . digoxin  0.25 mg Oral Daily  . docusate sodium  100 mg Oral BID  . enoxaparin  40 mg Subcutaneous Q24H  . FLUoxetine  40 mg Oral Daily  . furosemide  40 mg Oral BID  . gabapentin  300 mg Oral TID  . glipiZIDE  10 mg Oral Q breakfast  . insulin aspart  0-20 Units Subcutaneous TID WC  . insulin aspart  0-5 Units Subcutaneous QHS  . insulin glargine  15 Units Subcutaneous QHS  . pantoprazole  40 mg Oral Q1200  . polyethylene glycol  17 g Oral Daily  . sodium chloride  3 mL Intravenous Q12H  . terazosin  1 mg Oral QHS   Infusions:    . milrinone 0.125 mcg/kg/min (04/16/11 2335)      Objective:  Blood pressure 117/80, pulse 91, temperature 97.5 F (36.4 C), temperature source Oral, resp. rate 17, height 6' (1.829 m), weight 100.4 kg (221 lb 5.5 oz), SpO2 96.00%. Weight change:   Physical Exam: Physical exam:  Improving No resp distress  Skin:  warm and dry.  HEENT:  normal.  Neck:  supple. RIJ swan  Chest:  CTA  Cardiovascular:  exam is regular rate and rhythm. With S3  Abdominal:  nontender or distended. No masses palpated. Patient with abdominal wall edema  Extremities: no  edema. Bilateral ted hose in place Neuro: neuro grossly intact     Telemetry: SR Lab Results: Basic Metabolic Panel:  Lab 04/17/11 5784 04/16/11 0445 04/15/11 1355 04/15/11 0500 04/14/11 0500  NA 133* 134* 132* 133* 134*  K 4.9 4.8 -- -- --  CL 99 101 99 99 101  CO2 25 26 24 26 26   GLUCOSE 93 99 94 105* 76  BUN 26* 27* 23 24* 29*  CREATININE 1.50* 1.60* 1.56* 1.64* 1.68*  CALCIUM 9.1 9.0 9.2 8.6 8.6  MG -- -- -- -- --  PHOS -- -- -- -- --  Liver Function Tests: No results found for this basename: AST:5,ALT:5,ALKPHOS:5,BILITOT:5,PROT:5,ALBUMIN:5 in the last 168 hours No results found for this basename: LIPASE:5,AMYLASE:5 in the last 168 hours No results found for this basename: AMMONIA:5 in the last 168 hours CBC: No results found for this basename: WBC:5,NEUTROABS:5,HGB:5,HCT:5,MCV:5,PLT:5 in the last 168 hours Cardiac Enzymes: No results found for this basename: CKTOTAL:5,CKMB:5,CKMBINDEX:5,TROPONINI:5 in the last 168 hours BNP: No components found with this basename: POCBNP:5 CBG:  Lab 04/16/11 2201 04/16/11 1717 04/16/11 1154 04/16/11 0806 04/16/11 0318  GLUCAP 185* 129* 140* 93 144*   Microbiology: Lab Results  Component Value Date   CULT NO SALMONELLA, SHIGELLA, CAMPYLOBACTER, OR YERSINIA ISOLATED 08/15/2009   CULT NO GROWTH 5 DAYS 08/15/2009   CULT NO GROWTH 5 DAYS 08/15/2009   CULT NO GROWTH 09/30/2007   CULT NORMAL OROPHARYNGEAL FLORA 11/02/2006   No  results found for this basename: CULT:2,SDES:2 in the last 168 hours  Imaging: No results found.   ASSESSMENT:  1) Acute on chronic biventricular HF due to NICM EF 20%  2) Cardiogenic shock  - on milrinone  3) Acute respiratory failure, improving  4) DM2  5) OSA  6) A/C renal failure, stable  7) Hyponatremia  8) Hyperkalemia - requiring discontinuation of spiro on 3/28  PLAN/DISCUSSION:  Continues to improve on Milirinone. Weight and intracardiac pressures way down.  [name] stopped due to hyperkalemia.   Will stop milrinone today and cut dose to 0.134mcg/kg/min. Hold diuretics for now. If hemodynamics OK can d/c swan tomorrow. If not able to tolerate milrinone wean may have to consider home inotropes as I do not think he is candidate for advanced therapies at this point given previous compliance issues and social situation (will have to investigate this more).  Will not push b-blocker at this point.   Plan for tomorrow will be to evaluate swan numbers off inotropes and add back lasix as needed. Would keep in stepdown.  Long talk about his prognosis and need to cooperate with staff and medical recommendations.   The patient is critically ill with multiple organ systems failure and requires high complexity decision making for assessment and support, frequent evaluation and titration of therapies, application of advanced monitoring technologies and extensive interpretation of multiple databases.   Critical Care Time devoted to patient care services described in this note is 35 Minutes.    LOS: 10 days    Arvilla Meres, MD 04/17/2011, 7:47 AM

## 2011-04-17 NOTE — Progress Notes (Signed)
Patient complained of pain in the epigastric area but was painfree after passing wind .Patient  closely monitored for any complications.

## 2011-04-18 LAB — BASIC METABOLIC PANEL
CO2: 23 mEq/L (ref 19–32)
Calcium: 9.2 mg/dL (ref 8.4–10.5)
Chloride: 99 mEq/L (ref 96–112)
Creatinine, Ser: 1.27 mg/dL (ref 0.50–1.35)
Glucose, Bld: 96 mg/dL (ref 70–99)

## 2011-04-18 LAB — CARBOXYHEMOGLOBIN
Methemoglobin: 1.1 % (ref 0.0–1.5)
Total hemoglobin: 18 g/dL (ref 13.5–18.0)

## 2011-04-18 LAB — POTASSIUM: Potassium: 5.1 mEq/L (ref 3.5–5.1)

## 2011-04-18 LAB — GLUCOSE, CAPILLARY: Glucose-Capillary: 154 mg/dL — ABNORMAL HIGH (ref 70–99)

## 2011-04-18 MED ORDER — DIPHENHYDRAMINE HCL 25 MG PO CAPS
25.0000 mg | ORAL_CAPSULE | Freq: Four times a day (QID) | ORAL | Status: DC | PRN
  Administered 2011-04-18 (×2): 25 mg via ORAL
  Filled 2011-04-18 (×2): qty 1

## 2011-04-18 NOTE — Progress Notes (Signed)
Despite speaking with dietician pt is still not receiving items on tray he has requested and dietician has approved.  Spoke with ambassador at [employer] re this .

## 2011-04-18 NOTE — Progress Notes (Signed)
Pa catheter removed with no complications. New occlusive dressing applied.. Pressure held on site x 10 min. With no obvious bleeding.

## 2011-04-18 NOTE — Progress Notes (Signed)
Subjective:  Feeling better this am. Breathing improved. Has lost 31 liters of fluid.  Minor itching antecubital fossa.  No CP.  Objective:  Vital Signs in the last 24 hours: Temp:  [97.5 F (36.4 C)-99 F (37.2 C)] 97.9 F (36.6 C) (03/31 1000) Pulse Rate:  [79-101] 84  (03/31 1057) Resp:  [14-24] 16  (03/31 1000) BP: (110-131)/(66-85) 119/82 mmHg (03/31 0820) SpO2:  [96 %-97 %] 97 % (03/31 0400) Weight:  [98.431 kg (217 lb)] 98.431 kg (217 lb) (03/31 0500)  Intake/Output from previous day: 03/30 0701 - 03/31 0700 In: 1340 [P.O.:1080; I.V.:260] Out: 2850 [Urine:2850]   Physical Exam: General: Well developed, well nourished, in no acute distress. Swan in place.  Head:  Normocephalic and atraumatic. Swan. Lungs: Clear to auscultation and percussion. Heart: Normal S1 and S2.  +S3   Abdomen: soft, non-tender, positive bowel sounds. Abd wall edema.  Extremities: No clubbing or cyanosis. No edema. TED Neurologic: Alert and oriented x 3.  CVP 4-8 PA 41/16  Lab Results: No results found for this basename: WBC:2,HGB:2,PLT:2 in the last 72 hours  Basename 04/18/11 0500 04/17/11 0500  NA 130* 133*  K 5.5* 4.9  CL 99 99  CO2 23 25  GLUCOSE 96 93  BUN 25* 26*  CREATININE 1.27 1.50*   Telemetry: No VT Personally viewed.   Cardiac Studies:  EF 15%  Assessment/Plan:  1) Acute on chronic biventricular HF due to NICM EF 20%   - excellent diuresis, feeling better  - milrinone if off. Tolerating well. BP 110's  - Coreg 3.125 BID, not pushing   - Hydral 10 PO Q8  - Dig 0.25 daily  - Benazepril 40 QD  - Lasix 40 PO BID  - Remove Swan  Transfer to 4700  2) Cardiogenic shock   - improved.   3) Acute respiratory failure, improving   - resolved  4) DM2   - stable  5) OSA   - CPAP 6) A/C renal failure, stable   7) Hyponatremia   - 130 stable  8) Hyperkalemia - requiring discontinuation of spiro on 3/28  - 5.5 this am. Will recheck.   Adjusted diet to no salt  added.  Prior notes reviewed.  Complex medical issues, review of PA cath data.    [name] 04/18/2011, 11:27 AM

## 2011-04-18 NOTE — Progress Notes (Signed)
Report called to 4700.  Pt transferred per wheelchair.

## 2011-04-18 NOTE — Progress Notes (Signed)
Pt. Has already placed himself on CPAP. CPAP is set at 10cmH2O via nasal mask. Pt. Is tolerating CPAP well at this time. All vitals are within normal limits.

## 2011-04-19 DIAGNOSIS — I5043 Acute on chronic combined systolic (congestive) and diastolic (congestive) heart failure: Secondary | ICD-10-CM

## 2011-04-19 DIAGNOSIS — R570 Cardiogenic shock: Secondary | ICD-10-CM

## 2011-04-19 LAB — GLUCOSE, CAPILLARY
Glucose-Capillary: 93 mg/dL (ref 70–99)
Glucose-Capillary: 117 mg/dL — ABNORMAL HIGH (ref 70–99)
Glucose-Capillary: 103 mg/dL — ABNORMAL HIGH (ref 70–99)

## 2011-04-19 LAB — BASIC METABOLIC PANEL
BUN: 34 mg/dL — ABNORMAL HIGH (ref 6–23)
Chloride: 102 mEq/L (ref 96–112)
GFR calc Af Amer: 59 mL/min — ABNORMAL LOW (ref >90)
Glucose, Bld: 82 mg/dL (ref 70–99)
Potassium: 4.9 mEq/L (ref 3.5–5.1)
Sodium: 136 mEq/L (ref 135–145)

## 2011-04-19 MED ORDER — INSULIN ASPART 100 UNIT/ML ~~LOC~~ SOLN: 0.0000 [IU] | Freq: Three times a day (TID) | SUBCUTANEOUS | Status: DC

## 2011-04-19 NOTE — Progress Notes (Signed)
RT placed patient on cpap 10cmH20 with 3lpm 02 bleed in. Patient is tolerating cpap well at this time. RT will continue to monitor. 

## 2011-04-19 NOTE — Progress Notes (Signed)
04/19/11 1600 UR Completed. Tayley Mudrick, RN, BSN Nurse Case Manager [phone] 

## 2011-04-19 NOTE — Progress Notes (Signed)
Subjective:   46 y/o male with HTN and DM2 with longstanding HF due to NICM (probably ETOH) EF 20%. Now with NYHA Class 4 symptoms and significant volume overload. Diuresis sluggish with worsening renal function. Previous ETOH use but none currently. Non-smoker. Cath 2008 40% LCX.   Underwent RHC on 04/12/11  RA = 22 with steep y-descents  RV = 45/12/25  PA = 51/25 (34)  PCW = 18-20  Fick cardiac output/index = 3.1/1.4  Thermo CO/CI = 3.6/1.6  PVR = 4.8 Woods  O2 sat = 85% (after sedation)  PA sat = 31%, 36%  LE dopplers negative.   Echo: LVEF 15%. RV moderately dilated with mildly reduced systolic function. RA and LA mildly dilated. PAPP 52 mmHG   Milrinone stopped over the weekend. Swan pulled yesterday. Final numbers were reported all over the place. Best I can tell. CVP was 4-8 PA 41/16   Co-ox was 71%. I/O negative but weight up 2 pounds (? different scale)  Feels much better. Ambulating hall with only mild dyspnea. No orthopnea, PND or edema.    Intake/Output Summary (Last 24 hours) at 04/19/11 0758 Last data filed at 04/19/11 0629  Gross per 24 hour  Intake   1653 ml  Output   3450 ml  Net  -1797 ml    Current meds:    . amitriptyline  10 mg Oral QHS  . aspirin EC  81 mg Oral Daily  . atorvastatin  20 mg Oral q1800  . benazepril  40 mg Oral Daily  . carvedilol  3.125 mg Oral BID WC  . digoxin  0.25 mg Oral Daily  . docusate sodium  100 mg Oral BID  . enoxaparin  40 mg Subcutaneous Q24H  . FLUoxetine  40 mg Oral Daily  . furosemide  40 mg Oral BID  . gabapentin  300 mg Oral TID  . glipiZIDE  10 mg Oral Q breakfast  . hydrALAZINE  10 mg Oral Q8H  . insulin aspart  0-20 Units Subcutaneous TID WC  . insulin aspart  0-5 Units Subcutaneous QHS  . insulin glargine  15 Units Subcutaneous QHS  . pantoprazole  40 mg Oral Q1200  . polyethylene glycol  17 g Oral Daily  . sodium chloride  3 mL Intravenous Q12H  . terazosin  1 mg Oral QHS  . DISCONTD: insulin aspart   0-20 Units Subcutaneous TID WC   Infusions:    . sodium chloride 20 mL/hr at 04/18/11 0829     Objective:  Blood pressure 114/76, pulse 86, temperature 97 F (36.1 C), temperature source Oral, resp. rate 17, height 6' (1.829 m), weight 97.796 kg (215 lb 9.6 oz), SpO2 100.00%. Weight change: -1.73 kg (-3 lb 13 oz)  Physical Exam: Physical exam:  Improving No resp distress  Skin:  warm and dry.  HEENT:  normal.  Neck:  supple. No JVD. Chest:  CTA  Cardiovascular:  exam is regular rate and rhythm. With S3  Abdominal:  nontender or distended. No masses palpated. Patient with abdominal wall edema  Extremities: no  edema. Bilateral ted hose in place Neuro: neuro grossly intact     Telemetry: SR Lab Results: Basic Metabolic Panel:  Lab 04/19/11 1610 04/18/11 1400 04/18/11 0500 04/17/11 0500 04/16/11 0445 04/15/11 1355  NA 136 -- 130* 133* 134* 132*  K 4.9 5.1 -- -- -- --  CL 102 -- 99 99 101 99  CO2 25 -- 23 25 26 24   GLUCOSE 82 -- 96  93 99 94  BUN 34* -- 25* 26* 27* 23  CREATININE 1.62* -- 1.27 1.50* 1.60* 1.56*  CALCIUM 9.3 -- 9.2 9.1 9.0 9.2  MG -- -- -- -- -- --  PHOS -- -- -- -- -- --   Liver Function Tests: No results found for this basename: AST:5,ALT:5,ALKPHOS:5,BILITOT:5,PROT:5,ALBUMIN:5 in the last 168 hours No results found for this basename: LIPASE:5,AMYLASE:5 in the last 168 hours No results found for this basename: AMMONIA:5 in the last 168 hours CBC: No results found for this basename: WBC:5,NEUTROABS:5,HGB:5,HCT:5,MCV:5,PLT:5 in the last 168 hours Cardiac Enzymes: No results found for this basename: CKTOTAL:5,CKMB:5,CKMBINDEX:5,TROPONINI:5 in the last 168 hours BNP: No components found with this basename: POCBNP:5 CBG:  Lab 04/18/11 1622 04/18/11 1323 04/18/11 0837 04/17/11 2146 04/17/11 1539  GLUCAP 166* 98 154* 206* 95   Microbiology: Lab Results  Component Value Date   CULT NO SALMONELLA, SHIGELLA, CAMPYLOBACTER, OR YERSINIA ISOLATED 08/15/2009    CULT NO GROWTH 5 DAYS 08/15/2009   CULT NO GROWTH 5 DAYS 08/15/2009   CULT NO GROWTH 09/30/2007   CULT NORMAL OROPHARYNGEAL FLORA 11/02/2006   No results found for this basename: CULT:2,SDES:2 in the last 168 hours  Imaging: No results found.   ASSESSMENT:  1) Acute on chronic biventricular HF due to NICM EF 20%  2) Cardiogenic shock  - on milrinone  3) Acute respiratory failure, improving  4) DM2  5) OSA  6) A/C renal failure, stable  7) Hyponatremia  8) Hyperkalemia - requiring discontinuation of spiro on 3/28  PLAN/DISCUSSION:  Overall much improved. Though I worry he may struggle off inotropes. Will watch for 24 more hours of inotropes and plan for d/c in am if he is stable on oral regimen. Add imdur 30. Continue HF education. Will keep lasix at 40 bid for now (was on 80 bid on admit)   LOS: 12 days    Arvilla Meres, MD 04/19/2011, 7:58 AM

## 2011-04-20 ENCOUNTER — Other Ambulatory Visit: Payer: Self-pay | Admitting: Internal Medicine

## 2011-04-20 DIAGNOSIS — I5043 Acute on chronic combined systolic (congestive) and diastolic (congestive) heart failure: Secondary | ICD-10-CM

## 2011-04-20 DIAGNOSIS — G629 Polyneuropathy, unspecified: Secondary | ICD-10-CM

## 2011-04-20 LAB — BASIC METABOLIC PANEL
BUN: 33 mg/dL — ABNORMAL HIGH (ref 6–23)
CO2: 25 mEq/L (ref 19–32)
Calcium: 9.1 mg/dL (ref 8.4–10.5)
Chloride: 100 mEq/L (ref 96–112)
Creatinine, Ser: 1.63 mg/dL — ABNORMAL HIGH (ref 0.50–1.35)

## 2011-04-20 LAB — GLUCOSE, CAPILLARY: Glucose-Capillary: 96 mg/dL (ref 70–99)

## 2011-04-20 MED ORDER — DIGOXIN 250 MCG PO TABS: 0.2500 mg | ORAL_TABLET | Freq: Every day | ORAL | Status: DC

## 2011-04-20 MED ORDER — FUROSEMIDE 80 MG PO TABS: 40.0000 mg | ORAL_TABLET | Freq: Two times a day (BID) | ORAL | Status: DC

## 2011-04-20 MED ORDER — CARVEDILOL 3.125 MG PO TABS: 3.1250 mg | ORAL_TABLET | Freq: Two times a day (BID) | ORAL | Status: DC

## 2011-04-20 MED ORDER — HYDRALAZINE HCL 10 MG PO TABS: 10.0000 mg | ORAL_TABLET | Freq: Three times a day (TID) | ORAL | Status: DC

## 2011-04-20 NOTE — Progress Notes (Addendum)
Clinical Social Worker will sign off for now as social work intervention is no longer needed. Please consult us again if new need arises.   Demeshia Sherburne, MSW, LCSWA [phone] 

## 2011-04-20 NOTE — Progress Notes (Signed)
IV d/c'd.  Tele d/c'd.  Pt d/c'd to home.  Home meds and d/c instructions have been discussed and reviewed with pt.  Pt denies any questions or concerns at this time.  Pt leaving unit wheelchair and appears in no acute distress. Nino Glow RN

## 2011-04-20 NOTE — Discharge Instructions (Signed)
Heart Failure Heart failure (HF) means your heart has trouble pumping blood. The blood is not circulated very well in your body because your heart is weak. HF may cause blood to back up into your lungs. This is commonly called "fluid in the lungs." HF may also cause your ankles and legs to puff up (swell). It is important to take good care of yourself when you have HF. HOME CARE Medicine  Take your medicine as told by your doctor.   Do not stop taking your medicine unless told to by your doctor.   Be sure to get your medicine refilled before it runs out.   Do not skip any doses of medicine.   Tell your doctor if you cannot afford your medicine.   Keep a list of all the medicine you take. This should include the name, how much you take, and when you take it.   Ask your doctor if you have any questions about your medicine. Do not take over-the-counter medicine unless your doctor says it is okay.  What you eat  Do not drink alcohol unless your doctor says it is okay.   Avoid food that is high in fat. Avoid foods fried in oil or made with fat.   Eat a healthy diet. A dietitian can help you with healthy food choices.   Limit how much salt you eat. Do not eat more than 1500 milligrams (mg) of salt (sodium) a day.   Do not add salt to your food.   Do not eat food made with a lot of salt. Here are some examples:   Canned vegetables.   Canned soups.   Canned drinks.   Hot dogs.   Fast food.   Pizza.   Chips.  Check your weight  Weigh yourself every morning. You should do this after you pee (urinate) and before you eat breakfast.   Wear the same amount of clothes each time you weigh yourself.   Write down your weight every day. Tell your doctor if you gain 3 lb/1.4 kg or more in 1 day or 5 lb/2.3 kg in a week.  Blood pressure monitoring  Buy a home blood pressure cuff.   Check your blood pressure as told by your doctor. Write down your blood pressure numbers on a sheet  of paper.   Bring your blood pressure numbers to your doctor visits.  Smoking  Smoking is bad for your heart.   Ask your doctor how to stop smoking.  Exercise  Talk to your doctor about exercise.   Ask how much exercise is right for you.   Exercise as much as you can. Stop if you feel tired, have problems breathing, or have chest pain.  Keep all your doctor appointments. GET HELP RIGHT AWAY IF:   You have trouble breathing.   You have a cough that does not go away.   You cannot sleep because you have trouble breathing.   You gain 3 lb/1.4 kg or more in 1 day or 5 lb/2.3 kg in a week.   You have puffy ankles or legs.   You have an enlarged (bloated) belly (abdomen).   You pass out (faint).   You have really bad chest pain or pressure. This includes pain or pressure in your:   Arms.   Jaw.   Neck.   Back.  If you have any of the above problems, call your local emergency services (911 in [place] Do not drive yourself to the hospital. MAKE   SURE YOU:   Understand these instructions.   Will watch your condition.   Will get help right away if you are not doing well or get worse.  Document Released: 10/14/2007 Document Revised: 12/24/2010 Document Reviewed: 05/07/2008 Exit[employer] Patient Information 2012 [employer]. 

## 2011-04-20 NOTE — Discharge Summary (Signed)
Advanced Heart Failure Team  Discharge Summary   Patient ID: [name] MRN: [id number], DOB/AGE: 1965-01-24 46 y.o. Admit date: 04/07/2011 D/C date:     04/20/2011   PCP: Dr. Loistine Chance  Primary Cardiologist: Dr. Olga Millers  Primary Electrophysiologist: Dr. Lewayne Bunting   Primary Discharge Diagnoses:  1) Acute on chronic biventricular HF due to NICM EF 20%  2) Cardiogenic shock       - requiring milrinone  3) Acute respiratory failure, improving  4) DM2  5) OSA  6) A/C renal failure, stable  7) Hyponatremia  8) Hyperkalemia - requiring discontinuation of spiro on 3/28  Secondary Discharge Diagnoses:  1) PUD 2) History of alcohol abuse  Hospital Course:  [name] is a 46 y/o male with HTN and DM2 with longstanding HF due to NICM (probably ETOH), EF 20%.  Cath in 2008 showed left circumflex with 40% stenosis.  He was recently discharged from the hospital for treatment of significant volume overload.  He had follow up with Tereso Newcomer, PA at [facility] and he was felt to have increased lower extremity edema with NYHA Class 4 symptoms.  He was admitted for IV diuresis.  CXR showed cardiomegaly and interstitial edema.  Initially received high dose IV lasix at 160 mg BID.  His diuresis was sluggish and renal function began to slowly decline (Cr 1.35 ->1.88).  The Heart Failure team was consulted for further management. Low output heart failure was suspected so therefore right heart catheterization was scheduled.    Findings showed cardiogenic shock with biventricular failure (R>L) and possible restrictive physiology.  RA = 22 with steep y-descents, RV = 45/12/25, PA = 51/25 (34), PCW = 18-20, Fick cardiac output/index = 3.1/1.4, Thermo CO/CI = 3.6/1.6, PVR = 4.8 Woods O2 sat = 85% (after sedation), PA sat = 31%, 36%.  Milrinone was initiated for low output physiology.  [name] was left in RIJ for hemodynamic monitoring and he was transferred to the CCU.  Echo was done to re-evaluate  RV function this showed LVEF 15%. RV moderately dilated with mildly reduced systolic function. RA and LA mildly dilated. PAPP 52 mmHG.LE dopplers ordered (negative for DVT).  He improved quickly with milrinone and diuresis with significant improvement particularly in his RV hemodynamics. Prior to removing Swan hemodynamics showed CVP 8, PA 37/17, PCWP 14, CO/CI 5.2/2.3, PVR 1.6, and Co-ox 76%. Digoxin started at this time and afterload reduction with ACE-I was titrated.  Spironolactone stopped due to hyperkalemia..  Milrinone began to be weaned to off on 3/30 and lasix transitioned to oral regimen.  [name] was pulled 3/31 and he was transferred to telemetry.  Beta blocker was not titrated due to cardiogenic shock requiring milrinone.  He diuresed a total of 33 liters.   (Weight loss from 244 to 214 pounds)  He was ambulating in the halls without difficulty.  On day of discharge he is stable for home and overall much improved. Though I worry he may struggle off inotropes.  Will have close outpatient follow up to monitor.      He has history of headaches/migraines therefore will hold off on initiation of imdur at this time as it may make his symptoms worse. Dietary consult obtained during admission and greatly appreciated.    Discharge Weight Range: 212-215 Discharge Vitals: Blood pressure 110/80, pulse 90, temperature 96.9 F (36.1 C), temperature source Oral, resp. rate 18, height 6' (1.829 m), weight 97.433 kg (214 lb 12.8 oz), SpO2 100.00%.  Physical exam:  General: WDWN, No resp distress  Skin: warm and dry.  HEENT: normal.  Neck: supple. No JVD.  Chest: CTA  Cardiovascular: exam is regular rate and rhythm. With S3  Abdominal: nontender or distended. No masses palpated. Patient with abdominal wall edema  Extremities: no edema. Bilateral ted hose in place  Neuro: neuro grossly intact    Labs: Lab Results  Component Value Date   WBC 5.1 04/07/2011   HGB 14.1 04/07/2011   HCT 41.6  04/07/2011   MCV 86.8 04/07/2011   PLT 222 04/07/2011     Lab 04/20/11 0610  NA 135  K 4.8  CL 100  CO2 25  BUN 33*  CREATININE 1.63*  CALCIUM 9.1  PROT --  BILITOT --  ALKPHOS --  ALT --  AST --  GLUCOSE 78   No results found for this basename: CKTOTAL:4,CKMB:4,TROPONINI:4 in the last 72 hours Lab Results  Component Value Date   CHOL 211* 08/24/2010   HDL 39* 08/24/2010   LDLCALC 152* 08/24/2010   TRIG 101 08/24/2010   BNP (last 3 results)  Basename 03/15/11 1242 05/13/10 1450  PROBNP 4353.0* 445.0*    Diagnostic Studies/Procedures   Dg Chest 2 View  04/08/2011  *RADIOLOGY REPORT*  Clinical Data: Shortness of breath, swelling of the hands and feet  CHEST - 2 VIEW  Comparison: Portable chest x-ray of 03/15/2011  Findings: There is cardiomegaly present with mild interstitial edema.  No effusion is seen.  A permanent pacemaker remains with AICD lead.  No bony abnormality is seen.  IMPRESSION: Cardiomegaly and interstitial edema.  Original Report Authenticated By: Juline Patch, M.D.   ECHO 04/13/11  Left ventricle: The cavity size was moderately dilated. Wall thickness was normal. The estimated ejection fraction was 15%. Diffuse hypokinesis. Doppler parameters are consistent with restrictive physiology, indicative of decreased left ventricular diastolic compliance and/or increased left atrial pressure. Doppler parameters are consistent with high ventricular filling pressure. Left atrium: The atrium was mildly dilated. Right ventricle: The cavity size was moderately dilated.  Systolic function was mildly reduced. Right atrium: The atrium was mildly dilated. Pulmonary arteries: Systolic pressure was moderately increased. PA peak pressure: 52mm Hg (S). Pericardium, extracardiac: A trivial pericardial effusion was identified.  Right Heart Cath 04/12/11  RA = 22 with steep y-descents  RV = 45/12/25  PA = 51/25 (34)  PCW = 18-20  Fick cardiac output/index = 3.1/1.4  Thermo CO/CI =  3.6/1.6  PVR = 4.8 Woods  O2 sat = 85% (after sedation)  PA sat = 31%, 36%   Discharge Medications   Medication List  As of 04/20/2011 11:47 PM   STOP taking these medications         metoprolol tartrate 25 MG tablet      spironolactone 25 MG tablet         TAKE these medications         amitriptyline 10 MG tablet   Commonly known as: ELAVIL   Take 10 mg by mouth at bedtime.      benazepril 40 MG tablet   Commonly known as: LOTENSIN   Take 40 mg by mouth daily.      carvedilol 3.125 MG tablet   Commonly known as: COREG   Take 1 tablet (3.125 mg total) by mouth 2 (two) times daily with a meal.      digoxin 0.25 MG tablet   Commonly known as: LANOXIN   Take 1 tablet (0.25 mg total) by mouth daily.  FLUoxetine 40 MG capsule   Commonly known as: PROZAC   Take 40 mg by mouth daily.      furosemide 80 MG tablet   Commonly known as: LASIX   Take 0.5 tablets (40 mg total) by mouth 2 (two) times daily.      gabapentin 300 MG capsule   Commonly known as: NEURONTIN   Take 300 mg by mouth 3 (three) times daily.      glipiZIDE 10 MG tablet   Commonly known as: GLUCOTROL   Take 10 mg by mouth daily with breakfast.      hydrALAZINE 10 MG tablet   Commonly known as: APRESOLINE   Take 1 tablet (10 mg total) by mouth every 8 (eight) hours.      insulin glargine 100 UNIT/ML injection   Commonly known as: LANTUS   Inject 15 Units into the skin at bedtime.      omeprazole 20 MG capsule   Commonly known as: PRILOSEC   Take 20 mg by mouth daily.      rosuvastatin 10 MG tablet   Commonly known as: CRESTOR   Take 10 mg by mouth daily.      SUMAtriptan 50 MG tablet   Commonly known as: IMITREX   Take 50 mg by mouth See admin instructions. Take one tablet at the first symptom of a migraine headache.  may repeat a second dose in 1hr if headache persists.      terazosin 1 MG capsule   Commonly known as: HYTRIN   Take 1 mg by mouth at bedtime.      zolpidem 5 MG tablet     Commonly known as: AMBIEN   Take 5 mg by mouth at bedtime as needed. For insomnia            Disposition   The patient will be discharged in stable condition to home. Discharge Orders    Future Appointments: Provider: Department: Dept Phone: Center:   04/27/2011 11:00 AM [facility] [phone] None   05/31/2011 3:45 PM Almyra Deforest, MD [facility] [phone] [facility]   07/06/2011 11:00 AM Marinus Maw, MD [facility] [phone] [facility]     Future Orders Please Complete By Expires   Diet - low sodium heart healthy      Increase activity slowly      Heart Failure patients record your daily weight using the same scale at the same time of day      (HEART FAILURE PATIENTS) Call MD:  Anytime you have any of the following symptoms: 1) 3 pound weight gain in 24 hours or 5 pounds in 1 week 2) shortness of breath, with or without a dry hacking cough 3) swelling in the hands, feet or stomach 4) if you have to sleep on extra pillows at night in order to breathe.      Beta Blocker already ordered      ACE Inhibitor / ARB already ordered        Follow-up Information    Follow up with Arvilla Meres, MD on 04/27/2011. (at 11a    [employer] Code [id number]))    Contact information:   Heart Failure Clinic at [facility] [address] [place] [address] [phone]            Duration of Discharge Encounter: Greater than 35 minutes   Signed,

## 2011-04-23 ENCOUNTER — Other Ambulatory Visit: Payer: Self-pay | Admitting: Internal Medicine

## 2011-04-23 ENCOUNTER — Other Ambulatory Visit: Payer: Self-pay | Admitting: Physician Assistant

## 2011-04-27 ENCOUNTER — Ambulatory Visit (HOSPITAL_COMMUNITY)
Admission: RE | Admit: 2011-04-27 | Discharge: 2011-04-27 | Disposition: A | Discharge status: Home / Self Care | Payer: Medicare Other | Source: Ambulatory Visit | Attending: Internal Medicine | Admitting: Internal Medicine

## 2011-04-27 VITALS — BP 110/66 | HR 78 | Wt 235.0 lb

## 2011-04-27 DIAGNOSIS — I5022 Chronic systolic (congestive) heart failure: Secondary | ICD-10-CM | POA: Insufficient documentation

## 2011-04-27 LAB — BASIC METABOLIC PANEL
CO2: 20 mEq/L (ref 19–32)
Calcium: 9.3 mg/dL (ref 8.4–10.5)
Chloride: 108 mEq/L (ref 96–112)
Glucose, Bld: 119 mg/dL — ABNORMAL HIGH (ref 70–99)
Sodium: 139 mEq/L (ref 135–145)

## 2011-04-27 NOTE — Progress Notes (Signed)
PCP: Dr. Loistine Chance  Primary Cardiologist: Dr. Olga Millers  Primary Electrophysiologist: Dr. Lewayne Bunting   Discharge Weight Range: 212-215  HPI:  [name] is a 46 y/o male with HTN and DM2 with longstanding HF due to NICM (probably ETOH), EF 20%. Cath in 2008 showed left circumflex with 40% stenosis.  Admitted 03/2010 with NYHA Class IV symptoms, requiring milrinone therapy and IV lasix.  Cath 04/13/11: cardiogenic shock with biventricular failure (R>L) and possible restrictive physiology. RA = 22 with steep y-descents, RV = 45/12/25, PA = 51/25 (34), PCW = 18-20, Fick cardiac output/index = 3.1/1.4, Thermo CO/CI = 3.6/1.6, PVR = 4.8 Woods O2 sat = 85% (after sedation), PA sat = 31%, 36%. Milrinone was initiated for low output physiology. [name] was left in RIJ for hemodynamic monitoring and he was transferred to the CCU. Echo was done to re-evaluate RV function this showed LVEF 15%. RV moderately dilated with mildly reduced systolic function. RA and LA mildly dilated. PAPP 52 mmHG.LE dopplers ordered (negative for DVT).  Digoxin, Ace-I and beta blocker started. Spironolactone stopped due to hyperkalemia.  Diuresed 33 liters.  Discharge weight 214 pounds.      Returns for post hospital follow up today with [name] from [place].  He feels tired.  Says breathing is ok.  He had not been weighing himself at home because he gets up late.  He is drinking 1-2 liters of fluid a day.  Eating all day, says he is not eating a lot of sodium though.  He says he breathing is better than when he went into the hospital.  Can walk about a block before having to rest.  No orthopnea/PND.  Mild abdominal distention.  No chest pain.  No HA.  No dizziness/syncope.  Compliant with medications.  He was suppose to stop his spironolactone due to hyperkalemia in the hospital but he has continued to take this.    ROS: All systems negative except as listed in HPI, PMH and Problem List.  Past Medical History  Diagnosis Date  .  Hypertension   . 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 History of peptic ulcer disease     EGD 09/2007 shows prox gastric ulcer with black eschar, treated with PPI  . Depression   . History of alcohol abuse     quit approx 2011-2012  . Angina   . Shortness of breath   . OSA (obstructive sleep apnea)     sleeps with oxygen  . Cardiomyopathy   . ICD (implantable cardiac defibrillator) in place   . Diabetes mellitus     insulin dependent  . GERD (gastroesophageal reflux disease)     Current Outpatient Prescriptions  Medication Sig Dispense Refill  . amitriptyline (ELAVIL) 10 MG tablet Take 10 mg by mouth at bedtime.      . benazepril (LOTENSIN) 40 MG tablet Take 40 mg by mouth daily.      . carvedilol (COREG) 3.125 MG tablet Take 1 tablet (3.125 mg total) by mouth 2 (two) times daily with a meal.  60 tablet  6  . digoxin (LANOXIN) 0.25 MG tablet Take 1 tablet (0.25 mg total) by mouth daily.  30 tablet  6  . FLUoxetine (PROZAC) 40 MG capsule Take 40 mg by mouth daily.      . furosemide (LASIX) 80 MG tablet Take 0.5 tablets (40 mg total) by mouth 2 (two)  times daily.      Marland Kitchen gabapentin (NEURONTIN) 300 MG capsule TAKE ONE CAPSULE BY MOUTH 3 TIMES A DAY  90 capsule  3  . glipiZIDE (GLUCOTROL) 10 MG tablet Take 10 mg by mouth daily with breakfast.       . hydrALAZINE (APRESOLINE) 10 MG tablet Take 1 tablet (10 mg total) by mouth every 8 (eight) hours.  90 tablet  6  . insulin glargine (LANTUS) 100 UNIT/ML injection Inject 15 Units into the skin at bedtime.      Marland Kitchen omeprazole (PRILOSEC) 20 MG capsule Take 20 mg by mouth daily.      . rosuvastatin (CRESTOR) 10 MG tablet Take 10 mg by mouth daily.      Marland Kitchen spironolactone (ALDACTONE) 25 MG tablet Take 25 mg by mouth daily.      . SUMAtriptan (IMITREX) 50 MG tablet Take 50 mg by mouth See admin instructions. Take one tablet at the first symptom of a  migraine headache.  may repeat a second dose in 1hr if headache persists.      Marland Kitchen terazosin (HYTRIN) 1 MG capsule Take 1 mg by mouth at bedtime.      Marland Kitchen zolpidem (AMBIEN) 5 MG tablet Take 5 mg by mouth at bedtime as needed. For insomnia      . DISCONTD: gabapentin (NEURONTIN) 300 MG capsule Take 300 mg by mouth 3 (three) times daily.      Marland Kitchen DISCONTD: metoprolol tartrate (LOPRESSOR) 25 MG tablet Take 1 tablet (25 mg total) by mouth 2 (two) times daily.  60 tablet  1  . DISCONTD: traZODone (DESYREL) 25 mg TABS Take 0.5 tablets (25 mg total) by mouth at bedtime.  30 tablet  1     PHYSICAL EXAM: Filed Vitals:   04/27/11 1054  BP: 110/66  Pulse: 78  Weight: 235 lb (106.595 kg)  SpO2: 100%    General: Well appearing. No resp difficulty  HEENT: normal  Neck: supple. JVP 11-12. +HJR.  Carotids 2+ bilaterally; no bruits. No lymphadenopathy or thryomegaly appreciated.  Cor: PMI normal. Regular rate & rhythm. No rubs, gallops or murmurs.  Lungs: crackles at bases.    Abdomen: soft, nontender, mildly distend. No hepatosplenomegaly. No bruits or masses. Good bowel sounds.  Extremities: no cyanosis, clubbing, rash, trace to 1+ edema  Neuro: alert & orientedx3, cranial nerves grossly intact. Moves all 4 extremities w/o difficulty. Affect pleasant.     ASSESSMENT & PLAN:

## 2011-04-27 NOTE — Assessment & Plan Note (Addendum)
[name] is volume overloaded on exam, NYHA III.  He is not weighing at home, trying to follow a low sodium diet but he is not cooking the food.  Feel he has at least 10 pounds of fluid on board.  Will give metolazone 2.5 mg today and increase lasix 80 mg BID (original dose prior to hospitalization).  Will check labs today, especially as he has continued on spironolactone, will reassess need to discontinue once labs are reviewed.  Will not titrate other meds at this time while trying to remove fluid.  Had a lengthy discussion regarding need for daily weights, fluid restrictions and low sodium diet.  The patient and [name] from [facility] voiced understanding.  He will record daily weights and bring list to clinic visits.  He will call if his weight does not start coming down, otherwise follow up 1 week.

## 2011-04-27 NOTE — Patient Instructions (Signed)
Take metolazone 2.5 mg (1/2 tablet) today.  Increase lasix 80 mg (1 tablet) twice daily.    Labs today.  Follow up 1 week.   Do the following things EVERYDAY: 1) Weigh yourself in the morning before breakfast. Write it down and keep it in a log. 2) Take your medicines as prescribed 3) Eat low salt foods--Limit salt (sodium) to 2000mg  per day.  4) Stay as active as you can everyday 5) Drink less than 1.5 liters per day.

## 2011-04-27 NOTE — Assessment & Plan Note (Signed)
Check BMET today 

## 2011-04-28 ENCOUNTER — Other Ambulatory Visit: Payer: Self-pay | Admitting: *Deleted

## 2011-04-28 DIAGNOSIS — G4700 Insomnia, unspecified: Secondary | ICD-10-CM

## 2011-04-28 MED ORDER — ZOLPIDEM TARTRATE 5 MG PO TABS: 5.0000 mg | ORAL_TABLET | Freq: Every evening | ORAL | Status: DC | PRN

## 2011-04-29 ENCOUNTER — Encounter: Payer: Self-pay | Admitting: Internal Medicine

## 2011-04-29 ENCOUNTER — Ambulatory Visit (INDEPENDENT_AMBULATORY_CARE_PROVIDER_SITE_OTHER): Payer: Medicare Other | Admitting: Internal Medicine

## 2011-04-29 ENCOUNTER — Encounter: Payer: Self-pay | Admitting: Licensed Clinical Social Worker

## 2011-04-29 VITALS — BP 133/95 | HR 76 | Temp 96.9°F | Ht 72.0 in | Wt 226.1 lb

## 2011-04-29 DIAGNOSIS — R51 Headache: Secondary | ICD-10-CM

## 2011-04-29 DIAGNOSIS — I5022 Chronic systolic (congestive) heart failure: Secondary | ICD-10-CM

## 2011-04-29 DIAGNOSIS — E119 Type 2 diabetes mellitus without complications: Secondary | ICD-10-CM

## 2011-04-29 DIAGNOSIS — G4700 Insomnia, unspecified: Secondary | ICD-10-CM

## 2011-04-29 DIAGNOSIS — G4733 Obstructive sleep apnea (adult) (pediatric): Secondary | ICD-10-CM

## 2011-04-29 DIAGNOSIS — F329 Major depressive disorder, single episode, unspecified: Secondary | ICD-10-CM

## 2011-04-29 MED ORDER — FUROSEMIDE 80 MG PO TABS: 40.0000 mg | ORAL_TABLET | Freq: Two times a day (BID) | ORAL | Status: DC

## 2011-04-29 MED ORDER — ZOLPIDEM TARTRATE 5 MG PO TABS: 5.0000 mg | ORAL_TABLET | Freq: Every evening | ORAL | Status: DC | PRN

## 2011-04-29 MED ORDER — INSULIN GLARGINE 100 UNIT/ML ~~LOC~~ SOLN: 15.0000 [IU] | Freq: Every day | SUBCUTANEOUS | Status: DC

## 2011-04-29 MED ORDER — TRAMADOL HCL 50 MG PO TABS: 50.0000 mg | ORAL_TABLET | Freq: Four times a day (QID) | ORAL | Status: AC | PRN

## 2011-04-29 MED ORDER — AMITRIPTYLINE HCL 25 MG PO TABS: 25.0000 mg | ORAL_TABLET | Freq: Every day | ORAL | Status: DC

## 2011-04-29 MED ORDER — SUMATRIPTAN SUCCINATE 50 MG PO TABS: ORAL_TABLET | ORAL | Status: DC

## 2011-04-29 NOTE — Assessment & Plan Note (Signed)
Lasix dose increased to 80 mg twice a day at last cardiology visit 2 days ago but patient does not have any refills on his Lasix. Also the note mentions that patient is to be on metolazone but it's not listed on the medication list and patient does not have a prescription. He is asymptomatic today and is mildly volume overloaded on physical exam. His labs were done 2 days ago which showed a potassium of 5.1 otherwise unremarkable. He is on Aldactone as well.  Plan - Stop Aldactone today. - Send a note to to the cardiologist to confirm if patient needs to be on metolazone - Prescription of Lasix 80 mg twice a day - Patient will be seen by cardiologist again in 5-6 days

## 2011-04-29 NOTE — Assessment & Plan Note (Signed)
Patient's symptoms are atypical for migraine. The symptoms more likely suggest obstructive sleep apnea and depression as a cause of his headaches. Sumatriptan  has not worked at all for his headaches which further suggests that this may not be primarily a migraine headache   Plan - Referred for sleep study - Referred to psychiatry - Continue sumatriptan and Ultram but will be stopped once he has his obstructive sleep apnea controlled and he is on a regimen for depression - Also increased his Elavil to 25 mg each bedtime - Followup in one month - Patient is very insistent that he needs Vicodin as this helped him a lot in the hospital but I do not think that this is what he needs at this time until we have treated his obstructive sleep apnea and depression

## 2011-04-29 NOTE — Telephone Encounter (Signed)
Ambien rx called to [facility]. 

## 2011-04-29 NOTE — Assessment & Plan Note (Signed)
Patient never got referred to a psychiatrist at last visit We gave her referral again today Followup with social worker to make sure patient has an appointment with behavioral health. Patient is currently taking Prozac but it is not helping him. He is also on Elavil the dose of which is increased today for migraine prophylaxis.

## 2011-04-29 NOTE — Progress Notes (Signed)
[name] is a 46 y.o. male seen at his scheduled medical appt today.  Mr. Roehrig referred to CSW for mental health referrals.  CSW met with Mr. Vineyard and Research officer, political party.  Pt states he is currently on Prozac but feels it is not helping.  Pt states the physician does not want to try another medication, but would like pt to be referred for mental health services.  CSW discussed with pt oftentimes outpatient counseling along with medications is beneficial for depression.  Pt agreeable to outpatient counseling.  CSW provided pt with providers in his zip code, [facility] and [facility].  All agencies provide counseling as well as medication management.  Pt aware CSW is available to assist as needed.

## 2011-04-29 NOTE — Progress Notes (Signed)
46 Y/o M with pmh listed below comes for checkup. [name] from [place] is with him  Complaints of headache every morning after waking up. Frontal headache with no visual symptoms or nausea or vomiting. Also occurs multiple times during the day when he stressed out. He is able to function during the headaches but it is very uncomfortable. He has had this for years and has tried tramadol and Imitrex which has not worked. He was prescribed Vicodin in the hospital which worked very well. Complaint with medications See individual a/p for further details.   Physical exam  General Appearance:     Filed Vitals:   04/29/11 0824  BP: 133/95  Pulse: 76  Temp: 96.9 F (36.1 C)  TempSrc: Oral  Height: 6' (1.829 m)  Weight: 226 lb 1.6 oz (102.558 kg)  SpO2: 100%     Alert, cooperative, no distress, appears stated age  Head:    Normocephalic, without obvious abnormality, atraumatic  Eyes:    PERRL, conjunctiva/corneas clear, EOM's intact, fundi    benign, both eyes       Neck:   Supple, symmetrical, trachea midline, no adenopathy;       thyroid:  No enlargement/tenderness/nodules; no carotid   bruit or JVD  Lungs:     Clear to auscultation bilaterally, respirations unlabored  Chest wall:    No tenderness or deformity  Heart:    Regular rate and rhythm, S1 and S2 normal, no murmur, rub   or gallop  Abdomen:     Soft, non-tender, bowel sounds active all four quadrants,    no masses, no organomegaly  Extremities:  1-2+ edema bilaterally  Pulses:   2+ and symmetric all extremities  Skin:   Skin color, texture, turgor normal, no rashes or lesions  Neurologic:  nonfocal grossly    ROS  Constitutional: Denies fever, chills, diaphoresis, appetite change and fatigue.  Respiratory: Denies SOB, , cough, chest tightness,  and wheezing.   Cardiovascular: Denies chest pain, palpitations and leg swelling.  Gastrointestinal: Denies nausea, vomiting, abdominal pain, diarrhea, constipation, blood in stool  and abdominal distention.  Skin: Denies pallor, rash and wound.  Neurological: Denies dizziness, light-headedness, numbness

## 2011-05-06 ENCOUNTER — Ambulatory Visit (HOSPITAL_COMMUNITY)
Admission: RE | Admit: 2011-05-06 | Discharge: 2011-05-06 | Disposition: A | Discharge status: Home / Self Care | Payer: Medicare Other | Source: Ambulatory Visit | Attending: Internal Medicine | Admitting: Internal Medicine

## 2011-05-06 VITALS — BP 130/82 | HR 92 | Wt 235.5 lb

## 2011-05-06 DIAGNOSIS — I5022 Chronic systolic (congestive) heart failure: Secondary | ICD-10-CM | POA: Insufficient documentation

## 2011-05-06 MED ORDER — FUROSEMIDE 80 MG PO TABS: ORAL_TABLET | ORAL | Status: DC

## 2011-05-06 NOTE — Assessment & Plan Note (Addendum)
He continues to have increased fluid on board.  Feel this is due to dietary indiscretions although the patient is having a difficult time with this concept.  Have spent an extended amount of time (greater than 30 min) discussing diet and fluid restrictions.  He needed more education on reading labels, we started back at the basics.  I have asked him to write down questions once he gets home.  Will also discuss with [facility] if they have access to nutritionist in the outpatient setting and if so will make referral.  With his volume status being up again, give metolazone 2.5 mg daily for 2 days and increase lasix 120 mg/80 mg daily.  Patient states he will try to read and write down sodium content.  A new scale was also provided in clinic today.  He is going to weigh each morning and bring a list of weights with him.  He will continue to need close follow up and education, will see him back in 7 days.

## 2011-05-06 NOTE — Progress Notes (Signed)
PCP: Dr. Loistine Chance  Primary Cardiologist: Dr. Olga Millers  Primary Electrophysiologist: Dr. Lewayne Bunting   Discharge Weight Range: 212-215  HPI:  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 Cath in 2008 showed left circumflex with 40% stenosis.  Admitted 03/2010 with NYHA Class IV symptoms, requiring milrinone therapy and IV lasix.  Cath 04/13/11: cardiogenic shock with biventricular failure (R>L) and possible restrictive physiology. RA = 22 with steep y-descents, RV = 45/12/25, PA = 51/25 (34), PCW = 18-20, Fick cardiac output/index = 3.1/1.4, Thermo CO/CI = 3.6/1.6, PVR = 4.8 Woods O2 sat = 85% (after sedation), PA sat = 31%, 36%. Milrinone was initiated for low output physiology. [name] was left in RIJ for hemodynamic monitoring and he was transferred to the CCU. Echo was done to re-evaluate RV function this showed LVEF 15%. RV moderately dilated with mildly reduced systolic function. RA and LA mildly dilated. PAPP 52 mmHG.LE dopplers ordered (negative for DVT).  Digoxin, Ace-I and beta blocker started. Spironolactone stopped due to hyperkalemia.  Diuresed 33 liters.  Discharge weight 214 pounds.      He returns for 1 week follow up today.  He was given metolazone 1 dose last visit and increase lasix and weight down 9 pounds within 2 days but presents today with weight back up.  He says breathing is ok.  He started weighing himself at home but says his scale is broken.  No orthopnea/PND.  Continues to drink 6-8 16 oz glasses of water a day +/- a soda.  Says he is not adding salt to his diet but yesterday ate eggs with 2 slices of bacon, 2 ham sandwiches, 1 [place] sandwich and 2 slices of pepperoni pizza yesterday.  Continues to sit around most of the day.  No CP/dizziness or syncope.  +Abdominal distention.   [name] stopped due to hyperkalemia.    ROS: All systems negative except as listed in HPI, PMH and Problem List.  Past Medical History    Diagnosis Date  . Hypertension   . 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 History of peptic ulcer disease     EGD 09/2007 shows prox gastric ulcer with black eschar, treated with PPI  . Depression   . History of alcohol abuse     quit approx 2011-2012  . Angina   . Shortness of breath   . OSA (obstructive sleep apnea)     sleeps with oxygen  . Cardiomyopathy   . ICD (implantable cardiac defibrillator) in place   . Diabetes mellitus     insulin dependent  . GERD (gastroesophageal reflux disease)     Current Outpatient Prescriptions  Medication Sig Dispense Refill  . amitriptyline (ELAVIL) 25 MG tablet Take 1 tablet (25 mg total) by mouth at bedtime.  31 tablet  1  . benazepril (LOTENSIN) 40 MG tablet Take 40 mg by mouth daily.      . carvedilol (COREG) 3.125 MG tablet Take 1 tablet (3.125 mg total) by mouth 2 (two) times daily with a meal.  60 tablet  6  . digoxin (LANOXIN) 0.25 MG tablet Take 1 tablet (0.25 mg total) by mouth daily.  30 tablet  6  . FLUoxetine (PROZAC) 40 MG capsule Take 40 mg by mouth daily.      . furosemide (LASIX) 80 MG tablet Take  80 mg by mouth 2 (two) times daily.      Marland Kitchen gabapentin (NEURONTIN) 300 MG capsule TAKE ONE CAPSULE BY MOUTH 3 TIMES A DAY  90 capsule  3  . glipiZIDE (GLUCOTROL) 10 MG tablet Take 10 mg by mouth daily with breakfast.       . insulin glargine (LANTUS) 100 UNIT/ML injection Inject 15 Units into the skin at bedtime.  10 mL  3  . omeprazole (PRILOSEC) 20 MG capsule Take 20 mg by mouth daily.      . rosuvastatin (CRESTOR) 10 MG tablet Take 10 mg by mouth daily.      . SUMAtriptan (IMITREX) 50 MG tablet Take one tablet at the first symptom of a migraine headache.  may repeat a second dose in 1hr if headache persists.  20 tablet  1  . traMADol (ULTRAM) 50 MG tablet Take 1 tablet (50 mg total) by mouth every 6 (six) hours as needed for pain.  30  tablet  1  . zolpidem (AMBIEN) 5 MG tablet Take 1 tablet (5 mg total) by mouth at bedtime as needed. For insomnia  30 tablet  1  . DISCONTD: furosemide (LASIX) 80 MG tablet Take 0.5 tablets (40 mg total) by mouth 2 (two) times daily.      . hydrALAZINE (APRESOLINE) 10 MG tablet Take 1 tablet (10 mg total) by mouth every 8 (eight) hours.  90 tablet  6  . terazosin (HYTRIN) 1 MG capsule Take 1 mg by mouth at bedtime.      Marland Kitchen DISCONTD: metoprolol tartrate (LOPRESSOR) 25 MG tablet Take 1 tablet (25 mg total) by mouth 2 (two) times daily.  60 tablet  1  . DISCONTD: traZODone (DESYREL) 25 mg TABS Take 0.5 tablets (25 mg total) by mouth at bedtime.  30 tablet  1     PHYSICAL EXAM: Filed Vitals:   05/06/11 1058  BP: 130/82  Pulse: 92  Weight: 235 lb 8 oz (106.822 kg)  SpO2: 100%    General: Well appearing. No resp difficulty  HEENT: normal  Neck: supple. JVP 11-12. +HJR.  Carotids 2+ bilaterally; no bruits. No lymphadenopathy or thryomegaly appreciated.  Cor: PMI normal. Regular rate & rhythm. No rubs, gallops or murmurs.  Lungs: crackles at bases.    Abdomen: soft, nontender, mildly distend. No hepatosplenomegaly. No bruits or masses. Good bowel sounds.  Extremities: no cyanosis, clubbing, rash, trace Rt edema with Lt 1+ edema  Neuro: alert & orientedx3, cranial nerves grossly intact. Moves all 4 extremities w/o difficulty. Affect pleasant.     ASSESSMENT & PLAN:

## 2011-05-06 NOTE — Patient Instructions (Signed)
Increase lasix 1.5 tabs in the morning and 1 tab in evening  Take metolazone 1/2 tab today and tomorrow.  Follow up 1 week with labs.  Do the following things EVERYDAY: 1) Weigh yourself in the morning before breakfast. Write it down and keep it in a log. 2) Take your medicines as prescribed 3) Eat low salt foods--Limit salt (sodium) to 2000mg  per day.  4) Stay as active as you can everyday 5) Less than 2 liters of fluid a day

## 2011-05-14 ENCOUNTER — Ambulatory Visit (HOSPITAL_COMMUNITY)
Admission: RE | Admit: 2011-05-14 | Discharge: 2011-05-14 | Disposition: A | Discharge status: Home / Self Care | Payer: Medicare Other | Source: Ambulatory Visit | Attending: Internal Medicine | Admitting: Internal Medicine

## 2011-05-14 VITALS — BP 100/68 | HR 91 | Wt 231.0 lb

## 2011-05-14 DIAGNOSIS — I5022 Chronic systolic (congestive) heart failure: Secondary | ICD-10-CM

## 2011-05-14 DIAGNOSIS — I509 Heart failure, unspecified: Secondary | ICD-10-CM | POA: Insufficient documentation

## 2011-05-14 LAB — BASIC METABOLIC PANEL
BUN: 18 mg/dL (ref 6–23)
CO2: 26 mEq/L (ref 19–32)
Calcium: 9.3 mg/dL (ref 8.4–10.5)
Creatinine, Ser: 1.42 mg/dL — ABNORMAL HIGH (ref 0.50–1.35)
Glucose, Bld: 137 mg/dL — ABNORMAL HIGH (ref 70–99)

## 2011-05-14 MED ORDER — CARVEDILOL 3.125 MG PO TABS: 6.2500 mg | ORAL_TABLET | Freq: Two times a day (BID) | ORAL | Status: DC

## 2011-05-14 NOTE — Assessment & Plan Note (Addendum)
His volume status has improved today.  NYHA II.  Will continue current diuretics and increase coreg 6.25 mg BID.  Have encouraged him to continue daily weights, fluid restrictions and low sodium diet.  He will bring weight chart back to next visit in 2 weeks.  Have discussed sliding scale lasix for weight greater than 229, he voices understanding.  If weight begins to climb prior to appointment he will call for further instructions.  Check BMET today.  Will continue to follow closely for med titration and education.

## 2011-05-14 NOTE — Patient Instructions (Signed)
Increase carvidolol 6.25 mg (2 tabs) twice daily.    Follow up 2 weeks.  Labs today.    Do the following things EVERYDAY: 1) Weigh yourself in the morning before breakfast. Write it down and keep it in a log. 2) Take your medicines as prescribed 3) Eat low salt foods--Limit salt (sodium) to 2000mg  per day.  4) Stay as active as you can everyday 5) Drink less than 2 liters a day

## 2011-05-14 NOTE — Progress Notes (Signed)
PCP: Dr. Loistine Chance  Primary Cardiologist: Dr. Olga Millers  Primary Electrophysiologist: Dr. Lewayne Bunting   HPI:  Mr. 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 Cath in 2008 showed left circumflex with 40% stenosis.  Admitted 03/2010 with NYHA Class IV symptoms, requiring milrinone therapy and IV lasix.  Cath 04/13/11: cardiogenic shock with biventricular failure (R>L) and possible restrictive physiology. RA = 22 with steep y-descents, RV = 45/12/25, PA = 51/25 (34), PCW = 18-20, Fick cardiac output/index = 3.1/1.4, Thermo CO/CI = 3.6/1.6, PVR = 4.8 Woods O2 sat = 85% (after sedation), PA sat = 31%, 36%. Milrinone was initiated for low output physiology. [name] was left in RIJ for hemodynamic monitoring and he was transferred to the CCU. Echo was done to re-evaluate RV function this showed LVEF 15%. RV moderately dilated with mildly reduced systolic function. RA and LA mildly dilated. PAPP 52 mmHG.LE dopplers ordered (negative for DVT).  Digoxin, Ace-I and beta blocker started. Spironolactone stopped due to hyperkalemia.  Diuresed 33 liters.  Discharge weight 214 pounds.      [name] stopped due to hyperkalemia.    He returns for 1 week follow up today.  He feels good today.  Last visit weight elevated and lasix increased 120/80 mg BID and metolazone give.  We also discussed low sodium diet and reading labels.  He and his wife have also been doing a better job at reading labels, he is not adding any salt to his food.  He has also cut back to 2 bottles of water a day.  He is also walking 2-3 blocks a day (around 45 min) with his daughter in the morning without having to stop.  He is weighing most days, ranging 223-228.  He denies orthopnea/PND or lower extremity edema.  No chest pain.  No dizziness/syncope.  No abdominal distention.    ROS: All systems negative except as listed in HPI, PMH and Problem List.  Past Medical History  Diagnosis Date  .  Hypertension   . 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 History of peptic ulcer disease     EGD 09/2007 shows prox gastric ulcer with black eschar, treated with PPI  . Depression   . History of alcohol abuse     quit approx 2011-2012  . Angina   . Shortness of breath   . OSA (obstructive sleep apnea)     sleeps with oxygen  . Cardiomyopathy   . ICD (implantable cardiac defibrillator) in place   . Diabetes mellitus     insulin dependent  . GERD (gastroesophageal reflux disease)     Current Outpatient Prescriptions  Medication Sig Dispense Refill  . amitriptyline (ELAVIL) 25 MG tablet Take 1 tablet (25 mg total) by mouth at bedtime.  31 tablet  1  . benazepril (LOTENSIN) 40 MG tablet Take 40 mg by mouth daily.      . carvedilol (COREG) 3.125 MG tablet Take 1 tablet (3.125 mg total) by mouth 2 (two) times daily with a meal.  60 tablet  6  . digoxin (LANOXIN) 0.25 MG tablet Take 1 tablet (0.25 mg total) by mouth daily.  30 tablet  6  . FLUoxetine (PROZAC) 40 MG capsule Take 40 mg by mouth daily.      . furosemide (LASIX) 80 MG tablet 1.5 tabs in morning  and 1 tab in the evening.      . gabapentin (NEURONTIN) 300 MG capsule TAKE ONE CAPSULE BY MOUTH 3 TIMES A DAY  90 capsule  3  . glipiZIDE (GLUCOTROL) 10 MG tablet Take 10 mg by mouth daily with breakfast.       . hydrALAZINE (APRESOLINE) 10 MG tablet Take 1 tablet (10 mg total) by mouth every 8 (eight) hours.  90 tablet  6  . insulin glargine (LANTUS) 100 UNIT/ML injection Inject 15 Units into the skin at bedtime.  10 mL  3  . omeprazole (PRILOSEC) 20 MG capsule Take 20 mg by mouth daily.      . rosuvastatin (CRESTOR) 10 MG tablet Take 10 mg by mouth daily.      . SUMAtriptan (IMITREX) 50 MG tablet Take one tablet at the first symptom of a migraine headache.  may repeat a second dose in 1hr if headache persists.  20 tablet  1  . terazosin  (HYTRIN) 1 MG capsule Take 1 mg by mouth at bedtime.      Marland Kitchen zolpidem (AMBIEN) 5 MG tablet Take 1 tablet (5 mg total) by mouth at bedtime as needed. For insomnia  30 tablet  1  . DISCONTD: metoprolol tartrate (LOPRESSOR) 25 MG tablet Take 1 tablet (25 mg total) by mouth 2 (two) times daily.  60 tablet  1  . DISCONTD: traZODone (DESYREL) 25 mg TABS Take 0.5 tablets (25 mg total) by mouth at bedtime.  30 tablet  1     PHYSICAL EXAM: Filed Vitals:   05/14/11 1135  BP: 100/68  Pulse: 91  Weight: 231 lb (104.781 kg)  SpO2: 99%    General: Well appearing. No resp difficulty  HEENT: normal  Neck: supple. JVP 8-9. Carotids 2+ bilaterally; no bruits. No lymphadenopathy or thryomegaly appreciated.  Cor: PMI normal. Regular rate & rhythm. No rubs, gallops or murmurs.  Lungs: clear     Abdomen: soft, nontender, nondistend. No hepatosplenomegaly. No bruits or masses. Good bowel sounds.  Extremities: no cyanosis, clubbing, rash, trace edema  Neuro: alert & orientedx3, cranial nerves grossly intact. Moves all 4 extremities w/o difficulty. Affect pleasant.     ASSESSMENT & PLAN:

## 2011-05-18 ENCOUNTER — Ambulatory Visit (HOSPITAL_BASED_OUTPATIENT_CLINIC_OR_DEPARTMENT_OTHER): Payer: Medicare Other | Attending: Internal Medicine | Admitting: Radiology

## 2011-05-18 VITALS — Ht 72.0 in | Wt 238.0 lb

## 2011-05-18 DIAGNOSIS — G4733 Obstructive sleep apnea (adult) (pediatric): Secondary | ICD-10-CM

## 2011-05-18 DIAGNOSIS — G4761 Periodic limb movement disorder: Secondary | ICD-10-CM | POA: Insufficient documentation

## 2011-05-19 ENCOUNTER — Other Ambulatory Visit: Payer: Self-pay | Admitting: Internal Medicine

## 2011-05-19 DIAGNOSIS — E785 Hyperlipidemia, unspecified: Secondary | ICD-10-CM

## 2011-05-19 DIAGNOSIS — I10 Essential (primary) hypertension: Secondary | ICD-10-CM

## 2011-05-22 ENCOUNTER — Other Ambulatory Visit: Payer: Self-pay | Admitting: Internal Medicine

## 2011-05-22 DIAGNOSIS — R0609 Other forms of dyspnea: Secondary | ICD-10-CM

## 2011-05-22 DIAGNOSIS — G4733 Obstructive sleep apnea (adult) (pediatric): Secondary | ICD-10-CM

## 2011-05-22 DIAGNOSIS — G4761 Periodic limb movement disorder: Secondary | ICD-10-CM

## 2011-05-22 DIAGNOSIS — E119 Type 2 diabetes mellitus without complications: Secondary | ICD-10-CM

## 2011-05-22 DIAGNOSIS — R0989 Other specified symptoms and signs involving the circulatory and respiratory systems: Secondary | ICD-10-CM

## 2011-05-22 NOTE — Procedures (Signed)
NAMECALVIN, JABLONOWSKI                ACCOUNT NO.:  [id number]  MEDICAL RECORD NO.:  [id number]          PATIENT TYPE:  OUT  LOCATION:  SLEEP CENTER                 FACILITY[facility]  [facility]  PHYSICIAN:  Chanda Laperle D. Maple Hudson, MD, FCCP, FACPDATE OF BIRTH:  02-16-67  DATE OF STUDY:  05/18/2011                           NOCTURNAL POLYSOMNOGRAM  REFERRING PHYSICIAN:  Judyann Munson, MD  INDICATION FOR STUDY:  Hypersomnia with sleep apnea.  EPWORTH SLEEPINESS SCORE:  17/24.  BMI 32.3, weight 238 pounds, height 72 inches, neck 17 inches.  MEDICATIONS:  Charted and reviewed.  The baseline diagnostic NPSG on December 09, 2006, recorded AHI 12.5 per hour.  Weight was 257 pounds and Epworth sleepiness score 16/24 on that date.  SLEEP ARCHITECTURE:  Split study protocol.  During the diagnostic phase, total sleep time 125 minutes with sleep efficiency 84.7%.  Stage I was 16%, stage II 76.8%, stage III 2.4%, REM, 4.8% of total sleep time. Sleep latency 5 minutes, REM latency 133 minutes, awake after sleep onset 17 minutes.  Arousal index 41.3.  Bedtime medication: Amitriptyline, Coreg, Lasix, Ambien.  RESPIRATORY DATA:  Split study protocol.  Apnea-hypopnea index (AHI) 22.6 per hour.  A total of 47 events was scored including 1 obstructive apnea and 46 hypopneas.  Events were associated with supine sleep position.  REM AHI 40 per hour.  CPAP was then titrated to 13 CWP, AHI 0 per hour.  He wore a medium ResMed Mirage Quattro full face mask with heated humidifier and a C-Flex setting of 3.  OXYGEN DATA:  Before CPAP, snoring was moderately loud with oxygen desaturation to a nadir of 83% on room air.  With CPAP titration, mean oxygen saturation held 98.1% on room air, and snoring was prevented.  CARDIAC DATA:  The patient is noted to have an AICD implanted.  Sinus rhythm with occasional PVC and PAC.  MOVEMENT-PARASOMNIA:  During the titration phase, there were 152 limb jerks of which 10 were  associated with arousal or awakening for periodic limb movement arousal index of 3.1 per hour.  One bathroom trip.  IMPRESSIONS-RECOMMENDATIONS: 1. Moderate obstructive sleep apnea/hypopnea syndrome, AHI 22.6 per     hour with supine events.  Moderately loud snoring with oxygen     desaturation to a nadir of 83% on room air. 2. Successful CPAP titration to 13 CWP, AHI 0 per hour.  He wore a     medium ResMed Mirage Quattro full face mask with heated humidifier     and a C-Flex setting of 3. 3. Baseline diagnostic NPSG on December 09, 2006, had recorded AHI     12.5 per hour.  Body weight was 257 pounds and Epworth score 16/24     on that date. 4. Periodic limb movement was noted on the present study, only during     the titration phase.  This is a common observation that is expected     to resolve as he adjusts to a stable CPAP setting.     Rosana Farnell D. Maple Hudson, MD, [facility], FACP Diplomate, American Board of Sleep Medicine    CDY/MEDQ  D:  05/22/2011 11:23:45  T:  05/22/2011 15:41:05  Job:  [id number]

## 2011-05-26 ENCOUNTER — Encounter (HOSPITAL_COMMUNITY): Payer: Medicare Other

## 2011-05-31 ENCOUNTER — Encounter: Payer: Medicare Other | Admitting: Internal Medicine

## 2011-06-01 ENCOUNTER — Inpatient Hospital Stay (HOSPITAL_COMMUNITY): Admission: RE | Admit: 2011-06-01 | Payer: Medicare Other | Source: Ambulatory Visit

## 2011-06-01 ENCOUNTER — Other Ambulatory Visit: Payer: Self-pay | Admitting: Internal Medicine

## 2011-06-01 DIAGNOSIS — M549 Dorsalgia, unspecified: Secondary | ICD-10-CM

## 2011-06-04 ENCOUNTER — Other Ambulatory Visit: Payer: Self-pay | Admitting: Internal Medicine

## 2011-06-04 DIAGNOSIS — I10 Essential (primary) hypertension: Secondary | ICD-10-CM

## 2011-06-08 ENCOUNTER — Telehealth: Payer: Self-pay | Admitting: *Deleted

## 2011-06-08 NOTE — Telephone Encounter (Addendum)
Pt called with c/o not sleeping at night.  He is waiting for CPAP to be set up for him.   It looks like he had sleep study on 4/30.   Did you want CPAP set up for pt?? Will need a Written Rx to send to [facility].  Please give to your nurse  Pt # [phone]

## 2011-06-09 ENCOUNTER — Inpatient Hospital Stay (HOSPITAL_COMMUNITY)
Admission: AD | Admit: 2011-06-09 | Discharge: 2011-06-13 | DRG: 193 | Disposition: A | Discharge status: Home Health | Payer: Medicare Other | Source: Ambulatory Visit | Attending: Internal Medicine | Admitting: Internal Medicine

## 2011-06-09 ENCOUNTER — Inpatient Hospital Stay (HOSPITAL_COMMUNITY): Payer: Medicare Other

## 2011-06-09 ENCOUNTER — Ambulatory Visit (HOSPITAL_BASED_OUTPATIENT_CLINIC_OR_DEPARTMENT_OTHER)
Admission: RE | Admit: 2011-06-09 | Discharge: 2011-06-09 | Disposition: A | Discharge status: Home / Self Care | Payer: Medicare Other | Source: Ambulatory Visit | Attending: Internal Medicine | Admitting: Internal Medicine

## 2011-06-09 VITALS — BP 138/84 | HR 114 | Temp 98.4°F | Wt 233.8 lb

## 2011-06-09 DIAGNOSIS — I509 Heart failure, unspecified: Secondary | ICD-10-CM

## 2011-06-09 DIAGNOSIS — I5022 Chronic systolic (congestive) heart failure: Secondary | ICD-10-CM

## 2011-06-09 DIAGNOSIS — L0390 Cellulitis, unspecified: Secondary | ICD-10-CM

## 2011-06-09 DIAGNOSIS — R0989 Other specified symptoms and signs involving the circulatory and respiratory systems: Secondary | ICD-10-CM

## 2011-06-09 DIAGNOSIS — F329 Major depressive disorder, single episode, unspecified: Secondary | ICD-10-CM

## 2011-06-09 DIAGNOSIS — G43909 Migraine, unspecified, not intractable, without status migrainosus: Secondary | ICD-10-CM | POA: Diagnosis present

## 2011-06-09 DIAGNOSIS — I5023 Acute on chronic systolic (congestive) heart failure: Secondary | ICD-10-CM | POA: Diagnosis present

## 2011-06-09 DIAGNOSIS — I10 Essential (primary) hypertension: Secondary | ICD-10-CM | POA: Diagnosis present

## 2011-06-09 DIAGNOSIS — K219 Gastro-esophageal reflux disease without esophagitis: Secondary | ICD-10-CM | POA: Diagnosis present

## 2011-06-09 DIAGNOSIS — J9600 Acute respiratory failure, unspecified whether with hypoxia or hypercapnia: Secondary | ICD-10-CM

## 2011-06-09 DIAGNOSIS — E785 Hyperlipidemia, unspecified: Secondary | ICD-10-CM | POA: Diagnosis present

## 2011-06-09 DIAGNOSIS — R61 Generalized hyperhidrosis: Secondary | ICD-10-CM

## 2011-06-09 DIAGNOSIS — I428 Other cardiomyopathies: Secondary | ICD-10-CM | POA: Diagnosis present

## 2011-06-09 DIAGNOSIS — Z79899 Other long term (current) drug therapy: Secondary | ICD-10-CM

## 2011-06-09 DIAGNOSIS — F1011 Alcohol abuse, in remission: Secondary | ICD-10-CM | POA: Diagnosis present

## 2011-06-09 DIAGNOSIS — K254 Chronic or unspecified gastric ulcer with hemorrhage: Secondary | ICD-10-CM

## 2011-06-09 DIAGNOSIS — N529 Male erectile dysfunction, unspecified: Secondary | ICD-10-CM

## 2011-06-09 DIAGNOSIS — N189 Chronic kidney disease, unspecified: Secondary | ICD-10-CM

## 2011-06-09 DIAGNOSIS — F411 Generalized anxiety disorder: Secondary | ICD-10-CM | POA: Diagnosis present

## 2011-06-09 DIAGNOSIS — J189 Pneumonia, unspecified organism: Principal | ICD-10-CM

## 2011-06-09 DIAGNOSIS — Z95810 Presence of automatic (implantable) cardiac defibrillator: Secondary | ICD-10-CM

## 2011-06-09 DIAGNOSIS — M549 Dorsalgia, unspecified: Secondary | ICD-10-CM

## 2011-06-09 DIAGNOSIS — R570 Cardiogenic shock: Secondary | ICD-10-CM

## 2011-06-09 DIAGNOSIS — N179 Acute kidney failure, unspecified: Secondary | ICD-10-CM

## 2011-06-09 DIAGNOSIS — G4733 Obstructive sleep apnea (adult) (pediatric): Secondary | ICD-10-CM

## 2011-06-09 DIAGNOSIS — G629 Polyneuropathy, unspecified: Secondary | ICD-10-CM

## 2011-06-09 DIAGNOSIS — R05 Cough: Secondary | ICD-10-CM

## 2011-06-09 DIAGNOSIS — E119 Type 2 diabetes mellitus without complications: Secondary | ICD-10-CM | POA: Diagnosis present

## 2011-06-09 DIAGNOSIS — R51 Headache: Secondary | ICD-10-CM

## 2011-06-09 DIAGNOSIS — Z794 Long term (current) use of insulin: Secondary | ICD-10-CM

## 2011-06-09 LAB — URINE MICROSCOPIC-ADD ON

## 2011-06-09 LAB — CBC
HCT: 39 % (ref 39.0–52.0)
Platelets: 313 10*3/uL (ref 150–400)
RDW: 15 % (ref 11.5–15.5)
WBC: 7.8 10*3/uL (ref 4.0–10.5)

## 2011-06-09 LAB — URINALYSIS, ROUTINE W REFLEX MICROSCOPIC
Glucose, UA: 100 mg/dL — AB
Specific Gravity, Urine: 1.029 (ref 1.005–1.030)
pH: 6 (ref 5.0–8.0)

## 2011-06-09 LAB — COMPREHENSIVE METABOLIC PANEL
Albumin: 3.4 g/dL — ABNORMAL LOW (ref 3.5–5.2)
BUN: 12 mg/dL (ref 6–23)
Chloride: 102 mEq/L (ref 96–112)
Creatinine, Ser: 1.03 mg/dL (ref 0.50–1.35)
GFR calc Af Amer: >90 mL/min (ref >90)
GFR calc non Af Amer: 87 mL/min — ABNORMAL LOW (ref >90)
Total Bilirubin: 0.9 mg/dL (ref 0.3–1.2)

## 2011-06-09 LAB — GLUCOSE, CAPILLARY: Glucose-Capillary: 153 mg/dL — ABNORMAL HIGH (ref 70–99)

## 2011-06-09 LAB — MRSA PCR SCREENING: MRSA by PCR: NEGATIVE

## 2011-06-09 LAB — MAGNESIUM: Magnesium: 2.3 mg/dL (ref 1.5–2.5)

## 2011-06-09 MED ORDER — HEPARIN SODIUM (PORCINE) 5000 UNIT/ML IJ SOLN
5000.0000 [IU] | Freq: Three times a day (TID) | INTRAMUSCULAR | Status: DC
  Administered 2011-06-09 – 2011-06-13 (×10): 5000 [IU] via SUBCUTANEOUS
  Filled 2011-06-09 (×14): qty 1

## 2011-06-09 MED ORDER — ACETAMINOPHEN 325 MG PO TABS: 650.0000 mg | ORAL_TABLET | ORAL | Status: DC | PRN

## 2011-06-09 MED ORDER — GUAIFENESIN-DM 100-10 MG/5ML PO SYRP
5.0000 mL | ORAL_SOLUTION | ORAL | Status: DC | PRN
  Administered 2011-06-09 – 2011-06-12 (×4): 5 mL via ORAL
  Filled 2011-06-09 (×5): qty 5

## 2011-06-09 MED ORDER — SODIUM CHLORIDE 0.9 % IJ SOLN
3.0000 mL | Freq: Two times a day (BID) | INTRAMUSCULAR | Status: DC
  Administered 2011-06-10 – 2011-06-12 (×3): 3 mL via INTRAVENOUS

## 2011-06-09 MED ORDER — VANCOMYCIN HCL IN DEXTROSE 1-5 GM/200ML-% IV SOLN: 1000.0000 mg | Freq: Two times a day (BID) | INTRAVENOUS | Status: DC

## 2011-06-09 MED ORDER — TRAMADOL HCL 50 MG PO TABS
50.0000 mg | ORAL_TABLET | Freq: Two times a day (BID) | ORAL | Status: DC | PRN
  Administered 2011-06-09: 50 mg via ORAL
  Filled 2011-06-09 (×2): qty 1

## 2011-06-09 MED ORDER — MILRINONE IN DEXTROSE 200-5 MCG/ML-% IV SOLN
0.1250 ug/kg/min | INTRAVENOUS | Status: DC
  Administered 2011-06-09 – 2011-06-11 (×4): 0.25 ug/kg/min via INTRAVENOUS
  Filled 2011-06-09 (×4): qty 100

## 2011-06-09 MED ORDER — GABAPENTIN 600 MG PO TABS
300.0000 mg | ORAL_TABLET | Freq: Three times a day (TID) | ORAL | Status: DC
  Filled 2011-06-09 (×2): qty 0.5

## 2011-06-09 MED ORDER — INSULIN ASPART 100 UNIT/ML ~~LOC~~ SOLN
0.0000 [IU] | SUBCUTANEOUS | Status: DC
  Administered 2011-06-09: 2 [IU] via SUBCUTANEOUS

## 2011-06-09 MED ORDER — SODIUM CHLORIDE 0.9 % IV SOLN
250.0000 mL | INTRAVENOUS | Status: DC | PRN
  Administered 2011-06-09: 250 mL via INTRAVENOUS

## 2011-06-09 MED ORDER — VANCOMYCIN HCL 1000 MG IV SOLR
2000.0000 mg | Freq: Once | INTRAVENOUS | Status: AC
  Administered 2011-06-09: 2000 mg via INTRAVENOUS
  Filled 2011-06-09: qty 2000

## 2011-06-09 MED ORDER — ONDANSETRON HCL 4 MG/2ML IJ SOLN: 4.0000 mg | Freq: Four times a day (QID) | INTRAMUSCULAR | Status: DC | PRN

## 2011-06-09 MED ORDER — VANCOMYCIN HCL IN DEXTROSE 1-5 GM/200ML-% IV SOLN
1000.0000 mg | Freq: Two times a day (BID) | INTRAVENOUS | Status: AC
  Administered 2011-06-10 – 2011-06-12 (×5): 1000 mg via INTRAVENOUS
  Filled 2011-06-09 (×5): qty 200

## 2011-06-09 MED ORDER — PIPERACILLIN-TAZOBACTAM 3.375 G IVPB
3.3750 g | Freq: Three times a day (TID) | INTRAVENOUS | Status: AC
  Administered 2011-06-09 – 2011-06-12 (×8): 3.375 g via INTRAVENOUS
  Filled 2011-06-09 (×9): qty 50

## 2011-06-09 MED ORDER — AMITRIPTYLINE HCL 25 MG PO TABS
25.0000 mg | ORAL_TABLET | Freq: Every day | ORAL | Status: DC
  Administered 2011-06-09 – 2011-06-12 (×4): 25 mg via ORAL
  Filled 2011-06-09 (×5): qty 1

## 2011-06-09 MED ORDER — FLUOXETINE HCL 20 MG PO CAPS
40.0000 mg | ORAL_CAPSULE | Freq: Every day | ORAL | Status: DC
  Administered 2011-06-09 – 2011-06-12 (×4): 40 mg via ORAL
  Filled 2011-06-09 (×4): qty 2

## 2011-06-09 MED ORDER — INSULIN GLARGINE 100 UNIT/ML ~~LOC~~ SOLN
10.0000 [IU] | Freq: Every day | SUBCUTANEOUS | Status: DC
  Administered 2011-06-09: 10 [IU] via SUBCUTANEOUS

## 2011-06-09 MED ORDER — GABAPENTIN 300 MG PO CAPS
300.0000 mg | ORAL_CAPSULE | Freq: Three times a day (TID) | ORAL | Status: DC
  Administered 2011-06-09 – 2011-06-13 (×11): 300 mg via ORAL
  Filled 2011-06-09 (×14): qty 1

## 2011-06-09 MED ORDER — SODIUM CHLORIDE 0.9 % IJ SOLN: 3.0000 mL | INTRAMUSCULAR | Status: DC | PRN

## 2011-06-09 MED ORDER — INSULIN ASPART 100 UNIT/ML ~~LOC~~ SOLN: 0.0000 [IU] | Freq: Three times a day (TID) | SUBCUTANEOUS | Status: DC

## 2011-06-09 MED ORDER — HYDROCODONE-ACETAMINOPHEN 5-325 MG PO TABS
1.0000 | ORAL_TABLET | Freq: Four times a day (QID) | ORAL | Status: AC | PRN
  Administered 2011-06-09 – 2011-06-10 (×3): 2 via ORAL
  Filled 2011-06-09 (×3): qty 2

## 2011-06-09 MED ORDER — INSULIN ASPART 100 UNIT/ML ~~LOC~~ SOLN: 0.0000 [IU] | Freq: Every day | SUBCUTANEOUS | Status: DC

## 2011-06-09 MED ORDER — INSULIN ASPART 100 UNIT/ML ~~LOC~~ SOLN
0.0000 [IU] | Freq: Three times a day (TID) | SUBCUTANEOUS | Status: DC
  Administered 2011-06-10 – 2011-06-11 (×2): 2 [IU] via SUBCUTANEOUS
  Administered 2011-06-12: 3 [IU] via SUBCUTANEOUS

## 2011-06-09 MED ORDER — GLIPIZIDE ER 10 MG PO TB24
10.0000 mg | ORAL_TABLET | Freq: Every day | ORAL | Status: DC
  Administered 2011-06-10 – 2011-06-13 (×4): 10 mg via ORAL
  Filled 2011-06-09 (×5): qty 1

## 2011-06-09 NOTE — Assessment & Plan Note (Addendum)
I suspect he has low-output shock with only mild volume overload. Currently with Class IV symptoms. May also have LLL pneumonia complicating picture. Admit to stepdown, check labs, place PICC versus central line to measure co-ox and CVP. Check bcx/ua and cx, CXR. Start empiric milrinone and vanc/zosyn. Hold b-block and ACE-I for now.

## 2011-06-09 NOTE — Progress Notes (Signed)
PCP: Dr. Loistine Chance  Primary Cardiologist: Dr. Olga Millers  Primary Electrophysiologist: Dr. Lewayne Bunting   HPI:  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 Cath in 2008 showed left circumflex with 40% stenosis.  Admitted 03/2010 with NYHA Class IV symptoms, requiring milrinone therapy and IV lasix.  Cath 04/13/11: cardiogenic shock with biventricular failure (R>L) and possible restrictive physiology. RA = 22 with steep y-descents, RV = 45/12/25, PA = 51/25 (34), PCW = 18-20, Fick cardiac output/index = 3.1/1.4, Thermo CO/CI = 3.6/1.6, PVR = 4.8 Woods O2 sat = 85% (after sedation), PA sat = 31%, 36%. Milrinone was initiated for low output physiology. [name] was left in RIJ for hemodynamic monitoring and he was transferred to the CCU. Echo was done to re-evaluate RV function this showed LVEF 15%. RV moderately dilated with mildly reduced systolic function. RA and LA mildly dilated. PAPP 52 mmHG.LE dopplers ordered (negative for DVT).  Digoxin, Ace-I and beta blocker started. Diuresed 33 liters.  Discharge weight 214 pounds.      [name] stopped due to hyperkalemia.    He is here for follow up today.  Not sleeping well.  He is sleeping in a recliner, occ PND.  Increased fatigue.  Increased dyspnea with exertion. Gets hot and clammy and ends up throwing up.  Cut back fluids, eating low sodium diet.  Nonproductive cough.  +fever/chills.  Urine dark, thinks there may be some blood in it.   Not weighing at home because he is depressed.    ROS: All systems negative except as listed in HPI, PMH and Problem List.  Past Medical History  Diagnosis Date  . Hypertension   . 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 History of peptic ulcer disease     EGD 09/2007 shows prox gastric ulcer with black eschar, treated with PPI  . Depression   . History of alcohol  abuse     quit approx 2011-2012  . Angina   . Shortness of breath   . OSA (obstructive sleep apnea)     sleeps with oxygen  . Cardiomyopathy   . ICD (implantable cardiac defibrillator) in place   . Diabetes mellitus     insulin dependent  . GERD (gastroesophageal reflux disease)     Current Outpatient Prescriptions  Medication Sig Dispense Refill  . amitriptyline (ELAVIL) 25 MG tablet Take 1 tablet (25 mg total) by mouth at bedtime.  31 tablet  1  . benazepril (LOTENSIN) 40 MG tablet Take 40 mg by mouth daily.      . carvedilol (COREG) 3.125 MG tablet Take 2 tablets (6.25 mg total) by mouth 2 (two) times daily with a meal.  60 tablet  6  . CRESTOR 10 MG tablet TAKE 1 TABLET BY MOUTH EVERY DAY  30 tablet  3  . digoxin (LANOXIN) 0.25 MG tablet Take 1 tablet (0.25 mg total) by mouth daily.  30 tablet  6  . FLUoxetine (PROZAC) 40 MG capsule Take 40 mg by mouth daily.      . furosemide (LASIX) 80 MG tablet       . gabapentin (NEURONTIN) 300 MG capsule TAKE ONE CAPSULE BY MOUTH 3 TIMES A DAY  90 capsule  3  . glipiZIDE (GLUCOTROL) 10 MG tablet TAKE 1 TABLET BY MOUTH EVERY MORNING  30 tablet  3  . hydrALAZINE (APRESOLINE) 10 MG tablet Take 1 tablet (10 mg total) by mouth every 8 (eight) hours.  90 tablet  6  . insulin glargine (LANTUS) 100 UNIT/ML injection Inject 15 Units into the skin at bedtime.  10 mL  3  . LANTUS 100 UNIT/ML injection INJECT 15 UNITS SUBCUTANEOUSLY AT BEDTIME  10 mL  2  . omeprazole (PRILOSEC) 20 MG capsule Take 20 mg by mouth daily.      . rosuvastatin (CRESTOR) 10 MG tablet Take 10 mg by mouth daily.      . SUMAtriptan (IMITREX) 50 MG tablet Take one tablet at the first symptom of a migraine headache.  may repeat a second dose in 1hr if headache persists.  20 tablet  1  . terazosin (HYTRIN) 1 MG capsule Take 1 mg by mouth at bedtime.      . traMADol (ULTRAM) 50 MG tablet Take 1 tablet (50 mg total) by mouth 2 (two) times daily as needed for pain.  60 tablet  0  .  zolpidem (AMBIEN) 5 MG tablet Take 1 tablet (5 mg total) by mouth at bedtime as needed. For insomnia  30 tablet  1  . DISCONTD: furosemide (LASIX) 80 MG tablet TAKE 1 TABLET BY MOUTH TWICE A DAY  60 tablet  1  . DISCONTD: metoprolol tartrate (LOPRESSOR) 25 MG tablet Take 1 tablet (25 mg total) by mouth 2 (two) times daily.  60 tablet  1  . DISCONTD: traZODone (DESYREL) 25 mg TABS Take 0.5 tablets (25 mg total) by mouth at bedtime.  30 tablet  1     PHYSICAL EXAM: Filed Vitals:   06/09/11 1412  BP: 138/84  Pulse: 114  Temp: 98.4 F (36.9 C)  TempSrc: Oral  Weight: 233 lb 12 oz (106.028 kg)  SpO2: 100%    General: Ill appearing. Diaphoretic. Hacking cough. Labored breathing  HEENT: normal  Neck: supple. JVP 9-10. Carotids 2+ bilaterally; no bruits. No lymphadenopathy or thryomegaly appreciated.  Cor: PMI normal. Tachycardic with Regular rhythm. +S3  Lungs: decreased BS LLB with + crackles    Abdomen: soft, nontender, nondistend. No hepatosplenomegaly. No bruits or masses. Good bowel sounds.  Extremities: no cyanosis, clubbing, rash, trace edema  Neuro: alert & orientedx3, cranial nerves grossly intact. Moves all 4 extremities w/o difficulty. Affect pleasant.   ASSESSMENT & PLAN:

## 2011-06-09 NOTE — Progress Notes (Deleted)
Cardiology paged.  

## 2011-06-09 NOTE — Assessment & Plan Note (Signed)
This appears severe. Will have SW see.

## 2011-06-09 NOTE — Progress Notes (Signed)
Placed pt. On CPAP auto titrate (Min: 4, Max: 15) via nasal mask. Pt. Is tolerating CPAP well at this time. All vital signs are stable.

## 2011-06-09 NOTE — Progress Notes (Signed)
Pt complains of headache.  States he suffers from migraines and has had this headache for last four days.  States that Tylenol does not relieve his discomfort.  He takes Imitrex, Tramadol, and vicodin ("anything I can get my hands on").  [facility] paged.

## 2011-06-09 NOTE — Progress Notes (Signed)
ANTIBIOTIC CONSULT NOTE - INITIAL  Pharmacy Consult for Vancomycin/Zosyn Indication: Suspected PNA  Allergies  Allergen Reactions  . Shellfish Allergy Anaphylaxis  . Adhesive (Tape) Hives    Patient Measurements: Weight: 233 lb 14.5 oz (106.1 kg) Ht 72 inches  Vital Signs: Temp: 97.6 F (36.4 C) (05/22 1608) Temp src: Oral (05/22 1608) BP: 145/90 mmHg (05/22 1608) Pulse Rate: 102  (05/22 1608) Intake/Output from previous day:   Intake/Output from this shift:    Labs: No results found for this basename: WBC:3,HGB:3,PLT:3,LABCREA:3,CREATININE:3 in the last 72 hours The CrCl is unknown because both a height and weight (above a minimum accepted value) are required for this calculation. No results found for this basename: VANCOTROUGH:2,VANCOPEAK:2,VANCORANDOM:2,GENTTROUGH:2,GENTPEAK:2,GENTRANDOM:2,TOBRATROUGH:2,TOBRAPEAK:2,TOBRARND:2,AMIKACINPEAK:2,AMIKACINTROU:2,AMIKACIN:2, in the last 72 hours   Microbiology: No results found for this or any previous visit (from the past 720 hour(s)).  Medical History: Past Medical History  Diagnosis Date  . Hypertension   . 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 History of peptic ulcer disease     EGD 09/2007 shows prox gastric ulcer with black eschar, treated with PPI  . Depression   . History of alcohol abuse     quit approx 2011-2012  . Angina   . Shortness of breath   . OSA (obstructive sleep apnea)     sleeps with oxygen  . Cardiomyopathy   . ICD (implantable cardiac defibrillator) in place   . Diabetes mellitus     insulin dependent  . GERD (gastroesophageal reflux disease)     Medications:  Prescriptions prior to admission  Medication Sig Dispense Refill  . amitriptyline (ELAVIL) 25 MG tablet Take 1 tablet (25 mg total) by mouth at bedtime.  31 tablet  1  . benazepril (LOTENSIN) 40 MG tablet Take 40 mg by mouth daily.        . carvedilol (COREG) 3.125 MG tablet Take 2 tablets (6.25 mg total) by mouth 2 (two) times daily with a meal.  60 tablet  6  . CRESTOR 10 MG tablet TAKE 1 TABLET BY MOUTH EVERY DAY  30 tablet  3  . digoxin (LANOXIN) 0.25 MG tablet Take 1 tablet (0.25 mg total) by mouth daily.  30 tablet  6  . FLUoxetine (PROZAC) 40 MG capsule Take 40 mg by mouth daily.      . furosemide (LASIX) 80 MG tablet       . gabapentin (NEURONTIN) 300 MG capsule TAKE ONE CAPSULE BY MOUTH 3 TIMES A DAY  90 capsule  3  . glipiZIDE (GLUCOTROL) 10 MG tablet TAKE 1 TABLET BY MOUTH EVERY MORNING  30 tablet  3  . hydrALAZINE (APRESOLINE) 10 MG tablet Take 1 tablet (10 mg total) by mouth every 8 (eight) hours.  90 tablet  6  . insulin glargine (LANTUS) 100 UNIT/ML injection Inject 15 Units into the skin at bedtime.  10 mL  3  . LANTUS 100 UNIT/ML injection INJECT 15 UNITS SUBCUTANEOUSLY AT BEDTIME  10 mL  2  . omeprazole (PRILOSEC) 20 MG capsule Take 20 mg by mouth daily.      . rosuvastatin (CRESTOR) 10 MG tablet Take 10 mg by mouth daily.      . SUMAtriptan (IMITREX) 50 MG tablet Take one tablet at the first symptom of a migraine headache.  may repeat a second dose in 1hr if headache persists.  20 tablet  1  . terazosin (HYTRIN)  1 MG capsule Take 1 mg by mouth at bedtime.      . traMADol (ULTRAM) 50 MG tablet Take 1 tablet (50 mg total) by mouth 2 (two) times daily as needed for pain.  60 tablet  0  . zolpidem (AMBIEN) 5 MG tablet Take 1 tablet (5 mg total) by mouth at bedtime as needed. For insomnia  30 tablet  1   Assessment: 46 y.o. male presented to heart failure clinic today with acute on chronic systolic heart failure. Transferred to inpt stepdown. To begin broad spectrum antibiotics for possibility of LLL pneumonia. Noted first doses of antibiotics to begin after blood cultures drawn - spoke with RN and they are doing blood cultures right now.   Goal of Therapy:  Vancomycin trough level 15-20 mcg/ml  Plan:  1. Zosyn  3.375gm IV q8h. Each dose over 4 hours 2. Vancomycin 2 gm IV x 1 now then 1 gm IV q12h 3. Will f/u renal function and microbiological data  Christoper Fabian, PharmD, BCPS Clinical pharmacist, pager [phone] 06/09/2011,4:21 PM

## 2011-06-10 ENCOUNTER — Encounter (HOSPITAL_COMMUNITY): Payer: Self-pay | Admitting: *Deleted

## 2011-06-10 DIAGNOSIS — I5022 Chronic systolic (congestive) heart failure: Secondary | ICD-10-CM

## 2011-06-10 DIAGNOSIS — J189 Pneumonia, unspecified organism: Secondary | ICD-10-CM

## 2011-06-10 DIAGNOSIS — J9600 Acute respiratory failure, unspecified whether with hypoxia or hypercapnia: Secondary | ICD-10-CM

## 2011-06-10 LAB — BASIC METABOLIC PANEL
GFR calc Af Amer: 80 mL/min — ABNORMAL LOW (ref >90)
GFR calc non Af Amer: 69 mL/min — ABNORMAL LOW (ref >90)
Potassium: 3.3 mEq/L — ABNORMAL LOW (ref 3.5–5.1)
Sodium: 137 mEq/L (ref 135–145)

## 2011-06-10 LAB — GLUCOSE, CAPILLARY
Glucose-Capillary: 67 mg/dL — ABNORMAL LOW (ref 70–99)
Glucose-Capillary: 106 mg/dL — ABNORMAL HIGH (ref 70–99)
Glucose-Capillary: 64 mg/dL — ABNORMAL LOW (ref 70–99)
Glucose-Capillary: 90 mg/dL (ref 70–99)

## 2011-06-10 MED ORDER — SODIUM CHLORIDE 0.9 % IJ SOLN
10.0000 mL | INTRAMUSCULAR | Status: DC | PRN
  Administered 2011-06-11 – 2011-06-13 (×4): 10 mL

## 2011-06-10 MED ORDER — POTASSIUM CHLORIDE CRYS ER 20 MEQ PO TBCR
40.0000 meq | EXTENDED_RELEASE_TABLET | Freq: Once | ORAL | Status: AC
  Administered 2011-06-10: 40 meq via ORAL
  Filled 2011-06-10: qty 2

## 2011-06-10 MED ORDER — DIGOXIN 250 MCG PO TABS
0.2500 mg | ORAL_TABLET | Freq: Every day | ORAL | Status: DC
  Administered 2011-06-10 – 2011-06-13 (×4): 0.25 mg via ORAL
  Filled 2011-06-10 (×4): qty 1

## 2011-06-10 MED ORDER — DIPHENHYDRAMINE HCL 25 MG PO CAPS
50.0000 mg | ORAL_CAPSULE | Freq: Every evening | ORAL | Status: DC | PRN
  Administered 2011-06-10 – 2011-06-11 (×2): 50 mg via ORAL
  Filled 2011-06-10 (×3): qty 2

## 2011-06-10 MED ORDER — SODIUM CHLORIDE 0.9 % IJ SOLN
10.0000 mL | Freq: Two times a day (BID) | INTRAMUSCULAR | Status: DC
  Administered 2011-06-10 – 2011-06-13 (×2): 10 mL

## 2011-06-10 NOTE — Care Management Note (Signed)
    Page 1 of 1   06/10/2011     10:40:25 AM   CARE MANAGEMENT NOTE 06/10/2011  Patient:  [name], [name]   Account Number:  [id number]  Date Initiated:  06/10/2011  Documentation initiated by:  Junius Creamer  Subjective/Objective Assessment:   adm w heart failure     Action/Plan:   lives w wife and 3 children ages 02-25[birth year]08, pcp dr Jeanann Lewandowsky. was seen once by [facility] rn-not pleased w this program. if he needs hhc he prefers [facility] again for hhrn.   Anticipated DC Date:  06/12/2011   Anticipated DC Plan:  HOME W HOME HEALTH SERVICES  In-house referral  Clinical Social Worker      DC Planning Services  CM consult      Choice offered to / List presented to:             Status of service:   Medicare Important Message given?   (If response is "NO", the following Medicare IM given date fields will be blank) Date Medicare IM given:   Date Additional Medicare IM given:    Discharge Disposition:    Per UR Regulation:  Reviewed for med. necessity/level of care/duration of stay  If discussed at Long Length of Stay Meetings, dates discussed:    Comments:  5/23 10:38a debbie Martavia Tye rn,bsn pt prefers [facility] for hhc and would like [facility] again. he does not want to have [facility] again. asked sw to ck w pt about outpt resources for psych and transportation.

## 2011-06-10 NOTE — H&P (Signed)
Advanced Heart Failure Team History and Physical Note   PCP: Dr. Loistine Chance  Primary Cardiologist: Dr. Olga Millers  Primary Electrophysiologist: Dr. Lewayne Bunting    HPI:    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 Cath in 2008 showed left circumflex with 40% stenosis.   Last admission was in 03/2011 with NYHA Class IV symptoms, requiring milrinone therapy and IV lasix.   Cath 04/13/11: cardiogenic shock with biventricular failure (R>L) and possible restrictive physiology. RA = 22 with steep y-descents, RV = 45/12/25, PA = 51/25 (34), PCW = 18-20, Fick cardiac output/index = 3.1/1.4, Thermo CO/CI = 3.6/1.6, PVR = 4.8 Woods O2 sat = 85% (after sedation), PA sat = 31%, 36%. Milrinone was initiated for low output physiology. [name] was left in RIJ for hemodynamic monitoring and he was transferred to the CCU. Echo was done to re-evaluate RV function this showed LVEF 15%. RV moderately dilated with mildly reduced systolic function. RA and LA mildly dilated. PAPP 52 mmHG.LE dopplers ordered (negative for DVT). Digoxin, Ace-I and beta blocker started. Diuresed 33 pounds.  [name] stopped due to hyperkalemia.   He presented to the clinic today for follow up.  He says he feels terrible.  He is not sleeping well. He is sleeping in a recliner, occ PND. Increased fatigue. Increased dyspnea with exertion. Gets hot and clammy and ends up throwing up. Nonproductive cough. +fever/chills. Urine dark over the last 2 week, thinks there may be some blood in it. Not weighing at home because he is depressed.  He tried to follow up at an outpatient behavioral center but waited in line all day without seeing a provider.  He says he missed his last appointment because [facility] did not come and pick him up.    Review of Systems: [y] = yes, [ ]  = no   General: Weight gain [ ] ; Weight loss [ ] ; Anorexia [ ] ; Fatigue Cove.Etienne ]; Fever Cove.Etienne ]; Chills [ y]; Weakness Cove.Etienne ]  Cardiac:  Chest pain/pressure [ ] ; Resting SOB [ y]; Exertional SOB Cove.Etienne ]; Orthopnea [ y]; Pedal Edema [ ] ; Palpitations [ ] ; Syncope [ ] ; Presyncope [ ] ; Paroxysmal nocturnal dyspnea[y ]  Pulmonary: Cough Cove.Etienne ]; [facility] ]; Hemoptysis[ ] ; Sputum [ ] ; Snoring [ ]   GI: Vomiting[ ] ; Dysphagia[ ] ; Melena[ ] ; Hematochezia [ ] ; Heartburn[ ] ; Abdominal pain [ ] ; Constipation [ ] ; Diarrhea [ ] ; BRBPR [ ]   GU: Hematuria[ ] ; Dysuria [ ] ; Nocturia[ ]   Vascular: Pain in legs with walking [ ] ; Pain in feet with lying flat [ ] ; Non-healing sores [ ] ; Stroke [ ] ; TIA [ ] ; Slurred speech [ ] ;  Neuro: Headaches[ ] ; Vertigo[ ] ; Seizures[ ] ; Paresthesias[ ] ;Blurred vision [ ] ; Diplopia [ ] ; Vision changes [ ]   Ortho/Skin: Arthritis [ ] ; Joint pain [ ] ; Muscle pain [ ] ; Joint swelling [ ] ; Back Pain [ ] ; Rash [ ]   Psych: Depression[y ]; Anxiety[ ]   Heme: Bleeding problems [ ] ; Clotting disorders [ ] ; Anemia [ ]   Endocrine: Diabetes [ ] ; Thyroid dysfunction[ ]   Home Medications Prior to Admission medications   Medication Sig Start Date End Date Taking? Authorizing Provider  amitriptyline (ELAVIL) 25 MG tablet Take 1 tablet (25 mg total) by mouth at bedtime. 04/29/11 04/28/12  Zollie Beckers, MD  benazepril (LOTENSIN) 40 MG tablet Take 40 mg by mouth daily. 12/08/10   Almyra Deforest, MD  carvedilol (  COREG) 3.125 MG tablet Take 2 tablets (6.25 mg total) by mouth 2 (two) times daily with a meal. 05/14/11   Hadassah Pais, PA  CRESTOR 10 MG tablet TAKE 1 TABLET BY MOUTH EVERY DAY 05/19/11   Almyra Deforest, MD  digoxin (LANOXIN) 0.25 MG tablet Take 1 tablet (0.25 mg total) by mouth daily. 04/20/11 04/19/12  Hadassah Pais, PA  FLUoxetine (PROZAC) 40 MG capsule Take 40 mg by mouth daily. 12/08/10   Almyra Deforest, MD  furosemide (LASIX) 80 MG tablet  06/04/11   Almyra Deforest, MD  gabapentin (NEURONTIN) 300 MG capsule TAKE ONE CAPSULE BY MOUTH 3 TIMES A DAY 04/20/11   Almyra Deforest, MD  glipiZIDE (GLUCOTROL) 10 MG tablet TAKE 1 TABLET BY  MOUTH EVERY MORNING 05/19/11   Almyra Deforest, MD  hydrALAZINE (APRESOLINE) 10 MG tablet Take 1 tablet (10 mg total) by mouth every 8 (eight) hours. 04/20/11 04/19/12  Hadassah Pais, PA  insulin glargine (LANTUS) 100 UNIT/ML injection Inject 15 Units into the skin at bedtime. 04/29/11   Zollie Beckers, MD  LANTUS 100 UNIT/ML injection INJECT 15 UNITS SUBCUTANEOUSLY AT BEDTIME 05/22/11   Almyra Deforest, MD  omeprazole (PRILOSEC) 20 MG capsule Take 20 mg by mouth daily.    Historical Provider, MD  rosuvastatin (CRESTOR) 10 MG tablet Take 10 mg by mouth daily. 12/08/10 12/08/11  Almyra Deforest, MD  SUMAtriptan (IMITREX) 50 MG tablet Take one tablet at the first symptom of a migraine headache.  may repeat a second dose in 1hr if headache persists. 04/29/11   Zollie Beckers, MD  terazosin (HYTRIN) 1 MG capsule Take 1 mg by mouth at bedtime.    Historical Provider, MD  traMADol (ULTRAM) 50 MG tablet Take 1 tablet (50 mg total) by mouth 2 (two) times daily as needed for pain. 06/01/11   Almyra Deforest, MD  zolpidem (AMBIEN) 5 MG tablet Take 1 tablet (5 mg total) by mouth at bedtime as needed. For insomnia 04/29/11   Zollie Beckers, MD    Past Medical History: Past Medical History  Diagnosis Date  . Hypertension   . 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 History of peptic ulcer disease     EGD 09/2007 shows prox gastric ulcer with black eschar, treated with PPI  . Depression   . History of alcohol abuse     quit approx 2011-2012  . Angina   . Shortness of breath   . OSA (obstructive sleep apnea)     sleeps with oxygen  . Cardiomyopathy   . ICD (implantable cardiac defibrillator) in place   . Diabetes mellitus     insulin dependent  . GERD (gastroesophageal reflux disease)     Past Surgical History: Past Surgical History  Procedure Date  . Cardiac defibrillator placement 04/2007  . Knee surgery   .  Skin grafts   . Facial reconstruction surgery     Family History: Family History  Problem Relation Age of Onset  . Heart failure Mother   . Hypertension Mother   . Sarcoidosis Mother   . Diabetes type II Mother   . Heart attack Brother     Social History: History   Social History  . Marital Status: Married    Spouse Name: N/A    Number of Children: N/A  . Years of Education: N/A   Social History Main Topics  .  Smoking status: Never Smoker   . Smokeless tobacco: Never Used  . Alcohol Use: No     no alcohol since 2012  . Drug Use: No  . Sexually Active: Yes   Other Topics Concern  . None   Social History Narrative   Lives with his wife and her grandfather, on disability for CHF.    Allergies:  Allergies  Allergen Reactions  . Shellfish Allergy Anaphylaxis  . Adhesive (Tape) Hives    Objective:    Vital Signs:   Temp:  [97.6 F (36.4 C)-98.5 F (36.9 C)] 97.8 F (36.6 C) (05/23 0400) Pulse Rate:  [88-115] 88  (05/23 0400) Resp:  [20-32] 28  (05/23 0400) BP: (119-150)/(76-102) 137/82 mmHg (05/23 0400) SpO2:  [95 %-100 %] 98 % (05/23 0400) Weight:  [105.4 kg (232 lb 5.8 oz)-106.1 kg (233 lb 14.5 oz)] 105.4 kg (232 lb 5.8 oz) (05/23 0500) Last BM Date: 06/06/11 Filed Weights   06/09/11 1608 06/10/11 0500  Weight: 106.1 kg (233 lb 14.5 oz) 105.4 kg (232 lb 5.8 oz)    Physical Exam: General: Ill appearing. Diaphoretic. Hacking cough. Labored breathing  HEENT: normal  Neck: supple. JVP 9-10. Carotids 2+ bilaterally; no bruits. No lymphadenopathy or thryomegaly appreciated.  Cor: PMI normal. Tachycardic with Regular rhythm. +S3  Lungs: decreased BS LLB with + crackles  Abdomen: soft, nontender, nondistend. No hepatosplenomegaly. No bruits or masses. Good bowel sounds.  Extremities: no cyanosis, clubbing, rash, trace edema  Neuro: alert & orientedx3, cranial nerves grossly intact. Moves all 4 extremities w/o difficulty. Affect pleasant.   Assessment:    1) Acute respiratory failure possible PNA 2) Acute on chronic systolic heart failure with probable low-output shock 3) OSA 4) DM2  Plan/Discussion:    Mr. Cadieux presented to the clinic today with Class IV symptoms. I am concerned about a LLL pneumonia but he may also have a component  of low-output shock with only mild volume overload.  He will be admitted to stepdown, check labs, place PICC versus central line to measure co-ox and CVP. Check bcx/ua and cx, CXR. Start empiric milrinone and vanc/zosyn. Hold b-block and ACE-I for now.      Length of Stay: 1 Marquavious Nazar,MD 7:18 AM

## 2011-06-10 NOTE — Progress Notes (Signed)
CBG:64 @ 2125  Treatment: 15 GM carbohydrate snack  Symptoms: None  Follow-up CBG: Time:2155 CBG Result:90  Possible Reasons for Event: Inadequate meal intake  Comments: Levemir insulin held   Ruffin Pyo

## 2011-06-10 NOTE — Progress Notes (Signed)
Advanced Heart Failure Rounding Note   Subjective:    [name] is a 46 y/o male with HTN and DM2 with longstanding HF due to NICM (probably ETOH), EF 20%. Cath in 2008 showed left circumflex with 40% stenosis.  Admitted 5/22 from clinic.   Cath 04/13/11: cardiogenic shock with biventricular failure (R>L) and possible restrictive physiology. RA = 22 with steep y-descents, RV = 45/12/25, PA = 51/25 (34), PCW = 18-20, Fick cardiac output/index = 3.1/1.4, Thermo CO/CI = 3.6/1.6, PVR = 4.8 Woods O2 sat = 85% (after sedation), PA sat = 31%, 36%.  CXR 5/22: PNA involving right lung base.  Stable cardiomegaly without pulmonary edema.  On vanc and zosyn.  Blood cx pending.  Sleeping with CPAP.  HA last night.  Afebrile.    Pending PICC today. Objective:    Vital Signs:   Temp:  [97.6 F (36.4 C)-98.5 F (36.9 C)] 97.8 F (36.6 C) (05/23 0400) Pulse Rate:  [88-115] 88  (05/23 0400) Resp:  [20-32] 28  (05/23 0400) BP: (119-150)/(76-102) 137/82 mmHg (05/23 0400) SpO2:  [95 %-100 %] 98 % (05/23 0400) Weight:  [105.4 kg (232 lb 5.8 oz)-106.1 kg (233 lb 14.5 oz)] 105.4 kg (232 lb 5.8 oz) (05/23 0500) Last BM Date: 06/06/11  Weight change: Filed Weights   06/09/11 1608 06/10/11 0500  Weight: 106.1 kg (233 lb 14.5 oz) 105.4 kg (232 lb 5.8 oz)    Intake/Output:   Intake/Output Summary (Last 24 hours) at 06/10/11 0652 Last data filed at 06/10/11 1610  Gross per 24 hour  Intake 1502.5 ml  Output    300 ml  Net 1202.5 ml    Physical Exam:  General: Ill appearing. Hacking cough. Labored breathing  HEENT: normal  Neck: supple. JVP 9-10. Carotids 2+ bilaterally; no bruits. No lymphadenopathy or thryomegaly appreciated.  Cor: PMI normal. Tachycardic with Regular rhythm. +S3  Lungs: decreased BS LLB with + crackles  Abdomen: soft, nontender, nondistend. No hepatosplenomegaly. No bruits or masses. Good bowel sounds.  Extremities: no cyanosis, clubbing, rash, trace edema  Neuro: alert &  orientedx3, cranial nerves grossly intact. Moves all 4 extremities w/o difficulty. Affect pleasant.   Telemetry: SR 90s  Labs: Basic Metabolic Panel:  Lab 06/09/11 9604  NA 136  K 3.5  CL 102  CO2 22  GLUCOSE 151*  BUN 12  CREATININE 1.03  CALCIUM 9.4  MG 2.3  PHOS --    Liver Function Tests:  Lab 06/09/11 1642  AST 18  ALT 15  ALKPHOS 137*  BILITOT 0.9  PROT 8.2  ALBUMIN 3.4*   No results found for this basename: LIPASE:5,AMYLASE:5 in the last 168 hours No results found for this basename: AMMONIA:3 in the last 168 hours  CBC:  Lab 06/09/11 1642  WBC 7.8  NEUTROABS --  HGB 14.2  HCT 39.0  MCV 85.2  PLT 313    Cardiac Enzymes: No results found for this basename: CKTOTAL:5,CKMB:5,CKMBINDEX:5,TROPONINI:5 in the last 168 hours  BNP: BNP (last 3 results)  Basename 03/15/11 1242  PROBNP 4353.0*     Imaging: Dg Chest Port 1 View  06/09/2011  *RADIOLOGY REPORT*  Clinical Data: Shortness of breath.  Recent severe bronchitis.  PORTABLE CHEST - 1 VIEW 06/09/2011 1730 hours:  Comparison: Two-view chest x-ray 04/08/2011, 02/13/2011.  Findings: Cardiac silhouette moderately enlarged but stable.  Left subclavian pacing defibrillator unchanged.  New airspace opacities in the medial right lung base.  Lungs hyperinflated but otherwise clear.  Mild pulmonary venous hypertension without overt  edema. Prominent central pulmonary arteries, unchanged.  IMPRESSION: Pneumonia involving the right lung base.  Stable cardiomegaly without pulmonary edema.  Original Report Authenticated By: Arnell Sieving, M.D.      Medications:     Scheduled Medications:    . amitriptyline  25 mg Oral QHS  . FLUoxetine  40 mg Oral Daily  . gabapentin  300 mg Oral TID  . glipiZIDE  10 mg Oral Q breakfast  . heparin  5,000 Units Subcutaneous Q8H  . insulin aspart  0-15 Units Subcutaneous TID WC  . insulin aspart  0-5 Units Subcutaneous QHS  . insulin glargine  10 Units Subcutaneous QHS   . piperacillin-tazobactam (ZOSYN)  IV  3.375 g Intravenous Q8H  . sodium chloride  3 mL Intravenous Q12H  . vancomycin  2,000 mg Intravenous Once  . vancomycin  1,000 mg Intravenous Q12H  . DISCONTD: gabapentin  300 mg Oral TID  . DISCONTD: insulin aspart  0-15 Units Subcutaneous Q4H  . DISCONTD: insulin aspart  0-15 Units Subcutaneous TID WC  . DISCONTD: vancomycin  1,000 mg Intravenous Q12H     Infusions:    . 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 He remains quite dyspneic. I suspect he may also likely has component of low output HF but that is unclear. Will place PICC to day to check co-ox and CVP. Will continue vanc and zosyn for another 24-48 hours and then can consider switch to Avelox. Continue milrinone for now. Will hold off on adding lasix and other cardiac meds back just yet until PNA improving.    Length of Stay: 1

## 2011-06-10 NOTE — Clinical Social Work Psychosocial (Signed)
Clinical Social Work Department BRIEF PSYCHOSOCIAL ASSESSMENT 06/10/2011  Patient:  [name], [name]     Account Number:  [id number]     Admit date:  06/09/2011  Clinical Social Worker:  Hulan Fray  Date/Time:  06/10/2011 12:07 PM  Referred by:  Physician  Date Referred:  06/09/2011 Referred for  Other - See comment   Other Referral:   Depression Screen   Interview type:  Patient Other interview type:    PSYCHOSOCIAL DATA Living Status:  FAMILY Admitted from facility:   Level of care:   Primary support name:  Christina Primary support relationship to patient:  SPOUSE Degree of support available:   limited    CURRENT CONCERNS Current Concerns  Other - See comment   Other Concerns:   Financial, transportation, couseling resources    SOCIAL WORK ASSESSMENT / PLAN Clinical Social Worker received referral for depression screen for patient. CSW completed the Depression Scale (PHQ 9) patient scored a 15 which is considered major depression. CSW spoke with patient at length.  Patient lives with his wife and wife's grandfather, who is 71 years old and three children. The house that they reside in is the grandfather's, but per patient, the grandfather willing offered his house to assist patient and his family. Patient has 5 children total. He stated two are in college and the three that live with him are 44 years old, 44 years old and 44 years old. Patient stated that his children have medicaid, but his wife does not have insurance. Patient stated that his wife does not work and had battled a drug addiction that she has received help for. Patient stated that his wife does not do drugs anymore. Patient stated that he had an acohol problem and "been drinking all my life, up until I was 46 years old. It took about a month for me to stop completely."  Patient informed CSW that he cannot read or write. Patient discussed how he does not received help at home with the children.  Patient stated that his wife does not assist him with taking care of the children, like getting them dressed, feeding them or taking them to school. Patient kept repeating "I"m just so tired, I want to just die." Patient did not stated a specific plan for his suicidal thoughts.  Patient has used [facility] services to take him to appointments but they discontinue his transportation. Per patient, he had an appointment a Psychiatrist at [facility] and waited for a long time, about 2pm. Patient stated that the secretary came to speak to him saying that the Psychiatrist was going to leave early and was not sure if he would get to the patient. When patient called [facility] to pick him up and explained to the driver what occured, he was told that they were not going to transport him to his appointments because he did not see the Psychiatrist . Patient stated that he found out about [facility] not transporting him when he had an appointment for the doctor and nobody came to pick him up.  Patient stated that he went to [facility] for therapy about 2-3 weeks ago. He has gone to [facility] of the [place] and found out that the Psychiatrist there only comes once a month. Patient has filled out an application with [employer], but was told that he does not "qualify for services."  CSW will help patient obtain additional resources for counseling and transportation needs. CSW called financial counselor to see if they are able  to offer some assistance with the medicaid application. If patient qualifies for medicaid, he may be able to use their transportation services. Patient may also benefit a Psych consult.   Assessment/plan status:  Psychosocial Support/Ongoing Assessment of Needs Other assessment/ plan:   Information/referral to community resources:    PATIENTS/FAMILYS RESPONSE TO PLAN OF CARE: Patient was appreciative of CSW's assistance and agreeable for CSW to find additional resources for counseling and Medicaid assistance.

## 2011-06-10 NOTE — Progress Notes (Signed)
Cardiology paged.  

## 2011-06-11 LAB — GLUCOSE, CAPILLARY
Glucose-Capillary: 143 mg/dL — ABNORMAL HIGH (ref 70–99)
Glucose-Capillary: 117 mg/dL — ABNORMAL HIGH (ref 70–99)

## 2011-06-11 LAB — BASIC METABOLIC PANEL
Chloride: 104 mEq/L (ref 96–112)
Creatinine, Ser: 1.11 mg/dL (ref 0.50–1.35)
GFR calc Af Amer: >90 mL/min (ref >90)
GFR calc non Af Amer: 80 mL/min — ABNORMAL LOW (ref >90)
Potassium: 3.8 mEq/L (ref 3.5–5.1)

## 2011-06-11 LAB — CARBOXYHEMOGLOBIN: Methemoglobin: 1 % (ref 0.0–1.5)

## 2011-06-11 MED ORDER — ATORVASTATIN CALCIUM 20 MG PO TABS
20.0000 mg | ORAL_TABLET | Freq: Every day | ORAL | Status: DC
  Administered 2011-06-11 – 2011-06-12 (×2): 20 mg via ORAL
  Filled 2011-06-11 (×4): qty 1

## 2011-06-11 MED ORDER — BENAZEPRIL HCL 20 MG PO TABS
20.0000 mg | ORAL_TABLET | Freq: Every day | ORAL | Status: DC
  Administered 2011-06-11 – 2011-06-13 (×3): 20 mg via ORAL
  Filled 2011-06-11 (×3): qty 1

## 2011-06-11 MED ORDER — HYDROCODONE-ACETAMINOPHEN 5-325 MG PO TABS
1.0000 | ORAL_TABLET | Freq: Four times a day (QID) | ORAL | Status: DC | PRN
  Administered 2011-06-11 – 2011-06-12 (×5): 2 via ORAL
  Administered 2011-06-13: 1 via ORAL
  Administered 2011-06-13: 2 via ORAL
  Filled 2011-06-11 (×7): qty 2

## 2011-06-11 MED ORDER — ALBUTEROL SULFATE (5 MG/ML) 0.5% IN NEBU
2.5000 mg | INHALATION_SOLUTION | Freq: Four times a day (QID) | RESPIRATORY_TRACT | Status: DC
  Administered 2011-06-11: 2.5 mg via RESPIRATORY_TRACT
  Filled 2011-06-11: qty 0.5

## 2011-06-11 MED ORDER — MOXIFLOXACIN HCL 400 MG PO TABS
400.0000 mg | ORAL_TABLET | Freq: Every day | ORAL | Status: DC
  Filled 2011-06-11: qty 1

## 2011-06-11 MED ORDER — ALBUTEROL SULFATE (5 MG/ML) 0.5% IN NEBU: 2.5000 mg | INHALATION_SOLUTION | Freq: Four times a day (QID) | RESPIRATORY_TRACT | Status: DC | PRN

## 2011-06-11 NOTE — Progress Notes (Signed)
CSW and CM assisted patient with medicaid application. Patient and wife completed application and Cindy in financial counseling will send to DSS for processing.   [name] MSW [employer] [phone]

## 2011-06-11 NOTE — Progress Notes (Addendum)
Advanced Heart Failure Rounding Note   Subjective:    [name] is a 46 y/o male with HTN and DM2 with longstanding HF due to NICM (probably ETOH), EF 20%. Cath in 2008 showed left circumflex with 40% stenosis.  Admitted 5/22 from clinic.   Cath 04/13/11: cardiogenic shock with biventricular failure (R>L) and possible restrictive physiology. RA = 22 with steep y-descents, RV = 45/12/25, PA = 51/25 (34), PCW = 18-20, Fick cardiac output/index = 3.1/1.4, Thermo CO/CI = 3.6/1.6, PVR = 4.8 Woods O2 sat = 85% (after sedation), PA sat = 31%, 36%.  CXR 5/22: PNA involving right lung base.  Stable cardiomegaly without pulmonary edema.  On vanc and zosyn.  Blood cx pending.  PICC placed 5/24  Co-ox 62.4 CVP 7  HA this morning.  +cough but starting to feel better. No edema.  Still complaining about diet.   Objective:    Vital Signs:   Temp:  [97.7 F (36.5 C)-98.2 F (36.8 C)] 97.9 F (36.6 C) (05/24 0733) Pulse Rate:  [97-107] 97  (05/24 0950) Resp:  [17-27] 18  (05/24 0733) BP: (119-151)/(77-96) 140/89 mmHg (05/24 0733) SpO2:  [96 %-100 %] 100 % (05/24 0900) Weight:  [106.7 kg (235 lb 3.7 oz)] 106.7 kg (235 lb 3.7 oz) (05/24 0500) Last BM Date: 06/10/11  Weight change: Filed Weights   06/09/11 1608 06/10/11 0500 06/11/11 0500  Weight: 106.1 kg (233 lb 14.5 oz) 105.4 kg (232 lb 5.8 oz) 106.7 kg (235 lb 3.7 oz)    Intake/Output:   Intake/Output Summary (Last 24 hours) at 06/11/11 1028 Last data filed at 06/11/11 1000  Gross per 24 hour  Intake 1798.17 ml  Output   2026 ml  Net -227.83 ml    Physical Exam:  General: Illappearing. Hacking cough. Labored breathing  HEENT: normal  Neck: supple. JVP 7-8. Carotids 2+ bilaterally; no bruits. No lymphadenopathy or thryomegaly appreciated.  Cor: PMI normal. Tachycardic with Regular rhythm. +S3  Lungs: decreased BS LLB with + crackles  Abdomen: soft, nontender, nondistend. No hepatosplenomegaly. No bruits or masses. Good bowel  sounds.  Extremities: no cyanosis, clubbing, rash, trace edema  Neuro: alert & orientedx3, cranial nerves grossly intact. Moves all 4 extremities w/o difficulty. Affect pleasant.   Telemetry: SR 90s  Labs: Basic Metabolic Panel:  Lab 06/11/11 1610 06/10/11 0544 06/09/11 1642  NA 136 137 136  K 3.8 3.3* 3.5  CL 104 103 102  CO2 22 25 22   GLUCOSE 96 123* 151*  BUN 12 13 12   CREATININE 1.11 1.25 1.03  CALCIUM 8.4 8.5 9.4  MG -- -- 2.3  PHOS -- -- --    Liver Function Tests:  Lab 06/09/11 1642  AST 18  ALT 15  ALKPHOS 137*  BILITOT 0.9  PROT 8.2  ALBUMIN 3.4*   No results found for this basename: LIPASE:5,AMYLASE:5 in the last 168 hours No results found for this basename: AMMONIA:3 in the last 168 hours  CBC:  Lab 06/09/11 1642  WBC 7.8  NEUTROABS --  HGB 14.2  HCT 39.0  MCV 85.2  PLT 313    Cardiac Enzymes: No results found for this basename: CKTOTAL:5,CKMB:5,CKMBINDEX:5,TROPONINI:5 in the last 168 hours  BNP: BNP (last 3 results)  Basename 06/10/11 0544 03/15/11 1242  PROBNP 2537.0* 4353.0*     Imaging: Dg Chest Port 1 View  06/09/2011  *RADIOLOGY REPORT*  Clinical Data: Shortness of breath.  Recent severe bronchitis.  PORTABLE CHEST - 1 VIEW 06/09/2011 1730 hours:  Comparison: Two-view  chest x-ray 04/08/2011, 02/13/2011.  Findings: Cardiac silhouette moderately enlarged but stable.  Left subclavian pacing defibrillator unchanged.  New airspace opacities in the medial right lung base.  Lungs hyperinflated but otherwise clear.  Mild pulmonary venous hypertension without overt edema. Prominent central pulmonary arteries, unchanged.  IMPRESSION: Pneumonia involving the right lung base.  Stable cardiomegaly without pulmonary edema.  Original Report Authenticated By: Arnell Sieving, M.D.     Medications:     Scheduled Medications:    . albuterol  2.5 mg Nebulization QID  . amitriptyline  25 mg Oral QHS  . atorvastatin  20 mg Oral q1800  .  benazepril  20 mg Oral Daily  . digoxin  0.25 mg Oral Daily  . FLUoxetine  40 mg Oral Daily  . gabapentin  300 mg Oral TID  . glipiZIDE  10 mg Oral Q breakfast  . heparin  5,000 Units Subcutaneous Q8H  . insulin aspart  0-15 Units Subcutaneous TID WC  . insulin aspart  0-5 Units Subcutaneous QHS  . piperacillin-tazobactam (ZOSYN)  IV  3.375 g Intravenous Q8H  . sodium chloride  10-40 mL Intracatheter Q12H  . sodium chloride  3 mL Intravenous Q12H  . vancomycin  1,000 mg Intravenous Q12H  . DISCONTD: insulin glargine  10 Units Subcutaneous QHS    Infusions:    . milrinone 0.125 mcg/kg/min (06/11/11 0759)    PRN Medications: sodium chloride, acetaminophen, albuterol, diphenhydrAMINE, guaiFENesin-dextromethorphan, HYDROcodone-acetaminophen, HYDROcodone-acetaminophen, ondansetron (ZOFRAN) IV, sodium chloride, sodium chloride, traMADol   Assessment:   1) Community acquired PNA 2) Acute respiratory failure 3) Chronic systolic heart failure due to NICM 15-20%  4) OSA      -on CPAP 5) DM2  Plan/Discussion:    All labs and culture results reviewed personally. Co-ox and CVP look good so doubt HF is major factor this time and main issue is PNA. He is improving with Vanc/zosyn. Will continue for 24 hours and plan to switch to Avelox tomorrow.  Add nebs. Will stop milrinone and follow co-ox.   I have contacted Dr. Eben Burow with IM teaching service and they will assume care tomorrow. We will continue to follow along to help with HF issues. Restart benazepril today.  Appreciate IM care and CSW aid with follow up for depression and transportation needs.    Will transfer to tele.    Length of Stay: 2 Doratha Mcswain,MD 10:33 AM

## 2011-06-11 NOTE — Progress Notes (Signed)
Patient transferred from 2900 via stretcher. Patient able to get out of bed and ambulate to bed in room. Telemetry monitor applied on to patient. V/S stable and within range, oriented patient to room and call bell system when needing assistance. Patient complained of no pain during transfer. Will continue to monitor.

## 2011-06-11 NOTE — Progress Notes (Signed)
TRANSFERRED TO 4729 BY WHEELCHAIR, STABLE. REPORT GIVEN TO RN, BELONGINGS WITH PT.

## 2011-06-11 NOTE — Progress Notes (Signed)
Cosign for Rashida Haney RN assessment, med admin, I&O, and notes 

## 2011-06-11 NOTE — Progress Notes (Signed)
Pt. Placed himself on CPAP auto titrate (Min: 4, Max: 15) via nasal mask. Pt. Is tolerating CPAP well at this time. All vitals are within normal limits.

## 2011-06-11 NOTE — H&P (Signed)
Hospital Admission Note Date: 06/11/2011  Patient name: [name] Medical record number: [id number] Date of birth: [birth year] Age: 46 y.o. Gender: male PCP: Almyra Deforest, MD, MD  Medical Service: [facility] internal medicine teaching service  Attending physician: Dr. Blanch Media    1st Contact: Dr. Dierdre Searles    Pager: [phone] 2nd Contact: Dr. Allena Katz    Pager: [phone]  After 5 pm or weekends: 1st Contact:      Pager: [phone] 2nd Contact:      Pager: [phone]  Chief Complaint:   History of Present Illness: Mr. Gropp is a 46 y/o male with HTN, DM2, CHF/NICM (probably ETOH). He presented to HF clinic on 06/09/2011 for a follow-up after catheterization in 04/13/2011 (LVEF of 15%) complaining of feeling terrible and not sleeping well. He reported nonproductive cough with increased DOE.  +fever/chills.  Patient was admitted to step down on 06/09/2011, and CXR showed RLL PNA and stable cardiomegaly without pulmonary edma. He was placed on Vanc, Zosyn and empiric Milrinone .  PICC placed 06/11/1011 to check co-ox ( 62.4) and CVP ( 7).   On 06/11/2011, HF team determins that co-ox and CVP look good and HF is probably not a major factor this time and main issue is PNA. Milrinone is stopped. He is improving with Vanc/zosyn, will continue for 24 hours, and plan to switch to Avelox on 06/12/2011. Int. Med. Teaching Service is contacted today and will assume care on 06/12/2011. HF team will continue to follow along to help with HF issues.  Of note, Social work provided depression screen, counseling and medicaid resources on 06/10/2011.    Meds: Medications Prior to Admission  Medication Sig Dispense Refill  . amitriptyline (ELAVIL) 25 MG tablet Take 1 tablet (25 mg total) by mouth at bedtime.  31 tablet  1  . benazepril (LOTENSIN) 40 MG tablet Take 40 mg by mouth daily.      . carvedilol (COREG) 3.125 MG tablet Take 2 tablets (6.25 mg total) by mouth 2 (two) times daily with a meal.  60 tablet  6    . digoxin (LANOXIN) 0.25 MG tablet Take 1 tablet (0.25 mg total) by mouth daily.  30 tablet  6  . FLUoxetine (PROZAC) 40 MG capsule Take 40 mg by mouth daily.      . furosemide (LASIX) 80 MG tablet 40 mg daily.       Marland Kitchen gabapentin (NEURONTIN) 300 MG capsule TAKE ONE CAPSULE BY MOUTH 3 TIMES A DAY  90 capsule  3  . glipiZIDE (GLUCOTROL) 10 MG tablet TAKE 1 TABLET BY MOUTH EVERY MORNING  30 tablet  3  . hydrALAZINE (APRESOLINE) 10 MG tablet Take 1 tablet (10 mg total) by mouth every 8 (eight) hours.  90 tablet  6  . insulin glargine (LANTUS) 100 UNIT/ML injection Inject 15 Units into the skin at bedtime.  10 mL  3  . rosuvastatin (CRESTOR) 10 MG tablet Take 10 mg by mouth daily.      . rosuvastatin (CRESTOR) 10 MG tablet       . SUMAtriptan (IMITREX) 50 MG tablet Take one tablet at the first symptom of a migraine headache.  may repeat a second dose in 1hr if headache persists.  20 tablet  1  . terazosin (HYTRIN) 1 MG capsule Take 1 mg by mouth at bedtime.      . traMADol (ULTRAM) 50 MG tablet Take 1 tablet (50 mg total) by mouth 2 (two) times daily as needed for pain.  60  tablet  0  . zolpidem (AMBIEN) 5 MG tablet Take 1 tablet (5 mg total) by mouth at bedtime as needed. For insomnia  30 tablet  1  . DISCONTD: CRESTOR 10 MG tablet TAKE 1 TABLET BY MOUTH EVERY DAY  30 tablet  3    Allergies: Allergies as of 06/09/2011 - Review Complete 06/09/2011  Allergen Reaction Noted  . Shellfish allergy Anaphylaxis 07/03/2010  . Adhesive (tape) Hives 04/07/2011   Past Medical History  Diagnosis Date  . Hypertension   . 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 History of peptic ulcer disease     EGD 09/2007 shows prox gastric ulcer with black eschar, treated with PPI  . Depression   . History of alcohol abuse     quit approx 2011-2012  . Angina   . Shortness of breath   . OSA (obstructive sleep apnea)      sleeps with oxygen  . Cardiomyopathy   . ICD (implantable cardiac defibrillator) in place   . Diabetes mellitus     insulin dependent  . GERD (gastroesophageal reflux disease)    Past Surgical History  Procedure Date  . Cardiac defibrillator placement 04/2007  . Knee surgery   . Skin grafts   . Facial reconstruction surgery    Family History  Problem Relation Age of Onset  . Heart failure Mother   . Hypertension Mother   . Sarcoidosis Mother   . Diabetes type II Mother   . Heart attack Brother    History   Social History  . Marital Status: Married    Spouse Name: N/A    Number of Children: N/A  . Years of Education: N/A   Occupational History  . Not on file.   Social History Main Topics  . Smoking status: Never Smoker   . Smokeless tobacco: Never Used  . Alcohol Use: No     no alcohol since 2012  . Drug Use: No  . Sexually Active: Yes   Other Topics Concern  . Not on file   Social History Narrative   Lives with his wife and her grandfather, on disability for CHF.    Review of Systems: Review of Systems:  Constitutional:  Denies fever, chills, diaphoresis, appetite change and fatigue.   HEENT:  Denies congestion, sore throat, rhinorrhea, sneezing, mouth sores, trouble swallowing, neck pain   Respiratory:  Denies SOB, DOE and wheezing. Positive for cough  Cardiovascular:  Denies palpitations and leg swelling.   Gastrointestinal:  Denies nausea, vomiting, abdominal pain, diarrhea, constipation, blood in stool and abdominal distention.   Genitourinary:  Denies dysuria, urgency, frequency, hematuria, flank pain and difficulty urinating.   Musculoskeletal:  Denies myalgias, back pain, joint swelling, arthralgias and gait problem.   Skin:  Denies pallor, rash and wound.   Neurological:  Denies dizziness, seizures, syncope, weakness, light-headedness, numbness and headaches.    .    Physical Exam: Blood pressure 119/77, pulse 90, temperature 98.3 F (36.8  C), temperature source Oral, resp. rate 18, height 6' (1.829 m), weight 235 lb 3.7 oz (106.7 kg), SpO2 98.00%. General: alert, well-developed, and cooperative to examination.  Head: normocephalic and atraumatic.  Eyes: vision grossly intact, pupils equal, pupils round, pupils reactive to light, no injection and anicteric.  Mouth: pharynx pink and moist, no erythema, and no exudates.  Neck: supple, full ROM, no thyromegaly, no JVD, and no  carotid bruits.  Lungs: normal respiratory effort, no accessory muscle use, RLL rales noted. No wheezing or rhonchi.  Heart: normal rate, regular rhythm, no murmur, no gallop, and no rub.  Abdomen: soft, non-tender, normal bowel sounds, no distention, no guarding, no rebound tenderness, no hepatomegaly, and no splenomegaly.  Msk: no joint swelling, no joint warmth, and no redness over joints.  Pulses: 2+ DP/PT pulses bilaterally Extremities: No cyanosis, clubbing, edema Neurologic: alert & oriented X3, cranial nerves II-XII intact, strength normal in all extremities, sensation intact to light touch, and gait normal.  Skin: turgor normal and no rashes.  Psych: Oriented X3, memory intact for recent and remote, normally interactive, good eye contact, not anxious appearing, and not depressed appearing.    Lab results: Basic Metabolic Panel:  Basename 06/11/11 0530 06/10/11 0544 06/09/11 1642  Shantai Tiedeman 136 137 --  K 3.8 3.3* --  CL 104 103 --  CO2 22 25 --  GLUCOSE 96 123* --  BUN 12 13 --  CREATININE 1.11 1.25 --  CALCIUM 8.4 8.5 --  MG -- -- 2.3  PHOS -- -- --   Liver Function Tests:  Basename 06/09/11 1642  AST 18  ALT 15  ALKPHOS 137*  BILITOT 0.9  PROT 8.2  ALBUMIN 3.4*   CBC:  Basename 06/09/11 1642  WBC 7.8  NEUTROABS --  HGB 14.2  HCT 39.0  MCV 85.2  PLT 313   BNP:  Basename 06/10/11 0544  PROBNP 2537.0*   CBG:  Basename 06/11/11 1215 06/11/11 0732 06/10/11 2155 06/10/11 2124 06/10/11 1602 06/10/11 1219  GLUCAP 104* 143* 90  64* 106* 67*    Urine Drug Screen: Drugs of Abuse     Component Value Date/Time   LABOPIA NEG 07/09/2010 1522   LABOPIA NONE DETECTED 03/30/2010 1607   COCAINSCRNUR NEG 07/09/2010 1522   COCAINSCRNUR NONE DETECTED 03/30/2010 1607   LABBENZ NEG 07/09/2010 1522   LABBENZ NONE DETECTED 03/30/2010 1607   AMPHETMU NEG 07/09/2010 1522   AMPHETMU NONE DETECTED 03/30/2010 1607   THCU POSITIVE* 03/30/2010 1607   LABBARB  Value: NONE DETECTED        DRUG SCREEN FOR MEDICAL PURPOSES ONLY.  IF CONFIRMATION IS NEEDED FOR ANY PURPOSE, NOTIFY LAB WITHIN 5 DAYS.        LOWEST DETECTABLE LIMITS FOR URINE DRUG SCREEN Drug Class       Cutoff (ng/mL) Amphetamine      1000 Barbiturate      200 Benzodiazepine   200 Tricyclics       300 Opiates          300 Cocaine          300 THC              50 03/30/2010 1607     Urinalysis:  Basename 06/09/11 2250  COLORURINE AMBER*  LABSPEC 1.029  PHURINE 6.0  GLUCOSEU 100*  HGBUR TRACE*  BILIRUBINUR MODERATE*  KETONESUR 15*  PROTEINUR >300*  UROBILINOGEN 4.0*  NITRITE NEGATIVE  LEUKOCYTESUR TRACE*     Assessment & Plan by Problem: # disposition - likely discharge in 1-2 days - will touch base with HF team regarding his BB, diuretics and hydralazine - SW for issues related to his insurance and transportation. - may arrange HHSW if insurance allows. - no need for HHPT, however, may benefit from cardiac rehab if HF team agrees.   #. HCAP     The clinical manifestation is consistent with the HCAP ( hospitalized in the end  of March, 2013). He has improved on IV Vanc and Zosyn since admission.    - continue Neb, ABx and PRN cough meds. - will switch to Levaquin 750 mg po daily x 7 days starting from tomorrow to give patient a total of 10 days treatment.  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 BB, diuretics, hydralazine still on hold per HF team, will defer the treatment  to HF team.    # Depression    Major depression on Prozac. No SI/HI.  Had difficulty following up with his psychiatrist due to issues related to his insurance and transportation. SW is consulted during this admission.  - will continue the current treatment.  - likely follow up with Psych as an outpatient.   # OSA, on CPAP  # DM2, stable, on glucotrol and SSI, lantus on hold  # HTN, well controlled  # ZOX:WRUEAVW  Signed: Akyra Bouchie 06/11/2011, 7:44 PM

## 2011-06-12 ENCOUNTER — Encounter (HOSPITAL_COMMUNITY): Payer: Self-pay | Admitting: Internal Medicine

## 2011-06-12 DIAGNOSIS — I5023 Acute on chronic systolic (congestive) heart failure: Secondary | ICD-10-CM

## 2011-06-12 LAB — BASIC METABOLIC PANEL
BUN: 14 mg/dL (ref 6–23)
CO2: 25 mEq/L (ref 19–32)
Calcium: 9.1 mg/dL (ref 8.4–10.5)
Chloride: 106 mEq/L (ref 96–112)
Creatinine, Ser: 1.27 mg/dL (ref 0.50–1.35)
GFR calc Af Amer: 79 mL/min — ABNORMAL LOW (ref >90)
GFR calc non Af Amer: 68 mL/min — ABNORMAL LOW (ref >90)
Glucose, Bld: 108 mg/dL — ABNORMAL HIGH (ref 70–99)
Potassium: 4.7 mEq/L (ref 3.5–5.1)
Sodium: 139 mEq/L (ref 135–145)

## 2011-06-12 LAB — GLUCOSE, CAPILLARY
Glucose-Capillary: 146 mg/dL — ABNORMAL HIGH (ref 70–99)
Glucose-Capillary: 79 mg/dL (ref 70–99)
Glucose-Capillary: 104 mg/dL — ABNORMAL HIGH (ref 70–99)

## 2011-06-12 MED ORDER — SERTRALINE HCL 50 MG PO TABS
50.0000 mg | ORAL_TABLET | Freq: Every day | ORAL | Status: DC
  Administered 2011-06-12: 50 mg via ORAL
  Filled 2011-06-12 (×2): qty 1

## 2011-06-12 MED ORDER — LEVOFLOXACIN 750 MG PO TABS
750.0000 mg | ORAL_TABLET | Freq: Every day | ORAL | Status: DC
  Administered 2011-06-13: 750 mg via ORAL
  Filled 2011-06-12: qty 1

## 2011-06-12 MED ORDER — PIPERACILLIN-TAZOBACTAM 3.375 G IVPB
3.3750 g | Freq: Three times a day (TID) | INTRAVENOUS | Status: AC
  Administered 2011-06-12 (×2): 3.375 g via INTRAVENOUS
  Filled 2011-06-12 (×2): qty 50

## 2011-06-12 MED ORDER — VANCOMYCIN HCL IN DEXTROSE 1-5 GM/200ML-% IV SOLN
1000.0000 mg | Freq: Once | INTRAVENOUS | Status: AC
  Administered 2011-06-12: 1000 mg via INTRAVENOUS
  Filled 2011-06-12: qty 200

## 2011-06-12 MED ORDER — FUROSEMIDE 40 MG PO TABS
40.0000 mg | ORAL_TABLET | Freq: Every day | ORAL | Status: DC
  Administered 2011-06-12: 40 mg via ORAL
  Filled 2011-06-12 (×2): qty 1

## 2011-06-12 MED ORDER — CARVEDILOL 3.125 MG PO TABS
3.1250 mg | ORAL_TABLET | Freq: Two times a day (BID) | ORAL | Status: DC
  Administered 2011-06-12 – 2011-06-13 (×3): 3.125 mg via ORAL
  Filled 2011-06-12 (×5): qty 1

## 2011-06-12 MED ORDER — DIPHENHYDRAMINE HCL 50 MG/ML IJ SOLN
25.0000 mg | Freq: Once | INTRAMUSCULAR | Status: AC
  Administered 2011-06-12: 25 mg via INTRAVENOUS
  Filled 2011-06-12: qty 1

## 2011-06-12 MED ORDER — DIAZEPAM 5 MG PO TABS
5.0000 mg | ORAL_TABLET | Freq: Once | ORAL | Status: AC
  Administered 2011-06-12: 5 mg via ORAL
  Filled 2011-06-12: qty 1

## 2011-06-12 NOTE — Progress Notes (Signed)
Subjective: Patient states that he has less cough, no sputum. No chest pain or SOB.  Patient reports feeling depressed and anxious now. Denies SI/HI. He states that he has been on different medications in the past, and he doesnot think that Prozac works for him. He would like to have a different medicine for his depression and anxiety.  Patient also has concerns over his ED. See my assessment/plan.  Objective: Vital signs in last 24 hours: Filed Vitals:   06/12/11 0054 06/12/11 0624 06/12/11 1012 06/12/11 1017  BP:  137/90  142/64  Pulse: 97 98 92 92  Temp:  97.9 F (36.6 C)    TempSrc:  Oral    Resp: 18 18    Height:      Weight:  243 lb 9.6 oz (110.496 kg)    SpO2: 100% 98%     Weight change: 8 lb 5.9 oz (3.796 kg)  Intake/Output Summary (Last 24 hours) at 06/12/11 1052 Last data filed at 06/12/11 0907  Gross per 24 hour  Intake   1310 ml  Output   1950 ml  Net   -640 ml   General: anxious.  Neck: no JVD, and no carotid bruits.  Lungs:  RLL rales noted. No wheezing or rhonchi.  Heart: RRR. Abdomen: soft, BS x 4  Pulses: 2+ DP/PT pulses bilaterally Extremities: No cyanosis, clubbing, edema Neurologic: intact Skin: turgor normal and no rashes.    Lab Results: Basic Metabolic Panel:  Lab 06/12/11 1610 06/11/11 0530 06/09/11 1642  Jess Sulak 139 136 --  K 4.7 3.8 --  CL 106 104 --  CO2 25 22 --  GLUCOSE 108* 96 --  BUN 14 12 --  CREATININE 1.27 1.11 --  CALCIUM 9.1 8.4 --  MG -- -- 2.3  PHOS -- -- --   Liver Function Tests:  Lab 06/09/11 1642  AST 18  ALT 15  ALKPHOS 137*  BILITOT 0.9  PROT 8.2  ALBUMIN 3.4*   CBC:  Lab 06/09/11 1642  WBC 7.8  NEUTROABS --  HGB 14.2  HCT 39.0  MCV 85.2  PLT 313   BNP:  Lab 06/10/11 0544  PROBNP 2537.0*   CBG:  Lab 06/12/11 0633 06/11/11 2122 06/11/11 1615 06/11/11 1215 06/11/11 0732 06/10/11 2155  GLUCAP 146* 117* 78 104* 143* 90    Urine Drug Screen: Drugs of Abuse     Component Value Date/Time   LABOPIA NEG 07/09/2010 1522   LABOPIA NONE DETECTED 03/30/2010 1607   COCAINSCRNUR NEG 07/09/2010 1522   COCAINSCRNUR NONE DETECTED 03/30/2010 1607   LABBENZ NEG 07/09/2010 1522   LABBENZ NONE DETECTED 03/30/2010 1607   AMPHETMU NEG 07/09/2010 1522   AMPHETMU NONE DETECTED 03/30/2010 1607   THCU POSITIVE* 03/30/2010 1607   LABBARB  Value: NONE DETECTED        DRUG SCREEN FOR MEDICAL PURPOSES ONLY.  IF CONFIRMATION IS NEEDED FOR ANY PURPOSE, NOTIFY LAB WITHIN 5 DAYS.        LOWEST DETECTABLE LIMITS FOR URINE DRUG SCREEN Drug Class       Cutoff (ng/mL) Amphetamine      1000 Barbiturate      200 Benzodiazepine   200 Tricyclics       300 Opiates          300 Cocaine          300 THC              50 03/30/2010 1607    Urinalysis:  Lab 06/09/11 2250  COLORURINE AMBER*  LABSPEC 1.029  PHURINE 6.0  GLUCOSEU 100*  HGBUR TRACE*  BILIRUBINUR MODERATE*  KETONESUR 15*  PROTEINUR >300*  UROBILINOGEN 4.0*  NITRITE NEGATIVE  LEUKOCYTESUR TRACE*    Micro Results: Recent Results (from the past 240 hour(s))  MRSA PCR SCREENING     Status: Normal   Collection Time   06/09/11  4:26 PM      Component Value Range Status Comment   MRSA by PCR NEGATIVE  NEGATIVE  Final   CULTURE, BLOOD (ROUTINE X 2)     Status: Normal (Preliminary result)   Collection Time   06/09/11  4:41 PM      Component Value Range Status Comment   Specimen Description BLOOD LEFT ARM   Final    Special Requests BOTTLES DRAWN AEROBIC ONLY 10CC   Final    Culture  Setup Time [id number]   Final    Culture     Final    Value:        BLOOD CULTURE RECEIVED NO GROWTH TO DATE CULTURE WILL BE HELD FOR 5 DAYS BEFORE ISSUING A FINAL NEGATIVE REPORT   Report Status PENDING   Incomplete   CULTURE, BLOOD (ROUTINE X 2)     Status: Normal (Preliminary result)   Collection Time   06/09/11  4:44 PM      Component Value Range Status Comment   Specimen Description BLOOD LEFT HAND   Final    Special Requests     Final    Value: BOTTLES DRAWN AEROBIC  AND ANAEROBIC 8CC AER 7CC ANA   Culture  Setup Time [id number]   Final    Culture     Final    Value:        BLOOD CULTURE RECEIVED NO GROWTH TO DATE CULTURE WILL BE HELD FOR 5 DAYS BEFORE ISSUING A FINAL NEGATIVE REPORT   Report Status PENDING   Incomplete    Studies/Results: No results found. Medications: I have reviewed the patient's current medications. Scheduled Meds:   . amitriptyline  25 mg Oral QHS  . atorvastatin  20 mg Oral q1800  . benazepril  20 mg Oral Daily  . carvedilol  3.125 mg Oral BID AC  . diazepam  5 mg Oral Once  . digoxin  0.25 mg Oral Daily  . furosemide  40 mg Oral Daily  . gabapentin  300 mg Oral TID  . glipiZIDE  10 mg Oral Q breakfast  . heparin  5,000 Units Subcutaneous Q8H  . insulin aspart  0-15 Units Subcutaneous TID WC  . insulin aspart  0-5 Units Subcutaneous QHS  . levofloxacin  750 mg Oral Daily  . piperacillin-tazobactam (ZOSYN)  IV  3.375 g Intravenous Q8H  . piperacillin-tazobactam (ZOSYN)  IV  3.375 g Intravenous Q8H  . sertraline  50 mg Oral QHS  . sodium chloride  10-40 mL Intracatheter Q12H  . sodium chloride  3 mL Intravenous Q12H  . vancomycin  1,000 mg Intravenous Q12H  . vancomycin  1,000 mg Intravenous Once  . DISCONTD: albuterol  2.5 mg Nebulization QID  . DISCONTD: FLUoxetine  40 mg Oral Daily  . DISCONTD: moxifloxacin  400 mg Oral q1800   Continuous Infusions:  PRN Meds:.sodium chloride, acetaminophen, albuterol, diphenhydrAMINE, guaiFENesin-dextromethorphan, HYDROcodone-acetaminophen, ondansetron (ZOFRAN) IV, sodium chloride, sodium chloride, traMADol Assessment/Plan:  # disposition  - likely discharge in am -  HF team gradually titrate his medications, added BB and lasix today>> will see response in am , ? Adding Hydralazine. -  arrange [facility] RN/SW/CPAP - no need for HHPT, however, may benefit from cardiac rehab if HF team agrees.   #. HCAP  The clinical manifestation is consistent with the HCAP ( hospitalized in the end  of March, 2013). He has improved on IV Vanc and Zosyn since admission.   - continue Neb, ABx and PRN cough meds.  - will switch to Levaquin 750 mg po daily x 7 days starting from tomorrow to give patient a total of 10 days treatment.  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   # Depression  Major depression on Amtriptyline and Prozac. No SI/HI.  He was anxious when I met him this am. He reports that Prozac does not work for him.  Had difficulty following up with his psychiatrist due to issues related to his insurance and transportation. SW is consulted during this admission.   - SW re-consulted on assisting to find another Psych service.>>>SW will discuss with patient today. - will give him one dose Benzo for his anxiety now. Patient was on Xanax and Ativan in the past and stated that they did not work for him. - Valium 5 mg po x 1 dose - will D/C Prozac - will switch to another SSRI Zoloft ( I have thoroughly studied his medication history. He has been on Amtriptyline and Prozac for his depression. He was also put on short term Xanax, Ativan for his anxiety in the past. He was given Ambien for insomnia as well.) - likely follow up with Psych as an outpatient.    # ED    Patient has a history of ED which was treated with Vardenafil and Viagra In August 2012. He states that he has current ED problem. The etiology is likely related to his DM, CHF and BB.  - will consult CM for assistance on Vacuum device - caution initiation of any medicine due to his compromised cardiac function.  # OSA, on CPAP  - sleep study done on 05/26/11 - CM consulted for home CPAP machine   # DM2, stable, on glucotrol and SSI, lantus on hold  - patient states that he takes Glucotrol daily am and would only take Lantus insulin 15 units at bedtime if his CBG > 125. - Last HGB A1C 6.9 in March 2013, will resume his  regimen upon discharge.  # HTN, well controlled   # ZOX:WRUEAVW   LOS: 3 days   Nyonna Hargrove 06/12/2011, 10:52 AM

## 2011-06-12 NOTE — Progress Notes (Signed)
Subjective: No SOB  Very tired  Taking nap.   Objective: Filed Vitals:   06/11/11 1425 06/11/11 2123 06/12/11 0054 06/12/11 0624  BP: 119/77 139/88  137/90  Pulse: 90 101 97 98  Temp: 98.3 F (36.8 C) 98.1 F (36.7 C)  97.9 F (36.6 C)  TempSrc: Oral Oral  Oral  Resp: 18 18 18 18   Height: 6' (1.829 m)     Weight:    243 lb 9.6 oz (110.496 kg)  SpO2: 98% 100% 100% 98%   Weight change: 8 lb 5.9 oz (3.796 kg)  Intake/Output Summary (Last 24 hours) at 06/12/11 0747 Last data filed at 06/12/11 0655  Gross per 24 hour  Intake   1226 ml  Output   1950 ml  Net   -724 ml    General: Alert, awake, oriented x3, in no acute distress Neck:  JVP is normal Heart: Regular rate and rhythm, without murmurs, rubs, gallops.  Lungs: Clear to auscultation.  No rales or wheezes. Exemities:  No edema.   Neuro: Grossly intact, nonfocal.  Tele:  SR.  Lab Results: Results for orders placed during the hospital encounter of 06/09/11 (from the past 24 hour(s))  CARBOXYHEMOGLOBIN     Status: Abnormal   Collection Time   06/11/11  8:20 AM      Component Value Range   Total hemoglobin 12.7 (*) 13.5 - 18.0 (g/dL)   O2 Saturation 46.9     Carboxyhemoglobin 1.4  0.5 - 1.5 (%)   Methemoglobin 1.0  0.0 - 1.5 (%)  GLUCOSE, CAPILLARY     Status: Abnormal   Collection Time   06/11/11 12:15 PM      Component Value Range   Glucose-Capillary 104 (*) 70 - 99 (mg/dL)  GLUCOSE, CAPILLARY     Status: Normal   Collection Time   06/11/11  4:15 PM      Component Value Range   Glucose-Capillary 78  70 - 99 (mg/dL)   Comment 1 Documented in Chart     Comment 2 Notify RN    GLUCOSE, CAPILLARY     Status: Abnormal   Collection Time   06/11/11  9:22 PM      Component Value Range   Glucose-Capillary 117 (*) 70 - 99 (mg/dL)   Comment 1 Notify RN     Comment 2 Documented in Chart    BASIC METABOLIC PANEL     Status: Abnormal   Collection Time   06/12/11  6:04 AM      Component Value Range   Sodium 139  135 -  145 (mEq/L)   Potassium 4.7  3.5 - 5.1 (mEq/L)   Chloride 106  96 - 112 (mEq/L)   CO2 25  19 - 32 (mEq/L)   Glucose, Bld 108 (*) 70 - 99 (mg/dL)   BUN 14  6 - 23 (mg/dL)   Creatinine, Ser 6.29  0.50 - 1.35 (mg/dL)   Calcium 9.1  8.4 - 52.8 (mg/dL)   GFR calc non Af Amer 68 (*) >90 (mL/min)   GFR calc Af Amer 79 (*) >90 (mL/min)  GLUCOSE, CAPILLARY     Status: Abnormal   Collection Time   06/12/11  6:33 AM      Component Value Range   Glucose-Capillary 146 (*) 70 - 99 (mg/dL)    Studies/Results: No results found.  Medications: I have reviewed the patient's current medications.   Patient Active Hospital Problem List: PNA (pneumonia) (06/10/2011)   Assessment: Receiving ABX  Possible switch  to PO today.     CHF:  NICM  LVEF 15 to 20%  VOlume status is not bad.  Benazepril restarted yesterday.  I would recomm restarting COreg.    HTN:  FOllow with meds. Dietrich Pates 06/12/2011, 7:48 AM

## 2011-06-12 NOTE — H&P (Signed)
Internal Medicine Teaching Service Attending Note Date: 06/12/2011  Patient name: [name]  Medical record number: [id number]  Date of birth: [birth year]   I have seen and evaluated Halford Decamp and discussed their care with the Residency Team. Please see Dr Stevenson Clinch H&P for full details. I agree with the formulated Assessment and Plan with the following changes:   Mr Dohrmann is a pt of Dr Gwenlyn Fudge. He was admitted from the HF clinic for resp distress, determine to be 2/2 HAP and was appropriately treated. HF meds were held on admit and are now slowly being added back. His youngest son turns 7 tomorrow and his goal is to be home by then, which I think we can accomplish.  1. HAP - Dr Dierdre Searles will change ABX tomorrow after his third day of Vanc and zosyn to PO levaquin. He will complete a total of 10 days of ABX. He still has a cough that is not responsive to the PO Robitussin. I would be hesitant to change to an opioid cough suppressant. He is afebrile with no leukocytosis. He is oxygenating well. Lungs show coarse BS in bases B. Stable for D/C home tomorrow from HAP standpoint.   2. Non ischemic CM - ACEI was restarted yesterday and BB and lasix being started today. He is euvolemic. Heart exam is nl. Dig level was adequate on admit. He is already plugged into HF clinic. Hopefully, his HF regimen will be adequate for D/C tomorrow.   3. DM 2 - has not been taking lantus at home and A1C good. Agree with D/C lantus and use glucotrol alone.  4. ED - Pt is not able to get an erection and unable to have sex with wife > 6 months. This has been noted in [facility] notes and pt is frustrated bc medicare would not cover or the co-pay was too high to get Viagra / Cialis. He is afraid his wife is or may bc unfaithful. Per records, wife left briefly summer of 2012. The etiology of his ED is likely multifactorial - inc meds (anti-HTN, opioids - had been taking other's opioids, now off SSRI), chronic medical issues, obesity,  depression. Testosterone is not an option as its cardiac side effects never fully elucidated. Viagra and cialis apparently cost prohibitive. Discussed vacuum assist device and pt willing. Dr Dierdre Searles will discuss how to get this process started.  5. Depression / anxiety / anger issues / illiteracy - pt is "tired". He is too tired of going thorough all of this and no physicians listen to him and help him. He would be better off dead but has no plan. No guns in home. Has gun but is at cousins house bc he didn't want that option. He is able to state that his children love him and tell him they want him around be then states they would be off better without bc they would be provider for. He was referred to mental health as far back as summer 2012. He states that he went to [facility] and sat in waiting room from 9-2 and then told provider leaving early and he wouldn't be seen. He stopped Prozac one month ago bc it wasn't working. Frustrated bc doctors didn't listen to him and change to something else. Gets anxiety attacks. Wife is encouraging him to take Valium which he doesn't want to. Stays in room all day watching TV alone. Can't go outside bc too hot and he sweats. Frustrated with our clinic bc he sees different providers.  Saw PCP 4 times, no showed with her only twice, and most two recent appts were with non-PCP. Liked Dr Reche Dixon at [facility] but he left and is no longer seeing pts.   A. He is not actively suicidal - no plans. Therefore, urgent inpt pysch not indicated and pt stated he would not sign himself into inpt physch.   B. He needs long term outpt treatment. Since he did not have a good experience with [name], will see which other providers will take medicare.  C. D/C prozac and start new SSRI / SNRI  D. Since pt is frustrated with our clinic and sees Dr Milas Kocher, will see if we can transition care to [facility] who might be able to provide a mit more continuity than a residency clinic.   E.  His mental illness is really impacting his medical conditions and I hope we can get a better treatment option for his mental health issues.    6. Social issues - [facility] recently dismissed pt bc he was "non-compliant." Pt states they never told him they were dismissing him and there fore he was unable to go to two subsequent appts. I had subsequently requested that they reexamine his case as we are asking for a lifestyle change and that comes slowly and he was at high readmission risk. Never heard back.

## 2011-06-12 NOTE — Progress Notes (Addendum)
ANTIBIOTIC CONSULT NOTE - FOLLOW UP  Pharmacy Consult for Zosyn Indication: HCAP  Assessment: 46 yo male with acute on chronic heart failure, who is being switched from vanco/zosyn to PO Levaquin for RLL pneumonia (treated as HCAP).  Today is Day #3/8 for pneumonia treament.  Clinically improving, afebrile, SCr stable, BCx ngtd x2 (5/22)  Plan:  -Continue vancomycin & zosyn through today -Switch to Levaquin 750mg  PO tomorrow AM  Bayard Beaver. Saul Fordyce, PharmD Pager: [phone] 06/12/2011,9:23 AM  Allergies  Allergen Reactions  . Shellfish Allergy Anaphylaxis  . Adhesive (Tape) Hives    Patient Measurements: Height: 6' (182.9 cm) Weight: 243 lb 9.6 oz (110.496 kg) (scale B) IBW/kg (Calculated) : 77.6   Vital Signs: Temp: 97.9 F (36.6 C) (05/25 0624) Temp src: Oral (05/25 0624) BP: 137/90 mmHg (05/25 0624) Pulse Rate: 98  (05/25 0624) Intake/Output from previous day: 05/24 0701 - 05/25 0700 In: 1226 [P.O.:540; I.V.:136; IV Piggyback:550] Out: 1950 [Urine:1950] Intake/Output from this shift: Total I/O In: 120 [P.O.:120] Out: -   Labs:  Basename 06/12/11 0604 06/11/11 0530 06/10/11 0544 06/09/11 1642  WBC -- -- -- 7.8  HGB -- -- -- 14.2  PLT -- -- -- 313  LABCREA -- -- -- --  CREATININE 1.27 1.11 1.25 --   Estimated Creatinine Clearance: 96.3 ml/min (by C-G formula based on Cr of 1.27). No results found for this basename: VANCOTROUGH:2,VANCOPEAK:2,VANCORANDOM:2,GENTTROUGH:2,GENTPEAK:2,GENTRANDOM:2,TOBRATROUGH:2,TOBRAPEAK:2,TOBRARND:2,AMIKACINPEAK:2,AMIKACINTROU:2,AMIKACIN:2, in the last 72 hours   Microbiology: Recent Results (from the past 720 hour(s))  MRSA PCR SCREENING     Status: Normal   Collection Time   06/09/11  4:26 PM      Component Value Range Status Comment   MRSA by PCR NEGATIVE  NEGATIVE  Final   CULTURE, BLOOD (ROUTINE X 2)     Status: Normal (Preliminary result)   Collection Time   06/09/11  4:41 PM      Component Value Range Status Comment   Specimen Description BLOOD LEFT ARM   Final    Special Requests BOTTLES DRAWN AEROBIC ONLY 10CC   Final    Culture  Setup Time [id number]   Final    Culture     Final    Value:        BLOOD CULTURE RECEIVED NO GROWTH TO DATE CULTURE WILL BE HELD FOR 5 DAYS BEFORE ISSUING A FINAL NEGATIVE REPORT   Report Status PENDING   Incomplete   CULTURE, BLOOD (ROUTINE X 2)     Status: Normal (Preliminary result)   Collection Time   06/09/11  4:44 PM      Component Value Range Status Comment   Specimen Description BLOOD LEFT HAND   Final    Special Requests     Final    Value: BOTTLES DRAWN AEROBIC AND ANAEROBIC 8CC AER 7CC ANA   Culture  Setup Time [id number]   Final    Culture     Final    Value:        BLOOD CULTURE RECEIVED NO GROWTH TO DATE CULTURE WILL BE HELD FOR 5 DAYS BEFORE ISSUING A FINAL NEGATIVE REPORT   Report Status PENDING   Incomplete     Anti-infectives     Start     Dose/Rate Route Frequency Ordered Stop   06/12/11 1800   moxifloxacin (AVELOX) tablet 400 mg        400 mg Oral Daily-1800 06/11/11 1033     06/10/11 0700   vancomycin (VANCOCIN) IVPB 1000 mg/200 mL premix  1,000 mg 200 mL/hr over 60 Minutes Intravenous Every 12 hours 06/09/11 1634 06/12/11 0709   06/09/11 1700   vancomycin (VANCOCIN) 2,000 mg in sodium chloride 0.9 % 500 mL IVPB        2,000 mg 250 mL/hr over 120 Minutes Intravenous  Once 06/09/11 1632 06/09/11 1909   06/09/11 1700  piperacillin-tazobactam (ZOSYN) IVPB 3.375 g       3.375 g 12.5 mL/hr over 240 Minutes Intravenous 3 times per day 06/09/11 1632 06/12/11 1359   06/09/11 0500   vancomycin (VANCOCIN) IVPB 1000 mg/200 mL premix  Status:  Discontinued        1,000 mg 200 mL/hr over 60 Minutes Intravenous Every 12 hours 06/09/11 1632 06/09/11 1633

## 2011-06-13 LAB — BASIC METABOLIC PANEL
BUN: 16 mg/dL (ref 6–23)
CO2: 25 mEq/L (ref 19–32)
Glucose, Bld: 184 mg/dL — ABNORMAL HIGH (ref 70–99)
Potassium: 4.3 mEq/L (ref 3.5–5.1)
Sodium: 132 mEq/L — ABNORMAL LOW (ref 135–145)

## 2011-06-13 MED ORDER — GLIPIZIDE 10 MG PO TABS: 10.0000 mg | ORAL_TABLET | Freq: Every day | ORAL | Status: DC

## 2011-06-13 MED ORDER — INSULIN GLARGINE 100 UNIT/ML ~~LOC~~ SOLN: 15.0000 [IU] | Freq: Every day | SUBCUTANEOUS | Status: DC

## 2011-06-13 MED ORDER — FUROSEMIDE 10 MG/ML IJ SOLN
40.0000 mg | Freq: Once | INTRAMUSCULAR | Status: AC
  Administered 2011-06-13: 40 mg via INTRAVENOUS
  Filled 2011-06-13: qty 4

## 2011-06-13 MED ORDER — ROSUVASTATIN CALCIUM 10 MG PO TABS: 10.0000 mg | ORAL_TABLET | Freq: Every day | ORAL | Status: DC

## 2011-06-13 MED ORDER — SERTRALINE HCL 50 MG PO TABS: 50.0000 mg | ORAL_TABLET | Freq: Every day | ORAL | Status: DC

## 2011-06-13 MED ORDER — GUAIFENESIN-DM 100-10 MG/5ML PO SYRP: 5.0000 mL | ORAL_SOLUTION | ORAL | Status: AC | PRN

## 2011-06-13 MED ORDER — HYDROCODONE-ACETAMINOPHEN 5-325 MG PO TABS: 1.0000 | ORAL_TABLET | Freq: Four times a day (QID) | ORAL | Status: AC | PRN

## 2011-06-13 MED ORDER — CARVEDILOL 3.125 MG PO TABS: 6.2500 mg | ORAL_TABLET | Freq: Two times a day (BID) | ORAL | Status: DC

## 2011-06-13 MED ORDER — BENAZEPRIL HCL 40 MG PO TABS: 20.0000 mg | ORAL_TABLET | Freq: Every day | ORAL | Status: DC

## 2011-06-13 MED ORDER — TADALAFIL 5 MG PO TABS: 5.0000 mg | ORAL_TABLET | Freq: Every day | ORAL | Status: DC | PRN

## 2011-06-13 MED ORDER — LEVOFLOXACIN 750 MG PO TABS: 750.0000 mg | ORAL_TABLET | Freq: Every day | ORAL | Status: AC

## 2011-06-13 MED ORDER — GABAPENTIN 300 MG PO CAPS: 900.0000 mg | ORAL_CAPSULE | Freq: Three times a day (TID) | ORAL | Status: DC

## 2011-06-13 MED ORDER — CARVEDILOL 6.25 MG PO TABS
6.2500 mg | ORAL_TABLET | Freq: Two times a day (BID) | ORAL | Status: DC
  Filled 2011-06-13 (×2): qty 1

## 2011-06-13 MED ORDER — AMITRIPTYLINE HCL 25 MG PO TABS: 25.0000 mg | ORAL_TABLET | Freq: Every day | ORAL | Status: DC

## 2011-06-13 MED ORDER — TRAMADOL HCL 50 MG PO TABS: 50.0000 mg | ORAL_TABLET | Freq: Two times a day (BID) | ORAL | Status: DC | PRN

## 2011-06-13 MED ORDER — TERAZOSIN HCL 1 MG PO CAPS: 1.0000 mg | ORAL_CAPSULE | Freq: Every day | ORAL | Status: DC

## 2011-06-13 NOTE — Progress Notes (Signed)
Cm spoke with pt concerning dc planning. MD order for HHRN/PT. Per pt choice [facility] to provide [facility] services. Md order for CPAP machine. Pt had sleep study 05/26/11. [facility] able to deliver DME to residence for home set-up with rx from MD with specific numerical settings for CPAP. No other needs stated.   Leonie Green [phone]

## 2011-06-13 NOTE — Progress Notes (Signed)
Patient verbalized understanding of discharge instructions.  Home health needs taken care of prior to discharge.  PIC Line removed by IV Therapy and client remained in bed per policy for 20 minutes, dressing remained dry and intact.  Good radial pulse.  Rx for pain med given per order.

## 2011-06-13 NOTE — Discharge Instructions (Signed)
[facility] [phone] hhrn Please take all your medications as prescribed, daily. Including Lasix and Levofloxacin ( Antibiotics for next 6 days ). Heart Failure clinic will call you with Appointment and our clinic will call you for appointment with Dr. Rogelia Boga in June. If any other problems or concerns meanwhile, call [phone].

## 2011-06-13 NOTE — Progress Notes (Signed)
Subjective: No SOB  Just tired.  No CP. Objective: Filed Vitals:   06/12/11 2133 06/13/11 0020 06/13/11 0707 06/13/11 0711  BP: 141/114  132/91 132/91  Pulse: 106 113 98 98  Temp: 98.1 F (36.7 C)  97.3 F (36.3 C)   TempSrc: Oral  Oral   Resp: 18 18 18    Height:      Weight:   237 lb 7 oz (107.7 kg)   SpO2: 100% 99% 98%    Weight change:   Intake/Output Summary (Last 24 hours) at 06/13/11 0847 Last data filed at 06/12/11 1814  Gross per 24 hour  Intake   1330 ml  Output   1175 ml  Net    155 ml    General: Alert, awake, oriented x3, in no acute distress Neck:  JVP is approx 10 Heart: Regular rate and rhythm, without murmurs, rubs, gallops.  Lungs: Clear to auscultation.  No rales or wheezes. Exemities:  No edema.   Neuro: Grossly intact, nonfocal.  Tele:  SR. Lab Results: Results for orders placed during the hospital encounter of 06/09/11 (from the past 24 hour(s))  GLUCOSE, CAPILLARY     Status: Normal   Collection Time   06/12/11 11:02 AM      Component Value Range   Glucose-Capillary 79  70 - 99 (mg/dL)  GLUCOSE, CAPILLARY     Status: Abnormal   Collection Time   06/12/11  4:10 PM      Component Value Range   Glucose-Capillary 104 (*) 70 - 99 (mg/dL)  GLUCOSE, CAPILLARY     Status: Abnormal   Collection Time   06/12/11  9:20 PM      Component Value Range   Glucose-Capillary 134 (*) 70 - 99 (mg/dL)   Comment 1 Notify RN    GLUCOSE, CAPILLARY     Status: Normal   Collection Time   06/13/11  7:10 AM      Component Value Range   Glucose-Capillary 94  70 - 99 (mg/dL)   Comment 1 Notify RN      Studies/Results: No results found.  Medications: I have reviewed the patient's current medications.   Patient Active Hospital Problem List: PNA (pneumonia) (06/10/2011)   Assessment: COntinues on ABX  NICM  Probably due to EtOH  LVEF 20%  Cath in 2008 with 40% stensosis  Patient is 1 L positive to day.    I would give 1 dose IV this AM then D/C.   Increase  coreg to 6.25 bid  Follow BP Check labs today.  Depression:  Per primary team  Will make sure he has f/u in CHF clinic.    LOS: 4 days   Dietrich Pates 06/13/2011, 8:47 AM

## 2011-06-13 NOTE — Progress Notes (Signed)
Internal Medicine Teaching Service Attending Note Date: 06/13/2011  Patient name: QUINTEL MCCALLA  Medical record number: [id number]  Date of birth: Dec 20, 1965    This patient has been seen and discussed with the house staff. Please see their note for complete details. I concur with their findings with the following additions/corrections:  Dr Allena Katz is planning on D/C later today after an additional IV dose Lasix. Mr Hansson seems to be in better spirits today. It is his son's 44th BDay today and he had just gotten off phone with him. I reviewed that his depression med was changed and that it will take 6 weeks to notice full effect. If not better, can increase dose or try another. Pt also had sheet of local pysch and therapists and states that he and his wife will start calling once the offices open after the Monday holiday.   We also spoke about his ED. Viagra too $$. Didn't realize that Dr Loistine Chance had sent Levitra and never got it. I still need to work on the pump device. Will contact Dr Loistine Chance to see if we can get this process started.  Finally we spoke about PCP's. Pt now not ready to give up on [facility] but wants to see Dr Loistine Chance for non-acute visits. The issue is that she has no open June appts. I have no open appts in June but would be willing to see him on a free day. Will also see if Dr Allena Katz has open appt slots and would be willing to see him in F/U. I have communicated with Tyler Aas to get him sch with [facility] July, Aug, and Sep. With a bit of extra work, we might be successful with continuity for Mr Kirchgessner. Will also see where [facility] is with their decision.   Jahshua Bonito 06/13/2011, 12:19 PM

## 2011-06-13 NOTE — Discharge Summary (Signed)
DISCHARGE SUMMARY  Patient Name:  [name]  MRN: [id number]  PCP: Almyra Deforest, MD, MD  DOB:  April 02, 1965     CSN NUMBER :   Date of Admission:  06/09/2011  Date of Discharge:  06/13/2011      Attending Physician: Dr. Blanch Media          DISCHARGE DIAGNOSES: 1.  HAP (  healthcare associated pneumonia ) 2.  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  Depression/anxiety  4.  Diabetes mellitus type 2 5.  Erectile dysfunction  6.  Obstructive sleep apnea - on CPAP  7.  Hypertension 8.  Hyperlipidemia 9.  Migraine   DISPOSITION AND FOLLOW-UP: [name] is to follow-up with the listed providers as detailed below, at which time, please recheck BMP and check weight change. Also evaluate for symptomatic improvement in depression with Zoloft- which was added during this hospital stay instead of Prozac. Also address the effectiveness of Cialis for erectile dysfunction and try to arrange for Vaccum assisted device if felt needed( can't tolerate Cialis or is not effective or cost). - Also consider restarting hydralazine back as tolerated.  Also needs repeat chest x-ray in 4-6 weeks after discharge.  He will be closely followed by heart failure team as outpatient.  Follow-up Information    Follow up with Almyra Deforest, MD in 1 week.   Contact information:   [address] [place] [address] [phone]         Discharge Orders    Future Appointments: Provider: Department: Dept Phone: Center:   07/06/2011 11:00 AM Marinus Maw, MD [facility] [phone] [facility]     Future Orders Please Complete By Expires   Diet - low sodium heart healthy      Increase activity slowly      (HEART FAILURE PATIENTS) Call MD:  Anytime you have any of the following symptoms: 1) 3 pound weight gain in 24 hours or 5 pounds in 1 week 2) shortness of breath, with or without a dry hacking cough 3) swelling in the hands, feet or stomach 4) if you have to  sleep on extra pillows at night in order to breathe.      Call MD for:  extreme fatigue      Call MD for:  persistant dizziness or light-headedness      Call MD for:  hives      Call MD for:  difficulty breathing, headache or visual disturbances      Call MD for:  severe uncontrolled pain      Call MD for:  persistant nausea and vomiting      Call MD for:  temperature >100.4      Discharge instructions      Comments:   Please take your meds daily and follow up with Heart failure clinic and OPC. Use the coupon for discount for Cialis prescription.       DISCHARGE MEDICATIONS: Medication List  As of 06/13/2011  5:24 PM   STOP taking these medications         CRESTOR 10 MG tablet      FLUoxetine 40 MG capsule      hydrALAZINE 10 MG tablet         TAKE these medications         amitriptyline 25 MG tablet   Commonly known as: ELAVIL   Take 1 tablet (25 mg total) by mouth at bedtime.      benazepril 40 MG  tablet   Commonly known as: LOTENSIN   Take 0.5 tablets (20 mg total) by mouth daily.      carvedilol 3.125 MG tablet   Commonly known as: COREG   Take 2 tablets (6.25 mg total) by mouth 2 (two) times daily with a meal.      digoxin 0.25 MG tablet   Commonly known as: LANOXIN   Take 1 tablet (0.25 mg total) by mouth daily.      furosemide 80 MG tablet   Commonly known as: LASIX   40 mg daily.      gabapentin 300 MG capsule   Commonly known as: NEURONTIN   Take 3 capsules (900 mg total) by mouth 3 (three) times daily.      glipiZIDE 10 MG tablet   Commonly known as: GLUCOTROL   Take 1 tablet (10 mg total) by mouth daily.      guaiFENesin-dextromethorphan 100-10 MG/5ML syrup   Commonly known as: ROBITUSSIN DM   Take 5 mLs by mouth every 4 (four) hours as needed for cough.      HYDROcodone-acetaminophen 5-325 MG per tablet   Commonly known as: NORCO   Take 1-2 tablets by mouth every 6 (six) hours as needed for pain.      insulin glargine 100 UNIT/ML injection     Commonly known as: LANTUS   Inject 15 Units into the skin at bedtime.      levofloxacin 750 MG tablet   Commonly known as: LEVAQUIN   Take 1 tablet (750 mg total) by mouth daily.      rosuvastatin 10 MG tablet   Commonly known as: CRESTOR   Take 1 tablet (10 mg total) by mouth daily.      sertraline 50 MG tablet   Commonly known as: ZOLOFT   Take 1 tablet (50 mg total) by mouth at bedtime.      SUMAtriptan 50 MG tablet   Commonly known as: IMITREX   Take one tablet at the first symptom of a migraine headache.  may repeat a second dose in 1hr if headache persists.      tadalafil 5 MG tablet   Commonly known as: CIALIS   Take 1 tablet (5 mg total) by mouth daily as needed for erectile dysfunction.      terazosin 1 MG capsule   Commonly known as: HYTRIN   Take 1 capsule (1 mg total) by mouth at bedtime.      traMADol 50 MG tablet   Commonly known as: ULTRAM   Take 1 tablet (50 mg total) by mouth 2 (two) times daily as needed for pain.      zolpidem 5 MG tablet   Commonly known as: AMBIEN   Take 1 tablet (5 mg total) by mouth at bedtime as needed. For insomnia             CONSULTS:    Cardiology: Patient was admitted by cariology service with concern of cardiogenic shock versus pneumonia- and was transferred to our service midway through hospitalization- after he was not found to be in cardiogenic shock and was diagnosed with HAP. Patient's heart failure medications were slowly added and titrated up with help of cardiology. - He will be followed up closely with outpatient heart failure clinic.  PROCEDURES PERFORMED:  Dg Chest Port 1 View  06/09/2011  *RADIOLOGY REPORT*  Clinical Data: Shortness of breath.  Recent severe bronchitis.  PORTABLE CHEST - 1 VIEW 06/09/2011 1730 hours:  Comparison: Two-view chest x-ray 04/08/2011,  02/13/2011.  Findings: Cardiac silhouette moderately enlarged but stable.  Left subclavian pacing defibrillator unchanged.  New airspace opacities  in the medial right lung base.  Lungs hyperinflated but otherwise clear.  Mild pulmonary venous hypertension without overt edema. Prominent central pulmonary arteries, unchanged.  IMPRESSION: Pneumonia involving the right lung base.  Stable cardiomegaly without pulmonary edema.  Original Report Authenticated By: Arnell Sieving, M.D.       ADMISSION DATA: H&P: Mr. Dery is a 46 y/o male with HTN and DM2 with longstanding HF due to NICM (probably ETOH), EF 20%. Cath in 2008 showed left circumflex with 40% stenosis.  Last admission was in 03/2011 with NYHA Class IV symptoms, requiring milrinone therapy and IV lasix.  Cath 04/13/11: cardiogenic shock with biventricular failure (R>L) and possible restrictive physiology. RA = 22 with steep y-descents, RV = 45/12/25, PA = 51/25 (34), PCW = 18-20, Fick cardiac output/index = 3.1/1.4, Thermo CO/CI = 3.6/1.6, PVR = 4.8 Woods O2 sat = 85% (after sedation), PA sat = 31%, 36%. Milrinone was initiated for low output physiology. [name] was left in RIJ for hemodynamic monitoring and he was transferred to the CCU. Echo was done to re-evaluate RV function this showed LVEF 15%. RV moderately dilated with mildly reduced systolic function. RA and LA mildly dilated. PAPP 52 mmHG.LE dopplers ordered (negative for DVT). Digoxin, Ace-I and beta blocker started. Diuresed 33 pounds. [name] stopped due to hyperkalemia.  He presented to the clinic today for follow up. He says he feels terrible. He is not sleeping well. He is sleeping in a recliner, occ PND. Increased fatigue. Increased dyspnea with exertion. Gets hot and clammy and ends up throwing up. Nonproductive cough. +fever/chills. Urine dark over the last 2 week, thinks there may be some blood in it. Not weighing at home because he is depressed. He tried to follow up at an outpatient behavioral center but waited in line all day without seeing a provider. He says he missed his last appointment because [facility] did not come and pick  him up.     Physical Exam: General: Ill appearing. Diaphoretic. Hacking cough. Labored breathing  HEENT: normal  Neck: supple. JVP 9-10. Carotids 2+ bilaterally; no bruits. No lymphadenopathy or thryomegaly appreciated.  Cor: PMI normal. Tachycardic with Regular rhythm. +S3  Lungs: decreased BS LLB with + crackles  Abdomen: soft, nontender, nondistend. No hepatosplenomegaly. No bruits or masses. Good bowel sounds.  Extremities: no cyanosis, clubbing, rash, trace edema  Neuro: alert & orientedx3, cranial nerves grossly intact. Moves all 4 extremities w/o difficulty. Affect pleasant.   Labs: None   HOSPITAL COURSE:  Patient was admitted to step down on 06/09/2011, and CXR showed RLL PNA and stable cardiomegaly without pulmonary edma. He was placed on Vanc, Zosyn and empiric Milrinone . PICC placed 06/11/1011 to check co-ox ( 62.4) and CVP ( 7).  On 06/11/2011, HF team determins that co-ox and CVP look good and HF is probably not a major factor this time and main issue is PNA. Milrinone is stopped. He is improving with Vanc/zosyn, will continue for 24 hours, and plan to switch to Avelox on 06/12/2011.  Int. Med. Teaching Service is contacted today and will assume care on 06/12/2011. Cardiology  team followed along to help with HF issues.   #. HCAP  The clinical manifestation was consistent with the HCAP ( hospitalized in the end of March, 2013). He responded well to IV Vanc and Zosyn which were continued for 72 hours. Albuterol nebs and Robitussin  cough syrup was continued for symptomatic control. - patient was afebrile for about 48 hours before discharge and was clinically improved and so was switched to oral Levaquin 750 mg po daily 2 complete total 10 days of therapy. 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 His ACE inhibitor, beta blocker were added on day 2 and then Lasix was added back at low dose.  Although he gained about 8 pounds over 24 hours from 06/11/2011 to 06/11/2028 and so was given one extra dose of IV Lasix 40 mg before discharge- recommended per Dr. Tenny Craw ,cariology. His respiratory status was stable discharge. His hydralazine was not added. Will be reassessed in the clinic and can add it back as needed. He was discharged home on 80 mg daily Lasix.   # Depression  Major depression on Amtriptyline and Prozac. No SI/HI.  Had difficulty following up with his psychiatrist due to issues related to his insurance and transportation. SW was consulted during this admission.  Prozac was discontinued and Zoloft was added. Will be followed in clinic to see improvement in symptoms  ( He has been on Amtriptyline and Prozac for his depression. He was also put on short term Xanax, Ativan for his anxiety in the past. He was given Ambien for insomnia as well.)  - likely follow up with Psych as an outpatient.   # ED  Patient has a history of ED which was treated with Vardenafil and Viagra In August 2012. He stated that he has current ED problem. The etiology is likely related to his DM, CHF and BB.  - will arrange for Vacuum device as outpatient- per Dr. Rogelia Boga and Dr. Loistine Chance. - Cialis was prescribed to him with discount coupon- as per recommendations of Dr. Rogelia Boga. Patient has non-ischemic cardio myopathy and so physiologically would not have any side effects with Cialis use.  - Although pharmacist called back from [facility] after discharge - patient filled nitroglycerin prescription in March and so he was instructed to avoid using them in 4 hours interval.   # OSA, on CPAP  - sleep study done on 05/26/11  CPAP prescription with settings given for home. Home health will arrange for that.  # DM2, stable.  CBGs stable during hospital stay. Discharged on home regimen Glucotrol and Lantus.  # HTN, well controlled      DISCHARGE DATA: Vital Signs: BP 132/91  Pulse 9  Temp(Src) 97.3 F (36.3 C)  (Oral)  Resp 18  Ht 6' (1.829 m)  Wt 237 lb 7 oz (107.7 kg)  BMI 32.20 kg/m2  SpO2 98%  Labs: Results for orders placed during the hospital encounter of 06/09/11 (from the past 24 hour(s))  GLUCOSE, CAPILLARY     Status: Abnormal   Collection Time   06/12/11  9:20 PM      Component Value Range   Glucose-Capillary 134 (*) 70 - 99 (mg/dL)   Comment 1 Notify RN    GLUCOSE, CAPILLARY     Status: Normal   Collection Time   06/13/11  7:10 AM      Component Value Range   Glucose-Capillary 94  70 - 99 (mg/dL)   Comment 1 Notify RN    GLUCOSE, CAPILLARY     Status: Abnormal   Collection Time   06/13/11 10:55 AM      Component Value Range   Glucose-Capillary 192 (*) 70 -  99 (mg/dL)  PRO B NATRIURETIC PEPTIDE     Status: Abnormal   Collection Time   06/13/11 11:15 AM      Component Value Range   Pro B Natriuretic peptide (BNP) 3799.0 (*) 0 - 125 (pg/mL)  BASIC METABOLIC PANEL     Status: Abnormal   Collection Time   06/13/11 11:15 AM      Component Value Range   Sodium 132 (*) 135 - 145 (mEq/L)   Potassium 4.3  3.5 - 5.1 (mEq/L)   Chloride 100  96 - 112 (mEq/L)   CO2 25  19 - 32 (mEq/L)   Glucose, Bld 184 (*) 70 - 99 (mg/dL)   BUN 16  6 - 23 (mg/dL)   Creatinine, Ser 4.09  0.50 - 1.35 (mg/dL)   Calcium 8.9  8.4 - 81.1 (mg/dL)   GFR calc non Af Amer 73 (*) >90 (mL/min)   GFR calc Af Amer 84 (*) >90 (mL/min)    Total time spent on discharge: 35 minutes.  Signed: Lyn Hollingshead, MD PGY 2, Internal Medicine Resident 06/13/2011, 5:24 PM

## 2011-06-13 NOTE — Progress Notes (Signed)
Subjective: Patient still feels tired. Did not have good sleep as he uses Ambien at home and didn't get it in hospital. Although feels better than before. Has intermittent cough- which is little better. No nausea, vomiting, abdominal pain. Is able to eat good. Objective: Vital signs in last 24 hours: Filed Vitals:   06/12/11 2133 06/13/11 0020 06/13/11 0707 06/13/11 0711  BP: 141/114  132/91 132/91  Pulse: 106 113 98 98  Temp: 98.1 F (36.7 C)  97.3 F (36.3 C)   TempSrc: Oral  Oral   Resp: 18 18 18    Height:      Weight:   237 lb 7 oz (107.7 kg)   SpO2: 100% 99% 98%    Weight change:   Intake/Output Summary (Last 24 hours) at 06/13/11 0911 Last data filed at 06/13/11 0848  Gross per 24 hour  Intake   1690 ml  Output   1175 ml  Net    515 ml   General: anxious.  Neck: no JVD, and no carotid bruits.  Lungs:  RLL rales noted. No wheezing or rhonchi.  Heart: RRR. Abdomen: soft, BS x 4  Pulses: 2+ DP/PT pulses bilaterally Extremities: No cyanosis, clubbing, edema Neurologic: intact Skin: turgor normal and no rashes.    Lab Results: Basic Metabolic Panel:  Lab 06/12/11 1610 06/11/11 0530 06/09/11 1642  NA 139 136 --  K 4.7 3.8 --  CL 106 104 --  CO2 25 22 --  GLUCOSE 108* 96 --  BUN 14 12 --  CREATININE 1.27 1.11 --  CALCIUM 9.1 8.4 --  MG -- -- 2.3  PHOS -- -- --   Liver Function Tests:  Lab 06/09/11 1642  AST 18  ALT 15  ALKPHOS 137*  BILITOT 0.9  PROT 8.2  ALBUMIN 3.4*   CBC:  Lab 06/09/11 1642  WBC 7.8  NEUTROABS --  HGB 14.2  HCT 39.0  MCV 85.2  PLT 313   BNP:  Lab 06/10/11 0544  PROBNP 2537.0*   CBG:  Lab 06/13/11 0710 06/12/11 2120 06/12/11 1610 06/12/11 1102 06/12/11 0633 06/11/11 2122  GLUCAP 94 134* 104* 79 146* 117*    Urine Drug Screen: Drugs of Abuse     Component Value Date/Time   LABOPIA NEG 07/09/2010 1522   LABOPIA NONE DETECTED 03/30/2010 1607   COCAINSCRNUR NEG 07/09/2010 1522   COCAINSCRNUR NONE DETECTED 03/30/2010  1607   LABBENZ NEG 07/09/2010 1522   LABBENZ NONE DETECTED 03/30/2010 1607   AMPHETMU NEG 07/09/2010 1522   AMPHETMU NONE DETECTED 03/30/2010 1607   THCU POSITIVE* 03/30/2010 1607   LABBARB  Value: NONE DETECTED        DRUG SCREEN FOR MEDICAL PURPOSES ONLY.  IF CONFIRMATION IS NEEDED FOR ANY PURPOSE, NOTIFY LAB WITHIN 5 DAYS.        LOWEST DETECTABLE LIMITS FOR URINE DRUG SCREEN Drug Class       Cutoff (ng/mL) Amphetamine      1000 Barbiturate      200 Benzodiazepine   200 Tricyclics       300 Opiates          300 Cocaine          300 THC              50 03/30/2010 1607    Urinalysis:  Lab 06/09/11 2250  COLORURINE AMBER*  LABSPEC 1.029  PHURINE 6.0  GLUCOSEU 100*  HGBUR TRACE*  BILIRUBINUR MODERATE*  KETONESUR 15*  PROTEINUR >300*  UROBILINOGEN 4.0*  NITRITE NEGATIVE  LEUKOCYTESUR TRACE*    Micro Results: Recent Results (from the past 240 hour(s))  MRSA PCR SCREENING     Status: Normal   Collection Time   06/09/11  4:26 PM      Component Value Range Status Comment   MRSA by PCR NEGATIVE  NEGATIVE  Final   CULTURE, BLOOD (ROUTINE X 2)     Status: Normal (Preliminary result)   Collection Time   06/09/11  4:41 PM      Component Value Range Status Comment   Specimen Description BLOOD LEFT ARM   Final    Special Requests BOTTLES DRAWN AEROBIC ONLY 10CC   Final    Culture  Setup Time [id number]   Final    Culture     Final    Value:        BLOOD CULTURE RECEIVED NO GROWTH TO DATE CULTURE WILL BE HELD FOR 5 DAYS BEFORE ISSUING A FINAL NEGATIVE REPORT   Report Status PENDING   Incomplete   CULTURE, BLOOD (ROUTINE X 2)     Status: Normal (Preliminary result)   Collection Time   06/09/11  4:44 PM      Component Value Range Status Comment   Specimen Description BLOOD LEFT HAND   Final    Special Requests     Final    Value: BOTTLES DRAWN AEROBIC AND ANAEROBIC 8CC AER 7CC ANA   Culture  Setup Time [id number]   Final    Culture     Final    Value:        BLOOD CULTURE RECEIVED NO  GROWTH TO DATE CULTURE WILL BE HELD FOR 5 DAYS BEFORE ISSUING A FINAL NEGATIVE REPORT   Report Status PENDING   Incomplete    Studies/Results: No results found. Medications: I have reviewed the patient's current medications. Scheduled Meds:    . amitriptyline  25 mg Oral QHS  . atorvastatin  20 mg Oral q1800  . benazepril  20 mg Oral Daily  . carvedilol  6.25 mg Oral BID AC  . diazepam  5 mg Oral Once  . digoxin  0.25 mg Oral Daily  . diphenhydrAMINE  25 mg Intravenous Once  . furosemide  40 mg Intravenous Once  . furosemide  40 mg Oral Daily  . gabapentin  300 mg Oral TID  . glipiZIDE  10 mg Oral Q breakfast  . heparin  5,000 Units Subcutaneous Q8H  . insulin aspart  0-15 Units Subcutaneous TID WC  . insulin aspart  0-5 Units Subcutaneous QHS  . levofloxacin  750 mg Oral Daily  . piperacillin-tazobactam (ZOSYN)  IV  3.375 g Intravenous Q8H  . piperacillin-tazobactam (ZOSYN)  IV  3.375 g Intravenous Q8H  . sertraline  50 mg Oral QHS  . sodium chloride  10-40 mL Intracatheter Q12H  . sodium chloride  3 mL Intravenous Q12H  . vancomycin  1,000 mg Intravenous Once  . DISCONTD: carvedilol  3.125 mg Oral BID AC  . DISCONTD: FLUoxetine  40 mg Oral Daily  . DISCONTD: moxifloxacin  400 mg Oral q1800   Continuous Infusions:  PRN Meds:.sodium chloride, acetaminophen, albuterol, diphenhydrAMINE, guaiFENesin-dextromethorphan, HYDROcodone-acetaminophen, ondansetron (ZOFRAN) IV, sodium chloride, sodium chloride, traMADol Assessment/Plan:  # disposition  - likely discharge in the afternoon. Will follow his urine output and lab test this morning and blood pressure - if everything looks okay, he will go home today. -  HF team gradually titrate his medications, added BB and lasix yesterday >> 1 dose  of IV Lasix per Dr. Tenny Craw as patient gained weight over night.  - arrange HH RN/SW/CPAP - no need for HHPT, however, may benefit from cardiac rehab if HF team agrees.   #. HCAP  The clinical  manifestation is consistent with the HCAP ( hospitalized in the end of March, 2013). He has improved on IV Vanc and Zosyn since admission.   - continue Neb, ABx and PRN cough meds.  -  Levaquin 750 mg po daily x 7 days starting from today to give patient a total of 10 days treatment.  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   # Depression  Major depression on Amtriptyline and Prozac. No SI/HI.  He was anxious when I met him this am. He reports that Prozac does not work for him.  Had difficulty following up with his psychiatrist due to issues related to his insurance and transportation. SW is consulted during this admission.   - SW re-consulted on assisting to find another Psych service.>>>SW will discuss with patient today. - will give him one dose Benzo for his anxiety now. Patient was on Xanax and Ativan in the past and stated that they did not work for him. - Valium 5 mg po x 1 dose - will D/C Prozac - will switch to another SSRI Zoloft ( I have thoroughly studied his medication history. He has been on Amtriptyline and Prozac for his depression. He was also put on short term Xanax, Ativan for his anxiety in the past. He was given Ambien for insomnia as well.) - likely follow up with Psych as an outpatient.    # ED    Patient has a history of ED which was treated with Vardenafil and Viagra In August 2012. He states that he has current ED problem. The etiology is likely related to his DM, CHF and BB.  - will consult CM for assistance on Vacuum device - caution initiation of any medicine due to his compromised cardiac function.  # OSA, on CPAP  - sleep study done on 05/26/11 - CM consulted for home CPAP machine   # DM2, stable, on glucotrol and SSI, lantus on hold  - patient states that he takes Glucotrol daily am and would only take Lantus insulin 15 units at bedtime if his CBG >  125. - Last HGB A1C 6.9 in March 2013, will resume his regimen upon discharge.  # HTN, well controlled   # [id number]   LOS: 4 days   Falecia Vannatter 06/13/2011, 9:11 AM

## 2011-06-15 ENCOUNTER — Encounter: Payer: Self-pay | Admitting: Internal Medicine

## 2011-06-15 ENCOUNTER — Telehealth: Payer: Self-pay | Admitting: Internal Medicine

## 2011-06-15 MED ORDER — DULOXETINE HCL 30 MG PO CPEP: ORAL_CAPSULE | ORAL | Status: DC

## 2011-06-15 NOTE — Telephone Encounter (Signed)
Called pt. Good spirits.  1. Anti-dep - Pt started on Zoloft in hospital. Stated caused him to talk in sleep to people and would wake up and they were not there. It continued the first night home and wife also noticed this. He did not fill teh Rx and will not take it. I can't find a single med for depression and GAD that doesn't interact with other meds, increase risk of serotonin syndrome, interact with imitrex. I think his dep and anxiety are the driving factors for many of his issues. Therefore the risks are less than the potential benefits.  2. Got the Cialis. Was free with the coupon. Hasn't tried it now or in past to know if it works. Still wants [place] to try to get teh vacuum assist device.  3. CHF - weight was 233 today. States will weight daily  4. [facility] - informed him I hadn't heard back  5. F/U - informed Dr Loistine Chance has no openings in June but that we weill get him sch with her July, Aug and Sep. He has ABD cramping but no diarrhea since went to cookout this weekend with brother. Gave his wife the number to call if still not better in AM.

## 2011-06-15 NOTE — Telephone Encounter (Signed)
Pt called and will get med.

## 2011-06-16 LAB — CULTURE, BLOOD (ROUTINE X 2): Culture: NO GROWTH

## 2011-06-17 ENCOUNTER — Telehealth: Payer: Self-pay | Admitting: Internal Medicine

## 2011-06-17 NOTE — Telephone Encounter (Signed)
I set up a hospital follow up visit for patient at at 215 pm on 06/25/11 with Dr. Eben Burow. I have called patient and informed his appointment. Patient states that he would arrive on time.

## 2011-06-23 ENCOUNTER — Telehealth: Payer: Self-pay | Admitting: Licensed Clinical Social Worker

## 2011-06-23 NOTE — Telephone Encounter (Signed)
Pt received voice message from pt in need of transportation to Friday 6/7 [facility] appt. [name] states he has left message with several workers regarding his transportation benefits, CSW unable to determine which agency pt was calling or what application had been submitted.  Pt unaware.  Pt states he was receiving care management from [facility] but was d/c from program, unclear as to why [facility] d/c him from program.  Pt states he did not want to wait all day for a psych appt at [facility].  Pt states he was told it would take 4 hrs, but waited longer and "he had children to take care of".  Pt was under the impression his medicare benefits paid for transportation.  CSW informed pt Medicaid pays for transportation for some clients but Medicare does not cover transport to [facility] appt.  CSW also informed pt Medicaid application will take up to 45 days to show in [place] system. CSW discussed [employer] application and cost of transport.  Pt states "after paying my bills, I don't have even $1.50".   CSW informed pt that if he was able to get to appt on 6/7, CSW had bus pass for pt to return home. CSW placed call and left message for Pam. F/[facility] care manager if pt could be re-assessed for acceptance into program.

## 2011-06-25 ENCOUNTER — Encounter: Payer: Self-pay | Admitting: Internal Medicine

## 2011-06-25 ENCOUNTER — Ambulatory Visit (INDEPENDENT_AMBULATORY_CARE_PROVIDER_SITE_OTHER): Payer: Medicare Other | Admitting: Internal Medicine

## 2011-06-25 ENCOUNTER — Other Ambulatory Visit: Payer: Self-pay | Admitting: Internal Medicine

## 2011-06-25 VITALS — BP 108/74 | HR 93 | Temp 97.1°F | Wt 233.0 lb

## 2011-06-25 DIAGNOSIS — M545 Low back pain: Secondary | ICD-10-CM

## 2011-06-25 DIAGNOSIS — N189 Chronic kidney disease, unspecified: Secondary | ICD-10-CM

## 2011-06-25 DIAGNOSIS — I5023 Acute on chronic systolic (congestive) heart failure: Secondary | ICD-10-CM

## 2011-06-25 DIAGNOSIS — M549 Dorsalgia, unspecified: Secondary | ICD-10-CM

## 2011-06-25 DIAGNOSIS — N179 Acute kidney failure, unspecified: Secondary | ICD-10-CM

## 2011-06-25 NOTE — Progress Notes (Signed)
  Subjective:    Patient ID: [name], male    DOB: [birth year], 46 y.o.   MRN: [id number]  HPI  Patient is here today for a hospistal follow up.  1. CHF: Will obtain BMP today. No shortness of breath, chest pain, swelling or fluid overload noted.  2. Back pain and headache: Patient was taking vicodin and requests a refill. His UDS was positive for marijuana and that is the reason he was taken off it. He denies tramadol, tylenol or any other NSAIDS at this time.  3. Patient started taking his hydralazine back but only taking 1 tab 10 mg a day. I changed it to 3 times  A day and cleaned his medication list up.  4. Patient says that cialis is working well for him.  No other complaints.  I spent >15 minutes sorting out his medications.   Review of Systems  Constitutional: Negative for fever, activity change and appetite change.  HENT: Negative for sore throat.   Respiratory: Negative for cough and shortness of breath.   Cardiovascular: Negative for chest pain and leg swelling.  Gastrointestinal: Negative for nausea, abdominal pain, diarrhea, constipation and abdominal distention.  Genitourinary: Negative for frequency, hematuria and difficulty urinating.  Musculoskeletal: Positive for back pain.  Neurological: Positive for headaches. Negative for dizziness.  Psychiatric/Behavioral: Negative for suicidal ideas and behavioral problems.       Objective:   Physical Exam  Constitutional: He is oriented to person, place, and time. He appears well-developed and well-nourished.  HENT:  Head: Normocephalic and atraumatic.  Eyes: Conjunctivae and EOM are normal. Pupils are equal, round, and reactive to light. No scleral icterus.  Neck: Normal range of motion. Neck supple. No JVD present. No thyromegaly present.  Cardiovascular: Normal rate, regular rhythm, normal heart sounds and intact distal pulses.  Exam reveals no gallop and no friction rub.   No murmur heard. Pulmonary/Chest:  Effort normal and breath sounds normal. No respiratory distress. He has no wheezes. He has no rales.  Abdominal: Soft. Bowel sounds are normal. He exhibits no distension and no mass. There is no tenderness. There is no rebound and no guarding.  Musculoskeletal: Normal range of motion. He exhibits no edema and no tenderness.  Lymphadenopathy:    He has no cervical adenopathy.  Neurological: He is alert and oriented to person, place, and time.  Psychiatric: He has a normal mood and affect. His behavior is normal.          Assessment & Plan:

## 2011-06-25 NOTE — Assessment & Plan Note (Addendum)
Patient wants narcotic pain medications for his back pain.  On pointing out that he has been using marijuana which is a contraindication to prescribe narcotics from clinic he said that who gave [place] permission to check his urine for drugs. He would not take tylenol or tramadol for his back pain as they do not work. Patient was angry and left the clinic without any blood draw. Patient was given instructions to relieve back pain with conservative measures.

## 2011-06-25 NOTE — Progress Notes (Signed)
Called pt. Upset that he didn't get Hydrocodone. Explained that we are strict with chronic opioids and he doesn't have a dx to support use of opioids. He back pain is throbbing and worse when he bends over, squats down, or gets up out of bed. Radiation up spine but not too either leg. Not constant. Feels like back needs to pop but can't get it to pop. I agree with Dr Eben Burow to be extremely cautious with opioids as he has so many red flags and I can see opioids being a disaster. Cymbalta was just started that might help with chronic pain. Pt willing to go to PT if we can help him with transportation. Will ask Edson Snowball to help and will call [facility] Monday to see where they are with his status.   He has daily HA and has been tried on many meds per pt. Again, opioids may make this worse. Will need to investigate this further to see what can be done. Has been tried on abortive therapies. I am resistant to using an anticonvulsant bc of his co-morbidities and I think his mental status (depression / other undx conditions) play highly into all of his pain syndromes. Could consider Topamax or referral to pysch to help find one med for HA and pysch.  Cialis not helping. Will procede with erectile vacuum devise.

## 2011-06-25 NOTE — Assessment & Plan Note (Signed)
Patient seems to be well compensated at this time. I pointed to him correct dose of hydralazine which is 10 mg 3 times a day which was originally prescribed. He is on BB and ace inhibitor at this time. BP is well controlled. No new changes today.

## 2011-06-25 NOTE — Assessment & Plan Note (Signed)
Patient is refusing BMP draw today as he is not very pleased with [place] not prescribing him narcotic pain meds. I explained the risks to him and he understands that untreated electrolyte abnormalities can result in various cardiac problems including heart attack.

## 2011-06-25 NOTE — Patient Instructions (Addendum)
Back Pain, Adult Low back pain is very common. About 1 in 5 people have back pain.The cause of low back pain is rarely dangerous. The pain often gets better over time.About half of people with a sudden onset of back pain feel better in just 2 weeks. About 8 in 10 people feel better by 6 weeks.  CAUSES Some common causes of back pain include:  Strain of the muscles or ligaments supporting the spine.   Wear and tear (degeneration) of the spinal discs.   Arthritis.   Direct injury to the back.  DIAGNOSIS Most of the time, the direct cause of low back pain is not known.However, back pain can be treated effectively even when the exact cause of the pain is unknown.Answering your caregiver's questions about your overall health and symptoms is one of the most accurate ways to make sure the cause of your pain is not dangerous. If your caregiver needs more information, he or she may order lab work or imaging tests (X-rays or MRIs).However, even if imaging tests show changes in your back, this usually does not require surgery. HOME CARE INSTRUCTIONS For many people, back pain returns.Since low back pain is rarely dangerous, it is often a condition that people can learn to manageon their own.   Remain active. It is stressful on the back to sit or stand in one place. Do not sit, drive, or stand in one place for more than 30 minutes at a time. Take short walks on level surfaces as soon as pain allows.Try to increase the length of time you walk each day.   Do not stay in bed.Resting more than 1 or 2 days can delay your recovery.   Do not avoid exercise or work.Your body is made to move.It is not dangerous to be active, even though your back may hurt.Your back will likely heal faster if you return to being active before your pain is gone.   Pay attention to your body when you bend and lift. Many people have less discomfortwhen lifting if they bend their knees, keep the load close to their  bodies,and avoid twisting. Often, the most comfortable positions are those that put less stress on your recovering back.   Find a comfortable position to sleep. Use a firm mattress and lie on your side with your knees slightly bent. If you lie on your back, put a pillow under your knees.   Only take over-the-counter or prescription medicines as directed by your caregiver. Over-the-counter medicines to reduce pain and inflammation are often the most helpful.Your caregiver may prescribe muscle relaxant drugs.These medicines help dull your pain so you can more quickly return to your normal activities and healthy exercise.   Put ice on the injured area.   Put ice in a plastic bag.   Place a towel between your skin and the bag.   Leave the ice on for 15 to 20 minutes, 3 to 4 times a day for the first 2 to 3 days. After that, ice and heat may be alternated to reduce pain and spasms.   Ask your caregiver about trying back exercises and gentle massage. This may be of some benefit.   Avoid feeling anxious or stressed.Stress increases muscle tension and can worsen back pain.It is important to recognize when you are anxious or stressed and learn ways to manage it.Exercise is a great option.  SEEK MEDICAL CARE IF:  You have pain that is not relieved with rest or medicine.   You have   pain that does not improve in 1 week.   You have new symptoms.   You are generally not feeling well.  SEEK IMMEDIATE MEDICAL CARE IF:   You have pain that radiates from your back into your legs.   You develop new bowel or bladder control problems.   You have unusual weakness or numbness in your arms or legs.   You develop nausea or vomiting.   You develop abdominal pain.   You feel faint.  Document Released: 01/04/2005 Document Revised: 12/24/2010 Document Reviewed: 05/25/2010 [employer] Patient Information 2012 [employer]. 

## 2011-06-28 ENCOUNTER — Other Ambulatory Visit: Payer: Self-pay | Admitting: Internal Medicine

## 2011-06-28 ENCOUNTER — Telehealth: Payer: Self-pay | Admitting: Internal Medicine

## 2011-06-28 DIAGNOSIS — R51 Headache: Secondary | ICD-10-CM
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
## 2011-06-29 ENCOUNTER — Telehealth: Payer: Self-pay | Admitting: Licensed Clinical Social Worker

## 2011-06-29 ENCOUNTER — Telehealth: Payer: Self-pay | Admitting: *Deleted

## 2011-06-29 NOTE — Telephone Encounter (Signed)
After discussion with PCP, pt does not want to pursue continuation of services through [facility].  CSW proceeded with scheduling psychiatry appointment for Mr. Paparella.  Mr. Mcbain states he has applied for Medicaid during is hospitalization but has yet to hear the determination, it has been less than 45 days.  CSW informed pt he may be eligible for transportation with Medicaid.  Pt's appointment is with [facility], [address] at 9:30 am, pt will need to arrive at 9:00 am.  CSW called pt to notify of appt, but did not have pen/paper available and requesting CSW to send notification in the mail.  Letter will be mailed.

## 2011-06-29 NOTE — Telephone Encounter (Signed)
Pt called again today to let you know he will not be [facility].  He needed a co-pay of $40 and does not have that money.  He is asking for hydrocodone to last until appointment.  Headaches are daily and he is stressed. He has talked with Dr Midwife about this problem so I will send to her. Pt # [id number]

## 2011-06-30 ENCOUNTER — Ambulatory Visit (HOSPITAL_COMMUNITY)
Admission: RE | Admit: 2011-06-30 | Discharge: 2011-06-30 | Disposition: A | Discharge status: Home / Self Care | Payer: Medicare Other | Source: Ambulatory Visit | Attending: Internal Medicine | Admitting: Internal Medicine

## 2011-06-30 VITALS — BP 104/76 | HR 86 | Wt 236.0 lb

## 2011-06-30 DIAGNOSIS — R0602 Shortness of breath: Secondary | ICD-10-CM | POA: Insufficient documentation

## 2011-06-30 DIAGNOSIS — K219 Gastro-esophageal reflux disease without esophagitis: Secondary | ICD-10-CM | POA: Insufficient documentation

## 2011-06-30 DIAGNOSIS — E119 Type 2 diabetes mellitus without complications: Secondary | ICD-10-CM | POA: Insufficient documentation

## 2011-06-30 DIAGNOSIS — F329 Major depressive disorder, single episode, unspecified: Secondary | ICD-10-CM | POA: Insufficient documentation

## 2011-06-30 DIAGNOSIS — F3289 Other specified depressive episodes: Secondary | ICD-10-CM | POA: Insufficient documentation

## 2011-06-30 DIAGNOSIS — I10 Essential (primary) hypertension: Secondary | ICD-10-CM | POA: Insufficient documentation

## 2011-06-30 DIAGNOSIS — F1011 Alcohol abuse, in remission: Secondary | ICD-10-CM | POA: Insufficient documentation

## 2011-06-30 DIAGNOSIS — F1210 Cannabis abuse, uncomplicated: Secondary | ICD-10-CM | POA: Insufficient documentation

## 2011-06-30 DIAGNOSIS — Z95810 Presence of automatic (implantable) cardiac defibrillator: Secondary | ICD-10-CM | POA: Insufficient documentation

## 2011-06-30 DIAGNOSIS — K279 Peptic ulcer, site unspecified, unspecified as acute or chronic, without hemorrhage or perforation: Secondary | ICD-10-CM | POA: Insufficient documentation

## 2011-06-30 DIAGNOSIS — I509 Heart failure, unspecified: Secondary | ICD-10-CM | POA: Insufficient documentation

## 2011-06-30 DIAGNOSIS — G4733 Obstructive sleep apnea (adult) (pediatric): Secondary | ICD-10-CM | POA: Insufficient documentation

## 2011-06-30 DIAGNOSIS — I5022 Chronic systolic (congestive) heart failure: Secondary | ICD-10-CM | POA: Insufficient documentation

## 2011-06-30 DIAGNOSIS — Z8701 Personal history of pneumonia (recurrent): Secondary | ICD-10-CM | POA: Insufficient documentation

## 2011-06-30 DIAGNOSIS — I428 Other cardiomyopathies: Secondary | ICD-10-CM | POA: Insufficient documentation

## 2011-06-30 DIAGNOSIS — Z794 Long term (current) use of insulin: Secondary | ICD-10-CM | POA: Insufficient documentation

## 2011-06-30 MED ORDER — CYCLOBENZAPRINE HCL 10 MG PO TABS: 10.0000 mg | ORAL_TABLET | Freq: Three times a day (TID) | ORAL | Status: DC | PRN

## 2011-06-30 NOTE — Telephone Encounter (Signed)
I have already told him that I cannot / will not give him opioids for a chronic daily HA as it is not indicated. Dr Eben Burow has also told him this. I know this is going to make him quite unhappy but I do not want to start down that road knowing it will create issues in the future (the expectation of opioid issues) and knowing that the HA is not going to Rx opioids. I am sorry about the co-pay and wait (anyway to get it moved up) but I have to do what is medically indicated. Are you willing to pass along the message?  Dr Loistine Chance you are his PCP and have every right to override me and take him on as a chronic opioid pt if you feel that is indicated. You have an appt with him in July but I do not know the date.

## 2011-06-30 NOTE — Telephone Encounter (Signed)
I agree with Dr Rogelia Boga . I would not recommend any opoid therapy for him.

## 2011-06-30 NOTE — Telephone Encounter (Signed)
He has tried triptans, tramadol, elavil (but only low dose), BB for preventative all of which were ineffective. I sent in Flexeril but I can't imagine it will be effective either. Many of the preventative meds that I would attempt to use are CI with almost all anti-dep, inc the Cymbalta that I have him on which I hope will help with his chronic pain syndrome. Chronic daily HA are hard to treat which is why I am resorting to the HA clinic. Problem is that they will not use opioids and their tx will hopefully work but will not be an instant fix either. I know he is frustrated and I know he is doing all he can to comply and help himself. Opioids would be an easy fix now but a horrible choice in the long run.   Thanks for helping out!

## 2011-06-30 NOTE — Telephone Encounter (Signed)
Pt happy to try flexeril. Will let us know how it works.

## 2011-06-30 NOTE — Telephone Encounter (Signed)
Pt called. No answer and no answer machine. Will try again later in the day.

## 2011-06-30 NOTE — Assessment & Plan Note (Addendum)
He is complaining of a persistent headache and he does not want to continue to take his medications nor does he want to check lab work today.  I have suggested social work for coping strategies, however he declines follow up. I recommended follow with his PCP but he declines. I have offered a hospice referral for symptom management and he has agreed. Referral made to [facility]. Will not adjust any medications due to his desire to stop all medications. Greater than 30 minutes spent with [name] to determine his goals.   Follow up in 1 month if he elects to return.

## 2011-06-30 NOTE — Progress Notes (Signed)
Patient ID: [name], male   DOB: 1965-07-28, 46 y.o.   MRN: [id number] HPI: [name] is a 47 y/o male with HTN and DM2 with longstanding HF due to NICM (probably ETOH), EF 20%. Cath in 2008 showed left circumflex with 40% stenosis.  Admitted 03/2010 with NYHA Class IV symptoms, requiring milrinone therapy and IV lasix. Cath 04/13/11: cardiogenic shock with biventricular failure (R>L) and possible restrictive physiology. RA = 22 with steep y-descents, RV = 45/12/25, PA = 51/25 (34), PCW = 18-20, Fick cardiac output/index = 3.1/1.4, Thermo CO/CI = 3.6/1.6, PVR = 4.8 Woods O2 sat = 85% (after sedation), PA sat = 31%, 36%. Milrinone was initiated for low output physiology. [name] was left in RIJ for hemodynamic monitoring and he was transferred to the CCU. Echo was done to re-evaluate RV function this showed LVEF 15%. RV moderately dilated with mildly reduced systolic function. RA and LA mildly dilated. PAPP 52 mmHG.LE dopplers ordered (negative for DVT). Digoxin, Ace-I and beta blocker started. Diuresed 33 liters. Discharge weight 214 pounds.   [name] stopped due to hyperkalemia.   D/C from [facility] 06/13/11 due to pneumonia.   He is here for follow up today. He is unable to sleep. Complains of headache. (has follow up with headache physician however he doesn't want to go)Denies SOB/PND/Orthopnea. Smokes marijuana weekly. Continues to have erectile dysfunction. Says that he does not want to continue to take medications and would like to just die.  Denies suicidal ideation. He has 2 children and a wife at home. He is not weighing daily.     ROS: All systems negative except as listed in HPI, PMH and Problem List.  Past Medical History  Diagnosis Date  . Hypertension   . 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 History of peptic ulcer disease     EGD 09/2007 shows prox gastric ulcer with black eschar, treated  with PPI  . Depression   . History of alcohol abuse     quit approx 2011-2012  . Angina   . Shortness of breath   . OSA (obstructive sleep apnea)     sleeps with oxygen  . Cardiomyopathy   . ICD (implantable cardiac defibrillator) in place   . Diabetes mellitus     insulin dependent  . GERD (gastroesophageal reflux disease)     Current Outpatient Prescriptions  Medication Sig Dispense Refill  . benazepril (LOTENSIN) 40 MG tablet Take 0.5 tablets (20 mg total) by mouth daily.  30 tablet  2  . carvedilol (COREG) 3.125 MG tablet Take 2 tablets (6.25 mg total) by mouth 2 (two) times daily with a meal.  60 tablet  6  . digoxin (LANOXIN) 0.25 MG tablet Take 1 tablet (0.25 mg total) by mouth daily.  30 tablet  6  . DULoxetine (CYMBALTA) 30 MG capsule Take one pill (30mg ) daily for one week. Then take two pills (total of 60 mg) together once daily.  60 capsule  2  . furosemide (LASIX) 80 MG tablet 40 mg daily.       Marland Kitchen gabapentin (NEURONTIN) 300 MG capsule Take 3 capsules (900 mg total) by mouth 3 (three) times daily.  270 capsule  3  . glipiZIDE (GLUCOTROL) 10 MG tablet Take 1 tablet (10 mg total) by mouth daily.  30 tablet  3  . hydrALAZINE (APRESOLINE) 10 MG tablet Take 10 mg by mouth  3 (three) times daily.      . insulin glargine (LANTUS) 100 UNIT/ML injection Inject 15 Units into the skin at bedtime.  10 mL  3  . omeprazole (PRILOSEC) 20 MG capsule Take 20 mg by mouth daily.      . rosuvastatin (CRESTOR) 10 MG tablet Take 1 tablet (10 mg total) by mouth daily.  30 tablet  3  . tadalafil (CIALIS) 5 MG tablet Take 1 tablet (5 mg total) by mouth daily as needed for erectile dysfunction.  30 tablet  0  . zolpidem (AMBIEN) 5 MG tablet Take 1 tablet (5 mg total) by mouth at bedtime as needed. For insomnia  30 tablet  1  . DISCONTD: metoprolol tartrate (LOPRESSOR) 25 MG tablet Take 1 tablet (25 mg total) by mouth 2 (two) times daily.  60 tablet  1  . DISCONTD: traZODone (DESYREL) 25 mg TABS Take  0.5 tablets (25 mg total) by mouth at bedtime.  30 tablet  1     PHYSICAL EXAM: Filed Vitals:   06/30/11 1130  BP: 104/76  Pulse: 86   Weight change:  General:  Flat affect. Chronically appearing. No resp difficulty HEENT: normal Neck: supple. JVP flat. Carotids 2+ bilaterally; no bruits. No lymphadenopathy or thryomegaly appreciated. Cor: PMI normal. Regular rate & rhythm. No rubs, gallops or murmurs. Lungs: clear Abdomen: soft, nontender, nondistended. No hepatosplenomegaly. No bruits or masses. Good bowel sounds. Extremities: no cyanosis, clubbing, rash, edema Neuro: alert & orientedx3, cranial nerves grossly intact. Moves all 4 extremities w/o difficulty. Affect pleasant.    ECG:   ASSESSMENT & PLAN:

## 2011-06-30 NOTE — Patient Instructions (Addendum)
Follow up in 1 month   

## 2011-06-30 NOTE — Telephone Encounter (Signed)
Talked with pt again and informed him no opioids.  He states he understands but what else can he take? He needs relief from this pain.

## 2011-07-06 ENCOUNTER — Ambulatory Visit: Payer: Medicare Other | Admitting: Internal Medicine

## 2011-07-06 ENCOUNTER — Encounter: Payer: Medicare Other | Admitting: Internal Medicine

## 2011-07-08 ENCOUNTER — Ambulatory Visit: Payer: Medicare Other | Attending: Internal Medicine

## 2011-07-08 DIAGNOSIS — M545 Low back pain, unspecified: Secondary | ICD-10-CM | POA: Insufficient documentation

## 2011-07-08 DIAGNOSIS — R5381 Other malaise: Secondary | ICD-10-CM | POA: Insufficient documentation

## 2011-07-08 DIAGNOSIS — IMO0001 Reserved for inherently not codable concepts without codable children: Secondary | ICD-10-CM | POA: Insufficient documentation

## 2011-07-08 DIAGNOSIS — M2560 Stiffness of unspecified joint, not elsewhere classified: Secondary | ICD-10-CM | POA: Insufficient documentation

## 2011-07-08 DIAGNOSIS — M542 Cervicalgia: Secondary | ICD-10-CM | POA: Insufficient documentation

## 2011-07-12 ENCOUNTER — Other Ambulatory Visit: Payer: Self-pay | Admitting: Internal Medicine

## 2011-07-12 DIAGNOSIS — G43909 Migraine, unspecified, not intractable, without status migrainosus: Secondary | ICD-10-CM

## 2011-07-13 ENCOUNTER — Other Ambulatory Visit: Payer: Self-pay | Admitting: Internal Medicine

## 2011-07-13 DIAGNOSIS — Z8639 Personal history of other endocrine, nutritional and metabolic disease: Secondary | ICD-10-CM

## 2011-07-14 ENCOUNTER — Encounter: Payer: Self-pay | Admitting: Internal Medicine

## 2011-07-15 DIAGNOSIS — R0602 Shortness of breath: Secondary | ICD-10-CM

## 2011-07-15 DIAGNOSIS — R109 Unspecified abdominal pain: Secondary | ICD-10-CM

## 2011-07-15 DIAGNOSIS — I426 Alcoholic cardiomyopathy: Secondary | ICD-10-CM

## 2011-07-15 DIAGNOSIS — F1020 Alcohol dependence, uncomplicated: Secondary | ICD-10-CM

## 2011-07-19 ENCOUNTER — Ambulatory Visit: Payer: Medicare Other | Admitting: Physical Therapy

## 2011-07-20 ENCOUNTER — Ambulatory Visit: Payer: Medicare Other | Attending: Internal Medicine | Admitting: Physical Therapy

## 2011-07-20 DIAGNOSIS — M2560 Stiffness of unspecified joint, not elsewhere classified: Secondary | ICD-10-CM | POA: Insufficient documentation

## 2011-07-20 DIAGNOSIS — M542 Cervicalgia: Secondary | ICD-10-CM | POA: Insufficient documentation

## 2011-07-20 DIAGNOSIS — R5381 Other malaise: Secondary | ICD-10-CM | POA: Insufficient documentation

## 2011-07-20 DIAGNOSIS — IMO0001 Reserved for inherently not codable concepts without codable children: Secondary | ICD-10-CM | POA: Insufficient documentation

## 2011-07-20 DIAGNOSIS — M545 Low back pain, unspecified: Secondary | ICD-10-CM | POA: Insufficient documentation

## 2011-07-26 ENCOUNTER — Encounter: Payer: Medicare Other | Admitting: Physical Therapy

## 2011-07-27 ENCOUNTER — Telehealth (HOSPITAL_BASED_OUTPATIENT_CLINIC_OR_DEPARTMENT_OTHER): Payer: Medicare Other | Admitting: *Deleted

## 2011-07-27 DIAGNOSIS — E119 Type 2 diabetes mellitus without complications: Secondary | ICD-10-CM

## 2011-07-27 NOTE — Telephone Encounter (Signed)
Jan from hospice called in regards to [name]. He needs diabetic test strips for his Presto machine and insulin syringes for 15 cc lantus called into his pharmacy, [facility].  You can contact the patient when the order is sent in. Thank you.

## 2011-07-28 MED ORDER — INSULIN SYRINGES (DISPOSABLE) U-100 0.3 ML MISC: 15.0000 [IU] | Freq: Every day | Status: DC

## 2011-07-29 ENCOUNTER — Ambulatory Visit: Payer: Medicare Other | Admitting: Physical Therapy

## 2011-07-30 ENCOUNTER — Telehealth (HOSPITAL_COMMUNITY): Payer: Self-pay | Admitting: *Deleted

## 2011-07-30 ENCOUNTER — Encounter (HOSPITAL_COMMUNITY): Payer: Self-pay | Admitting: Psychiatry

## 2011-07-30 ENCOUNTER — Ambulatory Visit (INDEPENDENT_AMBULATORY_CARE_PROVIDER_SITE_OTHER): Payer: Medicare Other | Admitting: Psychiatry

## 2011-07-30 VITALS — BP 120/102 | HR 72 | Resp 12 | Wt 244.4 lb

## 2011-07-30 DIAGNOSIS — F332 Major depressive disorder, recurrent severe without psychotic features: Secondary | ICD-10-CM

## 2011-07-30 DIAGNOSIS — F339 Major depressive disorder, recurrent, unspecified: Secondary | ICD-10-CM

## 2011-07-30 MED ORDER — CLONAZEPAM 0.5 MG PO TABS: 0.5000 mg | ORAL_TABLET | Freq: Two times a day (BID) | ORAL | Status: DC

## 2011-07-30 MED ORDER — DULOXETINE HCL 30 MG PO CPEP: 60.0000 mg | ORAL_CAPSULE | Freq: Every morning | ORAL | Status: DC

## 2011-07-30 NOTE — Telephone Encounter (Signed)
Thank you.  We will not be providing pain medication.

## 2011-07-30 NOTE — Telephone Encounter (Signed)
[name] called to let us know that [name] is looking for more pain med. Dr Theron Arista has refused to give him anymore, and she believes he is going through them too quickly. She wants [place] to be aware. She is letting all providers know what is going on with this patient.
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
## 2011-08-02 ENCOUNTER — Other Ambulatory Visit (HOSPITAL_COMMUNITY): Payer: Self-pay | Admitting: *Deleted

## 2011-08-02 DIAGNOSIS — F339 Major depressive disorder, recurrent, unspecified: Secondary | ICD-10-CM

## 2011-08-02 MED ORDER — DULOXETINE HCL 30 MG PO CPEP: 60.0000 mg | ORAL_CAPSULE | Freq: Every morning | ORAL | Status: DC

## 2011-08-02 NOTE — Telephone Encounter (Signed)
Medication was printed erroneously 07/30/11.Should have been sent thru surescript to pharmacy. Sent on 08/02/11

## 2011-08-03 ENCOUNTER — Other Ambulatory Visit (HOSPITAL_COMMUNITY): Payer: Self-pay | Admitting: *Deleted

## 2011-08-03 DIAGNOSIS — F339 Major depressive disorder, recurrent, unspecified: Secondary | ICD-10-CM

## 2011-08-03 MED ORDER — DULOXETINE HCL 30 MG PO CPEP: 60.0000 mg | ORAL_CAPSULE | Freq: Every morning | ORAL | Status: DC

## 2011-08-09 ENCOUNTER — Ambulatory Visit (HOSPITAL_COMMUNITY): Admission: RE | Admit: 2011-08-09 | Payer: Medicare Other | Source: Ambulatory Visit

## 2011-08-10 ENCOUNTER — Emergency Department (HOSPITAL_COMMUNITY)
Admission: EM | Admit: 2011-08-10 | Discharge: 2011-08-10 | Disposition: A | Discharge status: Home / Self Care | Payer: Medicare Other | Attending: Emergency Medicine | Admitting: Emergency Medicine

## 2011-08-10 ENCOUNTER — Emergency Department (HOSPITAL_COMMUNITY): Payer: Medicare Other

## 2011-08-10 ENCOUNTER — Encounter (HOSPITAL_COMMUNITY): Payer: Self-pay | Admitting: Radiology

## 2011-08-10 DIAGNOSIS — M79606 Pain in leg, unspecified: Secondary | ICD-10-CM

## 2011-08-10 DIAGNOSIS — E119 Type 2 diabetes mellitus without complications: Secondary | ICD-10-CM | POA: Insufficient documentation

## 2011-08-10 DIAGNOSIS — M79609 Pain in unspecified limb: Secondary | ICD-10-CM | POA: Insufficient documentation

## 2011-08-10 DIAGNOSIS — I509 Heart failure, unspecified: Secondary | ICD-10-CM | POA: Insufficient documentation

## 2011-08-10 DIAGNOSIS — I428 Other cardiomyopathies: Secondary | ICD-10-CM | POA: Insufficient documentation

## 2011-08-10 DIAGNOSIS — I10 Essential (primary) hypertension: Secondary | ICD-10-CM | POA: Insufficient documentation

## 2011-08-10 DIAGNOSIS — R079 Chest pain, unspecified: Secondary | ICD-10-CM

## 2011-08-10 DIAGNOSIS — F329 Major depressive disorder, single episode, unspecified: Secondary | ICD-10-CM | POA: Insufficient documentation

## 2011-08-10 DIAGNOSIS — Z794 Long term (current) use of insulin: Secondary | ICD-10-CM | POA: Insufficient documentation

## 2011-08-10 DIAGNOSIS — F3289 Other specified depressive episodes: Secondary | ICD-10-CM | POA: Insufficient documentation

## 2011-08-10 DIAGNOSIS — K219 Gastro-esophageal reflux disease without esophagitis: Secondary | ICD-10-CM | POA: Insufficient documentation

## 2011-08-10 LAB — CBC WITH DIFFERENTIAL/PLATELET
Basophils Relative: 0 % (ref 0–1)
Eosinophils Relative: 2 % (ref 0–5)
HCT: 44.9 % (ref 39.0–52.0)
Hemoglobin: 15.3 g/dL (ref 13.0–17.0)
MCHC: 34.1 g/dL (ref 30.0–36.0)
MCV: 88.7 fL (ref 78.0–100.0)
Monocytes Absolute: 0.4 10*3/uL (ref 0.1–1.0)
Monocytes Relative: 6 % (ref 3–12)
Neutro Abs: 4.5 10*3/uL (ref 1.7–7.7)

## 2011-08-10 LAB — POCT I-STAT, CHEM 8
BUN: 19 mg/dL (ref 6–23)
Calcium, Ion: 1.22 mmol/L (ref 1.12–1.23)
Chloride: 102 mEq/L (ref 96–112)
Glucose, Bld: 236 mg/dL — ABNORMAL HIGH (ref 70–99)

## 2011-08-10 LAB — CARDIAC PANEL(CRET KIN+CKTOT+MB+TROPI): Relative Index: 3 — ABNORMAL HIGH (ref 0.0–2.5)

## 2011-08-10 MED ORDER — OXYCODONE-ACETAMINOPHEN 5-325 MG PO TABS: 2.0000 | ORAL_TABLET | ORAL | Status: AC | PRN

## 2011-08-10 MED ORDER — SODIUM CHLORIDE 0.9 % IV SOLN
INTRAVENOUS | Status: DC
  Administered 2011-08-10: 14:00:00 via INTRAVENOUS

## 2011-08-10 MED ORDER — ONDANSETRON HCL 4 MG/2ML IJ SOLN
4.0000 mg | Freq: Once | INTRAMUSCULAR | Status: AC
  Administered 2011-08-10: 4 mg via INTRAVENOUS
  Filled 2011-08-10: qty 2

## 2011-08-10 MED ORDER — HYDROMORPHONE HCL PF 1 MG/ML IJ SOLN
1.0000 mg | Freq: Once | INTRAMUSCULAR | Status: AC
  Administered 2011-08-10: 1 mg via INTRAVENOUS
  Filled 2011-08-10: qty 1

## 2011-08-10 NOTE — ED Notes (Signed)
[facility] called to transport patient at this time.

## 2011-08-10 NOTE — ED Notes (Signed)
MD at bedside. 

## 2011-08-10 NOTE — Progress Notes (Signed)
Room [facility] ED D-33  - Glendon Axe -  [facility] RN Visit  NON-Related visit.  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   Pt is FULL code.    Pt alert & oriented, lying in bed, with complaints of L/hip pain radiating down LLE at 10/10. Pt has been medicated and pt states nothing has helped.    No family present.    Pt states pain started on Sunday following having to move unexpectedly from their home (owned by wife's grandfather) on Friday.  Pt admits to moving and "breaking down" all the 02, nebulizer equipment, etc and moving it to new living quarters (?Relaxed [place]).  Pt sweating profusely across the forehead with puddles of sweat accumulating across clavicle area-brought to attention of staff RN who offered pt wash cloth, towel and will advise physician.   Please call [facility] @ [phone] with any hospice needs.  Thank you.  Joneen Boers, RN  [facility]  Hospice Liaison

## 2011-08-10 NOTE — ED Provider Notes (Signed)
History     CSN: [id number]  Arrival date & time 08/10/11  1240   First MD Initiated Contact with Patient 08/10/11 1240      Chief Complaint  Patient presents with  . Back Pain    (Consider location/radiation/quality/duration/timing/severity/associated sxs/prior treatment) The history is provided by the patient.  pt c/o cp for 4 d.  He was moving furniture 3 d ago. Then 2 d ago he woke up with left leg pain.  No hx of trauma. Says pain is  So bad he can hardly walk.  No cough, sob, n/v/f/c/sweating.  Says cp feels like pressure. Has hx of chf and defibrillator.   C/o pedal edema.    Past Medical History  Diagnosis Date  . Hypertension   . 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 History of peptic ulcer disease     EGD 09/2007 shows prox gastric ulcer with black eschar, treated with PPI  . Depression   . History of alcohol abuse     quit approx 2011-2012  . Angina   . Shortness of breath   . OSA (obstructive sleep apnea)     sleeps with oxygen  . Cardiomyopathy   . ICD (implantable cardiac defibrillator) in place   . Diabetes mellitus     insulin dependent  . GERD (gastroesophageal reflux disease)     Past Surgical History  Procedure Date  . Cardiac defibrillator placement 04/2007  . Knee surgery   . Skin grafts   . Facial reconstruction surgery     Family History  Problem Relation Age of Onset  . Heart failure Mother   . Hypertension Mother   . Sarcoidosis Mother   . Diabetes type II Mother   . Heart attack Brother     History  Substance Use Topics  . Smoking status: Never Smoker   . Smokeless tobacco: Never Used  . Alcohol Use: No     no alcohol since 2012      Review of Systems  Constitutional: Negative for fever, chills and diaphoresis.  HENT: Negative for neck pain.   Eyes: Negative for redness.  Respiratory: Positive for choking. Negative for cough  and shortness of breath.   Cardiovascular: Positive for chest pain. Negative for palpitations and leg swelling.  Gastrointestinal: Negative for nausea and vomiting.  Musculoskeletal: Negative for back pain.  Skin: Negative for rash.  Neurological: Negative for headaches.  Psychiatric/Behavioral: Negative for confusion.  All other systems reviewed and are negative.    Allergies  Shellfish allergy; Adhesive; and Zoloft  Home Medications   Current Outpatient Rx  Name Route Sig Dispense Refill  . AMITRIPTYLINE HCL 25 MG PO TABS  TAKE 1 TABLET BY MOUTH AT BEDTIME 31 tablet 0  . BENAZEPRIL HCL 40 MG PO TABS Oral Take 0.5 tablets (20 mg total) by mouth daily. 30 tablet 2  . CARVEDILOL 3.125 MG PO TABS Oral Take 2 tablets (6.25 mg total) by mouth 2 (two) times daily with a meal. 60 tablet 6  . CLONAZEPAM 0.5 MG PO TABS Oral Take 1 tablet (0.5 mg total) by mouth 2 (two) times daily. 60 tablet 3  . CYCLOBENZAPRINE HCL 10 MG PO TABS Oral Take 1 tablet (10 mg total) by mouth every 8 (eight) hours as needed for muscle spasms. 30 tablet 1  . DIGOXIN 0.25 MG PO TABS Oral Take 1 tablet (0.25 mg total)  by mouth daily. 30 tablet 6  . DULOXETINE HCL 30 MG PO CPEP Oral Take 2 capsules (60 mg total) by mouth every morning. Take one pill (30mg ) daily for one week. Then take two pills (total of 60 mg) together once daily. 60 capsule 2  . FUROSEMIDE 80 MG PO TABS Oral Take 40 mg by mouth daily.     Marland Kitchen GABAPENTIN 300 MG PO CAPS Oral Take 3 capsules (900 mg total) by mouth 3 (three) times daily. 270 capsule 3  . GLIPIZIDE 10 MG PO TABS Oral Take 1 tablet (10 mg total) by mouth daily. 30 tablet 3  . HYDRALAZINE HCL 10 MG PO TABS Oral Take 10 mg by mouth 3 (three) times daily.    Marland Kitchen HYDROCODONE-ACETAMINOPHEN 5-325 MG PO TABS Oral Take 2 tablets by mouth every 6 (six) hours as needed. For pain    . INSULIN GLARGINE 100 UNIT/ML [place] SOLN Subcutaneous Inject 15 Units into the skin at bedtime. 10 mL 3  . OMEPRAZOLE 20 MG  PO CPDR Oral Take 20 mg by mouth daily.    Marland Kitchen ROSUVASTATIN CALCIUM 10 MG PO TABS Oral Take 1 tablet (10 mg total) by mouth daily. 30 tablet 3  . SUMATRIPTAN SUCCINATE 50 MG PO TABS Oral Take 50 mg by mouth every 2 (two) hours as needed. Take 1 tablet at onset of migraine, may take 1 tablet 2 hours later if needed    . ZOLPIDEM TARTRATE 5 MG PO TABS Oral Take 1 tablet (5 mg total) by mouth at bedtime as needed. For insomnia 30 tablet 1  . INSULIN SYRINGES (DISPOSABLE) U-100 0.3 ML MISC Does not apply 15 Units by Does not apply route at bedtime. 1 each 3  . TADALAFIL 5 MG PO TABS Oral Take 1 tablet (5 mg total) by mouth daily as needed for erectile dysfunction. 30 tablet 0    BP 133/96  Pulse 96  Temp 97.7 F (36.5 C) (Oral)  SpO2 100%  Physical Exam  Nursing note and vitals reviewed. Constitutional: He is oriented to person, place, and time. He appears well-developed and well-nourished. No distress.  HENT:  Head: Normocephalic and atraumatic.  Eyes: Conjunctivae are normal.  Neck: Normal range of motion. Neck supple. No JVD present.       No bruits  Cardiovascular: Normal rate.   No murmur heard. Pulmonary/Chest: Effort normal and breath sounds normal. He has no wheezes. He has no rales.  Abdominal: Soft. He exhibits no distension. There is no tenderness.  Musculoskeletal: Normal range of motion. He exhibits no edema and no tenderness.       Movement of left leg eg flexion at hip and knee causes "pain"   I am skeptical about any true sxs.    Neurological: He is alert and oriented to person, place, and time. No cranial nerve deficit.  Skin: Skin is warm and dry.  Psychiatric: He has a normal mood and affect. Thought content normal.    ED Course  Procedures (including critical care time)   Labs Reviewed  CARDIAC PANEL(CRET KIN+CKTOT+MB+TROPI)  CBC WITH DIFFERENTIAL   No results found.   No diagnosis found.    MDM  Nontraumatic left leg pain after moving furniture Chest  pain with no other sxs of cardiac or pulm dz.- sxs for several days.  Ces neg. Not c/w acs.  No pulm dz on cxr        Cheri Guppy, MD 08/10/11 1451

## 2011-08-10 NOTE — ED Notes (Addendum)
Pt presents with back pain radiating down left leg X 2 days. Pt reports an increase in intensity of pain. Pt denies any injury.Pt is being treated for muscle tension and seeing a PT  2 times a week. Pt is currently out of flexeril. Pt is in hospice r/t cardiomypothy.

## 2011-08-17 ENCOUNTER — Ambulatory Visit: Payer: Medicare Other | Admitting: Physical Therapy

## 2011-08-19 ENCOUNTER — Telehealth: Payer: Self-pay | Admitting: *Deleted

## 2011-08-19 ENCOUNTER — Ambulatory Visit: Payer: Medicare Other | Attending: Internal Medicine

## 2011-08-19 DIAGNOSIS — M545 Low back pain, unspecified: Secondary | ICD-10-CM | POA: Insufficient documentation

## 2011-08-19 DIAGNOSIS — M2560 Stiffness of unspecified joint, not elsewhere classified: Secondary | ICD-10-CM | POA: Insufficient documentation

## 2011-08-19 DIAGNOSIS — IMO0001 Reserved for inherently not codable concepts without codable children: Secondary | ICD-10-CM | POA: Insufficient documentation

## 2011-08-19 DIAGNOSIS — R5381 Other malaise: Secondary | ICD-10-CM | POA: Insufficient documentation

## 2011-08-19 DIAGNOSIS — M542 Cervicalgia: Secondary | ICD-10-CM | POA: Insufficient documentation

## 2011-08-19 NOTE — Telephone Encounter (Signed)
phy therp, laurie beardsley, [facility]- [address], calls to say pt is having anxiety due to personal stressors- homless, underage children to care for, wife as he describes is addicted to medication. She states he seems fearful of activity and she has encouraged him to continue to try to improve the quality of his life for his children. She is hopeful that the [facility] can assist him in continuing the improvement. i have informed her that i will send this to the social worker and have her make contact w/ pt. Pt ph# [phone]

## 2011-08-26 ENCOUNTER — Telehealth: Payer: Self-pay | Admitting: Licensed Clinical Social Worker

## 2011-08-26 ENCOUNTER — Encounter: Payer: Self-pay | Admitting: Physical Therapy

## 2011-08-26 NOTE — Telephone Encounter (Signed)
RN received concern from pt's physical therapist regarding home environment.  CSW attempted to follow up with [name], called number PT provider, pt no longer residing there and was informed pt and spouse are out of minutes of their cell phones.  CSW placed call to number on chart, unable to leave message.

## 2011-09-03 ENCOUNTER — Inpatient Hospital Stay (HOSPITAL_COMMUNITY)
Admission: AD | Admit: 2011-09-03 | Discharge: 2011-09-09 | DRG: 291 | Disposition: A | Discharge status: Hospice | Source: Ambulatory Visit | Attending: Internal Medicine | Admitting: Internal Medicine

## 2011-09-03 ENCOUNTER — Encounter (HOSPITAL_COMMUNITY): Payer: Self-pay | Admitting: *Deleted

## 2011-09-03 ENCOUNTER — Ambulatory Visit (HOSPITAL_BASED_OUTPATIENT_CLINIC_OR_DEPARTMENT_OTHER)
Admission: RE | Admit: 2011-09-03 | Discharge: 2011-09-03 | Disposition: A | Discharge status: Home / Self Care | Source: Ambulatory Visit | Attending: Internal Medicine | Admitting: Internal Medicine

## 2011-09-03 ENCOUNTER — Ambulatory Visit (HOSPITAL_COMMUNITY): Payer: Medicare Other | Admitting: Psychiatry

## 2011-09-03 ENCOUNTER — Other Ambulatory Visit (HOSPITAL_COMMUNITY): Payer: Self-pay | Admitting: Anesthesiology

## 2011-09-03 VITALS — BP 122/82 | HR 110 | Wt 248.0 lb

## 2011-09-03 DIAGNOSIS — I426 Alcoholic cardiomyopathy: Secondary | ICD-10-CM | POA: Diagnosis present

## 2011-09-03 DIAGNOSIS — I5023 Acute on chronic systolic (congestive) heart failure: Secondary | ICD-10-CM | POA: Diagnosis present

## 2011-09-03 DIAGNOSIS — Z79899 Other long term (current) drug therapy: Secondary | ICD-10-CM

## 2011-09-03 DIAGNOSIS — Z95810 Presence of automatic (implantable) cardiac defibrillator: Secondary | ICD-10-CM

## 2011-09-03 DIAGNOSIS — Z5900 Homelessness unspecified: Secondary | ICD-10-CM

## 2011-09-03 DIAGNOSIS — I509 Heart failure, unspecified: Secondary | ICD-10-CM | POA: Diagnosis present

## 2011-09-03 DIAGNOSIS — E119 Type 2 diabetes mellitus without complications: Secondary | ICD-10-CM | POA: Diagnosis present

## 2011-09-03 DIAGNOSIS — Z9119 Patient's noncompliance with other medical treatment and regimen: Secondary | ICD-10-CM

## 2011-09-03 DIAGNOSIS — F329 Major depressive disorder, single episode, unspecified: Secondary | ICD-10-CM | POA: Diagnosis present

## 2011-09-03 DIAGNOSIS — N179 Acute kidney failure, unspecified: Secondary | ICD-10-CM | POA: Diagnosis not present

## 2011-09-03 DIAGNOSIS — R0989 Other specified symptoms and signs involving the circulatory and respiratory systems: Secondary | ICD-10-CM

## 2011-09-03 DIAGNOSIS — Z91199 Patient's noncompliance with other medical treatment and regimen due to unspecified reason: Secondary | ICD-10-CM

## 2011-09-03 DIAGNOSIS — F3289 Other specified depressive episodes: Secondary | ICD-10-CM | POA: Diagnosis present

## 2011-09-03 DIAGNOSIS — K219 Gastro-esophageal reflux disease without esophagitis: Secondary | ICD-10-CM | POA: Diagnosis present

## 2011-09-03 DIAGNOSIS — E875 Hyperkalemia: Secondary | ICD-10-CM | POA: Diagnosis not present

## 2011-09-03 DIAGNOSIS — N189 Chronic kidney disease, unspecified: Secondary | ICD-10-CM | POA: Diagnosis present

## 2011-09-03 DIAGNOSIS — F1011 Alcohol abuse, in remission: Secondary | ICD-10-CM | POA: Diagnosis present

## 2011-09-03 DIAGNOSIS — I130 Hypertensive heart and chronic kidney disease with heart failure and stage 1 through stage 4 chronic kidney disease, or unspecified chronic kidney disease: Principal | ICD-10-CM | POA: Diagnosis present

## 2011-09-03 DIAGNOSIS — Z794 Long term (current) use of insulin: Secondary | ICD-10-CM

## 2011-09-03 DIAGNOSIS — G4733 Obstructive sleep apnea (adult) (pediatric): Secondary | ICD-10-CM | POA: Diagnosis present

## 2011-09-03 LAB — CBC WITH DIFFERENTIAL/PLATELET
Basophils Absolute: 0 10*3/uL (ref 0.0–0.1)
HCT: 42.8 % (ref 39.0–52.0)
Lymphocytes Relative: 35 % (ref 12–46)
Monocytes Absolute: 0.3 10*3/uL (ref 0.1–1.0)
Neutro Abs: 2.9 10*3/uL (ref 1.7–7.7)
RDW: 13.9 % (ref 11.5–15.5)
WBC: 5.3 10*3/uL (ref 4.0–10.5)

## 2011-09-03 LAB — BASIC METABOLIC PANEL
CO2: 25 mEq/L (ref 19–32)
Calcium: 9.2 mg/dL (ref 8.4–10.5)
Chloride: 101 mEq/L (ref 96–112)
Sodium: 134 mEq/L — ABNORMAL LOW (ref 135–145)

## 2011-09-03 LAB — DIGOXIN LEVEL: Digoxin Level: 0.4 ng/mL — ABNORMAL LOW (ref 0.8–2.0)

## 2011-09-03 LAB — MRSA PCR SCREENING: MRSA by PCR: NEGATIVE

## 2011-09-03 LAB — PRO B NATRIURETIC PEPTIDE: Pro B Natriuretic peptide (BNP): 3290 pg/mL — ABNORMAL HIGH (ref 0–125)

## 2011-09-03 MED ORDER — PANTOPRAZOLE SODIUM 20 MG PO TBEC: 40.0000 mg | DELAYED_RELEASE_TABLET | Freq: Every day | ORAL | Status: DC

## 2011-09-03 MED ORDER — DIGOXIN 250 MCG PO TABS
0.2500 mg | ORAL_TABLET | Freq: Every day | ORAL | Status: DC
  Administered 2011-09-03 – 2011-09-09 (×7): 0.25 mg via ORAL
  Filled 2011-09-03 (×7): qty 1

## 2011-09-03 MED ORDER — SODIUM CHLORIDE 0.9 % IJ SOLN: 3.0000 mL | Freq: Two times a day (BID) | INTRAMUSCULAR | Status: DC

## 2011-09-03 MED ORDER — POTASSIUM CHLORIDE CRYS ER 20 MEQ PO TBCR
40.0000 meq | EXTENDED_RELEASE_TABLET | Freq: Two times a day (BID) | ORAL | Status: DC
  Administered 2011-09-03 – 2011-09-06 (×7): 40 meq via ORAL
  Filled 2011-09-03 (×9): qty 2

## 2011-09-03 MED ORDER — PANTOPRAZOLE SODIUM 40 MG PO TBEC
40.0000 mg | DELAYED_RELEASE_TABLET | Freq: Every day | ORAL | Status: DC
  Administered 2011-09-03 – 2011-09-09 (×7): 40 mg via ORAL
  Filled 2011-09-03 (×7): qty 1

## 2011-09-03 MED ORDER — ATORVASTATIN CALCIUM 10 MG PO TABS: 10.0000 mg | ORAL_TABLET | Freq: Every day | ORAL | Status: DC

## 2011-09-03 MED ORDER — ATORVASTATIN CALCIUM 10 MG PO TABS
10.0000 mg | ORAL_TABLET | Freq: Every day | ORAL | Status: DC
  Administered 2011-09-03 – 2011-09-09 (×6): 10 mg via ORAL
  Filled 2011-09-03 (×9): qty 1

## 2011-09-03 MED ORDER — BENAZEPRIL HCL 40 MG PO TABS
40.0000 mg | ORAL_TABLET | Freq: Every day | ORAL | Status: DC
  Administered 2011-09-03 – 2011-09-08 (×6): 40 mg via ORAL
  Filled 2011-09-03 (×7): qty 1

## 2011-09-03 MED ORDER — AMITRIPTYLINE HCL 10 MG PO TABS: 25.0000 mg | ORAL_TABLET | Freq: Every day | ORAL | Status: DC

## 2011-09-03 MED ORDER — AMITRIPTYLINE HCL 25 MG PO TABS
25.0000 mg | ORAL_TABLET | Freq: Every day | ORAL | Status: DC
  Administered 2011-09-03 – 2011-09-08 (×6): 25 mg via ORAL
  Filled 2011-09-03 (×8): qty 1

## 2011-09-03 MED ORDER — SODIUM CHLORIDE 0.9 % IJ SOLN
3.0000 mL | Freq: Two times a day (BID) | INTRAMUSCULAR | Status: DC
  Administered 2011-09-03 – 2011-09-09 (×8): 3 mL via INTRAVENOUS

## 2011-09-03 MED ORDER — GLIPIZIDE 2.5 MG HALF TABLET: 10.0000 mg | ORAL_TABLET | Freq: Every day | ORAL | Status: DC

## 2011-09-03 MED ORDER — ACETAMINOPHEN 325 MG PO TABS
650.0000 mg | ORAL_TABLET | ORAL | Status: DC | PRN
  Filled 2011-09-03: qty 2

## 2011-09-03 MED ORDER — INSULIN GLARGINE 100 UNIT/ML ~~LOC~~ SOLN
15.0000 [IU] | Freq: Every day | SUBCUTANEOUS | Status: DC
  Administered 2011-09-03 – 2011-09-08 (×6): 15 [IU] via SUBCUTANEOUS

## 2011-09-03 MED ORDER — SODIUM CHLORIDE 0.9 % IV SOLN: 250.0000 mL | INTRAVENOUS | Status: DC | PRN

## 2011-09-03 MED ORDER — ENOXAPARIN SODIUM 150 MG/ML ~~LOC~~ SOLN: 40.0000 mg | SUBCUTANEOUS | Status: DC

## 2011-09-03 MED ORDER — ONDANSETRON HCL 4 MG/2ML IJ SOLN: 4.0000 mg | Freq: Four times a day (QID) | INTRAMUSCULAR | Status: DC | PRN

## 2011-09-03 MED ORDER — HYDRALAZINE HCL 10 MG PO TABS
10.0000 mg | ORAL_TABLET | Freq: Three times a day (TID) | ORAL | Status: DC
  Administered 2011-09-03 – 2011-09-09 (×18): 10 mg via ORAL
  Filled 2011-09-03 (×21): qty 1

## 2011-09-03 MED ORDER — SODIUM CHLORIDE 0.9 % IV SOLN: 4.0000 mg | Freq: Four times a day (QID) | INTRAVENOUS | Status: DC | PRN

## 2011-09-03 MED ORDER — FUROSEMIDE 10 MG/ML IJ SOLN
80.0000 mg | Freq: Three times a day (TID) | INTRAMUSCULAR | Status: DC
  Administered 2011-09-03 – 2011-09-06 (×11): 80 mg via INTRAVENOUS
  Filled 2011-09-03 (×13): qty 8

## 2011-09-03 MED ORDER — TRAMADOL HCL 50 MG PO TABS
50.0000 mg | ORAL_TABLET | Freq: Four times a day (QID) | ORAL | Status: DC | PRN
  Administered 2011-09-03: 50 mg via ORAL
  Filled 2011-09-03 (×2): qty 1

## 2011-09-03 MED ORDER — POTASSIUM CHLORIDE CRYS ER 10 MEQ PO TBCR: 40.0000 meq | EXTENDED_RELEASE_TABLET | Freq: Two times a day (BID) | ORAL | Status: DC

## 2011-09-03 MED ORDER — ENOXAPARIN SODIUM 40 MG/0.4ML ~~LOC~~ SOLN
40.0000 mg | SUBCUTANEOUS | Status: DC
  Administered 2011-09-03 – 2011-09-08 (×6): 40 mg via SUBCUTANEOUS
  Filled 2011-09-03 (×7): qty 0.4

## 2011-09-03 MED ORDER — ACETAMINOPHEN 325 MG PO TABS: 650.0000 mg | ORAL_TABLET | ORAL | Status: DC | PRN

## 2011-09-03 MED ORDER — GABAPENTIN 100 MG PO CAPS: 300.0000 mg | ORAL_CAPSULE | Freq: Three times a day (TID) | ORAL | Status: DC

## 2011-09-03 MED ORDER — INSULIN ASPART 100 UNIT/ML ~~LOC~~ SOLN
0.0000 [IU] | Freq: Three times a day (TID) | SUBCUTANEOUS | Status: DC
  Administered 2011-09-07: 3 [IU] via SUBCUTANEOUS
  Administered 2011-09-07: 2 [IU] via SUBCUTANEOUS
  Administered 2011-09-08: 3 [IU] via SUBCUTANEOUS
  Administered 2011-09-08 – 2011-09-09 (×2): 2 [IU] via SUBCUTANEOUS
  Administered 2011-09-09: 12:00:00 via SUBCUTANEOUS

## 2011-09-03 MED ORDER — GABAPENTIN 300 MG PO CAPS
300.0000 mg | ORAL_CAPSULE | Freq: Three times a day (TID) | ORAL | Status: DC
  Administered 2011-09-03 – 2011-09-09 (×18): 300 mg via ORAL
  Filled 2011-09-03 (×20): qty 1

## 2011-09-03 MED ORDER — GLIPIZIDE ER 2.5 MG PO TB24: 10.0000 mg | ORAL_TABLET | Freq: Every day | ORAL | Status: DC

## 2011-09-03 MED ORDER — GLIPIZIDE 10 MG PO TABS
10.0000 mg | ORAL_TABLET | Freq: Every day | ORAL | Status: DC
  Administered 2011-09-04 – 2011-09-09 (×5): 10 mg via ORAL
  Filled 2011-09-03 (×8): qty 1

## 2011-09-03 MED ORDER — INSULIN GLARGINE 100 UNIT/ML ~~LOC~~ SOLN: 15.0000 [IU] | Freq: Every day | SUBCUTANEOUS | Status: DC

## 2011-09-03 MED ORDER — SODIUM CHLORIDE 0.9 % IJ SOLN: 3.0000 mL | INTRAMUSCULAR | Status: DC | PRN

## 2011-09-03 MED ORDER — BENAZEPRIL HCL 5 MG PO TABS: 40.0000 mg | ORAL_TABLET | Freq: Every day | ORAL | Status: DC

## 2011-09-03 MED ORDER — SODIUM CHLORIDE 0.9 % IJ SOLN
3.0000 mL | INTRAMUSCULAR | Status: DC | PRN
  Administered 2011-09-07: 3 mL via INTRAVENOUS

## 2011-09-03 MED ORDER — DIGOXIN 0.0625 MG HALF TABLET: 0.2500 mg | ORAL_TABLET | Freq: Every day | ORAL | Status: DC

## 2011-09-03 MED ORDER — FUROSEMIDE 10 MG/ML IJ SOLN: 80.0000 mg | Freq: Three times a day (TID) | INTRAMUSCULAR | Status: DC

## 2011-09-03 MED ORDER — INSULIN ASPART 100 UNIT/ML ~~LOC~~ SOLN: 0.0000 [IU] | Freq: Every day | SUBCUTANEOUS | Status: DC

## 2011-09-03 MED ORDER — HYDRALAZINE HCL 10 MG PO TABS: 10.0000 mg | ORAL_TABLET | Freq: Three times a day (TID) | ORAL | Status: DC

## 2011-09-03 NOTE — Progress Notes (Signed)
Clinical Social Worker reviewed chart and spoke with patient regarding home situation. Patient stated that he is receiving disability and has housing. Patient reported that he no longer lives with his wife's grandfather, due to a falling out. Patient reports that his main concern is food. Patient reports that he applied for food stamps, but it will be 30 days before that will be active. Patient reports that his wife's grandfather sent back the re-certification and is now penalized with a 30 day wait. Patient reports that he uses food banks and goes to social services for food vouchers. Patient reports that his wife is suppose to take his children her grandfather's house to eat and that he gave her his last bit of money to spend on food for his children. Patient reports that his aunt is suppose to check on the children tomorrow. Patient appeared genuinely concerned about his children and making sure they will have food to eat. CSW provided emotional support and will continue to follow for additional assistance PRN.   Rozetta Nunnery MSW, [employer] [phone]

## 2011-09-03 NOTE — Progress Notes (Signed)
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 Cath in 2008 showed left circumflex with 40% stenosis.   He has had multiple admissions for HF over the past year.   Admitted 03/2011 with NYHA Class IV symptoms, requiring milrinone therapy and IV lasix. Cath 04/13/11: cardiogenic shock with biventricular failure (R>L) and possible restrictive physiology. RA = 22 with steep y-descents, RV = 45/12/25, PA = 51/25 (34), PCW = 18-20, Fick cardiac output/index = 3.1/1.4, Thermo CO/CI = 3.6/1.6, PVR = 4.8 Woods O2 sat = 85% (after sedation), PA sat = 31%, 36%. Milrinone was initiated for low output physiology. [name] was left in RIJ for hemodynamic monitoring and he was transferred to the CCU. Echo was done to re-evaluate RV function this showed LVEF 15%. RV moderately dilated with mildly reduced systolic function. RA and LA mildly dilated. PAPP 52 mmHG.LE dopplers ordered (negative for DVT). Digoxin, Ace-I and beta blocker started. Diuresed 33 liters. Discharge weight 214 pounds.   [name] stopped due to hyperkalemia.   D/C from [facility] 06/13/11 due to pneumonia.   He returns for follow up today.  He feels terrible.  Weight up ~13 pounds.  He is short of breath walking 2-3 steps.  +orthopnea/PND.  +abdominal distention.  +lower extremity edema.  He has been under increased stress because he lost his living situation. Not responding to increased oral lasix.   [facility] following.      ROS: All systems negative except as listed in HPI, PMH and Problem List.  Past Medical History  Diagnosis Date  . Hypertension   . 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 History of peptic ulcer disease     EGD 09/2007 shows prox gastric ulcer with black eschar, treated with PPI  . Depression   . History of alcohol abuse     quit approx 2011-2012  . Angina   .  Shortness of breath   . OSA (obstructive sleep apnea)     sleeps with oxygen  . Cardiomyopathy   . ICD (implantable cardiac defibrillator) in place   . Diabetes mellitus     insulin dependent  . GERD (gastroesophageal reflux disease)     Current Outpatient Prescriptions  Medication Sig Dispense Refill  . amitriptyline (ELAVIL) 25 MG tablet TAKE 1 TABLET BY MOUTH AT BEDTIME  31 tablet  0  . benazepril (LOTENSIN) 40 MG tablet Take 40 mg by mouth daily.      . carvedilol (COREG) 3.125 MG tablet 2 tab in AM and 1 tab in PM      . digoxin (LANOXIN) 0.25 MG tablet Take 1 tablet (0.25 mg total) by mouth daily.  30 tablet  6  . furosemide (LASIX) 80 MG tablet Take 160 mg by mouth 2 (two) times daily.       Marland Kitchen gabapentin (NEURONTIN) 300 MG capsule Take 3 capsules (900 mg total) by mouth 3 (three) times daily.  270 capsule  3  . glipiZIDE (GLUCOTROL) 10 MG tablet Take 1 tablet (10 mg total) by mouth daily.  30 tablet  3  . hydrALAZINE (APRESOLINE) 10 MG tablet Take 10 mg by mouth 3 (three) times daily.      . insulin glargine (LANTUS) 100 UNIT/ML injection Inject 15 Units into the skin at bedtime.  10 mL  3  .  Insulin Syringes, Disposable, U-100 0.3 ML MISC 15 Units by Does not apply route at bedtime.  1 each  3  . omeprazole (PRILOSEC) 20 MG capsule Take 20 mg by mouth daily.      Marland Kitchen oxyCODONE-acetaminophen (PERCOCET/ROXICET) 5-325 MG per tablet Take 1 tablet by mouth every 4 (four) hours as needed.      . rosuvastatin (CRESTOR) 10 MG tablet Take 1 tablet (10 mg total) by mouth daily.  30 tablet  3  . zolpidem (AMBIEN) 5 MG tablet Take 1 tablet (5 mg total) by mouth at bedtime as needed. For insomnia  30 tablet  1  . DISCONTD: benazepril (LOTENSIN) 40 MG tablet Take 0.5 tablets (20 mg total) by mouth daily.  30 tablet  2  . DISCONTD: carvedilol (COREG) 3.125 MG tablet Take 2 tablets (6.25 mg total) by mouth 2 (two) times daily with a meal.  60 tablet  6  . clonazePAM (KLONOPIN) 0.5 MG tablet Take 1  tablet (0.5 mg total) by mouth 2 (two) times daily.  60 tablet  3  . tadalafil (CIALIS) 5 MG tablet Take 1 tablet (5 mg total) by mouth daily as needed for erectile dysfunction.  30 tablet  0  . DISCONTD: metoprolol tartrate (LOPRESSOR) 25 MG tablet Take 1 tablet (25 mg total) by mouth 2 (two) times daily.  60 tablet  1  . DISCONTD: traZODone (DESYREL) 25 mg TABS Take 0.5 tablets (25 mg total) by mouth at bedtime.  30 tablet  1     PHYSICAL EXAM: Filed Vitals:   09/03/11 0843  BP: 122/82  Pulse: 110  Weight: 248 lb (112.492 kg)  SpO2: 100%    General:  Stressed out. Chronically appearing. No resp difficulty HEENT: normal Neck: supple. JVP jaw. Carotids 2+ bilaterally; no bruits. No lymphadenopathy or thryomegaly appreciated. Cor: PMI normal. Regular rate & rhythm. +s3 2/6 MR Lungs: clear Abdomen: soft, nontender, +distended. No hepatosplenomegaly. No bruits or masses. Good bowel sounds. Extremities: no cyanosis, clubbing, rash, 3+ edema Neuro: alert & orientedx3, cranial nerves grossly intact. Moves all 4 extremities w/o difficulty. Affect pleasant.     ASSESSMENT & PLAN:
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
## 2011-09-03 NOTE — Progress Notes (Addendum)
Room 2922 - [name] -   [facility] RN Visit  Related admission to [facility] diagnosis of /alcoholic cardiomyopathy.   Pt is DNR code.    Pt alert & oriented, lying upright in bed, with complaints of pain in his hip and low back at 10/10.  No family present.  Advised pt the [facility] RN's would be following him daily during his hospital stay.  Discussed patient with primary RN Nehemiah Settle.  Dr. Gala Romney has ordered Tramadol for patient's pain - pt wants Percocet - expect pt will be upset at pain regimen.  [facility] medication list from home on shadow chart.   Encouraged pt to follow healthy eating guidelines as noted in menu at bedside.  Pt had empty [employer] bag at bedside - stated he had cheeseburger and french fries - brought by his wife.    Please call [facility] @ [phone] with any hospice needs.  Thank you.  Joneen Boers, RN  [facility]  Hospice Liaison

## 2011-09-03 NOTE — Assessment & Plan Note (Signed)
He has recurrent decompensated HF with significant volume overload not responding to intensified outpatient therapy. Will admit for IV diuresis and inotropic support as needed.

## 2011-09-04 DIAGNOSIS — G4733 Obstructive sleep apnea (adult) (pediatric): Secondary | ICD-10-CM

## 2011-09-04 DIAGNOSIS — E119 Type 2 diabetes mellitus without complications: Secondary | ICD-10-CM

## 2011-09-04 LAB — HEMOGLOBIN A1C
Hgb A1c MFr Bld: 8.2 % — ABNORMAL HIGH (ref <5.7)
Mean Plasma Glucose: 189 mg/dL — ABNORMAL HIGH (ref <117)

## 2011-09-04 LAB — BASIC METABOLIC PANEL
CO2: 27 mEq/L (ref 19–32)
Chloride: 104 mEq/L (ref 96–112)
Creatinine, Ser: 1.76 mg/dL — ABNORMAL HIGH (ref 0.50–1.35)
Potassium: 4.6 mEq/L (ref 3.5–5.1)
Sodium: 139 mEq/L (ref 135–145)

## 2011-09-04 LAB — GLUCOSE, CAPILLARY
Glucose-Capillary: 80 mg/dL (ref 70–99)
Glucose-Capillary: 136 mg/dL — ABNORMAL HIGH (ref 70–99)

## 2011-09-04 MED ORDER — MILRINONE IN DEXTROSE 200-5 MCG/ML-% IV SOLN
0.1250 ug/kg/min | INTRAVENOUS | Status: DC
  Administered 2011-09-04 – 2011-09-07 (×4): 0.25 ug/kg/min via INTRAVENOUS
  Administered 2011-09-08: 0.125 ug/kg/min via INTRAVENOUS
  Filled 2011-09-04 (×7): qty 100

## 2011-09-04 MED ORDER — ZOLPIDEM TARTRATE 5 MG PO TABS
5.0000 mg | ORAL_TABLET | Freq: Every evening | ORAL | Status: DC | PRN
  Administered 2011-09-04 – 2011-09-08 (×5): 5 mg via ORAL
  Filled 2011-09-04 (×5): qty 1

## 2011-09-04 MED ORDER — OXYCODONE-ACETAMINOPHEN 5-325 MG PO TABS
2.0000 | ORAL_TABLET | ORAL | Status: DC | PRN
  Administered 2011-09-04 – 2011-09-09 (×14): 2 via ORAL
  Filled 2011-09-04 (×15): qty 2

## 2011-09-04 NOTE — Progress Notes (Signed)
Related admission, cardiomyopathy.  Patient is a DNR.  Found patient lethargic and concerned that he needs his pain and anxiety medications.  RN reviewed chart and spoke with Marylene Land, RN and patient was taken off his Percocet and placed on Tramadol for pain.  Patient states that Percocet is the only medication that helps him with his pain.  RN contacted Bailey Mech, NP and received orders to restart the Percocet as previously dosed.  Patient to receive Percocet (5/325mg ) two tablets every 4 hours as needed for pain.  Marylene Land, hospital RN, present for this conversation and placed order per Bailey Mech, NP.  RN could not locate any previously prescribed anxiety medications, either in hospital med list or Hospice med list.  RN advised that primary team would need to be involved to clarify patient's reported prescription for Klonopin.  RN advised that restarting Percocet would hopefully help with anxiety level.  Patient verbalized understanding and has no further needs at this time.  Encouraged a call to Hospice at [phone] with any needs.  Charlott Holler, RN, BSN Hospice

## 2011-09-04 NOTE — Progress Notes (Signed)
Advanced Heart Failure Rounding Note   Subjective:     [name] is a 46 y/o male with HTN and DM2 with longstanding systolic HF due to NICM (probably ETOH) currently on Hospice. Admitted from HF clinic on 8/16 with decompensated HF.   I/O -3L but weight down only 1 pound. Cr up from 1.5 to 1.7  Objective:   Weight Range:  Vital Signs:   Temp:  [97 F (36.1 C)-98.4 F (36.9 C)] 97.8 F (36.6 C) (08/17 0700) Pulse Rate:  [82-93] 84  (08/17 0015) Resp:  [18] 18  (08/17 0700) BP: (104-129)/(74-92) 121/89 mmHg (08/17 0605) SpO2:  [98 %-100 %] 100 % (08/17 0015) Weight:  [108.1 kg (238 lb 5.1 oz)-108.7 kg (239 lb 10.2 oz)] 108.1 kg (238 lb 5.1 oz) (08/17 0500)    Weight change: Filed Weights   09/03/11 1247 09/03/11 2020 09/04/11 0500  Weight: 108.7 kg (239 lb 10.2 oz) 108.7 kg (239 lb 10.2 oz) 108.1 kg (238 lb 5.1 oz)    Intake/Output:   Intake/Output Summary (Last 24 hours) at 09/04/11 0903 Last data filed at 09/04/11 0200  Gross per 24 hour  Intake    521 ml  Output   3850 ml  Net  -3329 ml     Physical Exam: General: Chronically ill appearing. No resp difficulty  HEENT: normal  Neck: supple. JVP jaw. Carotids 2+ bilaterally; no bruits. No lymphadenopathy or thryomegaly appreciated.  Cor: PMI normal. Regular rate & rhythm. +s3 2/6 MR  Lungs: clear  Abdomen: soft, nontender, + mildly distended. No hepatosplenomegaly. No bruits or masses. Good bowel sounds.  Extremities: no cyanosis, clubbing, rash, 2+ edema  Neuro: alert & orientedx3, cranial nerves grossly intact. Moves all 4 extremities w/o difficulty. Affect pleasant.    Telemetry: Sinus  Labs: Basic Metabolic Panel:  Lab 09/04/11 4132 09/03/11 1500  NA 139 134*  K 4.6 4.2  CL 104 101  CO2 27 25  GLUCOSE 139* 209*  BUN 26* 23  CREATININE 1.76* 1.57*  CALCIUM 8.8 9.2  MG -- --  PHOS -- --    Liver Function Tests: No results found for this basename: AST:5,ALT:5,ALKPHOS:5,BILITOT:5,PROT:5,ALBUMIN:5  in the last 168 hours No results found for this basename: LIPASE:5,AMYLASE:5 in the last 168 hours No results found for this basename: AMMONIA:3 in the last 168 hours  CBC:  Lab 09/03/11 1500  WBC 5.3  NEUTROABS 2.9  HGB 14.7  HCT 42.8  MCV 86.6  PLT 220    Cardiac Enzymes: No results found for this basename: CKTOTAL:5,CKMB:5,CKMBINDEX:5,TROPONINI:5 in the last 168 hours  BNP: BNP (last 3 results)  Basename 09/03/11 1502 06/13/11 1115 06/10/11 0544  PROBNP 3290.0* 3799.0* 2537.0*     Other results:  EKG:   Imaging:  No results found.   Medications:     Scheduled Medications:    . amitriptyline  25 mg Oral QHS  . atorvastatin  10 mg Oral QHS  . benazepril  40 mg Oral Daily  . digoxin  0.25 mg Oral Daily  . enoxaparin (LOVENOX) injection  40 mg Subcutaneous Q24H  . furosemide  80 mg Intravenous TID  . gabapentin  300 mg Oral TID  . glipiZIDE  10 mg Oral QAC breakfast  . hydrALAZINE  10 mg Oral Q8H  . insulin aspart  0-15 Units Subcutaneous TID WC  . insulin glargine  15 Units Subcutaneous QHS  . pantoprazole  40 mg Oral Daily  . potassium chloride  40 mEq Oral BID  . sodium  chloride  3 mL Intravenous Q12H  . DISCONTD: amitriptyline  25 mg Oral QHS  . DISCONTD: atorvastatin  10 mg Oral q1800  . DISCONTD: benazepril  40 mg Oral Daily  . DISCONTD: digoxin  0.25 mg Oral Daily  . DISCONTD: enoxaparin (LOVENOX) injection  40 mg Subcutaneous Q24H  . DISCONTD: furosemide  80 mg Intravenous TID  . DISCONTD: gabapentin  300 mg Oral TID  . DISCONTD: glipiZIDE  10 mg Oral Q breakfast  . DISCONTD: glipiZIDE  10 mg Oral QAC breakfast  . DISCONTD: hydrALAZINE  10 mg Oral Q8H  . DISCONTD: insulin aspart  0-5 Units Subcutaneous QHS  . DISCONTD: insulin glargine  15 Units Subcutaneous QHS  . DISCONTD: pantoprazole  40 mg Oral Q1200  . DISCONTD: potassium chloride  40 mEq Oral BID  . DISCONTD: sodium chloride  3 mL Intravenous Q12H     Infusions:     PRN  Medications:  sodium chloride, acetaminophen, ondansetron (ZOFRAN) IV, sodium chloride, traMADol, DISCONTD: sodium chloride, DISCONTD: acetaminophen, DISCONTD: ondansetron (ZOFRAN) IV, DISCONTD: sodium chloride   Assessment:   1) A/C Systolic HF  -on Hospice  2) NICM, ETOH  -EF 15% (3/13)  3) OSA on CPAP 4) DM2  5) H/o noncompliance  6) A/C renal failure due to cardiorenal syndrome   Plan/Discussion:    Diuresing fairly well but he is drinking copious amounts of fluid. Had 5 drinks on his breakfast tray. Long talk about need for fluid restriction (this is a recurring problem for him in the hospital). Place 1500cc fluid restriction.   Given worsening renal function will start low-dose milrinone. Will keep ACE-I for now. May have to cut back. No b-blocker yet.    Length of Stay: 1   Tremain Rucinski 09/04/2011, 9:03 AM

## 2011-09-04 NOTE — Plan of Care (Signed)
Problem: Phase I Progression Outcomes Goal: Pain controlled with appropriate interventions Outcome: Not Met (add Reason) Pt refuses to take currently prescribed pain medications; insists that only Percocet works for him Goal: EF % per last Echo/documented,Core Reminder form on chart Outcome: Completed/Met Date Met:  09/04/11 02/2011 - documented EF 20% on 2D echo

## 2011-09-04 NOTE — Progress Notes (Signed)
RT placed patient on CPAP 11cm H2o, medium mask. 3L oxygen bled in. Patient verbalized home settings to RT. Sterile water placed in resevoir. HR 87, RR 18, Sats: 100%. Patient tolerating well. RT will continue to monitor.

## 2011-09-05 LAB — GLUCOSE, CAPILLARY
Glucose-Capillary: 95 mg/dL (ref 70–99)
Glucose-Capillary: 100 mg/dL — ABNORMAL HIGH (ref 70–99)
Glucose-Capillary: 147 mg/dL — ABNORMAL HIGH (ref 70–99)
Glucose-Capillary: 137 mg/dL — ABNORMAL HIGH (ref 70–99)

## 2011-09-05 LAB — BASIC METABOLIC PANEL
CO2: 29 mEq/L (ref 19–32)
Chloride: 103 mEq/L (ref 96–112)
Creatinine, Ser: 1.67 mg/dL — ABNORMAL HIGH (ref 0.50–1.35)
GFR calc Af Amer: 56 mL/min — ABNORMAL LOW (ref >90)
Potassium: 4.5 mEq/L (ref 3.5–5.1)
Sodium: 139 mEq/L (ref 135–145)

## 2011-09-05 MED ORDER — CLONAZEPAM 0.5 MG PO TABS
0.5000 mg | ORAL_TABLET | Freq: Two times a day (BID) | ORAL | Status: DC
  Administered 2011-09-05 – 2011-09-06 (×3): 0.5 mg via ORAL
  Filled 2011-09-05 (×4): qty 1

## 2011-09-05 MED ORDER — ALPRAZOLAM 0.25 MG PO TABS: 0.2500 mg | ORAL_TABLET | Freq: Three times a day (TID) | ORAL | Status: DC | PRN

## 2011-09-05 NOTE — Progress Notes (Signed)
Related admission, cardiomyopathy.  Found patient with darkened room, door closed.  RN did not disturb.  Reviewed chart and patient continues to respond to Milrinone and IV Lasix, although MD notes continued issues with maintaining fluid restrictions.  Patient has been restarted on Klonopin per Marylene Land, hospital RN.  Patient was experiencing great anxiety and Bailey Mech, NP authorized prescription.  Marylene Land reports that patient has been responding well to the Percocet with addition of Klonopin.  No further needs at this time, continue current plan of care.  Encourage a call to Hospice with any further issues.    Charlott Holler, RN, BSN Hospice

## 2011-09-05 NOTE — Progress Notes (Signed)
Advanced Heart Failure Rounding Note   Subjective:     [name] is a 46 y/o male with HTN and DM2 with longstanding systolic HF due to NICM (probably ETOH) currently on Hospice. Admitted from HF clinic on 8/16 with decompensated HF.   Milrinone started 8/17 due to worsening renal function. Reminded to watch po intake. 5L negative yesterday but weight only down 4 pounds. BMET pending. Breathing better. Swelling improving.  Objective:   Weight Range:  Vital Signs:   Temp:  [96.6 F (35.9 C)-97.8 F (36.6 C)] 97.5 F (36.4 C) (08/18 0800) Pulse Rate:  [81-87] 81  (08/18 0800) Resp:  [16-21] 21  (08/18 0000) BP: (99-117)/(67-78) 114/75 mmHg (08/18 0800) SpO2:  [96 %-99 %] 96 % (08/18 0800) Weight:  [106.2 kg (234 lb 2.1 oz)] 106.2 kg (234 lb 2.1 oz) (08/18 0500)    Weight change: Filed Weights   09/03/11 2020 09/04/11 0500 09/05/11 0500  Weight: 108.7 kg (239 lb 10.2 oz) 108.1 kg (238 lb 5.1 oz) 106.2 kg (234 lb 2.1 oz)    Intake/Output:   Intake/Output Summary (Last 24 hours) at 09/05/11 0850 Last data filed at 09/05/11 0500  Gross per 24 hour  Intake 1972.54 ml  Output   7025 ml  Net -5052.46 ml     Physical Exam: General: Chronically ill appearing. No resp difficulty  HEENT: normal  Neck: supple. JVP jaw. Carotids 2+ bilaterally; no bruits. No lymphadenopathy or thryomegaly appreciated.  Cor: PMI normal. Regular rate & rhythm. +s3 2/6 MR  Lungs: clear  Abdomen: soft, nontender, + mildly distended. No hepatosplenomegaly. No bruits or masses. Good bowel sounds.  Extremities: no cyanosis, clubbing, rash, 2+ edema  Neuro: alert & orientedx3, cranial nerves grossly intact. Moves all 4 extremities w/o difficulty. Affect pleasant.    Telemetry: Sinus  Labs: Basic Metabolic Panel:  Lab 09/04/11 0454 09/03/11 1500  NA 139 134*  K 4.6 4.2  CL 104 101  CO2 27 25  GLUCOSE 139* 209*  BUN 26* 23  CREATININE 1.76* 1.57*  CALCIUM 8.8 9.2  MG -- --  PHOS -- --     Liver Function Tests: No results found for this basename: AST:5,ALT:5,ALKPHOS:5,BILITOT:5,PROT:5,ALBUMIN:5 in the last 168 hours No results found for this basename: LIPASE:5,AMYLASE:5 in the last 168 hours No results found for this basename: AMMONIA:3 in the last 168 hours  CBC:  Lab 09/03/11 1500  WBC 5.3  NEUTROABS 2.9  HGB 14.7  HCT 42.8  MCV 86.6  PLT 220    Cardiac Enzymes: No results found for this basename: CKTOTAL:5,CKMB:5,CKMBINDEX:5,TROPONINI:5 in the last 168 hours  BNP: BNP (last 3 results)  Basename 09/03/11 1502 06/13/11 1115 06/10/11 0544  PROBNP 3290.0* 3799.0* 2537.0*     Other results:  EKG:   Imaging: No results found.   Medications:     Scheduled Medications:    . amitriptyline  25 mg Oral QHS  . atorvastatin  10 mg Oral QHS  . benazepril  40 mg Oral Daily  . digoxin  0.25 mg Oral Daily  . enoxaparin (LOVENOX) injection  40 mg Subcutaneous Q24H  . furosemide  80 mg Intravenous TID  . gabapentin  300 mg Oral TID  . glipiZIDE  10 mg Oral QAC breakfast  . hydrALAZINE  10 mg Oral Q8H  . insulin aspart  0-15 Units Subcutaneous TID WC  . insulin glargine  15 Units Subcutaneous QHS  . pantoprazole  40 mg Oral Daily  . potassium chloride  40 mEq Oral  BID  . sodium chloride  3 mL Intravenous Q12H    Infusions:    . milrinone 0.25 mcg/kg/min (09/05/11 0500)    PRN Medications: sodium chloride, acetaminophen, ondansetron (ZOFRAN) IV, oxyCODONE-acetaminophen, sodium chloride, traMADol, zolpidem   Assessment:   1) A/C Systolic HF  -on Hospice  2) NICM, ETOH  -EF 15% (3/13)  3) OSA on CPAP 4) DM2  5) H/o noncompliance  6) A/C renal failure due to cardiorenal syndrome   Plan/Discussion:    Diuresing fairly well but intake is still high. Continue milrinone and IV lasix.   Reminded him (again) about need for fluid restriction (this is a recurring problem for him in the hospital). 1500cc fluid restriction in place.   Await  BMET. Will keep ACE-I for now. May have to cut back. No b-blocker yet.    Length of Stay: 2   Arvilla Meres 09/05/2011, 8:50 AM

## 2011-09-05 NOTE — Progress Notes (Signed)
Pt extremely anxious/visible sweating and hands trembling.pt states he needs his klonopin. np [facility] called. np asked rn to verify dose via his pharmacy. [facility] and [facility] in July. Order for klonopin obtained and given to patient and added to med rec via pharmacy

## 2011-09-05 NOTE — Progress Notes (Signed)
Pt placed on CPAP with a pressure of 11.0 and a 3L oxygen bleed in per home settings. Tolerating well at this time, RT will continue to monitor.

## 2011-09-06 ENCOUNTER — Encounter: Payer: Self-pay | Admitting: Internal Medicine

## 2011-09-06 LAB — GLUCOSE, CAPILLARY
Glucose-Capillary: 96 mg/dL (ref 70–99)
Glucose-Capillary: 102 mg/dL — ABNORMAL HIGH (ref 70–99)
Glucose-Capillary: 168 mg/dL — ABNORMAL HIGH (ref 70–99)

## 2011-09-06 LAB — BASIC METABOLIC PANEL
BUN: 21 mg/dL (ref 6–23)
Chloride: 100 mEq/L (ref 96–112)
GFR calc Af Amer: 59 mL/min — ABNORMAL LOW (ref >90)
GFR calc non Af Amer: 50 mL/min — ABNORMAL LOW (ref >90)
Potassium: 4.3 mEq/L (ref 3.5–5.1)
Sodium: 136 mEq/L (ref 135–145)

## 2011-09-06 MED ORDER — FENTANYL CITRATE 0.05 MG/ML IJ SOLN
50.0000 ug | Freq: Four times a day (QID) | INTRAMUSCULAR | Status: DC | PRN
  Administered 2011-09-07 – 2011-09-08 (×3): 50 ug via INTRAVENOUS
  Filled 2011-09-06 (×3): qty 2

## 2011-09-06 MED ORDER — FENTANYL CITRATE 0.05 MG/ML IJ SOLN
50.0000 ug | Freq: Once | INTRAMUSCULAR | Status: AC
  Administered 2011-09-06: 50 ug via INTRAVENOUS

## 2011-09-06 MED ORDER — SODIUM CHLORIDE 0.9 % IV SOLN: 50.0000 ug/h | Freq: Once | INTRAVENOUS | Status: DC

## 2011-09-06 MED ORDER — DOCUSATE SODIUM 100 MG PO CAPS
100.0000 mg | ORAL_CAPSULE | Freq: Two times a day (BID) | ORAL | Status: DC | PRN
  Administered 2011-09-06 – 2011-09-09 (×3): 100 mg via ORAL
  Filled 2011-09-06 (×2): qty 1

## 2011-09-06 MED ORDER — FENTANYL CITRATE 0.05 MG/ML IJ SOLN
INTRAMUSCULAR | Status: AC
  Administered 2011-09-06: 50 ug via INTRAVENOUS
  Filled 2011-09-06: qty 2

## 2011-09-06 MED ORDER — CLONAZEPAM 0.5 MG PO TABS
0.5000 mg | ORAL_TABLET | Freq: Three times a day (TID) | ORAL | Status: DC
  Administered 2011-09-06 – 2011-09-09 (×9): 0.5 mg via ORAL
  Filled 2011-09-06 (×9): qty 1

## 2011-09-06 NOTE — Progress Notes (Signed)
Pt: [name]  Rm: [phone]   [facility] Homecare Chaplain note  Hospice homecare chaplain visited to assess for spiritual needs. Pt expressed he is very worried about his wife and children and how she is coping with his hospital stay.  During chaplain's visit pt's wife's friend called to say she was going to get his wife and their youngest child, and take her to her home.  This was very comforting to the pt, and he indicated he felt much of his anxiety draining away, since he knew the other two children are staying with friends.  Chaplain utilized active listening, normalizing feelings, and providing pastoral presence and assurance of prayer.  Chaplain will follow.  Beverly Isley-Landreth  ThM, BCCC [facility] Clinical Chaplain

## 2011-09-06 NOTE — Progress Notes (Signed)
Advanced Heart Failure Rounding Note   Subjective:     [name] is a 46 y/o male with HTN and DM2 with longstanding systolic HF due to NICM (probably ETOH) currently on Hospice. Admitted from HF clinic on 8/16 with decompensated HF.   Milrinone started 8/17 due to worsening renal function. Net 3.9L negative yesterday but weight only down 2 pounds. Breathing better. Swelling improving.  Objective:   Weight Range:  Vital Signs:   Temp:  [97.5 F (36.4 C)-98 F (36.7 C)] 98 F (36.7 C) (08/19 0515) Pulse Rate:  [91-103] 95  (08/19 0515) Resp:  [18-22] 22  (08/19 0515) BP: (113-130)/(71-81) 113/73 mmHg (08/19 0515) SpO2:  [94 %-99 %] 94 % (08/19 0515) Weight:  [105.6 kg (232 lb 12.9 oz)] 105.6 kg (232 lb 12.9 oz) (08/19 0500)    Weight change: Filed Weights   09/04/11 0500 09/05/11 0500 09/06/11 0500  Weight: 108.1 kg (238 lb 5.1 oz) 106.2 kg (234 lb 2.1 oz) 105.6 kg (232 lb 12.9 oz)    Intake/Output:   Intake/Output Summary (Last 24 hours) at 09/06/11 0803 Last data filed at 09/06/11 0600  Gross per 24 hour  Intake   2006 ml  Output   6000 ml  Net  -3994 ml     Physical Exam: General: Chronically ill appearing. No resp difficulty  HEENT: normal  Neck: supple. JVP 9-10. Carotids 2+ bilaterally; no bruits. No lymphadenopathy or thryomegaly appreciated.  Cor: PMI normal. Regular rate & rhythm. +s3 2/6 MR  Lungs: clear  Abdomen: soft, nontender, + mildly distended. No hepatosplenomegaly. No bruits or masses. Good bowel sounds.  Extremities: no cyanosis, clubbing, rash, 1+ edema  Neuro: alert & orientedx3, cranial nerves grossly intact. Moves all 4 extremities w/o difficulty. Affect pleasant.    Telemetry: Sinus  Labs: Basic Metabolic Panel:  Lab 09/06/11 1308 09/05/11 0830 09/04/11 0623 09/03/11 1500  NA 136 139 139 134*  K 4.3 4.5 4.6 4.2  CL 100 103 104 101  CO2 28 29 27 25   GLUCOSE 90 73 139* 209*  BUN 21 25* 26* 23  CREATININE 1.62* 1.67* 1.76* 1.57*    CALCIUM 9.2 8.8 8.8 --  MG -- -- -- --  PHOS -- -- -- --    Liver Function Tests: No results found for this basename: AST:5,ALT:5,ALKPHOS:5,BILITOT:5,PROT:5,ALBUMIN:5 in the last 168 hours No results found for this basename: LIPASE:5,AMYLASE:5 in the last 168 hours No results found for this basename: AMMONIA:3 in the last 168 hours  CBC:  Lab 09/03/11 1500  WBC 5.3  NEUTROABS 2.9  HGB 14.7  HCT 42.8  MCV 86.6  PLT 220    Cardiac Enzymes: No results found for this basename: CKTOTAL:5,CKMB:5,CKMBINDEX:5,TROPONINI:5 in the last 168 hours  BNP: BNP (last 3 results)  Basename 09/03/11 1502 06/13/11 1115 06/10/11 0544  PROBNP 3290.0* 3799.0* 2537.0*     Other results:  EKG:   Imaging: No results found.   Medications:     Scheduled Medications:    . amitriptyline  25 mg Oral QHS  . atorvastatin  10 mg Oral QHS  . benazepril  40 mg Oral Daily  . clonazePAM  0.5 mg Oral BID  . digoxin  0.25 mg Oral Daily  . enoxaparin (LOVENOX) injection  40 mg Subcutaneous Q24H  . furosemide  80 mg Intravenous TID  . gabapentin  300 mg Oral TID  . glipiZIDE  10 mg Oral QAC breakfast  . hydrALAZINE  10 mg Oral Q8H  . insulin aspart  0-15 Units Subcutaneous TID WC  . insulin glargine  15 Units Subcutaneous QHS  . pantoprazole  40 mg Oral Daily  . potassium chloride  40 mEq Oral BID  . sodium chloride  3 mL Intravenous Q12H    Infusions:    . milrinone 0.25 mcg/kg/min (09/05/11 2000)    PRN Medications: sodium chloride, acetaminophen, ondansetron (ZOFRAN) IV, oxyCODONE-acetaminophen, sodium chloride, traMADol, zolpidem, DISCONTD: ALPRAZolam   Assessment:   1) A/C Systolic HF  -on Hospice  2) NICM, ETOH  -EF 15% (3/13)  3) OSA on CPAP 4) DM2  5) H/o noncompliance  6) A/C renal failure due to cardiorenal syndrome   Plan/Discussion:    Diuresing well but drinking lots of fluid and crunching ice. Weight down 7 pounds.  Suspect still has 7-8 pounds to go.   Continue milrinone and IV lasix.   Cr stable.   Have placed on 1500cc fluid restriction.  Have also added 2 g sodium restriction.  Hold off on b-blocker at this time.     Length of Stay: 3 Yamileth Hayse,MD 8:04 AM

## 2011-09-06 NOTE — Progress Notes (Signed)
Room 2922 - [name] -  [facility] RN Visit  Related admission to [facility] diagnosis of ETOH cardiomyopathy.  Pt is DNR   code.    Pt alert & oriented, sitting up in bed, with complaints of pain at 4/10, which is great improvement for pt - states he is getting his Percocet for pain.   [facility] SW present- allowed SW to complete her visit following brief interruption for this RN.  Please call [facility] @ [phone] with any hospice needs.  Thank you.  Joneen Boers, RN  [facility]  Hospice Liaison

## 2011-09-06 NOTE — Care Management Note (Signed)
    Page 1 of 1   09/06/2011     12:25:23 PM   CARE MANAGEMENT NOTE 09/06/2011  Patient:  [name], [name]   Account Number:  [id number]  Date Initiated:  09/06/2011  Documentation initiated by:  Junius Creamer  Subjective/Objective Assessment:   adm w heart failure     Action/Plan:   lives w wife, pcp dr Loistine Chance, act w [facility]   Anticipated DC Date:     Anticipated DC Plan:        DC Planning Services  CM consult      [facility] Choice  Resumption Of Svcs/PTA Provider   Choice offered to / List presented to:          [facility] arranged  HH-1 RN  HH-2 PT      HH agency  HOSPICE   Status of service:   Medicare Important Message given?   (If response is "NO", the following Medicare IM given date fields will be blank) Date Medicare IM given:   Date Additional Medicare IM given:    Discharge Disposition:  HOME W HOSPICE CARE  Per UR Regulation:  Reviewed for med. necessity/level of care/duration of stay  If discussed at Long Length of Stay Meetings, dates discussed:    Comments:  8/19 12:24p debbie Thea Holshouser rn,bsn [phone]

## 2011-09-06 NOTE — Progress Notes (Signed)
Brief Nutrition Note:   Pt is well known to this RD for diet education and meal preferences in the past. Pt states he is not getting enough to eat and that when he gets his trays, they are not accurate. RD explained to pt his current diet and how that restricts his meals. Pt tends to get upset easily if told no, but is understanding if a reason why is explained.   RD has also spoken with ambassador about pt trays, she reports that pt has gotten several snacks between meals, and has been aggressive towards staff about his meals.   RD will order pt snacks for AM and PM, to be in unit nourishment room. Also took breakfast order for tomorrow.   Chart and medications reviewed, no further nutrition interventions at this time. Please re-consult as needed.   Clarene Duke RD, LDN Pager [phone] After Hours pager [phone]

## 2011-09-06 NOTE — Progress Notes (Signed)
RM 2922     Glendon Axe                                 [facility] SW Note:  Pt was napping when Sw arrived but he was easily awakened. He reported pain but stated that the pain was not severe. Explained that Sw had been to see his family prior to this visit to drop off a few supplies and that they were doing well. He was relieved to hear the news. Sw actively listened as pt shared some of his history. He was very grateful for the listening ear. He anticipates that he will be returning home in a few days. Explained that his hospice homecare team will visit him the day following his discharge home. He denied any immediate needs. Althia Forts, LCSW

## 2011-09-07 DIAGNOSIS — I5023 Acute on chronic systolic (congestive) heart failure: Secondary | ICD-10-CM

## 2011-09-07 LAB — BASIC METABOLIC PANEL
CO2: 32 mEq/L (ref 19–32)
Calcium: 9.6 mg/dL (ref 8.4–10.5)
GFR calc non Af Amer: 45 mL/min — ABNORMAL LOW (ref >90)
Sodium: 134 mEq/L — ABNORMAL LOW (ref 135–145)

## 2011-09-07 MED ORDER — CARVEDILOL 3.125 MG PO TABS
3.1250 mg | ORAL_TABLET | Freq: Two times a day (BID) | ORAL | Status: DC
  Administered 2011-09-07 – 2011-09-09 (×5): 3.125 mg via ORAL
  Filled 2011-09-07 (×7): qty 1

## 2011-09-07 MED ORDER — TORSEMIDE 20 MG PO TABS
80.0000 mg | ORAL_TABLET | Freq: Every day | ORAL | Status: DC
  Administered 2011-09-07: 80 mg via ORAL
  Filled 2011-09-07 (×2): qty 4

## 2011-09-07 MED ORDER — TORSEMIDE 20 MG PO TABS
60.0000 mg | ORAL_TABLET | Freq: Every day | ORAL | Status: DC
  Filled 2011-09-07: qty 3

## 2011-09-07 MED ORDER — BISACODYL 5 MG PO TBEC
5.0000 mg | DELAYED_RELEASE_TABLET | Freq: Every day | ORAL | Status: DC | PRN
  Administered 2011-09-07 – 2011-09-09 (×2): 5 mg via ORAL
  Filled 2011-09-07 (×2): qty 1

## 2011-09-07 NOTE — Progress Notes (Signed)
Room 2922 - Artyom Stencel - [facility] RN Visit  Related admission to [facility] diagnosis of ETOH cardiomyopathy. Pt is DNR code.    Pt alert & oriented, sitting up in bed, with complaints of pain at level of 8/10 - states he has had no Percocet since last night, appears and states he is frustrated that dietary cannot get his food right.  Pt states they never bring what he ordered.  Pt states he wants some fresh fruit (banana, orange or apple), not fruit cups.  Discussed with secretary who will check with dietary to see if this is available.  No family present.  Per attending note in EPIC, pt to be discharged back to home tomorrow with [facility] continuing home care.    Discussed with pt any DME needs in home - pt denied hospital bed, etc but states he needs clothes, food - everything.  [facility] SW working with pt and social needs.   Please call [facility] @ [phone] with any hospice needs.  Thank you.  Joneen Boers, RN  [facility]  Hospice Liaison

## 2011-09-07 NOTE — Progress Notes (Signed)
Advanced Heart Failure Rounding Note   Subjective:     [name] is a 47 y/o male with HTN and DM2 with longstanding systolic HF due to NICM (probably ETOH) currently on Hospice. Admitted from HF clinic on 8/16 with decompensated HF.   Milrinone started 8/17 due to worsening renal function. Net 7.2L negative yesterday.  Weight down 16 pounds since admit.  Ambulated in halls yesterday without difficulty.  Lower extremity edema improved.    Objective:   Weight Range:  Vital Signs:   Temp:  [97.9 F (36.6 C)-98.7 F (37.1 C)] 98.2 F (36.8 C) (08/20 0756) Pulse Rate:  [89-106] 99  (08/20 0756) Resp:  [14-20] 14  (08/20 0400) BP: (111-119)/(63-86) 115/70 mmHg (08/20 0752) SpO2:  [95 %-99 %] 98 % (08/20 0756) Weight:  [223 lb 12.3 oz (101.5 kg)] 223 lb 12.3 oz (101.5 kg) (08/20 0600)    Weight change: Filed Weights   09/05/11 0500 09/06/11 0500 09/07/11 0600  Weight: 234 lb 2.1 oz (106.2 kg) 232 lb 12.9 oz (105.6 kg) 223 lb 12.3 oz (101.5 kg)    Intake/Output:   Intake/Output Summary (Last 24 hours) at 09/07/11 0821 Last data filed at 09/07/11 0500  Gross per 24 hour  Intake   1058 ml  Output   8350 ml  Net  -7292 ml     Physical Exam: General: Chronically ill appearing. No resp difficulty  HEENT: normal  Neck: supple. JVP 5-6. Carotids 2+ bilaterally; no bruits. No lymphadenopathy or thryomegaly appreciated.  Cor: PMI normal. Regular rate & rhythm. +s3 2/6 MR  Lungs: clear  Abdomen: soft, nontender, + mildly distended. No hepatosplenomegaly. No bruits or masses. Good bowel sounds.  Extremities: no cyanosis, clubbing, rash, edema  Neuro: alert & orientedx3, cranial nerves grossly intact. Moves all 4 extremities w/o difficulty. Affect pleasant.    Telemetry: Sinus  Labs: Basic Metabolic Panel:  Lab 09/07/11 1610 09/06/11 0520 09/05/11 0830 09/04/11 0623 09/03/11 1500  NA 134* 136 139 139 134*  K 5.2* 4.3 4.5 4.6 4.2  CL 95* 100 103 104 101  CO2 32 28 29 27 25     GLUCOSE 117* 90 73 139* 209*  BUN 25* 21 25* 26* 23  CREATININE 1.78* 1.62* 1.67* 1.76* 1.57*  CALCIUM 9.6 9.2 8.8 -- --  MG -- -- -- -- --  PHOS -- -- -- -- --    Liver Function Tests: No results found for this basename: AST:5,ALT:5,ALKPHOS:5,BILITOT:5,PROT:5,ALBUMIN:5 in the last 168 hours No results found for this basename: LIPASE:5,AMYLASE:5 in the last 168 hours No results found for this basename: AMMONIA:3 in the last 168 hours  CBC:  Lab 09/03/11 1500  WBC 5.3  NEUTROABS 2.9  HGB 14.7  HCT 42.8  MCV 86.6  PLT 220    Cardiac Enzymes: No results found for this basename: CKTOTAL:5,CKMB:5,CKMBINDEX:5,TROPONINI:5 in the last 168 hours  BNP: BNP (last 3 results)  Basename 09/03/11 1502 06/13/11 1115 06/10/11 0544  PROBNP 3290.0* 3799.0* 2537.0*     Other results:  EKG:   Imaging: No results found.   Medications:     Scheduled Medications:    . amitriptyline  25 mg Oral QHS  . atorvastatin  10 mg Oral QHS  . benazepril  40 mg Oral Daily  . carvedilol  3.125 mg Oral BID WC  . clonazePAM  0.5 mg Oral TID  . digoxin  0.25 mg Oral Daily  . enoxaparin (LOVENOX) injection  40 mg Subcutaneous Q24H  . fentaNYL  50 mcg Intravenous  Once  . gabapentin  300 mg Oral TID  . glipiZIDE  10 mg Oral QAC breakfast  . hydrALAZINE  10 mg Oral Q8H  . insulin aspart  0-15 Units Subcutaneous TID WC  . insulin glargine  15 Units Subcutaneous QHS  . pantoprazole  40 mg Oral Daily  . sodium chloride  3 mL Intravenous Q12H  . torsemide  80 mg Oral Daily  . DISCONTD: clonazePAM  0.5 mg Oral BID  . DISCONTD: fentaNYL infusion INTRAVENOUS  50 mcg/hr Intravenous Once  . DISCONTD: furosemide  80 mg Intravenous TID  . DISCONTD: potassium chloride  40 mEq Oral BID  . DISCONTD: torsemide  60 mg Oral Daily    Infusions:    . milrinone 0.25 mcg/kg/min (09/07/11 0600)    PRN Medications: sodium chloride, acetaminophen, docusate sodium, fentaNYL, ondansetron (ZOFRAN) IV,  oxyCODONE-acetaminophen, sodium chloride, traMADol, zolpidem   Assessment:   1) A/C Systolic HF  -on Hospice  2) NICM, ETOH  -EF 15% (3/13)  3) OSA on CPAP 4) DM2  5) H/o noncompliance  6) A/C renal failure due to cardiorenal syndrome 7) Hyperkalemia   Plan/Discussion:    Diuresis improved with milrinone, IV lasix and fluid restrictions, weight down 16 pounds.  Will begin to wean milrinone today, transition to demadex 80 mg daily (lasix 160/80 home regimen).  Will restart low dose beta blocker.  Hold potassium today.   Plan for discharge home with hospice tomorrow.  Can go to telemetry.    [name] 09/07/2011 8:22 AM  Length of Stay: 4

## 2011-09-08 LAB — BASIC METABOLIC PANEL
BUN: 29 mg/dL — ABNORMAL HIGH (ref 6–23)
CO2: 27 mEq/L (ref 19–32)
Calcium: 9.5 mg/dL (ref 8.4–10.5)
Creatinine, Ser: 1.78 mg/dL — ABNORMAL HIGH (ref 0.50–1.35)
Glucose, Bld: 128 mg/dL — ABNORMAL HIGH (ref 70–99)

## 2011-09-08 LAB — GLUCOSE, CAPILLARY
Glucose-Capillary: 119 mg/dL — ABNORMAL HIGH (ref 70–99)
Glucose-Capillary: 152 mg/dL — ABNORMAL HIGH (ref 70–99)
Glucose-Capillary: 181 mg/dL — ABNORMAL HIGH (ref 70–99)

## 2011-09-08 MED ORDER — TORSEMIDE 20 MG PO TABS
60.0000 mg | ORAL_TABLET | Freq: Every day | ORAL | Status: DC
  Administered 2011-09-08 – 2011-09-09 (×2): 60 mg via ORAL
  Filled 2011-09-08 (×2): qty 3

## 2011-09-08 MED ORDER — MORPHINE SULFATE 2 MG/ML IJ SOLN
2.0000 mg | Freq: Once | INTRAMUSCULAR | Status: AC
  Administered 2011-09-08: 2 mg via INTRAVENOUS
  Filled 2011-09-08: qty 1

## 2011-09-08 MED ORDER — BISACODYL 5 MG PO TBEC
10.0000 mg | DELAYED_RELEASE_TABLET | Freq: Once | ORAL | Status: AC
  Administered 2011-09-08: 10 mg via ORAL
  Filled 2011-09-08: qty 2

## 2011-09-08 MED ORDER — MORPHINE SULFATE 2 MG/ML IJ SOLN
2.0000 mg | Freq: Once | INTRAMUSCULAR | Status: AC
Start: 2011-09-08 — End: 2011-09-08
  Administered 2011-09-08: 2 mg via INTRAVENOUS

## 2011-09-08 MED ORDER — MORPHINE SULFATE 2 MG/ML IJ SOLN
INTRAMUSCULAR | Status: AC
  Administered 2011-09-08: 2 mg via INTRAVENOUS
  Filled 2011-09-08: qty 1

## 2011-09-08 MED ORDER — DOCUSATE SODIUM 100 MG PO CAPS
100.0000 mg | ORAL_CAPSULE | Freq: Once | ORAL | Status: AC
  Administered 2011-09-08: 100 mg via ORAL
  Filled 2011-09-08 (×2): qty 1

## 2011-09-08 NOTE — Progress Notes (Signed)
Pt. Refuses to wear CPAP at this time. Pt. Stated that his head hurt & he didn't want to wear CPAP. Pt.'s vitals are stable at this time & pt. Isn't in any distress.

## 2011-09-08 NOTE — Progress Notes (Signed)
Room 2922-> moved to 4707 - Glendon Axe -  [facility] RN Visit  Related admission to [facility] diagnosis of ETOH Cardiomyopathy. Pt is DNR code.    Pt alert & oriented, sitting up in bed, with multiple complaints about care- pt states he can;t get pain meds, unable to get food orders straight, states he is so upset he could "rip all this out of my arm and get out of here..."   States they will not give him anything for his anxiety.   No family present.    Discussed with staff RN - suggested MD be called for anxiety symptom management and pain management.  Last dose of pain med was 10am (IV 2mg  MS) and last dose of Percocet just after midnight (039) last pm- pt states pain is 10/10.  Discussed with pt that he did appear drowsy - he continued to state adamantly that he was in pain.    Please call [facility] @ [phone] with any hospice needs.  Thank you.  Joneen Boers, RN  [facility]  Hospice Liaison

## 2011-09-08 NOTE — Progress Notes (Signed)
Notified by Hospice NP that patient complaining of pain(headache) and anxiety.NP advised to go ahead and administer PRN pain med although patient had one time dose of morphine at 10am and patient is drowsy, for patient has a high tolerance of  pain. Hospice NP explained to nurse if nurse can call MD to get something for anxiety for patient will have an anxiety attack. VSS -126/76 HR-90. PRN percocet given per PRN order.  Doctor has been paged waiting for Card Master to return call. Will follow-up and continue to monitor patient

## 2011-09-08 NOTE — Progress Notes (Signed)
Advanced Heart Failure Rounding Note   Subjective:    [name] is a 46 y/o male with HTN and DM2 with longstanding systolic HF due to NICM (probably ETOH) currently on Hospice. Admitted from HF clinic on 8/16 with decompensated HF.   Milrinone started 8/17 due to worsening renal function.   Milrinone weaned to 0.125 mcg/kg/min on 8/20.  Net 5.5L negative yesterday.  Ambulating in halls without difficulty.  C/o HA.    Objective:   Weight Range:  Vital Signs:   Temp:  [97.6 F (36.4 C)-98.3 F (36.8 C)] 97.6 F (36.4 C) (08/21 0745) Pulse Rate:  [87-102] 87  (08/21 0745) Resp:  [16-22] 17  (08/21 0745) BP: (109-123)/(67-80) 115/76 mmHg (08/21 0745) SpO2:  [92 %-98 %] 96 % (08/21 0745) Weight:  [219 lb 9.3 oz (99.6 kg)] 219 lb 9.3 oz (99.6 kg) (08/21 0500)    Weight change: Filed Weights   09/06/11 0500 09/07/11 0600 09/08/11 0500  Weight: 232 lb 12.9 oz (105.6 kg) 223 lb 12.3 oz (101.5 kg) 219 lb 9.3 oz (99.6 kg)    Intake/Output:   Intake/Output Summary (Last 24 hours) at 09/08/11 0949 Last data filed at 09/08/11 0900  Gross per 24 hour  Intake 1794.3 ml  Output   6625 ml  Net -4830.7 ml     Physical Exam: General: Chronically ill appearing. No resp difficulty  HEENT: normal  Neck: supple. JVP 7-8. Carotids 2+ bilaterally; no bruits. No lymphadenopathy or thryomegaly appreciated.  Cor: PMI normal. Regular rate & rhythm. +s3 2/6 MR  Lungs: clear  Abdomen: soft, nontender, + mildly distended. No hepatosplenomegaly. No bruits or masses. Good bowel sounds.  Extremities: no cyanosis, clubbing, rash, edema  Neuro: alert & orientedx3, cranial nerves grossly intact. Moves all 4 extremities w/o difficulty. Affect pleasant.    Telemetry: Sinus  Labs: Basic Metabolic Panel:  Lab 09/08/11 1610 09/07/11 0450 09/06/11 0520 09/05/11 0830 09/04/11 0623  NA 134* 134* 136 139 139  K 5.1 5.2* 4.3 4.5 4.6  CL 97 95* 100 103 104  CO2 27 32 28 29 27   GLUCOSE 128* 117* 90 73  139*  BUN 29* 25* 21 25* 26*  CREATININE 1.78* 1.78* 1.62* 1.67* 1.76*  CALCIUM 9.5 9.6 9.2 -- --  MG -- -- -- -- --  PHOS -- -- -- -- --    Liver Function Tests: No results found for this basename: AST:5,ALT:5,ALKPHOS:5,BILITOT:5,PROT:5,ALBUMIN:5 in the last 168 hours No results found for this basename: LIPASE:5,AMYLASE:5 in the last 168 hours No results found for this basename: AMMONIA:3 in the last 168 hours  CBC:  Lab 09/03/11 1500  WBC 5.3  NEUTROABS 2.9  HGB 14.7  HCT 42.8  MCV 86.6  PLT 220    Cardiac Enzymes: No results found for this basename: CKTOTAL:5,CKMB:5,CKMBINDEX:5,TROPONINI:5 in the last 168 hours  BNP: BNP (last 3 results)  Basename 09/03/11 1502 06/13/11 1115 06/10/11 0544  PROBNP 3290.0* 3799.0* 2537.0*     Other results:  EKG:   Imaging: No results found.   Medications:     Scheduled Medications:    . amitriptyline  25 mg Oral QHS  . atorvastatin  10 mg Oral QHS  . benazepril  40 mg Oral Daily  . bisacodyl  10 mg Oral Once  . carvedilol  3.125 mg Oral BID WC  . clonazePAM  0.5 mg Oral TID  . digoxin  0.25 mg Oral Daily  . docusate sodium  100 mg Oral Once  . enoxaparin (LOVENOX) injection  40 mg Subcutaneous Q24H  . gabapentin  300 mg Oral TID  . glipiZIDE  10 mg Oral QAC breakfast  . hydrALAZINE  10 mg Oral Q8H  . insulin aspart  0-15 Units Subcutaneous TID WC  . insulin glargine  15 Units Subcutaneous QHS  .  morphine injection  2 mg Intravenous Once  .  morphine injection  2 mg Intravenous Once  . pantoprazole  40 mg Oral Daily  . sodium chloride  3 mL Intravenous Q12H  . torsemide  60 mg Oral Daily  . DISCONTD: torsemide  80 mg Oral Daily    Infusions:    . DISCONTD: milrinone 0.125 mcg/kg/min (09/08/11 0134)    PRN Medications: sodium chloride, acetaminophen, bisacodyl, docusate sodium, fentaNYL, ondansetron (ZOFRAN) IV, oxyCODONE-acetaminophen, sodium chloride, traMADol, zolpidem   Assessment:   1) A/C  Systolic HF  -on Hospice  2) NICM, ETOH  -EF 15% (3/13)  3) OSA on CPAP 4) DM2  5) H/o noncompliance  6) A/C renal failure due to cardiorenal syndrome 7) Hyperkalemia   Plan/Discussion:    Continues to diurese on low dose milrinone and torsemide.   He had a very impressive response to torsemide yesterday.  Will stop milrinone.  Change torsemide to 60 mg daily.  Potassium trending down.  Will monitor.  Monitor for 24 hours with HA and will stopping milrinone today, and hopefully home tomorrow with hospice.    Can go to telemetry.   [name] 09/08/2011 9:50 AM  Length of Stay: 5

## 2011-09-09 ENCOUNTER — Encounter: Payer: Self-pay | Admitting: Internal Medicine

## 2011-09-09 DIAGNOSIS — I5023 Acute on chronic systolic (congestive) heart failure: Secondary | ICD-10-CM

## 2011-09-09 LAB — BASIC METABOLIC PANEL
CO2: 29 mEq/L (ref 19–32)
Calcium: 9.4 mg/dL (ref 8.4–10.5)
GFR calc non Af Amer: 46 mL/min — ABNORMAL LOW (ref >90)
Glucose, Bld: 128 mg/dL — ABNORMAL HIGH (ref 70–99)
Potassium: 5.6 mEq/L — ABNORMAL HIGH (ref 3.5–5.1)
Sodium: 133 mEq/L — ABNORMAL LOW (ref 135–145)

## 2011-09-09 LAB — GLUCOSE, CAPILLARY: Glucose-Capillary: 131 mg/dL — ABNORMAL HIGH (ref 70–99)

## 2011-09-09 LAB — URIC ACID: Uric Acid, Serum: 10.7 mg/dL — ABNORMAL HIGH (ref 4.0–7.8)

## 2011-09-09 MED ORDER — CARVEDILOL 3.125 MG PO TABS: 3.1250 mg | ORAL_TABLET | Freq: Two times a day (BID) | ORAL | Status: DC

## 2011-09-09 MED ORDER — TORSEMIDE 20 MG PO TABS: 60.0000 mg | ORAL_TABLET | Freq: Every day | ORAL | Status: DC

## 2011-09-09 MED ORDER — BENAZEPRIL HCL 20 MG PO TABS
20.0000 mg | ORAL_TABLET | Freq: Every day | ORAL | Status: DC
  Administered 2011-09-09: 20 mg via ORAL
  Filled 2011-09-09: qty 1

## 2011-09-09 MED ORDER — PREDNISONE 20 MG PO TABS
40.0000 mg | ORAL_TABLET | Freq: Once | ORAL | Status: AC
  Administered 2011-09-09: 40 mg via ORAL
  Filled 2011-09-09: qty 2

## 2011-09-09 NOTE — Discharge Summary (Signed)
Advanced Heart Failure Team  Discharge Summary   Patient ID: [name] MRN: [id number], DOB/AGE: [birth year] 46 y.o. Admit date: 09/03/2011 D/C date:     09/09/2011   Primary Discharge Diagnoses:  1) A/C Systolic HF     -on Hospice  2) NICM, ETOH     -EF 15% (3/13)  3) OSA on CPAP  4) DM2  5) H/o noncompliance  6) A/C renal failure due to cardiorenal syndrome  7) Hyperkalemia  Hospital Course:  [name] is a 46 y/o male with HTN and DM2 with longstanding HF due to NICM (probably ETOH), EF 20%. Cath in 2008 showed left circumflex with 40% stenosis.  He is currently followed by [facility].   The patient was evaluated in the HF clinic for acute decompensation with volume overload, abdominal distention, PND and lower extremity edema that did not respond to outpatient therapy.  He was admitted to the stepdown unit.  ProBNP 3290.  He was started on IV lasix with fair diuresis but it was noted that the patient was also taking in a large amount of fluids.  His renal function declined, Cr 1.57 to 1.76 therefore milrinone support was added to help with cardiac output.  With addition of milrinone and strict fluid restrictions the patient diuresed well.  Total of 27 L and  discharge weight of 214 pounds.  On 8/20 patient was felt to be nearing baseline and IV lasix was transitioned to oral demadex 80 mg daily.  The patient had been on lasix at home but had very good response to demadex in hospital and we will switch to 60mg  daily. We gave him a month's supply. Beta blocker initially held due to acute decompensation but carvedilol 3.125 mg BID restarted on 8/20.    On day of discharge the patient was felt stable for home.  He will continue to be followed by [facility], appreciate their support.  We will see him in HF clinic next week with BMET.   Discharge Weight Range: 214-216 pounds Discharge Vitals: Blood pressure 120/86, pulse 95, temperature 98.4 F (36.9 C), temperature  source Oral, resp. rate 18, height 6' (1.829 m), weight 97.478 kg (214 lb 14.4 oz), SpO2 97.00%.  Labs: Lab Results  Component Value Date   WBC 5.3 09/03/2011   HGB 14.7 09/03/2011   HCT 42.8 09/03/2011   MCV 86.6 09/03/2011   PLT 220 09/03/2011     Lab 09/09/11 0800  NA 133*  K 5.6*  CL 96  CO2 29  BUN 32*  CREATININE 1.74*  CALCIUM 9.4  PROT --  BILITOT --  ALKPHOS --  ALT --  AST --  GLUCOSE 128*   Lab Results  Component Value Date   CHOL 211* 08/24/2010   HDL 39* 08/24/2010   LDLCALC 152* 08/24/2010   TRIG 101 08/24/2010   BNP (last 3 results)  Basename 09/03/11 1502 06/13/11 1115 06/10/11 0544  PROBNP 3290.0* 3799.0* 2537.0*    Diagnostic Studies/Procedures   No results found.  Discharge Medications   Medication List  As of 09/09/2011 11:15 AM   STOP taking these medications         furosemide 80 MG tablet         TAKE these medications         amitriptyline 25 MG tablet   Commonly known as: ELAVIL   Take 25 mg by mouth at bedtime.      benazepril 40 MG tablet   Commonly known  as: LOTENSIN   Take 40 mg by mouth daily.      carvedilol 3.125 MG tablet   Commonly known as: COREG   Take 1 tablet (3.125 mg total) by mouth 2 (two) times daily with a meal. 2 tab in AM and 1 tab in PM      clonazePAM 0.5 MG tablet   Commonly known as: KLONOPIN   Take 0.5 mg by mouth 2 (two) times daily as needed. Anxiety      digoxin 0.25 MG tablet   Commonly known as: LANOXIN   Take 1 tablet (0.25 mg total) by mouth daily.      gabapentin 300 MG capsule   Commonly known as: NEURONTIN   Take 3 capsules (900 mg total) by mouth 3 (three) times daily.      glipiZIDE 10 MG tablet   Commonly known as: GLUCOTROL   Take 1 tablet (10 mg total) by mouth daily.      hydrALAZINE 10 MG tablet   Commonly known as: APRESOLINE   Take 10 mg by mouth 3 (three) times daily.      insulin glargine 100 UNIT/ML injection   Commonly known as: LANTUS   Inject 15 Units into the skin at  bedtime.      omeprazole 20 MG capsule   Commonly known as: PRILOSEC   Take 20 mg by mouth daily.      oxyCODONE-acetaminophen 5-325 MG per tablet   Commonly known as: PERCOCET/ROXICET   Take 1 tablet by mouth every 4 (four) hours as needed. For pain      rosuvastatin 10 MG tablet   Commonly known as: CRESTOR   Take 1 tablet (10 mg total) by mouth daily.      torsemide 20 MG tablet   Commonly known as: DEMADEX   Take 3 tablets (60 mg total) by mouth daily.      zolpidem 5 MG tablet   Commonly known as: AMBIEN   Take 5 mg by mouth at bedtime.            Disposition   The patient will be discharged in stable condition to home. Discharge Orders    Future Appointments: Provider: Department: Dept Phone: Center:   09/15/2011 2:45 PM Almyra Deforest, MD [facility] [phone] [facility]   09/16/2011 2:00 PM [facility] [phone] None     Future Orders Please Complete By Expires   Diet - low sodium heart healthy      Increase activity slowly      STOP any activity that causes chest pain, shortness of breath, dizziness, sweating, or exessive weakness      ACE Inhibitor / ARB already ordered        Follow-up Information    Follow up with Arvilla Meres, MD on 09/16/2011. (at 2 pm  [employer] Code [id number]))    Contact information:   [address] [place] [address] [phone]            Duration of Discharge Encounter: Greater than 35 minutes   Signed, Arvilla Meres, MD 11:15 AM

## 2011-09-09 NOTE — Progress Notes (Signed)
[facility]) Homecare Chaplain  Pt: [name]  Rm: [phone]  Homecare chaplain visited again to check in with pt concerning his report to chaplain of pain in earlier visit.  Chaplain waited to check back in to give time for meds just administered by RN as chaplain visited pt about an hour and a half ago, to take effect.  Pt still reported his pain at a 8 out of 10 as chaplain checked in with him.  Chaplain talked with RN about this, as pt was packing due to being told he was being discharged, and reported pt's statement of pain.  Chaplain will follow pt as he goes home and will continue to provide spiritual support.  Beverly Isley-Landreth  ThM, BCCC [facility] Clinical Chaplain

## 2011-09-09 NOTE — Progress Notes (Signed)
I cosign for Melvyn Novas, RN assessment, notes, and med administration.

## 2011-09-09 NOTE — Progress Notes (Signed)
[facility])  Homecare Chaplain visit   Pt (patient): [name]  Rm: [phone]  Chaplain visited to assess for spiritual needs.  Pt insistent that his pain is a 10 out of 10, asking chaplain to see if the hospice RN could help him.  Pt stated he cannot sleep at night due to combination of worry and pain.  Pt indicated he continues to be concerned about his family, and indicated he feels very alone.  During chaplain's visit, pt saw an acquaintance out on the floor and called him in to visit as well, asking him to come back to check on him.  Pt at times appeared drowsy, but then appeared to have a wave of pain (per pt report), followed by heightened anxiety.  Chaplain watched this process cycle three times during visit.  Chaplain provided active listening, normalizing feelings, and providing pastoral presence, and joining pt and friend in prayer.  Chaplain will follow.  Beverly Isley-Landreth ThM, BCCC [facility] Clinical Chaplain

## 2011-09-09 NOTE — Progress Notes (Signed)
Patient discharged, taken off unit via wheelchair and this RN. Patient admitted to RN that he had been taking personal supply of pain medication in addition to hospital pain medication, states this was why he was "so tired."

## 2011-09-09 NOTE — Progress Notes (Signed)
Room 4703 - [name] -  [facility] RN Visit  Related admission to [facility] diagnosis of ETOH Cardiomyopathy.   Pt is DNR code.    Pt appears drowsy, eyes closing before he can complete a sentence.   No family present at first visit.  Pt agitated, states he is in pain and MD did not leave any prescriptions for his discharge today.   Left room to check with staff RN.  Discharge summary from Dr. Teressa Lower states pt is to continue his home med Percocet.  Returned to room and discussed pain meds with patient and now family members present was his son, daughter and her friend and patient's former girlfriend.  Pt states he has Percocet bottle with him from home then states he gave it to his wife.  Per [facility], pt received refil of 60 tablets on 08/26/2011 which was a 5 day supply (taking 1-2 tabs q/4 hrs).  Pt states he had 3 tablets left in the  Bottle.  Advised pt to call pharmacy when he gets home and ask for refil - the pharmacy will call [facility] and we will get the refil for him.  Pt states only DME he needs is a flow meter for his 02 tanks at home (prior flow meter was returned with tanks when he changed living arrangements several weeks ago).  [facility] DME called to order Flow meter for pt.   Pt left hospital with his aunt via private auto approximately 1:30pm.   All discharge info faxed to [facility].  Please call [facility] @ [phone] with any hospice needs.  Thank you.  Joneen Boers, RN  [facility]  Hospice Liaison

## 2011-09-09 NOTE — Progress Notes (Signed)
Notified by chaplain regarding pt complaint of pain not resolved. In to see pt, pt states is only concerned about receiving a prescription for home pain medications. Pt drowsy, falling asleep repeatedly during interaction. VSS. Patient refused tramadol.

## 2011-09-10 ENCOUNTER — Other Ambulatory Visit (HOSPITAL_COMMUNITY): Payer: Self-pay

## 2011-09-10 ENCOUNTER — Other Ambulatory Visit: Payer: Self-pay | Admitting: *Deleted

## 2011-09-10 DIAGNOSIS — E119 Type 2 diabetes mellitus without complications: Secondary | ICD-10-CM

## 2011-09-10 DIAGNOSIS — G629 Polyneuropathy, unspecified: Secondary | ICD-10-CM

## 2011-09-10 MED ORDER — OMEPRAZOLE 20 MG PO CPDR: 20.0000 mg | DELAYED_RELEASE_CAPSULE | Freq: Every day | ORAL | Status: DC

## 2011-09-10 MED ORDER — GABAPENTIN 300 MG PO CAPS
600.0000 mg | ORAL_CAPSULE | Freq: Three times a day (TID) | ORAL | Status: DC
Start: 2011-09-10 — End: 2012-03-22

## 2011-09-10 MED ORDER — AMITRIPTYLINE HCL 25 MG PO TABS: 25.0000 mg | ORAL_TABLET | Freq: Every day | ORAL | Status: DC

## 2011-09-10 MED ORDER — INSULIN SYRINGES (DISPOSABLE) U-100 0.3 ML MISC: 15.0000 [IU] | Freq: Every day | Status: DC

## 2011-09-10 MED ORDER — GLIPIZIDE 10 MG PO TABS: 10.0000 mg | ORAL_TABLET | Freq: Every day | ORAL | Status: DC

## 2011-09-10 MED ORDER — SENNOSIDES 8.6 MG PO TABS: 1.0000 | ORAL_TABLET | Freq: Every day | ORAL | Status: DC

## 2011-09-10 MED ORDER — CLONAZEPAM 0.5 MG PO TABS: 0.5000 mg | ORAL_TABLET | Freq: Two times a day (BID) | ORAL | Status: DC | PRN

## 2011-09-10 MED ORDER — INSULIN GLARGINE 100 UNIT/ML ~~LOC~~ SOLN: 15.0000 [IU] | Freq: Every day | SUBCUTANEOUS | Status: DC

## 2011-09-10 NOTE — Telephone Encounter (Signed)
Called to pharm and hospice nurse notified

## 2011-09-10 NOTE — Telephone Encounter (Signed)
Could these be filled today per hospice

## 2011-09-13 ENCOUNTER — Encounter: Payer: Self-pay | Admitting: Internal Medicine

## 2011-09-13 ENCOUNTER — Telehealth (HOSPITAL_COMMUNITY): Payer: Self-pay | Admitting: *Deleted

## 2011-09-13 NOTE — Telephone Encounter (Signed)
09/13/11:Linda ( Barschefski, RN)with Hospice contacted for verification.  She stated pt will be picking up meds prepared for him and this medicine may be in there. She will see pt again on Thursday and update if medication needed.

## 2011-09-13 NOTE — Telephone Encounter (Signed)
Following message received on 09/13/11 at 1327: [name] with Hospice left VM 09/10/11 on behalf of of patient. States he had been discharged from hospital the night before and needed a refill on his Klonopin called in on Monday 09/13/11. Requested a call to let her know it had been done. Per EPIC, RX for this medicine was written and printed on 09/10/11 by Almyra Deforest, MD.

## 2011-09-15 ENCOUNTER — Ambulatory Visit (INDEPENDENT_AMBULATORY_CARE_PROVIDER_SITE_OTHER): Payer: Medicare Other | Admitting: Internal Medicine

## 2011-09-15 ENCOUNTER — Encounter: Payer: Self-pay | Admitting: Internal Medicine

## 2011-09-15 VITALS — BP 129/82 | HR 100 | Temp 98.1°F | Ht 72.0 in | Wt 223.3 lb

## 2011-09-15 DIAGNOSIS — E785 Hyperlipidemia, unspecified: Secondary | ICD-10-CM

## 2011-09-15 DIAGNOSIS — R7989 Other specified abnormal findings of blood chemistry: Secondary | ICD-10-CM

## 2011-09-15 DIAGNOSIS — I5022 Chronic systolic (congestive) heart failure: Secondary | ICD-10-CM

## 2011-09-15 DIAGNOSIS — I10 Essential (primary) hypertension: Secondary | ICD-10-CM

## 2011-09-15 DIAGNOSIS — F329 Major depressive disorder, single episode, unspecified: Secondary | ICD-10-CM

## 2011-09-15 DIAGNOSIS — G4733 Obstructive sleep apnea (adult) (pediatric): Secondary | ICD-10-CM

## 2011-09-15 DIAGNOSIS — E790 Hyperuricemia without signs of inflammatory arthritis and tophaceous disease: Secondary | ICD-10-CM | POA: Insufficient documentation

## 2011-09-15 DIAGNOSIS — M549 Dorsalgia, unspecified: Secondary | ICD-10-CM

## 2011-09-15 DIAGNOSIS — G4700 Insomnia, unspecified: Secondary | ICD-10-CM

## 2011-09-15 DIAGNOSIS — Z23 Encounter for immunization: Secondary | ICD-10-CM | POA: Insufficient documentation

## 2011-09-15 DIAGNOSIS — E119 Type 2 diabetes mellitus without complications: Secondary | ICD-10-CM

## 2011-09-15 LAB — GLUCOSE, CAPILLARY: Glucose-Capillary: 130 mg/dL — ABNORMAL HIGH (ref 70–99)

## 2011-09-15 MED ORDER — ZOLPIDEM TARTRATE 5 MG PO TABS: 5.0000 mg | ORAL_TABLET | Freq: Every day | ORAL | Status: DC

## 2011-09-15 NOTE — Assessment & Plan Note (Signed)
TDap given today. 

## 2011-09-15 NOTE — Assessment & Plan Note (Signed)
Patient noted to have elevated Uric acid of 10.7 . Patient noted he occasionally has right knee swelling but it occurs whenever he was fluid overloaded. At this point I will continue to monitor. If patient becomes symptomatic I will consider to start treatment.
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
## 2011-09-15 NOTE — Assessment & Plan Note (Signed)
Patient reports to be more compliant with CPAP at this point. I informed that he needs to use at least 4 hours a day to qualify  For CPAP. I noted that he will no longer stop using it since he feels like a new men.

## 2011-09-15 NOTE — Assessment & Plan Note (Signed)
Blood pressure well controlled. BP Readings from Last 3 Encounters:  09/15/11 129/82  09/09/11 128/84  09/03/11 122/82

## 2011-09-15 NOTE — Assessment & Plan Note (Addendum)
Currently stable. Weight seems to be up compared to 8/22 ( Day of Hospital discharge) but patient was wearing boots, keys, cell phones, wallet . No edema noted on physical exam. Patient underlined that he has cut down drastically on fluid and salt intake. Will follow up with CHF clinic tomorrow.

## 2011-09-15 NOTE — Assessment & Plan Note (Signed)
Hgb A1c 8.2 two  weeks ago. Currently patient is on Glipizide and Lantus 15 units daily. At this point I am not aiming a strict control of his Diabetes. Managing CHF has at this point priority.  Recommended to check CBG daily.  Will refer patient for Ophthalmology.

## 2011-09-15 NOTE — Assessment & Plan Note (Signed)
I will obtain Lipid panel today.

## 2011-09-16 ENCOUNTER — Ambulatory Visit (HOSPITAL_COMMUNITY)
Admit: 2011-09-16 | Discharge: 2011-09-16 | Disposition: A | Discharge status: Home / Self Care | Payer: Medicare Other | Attending: Internal Medicine | Admitting: Internal Medicine

## 2011-09-16 VITALS — BP 132/80 | HR 110 | Wt 218.5 lb

## 2011-09-16 DIAGNOSIS — N189 Chronic kidney disease, unspecified: Secondary | ICD-10-CM

## 2011-09-16 DIAGNOSIS — I129 Hypertensive chronic kidney disease with stage 1 through stage 4 chronic kidney disease, or unspecified chronic kidney disease: Secondary | ICD-10-CM | POA: Insufficient documentation

## 2011-09-16 DIAGNOSIS — G4733 Obstructive sleep apnea (adult) (pediatric): Secondary | ICD-10-CM | POA: Insufficient documentation

## 2011-09-16 DIAGNOSIS — I5022 Chronic systolic (congestive) heart failure: Secondary | ICD-10-CM

## 2011-09-16 DIAGNOSIS — I10 Essential (primary) hypertension: Secondary | ICD-10-CM

## 2011-09-16 LAB — LIPID PANEL
HDL: 47 mg/dL (ref >39)
LDL Cholesterol: 94 mg/dL (ref 0–99)
Triglycerides: 124 mg/dL (ref <150)

## 2011-09-16 LAB — BASIC METABOLIC PANEL
BUN: 22 mg/dL (ref 6–23)
Calcium: 9 mg/dL (ref 8.4–10.5)
Chloride: 104 mEq/L (ref 96–112)
Creat: 1.97 mg/dL — ABNORMAL HIGH (ref 0.50–1.35)

## 2011-09-16 MED ORDER — CARVEDILOL 3.125 MG PO TABS: 6.2500 mg | ORAL_TABLET | Freq: Two times a day (BID) | ORAL | Status: DC

## 2011-09-16 NOTE — Progress Notes (Signed)
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 Cath in 2008 showed left circumflex with 40% stenosis.   He has had multiple admissions for HF over the past year.   Admitted 03/2011 with NYHA Class IV symptoms, requiring milrinone therapy and IV lasix. Cath 04/13/11: cardiogenic shock with biventricular failure (R>L) and possible restrictive physiology. RA = 22 with steep y-descents, RV = 45/12/25, PA = 51/25 (34), PCW = 18-20, Fick cardiac output/index = 3.1/1.4, Thermo CO/CI = 3.6/1.6, PVR = 4.8 [place] O2 sat = 85% (after sedation), PA sat = 31%, 36%. Milrinone was initiated for low output physiology. [name] was left in RIJ for hemodynamic monitoring and he was transferred to the CCU. Echo was done to re-evaluate RV function this showed LVEF 15%. RV moderately dilated with mildly reduced systolic function. RA and LA mildly dilated. PAPP 52 mmHG.LE dopplers ordered (negative for DVT). Digoxin, Ace-I and beta blocker started. Diuresed 33 liters. Discharge weight 214 pounds.   [name] stopped due to hyperkalemia.   D/C from [facility] 06/13/11 due to pneumonia.   Admitted 8/16 with massive volume overload.  ProBNP 3290.  Diuresed 27 liters with IV lasix.  Discharge weight 214 pounds.    Returns for post hospital follow up today.  He is doing great.  Feels the best he has in years.  He is wearing CPAP nightly and feels more refreshed.  His weight is stable.  He is walking his 6th grader 2 blocks to the bus stop before he gets dyspneic.  He denies orthopnea/PND.  Compliant with medications.  Watching low sodium diet and fluid intake.    [facility] following.     ROS: All systems negative except as listed in HPI, PMH and Problem List.  Past Medical History  Diagnosis Date  . Hypertension   . 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 History of  peptic ulcer disease     EGD 09/2007 shows prox gastric ulcer with black eschar, treated with PPI  . Depression   . History of alcohol abuse     quit approx 2011-2012  . Angina   . Shortness of breath   . OSA (obstructive sleep apnea)     sleeps with oxygen  . Cardiomyopathy   . ICD (implantable cardiac defibrillator) in place   . Diabetes mellitus     insulin dependent  . GERD (gastroesophageal reflux disease)     Current Outpatient Prescriptions  Medication Sig Dispense Refill  . benazepril (LOTENSIN) 40 MG tablet Take 40 mg by mouth daily.      . carvedilol (COREG) 3.125 MG tablet Take 1 tablet (3.125 mg total) by mouth 2 (two) times daily with a meal. 2 tab in AM and 1 tab in PM      . clonazePAM (KLONOPIN) 0.5 MG tablet Take 0.5 mg by mouth 2 (two) times daily as needed. Anxiety Prescribed by Dr Donell Beers.      . digoxin (LANOXIN) 0.25 MG tablet Take 1 tablet (0.25 mg total) by mouth daily.  30 tablet  6  . DULoxetine (CYMBALTA) 60 MG capsule Take 60 mg by mouth daily. Prescribed by Dr Donell Beers.      Marland Kitchen gabapentin (NEURONTIN) 300 MG capsule Take 2 capsules (600 mg total) by mouth 3 (three) times daily.  60 capsule  3  .  glipiZIDE (GLUCOTROL) 10 MG tablet Take 1 tablet (10 mg total) by mouth daily.  30 tablet  3  . hydrALAZINE (APRESOLINE) 10 MG tablet Take 10 mg by mouth 3 (three) times daily.      . insulin glargine (LANTUS) 100 UNIT/ML injection Inject 15 Units into the skin at bedtime.  10 mL  3  . Insulin Syringes, Disposable, U-100 0.3 ML MISC 15 Units by Does not apply route at bedtime.  100 each  3  . omeprazole (PRILOSEC) 20 MG capsule Take 1 capsule (20 mg total) by mouth daily.  30 capsule  0  . oxyCODONE-acetaminophen (PERCOCET/ROXICET) 5-325 MG per tablet Take 1 tablet by mouth every 4 (four) hours as needed. For pain Prescribed by Hospice.      . rosuvastatin (CRESTOR) 10 MG tablet Take 1 tablet (10 mg total) by mouth daily.  30 tablet  3  . senna (SENOKOT) 8.6 MG tablet  Take 1 tablet (8.6 mg total) by mouth daily. Take 1 to 5 tablets by mouth twice daily as needed  240 tablet  3  . torsemide (DEMADEX) 20 MG tablet Take 3 tablets (60 mg total) by mouth daily.  90 tablet  6  . zolpidem (AMBIEN) 5 MG tablet Take 1 tablet (5 mg total) by mouth at bedtime.  30 tablet  0  . DISCONTD: metoprolol tartrate (LOPRESSOR) 25 MG tablet Take 1 tablet (25 mg total) by mouth 2 (two) times daily.  60 tablet  1  . DISCONTD: traZODone (DESYREL) 25 mg TABS Take 0.5 tablets (25 mg total) by mouth at bedtime.  30 tablet  1     PHYSICAL EXAM: Filed Vitals:   09/16/11 1355  BP: 132/80  Pulse: 110  Weight: 218 lb 8 oz (99.111 kg)  SpO2: 100%    General:  Polite, well developed.  No resp difficulty HEENT: normal Neck: supple. JVP 5-6 Carotids 2+ bilaterally; no bruits. No lymphadenopathy or thryomegaly appreciated. Cor: PMI normal. Regular rate & rhythm. 2/6 MR Lungs: clear Abdomen: soft, nontender, +distended. No hepatosplenomegaly. No bruits or masses. Good bowel sounds. Extremities: no cyanosis, clubbing, rash, edema Neuro: alert & orientedx3, cranial nerves grossly intact. Moves all 4 extremities w/o difficulty. Affect pleasant.     ASSESSMENT & PLAN:

## 2011-09-16 NOTE — Assessment & Plan Note (Signed)
Encouraged him to continue CPAP use.

## 2011-09-16 NOTE — Patient Instructions (Addendum)
Increase carvedilol 6.25 mg (2 tabs) twice daily.  Follow up 2 weeks.

## 2011-09-16 NOTE — Assessment & Plan Note (Signed)
As abvoe, increase carvedilol.

## 2011-09-16 NOTE — Assessment & Plan Note (Signed)
Cr stable.  Continue follow.

## 2011-09-16 NOTE — Assessment & Plan Note (Signed)
Mr. Horsey looks the best I have seen him.  Volume status stable.  NYHA III.  Will titrate carvedilol 6.25 mg BID.  Continue demadex.  Encouraged him to continue low sodium diet and fluid restrictions.  Labs stable from PCP yesterday.  Follow up closely in HF clinic for further med titration.

## 2011-09-22 ENCOUNTER — Encounter (HOSPITAL_COMMUNITY): Payer: Self-pay | Admitting: Psychiatry

## 2011-09-22 ENCOUNTER — Ambulatory Visit (INDEPENDENT_AMBULATORY_CARE_PROVIDER_SITE_OTHER): Admitting: Psychiatry

## 2011-09-22 ENCOUNTER — Encounter: Payer: Self-pay | Admitting: *Deleted

## 2011-09-22 DIAGNOSIS — F339 Major depressive disorder, recurrent, unspecified: Secondary | ICD-10-CM

## 2011-09-22 DIAGNOSIS — F332 Major depressive disorder, recurrent severe without psychotic features: Secondary | ICD-10-CM

## 2011-09-22 MED ORDER — CLORAZEPATE DIPOTASSIUM 7.5 MG PO TABS: ORAL_TABLET | ORAL | Status: DC

## 2011-09-22 NOTE — Progress Notes (Signed)
[facility] MD Progress Note  09/22/2011 3:22 PM More social disasters. The patient is drug addicted wife and their 3 children were all moved out of his father loss home. They then were transferred to a hotel with a lid for 2 weeks and now are living in the range duplex. The patient is on hospice, has a case worker and presently is dependent upon their disability checks which are being held up as they are misplaced in the mailing system. Apparently his father-in-law sent back all the checks in all the information for food stamps. These things are all getting reestablished. Since his last visit this patient has been hospitalized medically for congestive heart failure. He unfortunately never got to see the therapist and no longer takes Cymbalta. He took Klonopin but claims that it seems to keep him up more than anything. He describes persistent crisis anxiety and depression. Hospice is active on her care and is helping him with housing. This patient is barely keeping his world together. He is dependent upon social assistance. He is not even clear if his psychiatric medications will be paid for by hospice. I suspect that they would pay for his antidepressants and his antianxiety medications. Over the next one month the patient's financial situation will be clarified. Diagnosis:  Axis I: Major Depression, Recurrent severe  ADL's:  Intact  Sleep: Fair  Appetite:  Good  Suicidal Ideation: none Homicidal Ideation: none   AEB (as evidenced by):  Mental Status Examination/Evaluation: Objective:  Appearance: Disheveled  Eye Contact::  Fair  Speech:  Clear and Coherent  Volume:  Normal  Mood:  Anxious  Affect:  Appropriate  Thought Process:  Coherent  Orientation:  Full  Thought Content:  WDL  Suicidal Thoughts:  no  Homicidal Thoughts:  No    Judgement:  Good  Insight:  Good  Psychomotor Activity:  Normal  Concentration:  Good  Recall:  Good  Akathisia:  no  Handed:  Right  AIMS (if indicated):       Assets:  Communication Skills  Sleep:      Vital Signs:There were no vitals taken for this visit. Current Medications: Current Outpatient Prescriptions  Medication Sig Dispense Refill  . benazepril (LOTENSIN) 40 MG tablet Take 40 mg by mouth daily.      . carvedilol (COREG) 3.125 MG tablet Take 2 tablets (6.25 mg total) by mouth 2 (two) times daily with a meal.      . clonazePAM (KLONOPIN) 0.5 MG tablet Take 0.5 mg by mouth 2 (two) times daily as needed. Anxiety Prescribed by Dr Donell Beers.      . clorazepate (TRANXENE) 7.5 MG tablet 1 BID for 4 days then 1 am 2qhs  90 tablet  1  . digoxin (LANOXIN) 0.25 MG tablet Take 1 tablet (0.25 mg total) by mouth daily.  30 tablet  6  . gabapentin (NEURONTIN) 300 MG capsule Take 2 capsules (600 mg total) by mouth 3 (three) times daily.  60 capsule  3  . glipiZIDE (GLUCOTROL) 10 MG tablet Take 1 tablet (10 mg total) by mouth daily.  30 tablet  3  . hydrALAZINE (APRESOLINE) 10 MG tablet Take 10 mg by mouth 3 (three) times daily.      . insulin glargine (LANTUS) 100 UNIT/ML injection Inject 15 Units into the skin at bedtime.  10 mL  3  . Insulin Syringes, Disposable, U-100 0.3 ML MISC 15 Units by Does not apply route at bedtime.  100 each  3  . omeprazole (PRILOSEC)  20 MG capsule Take 1 capsule (20 mg total) by mouth daily.  30 capsule  0  . oxyCODONE-acetaminophen (PERCOCET/ROXICET) 5-325 MG per tablet Take 1 tablet by mouth every 4 (four) hours as needed. For pain Prescribed by Hospice.      . rosuvastatin (CRESTOR) 10 MG tablet Take 1 tablet (10 mg total) by mouth daily.  30 tablet  3  . senna (SENOKOT) 8.6 MG tablet Take 1 tablet (8.6 mg total) by mouth daily. Take 1 to 5 tablets by mouth twice daily as needed  240 tablet  3  . torsemide (DEMADEX) 20 MG tablet Take 3 tablets (60 mg total) by mouth daily.  90 tablet  6  . zolpidem (AMBIEN) 5 MG tablet Take 1 tablet (5 mg total) by mouth at bedtime.  30 tablet  0  . DISCONTD: metoprolol tartrate  (LOPRESSOR) 25 MG tablet Take 1 tablet (25 mg total) by mouth 2 (two) times daily.  60 tablet  1  . DISCONTD: traZODone (DESYREL) 25 mg TABS Take 0.5 tablets (25 mg total) by mouth at bedtime.  30 tablet  1    Lab Results: No results found for this or any previous visit (from the past 48 hour(s)).  Physical Findings: AIMS:  , ,  ,  ,    CIWA:    COWS:     Treatment Plan Summary: Medication managementat this time we will go ahead and begin him on Tranxene 7.5 mg. He should take it 1 twice a day for 4 days and then increase to one in the morning and 2 at night. I will give him one refill and a return appointment to see me in one month. At that time we will reevaluate his anxiety/depression.  Plan:  [name] 09/22/2011, 3:22 PM

## 2011-09-24 ENCOUNTER — Other Ambulatory Visit: Payer: Self-pay | Admitting: *Deleted

## 2011-09-24 DIAGNOSIS — E119 Type 2 diabetes mellitus without complications: Secondary | ICD-10-CM

## 2011-09-24 MED ORDER — "INSULIN SYRINGE 31G X 5/16"" 0.5 ML MISC": Status: DC

## 2011-09-29 ENCOUNTER — Ambulatory Visit (HOSPITAL_COMMUNITY): Payer: Medicare Other | Attending: Internal Medicine

## 2011-09-29 DIAGNOSIS — I426 Alcoholic cardiomyopathy: Secondary | ICD-10-CM

## 2011-10-05 ENCOUNTER — Emergency Department (HOSPITAL_COMMUNITY)

## 2011-10-05 ENCOUNTER — Encounter (HOSPITAL_COMMUNITY): Payer: Self-pay

## 2011-10-05 ENCOUNTER — Inpatient Hospital Stay (HOSPITAL_COMMUNITY)
Admission: EM | Admit: 2011-10-05 | Discharge: 2011-10-19 | DRG: 291 | Disposition: A | Discharge status: Hospice | Attending: Internal Medicine | Admitting: Internal Medicine

## 2011-10-05 DIAGNOSIS — K219 Gastro-esophageal reflux disease without esophagitis: Secondary | ICD-10-CM | POA: Diagnosis present

## 2011-10-05 DIAGNOSIS — Z515 Encounter for palliative care: Secondary | ICD-10-CM

## 2011-10-05 DIAGNOSIS — I509 Heart failure, unspecified: Secondary | ICD-10-CM | POA: Diagnosis present

## 2011-10-05 DIAGNOSIS — G7001 Myasthenia gravis with (acute) exacerbation: Secondary | ICD-10-CM | POA: Diagnosis not present

## 2011-10-05 DIAGNOSIS — G4733 Obstructive sleep apnea (adult) (pediatric): Secondary | ICD-10-CM | POA: Diagnosis present

## 2011-10-05 DIAGNOSIS — Z9119 Patient's noncompliance with other medical treatment and regimen: Secondary | ICD-10-CM

## 2011-10-05 DIAGNOSIS — E119 Type 2 diabetes mellitus without complications: Secondary | ICD-10-CM | POA: Diagnosis present

## 2011-10-05 DIAGNOSIS — G7000 Myasthenia gravis without (acute) exacerbation: Secondary | ICD-10-CM | POA: Diagnosis present

## 2011-10-05 DIAGNOSIS — Y9210 Unspecified residential institution as the place of occurrence of the external cause: Secondary | ICD-10-CM | POA: Diagnosis not present

## 2011-10-05 DIAGNOSIS — H5316 Psychophysical visual disturbances: Secondary | ICD-10-CM | POA: Diagnosis not present

## 2011-10-05 DIAGNOSIS — H538 Other visual disturbances: Secondary | ICD-10-CM

## 2011-10-05 DIAGNOSIS — E875 Hyperkalemia: Secondary | ICD-10-CM | POA: Diagnosis not present

## 2011-10-05 DIAGNOSIS — J9600 Acute respiratory failure, unspecified whether with hypoxia or hypercapnia: Secondary | ICD-10-CM

## 2011-10-05 DIAGNOSIS — T424X5A Adverse effect of benzodiazepines, initial encounter: Secondary | ICD-10-CM | POA: Diagnosis not present

## 2011-10-05 DIAGNOSIS — F1011 Alcohol abuse, in remission: Secondary | ICD-10-CM | POA: Diagnosis present

## 2011-10-05 DIAGNOSIS — H532 Diplopia: Secondary | ICD-10-CM | POA: Diagnosis present

## 2011-10-05 DIAGNOSIS — G629 Polyneuropathy, unspecified: Secondary | ICD-10-CM

## 2011-10-05 DIAGNOSIS — R29810 Facial weakness: Secondary | ICD-10-CM | POA: Diagnosis present

## 2011-10-05 DIAGNOSIS — I5023 Acute on chronic systolic (congestive) heart failure: Secondary | ICD-10-CM | POA: Diagnosis present

## 2011-10-05 DIAGNOSIS — N179 Acute kidney failure, unspecified: Secondary | ICD-10-CM | POA: Diagnosis present

## 2011-10-05 DIAGNOSIS — Z9109 Other allergy status, other than to drugs and biological substances: Secondary | ICD-10-CM

## 2011-10-05 DIAGNOSIS — Z794 Long term (current) use of insulin: Secondary | ICD-10-CM

## 2011-10-05 DIAGNOSIS — F329 Major depressive disorder, single episode, unspecified: Secondary | ICD-10-CM | POA: Diagnosis present

## 2011-10-05 DIAGNOSIS — Z91199 Patient's noncompliance with other medical treatment and regimen due to unspecified reason: Secondary | ICD-10-CM

## 2011-10-05 DIAGNOSIS — Z95810 Presence of automatic (implantable) cardiac defibrillator: Secondary | ICD-10-CM

## 2011-10-05 DIAGNOSIS — E785 Hyperlipidemia, unspecified: Secondary | ICD-10-CM

## 2011-10-05 DIAGNOSIS — Z91013 Allergy to seafood: Secondary | ICD-10-CM

## 2011-10-05 DIAGNOSIS — N189 Chronic kidney disease, unspecified: Secondary | ICD-10-CM | POA: Diagnosis present

## 2011-10-05 DIAGNOSIS — Z888 Allergy status to other drugs, medicaments and biological substances status: Secondary | ICD-10-CM

## 2011-10-05 DIAGNOSIS — I426 Alcoholic cardiomyopathy: Secondary | ICD-10-CM | POA: Diagnosis present

## 2011-10-05 DIAGNOSIS — I130 Hypertensive heart and chronic kidney disease with heart failure and stage 1 through stage 4 chronic kidney disease, or unspecified chronic kidney disease: Principal | ICD-10-CM | POA: Diagnosis present

## 2011-10-05 HISTORY — DX: Headache: R51

## 2011-10-05 LAB — COMPREHENSIVE METABOLIC PANEL
ALT: 19 U/L (ref 0–53)
AST: 23 U/L (ref 0–37)
Alkaline Phosphatase: 73 U/L (ref 39–117)
CO2: 25 mEq/L (ref 19–32)
Calcium: 9.8 mg/dL (ref 8.4–10.5)
Chloride: 102 mEq/L (ref 96–112)
GFR calc Af Amer: 54 mL/min — ABNORMAL LOW (ref >90)
GFR calc non Af Amer: 47 mL/min — ABNORMAL LOW (ref >90)
Glucose, Bld: 134 mg/dL — ABNORMAL HIGH (ref 70–99)
Sodium: 139 mEq/L (ref 135–145)
Total Bilirubin: 1.7 mg/dL — ABNORMAL HIGH (ref 0.3–1.2)

## 2011-10-05 LAB — CBC WITH DIFFERENTIAL/PLATELET
Eosinophils Relative: 7 % — ABNORMAL HIGH (ref 0–5)
HCT: 43.3 % (ref 39.0–52.0)
Lymphocytes Relative: 23 % (ref 12–46)
Lymphs Abs: 1.7 10*3/uL (ref 0.7–4.0)
MCV: 84.6 fL (ref 78.0–100.0)
Neutro Abs: 4.8 10*3/uL (ref 1.7–7.7)
Platelets: 210 10*3/uL (ref 150–400)
RBC: 5.12 MIL/uL (ref 4.22–5.81)
WBC: 7.5 10*3/uL (ref 4.0–10.5)

## 2011-10-05 MED ORDER — FUROSEMIDE 10 MG/ML IJ SOLN
80.0000 mg | Freq: Once | INTRAMUSCULAR | Status: AC
  Administered 2011-10-05: 80 mg via INTRAVENOUS
  Filled 2011-10-05: qty 8

## 2011-10-05 MED ORDER — HYDROMORPHONE HCL PF 1 MG/ML IJ SOLN
1.0000 mg | INTRAMUSCULAR | Status: AC
  Administered 2011-10-05: 1 mg via INTRAVENOUS
  Filled 2011-10-05: qty 1

## 2011-10-05 NOTE — ED Notes (Addendum)
Per Hospice RN Shanda Bumps, pt reported to her earlier today he didn't feel like living like this anymore. Pt denies SI, but does sts to this RN, "I"m tired of living like this, back and forth to the drs and here."  Hospice RN Shanda Bumps [phone]

## 2011-10-05 NOTE — ED Notes (Signed)
Pt arrives upset, difficult to communicate with, upset about meds not in room with him, meds given to pt. Presents volatile and sensitive. Plan process wait explained to pt with rationale.

## 2011-10-05 NOTE — ED Notes (Signed)
Pt transported to ED by [facility]

## 2011-10-05 NOTE — ED Notes (Signed)
Respiratory at bedside to place pt on bipap

## 2011-10-05 NOTE — ED Provider Notes (Signed)
History     CSN: [id number]  Arrival date & time 10/05/11  2952   First MD Initiated Contact with Patient 10/05/11 2258      Chief Complaint  Patient presents with  . Chest Pain    (Consider location/radiation/quality/duration/timing/severity/associated sxs/prior treatment) HPIJames L [name] is a 46 y.o. male with a history of dilated cardiomyopathy likely secondary to alcoholism presents to the ER with shortness of breath and increased weight of 15 pounds over the last 2-3 days. Patient's cardiologist is Dr. Augustina Mood. Patient does have home hospice which monitors pain medicine. He is having no chest pain right now but he is short of breath and has increased pedal edema as well. Shortness of breath is moderate to severe, to the constant and worsening over the last 2-3 days. He says the new diuretic which she has been put on is not working as effectively as Lasix was previously. He denies any fevers, chills, nausea vomiting or diarrhea.  Patient wants to be full code.  Past Medical History  Diagnosis Date  . Hypertension   . 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 History of peptic ulcer disease     EGD 09/2007 shows prox gastric ulcer with black eschar, treated with PPI  . Depression   . History of alcohol abuse     quit approx 2011-2012  . Angina   . Shortness of breath   . OSA (obstructive sleep apnea)     sleeps with oxygen  . Cardiomyopathy   . ICD (implantable cardiac defibrillator) in place   . Diabetes mellitus     insulin dependent  . GERD (gastroesophageal reflux disease)     Past Surgical History  Procedure Date  . Cardiac defibrillator placement 04/2007  . Knee surgery   . Skin grafts   . Facial reconstruction surgery     Family History  Problem Relation Age of Onset  . Heart failure Mother   . Hypertension Mother   . Sarcoidosis Mother   . Diabetes type II  Mother   . Heart attack Brother     History  Substance Use Topics  . Smoking status: Never Smoker   . Smokeless tobacco: Never Used  . Alcohol Use: No     no alcohol since 2012      Review of Systems At least 10pt or greater review of systems completed and are negative except where specified in the HPI.  Allergies  Shellfish allergy; Adhesive; and Zoloft  Home Medications   Current Outpatient Rx  Name Route Sig Dispense Refill  . BENAZEPRIL HCL 40 MG PO TABS Oral Take 40 mg by mouth daily.    Marland Kitchen CARVEDILOL 3.125 MG PO TABS Oral Take 2 tablets (6.25 mg total) by mouth 2 (two) times daily with a meal.    . CLORAZEPATE DIPOTASSIUM 7.5 MG PO TABS Oral Take 7.5-15 mg by mouth 2 (two) times daily. Take 1 tablet every morning and take 2 tablets at bedtime    . DIAZEPAM 10 MG PO TABS Oral Take 10 mg by mouth every 6 (six) hours as needed. For anxiety    . DIGOXIN 0.25 MG PO TABS Oral Take 1 tablet (0.25 mg total) by mouth daily. 30 tablet 6  . GABAPENTIN 300 MG PO CAPS Oral Take 2 capsules (600 mg total) by mouth 3 (three) times daily. 60 capsule 3  . GLIPIZIDE 10  MG PO TABS Oral Take 1 tablet (10 mg total) by mouth daily. 30 tablet 3  . HYDRALAZINE HCL 10 MG PO TABS Oral Take 10 mg by mouth 3 (three) times daily.    . INSULIN GLARGINE 100 UNIT/ML [place] SOLN Subcutaneous Inject 15 Units into the skin at bedtime. 10 mL 3  . OMEPRAZOLE 20 MG PO CPDR Oral Take 1 capsule (20 mg total) by mouth daily. 30 capsule 0  . OXYCODONE-ACETAMINOPHEN 5-325 MG PO TABS Oral Take 1 tablet by mouth every 4 (four) hours as needed. For pain Prescribed by Hospice.    Marland Kitchen ROSUVASTATIN CALCIUM 10 MG PO TABS Oral Take 1 tablet (10 mg total) by mouth daily. 30 tablet 3  . SENNA 8.6 MG PO TABS Oral Take 1 tablet by mouth daily.    . TORSEMIDE 20 MG PO TABS Oral Take 3 tablets (60 mg total) by mouth daily. 90 tablet 6  . ZOLPIDEM TARTRATE 5 MG PO TABS Oral Take 1 tablet (5 mg total) by mouth at bedtime. 30 tablet 0  .  INSULIN SYRINGE 31G X 5/16" 0.5 ML MISC  Use to give insulin at bedtime 100 each 3  . INSULIN SYRINGES (DISPOSABLE) U-100 0.3 ML MISC Does not apply 15 Units by Does not apply route at bedtime. 100 each 3    BP 133/67  Temp 98.3 F (36.8 C) (Oral)  Resp 18  SpO2 98%  Physical Exam  Nursing notes reviewed.  Electronic medical record reviewed. VITAL SIGNS:   Filed Vitals:   10/05/11 1931 10/05/11 2309  BP: 133/67 127/88  Pulse:  97  Temp: 98.3 F (36.8 C)   TempSrc: Oral   Resp: 18 20  SpO2: 98% 100%   CONSTITUTIONAL: Awake, oriented, appears non-toxic HENT: Atraumatic, normocephalic, oral mucosa pink and moist, airway patent. Nares patent without drainage. External ears normal. EYES: Conjunctiva clear, EOMI, PERRLA NECK: Trachea midline, non-tender, supple CARDIOVASCULAR: Normal heart rate, Normal rhythm, enlarged heart, systolic murmur PULMONARY/CHEST: Clear to auscultation, no rhonchi, wheezes, or rales. Symmetrical breath sounds. Non-tender. ABDOMINAL: Non-distended, soft, non-tender - no rebound or guarding.  BS normal. NEUROLOGIC: Non-focal, moving all four extremities, no gross sensory or motor deficits. EXTREMITIES: No clubbing, cyanosis. 2+ lower extremity edema SKIN: Warm, Dry, No erythema, No rash  ED Course  Procedures (including critical care time)  Labs Reviewed  CBC WITH DIFFERENTIAL - Abnormal; Notable for the following:    Eosinophils Relative 7 (*)     All other components within normal limits  COMPREHENSIVE METABOLIC PANEL - Abnormal; Notable for the following:    Glucose, Bld 134 (*)     BUN 24 (*)     Creatinine, Ser 1.71 (*)     Albumin 3.3 (*)     Total Bilirubin 1.7 (*)     GFR calc non Af Amer 47 (*)     GFR calc Af Amer 54 (*)     All other components within normal limits  PRO B NATRIURETIC PEPTIDE - Abnormal; Notable for the following:    Pro B Natriuretic peptide (BNP) 5931.0 (*)     All other components within normal limits   Dg Chest  2 View  10/05/2011  *RADIOLOGY REPORT*  Clinical Data: Weakness and shortness of breath.  CHEST - 2 VIEW  Comparison: CT chest 05/13/2010.  PA and lateral chest 05/13/2010 and single view of the chest 08/10/2011.  Findings: AICD is in place.  There is cardiomegaly and pulmonary vascular congestion.  No focal airspace  disease or effusion.  IMPRESSION: Cardiomegaly and pulmonary vascular congestion.   Original Report Authenticated By: Bernadene Bell. D'ALESSIO, M.D.      1. Neuropathy   2. DM (diabetes mellitus)   3. Hyperlipidemia   4. Acute on chronic systolic heart failure   5. Acute respiratory failure   6. Depression       MDM  [name] is a 46 y.o. male is an unfortunate gentleman with hypertension, diabetes 2 and history of alcoholism who also has a dilated cardiomyopathy with a 15-20% EF. Patient has been receiving hospice care for pain management but presents this evening with shortness of breath. Patient had in the mid and 03/2011 with class IV NYHA symptoms requiring milrinone therapy and IV Lasix - at that time patient had severe volume overload and cardiac insufficiency. He is wearing his CPAP at night, he is off Lasix and off spirinolactone. He says he was switched to [facility] which is not controlling his fluid and has gained 15 pounds over the last week.  Patient is placed on BiPAP, is not hypoxic but I think he needs some pressure support. Protocol labs are unremarkable - with the exception for elevated BNP. Chest x-ray does show some vascular congestion - patient will need some diuresis and admission to the hospital. Discussed with Dr. Mayford Knife from cardiology for admission.  Patient is stable.      Jones Skene, MD 10/07/11 0300

## 2011-10-05 NOTE — ED Notes (Signed)
Respiratory called for bipap.

## 2011-10-05 NOTE — ED Notes (Signed)
Pt reports anterior chest wall pain, BLE swelling, SOB, weakness, and headache. Pt reports he has gained 15 lbs x1 week. Pt had an appt. W/cardiologist on Wednesday, refused to go, spouse made pt come here to ED.

## 2011-10-06 ENCOUNTER — Encounter (HOSPITAL_COMMUNITY): Payer: Self-pay | Admitting: *Deleted

## 2011-10-06 DIAGNOSIS — F329 Major depressive disorder, single episode, unspecified: Secondary | ICD-10-CM

## 2011-10-06 DIAGNOSIS — J9600 Acute respiratory failure, unspecified whether with hypoxia or hypercapnia: Secondary | ICD-10-CM

## 2011-10-06 LAB — MRSA PCR SCREENING: MRSA by PCR: NEGATIVE

## 2011-10-06 LAB — BASIC METABOLIC PANEL
BUN: 24 mg/dL — ABNORMAL HIGH (ref 6–23)
CO2: 30 mEq/L (ref 19–32)
Calcium: 8.8 mg/dL (ref 8.4–10.5)
Creatinine, Ser: 1.72 mg/dL — ABNORMAL HIGH (ref 0.50–1.35)
Glucose, Bld: 125 mg/dL — ABNORMAL HIGH (ref 70–99)

## 2011-10-06 LAB — MAGNESIUM: Magnesium: 2 mg/dL (ref 1.5–2.5)

## 2011-10-06 LAB — COMPREHENSIVE METABOLIC PANEL
ALT: 19 U/L (ref 0–53)
Alkaline Phosphatase: 72 U/L (ref 39–117)
CO2: 24 mEq/L (ref 19–32)
GFR calc Af Amer: 58 mL/min — ABNORMAL LOW (ref >90)
GFR calc non Af Amer: 50 mL/min — ABNORMAL LOW (ref >90)
Glucose, Bld: 111 mg/dL — ABNORMAL HIGH (ref 70–99)
Potassium: 4 mEq/L (ref 3.5–5.1)
Sodium: 139 mEq/L (ref 135–145)
Total Protein: 6.9 g/dL (ref 6.0–8.3)

## 2011-10-06 LAB — CBC WITH DIFFERENTIAL/PLATELET
Lymphocytes Relative: 22 % (ref 12–46)
Lymphs Abs: 1.6 10*3/uL (ref 0.7–4.0)
Neutro Abs: 4.5 10*3/uL (ref 1.7–7.7)
Neutrophils Relative %: 62 % (ref 43–77)
Platelets: 214 10*3/uL (ref 150–400)
RBC: 4.98 MIL/uL (ref 4.22–5.81)
WBC: 7.2 10*3/uL (ref 4.0–10.5)

## 2011-10-06 LAB — GLUCOSE, CAPILLARY
Glucose-Capillary: 152 mg/dL — ABNORMAL HIGH (ref 70–99)
Glucose-Capillary: 125 mg/dL — ABNORMAL HIGH (ref 70–99)
Glucose-Capillary: 124 mg/dL — ABNORMAL HIGH (ref 70–99)

## 2011-10-06 LAB — TROPONIN I
Troponin I: <0.3 ng/mL (ref <0.30)
Troponin I: <0.3 ng/mL (ref <0.30)

## 2011-10-06 MED ORDER — CLORAZEPATE DIPOTASSIUM 7.5 MG PO TABS: 7.5000 mg | ORAL_TABLET | Freq: Two times a day (BID) | ORAL | Status: DC

## 2011-10-06 MED ORDER — CLORAZEPATE DIPOTASSIUM 3.75 MG PO TABS
7.5000 mg | ORAL_TABLET | Freq: Every day | ORAL | Status: DC
  Administered 2011-10-06 – 2011-10-18 (×13): 7.5 mg via ORAL
  Filled 2011-10-06 (×2): qty 2
  Filled 2011-10-06 (×2): qty 1
  Filled 2011-10-06 (×3): qty 2
  Filled 2011-10-06 (×2): qty 1
  Filled 2011-10-06: qty 2
  Filled 2011-10-06: qty 1
  Filled 2011-10-06: qty 2
  Filled 2011-10-06 (×3): qty 1
  Filled 2011-10-06: qty 2
  Filled 2011-10-06: qty 1
  Filled 2011-10-06 (×2): qty 2
  Filled 2011-10-06: qty 4

## 2011-10-06 MED ORDER — SODIUM CHLORIDE 0.9 % IV SOLN
250.0000 mL | INTRAVENOUS | Status: DC | PRN
  Administered 2011-10-06: 5 mL/h via INTRAVENOUS

## 2011-10-06 MED ORDER — BENAZEPRIL HCL 40 MG PO TABS
40.0000 mg | ORAL_TABLET | Freq: Every day | ORAL | Status: DC
  Administered 2011-10-06 – 2011-10-10 (×5): 40 mg via ORAL
  Filled 2011-10-06 (×6): qty 1

## 2011-10-06 MED ORDER — CLONAZEPAM 0.5 MG PO TABS
0.5000 mg | ORAL_TABLET | Freq: Three times a day (TID) | ORAL | Status: DC | PRN
  Administered 2011-10-06 – 2011-10-08 (×3): 0.5 mg via ORAL
  Filled 2011-10-06 (×3): qty 1

## 2011-10-06 MED ORDER — GLIPIZIDE 10 MG PO TABS
10.0000 mg | ORAL_TABLET | Freq: Every day | ORAL | Status: DC
  Administered 2011-10-06 – 2011-10-18 (×13): 10 mg via ORAL
  Filled 2011-10-06 (×16): qty 1

## 2011-10-06 MED ORDER — MILRINONE IN DEXTROSE 200-5 MCG/ML-% IV SOLN
0.2500 ug/kg/min | INTRAVENOUS | Status: DC
  Administered 2011-10-06 – 2011-10-07 (×3): 0.25 ug/kg/min via INTRAVENOUS
  Administered 2011-10-07 (×2): 0.375 ug/kg/min via INTRAVENOUS
  Administered 2011-10-08 – 2011-10-09 (×2): 0.25 ug/kg/min via INTRAVENOUS
  Filled 2011-10-06 (×6): qty 100

## 2011-10-06 MED ORDER — SODIUM CHLORIDE 0.9 % IJ SOLN: 3.0000 mL | INTRAMUSCULAR | Status: DC | PRN

## 2011-10-06 MED ORDER — INSULIN GLARGINE 100 UNIT/ML ~~LOC~~ SOLN
15.0000 [IU] | Freq: Every day | SUBCUTANEOUS | Status: DC
  Administered 2011-10-06 – 2011-10-14 (×8): 15 [IU] via SUBCUTANEOUS

## 2011-10-06 MED ORDER — MILRINONE IN DEXTROSE 200-5 MCG/ML-% IV SOLN
0.3750 ug/kg/min | INTRAVENOUS | Status: DC
  Administered 2011-10-06: 0.375 ug/kg/min via INTRAVENOUS
  Filled 2011-10-06: qty 100

## 2011-10-06 MED ORDER — PANTOPRAZOLE SODIUM 40 MG PO TBEC
40.0000 mg | DELAYED_RELEASE_TABLET | Freq: Every day | ORAL | Status: DC
  Administered 2011-10-06 – 2011-10-19 (×13): 40 mg via ORAL
  Filled 2011-10-06 (×13): qty 1

## 2011-10-06 MED ORDER — DIGOXIN 250 MCG PO TABS
0.2500 mg | ORAL_TABLET | Freq: Every day | ORAL | Status: DC
  Administered 2011-10-06 – 2011-10-13 (×8): 0.25 mg via ORAL
  Filled 2011-10-06 (×9): qty 1

## 2011-10-06 MED ORDER — HYDRALAZINE HCL 10 MG PO TABS
10.0000 mg | ORAL_TABLET | Freq: Three times a day (TID) | ORAL | Status: DC
  Filled 2011-10-06 (×3): qty 1

## 2011-10-06 MED ORDER — INSULIN ASPART 100 UNIT/ML ~~LOC~~ SOLN
0.0000 [IU] | Freq: Three times a day (TID) | SUBCUTANEOUS | Status: DC
  Administered 2011-10-06: 2 [IU] via SUBCUTANEOUS
  Administered 2011-10-06: 3 [IU] via SUBCUTANEOUS
  Administered 2011-10-07 – 2011-10-09 (×5): 2 [IU] via SUBCUTANEOUS
  Administered 2011-10-11 – 2011-10-12 (×3): 3 [IU] via SUBCUTANEOUS
  Administered 2011-10-12: 5 [IU] via SUBCUTANEOUS
  Administered 2011-10-14 (×2): 3 [IU] via SUBCUTANEOUS
  Administered 2011-10-14: 2 [IU] via SUBCUTANEOUS
  Administered 2011-10-15: 11 [IU] via SUBCUTANEOUS
  Administered 2011-10-16 (×2): 2 [IU] via SUBCUTANEOUS
  Administered 2011-10-17 – 2011-10-18 (×3): 3 [IU] via SUBCUTANEOUS

## 2011-10-06 MED ORDER — DULOXETINE HCL 60 MG PO CPEP
60.0000 mg | ORAL_CAPSULE | Freq: Every day | ORAL | Status: DC
  Administered 2011-10-06: 60 mg via ORAL
  Filled 2011-10-06 (×2): qty 1

## 2011-10-06 MED ORDER — CLORAZEPATE DIPOTASSIUM 3.75 MG PO TABS
15.0000 mg | ORAL_TABLET | Freq: Every day | ORAL | Status: DC
  Administered 2011-10-06 – 2011-10-18 (×13): 15 mg via ORAL
  Filled 2011-10-06 (×6): qty 4
  Filled 2011-10-06: qty 2
  Filled 2011-10-06 (×5): qty 4

## 2011-10-06 MED ORDER — FUROSEMIDE 10 MG/ML IJ SOLN
80.0000 mg | Freq: Two times a day (BID) | INTRAMUSCULAR | Status: DC
  Administered 2011-10-06 – 2011-10-08 (×6): 80 mg via INTRAVENOUS
  Filled 2011-10-06 (×9): qty 8

## 2011-10-06 MED ORDER — OXYCODONE-ACETAMINOPHEN 5-325 MG PO TABS
1.0000 | ORAL_TABLET | Freq: Four times a day (QID) | ORAL | Status: DC | PRN
  Administered 2011-10-06 – 2011-10-07 (×3): 1 via ORAL
  Administered 2011-10-08 – 2011-10-15 (×14): 2 via ORAL
  Administered 2011-10-15 (×2): 1 via ORAL
  Administered 2011-10-16 – 2011-10-19 (×8): 2 via ORAL
  Filled 2011-10-06 (×3): qty 2
  Filled 2011-10-06: qty 1
  Filled 2011-10-06 (×3): qty 2
  Filled 2011-10-06: qty 1
  Filled 2011-10-06: qty 2
  Filled 2011-10-06 (×2): qty 1
  Filled 2011-10-06 (×10): qty 2
  Filled 2011-10-06: qty 1
  Filled 2011-10-06 (×5): qty 2

## 2011-10-06 MED ORDER — ATORVASTATIN CALCIUM 20 MG PO TABS
20.0000 mg | ORAL_TABLET | Freq: Every day | ORAL | Status: DC
  Administered 2011-10-06 – 2011-10-19 (×13): 20 mg via ORAL
  Filled 2011-10-06 (×14): qty 1

## 2011-10-06 MED ORDER — MORPHINE SULFATE 2 MG/ML IJ SOLN
2.0000 mg | Freq: Once | INTRAMUSCULAR | Status: AC
  Administered 2011-10-06: 2 mg via INTRAVENOUS
  Filled 2011-10-06: qty 1

## 2011-10-06 MED ORDER — SODIUM CHLORIDE 0.9 % IJ SOLN
3.0000 mL | Freq: Two times a day (BID) | INTRAMUSCULAR | Status: DC
  Administered 2011-10-06 – 2011-10-08 (×6): 3 mL via INTRAVENOUS
  Administered 2011-10-08: 10:00:00 via INTRAVENOUS
  Administered 2011-10-09 – 2011-10-18 (×19): 3 mL via INTRAVENOUS

## 2011-10-06 MED ORDER — ACETAMINOPHEN 325 MG PO TABS
650.0000 mg | ORAL_TABLET | ORAL | Status: DC | PRN
  Filled 2011-10-06: qty 2

## 2011-10-06 MED ORDER — ONDANSETRON HCL 4 MG/2ML IJ SOLN: 4.0000 mg | Freq: Four times a day (QID) | INTRAMUSCULAR | Status: DC | PRN

## 2011-10-06 MED ORDER — SENNA 8.6 MG PO TABS
1.0000 | ORAL_TABLET | Freq: Every day | ORAL | Status: DC
  Administered 2011-10-06 – 2011-10-18 (×13): 8.6 mg via ORAL
  Filled 2011-10-06 (×14): qty 1

## 2011-10-06 MED ORDER — CARVEDILOL 6.25 MG PO TABS
6.2500 mg | ORAL_TABLET | Freq: Two times a day (BID) | ORAL | Status: DC
  Administered 2011-10-06 – 2011-10-08 (×5): 6.25 mg via ORAL
  Filled 2011-10-06 (×9): qty 1

## 2011-10-06 MED ORDER — GABAPENTIN 300 MG PO CAPS
600.0000 mg | ORAL_CAPSULE | Freq: Three times a day (TID) | ORAL | Status: DC
  Administered 2011-10-06 – 2011-10-19 (×37): 600 mg via ORAL
  Filled 2011-10-06 (×44): qty 2

## 2011-10-06 NOTE — Progress Notes (Signed)
Patient refusing Bipap and asking for regular CPAP machine. Received orders and set on autoset CPAP with 2L oxygen bled in with a nasal mask.

## 2011-10-06 NOTE — ED Notes (Signed)
Pt okay to have ice chips per MD and respiratory called for humidity to be admitted.

## 2011-10-06 NOTE — Progress Notes (Signed)
Pt c/o nose hurting where bipap is reseting; pt put on [place]; will continue to monitor closely and update as needed

## 2011-10-06 NOTE — Progress Notes (Signed)
Advised pt to call RN when standing up at bedside to use urinal - pt refuses ; pt educated and signed falls prevention contract

## 2011-10-06 NOTE — Plan of Care (Signed)
Problem: Phase I Progression Outcomes Goal: Voiding-avoid urinary catheter unless indicated Outcome: Progressing Pt refused

## 2011-10-06 NOTE — Progress Notes (Addendum)
Room 2905 - [name] -  [facility] RN Visit  Related admission to [facility] diagnosis of ETOH Cardiomyopathy.  Pt is FULL code.    Pt drowsy, oriented, lying in bed, without complaints of pain or discomfort.  Pt states he was up all night in the ED and has been sleeping all morning.  No family present.    Pt states he had chest tightness and pain;  Pt states his Lasix has "stopped working" and he had gained fluid.   Discussed with staff RN who stated pt had told her he was depressed and stopped taking his medicine - causing fluid overload.    Please call [facility] @ [phone] with any hospice needs.  Thank you.  Joneen Boers, RN  [facility]  Hospice Liaison

## 2011-10-06 NOTE — Progress Notes (Signed)
Pt weighed on standing scale with grown, socks, underwear and bipap mask on -Lead wires and bipap tubing was held     WEIGHT: 103.6kg.
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
## 2011-10-06 NOTE — Progress Notes (Signed)
Pt. Complaining of bipap mask hurting his face and requests to take the mask off. Pt. sats is 99% on bipap. RN switched pt. To nasal cannula 2L. RT and RN will continue to monitor pt.

## 2011-10-06 NOTE — Care Management Note (Addendum)
    Page 1 of 1   10/08/2011     11:12:26 AM   CARE MANAGEMENT NOTE 10/08/2011  Patient:  [name], [name]   Account Number:  [id number]  Date Initiated:  10/06/2011  Documentation initiated by:  Junius Creamer  Subjective/Objective Assessment:   adm w chf     Action/Plan:   lives w wife, pcp dr Loistine Chance, act w [facility] aware of pt's adm   Anticipated DC Date:  10/09/2011   Anticipated DC Plan:  HOME W HOSPICE CARE      DC Planning Services  CM consult      [facility] Choice  Resumption Of Svcs/PTA Provider   Choice offered to / List presented to:          [facility] arranged  HH-1 RN  HH-6 SOCIAL WORKER      HH agency  HOSPICE   Status of service:   Medicare Important Message given?   (If response is "NO", the following Medicare IM given date fields will be blank) Date Medicare IM given:   Date Additional Medicare IM given:    Discharge Disposition:  HOME W HOSPICE CARE  Per UR Regulation:  Reviewed for med. necessity/level of care/duration of stay  If discussed at Long Length of Stay Meetings, dates discussed:    Comments:  9/20 11:11a debbie Tikesha Mort rn,bsn [phone] spoke w Lowella Bandy pa for dr bensimhon and no milrinone at disch. will alert hospice also of dc planned for 9-21.  9/18 9:37a debbie dowel rn,bsn [phone]

## 2011-10-06 NOTE — Addendum Note (Signed)
Addended by: Neomia Dear on: 10/06/2011 04:33 PM   Modules accepted: Orders

## 2011-10-07 LAB — GLUCOSE, CAPILLARY: Glucose-Capillary: 113 mg/dL — ABNORMAL HIGH (ref 70–99)

## 2011-10-07 LAB — BASIC METABOLIC PANEL
BUN: 25 mg/dL — ABNORMAL HIGH (ref 6–23)
CO2: 30 mEq/L (ref 19–32)
Calcium: 8.8 mg/dL (ref 8.4–10.5)
Creatinine, Ser: 1.85 mg/dL — ABNORMAL HIGH (ref 0.50–1.35)

## 2011-10-07 MED ORDER — DULOXETINE HCL 60 MG PO CPEP
60.0000 mg | ORAL_CAPSULE | Freq: Every day | ORAL | Status: DC
  Administered 2011-10-07 – 2011-10-19 (×13): 60 mg via ORAL
  Filled 2011-10-07 (×14): qty 1

## 2011-10-07 NOTE — Progress Notes (Signed)
MD notified of pt's 8 beat run of wide complex tachycardia. No new orders received; will continue to monitor closely and update as needed

## 2011-10-07 NOTE — Progress Notes (Signed)
RM [id number]                [name]                      [facility] SW Note:  Pt was sleeping when Sw arrived. Sw attempted to wake pt up. He would awaken for a few seconds and then drift off to sleep. He was able to greet Sw and he was able to inform that he was very tired but denied pain. Assured pt that Sw would return to check on him. Althia Forts, LCSW

## 2011-10-07 NOTE — Progress Notes (Signed)
[facility])  Homecare Chaplain Note:  Patient (pt) [name]   [id number]      [facility] homecare chaplain saw pt in his home around 11:00 AM on the day of his admission (10/05/11) to hospital.  At that time pt complained only of drowsiness.  During chaplain's visit pt also rec'd a call from their eldest child's school, saying she had not been doing her school work.  Pt indicated he was very upset about this and became quite anxious.  Chaplain's interventions were around active listening and normalization of child's experience as part of entering middle school.       Chaplain found pt sleeping peacefully on this morning's visit.  Chaplain left a note for pt on bedside table and will follow.  Beverly Isley-Landreth ThM, BCCC [facility] Clinical Chaplain
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
## 2011-10-07 NOTE — Progress Notes (Signed)
Patient was asleep when CSW stopped by. CSW will continue to follow and will return tomorrow.  Sabino Niemann, MSW, [employer] [phone]

## 2011-10-08 LAB — GLUCOSE, CAPILLARY
Glucose-Capillary: 125 mg/dL — ABNORMAL HIGH (ref 70–99)
Glucose-Capillary: 121 mg/dL — ABNORMAL HIGH (ref 70–99)
Glucose-Capillary: 118 mg/dL — ABNORMAL HIGH (ref 70–99)

## 2011-10-08 LAB — BASIC METABOLIC PANEL
CO2: 26 mEq/L (ref 19–32)
Calcium: 8.9 mg/dL (ref 8.4–10.5)
Chloride: 102 mEq/L (ref 96–112)
Glucose, Bld: 111 mg/dL — ABNORMAL HIGH (ref 70–99)
Sodium: 137 mEq/L (ref 135–145)

## 2011-10-08 MED ORDER — CLONAZEPAM 0.5 MG PO TABS: 0.2500 mg | ORAL_TABLET | Freq: Three times a day (TID) | ORAL | Status: DC | PRN

## 2011-10-08 MED ORDER — DIAZEPAM 5 MG PO TABS
5.0000 mg | ORAL_TABLET | Freq: Three times a day (TID) | ORAL | Status: DC | PRN
  Administered 2011-10-08 – 2011-10-18 (×8): 5 mg via ORAL
  Filled 2011-10-08 (×8): qty 1

## 2011-10-08 NOTE — Progress Notes (Addendum)
Called by CSW because patient was having auditory and visual hallucinations after waking up.  Have discussed with CSW and NSG.  Upon calling the NSG the patient was awake, eating dinner and speaking with family on the phone.  He said his "eyes were blurred but he just got up".  Have called the on call PA with [place], [facility]  She evaluated the patient and he was alert and oriented x3.  He did say he saw his grandmother earlier today but not recently.  He has had these visions before.  Vitals remained stable.  I have decreased klonopin 0.25 mg.  Will continue to monitor.  Robbi Garter, [facility] 6:54 PM

## 2011-10-08 NOTE — Progress Notes (Signed)
Rm 2921       [name]                             [facility] SW Note:  Pt was sound asleep when Sw arrived. He awakened for a few seconds before drifting off to sleep to inform that he may be discharged home tomorrow. Explained that hospice will follow up as usual the day after he is discharged. Will continue to follow as needed to extend support. Althia Forts, LCSW

## 2011-10-08 NOTE — Progress Notes (Addendum)
CSW met with patient at bedside. Patient was asleep but woke up for a brief period. Patient stated, "I'm seeing double" and reported having auditory and visual hallucinations. Patient attributed his symptoms to a medication he received earlier in the day.  Due to patient's condition, CSW spoke briefly with patient about the death of his friend and how he was feeling. Patient reported that he spoke with his wife and prayed with his family. Patient reported being more at peace with his friend's death. Patient reported no feelings of revenge which he reported earlier when CSW first spoke with patient on 10/06/2011.  When asked how he planned on memorializing his friend, patient reported that he plans to make t-shirts with his friend's picture on them. Patient stated,  "I don't want people to forget him". CSW validated patient's feelings and voiced support for his plan to memorialize his friend. Patient started to become more drowsy, so CSW ended the conversation and told the patient that CSW will follow up with him in AHF clinic. Clinical Social Worker will sign off for now as social work intervention is no longer needed. Please consult [place] again if new need arises.   Sabino Niemann, MSW, [employer] [phone]

## 2011-10-08 NOTE — Progress Notes (Signed)
Pt got upset claiming that he'd been ordering fruit plate for breakfast since this am. Service response claimed that pt.  keep on calling for food and discussed with PA if he is allowed to have extra  Portion of food who says no. Order given to have only 1 tray every meal. Discussed matter with dietician and service response. Pt claimed that he is going to call his family and ask them to bring fruits from home. Seen by dietician.

## 2011-10-08 NOTE — Progress Notes (Addendum)
Attempted to place patient on CPAP and was successful. Placed on previous settings. Tolerating well. Sats 98% Hr 80 with good BBS at this time.

## 2011-10-08 NOTE — Progress Notes (Signed)
When asked how he feels claimed " I have blurry vision" and looks sleepy. PA aware. Continue to monitor.

## 2011-10-08 NOTE — Progress Notes (Signed)
Woke up and claimed that he was talking to his wife  and daughter who were placing food in bed. And claimed" what is going on ". Pt is sleepy.

## 2011-10-08 NOTE — Progress Notes (Signed)
Room 2921 - [name] -  [facility] RN Visit  Related admission to [facility] diagnosis of alcoholic cardiomyopathy.  Pt is FULL code.    Pt drowsy & oriented, lying in bed, with complaints of pain -staff RN in room and will get Percocet 5/325 x 2 tabs.  Pt complaining-states he wants the 10mg  "like he gets at home".   Staff RN attempted to educate pt on dosage but pt was not receptive.    No family present.  Pt also complaining of breakfast - ate 100% and wanted fruit "tray" not a fruit cup.  Assisted pt to make call to kitchen to make his request.   Per patient and CM RN Eunice Blase - pt to be discharge back to home on Sat 9/21 or Sun 9/22.  Will advise [facility] staff for follow up following discharge.   Please call [facility] @ [phone] with any hospice needs.  Thank you.  Joneen Boers, RN  [facility]  Hospice Liaison

## 2011-10-08 NOTE — Progress Notes (Signed)
Advanced Heart Failure Rounding Note  Subjective:    [name] is a 46 y/o male with HTN and DM2 with longstanding HF due to NICM (probably ETOH), EF 20%. Cath in 2008 showed left circumflex with 40% stenosis.  He is followed by [facility].    He presented to the ER with SOB, chest tightness.  He says his best friend was shot and killed a couple of weeks ago.  He has been depressed since that time and may have missed some meds.  Weight up 10 pounds from last clinic visit.  ProBNP 5931.   Placed on milrinone  Milrinone increased 0.375 yesterday but pt became hypotensive therefore cut back to 0.25.  Cr improving 1.57.    Breathing better.  No chest pain.  Eating breakfast   Objective:   Weight Range:  Vital Signs:   Temp:  [97.3 F (36.3 C)-98.4 F (36.9 C)] 98.4 F (36.9 C) (09/20 0400) Pulse Rate:  [74-103] 81  (09/19 1445) Resp:  [16] 16  (09/19 1528) BP: (82-118)/(51-79) 112/78 mmHg (09/20 0500) SpO2:  [94 %-100 %] 100 % (09/20 0400) Weight:  [102.105 kg (225 lb 1.6 oz)] 102.105 kg (225 lb 1.6 oz) (09/20 0600) Last BM Date: 10/06/11  Weight change: Filed Weights   10/06/11 0343 10/07/11 0500 10/08/11 0600  Weight: 103.6 kg (228 lb 6.3 oz) 103 kg (227 lb 1.2 oz) 102.105 kg (225 lb 1.6 oz)    Intake/Output:   Intake/Output Summary (Last 24 hours) at 10/08/11 0736 Last data filed at 10/08/11 0700  Gross per 24 hour  Intake 2709.2 ml  Output   5900 ml  Net -3190.8 ml     Physical Exam: General:  Chronically ill appearing, No resp difficulty on 2L HEENT: normal Neck: supple. JVP 6-7 + HJR. Carotids 2+ bilat; no bruits. No lymphadenopathy or thryomegaly appreciated. Cor: PMI nondisplaced. Regular rate & rhythm. 2/6 MR, +S3 Lungs: clear Abdomen: soft, nontender, + distention. No hepatosplenomegaly. No bruits or masses. Good bowel sounds. Extremities: no cyanosis, clubbing, rash, edema Neuro: alert & orientedx3, cranial nerves grossly intact. moves all 4  extremities w/o difficulty. Flat pleasant  Telemetry: Sinus rhythm   Labs: Basic Metabolic Panel:  Lab 10/08/11 1610 10/07/11 0434 10/06/11 2110 10/06/11 0343 10/05/11 1946  NA 137 135 138 139 139  K 4.0 4.1 4.3 4.0 4.0  CL 102 98 98 102 102  CO2 26 30 30 24 25   GLUCOSE 111* 112* 125* 111* 134*  BUN 22 25* 24* 24* 24*  CREATININE 1.57* 1.85* 1.72* 1.62* 1.71*  CALCIUM 8.9 8.8 8.8 -- --  MG -- -- -- 2.0 --  PHOS -- -- -- -- --    Liver Function Tests:  Lab 10/06/11 0343 10/05/11 1946  AST 24 23  ALT 19 19  ALKPHOS 72 73  BILITOT 1.9* 1.7*  PROT 6.9 7.1  ALBUMIN 3.3* 3.3*   No results found for this basename: LIPASE:5,AMYLASE:5 in the last 168 hours No results found for this basename: AMMONIA:3 in the last 168 hours  CBC:  Lab 10/06/11 0343 10/05/11 1946  WBC 7.2 7.5  NEUTROABS 4.5 4.8  HGB 14.5 14.8  HCT 41.8 43.3  MCV 83.9 84.6  PLT 214 210    Cardiac Enzymes:  Lab 10/06/11 1524 10/06/11 0907 10/06/11 0344  CKTOTAL -- -- --  CKMB -- -- --  CKMBINDEX -- -- --  TROPONINI <0.30 <0.30 <0.30    BNP: BNP (last 3 results)  Basename 10/05/11 1947 09/03/11  1502 06/13/11 1115  PROBNP 5931.0* 3290.0* 3799.0*    Imaging: No results found.   Medications:     Scheduled Medications:    . atorvastatin  20 mg Oral q1800  . benazepril  40 mg Oral Daily  . carvedilol  6.25 mg Oral BID WC  . clorazepate  15 mg Oral QHS  . clorazepate  7.5 mg Oral Daily  . digoxin  0.25 mg Oral Daily  . DULoxetine  60 mg Oral Daily  . furosemide  80 mg Intravenous BID  . gabapentin  600 mg Oral TID  . glipiZIDE  10 mg Oral QAC breakfast  . insulin aspart  0-15 Units Subcutaneous TID WC  . insulin glargine  15 Units Subcutaneous QHS  . pantoprazole  40 mg Oral Q1200  . senna  1 tablet Oral Daily  . sodium chloride  3 mL Intravenous Q12H  . DISCONTD: DULoxetine  60 mg Oral Daily    Infusions:    . milrinone 0.25 mcg/kg/min (10/07/11 2222)    PRN  Medications: sodium chloride, acetaminophen, clonazePAM, ondansetron (ZOFRAN) IV, oxyCODONE-acetaminophen, sodium chloride   Assessment:   1) A/C Systolic HF      -on Hospice  2) NICM, ETOH       -EF 15% (3/13)  3) OSA on CPAP  4) DM2  5) H/o noncompliance  6) A/C renal failure due to cardiorenal syndrome  7) Depression  8) Acute respiratory failure - resolving with diuresis  Plan/Discussion:    Continues to diurese slowly.  Will push diuresis for 1 more day with milrinone on board.  Continue to follow Cr closely.  Up ~5 pounds from baseline.  If weight improving tomorrow can start weaning milrinone and hopefully home on Sunday.  Cymbalta restarted for depression.  CSW following.        Will enroll in paramedicine program at d/c for closer f/u.  Has follow up in HF clinic next week.  Length of Stay: 3  Saxon Barich,MD 3:34 PM

## 2011-10-09 LAB — GLUCOSE, CAPILLARY
Glucose-Capillary: 136 mg/dL — ABNORMAL HIGH (ref 70–99)
Glucose-Capillary: 119 mg/dL — ABNORMAL HIGH (ref 70–99)
Glucose-Capillary: 126 mg/dL — ABNORMAL HIGH (ref 70–99)

## 2011-10-09 LAB — BASIC METABOLIC PANEL
Calcium: 8.8 mg/dL (ref 8.4–10.5)
GFR calc Af Amer: 55 mL/min — ABNORMAL LOW (ref >90)
GFR calc non Af Amer: 47 mL/min — ABNORMAL LOW (ref >90)
Glucose, Bld: 129 mg/dL — ABNORMAL HIGH (ref 70–99)
Potassium: 4 mEq/L (ref 3.5–5.1)
Sodium: 136 mEq/L (ref 135–145)

## 2011-10-09 MED ORDER — TORSEMIDE 20 MG PO TABS
60.0000 mg | ORAL_TABLET | Freq: Every day | ORAL | Status: DC
  Administered 2011-10-09: 60 mg via ORAL
  Filled 2011-10-09 (×2): qty 3

## 2011-10-09 MED ORDER — CARVEDILOL 3.125 MG PO TABS
3.1250 mg | ORAL_TABLET | Freq: Two times a day (BID) | ORAL | Status: DC
  Administered 2011-10-09 – 2011-10-11 (×5): 3.125 mg via ORAL
  Filled 2011-10-09 (×8): qty 1

## 2011-10-09 MED ORDER — SODIUM CHLORIDE 0.9 % IV SOLN
INTRAVENOUS | Status: DC
  Administered 2011-10-09: 12.2 mL/h via INTRAVENOUS

## 2011-10-09 MED ORDER — ISOSORBIDE MONONITRATE 15 MG HALF TABLET
15.0000 mg | ORAL_TABLET | Freq: Every day | ORAL | Status: DC
  Administered 2011-10-09 – 2011-10-10 (×2): 15 mg via ORAL
  Filled 2011-10-09 (×3): qty 1

## 2011-10-09 MED ORDER — HYDRALAZINE HCL 20 MG/ML IJ SOLN
10.0000 mg | Freq: Three times a day (TID) | INTRAMUSCULAR | Status: DC
  Administered 2011-10-09 – 2011-10-10 (×5): 10 mg via INTRAVENOUS
  Filled 2011-10-09: qty 0.5
  Filled 2011-10-09: qty 1
  Filled 2011-10-09 (×6): qty 0.5

## 2011-10-09 NOTE — Progress Notes (Signed)
Advanced Heart Failure Rounding Note  Subjective:    Mr. Penrod is a 46 y/o male with HTN and DM2 with longstanding HF due to NICM (probably ETOH), EF 20%. Cath in 2008 showed left circumflex with 40% stenosis.  He is followed by [facility].    He presented to the ER with SOB, chest tightness.  He says his best friend was shot and killed a couple of weeks ago.  He has been depressed since that time and may have missed some meds.  Weight up 10 pounds from last clinic visit.  ProBNP 5931.   Placed on milrinone  Milrinone increased 0.375 earlier in the week but pt became hypotensive therefore cut back to 0.25.   Yesterday apparently had some hallucinations with Klonopin so it was stopped. Now better. Asking for more food and salt. Continues to be difficult with nursing staff.  Breathing better.  No chest pain. Weight up one pound despite -2.5L.   Objective:   Weight Range:  Vital Signs:   Temp:  [97.2 F (36.2 C)-98 F (36.7 C)] 97.2 F (36.2 C) (09/21 0300) Pulse Rate:  [76-87] 76  (09/20 1545) Resp:  [18-19] 18  (09/21 0754) BP: (93-117)/(56-83) 117/66 mmHg (09/21 0300) SpO2:  [95 %-98 %] 95 % (09/21 0300) Weight:  [103.2 kg (227 lb 8.2 oz)] 103.2 kg (227 lb 8.2 oz) (09/21 0500) Last BM Date: 10/06/11  Weight change: Filed Weights   10/07/11 0500 10/08/11 0600 10/09/11 0500  Weight: 103 kg (227 lb 1.2 oz) 102.105 kg (225 lb 1.6 oz) 103.2 kg (227 lb 8.2 oz)    Intake/Output:   Intake/Output Summary (Last 24 hours) at 10/09/11 0758 Last data filed at 10/09/11 0700  Gross per 24 hour  Intake 1326.54 ml  Output   4000 ml  Net -2673.46 ml     Physical Exam: General:  Chronically ill appearing, No resp difficulty on 2L HEENT: normal Neck: supple. JVP 6-7 + HJR. Carotids 2+ bilat; no bruits. No lymphadenopathy or thryomegaly appreciated. Cor: PMI nondisplaced. Regular rate & rhythm. 2/6 MR, +S3 Lungs: clear Abdomen: soft, nontender, + distention. No  hepatosplenomegaly. No bruits or masses. Good bowel sounds. Extremities: no cyanosis, clubbing, rash, edema Neuro: alert & orientedx3, cranial nerves grossly intact. moves all 4 extremities w/o difficulty. Flat pleasant  Telemetry: Sinus rhythm   Labs: Basic Metabolic Panel:  Lab 10/09/11 1610 10/08/11 0453 10/07/11 0434 10/06/11 2110 10/06/11 0343  NA 136 137 135 138 139  K 4.0 4.0 4.1 4.3 4.0  CL 98 102 98 98 102  CO2 26 26 30 30 24   GLUCOSE 129* 111* 112* 125* 111*  BUN 20 22 25* 24* 24*  CREATININE 1.70* 1.57* 1.85* 1.72* 1.62*  CALCIUM 8.8 8.9 8.8 -- --  MG -- -- -- -- 2.0  PHOS -- -- -- -- --    Liver Function Tests:  Lab 10/06/11 0343 10/05/11 1946  AST 24 23  ALT 19 19  ALKPHOS 72 73  BILITOT 1.9* 1.7*  PROT 6.9 7.1  ALBUMIN 3.3* 3.3*   No results found for this basename: LIPASE:5,AMYLASE:5 in the last 168 hours No results found for this basename: AMMONIA:3 in the last 168 hours  CBC:  Lab 10/06/11 0343 10/05/11 1946  WBC 7.2 7.5  NEUTROABS 4.5 4.8  HGB 14.5 14.8  HCT 41.8 43.3  MCV 83.9 84.6  PLT 214 210    Cardiac Enzymes:  Lab 10/06/11 1524 10/06/11 0907 10/06/11 0344  CKTOTAL -- -- --  CKMB -- -- --  CKMBINDEX -- -- --  TROPONINI <0.30 <0.30 <0.30    BNP: BNP (last 3 results)  Basename 10/05/11 1947 09/03/11 1502 06/13/11 1115  PROBNP 5931.0* 3290.0* 3799.0*    Imaging: No results found.   Medications:     Scheduled Medications:    . atorvastatin  20 mg Oral q1800  . benazepril  40 mg Oral Daily  . carvedilol  6.25 mg Oral BID WC  . clorazepate  15 mg Oral QHS  . clorazepate  7.5 mg Oral Daily  . digoxin  0.25 mg Oral Daily  . DULoxetine  60 mg Oral Daily  . furosemide  80 mg Intravenous BID  . gabapentin  600 mg Oral TID  . glipiZIDE  10 mg Oral QAC breakfast  . insulin aspart  0-15 Units Subcutaneous TID WC  . insulin glargine  15 Units Subcutaneous QHS  . pantoprazole  40 mg Oral Q1200  . senna  1 tablet Oral Daily    . sodium chloride  3 mL Intravenous Q12H    Infusions:    . sodium chloride 12.2 mL/hr (10/09/11 0117)  . milrinone 0.25 mcg/kg/min (10/09/11 0115)    PRN Medications: sodium chloride, acetaminophen, diazepam, ondansetron (ZOFRAN) IV, oxyCODONE-acetaminophen, sodium chloride, DISCONTD: clonazePAM, DISCONTD: clonazePAM   Assessment:   1) A/C Systolic HF      -on Hospice  2) NICM, ETOH       -EF 15% (3/13)  3) OSA on CPAP  4) DM2  5) H/o noncompliance  6) A/C renal failure due to cardiorenal syndrome  7) Depression  8) Acute respiratory failure - resolving with diuresis  Plan/Discussion:    Looks good but weight still up about 5 pounds from baseline. I think much of this is due to noncompliance with dietary restriction. Renal function slightly worse. Will stop milrinone. Resume home regimen. If stable may be able to go home tomorrow. Counseled extensively on need to avoid excess dietary intake.  Cymbalta restarted for depression.  CSW following.        Will enroll in paramedicine program at d/c for closer f/u. I have left a message for [employer]   Has follow up in HF clinic next week.  Length of Stay: 4  Alaynna Kerwood,MD 7:58 AM

## 2011-10-09 NOTE — Progress Notes (Signed)
Transferred to 4711 by wheelchair , stable.  Belongings  with pt.

## 2011-10-09 NOTE — Progress Notes (Signed)
Patient admitted with shortness of breath and chest tightness.  Patient is a full code.  Found patient resting in bed, RN did not disturb.  No signs of pain or discomfort.  No family present.  Reviewed chart and patient's respiratory status stable, worsening renal function, Milrinone stopped.  Patient is still up 5 pounds from baseline weight and was provided education by MD on importance of dietary restrictions.  Anticipate discharge home tomorrow with continued Hospice services.  Continue current plan of care and encourage a call to Hospice at [phone] with any needs.  Charlott Holler, RN, BSN Hospice

## 2011-10-10 ENCOUNTER — Inpatient Hospital Stay (HOSPITAL_COMMUNITY)

## 2011-10-10 DIAGNOSIS — H538 Other visual disturbances: Secondary | ICD-10-CM

## 2011-10-10 DIAGNOSIS — R29810 Facial weakness: Secondary | ICD-10-CM

## 2011-10-10 DIAGNOSIS — H532 Diplopia: Secondary | ICD-10-CM

## 2011-10-10 LAB — BASIC METABOLIC PANEL
BUN: 19 mg/dL (ref 6–23)
Chloride: 99 mEq/L (ref 96–112)
GFR calc Af Amer: 62 mL/min — ABNORMAL LOW (ref >90)
GFR calc non Af Amer: 53 mL/min — ABNORMAL LOW (ref >90)
Potassium: 4.7 mEq/L (ref 3.5–5.1)
Sodium: 134 mEq/L — ABNORMAL LOW (ref 135–145)

## 2011-10-10 LAB — GLUCOSE, CAPILLARY: Glucose-Capillary: 99 mg/dL (ref 70–99)

## 2011-10-10 MED ORDER — METOLAZONE 2.5 MG PO TABS
2.5000 mg | ORAL_TABLET | Freq: Once | ORAL | Status: AC
  Administered 2011-10-10: 2.5 mg via ORAL
  Filled 2011-10-10: qty 1

## 2011-10-10 MED ORDER — TORSEMIDE 20 MG PO TABS
60.0000 mg | ORAL_TABLET | Freq: Two times a day (BID) | ORAL | Status: DC
  Filled 2011-10-10 (×3): qty 3

## 2011-10-10 MED ORDER — PYRIDOSTIGMINE BROMIDE 60 MG PO TABS
30.0000 mg | ORAL_TABLET | Freq: Every day | ORAL | Status: DC
  Administered 2011-10-10 (×2): 30 mg via ORAL
  Administered 2011-10-10: 16:00:00 via ORAL
  Administered 2011-10-10 – 2011-10-12 (×6): 30 mg via ORAL
  Filled 2011-10-10 (×22): qty 0.5

## 2011-10-10 MED ORDER — HYDRALAZINE HCL 10 MG PO TABS
10.0000 mg | ORAL_TABLET | Freq: Three times a day (TID) | ORAL | Status: DC
  Administered 2011-10-10: 10 mg via ORAL
  Filled 2011-10-10 (×5): qty 1

## 2011-10-10 MED ORDER — TORSEMIDE 20 MG PO TABS
60.0000 mg | ORAL_TABLET | Freq: Two times a day (BID) | ORAL | Status: DC
  Administered 2011-10-10: 60 mg via ORAL
  Filled 2011-10-10 (×4): qty 3

## 2011-10-10 NOTE — Progress Notes (Addendum)
Advanced Heart Failure Rounding Note  Subjective:    Mr. Choyce is a 46 y/o male with HTN and DM2 with longstanding HF due to NICM (probably ETOH), EF 20%. Cath in 2008 showed left circumflex with 40% stenosis.  He is followed by [facility].    He presented to the ER with SOB, chest tightness.  He says his best friend was shot and killed a couple of weeks ago.  He has been depressed since that time and may have missed some meds.  Weight up 10 pounds from last clinic visit. (Baseline weight has been about 218)  ProBNP 5931.   Placed on milrinone  Milrinone increased 0.375 earlier in the week but pt became hypotensive therefore cut back to 0.25.   On Friday apparently had some hallucinations with Klonopin so it was stopped. Now better.Cymbalta was restarted. Continues to be difficult with nursing staff demanding more food and calling down to dietary to order more. He has 3 empty fruit cups on his breakfast tray.   This morning is c/o double vision. Says it has been that way for a while and his PCP told him to see an eye doctor but he didn't. I held up two fingers and he told me there were four.  I then had the dietary person go back in and ask him to read the menu and he told her that he could not read the menu as it was blurry.   Objective:   Weight Range:  Vital Signs:   Temp:  [96.8 F (36 C)-97.9 F (36.6 C)] 96.8 F (36 C) (09/22 0449) Pulse Rate:  [80-86] 85  (09/22 0449) Resp:  [18] 18  (09/22 0449) BP: (100-120)/(60-77) 100/68 mmHg (09/22 0449) SpO2:  [97 %-100 %] 99 % (09/22 0449) Weight:  [102.876 kg (226 lb 12.8 oz)-103.6 kg (228 lb 6.3 oz)] 102.876 kg (226 lb 12.8 oz) (09/22 0449) Last BM Date: 10/06/11  Weight change: Filed Weights   10/09/11 0500 10/09/11 1345 10/10/11 0449  Weight: 103.2 kg (227 lb 8.2 oz) 103.6 kg (228 lb 6.3 oz) 102.876 kg (226 lb 12.8 oz)    Intake/Output:   Intake/Output Summary (Last 24 hours) at 10/10/11 0917 Last data filed at  10/10/11 0100  Gross per 24 hour  Intake    330 ml  Output   4500 ml  Net  -4170 ml     Physical Exam: General:  Chronically ill appearing, No resp difficulty on 2L HEENT: normal Neck: supple. JVP 7. Carotids 2+ bilat; no bruits. No lymphadenopathy or thryomegaly appreciated. Cor: PMI nondisplaced. Regular rate & rhythm. 2/6 MR, +S3 Lungs: clear Abdomen: soft, nontender, ? mild distention. No hepatosplenomegaly. No bruits or masses. Good bowel sounds. Extremities: no cyanosis, clubbing, rash, edema Neuro: mildly groggy but engages in coversation & orientedx3, cranial nerves grossly intact. No nystagmus. No dysconjugate gaze. moves all 4 extremities w/o difficulty.   Telemetry: Sinus rhythm   Labs: Basic Metabolic Panel:  Lab 10/10/11 4098 10/09/11 0450 10/08/11 0453 10/07/11 0434 10/06/11 2110 10/06/11 0343  NA 134* 136 137 135 138 --  K 4.7 4.0 4.0 4.1 4.3 --  CL 99 98 102 98 98 --  CO2 27 26 26 30 30  --  GLUCOSE 76 129* 111* 112* 125* --  BUN 19 20 22  25* 24* --  CREATININE 1.54* 1.70* 1.57* 1.85* 1.72* --  CALCIUM 9.1 8.8 8.9 -- -- --  MG -- -- -- -- -- 2.0  PHOS -- -- -- -- -- --  Liver Function Tests:  Lab 10/06/11 0343 10/05/11 1946  AST 24 23  ALT 19 19  ALKPHOS 72 73  BILITOT 1.9* 1.7*  PROT 6.9 7.1  ALBUMIN 3.3* 3.3*   No results found for this basename: LIPASE:5,AMYLASE:5 in the last 168 hours No results found for this basename: AMMONIA:3 in the last 168 hours  CBC:  Lab 10/06/11 0343 10/05/11 1946  WBC 7.2 7.5  NEUTROABS 4.5 4.8  HGB 14.5 14.8  HCT 41.8 43.3  MCV 83.9 84.6  PLT 214 210    Cardiac Enzymes:  Lab 10/06/11 1524 10/06/11 0907 10/06/11 0344  CKTOTAL -- -- --  CKMB -- -- --  CKMBINDEX -- -- --  TROPONINI <0.30 <0.30 <0.30    BNP: BNP (last 3 results)  Basename 10/05/11 1947 09/03/11 1502 06/13/11 1115  PROBNP 5931.0* 3290.0* 3799.0*    Imaging: No results found.   Medications:     Scheduled Medications:    .  atorvastatin  20 mg Oral q1800  . benazepril  40 mg Oral Daily  . carvedilol  3.125 mg Oral BID WC  . clorazepate  15 mg Oral QHS  . clorazepate  7.5 mg Oral Daily  . digoxin  0.25 mg Oral Daily  . DULoxetine  60 mg Oral Daily  . gabapentin  600 mg Oral TID  . glipiZIDE  10 mg Oral QAC breakfast  . hydrALAZINE  10 mg Intravenous TID  . insulin aspart  0-15 Units Subcutaneous TID WC  . insulin glargine  15 Units Subcutaneous QHS  . isosorbide mononitrate  15 mg Oral Daily  . pantoprazole  40 mg Oral Q1200  . senna  1 tablet Oral Daily  . sodium chloride  3 mL Intravenous Q12H  . torsemide  60 mg Oral Daily    Infusions:    . sodium chloride 12.2 mL/hr (10/09/11 0117)    PRN Medications: sodium chloride, acetaminophen, diazepam, ondansetron (ZOFRAN) IV, oxyCODONE-acetaminophen, sodium chloride   Assessment:   1) A/C Systolic HF      -on Hospice  2) NICM, ETOH       -EF 15% (3/13)  3) OSA on CPAP  4) DM2  5) H/o noncompliance  6) A/C renal failure due to cardiorenal syndrome  7) Depression  8) Acute respiratory failure - resolving with diuresis 9) Double vision  Plan/Discussion:    Volume status looks ok on exam but weight only down 2 pounds since admit and still up 8 pounds from baseline. Will double his demadex (and give one dose of metolazone) and see if we can push his weight down a bit further though it is hard with his dietary non-compliance.  I am unsure what to make of his double vision. Initially I thought there may be secondary gain issues in play as he is often reluctant to go home to his stormy home situation. I checked BP manually and it was 102/65. 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 Will ask Neuro to see.  Cymbalta restarted for depression.  CSW following.        Will enroll in paramedicine program at d/c for closer f/u. I have left a message for [employer]   Has follow up in HF clinic next week.  Length of Stay: 5  Riona Lahti,MD 9:17 AM

## 2011-10-10 NOTE — Progress Notes (Signed)
Patient admitted with shortness of breath and chest pain.  Patient is a full code.  Found patient placing order for his dinner tray, no complaints noted.  Reviewed chart and patient complained of double vision earlier this morning and neuro work up was done.  Patient was found to have diplopia and bilateral eyelid ptosis, neurologist believes likely etiology is new onset myasthenia gravis.  Patient to receive CT scan to rule out brain stem lesion and will start trial of Mestinon to see if symptoms improve.  Will continue current plan of care and await discharge planning.  Encourage a call to Hospice at [phone] with any needs.  Charlott Holler, RN, BSN Hospice

## 2011-10-10 NOTE — Progress Notes (Addendum)
Pt blew a VC of 1.8L and NIF -41 cmH2O. SO2 94% on 1.5L [place], HR 89, RR 22. Breath sounds clear bilaterally. Pt showed moderate effort. No adverse reactions.

## 2011-10-10 NOTE — Consult Note (Signed)
[facility] CONSULT NOTE     Reason for Consult: Double vision for about 3 weeks.    HPI:    [name] is an 46 y.o. male with nonischemic cardiomyopathy and congestive heart failure, hypertension, obstructive sleep apnea, diabetes mellitus, implanted cardiac defibrillator and obstructive sleep apnea, who was admitted on 10/05/2011 for exacerbation of heart failure. Neurology consultation was obtained because of the complaint of diplopia which started about 3 weeks ago. Patient had seen his primary physician for this complaint and evaluation by an eye specialist was recommended. He did not complain of diplopia during hospitalization until this morning. He is double vision in all fields of gaze. He also has developed ptosis of both eyelids. He gets short of breath easily and it's unclear whether his fatigue he is due to heart failure or possibly muscle fatigue. He also indicated that his head slight difficulty with swallowing occasionally gets choked. There was no clear description of fatiguing of muscles of mastication with chilling. He said no changes in speech articulation. He has not experienced weakness involving extremities except that he feels his hand grip is reduced bilaterally.  Past Medical History  Diagnosis Date  . Hypertension   . 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 History of peptic ulcer disease     EGD 09/2007 shows prox gastric ulcer with black eschar, treated with PPI  . Depression   . History of alcohol abuse     quit approx 2011-2012  . Angina   . Shortness of breath   . OSA (obstructive sleep apnea)     sleeps with oxygen  . Cardiomyopathy   . ICD (implantable cardiac defibrillator) in place   . Diabetes mellitus     insulin dependent  . GERD (gastroesophageal reflux disease)     Past Surgical History  Procedure Date  .  Cardiac defibrillator placement 04/2007  . Knee surgery   . Skin grafts   . Facial reconstruction surgery     Family History  Problem Relation Age of Onset  . Heart failure Mother   . Hypertension Mother   . Sarcoidosis Mother   . Diabetes type II Mother   . Heart attack Brother     Social History:  reports that he has never smoked. He has never used smokeless tobacco. He reports that he does not drink alcohol or use illicit drugs.  Allergies  Allergen Reactions  . Shellfish Allergy Anaphylaxis  . Adhesive (Tape) Hives  . Zoloft (Sertraline Hcl) Other (See Comments)    Talked to people in his sleep May 2013    Medications:    Scheduled:   . atorvastatin  20 mg Oral q1800  . benazepril  40 mg Oral Daily  . carvedilol  3.125 mg Oral BID WC  . clorazepate  15 mg Oral QHS  . clorazepate  7.5 mg Oral Daily  . digoxin  0.25 mg Oral Daily  . DULoxetine  60 mg Oral Daily  . gabapentin  600 mg Oral TID  . glipiZIDE  10 mg Oral QAC breakfast  . hydrALAZINE  10 mg Intravenous TID  . insulin aspart  0-15 Units Subcutaneous TID WC  . insulin glargine  15 Units Subcutaneous QHS  . isosorbide mononitrate  15 mg Oral  Daily  . metolazone  2.5 mg Oral Once  . pantoprazole  40 mg Oral Q1200  . pyridostigmine  30 mg Oral 5 X Daily  . senna  1 tablet Oral Daily  . sodium chloride  3 mL Intravenous Q12H  . torsemide  60 mg Oral BID  . DISCONTD: torsemide  60 mg Oral Daily  . DISCONTD: torsemide  60 mg Oral BID   ZOX:WRUEAV chloride, acetaminophen, diazepam, ondansetron (ZOFRAN) IV, oxyCODONE-acetaminophen, sodium chloride   Blood pressure 100/68, pulse 80, temperature 96.8 F (36 C), temperature source Axillary, resp. rate 18, height 6' (1.829 m), weight 102.876 kg (226 lb 12.8 oz), SpO2 99.00%.   Neurologic Examination:  Mental Status: Drowsy, oriented, thought content appropriate.  Speech fluent without evidence of aphasia. Able to follow commands without difficulty. Cranial  Nerves: II-Visual fields were normal. III/IV/VI-Pupils were equal and reacted. Gaze was disconjugate at rest as well as on right and left gaze. Downgaze was severely limited bilaterally. Upgaze was disconjugate. Patient had diplopia in all fields of gaze. He also had bilateral moderately severe eyelid ptosis.    V/VII-slightly reduced perception of tactile sensation over left lower face compared to the right; equivocal pterygoid weakness (jaw opening); moderately severe orbicularis oculi and orbicularis oris weakness. VIII-normal. X-normal speech. Motor: 5/5 bilaterally with normal tone and bulk Sensory: Normal throughout. Deep Tendon Reflexes: 1+ and symmetric. Plantars: Flexor bilaterally Cerebellar: Normal upper extremity coordination bilaterally. Carotid auscultation: Normal   Assessment/Plan:  New-onset diplopia and eyelid ptosis as well as facial weakness. Most likely etiology is new onset myasthenia gravis. Brainstem stroke is less likely, particularly with bilateral facial weakness. Diplopia secondary to diabetic ischemic cranial neuropathy is also unlikely.  Plan: 1. CT of the head to rule out brain stem lesion, including stroke. MRI cannot be obtained because patient has a cardiac defibrillator. 2. ACTH receptor antibodies 3. Anti-striated muscle antibodies. 4. Trial of Mestinon starting at 30 mg 5 times per day to see if diplopia and ptosis as well as facial weakness will improve. 5. CT scan of the chest to rule out thymoma if it appears that patient does indeed have myasthenia gravis. 6. Physical therapy and occupational therapy consults for gait abnormality and coordination difficulty secondary to diplopia.  Thank you for asking me to do so [name]'s care. We will continue to follow him with you.  Venetia Maxon M.D. [facility] [phone]  10/10/2011, 10:40 AM

## 2011-10-10 NOTE — Progress Notes (Signed)
VC 1.8L and NIF -41 cmH2o

## 2011-10-10 NOTE — Plan of Care (Signed)
Problem: Phase I Progression Outcomes Goal: EF % per last Echo/documented,Core Reminder form on chart Outcome: Completed/Met Date Met:  10/10/11 EF 15% (04-13-11)

## 2011-10-11 ENCOUNTER — Inpatient Hospital Stay (HOSPITAL_COMMUNITY)

## 2011-10-11 ENCOUNTER — Encounter (HOSPITAL_COMMUNITY): Payer: Self-pay | Admitting: Radiology

## 2011-10-11 DIAGNOSIS — G589 Mononeuropathy, unspecified: Secondary | ICD-10-CM

## 2011-10-11 DIAGNOSIS — I5023 Acute on chronic systolic (congestive) heart failure: Secondary | ICD-10-CM

## 2011-10-11 DIAGNOSIS — G7000 Myasthenia gravis without (acute) exacerbation: Secondary | ICD-10-CM

## 2011-10-11 LAB — GLUCOSE, CAPILLARY: Glucose-Capillary: 152 mg/dL — ABNORMAL HIGH (ref 70–99)

## 2011-10-11 LAB — BASIC METABOLIC PANEL
BUN: 20 mg/dL (ref 6–23)
Chloride: 94 mEq/L — ABNORMAL LOW (ref 96–112)
Creatinine, Ser: 1.84 mg/dL — ABNORMAL HIGH (ref 0.50–1.35)
GFR calc Af Amer: 50 mL/min — ABNORMAL LOW (ref >90)
GFR calc non Af Amer: 43 mL/min — ABNORMAL LOW (ref >90)

## 2011-10-11 LAB — CBC
HCT: 46.7 % (ref 39.0–52.0)
MCHC: 34.7 g/dL (ref 30.0–36.0)
MCV: 84 fL (ref 78.0–100.0)
RDW: 14.6 % (ref 11.5–15.5)

## 2011-10-11 MED ORDER — CALCIUM CARBONATE ANTACID 500 MG PO CHEW
2.0000 | CHEWABLE_TABLET | ORAL | Status: AC
  Filled 2011-10-11: qty 2

## 2011-10-11 MED ORDER — TORSEMIDE 20 MG PO TABS
60.0000 mg | ORAL_TABLET | Freq: Every day | ORAL | Status: DC
  Administered 2011-10-11 – 2011-10-13 (×3): 60 mg via ORAL
  Filled 2011-10-11 (×4): qty 3

## 2011-10-11 MED ORDER — ACD FORMULA A 0.73-2.45-2.2 GM/100ML VI SOLN
Status: AC
  Filled 2011-10-11: qty 500

## 2011-10-11 MED ORDER — SODIUM CHLORIDE 0.9 % IV SOLN
Freq: Once | INTRAVENOUS | Status: AC
  Administered 2011-10-13: 13:00:00 via INTRAVENOUS_CENTRAL

## 2011-10-11 MED ORDER — CALCIUM GLUCONATE 10 % IV SOLN
INTRAVENOUS | Status: AC
  Administered 2011-10-11: 2 g via INTRAVENOUS
  Filled 2011-10-11: qty 20

## 2011-10-11 MED ORDER — CALCIUM CARBONATE ANTACID 500 MG PO CHEW
CHEWABLE_TABLET | ORAL | Status: AC
  Filled 2011-10-11: qty 2

## 2011-10-11 MED ORDER — FENTANYL CITRATE 0.05 MG/ML IJ SOLN
INTRAMUSCULAR | Status: AC | PRN
  Administered 2011-10-11: 25 ug via INTRAVENOUS

## 2011-10-11 MED ORDER — HEPARIN SODIUM (PORCINE) 1000 UNIT/ML IJ SOLN: 1000.0000 [IU] | Freq: Once | INTRAMUSCULAR | Status: DC

## 2011-10-11 MED ORDER — HYDRALAZINE HCL 25 MG PO TABS
25.0000 mg | ORAL_TABLET | Freq: Three times a day (TID) | ORAL | Status: DC
  Administered 2011-10-11 – 2011-10-15 (×12): 25 mg via ORAL
  Filled 2011-10-11 (×16): qty 1

## 2011-10-11 MED ORDER — SODIUM CHLORIDE 0.9 % IV SOLN
Freq: Once | INTRAVENOUS | Status: AC
  Administered 2011-10-12: via INTRAVENOUS_CENTRAL
  Filled 2011-10-11: qty 200

## 2011-10-11 MED ORDER — ACETAMINOPHEN 325 MG PO TABS: 650.0000 mg | ORAL_TABLET | ORAL | Status: DC | PRN

## 2011-10-11 MED ORDER — CALCIUM GLUCONATE 10 % IV SOLN
2.0000 g | INTRAVENOUS | Status: DC | PRN
  Administered 2011-10-11: 2 g via INTRAVENOUS

## 2011-10-11 MED ORDER — ISOSORBIDE MONONITRATE ER 30 MG PO TB24
30.0000 mg | ORAL_TABLET | Freq: Every day | ORAL | Status: DC
  Administered 2011-10-11 – 2011-10-14 (×4): 30 mg via ORAL
  Filled 2011-10-11 (×5): qty 1

## 2011-10-11 MED ORDER — CALCIUM CARBONATE ANTACID 500 MG PO CHEW
2.0000 | CHEWABLE_TABLET | ORAL | Status: DC | PRN
  Administered 2011-10-11: 400 mg via ORAL

## 2011-10-11 MED ORDER — ACD FORMULA A 0.73-2.45-2.2 GM/100ML VI SOLN
500.0000 mL | Status: DC
  Filled 2011-10-11 (×3): qty 500

## 2011-10-11 MED ORDER — SODIUM CHLORIDE 0.9 % IV SOLN
Freq: Once | INTRAVENOUS | Status: AC
  Administered 2011-10-17: 17:00:00 via INTRAVENOUS_CENTRAL

## 2011-10-11 MED ORDER — DIPHENHYDRAMINE HCL 25 MG PO CAPS: 25.0000 mg | ORAL_CAPSULE | Freq: Four times a day (QID) | ORAL | Status: DC | PRN

## 2011-10-11 MED ORDER — SODIUM CHLORIDE 0.9 % IV SOLN: 2.0000 g | INTRAVENOUS | Status: DC | PRN

## 2011-10-11 MED ORDER — SODIUM CHLORIDE 0.9 % IV SOLN: 4.0000 g | INTRAVENOUS | Status: DC | PRN

## 2011-10-11 MED ORDER — SODIUM CHLORIDE 0.9 % IV SOLN
Freq: Once | INTRAVENOUS | Status: AC
  Administered 2011-10-13: 11:00:00 via INTRAVENOUS_CENTRAL
  Filled 2011-10-11 (×3): qty 200

## 2011-10-11 MED ORDER — CEFAZOLIN SODIUM-DEXTROSE 2-3 GM-% IV SOLR
2.0000 g | Freq: Once | INTRAVENOUS | Status: AC
  Administered 2011-10-11: 2 g via INTRAVENOUS
  Filled 2011-10-11: qty 50

## 2011-10-11 MED ORDER — BENAZEPRIL HCL 10 MG PO TABS
10.0000 mg | ORAL_TABLET | Freq: Every day | ORAL | Status: DC
  Filled 2011-10-11: qty 1

## 2011-10-11 NOTE — Progress Notes (Signed)
Repeat potassium 5.0. Discussed with Dr Shirlee Latch. Resume lotensin at 10 mg daily tomorrow. Repeat BMET in am.   Labrea Eccleston NP-C 12:07 PM

## 2011-10-11 NOTE — Progress Notes (Signed)
Patient attempted VC and NIF. Patient achieved 1.45L with good effort for VC and pulled NIF of 43cm, also good effort. O2 sats were 92% on room air. I gave patient the nasal cannula and offered to place it on him but he refused.

## 2011-10-11 NOTE — Progress Notes (Signed)
Pt. Wears cpap at night but prefers to place his mask on himself. Pt. States he wears cpap at home.

## 2011-10-11 NOTE — Procedures (Signed)
Interventional Radiology Procedure Note  Procedure: Placement of right IJ 23 cm tip-to-cuff tunneled HemoSplit HD catheter.  Tip in RA and ready for use. Complications: None Recommendations: - May begin PLEX - Routine line care - Retaining suture may be removed in 14 days - Return to IR for removal when catheter no longer needed  Signed,  Sterling Big, MD Vascular & Interventional Radiologist [facility] Rad[facility]

## 2011-10-11 NOTE — Progress Notes (Signed)
Pt. Performed 2.1L on the VC and -37 on the NIF.

## 2011-10-11 NOTE — H&P (Signed)
Chief Complaint: Poss myasthenia gravis Referring Physician:Bensimhon HPI: [name] is an 46 y.o. male with multiple medical problems who has been admitted with acute on chronic heart failure but has been having some neurologic changes which are suspicious for myasthenia gravis. The primary team and neurology feels the pt may benefit from plasmapheresis and therefore a tunneled pheresis catheter needs to be placed. The pt has never had one of these before. PMHx reviewed as below.  Past Medical History:  Past Medical History  Diagnosis Date  . Hypertension   . 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 History of peptic ulcer disease     EGD 09/2007 shows prox gastric ulcer with black eschar, treated with PPI  . Depression   . History of alcohol abuse     quit approx 2011-2012  . Angina   . Shortness of breath   . OSA (obstructive sleep apnea)     sleeps with oxygen  . Cardiomyopathy   . ICD (implantable cardiac defibrillator) in place   . Diabetes mellitus     insulin dependent  . GERD (gastroesophageal reflux disease)     Past Surgical History:  Past Surgical History  Procedure Date  . Cardiac defibrillator placement 04/2007  . Knee surgery   . Skin grafts   . Facial reconstruction surgery     Family History:  Family History  Problem Relation Age of Onset  . Heart failure Mother   . Hypertension Mother   . Sarcoidosis Mother   . Diabetes type II Mother   . Heart attack Brother     Social History:  reports that he has never smoked. He has never used smokeless tobacco. He reports that he does not drink alcohol or use illicit drugs.  Allergies:  Allergies  Allergen Reactions  . Shellfish Allergy Anaphylaxis  . Adhesive (Tape) Hives  . Zoloft (Sertraline Hcl) Other (See Comments)    Talked to people in his sleep May 2013    Medications: Medications Prior to  Admission  Medication Sig Dispense Refill  . benazepril (LOTENSIN) 40 MG tablet Take 40 mg by mouth daily.      . carvedilol (COREG) 3.125 MG tablet Take 2 tablets (6.25 mg total) by mouth 2 (two) times daily with a meal.      . clorazepate (TRANXENE) 7.5 MG tablet Take 7.5-15 mg by mouth 2 (two) times daily. Take 1 tablet every morning and take 2 tablets at bedtime      . diazepam (VALIUM) 10 MG tablet Take 10 mg by mouth every 6 (six) hours as needed. For anxiety      . digoxin (LANOXIN) 0.25 MG tablet Take 1 tablet (0.25 mg total) by mouth daily.  30 tablet  6  . gabapentin (NEURONTIN) 300 MG capsule Take 2 capsules (600 mg total) by mouth 3 (three) times daily.  60 capsule  3  . glipiZIDE (GLUCOTROL) 10 MG tablet Take 1 tablet (10 mg total) by mouth daily.  30 tablet  3  . hydrALAZINE (APRESOLINE) 10 MG tablet Take 10 mg by mouth 3 (three) times daily.      . insulin glargine (LANTUS) 100 UNIT/ML injection Inject 15 Units into the skin at bedtime.  10 mL  3  . omeprazole (PRILOSEC) 20 MG capsule Take 1 capsule (20 mg total) by mouth daily.  30 capsule  0  . oxyCODONE-acetaminophen (PERCOCET/ROXICET)  5-325 MG per tablet Take 1 tablet by mouth every 4 (four) hours as needed. For pain Prescribed by Hospice.      . rosuvastatin (CRESTOR) 10 MG tablet Take 1 tablet (10 mg total) by mouth daily.  30 tablet  3  . senna (SENOKOT) 8.6 MG TABS Take 1 tablet by mouth daily.      Marland Kitchen torsemide (DEMADEX) 20 MG tablet Take 3 tablets (60 mg total) by mouth daily.  90 tablet  6  . zolpidem (AMBIEN) 5 MG tablet Take 1 tablet (5 mg total) by mouth at bedtime.  30 tablet  0  . Insulin Syringe-Needle U-100 (INSULIN SYRINGE .5CC/31GX5/16") 31G X 5/16" 0.5 ML MISC Use to give insulin at bedtime  100 each  3  . Insulin Syringes, Disposable, U-100 0.3 ML MISC 15 Units by Does not apply route at bedtime.  100 each  3    Please HPI for pertinent positives, otherwise complete 10 system ROS negative.  Physical  Exam: Blood pressure 121/82, pulse 84, temperature 98.3 F (36.8 C), temperature source Oral, resp. rate 18, height 6' (1.829 m), weight 222 lb 12.8 oz (101.061 kg), SpO2 100.00%. Body mass index is 30.22 kg/(m^2).   General Appearance:  Alert, cooperative, no distress, appears stated age  Head:  Normocephalic, without obvious abnormality, atraumatic  ENT: Unremarkable  Neck: Supple, symmetrical, trachea midline  Lungs:   Clear to auscultation bilaterally, no w/r/r, respirations unlabored without use of accessory muscles.  Chest Wall:  No tenderness or deformity  Heart:  Regular rate and rhythm, S1, S2 normal, 2/6 murmur  Abdomen:   Soft, non-tender, non distended. Bowel sounds active all four quadrants,  no masses, no organomegaly.  Extremities: Extremities normal, atraumatic, no cyanosis or edema  Neurologic: Normal affect, no gross deficits. Mildly groggy, but thoughts coherent.   Results for orders placed during the hospital encounter of 10/05/11 (from the past 48 hour(s))  GLUCOSE, CAPILLARY     Status: Abnormal   Collection Time   10/09/11 12:14 PM      Component Value Range Comment   Glucose-Capillary 129 (*) 70 - 99 mg/dL    Comment 1 Notify RN      Comment 2 Documented in Chart     GLUCOSE, CAPILLARY     Status: Abnormal   Collection Time   10/09/11  4:11 PM      Component Value Range Comment   Glucose-Capillary 62 (*) 70 - 99 mg/dL    Comment 1 Notify RN     GLUCOSE, CAPILLARY     Status: Abnormal   Collection Time   10/09/11  4:48 PM      Component Value Range Comment   Glucose-Capillary 119 (*) 70 - 99 mg/dL    Comment 1 Notify RN     GLUCOSE, CAPILLARY     Status: Abnormal   Collection Time   10/09/11  8:43 PM      Component Value Range Comment   Glucose-Capillary 126 (*) 70 - 99 mg/dL   BASIC METABOLIC PANEL     Status: Abnormal   Collection Time   10/10/11  4:35 AM      Component Value Range Comment   Sodium 134 (*) 135 - 145 mEq/L    Potassium 4.7  3.5 - 5.1  mEq/L    Chloride 99  96 - 112 mEq/L    CO2 27  19 - 32 mEq/L    Glucose, Bld 76  70 - 99 mg/dL  BUN 19  6 - 23 mg/dL    Creatinine, Ser 9.56 (*) 0.50 - 1.35 mg/dL    Calcium 9.1  8.4 - 21.3 mg/dL    GFR calc non Af Amer 53 (*) >90 mL/min    GFR calc Af Amer 62 (*) >90 mL/min   GLUCOSE, CAPILLARY     Status: Normal   Collection Time   10/10/11  6:13 AM      Component Value Range Comment   Glucose-Capillary 99  70 - 99 mg/dL   GLUCOSE, CAPILLARY     Status: Abnormal   Collection Time   10/10/11 10:48 AM      Component Value Range Comment   Glucose-Capillary 101 (*) 70 - 99 mg/dL   GLUCOSE, CAPILLARY     Status: Normal   Collection Time   10/10/11  4:05 PM      Component Value Range Comment   Glucose-Capillary 71  70 - 99 mg/dL   GLUCOSE, CAPILLARY     Status: Abnormal   Collection Time   10/10/11  9:18 PM      Component Value Range Comment   Glucose-Capillary 105 (*) 70 - 99 mg/dL    Comment 1 Notify RN      Comment 2 Documented in Chart     GLUCOSE, CAPILLARY     Status: Abnormal   Collection Time   10/11/11  6:40 AM      Component Value Range Comment   Glucose-Capillary 152 (*) 70 - 99 mg/dL    Comment 1 Notify RN      Comment 2 Documented in Chart     BASIC METABOLIC PANEL     Status: Abnormal   Collection Time   10/11/11  6:48 AM      Component Value Range Comment   Sodium 131 (*) 135 - 145 mEq/L    Potassium 5.7 (*) 3.5 - 5.1 mEq/L    Chloride 94 (*) 96 - 112 mEq/L    CO2 32  19 - 32 mEq/L    Glucose, Bld 122 (*) 70 - 99 mg/dL    BUN 20  6 - 23 mg/dL    Creatinine, Ser 0.86 (*) 0.50 - 1.35 mg/dL    Calcium 9.6  8.4 - 57.8 mg/dL    GFR calc non Af Amer 43 (*) >90 mL/min    GFR calc Af Amer 50 (*) >90 mL/min    Ct Head Wo Contrast  10/10/2011  *RADIOLOGY REPORT*  Clinical Data: Double vision.  Diplopia with bilateral eyelid ptosis.  Evaluate for brain stem infarct.  Possible new onset myasthenia gravis.  CT HEAD WITHOUT CONTRAST  Technique:  Contiguous axial images  were obtained from the base of the skull through the vertex without contrast.  Comparison: Head CT 11/04/2006.  Findings: Image 15 of series 2 demonstrates a relatively well- defined focus of decreased attenuation in the right globus pallidus, favored to represent and old lacunar infarction. Image 19 of series 2 also demonstrates a well-defined focus of low attenuation in the left frontal lobe white matter, likely represents sequela of prior ischemia.  This is new compared to prior study 11/04/2006.  The brainstem is grossly normal in appearance on this examination (evaluation of the brainstem is somewhat limited on CT examination secondary to beam-hardening artifact from the skull base).  The remainder the brain is also unremarkable in appearance, without definite regions of acute/subacute cerebral ischemia, foci of acute intracranial hemorrhage, evidence of focal mass or mass effect, or hydrocephalous.  No acute displaced skull fracture is identified. Visualized paranasal sinuses and mastoids are well pneumatized.  IMPRESSION: 1.  No definite evidence of brain stem infarct on this CT examination. 2.  Well defined foci of decreased attenuation in the left frontal lobe white matter and right globus pallidus are presumably secondary to remote episodes of ischemia.   Original Report Authenticated By: Florencia Reasons, M.D.     Assessment/Plan Acute on chronic heart failure. Neuro changes poss c/w myasthenia gravis. For tunneled pheresis catheter today. Discussed procedure including risks and complications with pt. Labs ok. He ate breakfast this am, will make NPO and plan for this afternoon.  Brayton El PA-C 10/11/2011, 10:42 AM

## 2011-10-11 NOTE — Progress Notes (Signed)
Pt blew VC of 1.35L, NIF -48 cmH2O. Pt showed moderate effort. No adverse reactions.

## 2011-10-11 NOTE — Progress Notes (Signed)
Advanced Heart Failure Rounding Note  Subjective:    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 Cath in 2008 showed left circumflex with 40% stenosis.  He is followed by [facility].    He presented to the ER with SOB, chest tightness.  He says his best friend was shot and killed a couple of weeks ago.  He has been depressed since that time and may have missed some meds.  Weight up 10 pounds from last clinic visit. (Baseline weight has been about 218)  ProBNP 5931.   Placed on milrinone.   Milrinone increased 0.375 earlier in the week but pt became hypotensive therefore cut back to 0.25. Milrinone stopped  10/09/11.    On Friday apparently had some hallucinations with Klonopin so it was stopped. Now better.Cymbalta was restarted. Continues to be difficult with nursing staff demanding more food and calling down to dietary to order more. He has 3 empty fruit cups on his breakfast tray.   Yesterday, evaluated by Neuro due to double vision. CT of head completed with no definite evidence of brain stem infarct. Trial of Metinon 30 mg 5 x daily started. Yesterday received Torsemide 120 mg and Metolazone 2.5 mg.  Weight down 6 pounds. Over night double vision and muscle weakness resolved.   Denies SOB/PND/orthopnea.     Objective:   Weight Range:  Vital Signs:   Temp:  [97.7 F (36.5 C)-99.1 F (37.3 C)] 98.3 F (36.8 C) (09/23 0707) Pulse Rate:  [80-100] 82  (09/23 0707) Resp:  [18-22] 18  (09/23 0707) BP: (114-130)/(68-84) 121/82 mmHg (09/23 0707) SpO2:  [90 %-100 %] 100 % (09/23 0707) Weight:  [222 lb 12.8 oz (101.061 kg)] 222 lb 12.8 oz (101.061 kg) (09/23 0707) Last BM Date: 10/08/11  Weight change: Filed Weights   10/09/11 1345 10/10/11 0449 10/11/11 0707  Weight: 228 lb 6.3 oz (103.6 kg) 226 lb 12.8 oz (102.876 kg) 222 lb 12.8 oz (101.061 kg)    Intake/Output:   Intake/Output Summary (Last 24 hours) at  10/11/11 0843 Last data filed at 10/11/11 0500  Gross per 24 hour  Intake    862 ml  Output   4975 ml  Net  -4113 ml     Physical Exam: General:  Chronically ill appearing, No resp difficulty on 2L HEENT: normal Neck: supple. JVP 7. Carotids 2+ bilat; no bruits. No lymphadenopathy or thryomegaly appreciated. Cor: PMI nondisplaced. Regular rate & rhythm. 2/6 MR, +S3 Lungs: clear Abdomen: soft, nontender, ? mild distention. No hepatosplenomegaly. No bruits or masses. Good bowel sounds. Extremities: no cyanosis, clubbing, rash, edema Neuro: mildly groggy but engages in coversation & orientedx3, cranial nerves grossly intact. No nystagmus. No dysconjugate gaze. moves all 4 extremities w/o difficulty.   Telemetry: Sinus rhythm   Labs: Basic Metabolic Panel:  Lab 10/11/11 3086 10/10/11 0435 10/09/11 0450 10/08/11 0453 10/07/11 0434 10/06/11 0343  NA 131* 134* 136 137 135 --  K 5.7* 4.7 4.0 4.0 4.1 --  CL 94* 99 98 102 98 --  CO2 32 27 26 26 30  --  GLUCOSE 122* 76 129* 111* 112* --  BUN 20 19 20 22  25* --  CREATININE 1.84* 1.54* 1.70* 1.57* 1.85* --  CALCIUM 9.6 9.1 8.8 -- -- --  MG -- -- -- -- -- 2.0  PHOS -- -- -- -- -- --    Liver Function Tests:  Lab 10/06/11 0343 10/05/11 1946  AST 24 23  ALT 19 19  ALKPHOS 72 73  BILITOT 1.9* 1.7*  PROT 6.9 7.1  ALBUMIN 3.3* 3.3*   No results found for this basename: LIPASE:5,AMYLASE:5 in the last 168 hours No results found for this basename: AMMONIA:3 in the last 168 hours  CBC:  Lab 10/06/11 0343 10/05/11 1946  WBC 7.2 7.5  NEUTROABS 4.5 4.8  HGB 14.5 14.8  HCT 41.8 43.3  MCV 83.9 84.6  PLT 214 210    Cardiac Enzymes:  Lab 10/06/11 1524 10/06/11 0907 10/06/11 0344  CKTOTAL -- -- --  CKMB -- -- --  CKMBINDEX -- -- --  TROPONINI <0.30 <0.30 <0.30    BNP: BNP (last 3 results)  Basename 10/05/11 1947 09/03/11 1502 06/13/11 1115  PROBNP 5931.0* 3290.0* 3799.0*    Imaging: Ct Head Wo Contrast  10/10/2011   *RADIOLOGY REPORT*  Clinical Data: Double vision.  Diplopia with bilateral eyelid ptosis.  Evaluate for brain stem infarct.  Possible new onset myasthenia gravis.  CT HEAD WITHOUT CONTRAST  Technique:  Contiguous axial images were obtained from the base of the skull through the vertex without contrast.  Comparison: Head CT 11/04/2006.  Findings: Image 15 of series 2 demonstrates a relatively well- defined focus of decreased attenuation in the right globus pallidus, favored to represent and old lacunar infarction. Image 19 of series 2 also demonstrates a well-defined focus of low attenuation in the left frontal lobe white matter, likely represents sequela of prior ischemia.  This is new compared to prior study 11/04/2006.  The brainstem is grossly normal in appearance on this examination (evaluation of the brainstem is somewhat limited on CT examination secondary to beam-hardening artifact from the skull base).  The remainder the brain is also unremarkable in appearance, without definite regions of acute/subacute cerebral ischemia, foci of acute intracranial hemorrhage, evidence of focal mass or mass effect, or hydrocephalous.  No acute displaced skull fracture is identified. Visualized paranasal sinuses and mastoids are well pneumatized.  IMPRESSION: 1.  No definite evidence of brain stem infarct on this CT examination. 2.  Well defined foci of decreased attenuation in the left frontal lobe white matter and right globus pallidus are presumably secondary to remote episodes of ischemia.   Original Report Authenticated By: Florencia Reasons, M.D.      Medications:     Scheduled Medications:    . therapeutic plasma exchange solution   Dialysis Once in dialysis  . atorvastatin  20 mg Oral q1800  . calcium carbonate  2 tablet Oral Q3H  . carvedilol  3.125 mg Oral BID WC  . clorazepate  15 mg Oral QHS  . clorazepate  7.5 mg Oral Daily  . digoxin  0.25 mg Oral Daily  . DULoxetine  60 mg Oral Daily  .  gabapentin  600 mg Oral TID  . glipiZIDE  10 mg Oral QAC breakfast  . heparin  1,000 Units Intracatheter Once  . hydrALAZINE  25 mg Oral Q8H  . insulin aspart  0-15 Units Subcutaneous TID WC  . insulin glargine  15 Units Subcutaneous QHS  . isosorbide mononitrate  15 mg Oral Daily  . metolazone  2.5 mg Oral Once  . pantoprazole  40 mg Oral Q1200  . pyridostigmine  30 mg Oral 5 X Daily  . senna  1 tablet Oral Daily  . sodium chloride  3 mL Intravenous Q12H  . torsemide  60 mg Oral Daily  . DISCONTD: benazepril  40 mg Oral Daily  .  DISCONTD: hydrALAZINE  10 mg Intravenous TID  . DISCONTD: hydrALAZINE  10 mg Oral TID  . DISCONTD: torsemide  60 mg Oral Daily  . DISCONTD: torsemide  60 mg Oral BID  . DISCONTD: torsemide  60 mg Oral BID    Infusions:    . sodium chloride 12.2 mL/hr (10/09/11 0117)  . citrate dextrose      PRN Medications: sodium chloride, acetaminophen, calcium carbonate, calcium gluconate IVPB, calcium gluconate IVPB, calcium gluconate, diazepam, diphenhydrAMINE, ondansetron (ZOFRAN) IV, oxyCODONE-acetaminophen, sodium chloride, DISCONTD: acetaminophen   Assessment:   1) A/C Systolic HF      -on Hospice  2) NICM, ETOH       -EF 15% (3/13)  3) OSA on CPAP  4) DM2  5) H/o noncompliance  6) A/C renal failure due to cardiorenal syndrome  7) Depression  8) Acute respiratory failure - resolving with diuresis 9) Double vision  Plan/Discussion:    Volume status looks ok. Weight down 6 pounds. Resume home diuretics regimen. Torsemide 60 mg daily. Increase hydralazine 25 mg tid, Imdur 30 daily. Potassium 5.7.  Hold ACEI and repeat potassium level now (resume ACEI if K is ok and this was hemolysis).   Neuro following: think patient has myasthenia gravis (awaiting serologies).  Marked improvement diplopia, ptosis with pyridostigmine.  Discussed with neuro today: would avoid IVIG given volume load, think plasmapheresis would be ok.   Cymbalta restarted for  depression.  CSW following.        Will enroll in paramedicine program at d/c for closer f/u. I have left a message for [employer]   Has follow up in HF clinic next week.  Length of Stay: 6  [name] 10/11/2011

## 2011-10-11 NOTE — Progress Notes (Signed)
NIF 45cm and VC 1.25L.

## 2011-10-11 NOTE — Progress Notes (Signed)
Pt lying in the bed with eyes closed and the phone to his ear.  He did arouse when his name was called.  He was lethargic and did not realize that he had the phone against his ear.  Pt did report that he was having "surgery" today then dozed off.   Reviewed chart.  Last VS were 98.3, 84, 18, 121/82.  Abnormal labs include Na-131, K+ 5.7 (repeat K+ 5.0) Chloride 94, creatinine-1.84.  Pt has been reporting double vision.  He has Bilateral eyelid ptosis.  Suspicious for Myasthenia Gravis.  A tunneled pheresis catheter needs to be placed for pt to receive plasmapheresis.   This is a hospice related admission.  Please call [facility] at [phone] with any pt movement.  Pam PetersonRN, [facility]

## 2011-10-11 NOTE — Progress Notes (Signed)
Pt stated that he fell in room, while getting up to close the door, the fall was unwitnessed, RN did assessment on pt after did not assess any injury from fall, pt stated that he did not have an injury, no s/s, MD notified will continue to monitor, Thanks, Lavonda Jumbo RN

## 2011-10-11 NOTE — Progress Notes (Signed)
Pt stated that his blurry vision and/or double vision has stopped, pt stated he was very tired earlier today and has not got enough sleep in the past 2 weeks it has been very stressful at home, according to pt;  will continue to monitor, Thanks, Lavonda Jumbo RN

## 2011-10-11 NOTE — Progress Notes (Signed)
Subjective: No new complaints. Double vision has resolved. Ptosis of eyelids improved. Patient is feeling more energetic and generally stronger. He has not experienced any side effects of Mestinon, including no gastrointestinal symptoms.  Objective: Current vital signs: BP 117/77  Pulse 78  Temp 98.2 F (36.8 C) (Oral)  Resp 15  Ht 6' (1.829 m)  Wt 101.061 kg (222 lb 12.8 oz)  BMI 30.22 kg/m2  SpO2 97%  Neurologic Exam: Alert and in no acute distress. Mental status was normal. Extraocular movements were full and conjugate. He had no diplopia at rest nor on right and left lateral gaze. Ptosis markedly improved. He had minimal fatiguing of eyelids with upgaze for 30 seconds , and slight diplopia. Strength orbicularis ocular muscles was normal bilaterally. Minimal weakness orbicularis oris muscle. No weakness of neck flexors or extensors.  Lab Results:   Studies/Results: Ct Head Wo Contrast  10/10/2011  *RADIOLOGY REPORT*  Clinical Data: Double vision.  Diplopia with bilateral eyelid ptosis.  Evaluate for brain stem infarct.  Possible new onset myasthenia gravis.  CT HEAD WITHOUT CONTRAST  Technique:  Contiguous axial images were obtained from the base of the skull through the vertex without contrast.  Comparison: Head CT 11/04/2006.  Findings: Image 15 of series 2 demonstrates a relatively well- defined focus of decreased attenuation in the right globus pallidus, favored to represent and old lacunar infarction. Image 19 of series 2 also demonstrates a well-defined focus of low attenuation in the left frontal lobe white matter, likely represents sequela of prior ischemia.  This is new compared to prior study 11/04/2006.  The brainstem is grossly normal in appearance on this examination (evaluation of the brainstem is somewhat limited on CT examination secondary to beam-hardening artifact from the skull base).  The remainder the brain is also unremarkable in appearance, without definite  regions of acute/subacute cerebral ischemia, foci of acute intracranial hemorrhage, evidence of focal mass or mass effect, or hydrocephalous.  No acute displaced skull fracture is identified. Visualized paranasal sinuses and mastoids are well pneumatized.  IMPRESSION: 1.  No definite evidence of brain stem infarct on this CT examination. 2.  Well defined foci of decreased attenuation in the left frontal lobe white matter and right globus pallidus are presumably secondary to remote episodes of ischemia.   Original Report Authenticated By: Florencia Reasons, M.D.    Ir Fluoro Guide Cv Line Right  10/11/2011  *RADIOLOGY REPORT*  IR PLACEMENT OF A TUNNELED HEMODIALYSIS CATHETER WITH ULTRASOUND AND FLUOROSCOPIC GUIDANCE  Date: 10/11/2011  Clinical History: 46 year old male with symptoms of myasthenia gravis.  Tunneled dialysis catheter placement requested for plasmapheresis.  Procedures Performed: 1. Ultrasound-guided puncture of the right internal jugular vein 2.  Placement of a tunneled dual lumen hemodialysis catheter with fluoroscopic guidance  Interventional Radiologist:  Sterling Big, MD  Sedation: Moderate (conscious) sedation was not used.  A total of 50 mcg Fentanyl was administered intravenously.  Fluoroscopy time: 1.1 minutes  Contrast volume: None  PROCEDURE/FINDINGS:   Informed consent was obtained from the patient following explanation of the procedure, risks, benefits and alternatives. The patient understands, agrees and consents for the procedure. All questions were addressed. A time out was performed.  Maximal barrier sterile technique utilized including caps, mask, sterile gowns, sterile gloves, large sterile drape, hand hygiene, and chlorhexidine skin prep.  The right neck was interrogated with ultrasound.  The right internal jugular vein was found be widely patent.  Under direct sonographic guidance, the vessel was punctured with a 21-gauge  micropuncture needle. A small dermatotomy was made  at the venous access site with an 11 blade.  A 5-French transitional dilator was advanced over a micro wire into the superior vena cava.  A 23 cm tip to cuff HemoSplit tunneled hemodialysis catheter was selected.  An appropriate skin exit site inferior to the clavicle was noted.  Local anesthesia was obtained with infiltration of 1% lidocaine. The dermatotomy was made with the 11 blade at the skin exit site.  The hemodialysis catheter was then tunneled in the subcutaneous soft tissues from the skin exit site to the dermatotomy overlying the venous access site.  A peel-away sheath was then advanced over a J wire into the superior vena cava.  The catheter was then advanced through the peel-away sheath and the tip positioned under fluoroscopic guidance in the mid right atrium.  The catheter flushes and aspirates with ease.  It was secured to the skin with 0 Prolene suture.  The dermatotomy overlying the venous access site was closed in layers with a single inverted 4-0 Vicryl suture and epidermis sealed with Dermabond.  The patient tolerated procedure well, there was no immediate complication.  IMPRESSION:  Successful placement of a right IJ approach tunneled hemodialysis catheter for the purpose of plasmapheresis.  The catheter tip is in the mid right atrium and is ready for immediate use.  Signed,  Sterling Big, MD Vascular & Interventional Radiologist [facility]   Original Report Authenticated By: Vilma Prader    Ir US Guide Vasc Access Right  10/11/2011  *RADIOLOGY REPORT*  IR PLACEMENT OF A TUNNELED HEMODIALYSIS CATHETER WITH ULTRASOUND AND FLUOROSCOPIC GUIDANCE  Date: 10/11/2011  Clinical History: 46 year old male with symptoms of myasthenia gravis.  Tunneled dialysis catheter placement requested for plasmapheresis.  Procedures Performed: 1. Ultrasound-guided puncture of the right internal jugular vein 2.  Placement of a tunneled dual lumen hemodialysis catheter with fluoroscopic guidance   Interventional Radiologist:  Sterling Big, MD  Sedation: Moderate (conscious) sedation was not used.  A total of 50 mcg Fentanyl was administered intravenously.  Fluoroscopy time: 1.1 minutes  Contrast volume: None  PROCEDURE/FINDINGS:   Informed consent was obtained from the patient following explanation of the procedure, risks, benefits and alternatives. The patient understands, agrees and consents for the procedure. All questions were addressed. A time out was performed.  Maximal barrier sterile technique utilized including caps, mask, sterile gowns, sterile gloves, large sterile drape, hand hygiene, and chlorhexidine skin prep.  The right neck was interrogated with ultrasound.  The right internal jugular vein was found be widely patent.  Under direct sonographic guidance, the vessel was punctured with a 21-gauge micropuncture needle. A small dermatotomy was made at the venous access site with an 11 blade.  A 5-French transitional dilator was advanced over a micro wire into the superior vena cava.  A 23 cm tip to cuff HemoSplit tunneled hemodialysis catheter was selected.  An appropriate skin exit site inferior to the clavicle was noted.  Local anesthesia was obtained with infiltration of 1% lidocaine. The dermatotomy was made with the 11 blade at the skin exit site.  The hemodialysis catheter was then tunneled in the subcutaneous soft tissues from the skin exit site to the dermatotomy overlying the venous access site.  A peel-away sheath was then advanced over a J wire into the superior vena cava.  The catheter was then advanced through the peel-away sheath and the tip positioned under fluoroscopic guidance in the mid right atrium.  The catheter flushes and  aspirates with ease.  It was secured to the skin with 0 Prolene suture.  The dermatotomy overlying the venous access site was closed in layers with a single inverted 4-0 Vicryl suture and epidermis sealed with Dermabond.  The patient tolerated  procedure well, there was no immediate complication.  IMPRESSION:  Successful placement of a right IJ approach tunneled hemodialysis catheter for the purpose of plasmapheresis.  The catheter tip is in the mid right atrium and is ready for immediate use.  Signed,  Sterling Big, MD Vascular & Interventional Radiologist [facility] Radiology   Original Report Authenticated By: Vilma Prader     Medications:  Scheduled:   . therapeutic plasma exchange solution   Dialysis Once in dialysis  . therapeutic plasma exchange solution   Dialysis Once in dialysis  . therapeutic plasma exchange solution   Dialysis Once in dialysis  . therapeutic plasma exchange solution   Dialysis Once in dialysis  . atorvastatin  20 mg Oral q1800  . benazepril  10 mg Oral Daily  . calcium carbonate  2 tablet Oral Q3H  . carvedilol  3.125 mg Oral BID WC  .  ceFAZolin (ANCEF) IV  2 g Intravenous Once  . clorazepate  15 mg Oral QHS  . clorazepate  7.5 mg Oral Daily  . digoxin  0.25 mg Oral Daily  . DULoxetine  60 mg Oral Daily  . gabapentin  600 mg Oral TID  . glipiZIDE  10 mg Oral QAC breakfast  . heparin  1,000 Units Intracatheter Once  . hydrALAZINE  25 mg Oral Q8H  . insulin aspart  0-15 Units Subcutaneous TID WC  . insulin glargine  15 Units Subcutaneous QHS  . isosorbide mononitrate  30 mg Oral Daily  . pantoprazole  40 mg Oral Q1200  . pyridostigmine  30 mg Oral 5 X Daily  . senna  1 tablet Oral Daily  . sodium chloride  3 mL Intravenous Q12H  . torsemide  60 mg Oral Daily  . DISCONTD: benazepril  40 mg Oral Daily  . DISCONTD: hydrALAZINE  10 mg Intravenous TID  . DISCONTD: hydrALAZINE  10 mg Oral TID  . DISCONTD: isosorbide mononitrate  15 mg Oral Daily  . DISCONTD: torsemide  60 mg Oral BID   EXB:MWUXLK chloride, acetaminophen, calcium carbonate, calcium gluconate IVPB, calcium gluconate IVPB, calcium gluconate, diazepam, diphenhydrAMINE, fentaNYL, ondansetron (ZOFRAN) IV, oxyCODONE-acetaminophen, sodium  chloride, DISCONTD: acetaminophen  Assessment/Plan: Myasthenia gravis with ocular and bulbar weakness, improved significantly with Mestinon. Patient is still showing signs on exam of easy fatiguing, however. He's tolerating Mestinon well at this point. Respiratory status is remaining stable. Hemodialysis catheter has been placed and plasmapheresis we'll start this evening. He is scheduled for 5 exchanges on an every other day schedule. I will plan to do a CT scan of his chest with and without contrast media to rule out thymoma. Ach receptor antibody study results are pending, as are striated muscle antibody results.   C.R. Roseanne Reno, MD [facility] [phone]  10/11/2011  7:27 PM

## 2011-10-11 NOTE — H&P (Signed)
Agree with PA note.  Will proceed with placement of a tunneled HD catheter for pheresis via a right IJ approach as pt has a left subclavian ICD.  Signed,  Sterling Big, MD Vascular & Interventional Radiologist [facility] Radiology

## 2011-10-12 ENCOUNTER — Inpatient Hospital Stay (HOSPITAL_COMMUNITY)

## 2011-10-12 ENCOUNTER — Encounter (HOSPITAL_COMMUNITY): Payer: Self-pay | Admitting: General Practice

## 2011-10-12 DIAGNOSIS — G7000 Myasthenia gravis without (acute) exacerbation: Secondary | ICD-10-CM

## 2011-10-12 HISTORY — PX: INSERTION OF DIALYSIS CATHETER: SHX1324

## 2011-10-12 HISTORY — DX: Myasthenia gravis without (acute) exacerbation: G70.00

## 2011-10-12 LAB — BASIC METABOLIC PANEL
BUN: 21 mg/dL (ref 6–23)
CO2: 30 mEq/L (ref 19–32)
Chloride: 97 mEq/L (ref 96–112)
Creatinine, Ser: 1.65 mg/dL — ABNORMAL HIGH (ref 0.50–1.35)
Creatinine, Ser: 1.76 mg/dL — ABNORMAL HIGH (ref 0.50–1.35)
GFR calc Af Amer: 57 mL/min — ABNORMAL LOW (ref >90)
GFR calc non Af Amer: 49 mL/min — ABNORMAL LOW (ref >90)
Glucose, Bld: 96 mg/dL (ref 70–99)
Sodium: 134 mEq/L — ABNORMAL LOW (ref 135–145)

## 2011-10-12 LAB — GLUCOSE, CAPILLARY
Glucose-Capillary: 153 mg/dL — ABNORMAL HIGH (ref 70–99)
Glucose-Capillary: 243 mg/dL — ABNORMAL HIGH (ref 70–99)
Glucose-Capillary: 199 mg/dL — ABNORMAL HIGH (ref 70–99)

## 2011-10-12 LAB — DIGOXIN LEVEL: Digoxin Level: 1.4 ng/mL (ref 0.8–2.0)

## 2011-10-12 MED ORDER — SODIUM CHLORIDE 0.9 % IV SOLN
Freq: Once | INTRAVENOUS | Status: AC
  Administered 2011-10-13: 12:00:00 via INTRAVENOUS_CENTRAL
  Filled 2011-10-12 (×3): qty 200

## 2011-10-12 MED ORDER — CALCIUM GLUCONATE 10 % IV SOLN
2.0000 g | INTRAVENOUS | Status: DC | PRN
  Filled 2011-10-12: qty 20

## 2011-10-12 MED ORDER — CALCIUM CARBONATE ANTACID 500 MG PO CHEW
2.0000 | CHEWABLE_TABLET | ORAL | Status: AC
  Administered 2011-10-12: 400 mg via ORAL
  Filled 2011-10-12 (×2): qty 2

## 2011-10-12 MED ORDER — ACD FORMULA A 0.73-2.45-2.2 GM/100ML VI SOLN
500.0000 mL | Status: DC
  Administered 2011-10-13 – 2011-10-17 (×3): 500 mL via INTRAVENOUS
  Filled 2011-10-12 (×3): qty 500

## 2011-10-12 MED ORDER — SODIUM CHLORIDE 0.9 % IV SOLN
2.0000 g | INTRAVENOUS | Status: DC | PRN
  Administered 2011-10-15: 2 g via INTRAVENOUS
  Filled 2011-10-12 (×2): qty 20

## 2011-10-12 MED ORDER — CARVEDILOL 6.25 MG PO TABS
6.2500 mg | ORAL_TABLET | Freq: Two times a day (BID) | ORAL | Status: DC
  Administered 2011-10-12 – 2011-10-19 (×14): 6.25 mg via ORAL
  Filled 2011-10-12 (×17): qty 1

## 2011-10-12 MED ORDER — SODIUM CHLORIDE 0.9 % IV SOLN
4.0000 g | INTRAVENOUS | Status: DC | PRN
  Filled 2011-10-12: qty 40

## 2011-10-12 MED ORDER — DIPHENHYDRAMINE HCL 25 MG PO CAPS: 25.0000 mg | ORAL_CAPSULE | Freq: Four times a day (QID) | ORAL | Status: DC | PRN

## 2011-10-12 MED ORDER — PYRIDOSTIGMINE BROMIDE 60 MG PO TABS
60.0000 mg | ORAL_TABLET | Freq: Four times a day (QID) | ORAL | Status: DC
  Administered 2011-10-12 – 2011-10-19 (×27): 60 mg via ORAL
  Filled 2011-10-12 (×32): qty 1

## 2011-10-12 MED ORDER — CALCIUM CARBONATE ANTACID 500 MG PO CHEW
2.0000 | CHEWABLE_TABLET | ORAL | Status: DC | PRN
  Administered 2011-10-13 – 2011-10-19 (×3): 400 mg via ORAL
  Filled 2011-10-12: qty 2

## 2011-10-12 MED ORDER — ACETAMINOPHEN 325 MG PO TABS: 650.0000 mg | ORAL_TABLET | ORAL | Status: DC | PRN

## 2011-10-12 MED ORDER — HEPARIN SODIUM (PORCINE) 1000 UNIT/ML IJ SOLN
1000.0000 [IU] | Freq: Once | INTRAMUSCULAR | Status: AC
  Administered 2011-10-13: 1000 [IU]
  Filled 2011-10-12: qty 1

## 2011-10-12 MED FILL — Heparin Sodium (Porcine) Inj 1000 Unit/ML: INTRAMUSCULAR | Qty: 10 | Status: AC

## 2011-10-12 NOTE — Progress Notes (Signed)
Pt. Performed 1.7L on the Vital capacity and -40 on the NIF.

## 2011-10-12 NOTE — Progress Notes (Signed)
Subjective: Patient has not experienced any diplopia today. He has had no problems with swallowing. There were no problems with his first course of plasmapheresis last night. He is tolerating Mestinon well with no side effects.  Objective: Current vital signs: BP 107/75  Pulse 86  Temp 98.1 F (36.7 C) (Oral)  Resp 17  Ht 6' (1.829 m)  Wt 97.6 kg (215 lb 2.7 oz)  BMI 29.18 kg/m2  SpO2 94%  Neurologic Exam: Somnolent but fairly easy to arouse. Patient is in no distress. Extraocular movements were full and conjugate with no diplopia in any field of gaze. Mild to moderate eyelid ptosis, although patient was also fairly drowsy and fell asleep fairly quickly if not actively engaged. Normal strength of frontalis and orbicularis oculi muscles. Minimal weakness of orbicularis oris.  Lab Results: [facility] receptor and striated muscle antibody results pending.  Medications:  Scheduled:   . therapeutic plasma exchange solution   Dialysis Once in dialysis  . therapeutic plasma exchange solution   Dialysis Once in dialysis  . therapeutic plasma exchange solution   Dialysis Once in dialysis  . therapeutic plasma exchange solution   Dialysis Once in dialysis  . atorvastatin  20 mg Oral q1800  . calcium carbonate  2 tablet Oral Q3H  . carvedilol  6.25 mg Oral BID WC  .  ceFAZolin (ANCEF) IV  2 g Intravenous Once  . citrate dextrose      . clorazepate  15 mg Oral QHS  . clorazepate  7.5 mg Oral Daily  . digoxin  0.25 mg Oral Daily  . DULoxetine  60 mg Oral Daily  . gabapentin  600 mg Oral TID  . glipiZIDE  10 mg Oral QAC breakfast  . heparin  1,000 Units Intracatheter Once  . hydrALAZINE  25 mg Oral Q8H  . insulin aspart  0-15 Units Subcutaneous TID WC  . insulin glargine  15 Units Subcutaneous QHS  . isosorbide mononitrate  30 mg Oral Daily  . pantoprazole  40 mg Oral Q1200  . pyridostigmine  30 mg Oral 5 X Daily  . senna  1 tablet Oral Daily  . sodium chloride  3 mL Intravenous Q12H    . torsemide  60 mg Oral Daily  . DISCONTD: benazepril  10 mg Oral Daily  . DISCONTD: carvedilol  3.125 mg Oral BID WC   WUJ:WJXBJY chloride, acetaminophen, calcium carbonate, calcium gluconate IVPB, calcium gluconate IVPB, calcium gluconate, diazepam, diphenhydrAMINE, fentaNYL, ondansetron (ZOFRAN) IV, oxyCODONE-acetaminophen, sodium chloride  Assessment/Plan: Myasthenia gravis with improvement after starting Mestinon. Is currently undergoing treatment with plasmapheresis and is completed the first of 5 plasma exchanges.  Plan: Plasmapheresis we'll continue as planned. CT of his chest without contrast media has been ordered to rule out thymoma. We'll obtain a contrast study if indicated, but will defer use of contrast for now do to patient's chronic kidney disease. I will change Mestinon schedule to 60 mg every 6 hours. We will continue to follow him closely with you.  C.R. Roseanne Reno, MD [facility] [phone]  10/12/2011  10:58 AM

## 2011-10-12 NOTE — Progress Notes (Addendum)
Patient ID: [name], male   DOB: 1965/10/02, 46 y.o.   MRN: [id number] Advanced Heart Failure Rounding Note  Subjective:    [name] is a 46 y/o male with HTN and DM2 with longstanding HF due to NICM (probably ETOH), EF 20%. Cath in 2008 showed left circumflex with 40% stenosis.  He is followed by [facility].    He presented to the ER with SOB, chest tightness.  He says his best friend was shot and killed a couple of weeks ago.  He has been depressed since that time and may have missed some meds.  Weight up 10 pounds from last clinic visit. (Baseline weight has been about 218)  ProBNP 5931.   Placed on milrinone.   Milrinone increased 0.375 earlier in the week but pt became hypotensive therefore cut back to 0.25. Milrinone stopped  10/09/11.     Evaluated by Neuro due to double vision, ptosis, and generalized weakness. CT of head completed with no definite evidence of brain stem infarct.  Thought to have myasthenia gravis based on good response to pyridostigmine.  He had a tunneled catheter placed and got 1st plasmapheresis last night.  He is feeling stronger and no longer has double vision.   Denies SOB/PND/orthopnea.  Diuresed well on po torsemide, weight down again.  K still high.     Objective:   Weight Range:  Vital Signs:   Temp:  [97 F (36.1 C)-98.2 F (36.8 C)] 98.1 F (36.7 C) (09/24 0629) Pulse Rate:  [67-84] 72  (09/24 0629) Resp:  [15-24] 17  (09/24 0629) BP: (104-126)/(66-77) 110/73 mmHg (09/24 0629) SpO2:  [94 %-100 %] 94 % (09/24 0629) Weight:  [215 lb 2.7 oz (97.6 kg)] 215 lb 2.7 oz (97.6 kg) (09/24 0629) Last BM Date: 10/11/11  Weight change: Filed Weights   10/10/11 0449 10/11/11 0707 10/12/11 0629  Weight: 226 lb 12.8 oz (102.876 kg) 222 lb 12.8 oz (101.061 kg) 215 lb 2.7 oz (97.6 kg)    Intake/Output:   Intake/Output Summary (Last 24 hours) at 10/12/11 0823 Last data filed at 10/12/11 7829  Gross per 24 hour  Intake   1222 ml  Output    5050 ml  Net  -3828 ml     Physical Exam: General:  Chronically ill appearing, No resp difficulty on 2L HEENT: normal Neck: supple. JVP 7. Carotids 2+ bilat; no bruits. No lymphadenopathy or thryomegaly appreciated. Cor: PMI nondisplaced. Regular rate & rhythm. 2/6 MR, +S3 Lungs: clear Abdomen: soft, nontender, ? mild distention. No hepatosplenomegaly. No bruits or masses. Good bowel sounds. Extremities: no cyanosis, clubbing, rash, edema Neuro: mildly groggy but engages in coversation & orientedx3, cranial nerves grossly intact. No nystagmus. No dysconjugate gaze. moves all 4 extremities w/o difficulty.   Telemetry: Sinus rhythm   Labs: Basic Metabolic Panel:  Lab 10/12/11 5621 10/11/11 1025 10/11/11 0648 10/10/11 0435 10/09/11 0450 10/08/11 0453 10/06/11 0343  NA 134* -- 131* 134* 136 137 --  K 5.9* 5.0 5.7* 4.7 4.0 -- --  CL 98 -- 94* 99 98 102 --  CO2 25 -- 32 27 26 26  --  GLUCOSE 96 -- 122* 76 129* 111* --  BUN 21 -- 20 19 20 22  --  CREATININE 1.65* -- 1.84* 1.54* 1.70* 1.57* --  CALCIUM 9.3 -- 9.6 9.1 -- -- --  MG -- -- -- -- -- -- 2.0  PHOS -- -- -- -- -- -- --    Liver Function Tests:  Lab 10/06/11  4010 10/05/11 1946  AST 24 23  ALT 19 19  ALKPHOS 72 73  BILITOT 1.9* 1.7*  PROT 6.9 7.1  ALBUMIN 3.3* 3.3*   No results found for this basename: LIPASE:5,AMYLASE:5 in the last 168 hours No results found for this basename: AMMONIA:3 in the last 168 hours  CBC:  Lab 10/11/11 1516 10/06/11 0343 10/05/11 1946  WBC 4.9 7.2 7.5  NEUTROABS -- 4.5 4.8  HGB 16.2 14.5 14.8  HCT 46.7 41.8 43.3  MCV 84.0 83.9 84.6  PLT 252 214 210    Cardiac Enzymes:  Lab 10/06/11 1524 10/06/11 0907 10/06/11 0344  CKTOTAL -- -- --  CKMB -- -- --  CKMBINDEX -- -- --  TROPONINI <0.30 <0.30 <0.30    BNP: BNP (last 3 results)  Basename 10/05/11 1947 09/03/11 1502 06/13/11 1115  PROBNP 5931.0* 3290.0* 3799.0*    Imaging: Ct Head Wo Contrast  10/10/2011  *RADIOLOGY REPORT*   Clinical Data: Double vision.  Diplopia with bilateral eyelid ptosis.  Evaluate for brain stem infarct.  Possible new onset myasthenia gravis.  CT HEAD WITHOUT CONTRAST  Technique:  Contiguous axial images were obtained from the base of the skull through the vertex without contrast.  Comparison: Head CT 11/04/2006.  Findings: Image 15 of series 2 demonstrates a relatively well- defined focus of decreased attenuation in the right globus pallidus, favored to represent and old lacunar infarction. Image 19 of series 2 also demonstrates a well-defined focus of low attenuation in the left frontal lobe white matter, likely represents sequela of prior ischemia.  This is new compared to prior study 11/04/2006.  The brainstem is grossly normal in appearance on this examination (evaluation of the brainstem is somewhat limited on CT examination secondary to beam-hardening artifact from the skull base).  The remainder the brain is also unremarkable in appearance, without definite regions of acute/subacute cerebral ischemia, foci of acute intracranial hemorrhage, evidence of focal mass or mass effect, or hydrocephalous.  No acute displaced skull fracture is identified. Visualized paranasal sinuses and mastoids are well pneumatized.  IMPRESSION: 1.  No definite evidence of brain stem infarct on this CT examination. 2.  Well defined foci of decreased attenuation in the left frontal lobe white matter and right globus pallidus are presumably secondary to remote episodes of ischemia.   Original Report Authenticated By: Florencia Reasons, M.D.    Ir Fluoro Guide Cv Line Right  10/11/2011  *RADIOLOGY REPORT*  IR PLACEMENT OF A TUNNELED HEMODIALYSIS CATHETER WITH ULTRASOUND AND FLUOROSCOPIC GUIDANCE  Date: 10/11/2011  Clinical History: 46 year old male with symptoms of myasthenia gravis.  Tunneled dialysis catheter placement requested for plasmapheresis.  Procedures Performed: 1. Ultrasound-guided puncture of the right internal  jugular vein 2.  Placement of a tunneled dual lumen hemodialysis catheter with fluoroscopic guidance  Interventional Radiologist:  Sterling Big, MD  Sedation: Moderate (conscious) sedation was not used.  A total of 50 mcg Fentanyl was administered intravenously.  Fluoroscopy time: 1.1 minutes  Contrast volume: None  PROCEDURE/FINDINGS:   Informed consent was obtained from the patient following explanation of the procedure, risks, benefits and alternatives. The patient understands, agrees and consents for the procedure. All questions were addressed. A time out was performed.  Maximal barrier sterile technique utilized including caps, mask, sterile gowns, sterile gloves, large sterile drape, hand hygiene, and chlorhexidine skin prep.  The right neck was interrogated with ultrasound.  The right internal jugular vein was found be widely patent.  Under direct sonographic guidance, the vessel was  punctured with a 21-gauge micropuncture needle. A small dermatotomy was made at the venous access site with an 11 blade.  A 5-French transitional dilator was advanced over a micro wire into the superior vena cava.  A 23 cm tip to cuff HemoSplit tunneled hemodialysis catheter was selected.  An appropriate skin exit site inferior to the clavicle was noted.  Local anesthesia was obtained with infiltration of 1% lidocaine. The dermatotomy was made with the 11 blade at the skin exit site.  The hemodialysis catheter was then tunneled in the subcutaneous soft tissues from the skin exit site to the dermatotomy overlying the venous access site.  A peel-away sheath was then advanced over a J wire into the superior vena cava.  The catheter was then advanced through the peel-away sheath and the tip positioned under fluoroscopic guidance in the mid right atrium.  The catheter flushes and aspirates with ease.  It was secured to the skin with 0 Prolene suture.  The dermatotomy overlying the venous access site was closed in layers with a  single inverted 4-0 Vicryl suture and epidermis sealed with Dermabond.  The patient tolerated procedure well, there was no immediate complication.  IMPRESSION:  Successful placement of a right IJ approach tunneled hemodialysis catheter for the purpose of plasmapheresis.  The catheter tip is in the mid right atrium and is ready for immediate use.  Signed,  Sterling Big, MD Vascular & Interventional Radiologist [facility]   Original Report Authenticated By: Vilma Prader    Ir US Guide Vasc Access Right  10/11/2011  *RADIOLOGY REPORT*  IR PLACEMENT OF A TUNNELED HEMODIALYSIS CATHETER WITH ULTRASOUND AND FLUOROSCOPIC GUIDANCE  Date: 10/11/2011  Clinical History: 46 year old male with symptoms of myasthenia gravis.  Tunneled dialysis catheter placement requested for plasmapheresis.  Procedures Performed: 1. Ultrasound-guided puncture of the right internal jugular vein 2.  Placement of a tunneled dual lumen hemodialysis catheter with fluoroscopic guidance  Interventional Radiologist:  Sterling Big, MD  Sedation: Moderate (conscious) sedation was not used.  A total of 50 mcg Fentanyl was administered intravenously.  Fluoroscopy time: 1.1 minutes  Contrast volume: None  PROCEDURE/FINDINGS:   Informed consent was obtained from the patient following explanation of the procedure, risks, benefits and alternatives. The patient understands, agrees and consents for the procedure. All questions were addressed. A time out was performed.  Maximal barrier sterile technique utilized including caps, mask, sterile gowns, sterile gloves, large sterile drape, hand hygiene, and chlorhexidine skin prep.  The right neck was interrogated with ultrasound.  The right internal jugular vein was found be widely patent.  Under direct sonographic guidance, the vessel was punctured with a 21-gauge micropuncture needle. A small dermatotomy was made at the venous access site with an 11 blade.  A 5-French transitional dilator was  advanced over a micro wire into the superior vena cava.  A 23 cm tip to cuff HemoSplit tunneled hemodialysis catheter was selected.  An appropriate skin exit site inferior to the clavicle was noted.  Local anesthesia was obtained with infiltration of 1% lidocaine. The dermatotomy was made with the 11 blade at the skin exit site.  The hemodialysis catheter was then tunneled in the subcutaneous soft tissues from the skin exit site to the dermatotomy overlying the venous access site.  A peel-away sheath was then advanced over a J wire into the superior vena cava.  The catheter was then advanced through the peel-away sheath and the tip positioned under fluoroscopic guidance in the mid right atrium.  The catheter flushes and aspirates with ease.  It was secured to the skin with 0 Prolene suture.  The dermatotomy overlying the venous access site was closed in layers with a single inverted 4-0 Vicryl suture and epidermis sealed with Dermabond.  The patient tolerated procedure well, there was no immediate complication.  IMPRESSION:  Successful placement of a right IJ approach tunneled hemodialysis catheter for the purpose of plasmapheresis.  The catheter tip is in the mid right atrium and is ready for immediate use.  Signed,  Sterling Big, MD Vascular & Interventional Radiologist [facility]   Original Report Authenticated By: Vilma Prader      Medications:     Scheduled Medications:    . therapeutic plasma exchange solution   Dialysis Once in dialysis  . therapeutic plasma exchange solution   Dialysis Once in dialysis  . therapeutic plasma exchange solution   Dialysis Once in dialysis  . therapeutic plasma exchange solution   Dialysis Once in dialysis  . atorvastatin  20 mg Oral q1800  . calcium carbonate  2 tablet Oral Q3H  . carvedilol  3.125 mg Oral BID WC  .  ceFAZolin (ANCEF) IV  2 g Intravenous Once  . citrate dextrose      . clorazepate  15 mg Oral QHS  . clorazepate  7.5 mg Oral Daily    . digoxin  0.25 mg Oral Daily  . DULoxetine  60 mg Oral Daily  . gabapentin  600 mg Oral TID  . glipiZIDE  10 mg Oral QAC breakfast  . heparin  1,000 Units Intracatheter Once  . hydrALAZINE  25 mg Oral Q8H  . insulin aspart  0-15 Units Subcutaneous TID WC  . insulin glargine  15 Units Subcutaneous QHS  . isosorbide mononitrate  30 mg Oral Daily  . pantoprazole  40 mg Oral Q1200  . pyridostigmine  30 mg Oral 5 X Daily  . senna  1 tablet Oral Daily  . sodium chloride  3 mL Intravenous Q12H  . torsemide  60 mg Oral Daily  . DISCONTD: benazepril  10 mg Oral Daily  . DISCONTD: benazepril  40 mg Oral Daily  . DISCONTD: isosorbide mononitrate  15 mg Oral Daily    Infusions:    . sodium chloride 12.2 mL/hr (10/09/11 0117)  . citrate dextrose      PRN Medications: sodium chloride, acetaminophen, calcium carbonate, calcium gluconate IVPB, calcium gluconate IVPB, calcium gluconate, diazepam, diphenhydrAMINE, fentaNYL, ondansetron (ZOFRAN) IV, oxyCODONE-acetaminophen, sodium chloride   Assessment:   1) A/C Systolic HF      -on Hospice  2) NICM, ETOH       -EF 15% (3/13)  3) OSA on CPAP  4) DM2  5) H/o noncompliance  6) A/C renal failure due to cardiorenal syndrome  7) Depression  8) Acute respiratory failure - resolved with diuresis 9) Myasthenia gravis  Plan/Discussion:    Volume status looks ok. Weight down again. Continue torsemide 60 mg daily which will be his home dose. Continue current hydralazine/Imdur.  I am going to increase Coreg to 6.25 mg bid.  K is high again today despite significant diuresis.  I will stop ACEI for now and repeat K later today.  Creatinine improved compared to yesterday.  Check digoxin level.   Neuro following: think patient has myasthenia gravis (awaiting serologies).  Marked improvement diplopia, ptosis with pyridostigmine.  Still some weakness.  He had plasmapheresis last night, will get again tomorrow.   I note that neuro plans on  CT to assess  for thymoma.  Would avoid IV contrast given CKD.   Will enroll in paramedicine program at d/c for closer f/u.   Will have PT walk him today.   Has follow up in HF clinic next week.  Length of Stay: 7  [name] 10/12/2011

## 2011-10-12 NOTE — Progress Notes (Signed)
Rm [id number]   [facility] SW Note:  Pt was able to hold a brief conversation although he intermittently nodded off. He denied pain but reported feeling very tired. Pt expressed that he was not only physically tired but also tired of being sick and his financial situation. Actively listened as pt vented his frustration. Pt called his spouse during the visit and had Sw speak with her. She desired to know more about pt's course of treatment. Sw redirected her back to pt. Pt expressed that he does not want her to know everything because she "goes overboard". Reassured pt of continued hospice support.  Sw will continue to follow to extend support.Althia Forts, LCSW

## 2011-10-12 NOTE — Progress Notes (Signed)
Pt in the bathroom when arrived.  He was able to walk back to the bed on own.  Pt complained that he now has a bed alarm and they will not let him get up on his own.  Denies side effects with new treatment. ? Myasthenia gravis. Denied double vision for "two days" adding since starting no the new medication.   Reviewed chart.  Plasmapheresis started last night.  Pt to receive another treatment today.   Pt had been admitted for Dyspnea and acute on chronic CHF.  Pt under hospice services for Cardiomyopathy with related CHF, and dyspnea.  Original admitting  Dx related to Hospice diagnosis.  Please contact [facility] at [phone] with any Pt movements.  Pam PetersonRN, [facility]

## 2011-10-12 NOTE — Progress Notes (Signed)
PA notified due to patient being upset and wanting me to call the doctor to see if he was able to ambulate with out assistance. Patient called his wife on Sunday stating that he has fallen during this admission. The fall has been documented. Patient was extremely furious with me for placing the bed alarm on my entire shift for his safety. PA agreed to me following protocol.

## 2011-10-13 ENCOUNTER — Telehealth (HOSPITAL_COMMUNITY): Payer: Self-pay | Admitting: *Deleted

## 2011-10-13 ENCOUNTER — Inpatient Hospital Stay (HOSPITAL_COMMUNITY)

## 2011-10-13 DIAGNOSIS — N189 Chronic kidney disease, unspecified: Secondary | ICD-10-CM

## 2011-10-13 LAB — BASIC METABOLIC PANEL
Calcium: 9.7 mg/dL (ref 8.4–10.5)
Creatinine, Ser: 1.71 mg/dL — ABNORMAL HIGH (ref 0.50–1.35)
GFR calc Af Amer: 54 mL/min — ABNORMAL LOW (ref >90)
GFR calc non Af Amer: 47 mL/min — ABNORMAL LOW (ref >90)

## 2011-10-13 LAB — STRIATED MUSCLE ANTIBODY: Striated Muscle Ab: <1:40 {titer}

## 2011-10-13 LAB — GLUCOSE, CAPILLARY
Glucose-Capillary: 218 mg/dL — ABNORMAL HIGH (ref 70–99)
Glucose-Capillary: 146 mg/dL — ABNORMAL HIGH (ref 70–99)

## 2011-10-13 MED ORDER — ACD FORMULA A 0.73-2.45-2.2 GM/100ML VI SOLN
Status: AC
  Administered 2011-10-13: 500 mL via INTRAVENOUS
  Filled 2011-10-13: qty 500

## 2011-10-13 MED ORDER — GUAIFENESIN-DM 100-10 MG/5ML PO SYRP
5.0000 mL | ORAL_SOLUTION | ORAL | Status: DC | PRN
  Administered 2011-10-13 – 2011-10-16 (×5): 5 mL via ORAL
  Filled 2011-10-13 (×5): qty 5

## 2011-10-13 MED ORDER — CALCIUM CARBONATE ANTACID 500 MG PO CHEW
CHEWABLE_TABLET | ORAL | Status: AC
  Filled 2011-10-13: qty 2

## 2011-10-13 MED FILL — Sodium Chloride IV Soln 0.9%: INTRAVENOUS | Qty: 2000 | Status: AC

## 2011-10-13 MED FILL — Albumin, Human Inj 25%: INTRAVENOUS | Qty: 400 | Status: AC

## 2011-10-13 NOTE — Evaluation (Signed)
Physical Therapy Evaluation Patient Details Name: ELON EOFF MRN: [id number] DOB: 08-10-1965 Today's Date: 10/13/2011 Time: 4098-1191 PT Time Calculation (min): 13 min  PT Assessment / Plan / Recommendation Clinical Impression  patient modified independent to independent with all mobility.  No balance deficits noted.  No further PT needs identified.  will sign off.    PT Assessment  Patent does not need any further PT services    Follow Up Recommendations  No PT follow up    Barriers to Discharge        Equipment Recommendations  None recommended by PT    Recommendations for Other Services     Frequency      Precautions / Restrictions Precautions Precautions: Fall Precaution Comments: per chart; EF 20-25%   Pertinent Vitals/Pain No pain indicated      Mobility  Bed Mobility Bed Mobility: Supine to Sit Supine to Sit: 6: Modified independent (Device/Increase time);With rails;HOB flat Transfers Transfers: Sit to Stand;Stand to Sit Sit to Stand: 7: Independent;With upper extremity assist Stand to Sit: 7: Independent;Without upper extremity assist Ambulation/Gait Ambulation/Gait Assistance: 7: Independent Ambulation Distance (Feet): 100 Feet Assistive device: None Gait Pattern: Within Functional Limits Gait velocity: decreased    Shoulder Instructions     Exercises     PT Diagnosis:    PT Problem List:   PT Treatment Interventions:     PT Goals    Visit Information  Last PT Received On: 10/13/11 Assistance Needed: +1    Subjective Data  Subjective: Patient asking for a new lunch tray (RN notified) Patient Stated Goal: Get rid of these alarms   Prior Functioning  Home Living Lives With: Family Available Help at Discharge: Family Type of Home: House Home Layout: One level Home Adaptive Equipment: None Prior Function Level of Independence: Independent Able to Take Stairs?: Yes Communication Communication: No difficulties    Cognition  Overall Cognitive Status: Appears within functional limits for tasks assessed/performed Arousal/Alertness: Awake/alert Orientation Level: Appears intact for tasks assessed Behavior During Session: [facility] for tasks performed    Extremity/Trunk Assessment Right Upper Extremity Assessment RUE ROM/Strength/Tone: [facility] for tasks assessed Left Upper Extremity Assessment LUE ROM/Strength/Tone: WFL for tasks assessed Right Lower Extremity Assessment RLE ROM/Strength/Tone: [facility] for tasks assessed Left Lower Extremity Assessment LLE ROM/Strength/Tone: [facility] for tasks assessed   Balance Balance Balance Assessed: No (no obvious balance deficits noted) High Level Balance High Level Balance Comments: did alternate stepping with no loss of balance  End of Session PT - End of Session Equipment Utilized During Treatment: Gait belt Activity Tolerance: Patient tolerated treatment well Patient left: in bed;with bed alarm set;with call bell/phone within reach  GP     Olivia Canter, [place] [phone] 10/13/2011, 2:33 PM

## 2011-10-13 NOTE — Progress Notes (Signed)
Did not give insulin coverage for dinner, d/t pt eat his lunch late (1620) d/t being in plasmaP./dialys earlier. After eating Pt CBG was 214. (CBG was not fasting). Will continue to monitor

## 2011-10-13 NOTE — Progress Notes (Signed)
[facility] PROGRESS NOTE    SUBJECTIVE   Patient has no complaints. No diplopia, blurred vision, weakness or SOB.  Feeling stronger. No diarrhea or excessive salivation. Tolerating Mestinon well.   OBJECTIVE   Vital signs in last 24 hours: Temp:  [97.6 F (36.4 C)-98.4 F (36.9 C)] 97.8 F (36.6 C) (09/25 0615) Pulse Rate:  [72-83] 72  (09/25 0926) Resp:  [16-18] 18  (09/25 0926) BP: (113-123)/(74-78) 116/75 mmHg (09/25 0926) SpO2:  [98 %-100 %] 100 % (09/25 0926) Weight:  [95.255 kg (210 lb)] 95.255 kg (210 lb) (09/25 0615)  Intake/Output from previous day: 09/24 0701 - 09/25 0700 In: 1183 [P.O.:1180; I.V.:3] Out: 5500 [Urine:5500] Intake/Output this shift:   Nutritional status: Cardiac  Past Medical History  Diagnosis Date  . Hypertension   . 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 History of peptic ulcer disease     EGD 09/2007 shows prox gastric ulcer with black eschar, treated with PPI  . Depression   . History of alcohol abuse     quit approx 2011-2012  . Angina   . Shortness of breath   . OSA (obstructive sleep apnea)     sleeps with oxygen  . Cardiomyopathy   . ICD (implantable cardiac defibrillator) in place   . Diabetes mellitus     insulin dependent  . GERD (gastroesophageal reflux disease)   . Headache      Neurologic Exam:  Mental Status: Alert, oriented X 3.  Speech fluent without evidence of aphasia. Able to follow 3 step commands without difficulty. Thought content appropriate Cranial Nerves: II-Visual fields grossly intact. III/IV/VI-Extraocular movements intact.  Pupils reactive bilaterally. Ptosis not present. Able to hold eyelids open without difficulty.  V/VII-Smile symmetric VIII-grossly intact IX/X-normal gag XI-bilateral shoulder shrug XII-midline tongue extension Motor: 5/5  bilaterally with normal tone and bulk Sensory: Pinprick and light touch intact throughout, bilaterally Deep Tendon Reflexes:  2+ throughout     Plantars:      Right:  downgoing     Left:  Downgoing Cerebellar: Normal finger-to-nose,  normal heel-to-shin test.      Lab Results: Lab Results  Component Value Date/Time   CHOL 166 09/15/2011  4:33 PM   Lipid Panel No results found for this basename: CHOL,TRIG,HDL,CHOLHDL,VLDL,LDLCALC in the last 72 hours  Studies/Results: Ct Chest Wo Contrast  10/12/2011  *RADIOLOGY REPORT*  Clinical Data: New onset myasthenia gravis, evaluate for thymoma  CT CHEST WITHOUT CONTRAST  Technique:  Multidetector CT imaging of the chest was performed following the standard protocol without IV contrast.  Comparison: Chest radiograph dated 10/05/2011.  CT chest dated 05/13/2010.  Findings: Very mild patchy/tree-in-bud opacity in the left lower lobe (series 3/image 34), likely infectious/inflammatory.  Mild nodularity in the superior segment right lower lobe measuring up to 4 mm (series 3/image 27).  No pleural effusion or pneumothorax.  Visualized thyroid is mildly heterogeneous.  No evidence of anterior mediastinal mass.  Borderline enlarged prevascular/mediastinal lymph nodes, grossly unchanged, including: --1.1 cm short-axis left paratracheal node (series 2/image 15) --1.0 cm short-axis prevascular node (series 2/image 24) --1.0 cm short-axis precarinal node (series 2/image 25) --1.3 cm short-axis subcarinal node (series 2/image  31)  The heart is top normal in size.  No pericardial effusion.  Left subclavian ICD.  Right IJ venous catheter.  Visualized upper abdomen is unremarkable.  Visualized osseous structures are within normal limits.  IMPRESSION: No evidence of anterior mediastinal mass or thymoma.  Borderline enlarged prevascular/mediastinal lymph nodes, unchanged from 2012, likely reactive.  4 mm right lower lobe nodule, possibly infectious/inflammatory.  If this  patient is high risk for primary bronchogenic neoplasm, consider a single follow-up CT in 12 months.  This recommendation follows the consensus statement: Guidelines for Management of Small Pulmonary Nodules Detected on CT Scans: A Statement from the Fleischner Society as published in Radiology 2005; 237:395-400.   Original Report Authenticated By: Charline Bills, M.D.    Ir Fluoro Guide Cv Line Right  10/11/2011  *RADIOLOGY REPORT*  IR PLACEMENT OF A TUNNELED HEMODIALYSIS CATHETER WITH ULTRASOUND AND FLUOROSCOPIC GUIDANCE  Date: 10/11/2011  Clinical History: 46 year old male with symptoms of myasthenia gravis.  Tunneled dialysis catheter placement requested for plasmapheresis.  Procedures Performed: 1. Ultrasound-guided puncture of the right internal jugular vein 2.  Placement of a tunneled dual lumen hemodialysis catheter with fluoroscopic guidance  Interventional Radiologist:  Sterling Big, MD  Sedation: Moderate (conscious) sedation was not used.  A total of 50 mcg Fentanyl was administered intravenously.  Fluoroscopy time: 1.1 minutes  Contrast volume: None  PROCEDURE/FINDINGS:   Informed consent was obtained from the patient following explanation of the procedure, risks, benefits and alternatives. The patient understands, agrees and consents for the procedure. All questions were addressed. A time out was performed.  Maximal barrier sterile technique utilized including caps, mask, sterile gowns, sterile gloves, large sterile drape, hand hygiene, and chlorhexidine skin prep.  The right neck was interrogated with ultrasound.  The right internal jugular vein was found be widely patent.  Under direct sonographic guidance, the vessel was punctured with a 21-gauge micropuncture needle. A small dermatotomy was made at the venous access site with an 11 blade.  A 5-French transitional dilator was advanced over a micro wire into the superior vena cava.  A 23 cm tip to cuff HemoSplit tunneled hemodialysis  catheter was selected.  An appropriate skin exit site inferior to the clavicle was noted.  Local anesthesia was obtained with infiltration of 1% lidocaine. The dermatotomy was made with the 11 blade at the skin exit site.  The hemodialysis catheter was then tunneled in the subcutaneous soft tissues from the skin exit site to the dermatotomy overlying the venous access site.  A peel-away sheath was then advanced over a J wire into the superior vena cava.  The catheter was then advanced through the peel-away sheath and the tip positioned under fluoroscopic guidance in the mid right atrium.  The catheter flushes and aspirates with ease.  It was secured to the skin with 0 Prolene suture.  The dermatotomy overlying the venous access site was closed in layers with a single inverted 4-0 Vicryl suture and epidermis sealed with Dermabond.  The patient tolerated procedure well, there was no immediate complication.  IMPRESSION:  Successful placement of a right IJ approach tunneled hemodialysis catheter for the purpose of plasmapheresis.  The catheter tip is in the mid right atrium and is ready for immediate use.  Signed,  Sterling Big, MD Vascular & Interventional Radiologist [facility] Radiology   Original Report Authenticated By: Vilma Prader    Ir US Guide Vasc Access Right  10/11/2011  *RADIOLOGY REPORT*  IR PLACEMENT OF A TUNNELED HEMODIALYSIS CATHETER WITH ULTRASOUND  AND FLUOROSCOPIC GUIDANCE  Date: 10/11/2011  Clinical History: 46 year old male with symptoms of myasthenia gravis.  Tunneled dialysis catheter placement requested for plasmapheresis.  Procedures Performed: 1. Ultrasound-guided puncture of the right internal jugular vein 2.  Placement of a tunneled dual lumen hemodialysis catheter with fluoroscopic guidance  Interventional Radiologist:  Sterling Big, MD  Sedation: Moderate (conscious) sedation was not used.  A total of 50 mcg Fentanyl was administered intravenously.  Fluoroscopy time: 1.1 minutes   Contrast volume: None  PROCEDURE/FINDINGS:   Informed consent was obtained from the patient following explanation of the procedure, risks, benefits and alternatives. The patient understands, agrees and consents for the procedure. All questions were addressed. A time out was performed.  Maximal barrier sterile technique utilized including caps, mask, sterile gowns, sterile gloves, large sterile drape, hand hygiene, and chlorhexidine skin prep.  The right neck was interrogated with ultrasound.  The right internal jugular vein was found be widely patent.  Under direct sonographic guidance, the vessel was punctured with a 21-gauge micropuncture needle. A small dermatotomy was made at the venous access site with an 11 blade.  A 5-French transitional dilator was advanced over a micro wire into the superior vena cava.  A 23 cm tip to cuff HemoSplit tunneled hemodialysis catheter was selected.  An appropriate skin exit site inferior to the clavicle was noted.  Local anesthesia was obtained with infiltration of 1% lidocaine. The dermatotomy was made with the 11 blade at the skin exit site.  The hemodialysis catheter was then tunneled in the subcutaneous soft tissues from the skin exit site to the dermatotomy overlying the venous access site.  A peel-away sheath was then advanced over a J wire into the superior vena cava.  The catheter was then advanced through the peel-away sheath and the tip positioned under fluoroscopic guidance in the mid right atrium.  The catheter flushes and aspirates with ease.  It was secured to the skin with 0 Prolene suture.  The dermatotomy overlying the venous access site was closed in layers with a single inverted 4-0 Vicryl suture and epidermis sealed with Dermabond.  The patient tolerated procedure well, there was no immediate complication.  IMPRESSION:  Successful placement of a right IJ approach tunneled hemodialysis catheter for the purpose of plasmapheresis.  The catheter tip is in the mid  right atrium and is ready for immediate use.  Signed,  Sterling Big, MD Vascular & Interventional Radiologist [facility] Radiology   Original Report Authenticated By: Vilma Prader     Medications:     Scheduled:   . therapeutic plasma exchange solution   Dialysis Once in dialysis  . therapeutic plasma exchange solution   Dialysis Once in dialysis  . therapeutic plasma exchange solution   Dialysis Once in dialysis  . therapeutic plasma exchange solution   Dialysis Once in dialysis  . atorvastatin  20 mg Oral q1800  . calcium carbonate  2 tablet Oral Q3H  . carvedilol  6.25 mg Oral BID WC  . citrate dextrose      . clorazepate  15 mg Oral QHS  . clorazepate  7.5 mg Oral Daily  . digoxin  0.25 mg Oral Daily  . DULoxetine  60 mg Oral Daily  . gabapentin  600 mg Oral TID  . glipiZIDE  10 mg Oral QAC breakfast  . heparin  1,000 Units Intracatheter Once  . heparin  1,000 Units Intracatheter Once  . hydrALAZINE  25 mg Oral Q8H  . insulin aspart  0-15  Units Subcutaneous TID WC  . insulin glargine  15 Units Subcutaneous QHS  . isosorbide mononitrate  30 mg Oral Daily  . pantoprazole  40 mg Oral Q1200  . pyridostigmine  60 mg Oral Q6H  . senna  1 tablet Oral Daily  . sodium chloride  3 mL Intravenous Q12H  . torsemide  60 mg Oral Daily  . DISCONTD: pyridostigmine  30 mg Oral 5 X Daily    Assessment/Plan:    Patient Active Hospital Problem List:  Myasthenia gravis with improvement after starting Mestinon. Is currently undergoing treatment with plasmapheresis and is completed the first of 5 plasma exchanges next planned for today (will be PLX #2). CT chest shows no thymoma.  Plan:  Plasmapheresis we'll continue as planned.  Will continue Mestinon schedule to 60 mg every 6 hours.  We will continue to follow him closely with you.   Felicie Morn PA-C [facility] [phone]  10/13/2011, 10:21 AM

## 2011-10-13 NOTE — Progress Notes (Signed)
Room 4714 - [name] -  [facility] RN Visit  BEGINNING 9/22 THIS ADMISSION IS NOT RELATED TO THE [facility] DIAGNOSIS OF ALCOHOLIC CARDIOMYOPATHY.  PT WAS DUE TO BE DISCHARGED 9/21 FROM ADMISSION OF CHEST TIGHTNESS, sob , CHEST PAIN.   9/22 - PT WAS DIAGNOSED AND WORKED UP AND IS RECEIVING TREATMENTS FOR MYASTHENIA GRAVIS.     Pt is FULL code.    Pt alert & oriented, sittng in bed, with no complaints of pain or discomfort.  Pt has continuing complaints of food offerings on his trays - this am, states he wanted fruit cup and didn't get it - although grapes were on tray.  Pt's aunt was present.  Pt states there are no plans for discharge at this time.   Please call [facility] @ [phone] with any hospice needs.  Thank you.  Joneen Boers, RN  [facility]  Hospice Liaison
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
## 2011-10-14 LAB — BASIC METABOLIC PANEL
BUN: 27 mg/dL — ABNORMAL HIGH (ref 6–23)
Chloride: 98 mEq/L (ref 96–112)
GFR calc Af Amer: 48 mL/min — ABNORMAL LOW (ref >90)
GFR calc non Af Amer: 42 mL/min — ABNORMAL LOW (ref >90)
Potassium: 5 mEq/L (ref 3.5–5.1)
Sodium: 138 mEq/L (ref 135–145)

## 2011-10-14 LAB — GLUCOSE, CAPILLARY
Glucose-Capillary: 132 mg/dL — ABNORMAL HIGH (ref 70–99)
Glucose-Capillary: 169 mg/dL — ABNORMAL HIGH (ref 70–99)
Glucose-Capillary: 158 mg/dL — ABNORMAL HIGH (ref 70–99)

## 2011-10-14 LAB — ACETYLCHOLINE RECEPTOR, BINDING: Acetylcholine Receptor Ab: <0.3 nmol/L (ref <=0.30)

## 2011-10-14 MED ORDER — DIGOXIN 125 MCG PO TABS
0.1250 mg | ORAL_TABLET | Freq: Every day | ORAL | Status: DC
  Administered 2011-10-16 – 2011-10-19 (×4): 0.125 mg via ORAL
  Filled 2011-10-14 (×4): qty 1

## 2011-10-14 NOTE — Progress Notes (Signed)
Room 4714 - [name] -  [facility] RN Visit  NON -Related admission to [facility] diagnosis of alcoholic cardiomyopathy.  Pt is DNR code.    Pt asleep in lounge chair and did not respond or awaken to voice stimuli.   When turning off TV pt awoke and was agitated that "I turned off his movie he was watching (game show on TV at the time).  Pt stated he did not know lunch was there - pulled his tray toward him, sat up erect in lounge chair and begin lunch and refused to answer any further questions of this RN.  No family present.    Please call [facility] @ [phone] with any hospice needs.  Thank you.  Joneen Boers, RN  [facility]  Hospice Liaison

## 2011-10-14 NOTE — Progress Notes (Signed)
Pt. Called to speak with charge nurse.  Pt. Stated that he couldn't sleep with a light on and refused to have the bed alarm on.  He states that he knew he fell the other day but that the physical therapist "cleared me" and he didn't need those anymore.  He was aware that we could not observe the patient with the lights off on the camera and still refused a bed alarm.  Pt. Stated that he would "tear all the bandages off and leave out of here" if we put the bed alarm on and "as soon as you leave I will turn the lights off and get back in bed."  Pt. Agreed to turn the light on over his bed if he needed to get out of bed.  He said that he just wants to sleep.  I followed up with the patient's nurse about the agreement.

## 2011-10-14 NOTE — Progress Notes (Signed)
Advanced Heart Failure Rounding Note  Subjective:    [name] is a 46 y/o male with HTN and DM2 with longstanding HF due to NICM (probably ETOH), EF 20%. Cath in 2008 showed left circumflex with 40% stenosis.  He is followed by [facility].    He presented to the ER with SOB, chest tightness.  He says his best friend was shot and killed a couple of weeks ago.  He has been depressed since that time and may have missed some meds.  Weight up 10 pounds from last clinic visit. (Baseline weight has been about 218)  ProBNP 5931.   Placed on milrinone.   Milrinone increased 0.375 earlier in the week but pt became hypotensive therefore cut back to 0.25. Milrinone stopped  10/09/11.     Evaluated by Neuro due to double vision, ptosis, and generalized weakness. CT of head completed with no definite evidence of brain stem infarct.  Thought to have myasthenia gravis based on good response to pyridostigmine.  He had a tunneled catheter placed and is now getting plasmaphereses and pyridostigmine.  He is feeling stronger and no longer has double vision. Wants to walk but nurses worried about falls.   Dig level 1.4.  Denies SOB/PND/orthopnea.  Diuresed well on po torsemide, weight down again (total .  K 5, stable.    Objective:   Weight Range:  Vital Signs:   Temp:  [96.8 F (36 C)-98.6 F (37 C)] 97.2 F (36.2 C) (09/26 0611) Pulse Rate:  [67-88] 67  (09/26 0611) Resp:  [12-23] 18  (09/26 0611) BP: (105-131)/(62-79) 109/68 mmHg (09/26 0611) SpO2:  [92 %-100 %] 97 % (09/26 0611) Weight:  [90.4 kg (199 lb 4.7 oz)-93.1 kg (205 lb 4 oz)] 90.4 kg (199 lb 4.7 oz) (09/26 0611) Last BM Date: 10/13/11  Weight change: Filed Weights   10/13/11 0615 10/13/11 1047 10/14/11 0611  Weight: 95.255 kg (210 lb) 93.1 kg (205 lb 4 oz) 90.4 kg (199 lb 4.7 oz)    Intake/Output:   Intake/Output Summary (Last 24 hours) at 10/14/11 0830 Last data filed at 10/13/11 2200  Gross per 24 hour  Intake   1146 ml    Output   3175 ml  Net  -2029 ml     Physical Exam: General:  No acute distress. No resp difficulty on 2L HEENT: normal Neck: supple. JVP flat. Carotids 2+ bilat; no bruits. No lymphadenopathy or thryomegaly appreciated. Cor: PMI nondisplaced. Regular rate & rhythm. 2/6 MR, +S3 Lungs: clear Abdomen: soft, nontender, no distention. No hepatosplenomegaly. No bruits or masses. Good bowel sounds. Extremities: no cyanosis, clubbing, rash, edema Neuro: altered & orientedx3, cranial nerves grossly intact. No nystagmus. No dysconjugate gaze. moves all 4 extremities w/o difficulty.   Telemetry: Sinus rhythm   Labs: Basic Metabolic Panel:  Lab 10/14/11 1610 10/13/11 0610 10/12/11 9604 10/12/11 0535 10/11/11 1025 10/11/11 0648  NA 138 134* 134* 134* -- 131*  K 5.0 4.9 5.1 5.9* 5.0 --  CL 98 95* 97 98 -- 94*  CO2 30 27 30 25  -- 32  GLUCOSE 78 90 103* 96 -- 122*  BUN 27* 24* 21 21 -- 20  CREATININE 1.89* 1.71* 1.76* 1.65* -- 1.84*  CALCIUM 9.4 9.7 9.5 -- -- --  MG -- -- -- -- -- --  PHOS -- -- -- -- -- --    Liver Function Tests: No results found for this basename: AST:5,ALT:5,ALKPHOS:5,BILITOT:5,PROT:5,ALBUMIN:5 in the last 168 hours No results found for this basename: LIPASE:5,AMYLASE:5  in the last 168 hours No results found for this basename: AMMONIA:3 in the last 168 hours  CBC:  Lab 10/11/11 1516  WBC 4.9  NEUTROABS --  HGB 16.2  HCT 46.7  MCV 84.0  PLT 252    Cardiac Enzymes: No results found for this basename: CKTOTAL:5,CKMB:5,CKMBINDEX:5,TROPONINI:5 in the last 168 hours  BNP: BNP (last 3 results)  Basename 10/05/11 1947 09/03/11 1502 06/13/11 1115  PROBNP 5931.0* 3290.0* 3799.0*    Imaging:    Medications:     Scheduled Medications:    . therapeutic plasma exchange solution   Dialysis Once in dialysis  . therapeutic plasma exchange solution   Dialysis Once in dialysis  . therapeutic plasma exchange solution   Dialysis Once in dialysis  . therapeutic  plasma exchange solution   Dialysis Once in dialysis  . atorvastatin  20 mg Oral q1800  . carvedilol  6.25 mg Oral BID WC  . clorazepate  15 mg Oral QHS  . clorazepate  7.5 mg Oral Daily  . digoxin  0.25 mg Oral Daily  . DULoxetine  60 mg Oral Daily  . gabapentin  600 mg Oral TID  . glipiZIDE  10 mg Oral QAC breakfast  . heparin  1,000 Units Intracatheter Once  . heparin  1,000 Units Intracatheter Once  . hydrALAZINE  25 mg Oral Q8H  . insulin aspart  0-15 Units Subcutaneous TID WC  . insulin glargine  15 Units Subcutaneous QHS  . isosorbide mononitrate  30 mg Oral Daily  . pantoprazole  40 mg Oral Q1200  . pyridostigmine  60 mg Oral Q6H  . senna  1 tablet Oral Daily  . sodium chloride  3 mL Intravenous Q12H  . torsemide  60 mg Oral Daily    Infusions:    . sodium chloride 12.2 mL/hr (10/09/11 0117)  . citrate dextrose    . citrate dextrose 500 mL (10/13/11 1251)    PRN Medications: sodium chloride, acetaminophen, acetaminophen, calcium carbonate, calcium carbonate, calcium gluconate IVPB, calcium gluconate IVPB, calcium gluconate IVPB, calcium gluconate IVPB, calcium gluconate, calcium gluconate, diazepam, diphenhydrAMINE, diphenhydrAMINE, guaiFENesin-dextromethorphan, ondansetron (ZOFRAN) IV, oxyCODONE-acetaminophen, sodium chloride   Assessment:   1) A/C Systolic HF      -on Hospice  2) NICM, ETOH       -EF 15% (3/13)  3) OSA on CPAP  4) DM2  5) H/o noncompliance  6) A/C renal failure due to cardiorenal syndrome  7) Depression  8) Acute respiratory failure - resolved with diuresis 9) Myasthenia gravis  Plan/Discussion:    Volume status improved on po meds, Cr increasing so will hold demadex today.  Follow up tomorrow.  Continue hydralazine/imdur.  Will continue beta blocker but with mestinon will monitor for bradycardia.  No ACE-I due to hyperkalemia.  Dig level 1.4, will stop for now.  Can restart at 0.125 on Saturday.  Plasmapheresis for myasthenia gravis per  neuro.  Now 2/5 doses complete. Hopefully this can continue over the weekend so we can get him home soon.  [name] 8:32 AM

## 2011-10-14 NOTE — Progress Notes (Addendum)
[facility] PROGRESS NOTE    SUBJECTIVE   No complaints.  No diplopia, diarrhea, excessive salivation.    OBJECTIVE   Vital signs in last 24 hours: Temp:  [96.8 F (36 C)-98.6 F (37 C)] 97.2 F (36.2 C) (09/26 0611) Pulse Rate:  [67-88] 67  (09/26 0611) Resp:  [12-23] 18  (09/26 0611) BP: (105-131)/(62-79) 109/68 mmHg (09/26 0611) SpO2:  [92 %-100 %] 97 % (09/26 0611) Weight:  [90.4 kg (199 lb 4.7 oz)-93.1 kg (205 lb 4 oz)] 90.4 kg (199 lb 4.7 oz) (09/26 1610)  Intake/Output from previous day: 09/25 0701 - 09/26 0700 In: 1146 [P.O.:1146] Out: 3175 [Urine:3175] Intake/Output this shift: Total I/O In: 240 [P.O.:240] Out: -  Nutritional status: Cardiac  Past Medical History  Diagnosis Date  . Hypertension   . 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 History of peptic ulcer disease     EGD 09/2007 shows prox gastric ulcer with black eschar, treated with PPI  . Depression   . History of alcohol abuse     quit approx 2011-2012  . Angina   . Shortness of breath   . OSA (obstructive sleep apnea)     sleeps with oxygen  . Cardiomyopathy   . ICD (implantable cardiac defibrillator) in place   . Diabetes mellitus     insulin dependent  . GERD (gastroesophageal reflux disease)   . Headache       Neurologic Exam:  Mental Status:  Alert, oriented X 3. Speech fluent without evidence of aphasia. Able to follow 3 step commands without difficulty.  Thought content appropriate  Cranial Nerves:  II-Visual fields grossly intact.  III/IV/VI-Extraocular movements intact. Pupils reactive bilaterally. Ptosis not present. Able to hold eyelids open without difficulty.  V/VII-Smile symmetric  VIII-grossly intact  IX/X-normal gag  XI-bilateral shoulder shrug  XII-midline tongue extension  Motor: 5/5 bilaterally with normal tone and  bulk  Sensory: Pinprick and light touch intact throughout, bilaterally  Deep Tendon Reflexes:  2+ throughout  Plantars: Right: downgoing Left: Downgoing  Cerebellar: Normal finger-to-nose, normal heel-to-shin test.    Lab Results: Lab Results  Component Value Date/Time   CHOL 166 09/15/2011  4:33 PM   Lipid Panel No results found for this basename: CHOL,TRIG,HDL,CHOLHDL,VLDL,LDLCALC in the last 72 hours  Studies/Results: Ct Chest Wo Contrast  10/12/2011  *RADIOLOGY REPORT*  Clinical Data: New onset myasthenia gravis, evaluate for thymoma  CT CHEST WITHOUT CONTRAST  Technique:  Multidetector CT imaging of the chest was performed following the standard protocol without IV contrast.  Comparison: Chest radiograph dated 10/05/2011.  CT chest dated 05/13/2010.  Findings: Very mild patchy/tree-in-bud opacity in the left lower lobe (series 3/image 34), likely infectious/inflammatory.  Mild nodularity in the superior segment right lower lobe measuring up to 4 mm (series 3/image 27).  No pleural effusion or pneumothorax.  Visualized thyroid is mildly heterogeneous.  No evidence of anterior mediastinal mass.  Borderline enlarged prevascular/mediastinal lymph nodes, grossly unchanged, including: --1.1 cm short-axis left paratracheal node (series 2/image 15) --1.0 cm short-axis prevascular node (series 2/image 24) --1.0 cm short-axis precarinal node (series 2/image 25) --1.3 cm short-axis subcarinal node (series 2/image 31)  The heart  is top normal in size.  No pericardial effusion.  Left subclavian ICD.  Right IJ venous catheter.  Visualized upper abdomen is unremarkable.  Visualized osseous structures are within normal limits.  IMPRESSION: No evidence of anterior mediastinal mass or thymoma.  Borderline enlarged prevascular/mediastinal lymph nodes, unchanged from 2012, likely reactive.  4 mm right lower lobe nodule, possibly infectious/inflammatory.  If this patient is high risk for primary bronchogenic  neoplasm, consider a single follow-up CT in 12 months.  This recommendation follows the consensus statement: Guidelines for Management of Small Pulmonary Nodules Detected on CT Scans: A Statement from the Fleischner Society as published in Radiology 2005; 237:395-400.   Original Report Authenticated By: Charline Bills, M.D.     Medications:     Scheduled:   . therapeutic plasma exchange solution   Dialysis Once in dialysis  . therapeutic plasma exchange solution   Dialysis Once in dialysis  . therapeutic plasma exchange solution   Dialysis Once in dialysis  . therapeutic plasma exchange solution   Dialysis Once in dialysis  . atorvastatin  20 mg Oral q1800  . carvedilol  6.25 mg Oral BID WC  . clorazepate  15 mg Oral QHS  . clorazepate  7.5 mg Oral Daily  . digoxin  0.125 mg Oral Daily  . DULoxetine  60 mg Oral Daily  . gabapentin  600 mg Oral TID  . glipiZIDE  10 mg Oral QAC breakfast  . heparin  1,000 Units Intracatheter Once  . heparin  1,000 Units Intracatheter Once  . hydrALAZINE  25 mg Oral Q8H  . insulin aspart  0-15 Units Subcutaneous TID WC  . insulin glargine  15 Units Subcutaneous QHS  . isosorbide mononitrate  30 mg Oral Daily  . pantoprazole  40 mg Oral Q1200  . pyridostigmine  60 mg Oral Q6H  . senna  1 tablet Oral Daily  . sodium chloride  3 mL Intravenous Q12H  . DISCONTD: digoxin  0.25 mg Oral Daily  . DISCONTD: torsemide  60 mg Oral Daily    Assessment/Plan:   Patient Active Hospital Problem List:  Myasthenia gravis with improvement after starting Mestinon. Is currently undergoing treatment with plasmapheresis and is completed the first of 5 plasma exchanges next planned for Friday (will be PLX #3).   Plan:  Plasmapheresis we'll continue as planned.  Will continue Mestinon schedule to 60 mg every 6 hours.  We will continue to follow him closely with you.   Spoke with hemodialysis and they have stated they will be able to continue over weekend.    Felicie Morn PA-C [facility] [phone]  10/14/2011, 9:49 AM

## 2011-10-14 NOTE — Progress Notes (Signed)
OT Screen Note  Orders received for OT consult.  Note pt independent with PT on 10/13/11.  Went in and discussed role of OT with pt.  He reports not having any issues with selfcare, balance, or mobility.  No OT needs at this time. Perrin Maltese, OTR/L Pager number [phone] 10/14/2011

## 2011-10-15 ENCOUNTER — Encounter (HOSPITAL_COMMUNITY): Payer: Self-pay

## 2011-10-15 ENCOUNTER — Inpatient Hospital Stay (HOSPITAL_COMMUNITY)

## 2011-10-15 LAB — GLUCOSE, CAPILLARY
Glucose-Capillary: 91 mg/dL (ref 70–99)
Glucose-Capillary: 112 mg/dL — ABNORMAL HIGH (ref 70–99)
Glucose-Capillary: 302 mg/dL — ABNORMAL HIGH (ref 70–99)

## 2011-10-15 LAB — BASIC METABOLIC PANEL
CO2: 31 mEq/L (ref 19–32)
Calcium: 9.6 mg/dL (ref 8.4–10.5)
Chloride: 97 mEq/L (ref 96–112)
Creatinine, Ser: 1.72 mg/dL — ABNORMAL HIGH (ref 0.50–1.35)
Glucose, Bld: 93 mg/dL (ref 70–99)

## 2011-10-15 LAB — CBC
Hemoglobin: 16.7 g/dL (ref 13.0–17.0)
MCH: 28.6 pg (ref 26.0–34.0)
MCV: 84.4 fL (ref 78.0–100.0)
RBC: 5.84 MIL/uL — ABNORMAL HIGH (ref 4.22–5.81)
WBC: 4.8 10*3/uL (ref 4.0–10.5)

## 2011-10-15 LAB — COMPREHENSIVE METABOLIC PANEL
AST: 21 U/L (ref 0–37)
Albumin: 3.8 g/dL (ref 3.5–5.2)
Calcium: 9 mg/dL (ref 8.4–10.5)
Creatinine, Ser: 1.4 mg/dL — ABNORMAL HIGH (ref 0.50–1.35)
GFR calc non Af Amer: 60 mL/min — ABNORMAL LOW (ref >90)

## 2011-10-15 MED ORDER — ACD FORMULA A 0.73-2.45-2.2 GM/100ML VI SOLN
Status: AC
  Administered 2011-10-15: 10:00:00
  Filled 2011-10-15: qty 500

## 2011-10-15 MED ORDER — TORSEMIDE 20 MG PO TABS
40.0000 mg | ORAL_TABLET | Freq: Every day | ORAL | Status: DC
  Administered 2011-10-15 – 2011-10-18 (×4): 40 mg via ORAL
  Filled 2011-10-15 (×5): qty 2

## 2011-10-15 MED ORDER — ISOSORBIDE MONONITRATE ER 60 MG PO TB24
60.0000 mg | ORAL_TABLET | Freq: Every day | ORAL | Status: DC
  Administered 2011-10-15 – 2011-10-19 (×5): 60 mg via ORAL
  Filled 2011-10-15 (×5): qty 1

## 2011-10-15 MED ORDER — CALCIUM CARBONATE ANTACID 500 MG PO CHEW
2.0000 | CHEWABLE_TABLET | ORAL | Status: AC
  Administered 2011-10-15 (×2): 400 mg via ORAL
  Filled 2011-10-15 (×2): qty 2

## 2011-10-15 MED ORDER — ACD FORMULA A 0.73-2.45-2.2 GM/100ML VI SOLN
Status: AC
  Administered 2011-10-15: 12:00:00
  Filled 2011-10-15: qty 500

## 2011-10-15 MED ORDER — HYDRALAZINE HCL 25 MG PO TABS
37.5000 mg | ORAL_TABLET | Freq: Three times a day (TID) | ORAL | Status: DC
  Administered 2011-10-15 – 2011-10-19 (×11): 37.5 mg via ORAL
  Filled 2011-10-15 (×16): qty 1.5

## 2011-10-15 MED ORDER — SODIUM CHLORIDE 0.9 % IV SOLN
INTRAVENOUS | Status: AC
  Administered 2011-10-15 (×3): via INTRAVENOUS_CENTRAL
  Filled 2011-10-15 (×3): qty 200

## 2011-10-15 NOTE — Progress Notes (Signed)
RM [id number]   [name]                                  [facility] SW Note:  Pt was alert, engaging and denied pain. Actively listened as pt shared his frustrations about being ill and hospitalized. He is concerned about his children and spouse, particularly since his spouse has limited transportation to the hospital to visit. Pt denied any immediate needs and appreciated the time to vent his feelings. Reassured him of continued hospice support. Sw will continue to follow to provide support.Althia Forts, LCSW

## 2011-10-15 NOTE — Progress Notes (Signed)
Pt has order for Albumin and Therapeutic plasma exchange notified Kristen in HD instructed that assigned nurse will be notified.  Amanda Pea, Charity fundraiser.

## 2011-10-15 NOTE — Progress Notes (Addendum)
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  Pt is DNR code.    Pt alert & oriented, sitting up in bed, with no complaints of pain or discomfort.    No family present.    Pt states Day 3 of 5 for plasmophoresis scheduled for today.  Pt admitted to sleeping well last pm.  Asked for shower chair at home for bathing due to increased weakness in BLE. (This will be ordered through [facility] DME specialist).  Pt states he may be discharged Sun or beginning of next week.  Pt is concerned about transportation home - no viable method of transportation- Discussed with Hospital CM Terri - pt non related to [facility] diagnosis and hospice is not responsible for transport - Terri will advise SW for possible taxi cab voucher for transport home.   Please call [facility] @ [phone] with any hospice needs.  Thank you.  Joneen Boers, RN  [facility]  Hospice Liaison

## 2011-10-15 NOTE — Progress Notes (Signed)
Advanced Heart Failure Rounding Note  Subjective:    [name] is a 46 y/o male with HTN and DM2 with longstanding HF due to NICM (probably ETOH), EF 20%. Cath in 2008 showed left circumflex with 40% stenosis.  He is followed by [facility].    He presented to the ER with SOB, chest tightness.  He says his best friend was shot and killed a couple of weeks ago.  He has been depressed since that time and may have missed some meds.  Weight up 10 pounds from last clinic visit. (Baseline weight has been about 218)  ProBNP 5931.   Placed on milrinone.   Milrinone increased 0.375 earlier in the week but pt became hypotensive therefore cut back to 0.25. Milrinone stopped  10/09/11.     Evaluated by Neuro due to double vision, ptosis, and generalized weakness. CT of head completed with no definite evidence of brain stem infarct.  Thought to have myasthenia gravis based on good response to pyridostigmine.  He had a tunneled catheter placed and is now getting plasmaphereses and pyridostigmine.  He is feeling stronger and no longer has double vision.   Dig level 1.4.  Denies SOB/PND/orthopnea.  Torsemide held yesterday due to rise in Cr. To 1.89, improving 1.72 today.     Objective:   Weight Range:  Vital Signs:   Temp:  [97.4 F (36.3 C)-97.7 F (36.5 C)] 97.4 F (36.3 C) (09/27 0515) Pulse Rate:  [70-79] 70  (09/27 0515) Resp:  [16-20] 16  (09/27 0515) BP: (110-113)/(55-74) 113/74 mmHg (09/27 0515) SpO2:  [99 %-100 %] 100 % (09/27 0515) Weight:  [209 lb 3.2 oz (94.892 kg)] 209 lb 3.2 oz (94.892 kg) (09/27 0515) Last BM Date: 10/13/11  Weight change: Filed Weights   10/13/11 1047 10/14/11 0611 10/15/11 0515  Weight: 205 lb 4 oz (93.1 kg) 199 lb 4.7 oz (90.4 kg) 209 lb 3.2 oz (94.892 kg)    Intake/Output:   Intake/Output Summary (Last 24 hours) at 10/15/11 0753 Last data filed at 10/15/11 0521  Gross per 24 hour  Intake    820 ml  Output   2625 ml  Net  -1805 ml      Physical Exam: General:  No acute distress. No resp difficulty on 2L HEENT: normal Neck: supple. JVP 5-6. Carotids 2+ bilat; no bruits. No lymphadenopathy or thryomegaly appreciated. Cor: PMI nondisplaced. Regular rate & rhythm. 2/6 MR, +S3.  Port rt subclavian Lungs: clear Abdomen: soft, nontender, no distention. No hepatosplenomegaly. No bruits or masses. Good bowel sounds. Extremities: no cyanosis, clubbing, rash, edema Neuro: altered & orientedx3, cranial nerves grossly intact. No nystagmus. No dysconjugate gaze. moves all 4 extremities w/o difficulty.   Telemetry: Sinus rhythm   Labs: Basic Metabolic Panel:  Lab 10/15/11 1610 10/14/11 0522 10/13/11 0610 10/12/11 0832 10/12/11 0535  NA 135 138 134* 134* 134*  K 4.8 5.0 4.9 5.1 5.9*  CL 97 98 95* 97 98  CO2 31 30 27 30 25   GLUCOSE 93 78 90 103* 96  BUN 25* 27* 24* 21 21  CREATININE 1.72* 1.89* 1.71* 1.76* 1.65*  CALCIUM 9.6 9.4 9.7 -- --  MG -- -- -- -- --  PHOS -- -- -- -- --    Liver Function Tests: No results found for this basename: AST:5,ALT:5,ALKPHOS:5,BILITOT:5,PROT:5,ALBUMIN:5 in the last 168 hours No results found for this basename: LIPASE:5,AMYLASE:5 in the last 168 hours No results found for this basename: AMMONIA:3 in the last 168 hours  CBC:  Lab 10/11/11 1516  WBC 4.9  NEUTROABS --  HGB 16.2  HCT 46.7  MCV 84.0  PLT 252    Cardiac Enzymes: No results found for this basename: CKTOTAL:5,CKMB:5,CKMBINDEX:5,TROPONINI:5 in the last 168 hours  BNP: BNP (last 3 results)  Basename 10/05/11 1947 09/03/11 1502 06/13/11 1115  PROBNP 5931.0* 3290.0* 3799.0*    Imaging:    Medications:     Scheduled Medications:    . therapeutic plasma exchange solution   Dialysis Once in dialysis  . therapeutic plasma exchange solution   Dialysis Q1 Hr x 3  . atorvastatin  20 mg Oral q1800  . calcium carbonate  2 tablet Oral Q3H  . carvedilol  6.25 mg Oral BID WC  . citrate dextrose      . clorazepate   15 mg Oral QHS  . clorazepate  7.5 mg Oral Daily  . digoxin  0.125 mg Oral Daily  . DULoxetine  60 mg Oral Daily  . gabapentin  600 mg Oral TID  . glipiZIDE  10 mg Oral QAC breakfast  . heparin  1,000 Units Intracatheter Once  . hydrALAZINE  37.5 mg Oral Q8H  . insulin aspart  0-15 Units Subcutaneous TID WC  . insulin glargine  15 Units Subcutaneous QHS  . isosorbide mononitrate  60 mg Oral Daily  . pantoprazole  40 mg Oral Q1200  . pyridostigmine  60 mg Oral Q6H  . senna  1 tablet Oral Daily  . sodium chloride  3 mL Intravenous Q12H  . torsemide  40 mg Oral Daily  . DISCONTD: digoxin  0.25 mg Oral Daily  . DISCONTD: hydrALAZINE  25 mg Oral Q8H  . DISCONTD: isosorbide mononitrate  30 mg Oral Daily  . DISCONTD: torsemide  60 mg Oral Daily    Infusions:    . sodium chloride 12.2 mL/hr (10/09/11 0117)  . citrate dextrose    . citrate dextrose 500 mL (10/13/11 1251)    PRN Medications: sodium chloride, acetaminophen, acetaminophen, calcium carbonate, calcium carbonate, calcium gluconate IVPB, calcium gluconate IVPB, calcium gluconate IVPB, calcium gluconate IVPB, calcium gluconate, calcium gluconate, diazepam, diphenhydrAMINE, diphenhydrAMINE, guaiFENesin-dextromethorphan, ondansetron (ZOFRAN) IV, oxyCODONE-acetaminophen, sodium chloride   Assessment:   1) A/C Systolic HF      -on Hospice  2) NICM, ETOH       -EF 15% (3/13)  3) OSA on CPAP  4) DM2  5) H/o noncompliance  6) A/C renal failure due to cardiorenal syndrome  7) Depression  8) Acute respiratory failure - resolved with diuresis 9) Myasthenia gravis  Plan/Discussion:    Torsemide held yesterday due to rise in creatinine, Cr returned to 1.72.  Will restart at 40 mg daily and watch renal function closely.  Titrate hydralazine 37.5 mg TID and imdur 60 mg daily.  Monitor for bradycardia with mestinon.  No ACE-I due to hyperkalemia.  Will restart digoxin tomorrow.    Plasmapheresis for myasthenia gravis per neuro.   Will have 3rd dose today.  Will continue over the weekend and plan for discharge home early next week.  Interesting that AChR antibody and striated muscle antibodies are not elevated.  Will discuss with neurology.   [name] 10/15/2011 7:54 AM

## 2011-10-15 NOTE — Progress Notes (Signed)
Report given  to Dois Davenport, informing her pt has albumin and plasma exchange for today.  Brief report and travelling info. Given.  Pt will be picked up this am.  Amanda Pea, RN>

## 2011-10-16 LAB — BASIC METABOLIC PANEL
Chloride: 100 mEq/L (ref 96–112)
Creatinine, Ser: 1.77 mg/dL — ABNORMAL HIGH (ref 0.50–1.35)
GFR calc Af Amer: 52 mL/min — ABNORMAL LOW (ref >90)
GFR calc non Af Amer: 45 mL/min — ABNORMAL LOW (ref >90)

## 2011-10-16 LAB — GLUCOSE, CAPILLARY
Glucose-Capillary: 126 mg/dL — ABNORMAL HIGH (ref 70–99)
Glucose-Capillary: 150 mg/dL — ABNORMAL HIGH (ref 70–99)

## 2011-10-16 NOTE — Progress Notes (Signed)
Subjective: Patient with new diagnosis of MG.  S/P PLX  X3.  Patient is seen at the bedside, he is eating lunch without any significant dysphagia or sob. He is awake, alert and follows commands. He mentions that his motor symptoms have improved significantly.   He is ambulating in the hall without any difficulty No complications overnight  Objective: Current vital signs: BP 113/68  Pulse 80  Temp 98.2 F (36.8 C) (Oral)  Resp 18  Ht 6' (1.829 m)  Wt 95.5 kg (210 lb 8.6 oz)  BMI 28.55 kg/m2  SpO2 100% Vital signs in last 24 hours: Temp:  [97.3 F (36.3 C)-98.2 F (36.8 C)] 98.2 F (36.8 C) (09/28 0604) Pulse Rate:  [69-80] 80  (09/28 1055) Resp:  [18-19] 18  (09/28 0604) BP: (106-118)/(60-76) 113/68 mmHg (09/28 0853) SpO2:  [98 %-100 %] 100 % (09/28 0604) Weight:  [95.5 kg (210 lb 8.6 oz)] 95.5 kg (210 lb 8.6 oz) (09/28 0604)  Intake/Output from previous day: 09/27 0701 - 09/28 0700 In: 1280 [P.O.:1280] Out: 3100 [Urine:3100] Intake/Output this shift: Total I/O In: 360 [P.O.:360] Out: 650 [Urine:650] Nutritional status: Cardiac  Neurologic Exam: AAO No significant cranial nerve deficits Motor symptoms 5/5 in  B/L LE 5- in the Left UE and 5- in Right UE Reflexes intact No sensory deficits  Negative for romberg   Lab Results: Results for orders placed during the hospital encounter of 10/05/11 (from the past 48 hour(s))  GLUCOSE, CAPILLARY     Status: Abnormal   Collection Time   10/14/11  4:18 PM      Component Value Range Comment   Glucose-Capillary 172 (*) 70 - 99 mg/dL    Comment 1 Notify RN      Comment 2 Documented in Chart     GLUCOSE, CAPILLARY     Status: Abnormal   Collection Time   10/14/11  8:46 PM      Component Value Range Comment   Glucose-Capillary 158 (*) 70 - 99 mg/dL   BASIC METABOLIC PANEL     Status: Abnormal   Collection Time   10/15/11  5:51 AM      Component Value Range Comment   Sodium 135  135 - 145 mEq/L    Potassium 4.8  3.5 - 5.1  mEq/L    Chloride 97  96 - 112 mEq/L    CO2 31  19 - 32 mEq/L    Glucose, Bld 93  70 - 99 mg/dL    BUN 25 (*) 6 - 23 mg/dL    Creatinine, Ser 1.61 (*) 0.50 - 1.35 mg/dL    Calcium 9.6  8.4 - 09.6 mg/dL    GFR calc non Af Amer 47 (*) >90 mL/min    GFR calc Af Amer 54 (*) >90 mL/min   GLUCOSE, CAPILLARY     Status: Normal   Collection Time   10/15/11  5:59 AM      Component Value Range Comment   Glucose-Capillary 91  70 - 99 mg/dL    Comment 1 Notify RN      Comment 2 Documented in Chart     COMPREHENSIVE METABOLIC PANEL     Status: Abnormal   Collection Time   10/15/11 10:00 AM      Component Value Range Comment   Sodium 135  135 - 145 mEq/L    Potassium 4.4  3.5 - 5.1 mEq/L    Chloride 98  96 - 112 mEq/L    CO2 26  19 - 32 mEq/L    Glucose, Bld 155 (*) 70 - 99 mg/dL    BUN 23  6 - 23 mg/dL    Creatinine, Ser 1.61 (*) 0.50 - 1.35 mg/dL    Calcium 9.0  8.4 - 09.6 mg/dL    Total Protein 6.2  6.0 - 8.3 g/dL    Albumin 3.8  3.5 - 5.2 g/dL    AST 21  0 - 37 U/L    ALT 10  0 - 53 U/L    Alkaline Phosphatase 51  39 - 117 U/L    Total Bilirubin 0.6  0.3 - 1.2 mg/dL    GFR calc non Af Amer 60 (*) >90 mL/min    GFR calc Af Amer 69 (*) >90 mL/min   CBC     Status: Abnormal   Collection Time   10/15/11 10:00 AM      Component Value Range Comment   WBC 4.8  4.0 - 10.5 K/uL    RBC 5.84 (*) 4.22 - 5.81 MIL/uL    Hemoglobin 16.7  13.0 - 17.0 g/dL    HCT 04.5  40.9 - 81.1 %    MCV 84.4  78.0 - 100.0 fL    MCH 28.6  26.0 - 34.0 pg    MCHC 33.9  30.0 - 36.0 g/dL    RDW 91.4  78.2 - 95.6 %    Platelets 186  150 - 400 K/uL   GLUCOSE, CAPILLARY     Status: Abnormal   Collection Time   10/15/11  1:34 PM      Component Value Range Comment   Glucose-Capillary 112 (*) 70 - 99 mg/dL    Comment 1 Notify RN     GLUCOSE, CAPILLARY     Status: Abnormal   Collection Time   10/15/11  4:44 PM      Component Value Range Comment   Glucose-Capillary 302 (*) 70 - 99 mg/dL    Comment 1 Notify RN       GLUCOSE, CAPILLARY     Status: Normal   Collection Time   10/15/11  8:56 PM      Component Value Range Comment   Glucose-Capillary 90  70 - 99 mg/dL   BASIC METABOLIC PANEL     Status: Abnormal   Collection Time   10/16/11  5:00 AM      Component Value Range Comment   Sodium 135  135 - 145 mEq/L    Potassium 5.3 (*) 3.5 - 5.1 mEq/L    Chloride 100  96 - 112 mEq/L    CO2 29  19 - 32 mEq/L    Glucose, Bld 113 (*) 70 - 99 mg/dL    BUN 23  6 - 23 mg/dL    Creatinine, Ser 2.13 (*) 0.50 - 1.35 mg/dL    Calcium 9.4  8.4 - 08.6 mg/dL    GFR calc non Af Amer 45 (*) >90 mL/min    GFR calc Af Amer 52 (*) >90 mL/min   GLUCOSE, CAPILLARY     Status: Abnormal   Collection Time   10/16/11  5:56 AM      Component Value Range Comment   Glucose-Capillary 126 (*) 70 - 99 mg/dL    Comment 1 Notify RN     GLUCOSE, CAPILLARY     Status: Abnormal   Collection Time   10/16/11 11:04 AM      Component Value Range Comment   Glucose-Capillary 144 (*) 70 - 99 mg/dL  Comment 1 Notify RN       No results found for this or any previous visit (from the past 240 hour(s)).  Lipid Panel No results found for this basename: CHOL,TRIG,HDL,CHOLHDL,VLDL,LDLCALC in the last 72 hours  Studies/Results: No results found.  Medications:  Scheduled:   . therapeutic plasma exchange solution   Dialysis Once in dialysis  . atorvastatin  20 mg Oral q1800  . calcium carbonate  2 tablet Oral Q3H  . carvedilol  6.25 mg Oral BID WC  . clorazepate  15 mg Oral QHS  . clorazepate  7.5 mg Oral Daily  . digoxin  0.125 mg Oral Daily  . DULoxetine  60 mg Oral Daily  . gabapentin  600 mg Oral TID  . glipiZIDE  10 mg Oral QAC breakfast  . heparin  1,000 Units Intracatheter Once  . hydrALAZINE  37.5 mg Oral Q8H  . insulin aspart  0-15 Units Subcutaneous TID WC  . insulin glargine  15 Units Subcutaneous QHS  . isosorbide mononitrate  60 mg Oral Daily  . pantoprazole  40 mg Oral Q1200  . pyridostigmine  60 mg Oral Q6H  .  senna  1 tablet Oral Daily  . sodium chloride  3 mL Intravenous Q12H  . torsemide  40 mg Oral Daily    Assessment/Plan:  Patient with MG.  S/P PLX.  Continue with PLX total of 5 PT/OT  Kishawn Pickar V-P Eilleen Kempf., MD., Ph.D.,MS 10/16/2011 1:19 PM

## 2011-10-16 NOTE — Progress Notes (Signed)
Patient ID: [name], male   DOB: 01/07/1966, 46 y.o.   MRN: [id number]  Subjective:    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 Cath in 2008 showed left circumflex with 40% stenosis.  He is followed by [facility].    He presented to the ER with SOB, chest tightness.  He says his best friend was shot and killed a couple of weeks ago.  He has been depressed since that time and may have missed some meds.  Weight up 10 pounds from last clinic visit. (Baseline weight has been about 218)  ProBNP 5931.   Placed on milrinone.   Milrinone increased 0.375 earlier in the week but pt became hypotensive therefore cut back to 0.25. Milrinone stopped  10/09/11.     Evaluated by Neuro due to double vision, ptosis, and generalized weakness. CT of head completed with no definite evidence of brain stem infarct.  Thought to have myasthenia gravis based on good response to pyridostigmine.  He had a tunneled catheter placed and is now getting plasmaphereses and pyridostigmine.  He is feeling stronger and no longer has double vision.   Dig level 1.4.  Denies SOB/PND/orthopnea.  Torsemide held yesterday due to rise in Cr. To 1.89, improving 1.77 today.     Objective:   Weight Range:  Vital Signs:   Temp:  [96.6 F (35.9 C)-98.7 F (37.1 C)] 98.2 F (36.8 C) (09/28 0604) Pulse Rate:  [64-82] 80  (09/28 0604) Resp:  [13-19] 18  (09/28 0604) BP: (99-130)/(60-78) 118/76 mmHg (09/28 0604) SpO2:  [95 %-100 %] 100 % (09/28 0604) Weight:  [200 lb 9.9 oz (91 kg)-210 lb 8.6 oz (95.5 kg)] 210 lb 8.6 oz (95.5 kg) (09/28 0604) Last BM Date: 10/13/11  Weight change: Filed Weights   10/15/11 0515 10/15/11 0937 10/16/11 0604  Weight: 209 lb 3.2 oz (94.892 kg) 200 lb 9.9 oz (91 kg) 210 lb 8.6 oz (95.5 kg)    Intake/Output:   Intake/Output Summary (Last 24 hours) at 10/16/11 0836 Last data filed at 10/16/11 0022  Gross per 24 hour  Intake    1280 ml  Output   3100 ml  Net  -1820 ml     Physical Exam: General:  No acute distress. No resp difficulty on 2L HEENT: normal Neck: supple. JVP 5-6. Carotids 2+ bilat; no bruits. No lymphadenopathy or thryomegaly appreciated. Cor: PMI nondisplaced. Regular rate & rhythm. 2/6 MR, +S3.  Port rt subclavian Lungs: clear Abdomen: soft, nontender, no distention. No hepatosplenomegaly. No bruits or masses. Good bowel sounds. Extremities: no cyanosis, clubbing, rash, edema Neuro: altered & orientedx3, cranial nerves grossly intact. No nystagmus. No dysconjugate gaze. moves all 4 extremities w/o difficulty.   Telemetry: Sinus rhythm   Labs: Basic Metabolic Panel:  Lab 10/16/11 9147 10/15/11 1000 10/15/11 0551 10/14/11 0522 10/13/11 0610  NA 135 135 135 138 134*  K 5.3* 4.4 4.8 5.0 4.9  CL 100 98 97 98 95*  CO2 29 26 31 30 27   GLUCOSE 113* 155* 93 78 90  BUN 23 23 25* 27* 24*  CREATININE 1.77* 1.40* 1.72* 1.89* 1.71*  CALCIUM 9.4 9.0 9.6 -- --  MG -- -- -- -- --  PHOS -- -- -- -- --    Liver Function Tests:  Lab 10/15/11 1000  AST 21  ALT 10  ALKPHOS 51  BILITOT 0.6  PROT 6.2  ALBUMIN 3.8  No results found for this basename: LIPASE:5,AMYLASE:5 in the last 168 hours No results found for this basename: AMMONIA:3 in the last 168 hours  CBC:  Lab 10/15/11 1000 10/11/11 1516  WBC 4.8 4.9  NEUTROABS -- --  HGB 16.7 16.2  HCT 49.3 46.7  MCV 84.4 84.0  PLT 186 252    Cardiac Enzymes: No results found for this basename: CKTOTAL:5,CKMB:5,CKMBINDEX:5,TROPONINI:5 in the last 168 hours  BNP: BNP (last 3 results)  Basename 10/05/11 1947 09/03/11 1502 06/13/11 1115  PROBNP 5931.0* 3290.0* 3799.0*    Imaging:    Medications:     Scheduled Medications:    . therapeutic plasma exchange solution   Dialysis Once in dialysis  . therapeutic plasma exchange solution   Dialysis Q1 Hr x 3  . atorvastatin  20 mg Oral q1800  . calcium carbonate  2 tablet Oral Q3H  .  carvedilol  6.25 mg Oral BID WC  . citrate dextrose      . citrate dextrose      . clorazepate  15 mg Oral QHS  . clorazepate  7.5 mg Oral Daily  . digoxin  0.125 mg Oral Daily  . DULoxetine  60 mg Oral Daily  . gabapentin  600 mg Oral TID  . glipiZIDE  10 mg Oral QAC breakfast  . heparin  1,000 Units Intracatheter Once  . hydrALAZINE  37.5 mg Oral Q8H  . insulin aspart  0-15 Units Subcutaneous TID WC  . insulin glargine  15 Units Subcutaneous QHS  . isosorbide mononitrate  60 mg Oral Daily  . pantoprazole  40 mg Oral Q1200  . pyridostigmine  60 mg Oral Q6H  . senna  1 tablet Oral Daily  . sodium chloride  3 mL Intravenous Q12H  . torsemide  40 mg Oral Daily    Infusions:    . sodium chloride 12.2 mL/hr (10/09/11 0117)  . citrate dextrose    . citrate dextrose 500 mL (10/13/11 1251)    PRN Medications: sodium chloride, acetaminophen, acetaminophen, calcium carbonate, calcium carbonate, calcium gluconate IVPB, calcium gluconate IVPB, calcium gluconate IVPB, calcium gluconate IVPB, calcium gluconate, calcium gluconate, diazepam, diphenhydrAMINE, diphenhydrAMINE, guaiFENesin-dextromethorphan, ondansetron (ZOFRAN) IV, oxyCODONE-acetaminophen, sodium chloride   Assessment:   1) A/C Systolic HF      -on Hospice  2) NICM, ETOH       -EF 15% (3/13)  3) OSA on CPAP  4) DM2  5) H/o noncompliance  6) A/C renal failure due to cardiorenal syndrome  7) Depression  8) Acute respiratory failure - resolved with diuresis 9) Myasthenia gravis  Plan/Discussion:    Toresimide 40 mg resumed  Cr returned to 1.77    Titrated hydralazine 37.5 mg TID and imdur 60 mg daily.  No bradycardia with mestinon.  No ACE-I due to hyperkalemia.  Restart digoxen per Dr Shirlee Latch    Plasmapheresis for myasthenia gravis per neuro.  Will have 3rd dose today.  Will continue over the weekend and plan for discharge home early next week.  Interesting that AChR antibody and striated muscle antibodies are not  elevated.  Will discuss with neurology.   Charlton Haws 10/16/2011 8:36 AM

## 2011-10-16 NOTE — Progress Notes (Signed)
Pt will place on mask when ready to go to bed. Pt encouraged to call RT if pt has any questions or needs help with placing mask on.

## 2011-10-16 NOTE — Progress Notes (Signed)
Pt's IV is out dated and pt requesting IV team to insert one. IV team notified.

## 2011-10-17 LAB — GLUCOSE, CAPILLARY
Glucose-Capillary: 126 mg/dL — ABNORMAL HIGH (ref 70–99)
Glucose-Capillary: 97 mg/dL (ref 70–99)
Glucose-Capillary: 187 mg/dL — ABNORMAL HIGH (ref 70–99)

## 2011-10-17 LAB — BASIC METABOLIC PANEL
CO2: 29 mEq/L (ref 19–32)
Chloride: 99 mEq/L (ref 96–112)
Glucose, Bld: 94 mg/dL (ref 70–99)
Potassium: 4.9 mEq/L (ref 3.5–5.1)
Sodium: 138 mEq/L (ref 135–145)

## 2011-10-17 MED ORDER — CALCIUM CARBONATE ANTACID 500 MG PO CHEW
CHEWABLE_TABLET | ORAL | Status: AC
  Administered 2011-10-17: 400 mg via ORAL
  Filled 2011-10-17: qty 2

## 2011-10-17 MED ORDER — SODIUM CHLORIDE 0.9 % IV SOLN
INTRAVENOUS | Status: AC
  Administered 2011-10-17 (×2): via INTRAVENOUS_CENTRAL
  Filled 2011-10-17 (×3): qty 200

## 2011-10-17 MED ORDER — ACD FORMULA A 0.73-2.45-2.2 GM/100ML VI SOLN
Status: AC
  Administered 2011-10-17: 500 mL via INTRAVENOUS
  Filled 2011-10-17: qty 500

## 2011-10-17 NOTE — Progress Notes (Signed)
Patient ID: [name], male   DOB: Jan 18, 1966, 46 y.o.   MRN: [id number]  Subjective:    [name] is a 46 y/o male with HTN and DM2 with longstanding HF due to NICM (probably ETOH), EF 20%. Cath in 2008 showed left circumflex with 40% stenosis.  He is followed by [facility].    He presented to the ER with SOB, chest tightness.  He says his best friend was shot and killed a couple of weeks ago.  He has been depressed since that time and may have missed some meds.  Weight up 10 pounds from last clinic visit. (Baseline weight has been about 218)  ProBNP 5931.   Placed on milrinone.   Milrinone increased 0.375 earlier in the week but pt became hypotensive therefore cut back to 0.25. Milrinone stopped  10/09/11.     Evaluated by Neuro due to double vision, ptosis, and generalized weakness. CT of head completed with no definite evidence of brain stem infarct.  Thought to have myasthenia gravis based on good response to pyridostigmine.  He had a tunneled catheter placed and is now getting plasmaphereses and pyridostigmine.  He is feeling stronger and no longer has double vision.   Dig level 1.4.  Denies SOB/PND/orthopnea.  Cr 1.86    Objective:   Weight Range:  Vital Signs:   Temp:  [97.8 F (36.6 C)-98.2 F (36.8 C)] 98 F (36.7 C) (09/29 0636) Pulse Rate:  [71-92] 71  (09/29 0636) Resp:  [20] 20  (09/29 0636) BP: (111-125)/(60-83) 125/83 mmHg (09/29 0636) SpO2:  [93 %-100 %] 100 % (09/29 0636) Weight:  [209 lb 7 oz (95 kg)] 209 lb 7 oz (95 kg) (09/29 0636) Last BM Date: 10/15/11  Weight change: Filed Weights   10/15/11 0937 10/16/11 0604 10/17/11 0636  Weight: 200 lb 9.9 oz (91 kg) 210 lb 8.6 oz (95.5 kg) 209 lb 7 oz (95 kg)    Intake/Output:   Intake/Output Summary (Last 24 hours) at 10/17/11 0756 Last data filed at 10/17/11 0307  Gross per 24 hour  Intake   1800 ml  Output   4550 ml  Net  -2750 ml     Physical Exam: General:  No acute distress. No resp  difficulty on 2L HEENT: normal Neck: supple. JVP 5-6. Carotids 2+ bilat; no bruits. No lymphadenopathy or thryomegaly appreciated. Cor: PMI nondisplaced. Regular rate & rhythm. 2/6 MR, +S3.  Port rt subclavian Lungs: clear Abdomen: soft, nontender, no distention. No hepatosplenomegaly. No bruits or masses. Good bowel sounds. Extremities: no cyanosis, clubbing, rash, edema Neuro: altered & orientedx3, cranial nerves grossly intact. No nystagmus. No dysconjugate gaze. moves all 4 extremities w/o difficulty.   Telemetry: Sinus rhythm   Labs: Basic Metabolic Panel:  Lab 10/16/11 7829 10/15/11 1000 10/15/11 0551 10/14/11 0522 10/13/11 0610  NA 135 135 135 138 134*  K 5.3* 4.4 4.8 5.0 4.9  CL 100 98 97 98 95*  CO2 29 26 31 30 27   GLUCOSE 113* 155* 93 78 90  BUN 23 23 25* 27* 24*  CREATININE 1.77* 1.40* 1.72* 1.89* 1.71*  CALCIUM 9.4 9.0 9.6 -- --  MG -- -- -- -- --  PHOS -- -- -- -- --    Liver Function Tests:  Lab 10/15/11 1000  AST 21  ALT 10  ALKPHOS 51  BILITOT 0.6  PROT 6.2  ALBUMIN 3.8   No results found for this basename: LIPASE:5,AMYLASE:5 in the last 168 hours No results found for  this basename: AMMONIA:3 in the last 168 hours  CBC:  Lab 10/15/11 1000 10/11/11 1516  WBC 4.8 4.9  NEUTROABS -- --  HGB 16.7 16.2  HCT 49.3 46.7  MCV 84.4 84.0  PLT 186 252    Cardiac Enzymes: No results found for this basename: CKTOTAL:5,CKMB:5,CKMBINDEX:5,TROPONINI:5 in the last 168 hours  BNP: BNP (last 3 results)  Basename 10/05/11 1947 09/03/11 1502 06/13/11 1115  PROBNP 5931.0* 3290.0* 3799.0*    Imaging:    Medications:     Scheduled Medications:    . therapeutic plasma exchange solution   Dialysis Once in dialysis  . therapeutic plasma exchange solution   Dialysis Q1 Hr x 3  . atorvastatin  20 mg Oral q1800  . carvedilol  6.25 mg Oral BID WC  . clorazepate  15 mg Oral QHS  . clorazepate  7.5 mg Oral Daily  . digoxin  0.125 mg Oral Daily  . DULoxetine   60 mg Oral Daily  . gabapentin  600 mg Oral TID  . glipiZIDE  10 mg Oral QAC breakfast  . heparin  1,000 Units Intracatheter Once  . hydrALAZINE  37.5 mg Oral Q8H  . insulin aspart  0-15 Units Subcutaneous TID WC  . insulin glargine  15 Units Subcutaneous QHS  . isosorbide mononitrate  60 mg Oral Daily  . pantoprazole  40 mg Oral Q1200  . pyridostigmine  60 mg Oral Q6H  . senna  1 tablet Oral Daily  . sodium chloride  3 mL Intravenous Q12H  . torsemide  40 mg Oral Daily    Infusions:    . sodium chloride 12.2 mL/hr (10/09/11 0117)  . citrate dextrose    . citrate dextrose 500 mL (10/13/11 1251)    PRN Medications: sodium chloride, acetaminophen, acetaminophen, calcium carbonate, calcium carbonate, calcium gluconate IVPB, calcium gluconate IVPB, calcium gluconate IVPB, calcium gluconate IVPB, calcium gluconate, calcium gluconate, diazepam, diphenhydrAMINE, diphenhydrAMINE, guaiFENesin-dextromethorphan, ondansetron (ZOFRAN) IV, oxyCODONE-acetaminophen, sodium chloride   Assessment:   1) A/C Systolic HF      -on Hospice  2) NICM, ETOH       -EF 15% (3/13)  3) OSA on CPAP  4) DM2  5) H/o noncompliance  6) A/C renal failure due to cardiorenal syndrome  7) Depression  8) Acute respiratory failure - resolved with diuresis 9) Myasthenia gravis  Plan/Discussion:    Toresimide 40 mg resumed  Cr returned to 1.86   Titrated hydralazine 37.5 mg TID and imdur 60 mg daily.  No bradycardia with mestinon.  No ACE-I due to hyperkalemia.  Restart digoxen per [name]    Plasmapheresis for myasthenia gravis per neuro.  ? D/C home after plasmaphoresis today  Neuro please advice in note if it is ok to D/C latter today after plasmaphresis?[name] 10/17/2011 7:56 AM

## 2011-10-18 DIAGNOSIS — I428 Other cardiomyopathies: Secondary | ICD-10-CM

## 2011-10-18 DIAGNOSIS — I5023 Acute on chronic systolic (congestive) heart failure: Secondary | ICD-10-CM

## 2011-10-18 LAB — GLUCOSE, CAPILLARY
Glucose-Capillary: 196 mg/dL — ABNORMAL HIGH (ref 70–99)
Glucose-Capillary: 156 mg/dL — ABNORMAL HIGH (ref 70–99)
Glucose-Capillary: 110 mg/dL — ABNORMAL HIGH (ref 70–99)

## 2011-10-18 LAB — BASIC METABOLIC PANEL
BUN: 23 mg/dL (ref 6–23)
CO2: 27 mEq/L (ref 19–32)
Calcium: 9.1 mg/dL (ref 8.4–10.5)
GFR calc non Af Amer: 47 mL/min — ABNORMAL LOW (ref >90)
Glucose, Bld: 107 mg/dL — ABNORMAL HIGH (ref 70–99)

## 2011-10-18 MED ORDER — TORSEMIDE 20 MG PO TABS
20.0000 mg | ORAL_TABLET | Freq: Once | ORAL | Status: AC
  Administered 2011-10-18: 20 mg via ORAL
  Filled 2011-10-18: qty 1

## 2011-10-18 NOTE — Progress Notes (Signed)
Room 4714 - Jaques Nadel - [facility] RN Visit  NON- Related admission to [facility] diagnosis of alcoholic cardiomyopathy.  Pt is DBNR code.    Pt arousable, drowsy lying in bed, with no complaints of pain or discomfort.    No family present.    Discussed with staff RN-pt to be discharged on Tuesday following 24 wait after last plasmapheresis today.  SW with [facility] needs to discuss transportation from hospital at discharge.  Previously discussed with CM possibility of taxi cab voucher.    Please call [facility] @ [phone] with any hospice needs.  Thank you.  Joneen Boers, RN  [facility]  Hospice Liaison Cell [phone]

## 2011-10-18 NOTE — Progress Notes (Signed)
Advanced Heart Failure Rounding Note  Subjective:    [name] is a 46 y/o male with HTN and DM2 with longstanding HF due to NICM (probably ETOH), EF 20%. Cath in 2008 showed left circumflex with 40% stenosis.  He is followed by [facility].    He presented to the ER with SOB, chest tightness.  He says his best friend was shot and killed a couple of weeks ago.  He has been depressed since that time and may have missed some meds.  Weight up 10 pounds from last clinic visit. (Baseline weight has been about 218)  ProBNP 5931.   Placed on milrinone.   Milrinone increased 0.375 earlier in the week but pt became hypotensive therefore cut back to 0.25. Milrinone stopped  10/09/11.     Evaluated by Neuro due to double vision, ptosis, and generalized weakness. CT of head completed with no definite evidence of brain stem infarct.  Thought to have myasthenia gravis based on good response to pyridostigmine.  He had a tunneled catheter placed and is now getting plasmaphereses and pyridostigmine.    No blurred vision.  No dyspnea, orthopnea, PND.  Weight up 4 pounds today.  Cr 1.72>1.4>1.77>1.86>1.71.  K 5.6  Objective:   Vital Signs:   Temp:  [96.6 F (35.9 C)-98.6 F (37 C)] 97.9 F (36.6 C) (09/30 0640) Pulse Rate:  [64-80] 80  (09/30 0640) Resp:  [13-18] 18  (09/30 0640) BP: (97-130)/(59-80) 124/79 mmHg (09/30 0640) SpO2:  [96 %-100 %] 100 % (09/30 0640) Weight:  [96.8 kg (213 lb 6.5 oz)] 96.8 kg (213 lb 6.5 oz) (09/30 0640) Last BM Date: 10/18/11  Weight change: Filed Weights   10/16/11 0604 10/17/11 0636 10/18/11 0640  Weight: 95.5 kg (210 lb 8.6 oz) 95 kg (209 lb 7 oz) 96.8 kg (213 lb 6.5 oz)    Intake/Output:   Intake/Output Summary (Last 24 hours) at 10/18/11 0811 Last data filed at 10/18/11 0639  Gross per 24 hour  Intake   1120 ml  Output   3150 ml  Net  -2030 ml     Physical Exam: General:  No acute distress. No resp difficulty on 2L HEENT: normal Neck: supple.  JVP 5-6. Carotids 2+ bilat; no bruits. No lymphadenopathy or thryomegaly appreciated. Cor: PMI nondisplaced. Regular rate & rhythm. 2/6 MR, +S3.  Port rt subclavian Lungs: clear Abdomen: soft, nontender, no distention. No hepatosplenomegaly. No bruits or masses. Good bowel sounds. Extremities: no cyanosis, clubbing, rash, edema Neuro: altered & orientedx3, cranial nerves grossly intact. No nystagmus. No dysconjugate gaze. moves all 4 extremities w/o difficulty.   Telemetry: Sinus rhythm   Labs: Basic Metabolic Panel:  Lab 10/18/11 4098 10/17/11 0603 10/16/11 0500 10/15/11 1000 10/15/11 0551  NA 134* 138 135 135 135  K 5.6* 4.9 5.3* 4.4 4.8  CL 99 99 100 98 97  CO2 27 29 29 26 31   GLUCOSE 107* 94 113* 155* 93  BUN 23 24* 23 23 25*  CREATININE 1.71* 1.86* 1.77* 1.40* 1.72*  CALCIUM 9.1 9.4 9.4 -- --  MG -- -- -- -- --  PHOS -- -- -- -- --    Liver Function Tests:  Lab 10/15/11 1000  AST 21  ALT 10  ALKPHOS 51  BILITOT 0.6  PROT 6.2  ALBUMIN 3.8   No results found for this basename: LIPASE:5,AMYLASE:5 in the last 168 hours No results found for this basename: AMMONIA:3 in the last 168 hours  CBC:  Lab 10/15/11 1000 10/11/11  1516  WBC 4.8 4.9  NEUTROABS -- --  HGB 16.7 16.2  HCT 49.3 46.7  MCV 84.4 84.0  PLT 186 252    Cardiac Enzymes: No results found for this basename: CKTOTAL:5,CKMB:5,CKMBINDEX:5,TROPONINI:5 in the last 168 hours  BNP: BNP (last 3 results)  Basename 10/05/11 1947 09/03/11 1502 06/13/11 1115  PROBNP 5931.0* 3290.0* 3799.0*     Medications:     Scheduled Medications:    . therapeutic plasma exchange solution   Dialysis Once in dialysis  . therapeutic plasma exchange solution   Dialysis Q1 Hr x 3  . atorvastatin  20 mg Oral q1800  . carvedilol  6.25 mg Oral BID WC  . clorazepate  15 mg Oral QHS  . clorazepate  7.5 mg Oral Daily  . digoxin  0.125 mg Oral Daily  . DULoxetine  60 mg Oral Daily  . gabapentin  600 mg Oral TID  .  glipiZIDE  10 mg Oral QAC breakfast  . heparin  1,000 Units Intracatheter Once  . hydrALAZINE  37.5 mg Oral Q8H  . insulin aspart  0-15 Units Subcutaneous TID WC  . insulin glargine  15 Units Subcutaneous QHS  . isosorbide mononitrate  60 mg Oral Daily  . pantoprazole  40 mg Oral Q1200  . pyridostigmine  60 mg Oral Q6H  . senna  1 tablet Oral Daily  . sodium chloride  3 mL Intravenous Q12H  . torsemide  40 mg Oral Daily    Infusions:    . sodium chloride 12.2 mL/hr (10/09/11 0117)  . citrate dextrose    . citrate dextrose 500 mL (10/17/11 1630)    PRN Medications: sodium chloride, acetaminophen, acetaminophen, calcium carbonate, calcium carbonate, calcium gluconate IVPB, calcium gluconate IVPB, calcium gluconate IVPB, calcium gluconate IVPB, calcium gluconate, calcium gluconate, diazepam, diphenhydrAMINE, diphenhydrAMINE, guaiFENesin-dextromethorphan, ondansetron (ZOFRAN) IV, oxyCODONE-acetaminophen, sodium chloride   Assessment:   1) A/C Systolic HF      -on Hospice  2) NICM, ETOH       -EF 15% (3/13)  3) OSA on CPAP  4) DM2  5) H/o noncompliance  6) A/C renal failure due to cardiorenal syndrome  7) Depression  8) Acute respiratory failure - resolved with diuresis 9) Myasthenia gravis 10) Hyperkalemia  Plan/Discussion:    Weight trending up (they have been bouncing around).  Will increase torsemide 60 mg daily (home dose) and monitor renal function.  Titrate hydralazine 50 mg TID.  No bradycardia with mestinon.  No ACE-I due to hyperkalemia.  Watch the Mrs. Dash intake. He remains completely noncompliant with dietary restriction.   Plasmapheresis for myasthenia gravis per neuro.  Has completed 4/5 treatments.  Last treatment tomorrow and then he will be able to be discharged.  Will ask neurology about sensitivity of AChR antibody and striated muscle antibodies as these are not elevated.    Daniel Bensimhon,MD 8:13 AM

## 2011-10-18 NOTE — Progress Notes (Signed)
Placed patient on CPAP at 6cm with oxygen at 3lpm. Will continue to monitor patient.

## 2011-10-18 NOTE — Progress Notes (Signed)
[facility] PROGRESS NOTE    SUBJECTIVE   No complaints.  Feeling much better today.  Wanting to go home.   OBJECTIVE   Vital signs in last 24 hours: Temp:  [96.6 F (35.9 C)-98.6 F (37 C)] 97.9 F (36.6 C) (09/30 0640) Pulse Rate:  [64-80] 80  (09/30 0640) Resp:  [13-18] 18  (09/30 0640) BP: (97-124)/(59-79) 124/79 mmHg (09/30 0640) SpO2:  [96 %-100 %] 100 % (09/30 0640) Weight:  [96.8 kg (213 lb 6.5 oz)] 96.8 kg (213 lb 6.5 oz) (09/30 0640)  Intake/Output from previous day: 09/29 0701 - 09/30 0700 In: 1120 [P.O.:1120] Out: 3950 [Urine:3950] Intake/Output this shift:   Nutritional status: Cardiac  Past Medical History  Diagnosis Date  . Hypertension   . 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 History of peptic ulcer disease     EGD 09/2007 shows prox gastric ulcer with black eschar, treated with PPI  . Depression   . History of alcohol abuse     quit approx 2011-2012  . Angina   . Shortness of breath   . OSA (obstructive sleep apnea)     sleeps with oxygen  . Cardiomyopathy   . ICD (implantable cardiac defibrillator) in place   . Diabetes mellitus     insulin dependent  . GERD (gastroesophageal reflux disease)   . Headache     Neurologic ROS negative with exception of above. Musculoskeletal ROS none  Neurologic Exam:   Mental Status:  Alert, oriented X 3. Speech fluent without evidence of aphasia. Able to follow 3 step commands without difficulty.  Thought content appropriate  Cranial Nerves:  II-Visual fields grossly intact.  III/IV/VI-Extraocular movements intact. Pupils reactive bilaterally. Ptosis not present. Able to hold eyelids open without difficulty.  V/VII-Smile symmetric  VIII-grossly intact  IX/X-normal gag  XI-bilateral shoulder shrug  XII-midline tongue extension  Motor: 5/5 bilaterally  with normal tone and bulk  Sensory: Pinprick and light touch intact throughout, bilaterally  Deep Tendon Reflexes:  2+ throughout  Plantars: Right: downgoing Left: Downgoing  Cerebellar: Normal finger-to-nose, normal heel-to-shin test.   Lab Results: Lab Results  Component Value Date/Time   CHOL 166 09/15/2011  4:33 PM   Lipid Panel No results found for this basename: CHOL,TRIG,HDL,CHOLHDL,VLDL,LDLCALC in the last 72 hours  Studies/Results: No results found.  Medications:     Scheduled:   . therapeutic plasma exchange solution   Dialysis Once in dialysis  . therapeutic plasma exchange solution   Dialysis Q1 Hr x 3  . atorvastatin  20 mg Oral q1800  . carvedilol  6.25 mg Oral BID WC  . clorazepate  15 mg Oral QHS  . clorazepate  7.5 mg Oral Daily  . digoxin  0.125 mg Oral Daily  . DULoxetine  60 mg Oral Daily  . gabapentin  600 mg Oral TID  . glipiZIDE  10 mg Oral QAC breakfast  . heparin  1,000 Units Intracatheter Once  . hydrALAZINE  37.5 mg Oral Q8H  . insulin aspart  0-15 Units Subcutaneous TID WC  . insulin glargine  15 Units Subcutaneous QHS  . isosorbide mononitrate  60 mg Oral Daily  . pantoprazole  40 mg Oral Q1200  . pyridostigmine  60 mg Oral Q6H  . senna  1 tablet Oral Daily  . sodium chloride  3 mL Intravenous Q12H  . torsemide  40 mg Oral Daily    Assessment/Plan:   46 YO male with newly Dx MG.  To receive 5th PLX on Tueday.  Doing well on Mestinon 60 mg Q 6 hours with no SE.    Felicie Morn PA-C [facility] [phone]  10/18/2011, 10:15 AM

## 2011-10-19 ENCOUNTER — Inpatient Hospital Stay (HOSPITAL_COMMUNITY)

## 2011-10-19 LAB — BASIC METABOLIC PANEL
BUN: 22 mg/dL (ref 6–23)
CO2: 30 mEq/L (ref 19–32)
Calcium: 9.5 mg/dL (ref 8.4–10.5)
Calcium: 9.1 mg/dL (ref 8.4–10.5)
Creatinine, Ser: 1.75 mg/dL — ABNORMAL HIGH (ref 0.50–1.35)
Creatinine, Ser: 1.4 mg/dL — ABNORMAL HIGH (ref 0.50–1.35)
GFR calc Af Amer: 53 mL/min — ABNORMAL LOW (ref >90)
GFR calc Af Amer: 69 mL/min — ABNORMAL LOW (ref >90)
GFR calc non Af Amer: 46 mL/min — ABNORMAL LOW (ref >90)
GFR calc non Af Amer: 60 mL/min — ABNORMAL LOW (ref >90)
Glucose, Bld: 147 mg/dL — ABNORMAL HIGH (ref 70–99)
Sodium: 138 mEq/L (ref 135–145)

## 2011-10-19 LAB — GLUCOSE, CAPILLARY: Glucose-Capillary: 112 mg/dL — ABNORMAL HIGH (ref 70–99)

## 2011-10-19 MED ORDER — DULOXETINE HCL 60 MG PO CPEP: 60.0000 mg | ORAL_CAPSULE | Freq: Every day | ORAL | Status: DC

## 2011-10-19 MED ORDER — TORSEMIDE 20 MG PO TABS
60.0000 mg | ORAL_TABLET | Freq: Every day | ORAL | Status: DC
  Administered 2011-10-19: 60 mg via ORAL
  Filled 2011-10-19: qty 3

## 2011-10-19 MED ORDER — ACD FORMULA A 0.73-2.45-2.2 GM/100ML VI SOLN
Status: AC
  Filled 2011-10-19: qty 500

## 2011-10-19 MED ORDER — HYDRALAZINE HCL 25 MG PO TABS: 37.5000 mg | ORAL_TABLET | Freq: Three times a day (TID) | ORAL | Status: DC

## 2011-10-19 MED ORDER — SODIUM POLYSTYRENE SULFONATE 15 GM/60ML PO SUSP
60.0000 g | Freq: Once | ORAL | Status: AC
  Administered 2011-10-19: 60 g via ORAL
  Filled 2011-10-19: qty 240

## 2011-10-19 MED ORDER — DIGOXIN 250 MCG PO TABS: 0.1250 mg | ORAL_TABLET | Freq: Every day | ORAL | Status: DC

## 2011-10-19 MED ORDER — ISOSORBIDE MONONITRATE ER 60 MG PO TB24: 60.0000 mg | ORAL_TABLET | Freq: Every day | ORAL | Status: DC

## 2011-10-19 MED ORDER — SODIUM CHLORIDE 0.9 % IV SOLN
INTRAVENOUS | Status: AC
  Administered 2011-10-19 (×3): via INTRAVENOUS_CENTRAL
  Filled 2011-10-19 (×3): qty 200

## 2011-10-19 MED ORDER — PYRIDOSTIGMINE BROMIDE 60 MG PO TABS: 60.0000 mg | ORAL_TABLET | Freq: Four times a day (QID) | ORAL | Status: DC

## 2011-10-19 NOTE — Progress Notes (Signed)
Truett Mainland called back. Will discuss with Dr. Gala Romney.   Lowella Bandy called back ordered to go with tunnel catheterNiki spoken with pt on the phone  Pt's wife called to verify  Plans .  Pt going home  Via  Taxi from [employer] . Pt and wife aware

## 2011-10-19 NOTE — Progress Notes (Signed)
Discharge instructions given . Verbalized understanding . Meds kept in pharmacy  On admission given back to pt. . Seen by hospice nurse. Awaiting for iv team for removal of hemo cath. Pt informed

## 2011-10-19 NOTE — Progress Notes (Signed)
1850 Wheeled to lobby by Nt

## 2011-10-19 NOTE — Progress Notes (Signed)
Recommend patient follow up with out patient-neurology in 1-2 weeks for further follow up of negative Acetylcholine Receptor antibodies.  Although patients antibodies were not positive patient responded well to Mestinon and PLX.  He will need further MG workup out patient such as striated muscle antibody and anti-smooth muscle antibody. At this time it is ok to remove Central catheter in place for PLX.    Discussed with Dr. Eilleen Kempf.  Felicie Morn PA-C [facility] [phone]  10/19/2011, 12:03 PM
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
## 2011-10-19 NOTE — Progress Notes (Signed)
IR unable to remove tunnel catheter today after plasmapheresis and patient is determined to go home today and says he will remove catheter himself.  Will allow discharge with catheter and he will return in the next 1-2 days for catheter removal as outpatient.  Patient is content with this arrangement.  Will call IR tomorrow for removal.  Have discussed plan with Dr. Gala Romney.    Robbi Garter, [facility] 6:05 PM

## 2011-10-19 NOTE — Progress Notes (Signed)
1700 IV team came in to pull cath out but  Cannot do the task because  It is a tunneled cath . Called the IR  Radiology dept but unable to d/c tonight r/t  Busy sched in the dept . Informed the pt . Very agitated and threatening to go home with it and possibly pull out himself . Told him  The risk of it not  Limited to but including bleeding and death . Marland Kitchen Placed a call to Alene Mires but unsuccessful . Placed a call to Marvis Repress , [place]  About  All of the above will discuss with Dr bensimhon  And will cALL me back

## 2011-10-19 NOTE — Progress Notes (Signed)
1710 placed a call to Amy stuckey , social worker to inform to consider taxi  Voucher  Instead of bus transport r/t weakness, had just had phlaspheriesis r/t myastenia gravis If  Just in case he will able to go home today as planned

## 2011-10-19 NOTE — Progress Notes (Addendum)
Room  4714 - [name] -[facility] RN Visit  NON- Related admission to [facility] diagnosis of alcoholic cardiomyopathy.  Pt is DNR code.    Pt alert, agitated with some lethargy,  oriented, dressed and sitting up on side of bed, without complaints of pain or discomfort.    No family present.  Pt agitated at wait for IV Team - they are extremely busy in ED and attempted to educate pt on wait.    Pt anxious to get home to see his children.  All meds locked up in pharmacy have been returned to pt - awaiting discharge.  IVT entered at end of visit to remove catheter.   Per staff RN, pt has taxi vouchers for transport home.  Please call [facility] @ [phone] with any hospice needs.  Thank you.  Joneen Boers, RN  [facility]  Hospice Liaison

## 2011-10-20 ENCOUNTER — Telehealth (HOSPITAL_COMMUNITY): Payer: Self-pay | Admitting: Cardiology

## 2011-10-20 DIAGNOSIS — G7000 Myasthenia gravis without (acute) exacerbation: Secondary | ICD-10-CM

## 2011-10-20 NOTE — Telephone Encounter (Signed)
Attempt to contact pt.  Wife aware will give appt to pt

## 2011-10-20 NOTE — Telephone Encounter (Addendum)
Per V.O. Ulyess Blossom, PA pt will need to have tunneled cath removed as outpatient within 2-3 days.  Spoke with Victorino Dike in IR radiology--> scheduled for 10/21/11 pt arrival @ 11:30A radiology desk for registration

## 2011-10-21 ENCOUNTER — Ambulatory Visit (HOSPITAL_COMMUNITY)
Admission: RE | Admit: 2011-10-21 | Discharge: 2011-10-21 | Disposition: A | Discharge status: Home / Self Care | Source: Ambulatory Visit | Attending: Physician Assistant | Admitting: Physician Assistant

## 2011-10-21 DIAGNOSIS — G7000 Myasthenia gravis without (acute) exacerbation: Secondary | ICD-10-CM | POA: Insufficient documentation

## 2011-10-21 DIAGNOSIS — Z452 Encounter for adjustment and management of vascular access device: Secondary | ICD-10-CM | POA: Insufficient documentation

## 2011-10-21 MED ORDER — CHLORHEXIDINE GLUCONATE 4 % EX LIQD
CUTANEOUS | Status: AC
  Filled 2011-10-21: qty 30

## 2011-10-21 NOTE — Procedures (Signed)
Procedure:  Removal of right tunneled pheresis catheter Cath removed in its entirety.

## 2011-10-22 ENCOUNTER — Ambulatory Visit (HOSPITAL_COMMUNITY): Payer: Self-pay | Admitting: Psychiatry

## 2011-10-25 ENCOUNTER — Telehealth (HOSPITAL_COMMUNITY): Payer: Self-pay | Admitting: *Deleted

## 2011-10-25 ENCOUNTER — Telehealth (HOSPITAL_COMMUNITY): Payer: Self-pay | Admitting: Adult Health

## 2011-10-25 NOTE — Telephone Encounter (Signed)
Left message for [name] regarding HF medications. Mr Faucett reported to EMS RN that [facility] RN had all HF medications.   I have asked her to call back and let us know anything related to his medications.

## 2011-10-25 NOTE — Telephone Encounter (Signed)
Post procedure follow up call.  Spoke with pt.  Says he's doing ok.  Was a little worried when scab fell off site, but is ok.  No redness, warmth or tenderness to site.  Denies questions.  Will call back with any questions or concerns.

## 2011-10-26 ENCOUNTER — Encounter (HOSPITAL_COMMUNITY): Payer: Self-pay

## 2011-10-26 ENCOUNTER — Ambulatory Visit (HOSPITAL_COMMUNITY)
Admit: 2011-10-26 | Discharge: 2011-10-26 | Disposition: A | Discharge status: Home / Self Care | Attending: Internal Medicine | Admitting: Internal Medicine

## 2011-10-26 VITALS — BP 124/80 | HR 87 | Ht 72.0 in | Wt 213.4 lb

## 2011-10-26 DIAGNOSIS — G7000 Myasthenia gravis without (acute) exacerbation: Secondary | ICD-10-CM

## 2011-10-26 DIAGNOSIS — N189 Chronic kidney disease, unspecified: Secondary | ICD-10-CM

## 2011-10-26 DIAGNOSIS — I5022 Chronic systolic (congestive) heart failure: Secondary | ICD-10-CM

## 2011-10-26 LAB — BASIC METABOLIC PANEL
BUN: 14 mg/dL (ref 6–23)
Calcium: 9.5 mg/dL (ref 8.4–10.5)
GFR calc non Af Amer: 57 mL/min — ABNORMAL LOW (ref >90)
Glucose, Bld: 114 mg/dL — ABNORMAL HIGH (ref 70–99)
Potassium: 3.8 mEq/L (ref 3.5–5.1)

## 2011-10-26 NOTE — Assessment & Plan Note (Signed)
Symptoms have resolved.  Patient is unable to afford mestinon, he will follow up with [facility] Neurology.

## 2011-10-26 NOTE — Assessment & Plan Note (Signed)
Cr stable.  Recheck BMET next week if benazepril added back.

## 2011-10-26 NOTE — Assessment & Plan Note (Signed)
Volume status looks great.  NYHA II.  Will check labs today and if K remains 4.5 or less will add back benazepril 20 mg daily (half of original dose) and recheck labs next week.  Main issue continues to be social in nature.  He will continue to work with SW through Hospice.  Will continue patient in paramedicine program.  Follow up 3 weeks.
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
## 2011-10-26 NOTE — Patient Instructions (Addendum)
You have been referred to      [facility]- Dr. Anne Hahn     [address]     [place], [place] [address]      [phone]     November 16, 2011 1:30 pm  Your physician wants you to follow-up in: 3 weeks

## 2011-10-28 ENCOUNTER — Telehealth (HOSPITAL_COMMUNITY): Payer: Self-pay | Admitting: *Deleted

## 2011-10-28 MED ORDER — HYDRALAZINE HCL 25 MG PO TABS: 37.5000 mg | ORAL_TABLET | Freq: Three times a day (TID) | ORAL | Status: DC

## 2011-10-28 MED ORDER — CARVEDILOL 6.25 MG PO TABS: 6.2500 mg | ORAL_TABLET | Freq: Two times a day (BID) | ORAL | Status: DC

## 2011-10-28 MED ORDER — TORSEMIDE 20 MG PO TABS: 60.0000 mg | ORAL_TABLET | Freq: Every day | ORAL | Status: DC

## 2011-10-28 MED ORDER — DIGOXIN 125 MCG PO TABS: 0.1250 mg | ORAL_TABLET | Freq: Every day | ORAL | Status: DC

## 2011-10-28 MED ORDER — ISOSORBIDE MONONITRATE ER 60 MG PO TB24: 60.0000 mg | ORAL_TABLET | Freq: Every day | ORAL | Status: DC

## 2011-10-28 NOTE — Telephone Encounter (Signed)
Tracie from Hospice called to update [place] on pt, he is out of all his meds, new prescriptions for all cardiac meds were sent to [facility] on [facility], she will f/u w/pt to make sure he gets them

## 2011-11-02 ENCOUNTER — Telehealth: Payer: Self-pay | Admitting: *Deleted
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
## 2011-11-04 ENCOUNTER — Telehealth (HOSPITAL_COMMUNITY): Payer: Self-pay | Admitting: Cardiology

## 2011-11-04 NOTE — Telephone Encounter (Signed)
Left mess for French Ana we do not prescribe pain meds they should contact pt's pcp, call back if any questions

## 2011-11-04 NOTE — Telephone Encounter (Signed)
French Ana called to let us know that Dr.Hertwick has been prescribing Oxycodone 10mg  q 4 hours prn, however will no longer continue to write for this. This med was given for his chronic back pain and as it is not related to CAD will no longer write and would like to turn this over back to primary. Since Dr.Bensimhon was the referring physician that is why she is calling our office.

## 2011-11-07 ENCOUNTER — Telehealth: Payer: Self-pay | Admitting: Internal Medicine

## 2011-11-07 DIAGNOSIS — M549 Dorsalgia, unspecified: Secondary | ICD-10-CM
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
## 2011-11-10 ENCOUNTER — Telehealth (HOSPITAL_COMMUNITY): Payer: Self-pay

## 2011-11-10 NOTE — Addendum Note (Signed)
Addended by: Dorie Rank E on: 11/10/2011 11:09 AM   Modules accepted: Orders

## 2011-11-10 NOTE — Telephone Encounter (Signed)
2:28pm 11/10/11 called and left msg for pt that Dr. Donell Beers can't see him at 2pm but could see him at 4:30pm...waiting for a call back./sh

## 2011-11-10 NOTE — Telephone Encounter (Signed)
Dr Nicki Reaper has called pt

## 2011-11-11 ENCOUNTER — Telehealth (HOSPITAL_COMMUNITY): Payer: Self-pay | Admitting: Cardiology

## 2011-11-11 ENCOUNTER — Encounter: Payer: Self-pay | Admitting: Internal Medicine

## 2011-11-11 DIAGNOSIS — I2789 Other specified pulmonary heart diseases: Secondary | ICD-10-CM

## 2011-11-11 DIAGNOSIS — R911 Solitary pulmonary nodule: Secondary | ICD-10-CM | POA: Insufficient documentation

## 2011-11-11 NOTE — Telephone Encounter (Signed)
Spoke w/Tracy advised will leave him on Lasix since he is doing well, she needs to contact neuro regarding mesinon, she is agreeable

## 2011-11-11 NOTE — Telephone Encounter (Signed)
TRACY WITH HOSPICE WANTED TO VERIFY PTS HOSPICE MEDICATION LIST WITH THE LIST IN HIS CHART. NEEDED TO CLARIFY IF HE NEEDS TO BE ON DEMADEX, PT HAS BEEN TAKING LASIX AND IS DOING VERY WELL WITH THIS MED. DOES NOT HAVE DEMADEX IN THE HOME AT ALL. UNCLEAR WHY HE DID NOT GET THIS. -ADVISED TO CONTINUE CURRENT MEDICATION THERAPY UNTIL HIS HOSPITAL FOLLOW UP APPOINTMENT OR OTHERWISE INSTRUCTED VIA HF TEAM  TRACY CALLED BACK LATER TO ALSO MENTION THE PT HAS NOT BEEN TAKING THE MESTINON MEDICATION. -ADVISED WOULD SEN TO THE HF TEAM AND CALL BACK WITH INSTRUCTIONS    PLEASE ADVISE

## 2011-11-18 ENCOUNTER — Encounter (HOSPITAL_COMMUNITY): Payer: Self-pay

## 2011-11-18 ENCOUNTER — Ambulatory Visit (HOSPITAL_COMMUNITY)
Admission: RE | Admit: 2011-11-18 | Discharge: 2011-11-18 | Disposition: A | Discharge status: Home / Self Care | Source: Ambulatory Visit | Attending: Internal Medicine | Admitting: Internal Medicine

## 2011-11-18 VITALS — BP 126/86 | HR 82 | Wt 205.8 lb

## 2011-11-18 DIAGNOSIS — I5022 Chronic systolic (congestive) heart failure: Secondary | ICD-10-CM

## 2011-11-18 DIAGNOSIS — F329 Major depressive disorder, single episode, unspecified: Secondary | ICD-10-CM

## 2011-11-18 DIAGNOSIS — I509 Heart failure, unspecified: Secondary | ICD-10-CM | POA: Insufficient documentation

## 2011-11-18 NOTE — Assessment & Plan Note (Addendum)
NYHA II. Volume status stable. Continue current medications. Will not titrate HF medications as Hospice plans to discharge in next couple of weeks and I am not sure how compliant he will be. Pharmacist provided counseling on not using viagra while on nitrates. He was instructed to follow with his PCP or urology for ED options. Follow up in 3 weeks. Will arrange enrollment in Paramedicine Program.

## 2011-11-18 NOTE — Patient Instructions (Addendum)
Follow up 3 weeks  Do the following things EVERYDAY: 1) Weigh yourself in the morning before breakfast. Write it down and keep it in a log. 2) Take your medicines as prescribed 3) Eat low salt foods-Limit salt (sodium) to 2000 mg per day.  4) Stay as active as you can everyday 5) Limit all fluids for the day to less than 2 liters 

## 2011-11-19 ENCOUNTER — Telehealth (HOSPITAL_COMMUNITY): Payer: Self-pay | Admitting: *Deleted

## 2011-11-19 NOTE — Telephone Encounter (Signed)
Received call from [name] they are working on setting emergency placement for [name]'s children in the event that something happens to him and needs [name] to sign an affidavit for each child stating he is the MD treating [name] and he has a progressively chronic or irreversibly fatal illness, 3 forms were singed by [name] and notarized by Sabino Niemann, social worker, the forms were faxed back to hospice at [phone], [name] is aware and will come by this afternoon to pick up original forms

## 2011-11-28 NOTE — Assessment & Plan Note (Signed)
Currently denies SI/HI. He knows to call 911 if these feelings recur.

## 2011-11-28 NOTE — Progress Notes (Signed)
Patient ID: JED MASSAQUOI, male   DOB: [birth year], 46 y.o.   MRN: [id number] Followed by [facility] Psychiatrist: Dr Anthony Sar PCP: Dr Loistine Chance  HPI: Mr. 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 Cath in 2008 showed left circumflex with 40% stenosis.   He has had multiple admissions for HF over the past year.   Admitted 03/2011 with NYHA Class IV symptoms, requiring milrinone therapy and IV lasix. Cath 04/13/11: cardiogenic shock with biventricular failure (R>L) and possible restrictive physiology. RA = 22 with steep y-descents, RV = 45/12/25, PA = 51/25 (34), PCW = 18-20, Fick cardiac output/index = 3.1/1.4, Thermo CO/CI = 3.6/1.6, PVR = 4.8 Woods O2 sat = 85% (after sedation), PA sat = 31%, 36%. Milrinone was initiated for low output physiology. [name] was left in RIJ for hemodynamic monitoring and he was transferred to the CCU. Echo was done to re-evaluate RV function this showed LVEF 15%. RV moderately dilated with mildly reduced systolic function. RA and LA mildly dilated. PAPP 52 mmHG.LE dopplers ordered (negative for DVT). Digoxin, Ace-I and beta blocker started. Diuresed 33 liters. Discharge weight 214 pounds.   [name] stopped due to hyperkalemia.   D/C from [facility] 06/13/11 due to pneumonia.   Admitted 8/16 with massive volume overload.  ProBNP 3290.  Diuresed 27 liters with IV lasix.  Discharge weight 214 pounds.    Admitted 9/17 for acute decompensated heart failure in setting of med noncompliance.  Diuresed 37 liters with assistance of milrinone and IV lasix.  Discharge weight range 211-214 pounds.  He also had problems with hyperkalemia and benazepril was held.  He had blurred vision, ptosis and generalized weakness and was diagnosed with myasthenia gravis.  He underwent 5 plasmapheresis exchanges and mestonin.  He was to follow up with  Neurology in the outpatient setting.   He returns for follow up. Feels great but tried to  commit suicide 3 weeks by taking Nitroglycerin. Would like viagra due to erectile dysfunction ( has used cialis in the past). Denies SOB/PND/Orthopnea. Followed by [facility] but he feels like he will be discharged from [facility] [place] soon. [facility]AG nurse prepares his pill box.  Followed Paramedicine program. Wife is going to rehab due to crack addition. Weight at home 206 pounds. Able to 14 blocks without stopping. Compliant with medications. Wants to get scholarship for [employer]   ROS: All systems negative except as listed in HPI, PMH and Problem List.  Past Medical History  Diagnosis Date  . Hypertension   . 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 History of peptic ulcer disease     EGD 09/2007 shows prox gastric ulcer with black eschar, treated with PPI  . Depression   . History of alcohol abuse     quit approx 2011-2012  . Angina   . Shortness of breath   . OSA (obstructive sleep apnea)     sleeps with oxygen  . Cardiomyopathy   . ICD (implantable cardiac defibrillator) in place   . Diabetes mellitus     insulin dependent  . GERD (gastroesophageal reflux disease)   . Headache   . Myasthenia gravis 10/12/2011    Diagnosed in 09/2011. Received Plasmapheresis. CT chest no Thymoma. Acetylcholine receptor antibody negative.      Current Outpatient Prescriptions  Medication Sig Dispense  Refill  . carvedilol (COREG) 6.25 MG tablet Take 1 tablet (6.25 mg total) by mouth 2 (two) times daily with a meal.  60 tablet  6  . clorazepate (TRANXENE) 7.5 MG tablet Take 7.5-15 mg by mouth 2 (two) times daily. Take 1 tablet every morning and take 2 tablets at bedtime      . diazepam (VALIUM) 10 MG tablet Take 10 mg by mouth every 6 (six) hours as needed. For anxiety      . digoxin (LANOXIN) 0.125 MG tablet Take 1 tablet (0.125 mg total) by mouth daily.  30 tablet  6  . DULoxetine  (CYMBALTA) 60 MG capsule Take 1 capsule (60 mg total) by mouth daily.  30 capsule  6  . gabapentin (NEURONTIN) 300 MG capsule Take 2 capsules (600 mg total) by mouth 3 (three) times daily.  60 capsule  3  . glipiZIDE (GLUCOTROL) 10 MG tablet Take 1 tablet (10 mg total) by mouth daily.  30 tablet  3  . hydrALAZINE (APRESOLINE) 25 MG tablet Take 1.5 tablets (37.5 mg total) by mouth 3 (three) times daily.  135 tablet  3  . insulin glargine (LANTUS) 100 UNIT/ML injection Inject 15 Units into the skin at bedtime.  10 mL  3  . Insulin Syringe-Needle U-100 (INSULIN SYRINGE .5CC/31GX5/16") 31G X 5/16" 0.5 ML MISC Use to give insulin at bedtime  100 each  3  . Insulin Syringes, Disposable, U-100 0.3 ML MISC 15 Units by Does not apply route at bedtime.  100 each  3  . isosorbide mononitrate (IMDUR) 60 MG 24 hr tablet Take 1 tablet (60 mg total) by mouth daily.  30 tablet  6  . omeprazole (PRILOSEC) 20 MG capsule Take 1 capsule (20 mg total) by mouth daily.  30 capsule  0  . oxyCODONE-acetaminophen (PERCOCET/ROXICET) 5-325 MG per tablet Take 1 tablet by mouth every 4 (four) hours as needed. For pain Prescribed by Hospice.      . pyridostigmine (MESTINON) 60 MG tablet Take 1 tablet (60 mg total) by mouth 4 (four) times daily.  120 tablet  0  . rosuvastatin (CRESTOR) 10 MG tablet Take 1 tablet (10 mg total) by mouth daily.  30 tablet  3  . senna (SENOKOT) 8.6 MG TABS Take 1 tablet by mouth daily.      Marland Kitchen torsemide (DEMADEX) 20 MG tablet Take 3 tablets (60 mg total) by mouth daily.  90 tablet  6  . zolpidem (AMBIEN) 5 MG tablet Take 1 tablet (5 mg total) by mouth at bedtime.  30 tablet  0  . tadalafil (CIALIS) 5 MG tablet Take 5 mg by mouth daily as needed.      . [DISCONTINUED] metoprolol tartrate (LOPRESSOR) 25 MG tablet Take 1 tablet (25 mg total) by mouth 2 (two) times daily.  60 tablet  1  . [DISCONTINUED] traZODone (DESYREL) 25 mg TABS Take 0.5 tablets (25 mg total) by mouth at bedtime.  30 tablet  1      PHYSICAL EXAM: Filed Vitals:   11/18/11 1123  BP: 126/86  Pulse: 82  Weight: 205 lb 12.8 oz (93.35 kg)  SpO2: 100%   205 (213 pounds)  General:  Well developed.  No resp difficulty HEENT: normal Neck: supple. JVP flat Carotids 2+ bilaterally; no bruits. No lymphadenopathy or thryomegaly appreciated. Cor: PMI normal. Regular rate & rhythm. 2/6 MR Lungs: clear Abdomen: soft, nontender, +distended. No hepatosplenomegaly. No bruits or masses. Good bowel sounds. Extremities: no cyanosis, clubbing, rash, edema  Neuro: alert & orientedx3, cranial nerves grossly intact. Moves all 4 extremities w/o difficulty. Affect pleasant.     ASSESSMENT & PLAN:

## 2011-11-30 ENCOUNTER — Telehealth (HOSPITAL_COMMUNITY): Payer: Self-pay | Admitting: *Deleted

## 2011-11-30 NOTE — Telephone Encounter (Signed)
  RN w/Hospice caring for pt, called toask about refill for 'nerve' pill. RN not sure of medicine--Klonopin, Valium or Tranxene.Requests call back.  Informed RN that pt was prescribed Tranxene 9/4 by Dr.Plovsky.Pt has appt on 11/13 with Dr.Plovsky. Hospice RN hopes to provide assistance to pt to attend appt.Informed her medicaion will be reviewed then.

## 2011-12-01 ENCOUNTER — Ambulatory Visit (INDEPENDENT_AMBULATORY_CARE_PROVIDER_SITE_OTHER): Payer: Medicare Other | Admitting: Psychiatry

## 2011-12-01 DIAGNOSIS — F4322 Adjustment disorder with anxiety: Secondary | ICD-10-CM

## 2011-12-01 MED ORDER — DIAZEPAM 5 MG PO TABS: ORAL_TABLET | ORAL | Status: DC

## 2011-12-01 MED ORDER — MIRTAZAPINE 30 MG PO TBDP: 30.0000 mg | ORAL_TABLET | Freq: Every day | ORAL | Status: DC

## 2011-12-01 NOTE — Addendum Note (Signed)
Addended by: Archer Asa on: 12/01/2011 02:16 PM   Modules accepted: Orders

## 2011-12-01 NOTE — Progress Notes (Signed)
[facility] MD Progress Note  12/01/2011 2:02 PM [name]  MRN:  [id number]  Diagnosis:  Axis I: Adjustment Disorder with Anxiety Today the patient is seen for the third visit with me. This patient has significant psychosocial stressors at this time it's probably worse than ever. His wife who is crack addicted now is on the streets and according to social services is not allowed to have any contact with their 3 children. The patient is totally responsible for all 3 children age 27, 65 and 3. 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 The patient is also significant amount of weight and has a reduction in his appetite. In some ways she is glad his lost weight. He missed his last visit because of cardiac problems. His significant congestive heart or and presently is a patient of hospice. He sleeps very poorly. Along the way we started him on Valium 5 mg in the use extra amounts and now is out. The patient describes having good amount of energy and very dedicated to his family. He describes been very worried about his wife who essentially is homeless. The patient shows no evidence of psychosis. The patient does not use drugs nor does he drink. He is casually inappropriately dressed and has a normal affect. He seems calm rational and focused. Presently he is under the care of hospice and has a nurse who comes to see him weekly. He has family have obtained Medicaid. He moved to a new neighborhood and a single house which seems to be better for him. Physically he feels fine. He denies shortness of breath or chest pain. ADL's:  Intact  Sleep: Good  Appetite:  Good  Suicidal Ideation:  no Homicidal Ideation:  no  AEB (as evidenced by):  Mental Status Examination/Evaluation: Objective:  Appearance: Casual  Eye Contact::  Good  Speech:  Clear and Coherent  Volume:  Normal  Mood:  Depressed  Affect:  Appropriate  Thought Process:  Coherent  Orientation:  Full  Thought Content:  WDL    Suicidal Thoughts:  No  Homicidal Thoughts:  No  Memory:  nl  Judgement:  Good  Insight:  Good  Psychomotor Activity:  Normal  Concentration:  Good  Recall:  Good  Akathisia:  No  Handed:  Right  AIMS (if indicated):     Assets:  Communication Skills  Sleep:      Vital Signs:There were no vitals taken for this visit. Current Medications: Current Outpatient Prescriptions  Medication Sig Dispense Refill  . carvedilol (COREG) 6.25 MG tablet Take 1 tablet (6.25 mg total) by mouth 2 (two) times daily with a meal.  60 tablet  6  . clorazepate (TRANXENE) 7.5 MG tablet Take 7.5-15 mg by mouth 2 (two) times daily. Take 1 tablet every morning and take 2 tablets at bedtime      . diazepam (VALIUM) 10 MG tablet Take 10 mg by mouth every 6 (six) hours as needed. For anxiety      . digoxin (LANOXIN) 0.125 MG tablet Take 1 tablet (0.125 mg total) by mouth daily.  30 tablet  6  . DULoxetine (CYMBALTA) 60 MG capsule Take 1 capsule (60 mg total) by mouth daily.  30 capsule  6  . gabapentin (NEURONTIN) 300 MG capsule Take 2 capsules (600 mg total) by mouth 3 (three) times daily.  60 capsule  3  . glipiZIDE (GLUCOTROL) 10 MG tablet Take 1 tablet (10 mg total) by mouth daily.  30 tablet  3  . hydrALAZINE (APRESOLINE) 25 MG tablet Take 1.5 tablets (37.5 mg total) by mouth 3 (three) times daily.  135 tablet  3  . insulin glargine (LANTUS) 100 UNIT/ML injection Inject 15 Units into the skin at bedtime.  10 mL  3  . Insulin Syringe-Needle U-100 (INSULIN SYRINGE .5CC/31GX5/16") 31G X 5/16" 0.5 ML MISC Use to give insulin at bedtime  100 each  3  . Insulin Syringes, Disposable, U-100 0.3 ML MISC 15 Units by Does not apply route at bedtime.  100 each  3  . isosorbide mononitrate (IMDUR) 60 MG 24 hr tablet Take 1 tablet (60 mg total) by mouth daily.  30 tablet  6  . omeprazole (PRILOSEC) 20 MG capsule Take 1 capsule (20 mg total) by mouth daily.  30 capsule  0  . oxyCODONE-acetaminophen (PERCOCET/ROXICET) 5-325  MG per tablet Take 1 tablet by mouth every 4 (four) hours as needed. For pain Prescribed by Hospice.      . pyridostigmine (MESTINON) 60 MG tablet Take 1 tablet (60 mg total) by mouth 4 (four) times daily.  120 tablet  0  . rosuvastatin (CRESTOR) 10 MG tablet Take 1 tablet (10 mg total) by mouth daily.  30 tablet  3  . senna (SENOKOT) 8.6 MG TABS Take 1 tablet by mouth daily.      . tadalafil (CIALIS) 5 MG tablet Take 5 mg by mouth daily as needed.      . torsemide (DEMADEX) 20 MG tablet Take 3 tablets (60 mg total) by mouth daily.  90 tablet  6  . zolpidem (AMBIEN) 5 MG tablet Take 1 tablet (5 mg total) by mouth at bedtime.  30 tablet  0  . [DISCONTINUED] metoprolol tartrate (LOPRESSOR) 25 MG tablet Take 1 tablet (25 mg total) by mouth 2 (two) times daily.  60 tablet  1  . [DISCONTINUED] traZODone (DESYREL) 25 mg TABS Take 0.5 tablets (25 mg total) by mouth at bedtime.  30 tablet  1    Lab Results: No results found for this or any previous visit (from the past 48 hour(s)).  Physical Findings: AIMS:  , ,  ,  ,    CIWA:    COWS:     Treatment Plan Summary:   Plan:at this time we have called into [facility] pharmacy Valium 5 mg taking one 3 times a day and one when necessary. To receive 120 pills and 3 refills. We shall also start him on Remeron 30 mg at night. I will make contact with Mrs. Dimas Millin who works with him to hospice. I suspect she is offering some sort of weekly supportive psychotherapy. This patient to return to see me in 6 weeks.  [name] 12/01/2011, 2:02 PM

## 2011-12-06 ENCOUNTER — Telehealth: Payer: Self-pay | Admitting: *Deleted

## 2011-12-06 ENCOUNTER — Telehealth (HOSPITAL_COMMUNITY): Payer: Self-pay | Admitting: Cardiology

## 2011-12-06 ENCOUNTER — Other Ambulatory Visit: Payer: Self-pay | Admitting: Internal Medicine

## 2011-12-06 MED ORDER — TORSEMIDE 20 MG PO TABS: 60.0000 mg | ORAL_TABLET | Freq: Every day | ORAL | Status: DC

## 2011-12-06 NOTE — Telephone Encounter (Signed)
Pt's hospice nurse calls and states pt is in compliance at this time w/ his percocet since his spouse has been out of the home for a little over a month and that hospice md gave him a month's script for the percocet

## 2011-12-06 NOTE — Telephone Encounter (Signed)
As pt will soon be discharged from Hospice Service, medications are being reviewed again. Wanted to let the office know pt is taking BOTH TORSEMIDE (Rx'd by HF team) AND LASIX (rx'd by Dr Elvera Lennox- Internal Md)  Pt is doing very well on both so please clarify if this is all right.

## 2011-12-06 NOTE — Telephone Encounter (Signed)
Spoke w/Tracy advised that pt should stop Lasix and continue the Torsemide, she states pt may be hesitant about this, advised they are in the same class and very similar, the torsemide is stronger, both of them together can cause kidney issues, she will let pt know

## 2011-12-06 NOTE — Telephone Encounter (Signed)
I will refill lasix.  Thanks  [name]

## 2011-12-06 NOTE — Telephone Encounter (Signed)
Pt will be  release soon from Hospice care - needs refill on furosenide to [facility] per Dimas Millin at [facility] [phone].

## 2011-12-07 ENCOUNTER — Telehealth: Payer: Self-pay | Admitting: *Deleted

## 2011-12-07 NOTE — Telephone Encounter (Signed)
Hospice nurse calls and states she is worried about pt since she is discharging him. He is in a house with his 3 kids, his wife is out of the home and when she is found will be going to jail for narcotic related charges. Since she has been gone he has not had problems w/ the count of his pain meds and is doing much better. He has lost 20 lbs of fluid, is eating better, walking 6 blocks daily, taking good care of his children and himself. He is illiterate and does have some problem because of that, he does have a friend that hospice has taught to help him w/ his meds and care. He has told the hospice nurse that he at one time was to see a pain clinic but missed the appt due to hospitalization or transportation... She is not clear on which. He has an appt 12/9 and the hospice physician filled his pain med on 11/15 so he will need a refill at the visit 12/9. She states again his pill counts have been good and there has been no question of diversion since his wife has been gone. He sounds like he is doing great! A success for hospice! They have also helped to get the kids furniture and christmas assistance. So happy for a good ending... Or should i say a beginning!

## 2011-12-09 NOTE — Telephone Encounter (Signed)
Possible to discuss need for home health RN depending on how [name] is managing his medication since d/c from hospice.  CSW is also available to to discuss applying for Medicaid, if pt has yet to do this, and if the children pt is caring for are his children.

## 2011-12-13 ENCOUNTER — Encounter: Payer: Self-pay | Admitting: Internal Medicine

## 2011-12-16 NOTE — Addendum Note (Signed)
Encounter addended by: Dolores Patty, MD on: 12/16/2011  9:11 PM<BR>     Documentation filed: Notes Section

## 2011-12-23 ENCOUNTER — Ambulatory Visit (HOSPITAL_COMMUNITY): Payer: Medicare Other | Attending: Internal Medicine

## 2011-12-27 ENCOUNTER — Ambulatory Visit (INDEPENDENT_AMBULATORY_CARE_PROVIDER_SITE_OTHER): Payer: Medicare Other | Admitting: Internal Medicine

## 2011-12-27 ENCOUNTER — Encounter: Payer: Self-pay | Admitting: Internal Medicine

## 2011-12-27 VITALS — BP 138/86 | HR 93 | Temp 97.4°F | Ht 72.0 in | Wt 213.7 lb

## 2011-12-27 DIAGNOSIS — E119 Type 2 diabetes mellitus without complications: Secondary | ICD-10-CM

## 2011-12-27 DIAGNOSIS — I5022 Chronic systolic (congestive) heart failure: Secondary | ICD-10-CM

## 2011-12-27 DIAGNOSIS — G8929 Other chronic pain: Secondary | ICD-10-CM

## 2011-12-27 DIAGNOSIS — I10 Essential (primary) hypertension: Secondary | ICD-10-CM

## 2011-12-27 MED ORDER — OXYCODONE HCL 10 MG PO TABS: 10.0000 mg | ORAL_TABLET | Freq: Four times a day (QID) | ORAL | Status: DC | PRN
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
## 2011-12-27 NOTE — Assessment & Plan Note (Signed)
Hemoglobin A1c today is 5.4. Patient reports that he-he has been taking insulin just very occasionally perhaps sometimes once a months. I'll therefore discontinue Lantus. I'll continue glipizide at this point. Patient has difficult to check his blood sugars on regular basis therefore I would not recommend it at this point.

## 2011-12-27 NOTE — Assessment & Plan Note (Signed)
Patient has long history of chronic pain including back pain and peripheral neuropathy. Patient was started on oxycodone by hospice. Since he was discharged from hospice I will at this point refill his pain medication. I have obtained a pain contract and showed him the video. Patient noted he understood the rules and regulations. I obtained urine sample today. I provided a prescription for oxycodone 10 mg Q6 hours when necessary for 120 tablets with no refills.

## 2011-12-27 NOTE — Assessment & Plan Note (Signed)
Blood pressure at goal. Continue current regimen. 

## 2011-12-28 LAB — PRESCRIPTION ABUSE MONITORING 15P, URINE
Amphetamine/Meth: NEGATIVE ng/mL
Cannabinoid Scrn, Ur: NEGATIVE ng/mL
Creatinine, Urine: 202.83 mg/dL (ref >20.0)
Fentanyl, Ur: NEGATIVE ng/mL
Meperidine, Ur: NEGATIVE ng/mL
Oxycodone Screen, Ur: NEGATIVE ng/mL
Tramadol Scrn, Ur: NEGATIVE ng/mL
Zolpidem, Urine: NEGATIVE ng/mL

## 2011-12-29 ENCOUNTER — Other Ambulatory Visit: Payer: Self-pay | Admitting: Internal Medicine

## 2011-12-29 ENCOUNTER — Encounter: Payer: Self-pay | Admitting: Licensed Clinical Social Worker

## 2011-12-29 ENCOUNTER — Telehealth: Payer: Self-pay | Admitting: Licensed Clinical Social Worker

## 2011-12-29 DIAGNOSIS — E119 Type 2 diabetes mellitus without complications: Secondary | ICD-10-CM

## 2011-12-29 DIAGNOSIS — I5022 Chronic systolic (congestive) heart failure: Secondary | ICD-10-CM

## 2011-12-29 DIAGNOSIS — I10 Essential (primary) hypertension: Secondary | ICD-10-CM

## 2011-12-29 DIAGNOSIS — F329 Major depressive disorder, single episode, unspecified: Secondary | ICD-10-CM

## 2011-12-29 DIAGNOSIS — G8929 Other chronic pain: Secondary | ICD-10-CM

## 2011-12-29 DIAGNOSIS — F32A Depression, unspecified: Secondary | ICD-10-CM

## 2011-12-29 LAB — BENZODIAZEPINES (GC/LC/MS), URINE
Alprazolam (GC/LC/MS), ur confirm: NEGATIVE ng/mL
Alprazolam metabolite (GC/LC/MS), ur confirm: NEGATIVE ng/mL
Estazolam (GC/LC/MS), ur confirm: NEGATIVE ng/mL
Flunitrazepam metabolite (GC/LC/MS), ur confirm: NEGATIVE ng/mL
Flurazepam metabolite (GC/LC/MS), ur confirm: NEGATIVE ng/mL
Nordiazepam (GC/LC/MS), ur confirm: 141 ng/mL

## 2011-12-29 NOTE — Telephone Encounter (Signed)
[name] was referred to CSW for referral to Home Health.  CSW placed call to to [name] to discuss referral.  [name] states he and his dau work together to fill his pill box.  Dau reads the labels and [name] placed the number of pills in the box.  [name] states he has a low literacy level and has difficulty reading/understanding written communication.  [name] currently uses [facility] and does not want to change pharmacy.  [name] states he does not think he needs a home health nurse to assist with medication, as [name] feels he and daughter "have a good thing going".  CSW discussed referral to [facility] [employer] [facility], as they have RN that can assist with medication management as well as additional concerns.  [name] in agreement with referral to [facility].  CSW faxed referral to [facility].

## 2012-01-14 ENCOUNTER — Ambulatory Visit (HOSPITAL_COMMUNITY): Payer: Self-pay | Admitting: Psychiatry

## 2012-01-18 ENCOUNTER — Encounter: Payer: Self-pay | Admitting: *Deleted

## 2012-01-26 ENCOUNTER — Other Ambulatory Visit: Payer: Self-pay | Admitting: *Deleted

## 2012-01-26 DIAGNOSIS — G8929 Other chronic pain: Secondary | ICD-10-CM

## 2012-01-27 ENCOUNTER — Telehealth: Payer: Self-pay | Admitting: Internal Medicine

## 2012-01-27 ENCOUNTER — Telehealth: Payer: Self-pay | Admitting: *Deleted

## 2012-01-27 NOTE — Telephone Encounter (Signed)
Pt needs a call r/t pain meds

## 2012-01-27 NOTE — Telephone Encounter (Signed)
Patient called for refill of pain medication. Patient's last UDS showed positive for cocaine. I informed him about the results and he informed me that he did not take anything. I noted that it can not be positive for no reason and then he was quit. I again  Underlined the importance that he can not ingest illicit drugs, can not use any other pharmacy and he has to inform us if he is getting any medication from somewhere else. He was shown a video during the last visit.  I will have him schedule an appointment next week. He he needs to provide Urine for UDS before he can get refill on medication and the results need to be back before refills can be given.

## 2012-02-01 ENCOUNTER — Ambulatory Visit (INDEPENDENT_AMBULATORY_CARE_PROVIDER_SITE_OTHER): Payer: Medicare Other | Admitting: Internal Medicine

## 2012-02-01 DIAGNOSIS — G8929 Other chronic pain: Secondary | ICD-10-CM

## 2012-02-01 NOTE — Progress Notes (Signed)
Patient left half way through the visit, said he had to go pick up his kids and had transportation issues.

## 2012-02-02 LAB — PRESCRIPTION ABUSE MONITORING 15P, URINE
Amphetamine/Meth: NEGATIVE ng/mL
Cannabinoid Scrn, Ur: NEGATIVE ng/mL
Creatinine, Urine: 126.03 mg/dL (ref >20.0)
Methadone Screen, Urine: NEGATIVE ng/mL
Opiate Screen, Urine: NEGATIVE ng/mL
Oxycodone Screen, Ur: NEGATIVE ng/mL
Tramadol Scrn, Ur: NEGATIVE ng/mL
Zolpidem, Urine: NEGATIVE ng/mL

## 2012-02-03 LAB — BENZODIAZEPINES (GC/LC/MS), URINE
Alprazolam metabolite (GC/LC/MS), ur confirm: NEGATIVE ng/mL
Clonazepam metabolite (GC/LC/MS), ur confirm: NEGATIVE ng/mL
Diazepam (GC/LC/MS), ur confirm: NEGATIVE ng/mL
Flunitrazepam metabolite (GC/LC/MS), ur confirm: NEGATIVE ng/mL
Flurazepam metabolite (GC/LC/MS), ur confirm: NEGATIVE ng/mL
Lorazepam (GC/LC/MS), ur confirm: NEGATIVE ng/mL
Nordiazepam (GC/LC/MS), ur confirm: 51 ng/mL
Triazolam metabolite (GC/LC/MS), ur confirm: NEGATIVE ng/mL

## 2012-02-03 LAB — COCAINE METABOLITE (GC/LC/MS), URINE: Benzoylecgonine GC/MS Conf: 163 ng/mL

## 2012-02-04 ENCOUNTER — Telehealth: Payer: Self-pay | Admitting: *Deleted

## 2012-02-04 NOTE — Telephone Encounter (Signed)
Pt called and was upset about pain med and uds, he stated he was getting the "runaround", he was informed he could call back at 1600. Spoke w/ dr Collier Bullock, she will speak to him when he calls.

## 2012-02-23 ENCOUNTER — Other Ambulatory Visit (HOSPITAL_COMMUNITY): Payer: Self-pay | Admitting: *Deleted

## 2012-02-23 DIAGNOSIS — I10 Essential (primary) hypertension: Secondary | ICD-10-CM

## 2012-02-23 MED ORDER — POTASSIUM CHLORIDE ER 10 MEQ PO TBCR: 10.0000 meq | EXTENDED_RELEASE_TABLET | Freq: Every day | ORAL | Status: DC

## 2012-02-23 MED ORDER — OMEPRAZOLE 20 MG PO CPDR: 20.0000 mg | DELAYED_RELEASE_CAPSULE | Freq: Every day | ORAL | Status: DC

## 2012-03-22 ENCOUNTER — Inpatient Hospital Stay (HOSPITAL_COMMUNITY)
Admission: EM | Admit: 2012-03-22 | Discharge: 2012-03-27 | DRG: 291 | Disposition: A | Discharge status: Home / Self Care | Payer: Medicare Other | Attending: Internal Medicine | Admitting: Internal Medicine

## 2012-03-22 ENCOUNTER — Emergency Department (HOSPITAL_COMMUNITY): Payer: Medicare Other

## 2012-03-22 ENCOUNTER — Encounter (HOSPITAL_COMMUNITY): Payer: Self-pay | Admitting: Emergency Medicine

## 2012-03-22 DIAGNOSIS — E1129 Type 2 diabetes mellitus with other diabetic kidney complication: Secondary | ICD-10-CM | POA: Diagnosis present

## 2012-03-22 DIAGNOSIS — I129 Hypertensive chronic kidney disease with stage 1 through stage 4 chronic kidney disease, or unspecified chronic kidney disease: Secondary | ICD-10-CM | POA: Diagnosis present

## 2012-03-22 DIAGNOSIS — I10 Essential (primary) hypertension: Secondary | ICD-10-CM | POA: Diagnosis present

## 2012-03-22 DIAGNOSIS — Z95810 Presence of automatic (implantable) cardiac defibrillator: Secondary | ICD-10-CM

## 2012-03-22 DIAGNOSIS — Z87891 Personal history of nicotine dependence: Secondary | ICD-10-CM

## 2012-03-22 DIAGNOSIS — G8929 Other chronic pain: Secondary | ICD-10-CM | POA: Diagnosis present

## 2012-03-22 DIAGNOSIS — F1210 Cannabis abuse, uncomplicated: Secondary | ICD-10-CM | POA: Diagnosis present

## 2012-03-22 DIAGNOSIS — N183 Chronic kidney disease, stage 3 unspecified: Secondary | ICD-10-CM | POA: Diagnosis present

## 2012-03-22 DIAGNOSIS — K219 Gastro-esophageal reflux disease without esophagitis: Secondary | ICD-10-CM | POA: Diagnosis present

## 2012-03-22 DIAGNOSIS — F1410 Cocaine abuse, uncomplicated: Secondary | ICD-10-CM | POA: Diagnosis present

## 2012-03-22 DIAGNOSIS — F1010 Alcohol abuse, uncomplicated: Secondary | ICD-10-CM | POA: Diagnosis present

## 2012-03-22 DIAGNOSIS — I428 Other cardiomyopathies: Secondary | ICD-10-CM

## 2012-03-22 DIAGNOSIS — D72829 Elevated white blood cell count, unspecified: Secondary | ICD-10-CM | POA: Diagnosis present

## 2012-03-22 DIAGNOSIS — J189 Pneumonia, unspecified organism: Secondary | ICD-10-CM | POA: Diagnosis present

## 2012-03-22 DIAGNOSIS — F329 Major depressive disorder, single episode, unspecified: Secondary | ICD-10-CM | POA: Diagnosis present

## 2012-03-22 DIAGNOSIS — F32A Depression, unspecified: Secondary | ICD-10-CM | POA: Diagnosis present

## 2012-03-22 DIAGNOSIS — G4733 Obstructive sleep apnea (adult) (pediatric): Secondary | ICD-10-CM | POA: Diagnosis present

## 2012-03-22 DIAGNOSIS — I5023 Acute on chronic systolic (congestive) heart failure: Principal | ICD-10-CM | POA: Diagnosis present

## 2012-03-22 DIAGNOSIS — K279 Peptic ulcer, site unspecified, unspecified as acute or chronic, without hemorrhage or perforation: Secondary | ICD-10-CM | POA: Diagnosis present

## 2012-03-22 DIAGNOSIS — K254 Chronic or unspecified gastric ulcer with hemorrhage: Secondary | ICD-10-CM | POA: Diagnosis present

## 2012-03-22 DIAGNOSIS — G7000 Myasthenia gravis without (acute) exacerbation: Secondary | ICD-10-CM | POA: Diagnosis present

## 2012-03-22 DIAGNOSIS — R079 Chest pain, unspecified: Secondary | ICD-10-CM

## 2012-03-22 DIAGNOSIS — N189 Chronic kidney disease, unspecified: Secondary | ICD-10-CM

## 2012-03-22 DIAGNOSIS — E119 Type 2 diabetes mellitus without complications: Secondary | ICD-10-CM | POA: Diagnosis present

## 2012-03-22 DIAGNOSIS — I5022 Chronic systolic (congestive) heart failure: Secondary | ICD-10-CM | POA: Diagnosis present

## 2012-03-22 DIAGNOSIS — N179 Acute kidney failure, unspecified: Secondary | ICD-10-CM

## 2012-03-22 DIAGNOSIS — I509 Heart failure, unspecified: Secondary | ICD-10-CM | POA: Diagnosis present

## 2012-03-22 HISTORY — DX: Chronic kidney disease, stage 3 (moderate): N18.3

## 2012-03-22 HISTORY — DX: Chronic kidney disease, stage 3 unspecified: N18.30

## 2012-03-22 LAB — COMPREHENSIVE METABOLIC PANEL
AST: 18 U/L (ref 0–37)
Albumin: 3.1 g/dL — ABNORMAL LOW (ref 3.5–5.2)
BUN: 18 mg/dL (ref 6–23)
CO2: 21 mEq/L (ref 19–32)
Calcium: 8.8 mg/dL (ref 8.4–10.5)
Chloride: 102 mEq/L (ref 96–112)
Creatinine, Ser: 1.62 mg/dL — ABNORMAL HIGH (ref 0.50–1.35)
GFR calc non Af Amer: 50 mL/min — ABNORMAL LOW (ref >90)
Total Bilirubin: 1.9 mg/dL — ABNORMAL HIGH (ref 0.3–1.2)

## 2012-03-22 LAB — GLUCOSE, CAPILLARY
Glucose-Capillary: 164 mg/dL — ABNORMAL HIGH (ref 70–99)
Glucose-Capillary: 148 mg/dL — ABNORMAL HIGH (ref 70–99)

## 2012-03-22 LAB — DIGOXIN LEVEL: Digoxin Level: <0.3 ng/mL — ABNORMAL LOW (ref 0.8–2.0)

## 2012-03-22 LAB — RAPID URINE DRUG SCREEN, HOSP PERFORMED
Amphetamines: NOT DETECTED
Barbiturates: NOT DETECTED
Opiates: NOT DETECTED
Tetrahydrocannabinol: POSITIVE — AB

## 2012-03-22 LAB — CBC WITH DIFFERENTIAL/PLATELET
Basophils Absolute: 0 10*3/uL (ref 0.0–0.1)
Eosinophils Absolute: 0 10*3/uL (ref 0.0–0.7)
Lymphocytes Relative: 6 % — ABNORMAL LOW (ref 12–46)
Lymphs Abs: 0.8 10*3/uL (ref 0.7–4.0)
Neutrophils Relative %: 88 % — ABNORMAL HIGH (ref 43–77)
Platelets: 232 10*3/uL (ref 150–400)
RBC: 4.99 MIL/uL (ref 4.22–5.81)
RDW: 13.4 % (ref 11.5–15.5)
WBC: 13.5 10*3/uL — ABNORMAL HIGH (ref 4.0–10.5)

## 2012-03-22 LAB — URINALYSIS, ROUTINE W REFLEX MICROSCOPIC
Glucose, UA: NEGATIVE mg/dL
Ketones, ur: 15 mg/dL — AB
Protein, ur: >300 mg/dL — AB
Urobilinogen, UA: 4 mg/dL — ABNORMAL HIGH (ref 0.0–1.0)

## 2012-03-22 LAB — PRO B NATRIURETIC PEPTIDE: Pro B Natriuretic peptide (BNP): 4469 pg/mL — ABNORMAL HIGH (ref 0–125)

## 2012-03-22 LAB — URINE MICROSCOPIC-ADD ON

## 2012-03-22 LAB — TROPONIN I: Troponin I: <0.3 ng/mL (ref <0.30)

## 2012-03-22 LAB — POCT I-STAT TROPONIN I: Troponin i, poc: 0.05 ng/mL (ref 0.00–0.08)

## 2012-03-22 MED ORDER — CARVEDILOL 6.25 MG PO TABS
6.2500 mg | ORAL_TABLET | Freq: Two times a day (BID) | ORAL | Status: DC
Start: 2012-03-22 — End: 2012-03-23
  Administered 2012-03-22: 6.25 mg via ORAL
  Filled 2012-03-22 (×4): qty 1

## 2012-03-22 MED ORDER — ACETAMINOPHEN 650 MG RE SUPP: 650.0000 mg | Freq: Four times a day (QID) | RECTAL | Status: DC | PRN

## 2012-03-22 MED ORDER — ACETAMINOPHEN 325 MG PO TABS: 650.0000 mg | ORAL_TABLET | Freq: Four times a day (QID) | ORAL | Status: DC | PRN

## 2012-03-22 MED ORDER — DIGOXIN 125 MCG PO TABS
0.1250 mg | ORAL_TABLET | Freq: Every day | ORAL | Status: DC
  Administered 2012-03-22 – 2012-03-27 (×6): 0.125 mg via ORAL
  Filled 2012-03-22 (×7): qty 1

## 2012-03-22 MED ORDER — DEXTROSE 5 % IV SOLN
1.0000 g | INTRAVENOUS | Status: DC
  Administered 2012-03-22 – 2012-03-24 (×3): 1 g via INTRAVENOUS
  Filled 2012-03-22 (×5): qty 10

## 2012-03-22 MED ORDER — OXYCODONE HCL 5 MG PO TABS
5.0000 mg | ORAL_TABLET | Freq: Three times a day (TID) | ORAL | Status: DC | PRN
  Administered 2012-03-22 – 2012-03-27 (×8): 5 mg via ORAL
  Filled 2012-03-22 (×8): qty 1

## 2012-03-22 MED ORDER — FUROSEMIDE 10 MG/ML IJ SOLN
80.0000 mg | Freq: Two times a day (BID) | INTRAMUSCULAR | Status: DC
  Administered 2012-03-22 – 2012-03-23 (×2): 80 mg via INTRAVENOUS
  Filled 2012-03-22 (×5): qty 8

## 2012-03-22 MED ORDER — NESIRITIDE 1.5 MG IV SOLR
0.0050 ug/kg/min | INTRAVENOUS | Status: DC
  Administered 2012-03-22: 0.005 ug/kg/min via INTRAVENOUS
  Filled 2012-03-22 (×2): qty 5

## 2012-03-22 MED ORDER — ISOSORBIDE MONONITRATE ER 60 MG PO TB24
60.0000 mg | ORAL_TABLET | Freq: Every day | ORAL | Status: DC
  Administered 2012-03-22 – 2012-03-27 (×6): 60 mg via ORAL
  Filled 2012-03-22 (×7): qty 1

## 2012-03-22 MED ORDER — POTASSIUM CHLORIDE CRYS ER 10 MEQ PO TBCR
10.0000 meq | EXTENDED_RELEASE_TABLET | Freq: Every day | ORAL | Status: DC
  Administered 2012-03-23: 10 meq via ORAL
  Filled 2012-03-22 (×2): qty 1

## 2012-03-22 MED ORDER — MIRTAZAPINE 30 MG PO TBDP
30.0000 mg | ORAL_TABLET | Freq: Every day | ORAL | Status: DC
  Administered 2012-03-22 – 2012-03-26 (×5): 30 mg via ORAL
  Filled 2012-03-22 (×6): qty 1

## 2012-03-22 MED ORDER — ATORVASTATIN CALCIUM 10 MG PO TABS
10.0000 mg | ORAL_TABLET | Freq: Every day | ORAL | Status: DC
  Administered 2012-03-22 – 2012-03-26 (×5): 10 mg via ORAL
  Filled 2012-03-22 (×6): qty 1

## 2012-03-22 MED ORDER — ONDANSETRON HCL 4 MG/2ML IJ SOLN: 4.0000 mg | Freq: Four times a day (QID) | INTRAMUSCULAR | Status: DC | PRN

## 2012-03-22 MED ORDER — SODIUM CHLORIDE 0.9 % IJ SOLN
3.0000 mL | Freq: Two times a day (BID) | INTRAMUSCULAR | Status: DC
  Administered 2012-03-22 – 2012-03-26 (×3): 3 mL via INTRAVENOUS

## 2012-03-22 MED ORDER — CEFTRIAXONE SODIUM 250 MG IJ SOLR
250.0000 mg | Freq: Once | INTRAMUSCULAR | Status: AC
  Administered 2012-03-22: 250 mg via INTRAMUSCULAR
  Filled 2012-03-22: qty 250

## 2012-03-22 MED ORDER — ONDANSETRON HCL 4 MG PO TABS: 4.0000 mg | ORAL_TABLET | Freq: Four times a day (QID) | ORAL | Status: DC | PRN

## 2012-03-22 MED ORDER — SODIUM CHLORIDE 0.9 % IV SOLN
250.0000 mL | INTRAVENOUS | Status: DC | PRN
  Administered 2012-03-23 – 2012-03-26 (×2): 250 mL via INTRAVENOUS

## 2012-03-22 MED ORDER — SODIUM CHLORIDE 0.9 % IJ SOLN: 3.0000 mL | INTRAMUSCULAR | Status: DC | PRN

## 2012-03-22 MED ORDER — AZITHROMYCIN 250 MG PO TABS
1000.0000 mg | ORAL_TABLET | Freq: Once | ORAL | Status: AC
  Administered 2012-03-22: 1000 mg via ORAL
  Filled 2012-03-22: qty 4

## 2012-03-22 MED ORDER — HEPARIN SODIUM (PORCINE) 5000 UNIT/ML IJ SOLN
5000.0000 [IU] | Freq: Three times a day (TID) | INTRAMUSCULAR | Status: DC
  Administered 2012-03-22 – 2012-03-27 (×14): 5000 [IU] via SUBCUTANEOUS
  Filled 2012-03-22 (×19): qty 1

## 2012-03-22 MED ORDER — PANTOPRAZOLE SODIUM 40 MG PO TBEC
40.0000 mg | DELAYED_RELEASE_TABLET | Freq: Every day | ORAL | Status: DC
  Administered 2012-03-22 – 2012-03-27 (×6): 40 mg via ORAL
  Filled 2012-03-22 (×6): qty 1

## 2012-03-22 MED ORDER — DIAZEPAM 5 MG PO TABS
5.0000 mg | ORAL_TABLET | Freq: Three times a day (TID) | ORAL | Status: DC | PRN
  Administered 2012-03-23 – 2012-03-26 (×2): 5 mg via ORAL
  Filled 2012-03-22 (×2): qty 1

## 2012-03-22 MED ORDER — SODIUM CHLORIDE 0.9 % IJ SOLN
3.0000 mL | Freq: Two times a day (BID) | INTRAMUSCULAR | Status: DC
  Administered 2012-03-22: 3 mL via INTRAVENOUS

## 2012-03-22 MED ORDER — AZITHROMYCIN 500 MG PO TABS
500.0000 mg | ORAL_TABLET | ORAL | Status: DC
  Administered 2012-03-22 – 2012-03-26 (×5): 500 mg via ORAL
  Filled 2012-03-22 (×7): qty 1

## 2012-03-22 MED ORDER — INSULIN ASPART 100 UNIT/ML ~~LOC~~ SOLN
0.0000 [IU] | SUBCUTANEOUS | Status: DC
  Administered 2012-03-22: 2 [IU] via SUBCUTANEOUS
  Administered 2012-03-22 – 2012-03-23 (×2): 1 [IU] via SUBCUTANEOUS
  Administered 2012-03-23: 2 [IU] via SUBCUTANEOUS
  Administered 2012-03-23: 3 [IU] via SUBCUTANEOUS
  Administered 2012-03-24: 14:00:00 via SUBCUTANEOUS
  Administered 2012-03-24 (×2): 2 [IU] via SUBCUTANEOUS
  Administered 2012-03-24: 1 [IU] via SUBCUTANEOUS
  Administered 2012-03-24: 2 [IU] via SUBCUTANEOUS
  Administered 2012-03-25: 1 [IU] via SUBCUTANEOUS
  Administered 2012-03-25: 2 [IU] via SUBCUTANEOUS
  Administered 2012-03-25: 3 [IU] via SUBCUTANEOUS
  Administered 2012-03-25: 1 [IU] via SUBCUTANEOUS

## 2012-03-22 NOTE — ED Notes (Addendum)
Onset 3 days ago chest pain and shortness of breath.  Pain currently 8/10 pressure. Bilateral lower extremity edema.  Patient states want to get checked for STD.

## 2012-03-22 NOTE — H&P (Signed)
  Addendum:  I agree with student's history, ROS, physical exam and assessment and plan. Please also see my H&P note separately.  Lorretta Harp, MD PGY2, Internal Medicine Teaching Service Pager: [phone]

## 2012-03-22 NOTE — ED Notes (Signed)
Helped Patient to standing scale. One person assist.
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
## 2012-03-22 NOTE — Progress Notes (Signed)
Nurse contacted MD to inform of pt current status since arriving on unit.  Pt complaining of 7/10 pain (headache), fever of 100.4, SOB with slight accessory muscle movement, RR 17-30's, and social work consult request.  MD acknowledge nurse concerns and ordered PRN pain medication.  Nurse will administer as ordered

## 2012-03-22 NOTE — ED Provider Notes (Signed)
History     CSN: [id number]  Arrival date & time 03/22/12  1478   First MD Initiated Contact with Patient 03/22/12 0957      Chief Complaint  Patient presents with  . Chest Pain  . Shortness of Breath    (Consider location/radiation/quality/duration/timing/severity/associated sxs/prior treatment) Patient is a 47 y.o. male presenting with chest pain and shortness of breath. The history is provided by the patient.  Chest Pain Associated symptoms: shortness of breath   Associated symptoms: no abdominal pain, no back pain, no fever, no headache and no palpitations   Shortness of Breath Associated symptoms: chest pain   Associated symptoms: no abdominal pain, no fever, no headaches, no neck pain and no rash   pt c/o cp and sob in the past couple days. States these symptoms are constant. At rest. Mid chest, dull, constant, non radiating. No associated nv or diaphoresis. No specific exacerbating or alleviating factors. Denies increased leg edema. Uses cpap at night, no home o2 use during day. Non productive cough. No sore throat or other uri c/o. No fever or chills. 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 Denies currently drinking. No pleuritic cp, no leg pain or  Swelling. Denies hx dvt or pe. Denies fam hx premature cad. Denies cocaine use, although noted to have hx cocaine abuse from review prior records.    Past Medical History  Diagnosis Date  . Hypertension   . 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 History of peptic ulcer disease     EGD 09/2007 shows prox gastric ulcer with black eschar, treated with PPI  . Depression   . History of alcohol abuse     quit approx 2011-2012  . Angina   . Shortness of breath   . OSA (obstructive sleep apnea)     sleeps with oxygen  . Cardiomyopathy   . ICD (implantable cardiac defibrillator) in place   . Diabetes mellitus      insulin dependent  . GERD (gastroesophageal reflux disease)   . Headache   . Myasthenia gravis 10/12/2011    Diagnosed in 09/2011. Received Plasmapheresis. CT chest no Thymoma. Acetylcholine receptor antibody negative.      Past Surgical History  Procedure Laterality Date  . Cardiac defibrillator placement  04/2007  . Knee surgery    . Skin grafts    . Facial reconstruction surgery    . Insertion of dialysis catheter  10/12/2011    temporary     Family History  Problem Relation Age of Onset  . Heart failure Mother   . Hypertension Mother   . Sarcoidosis Mother   . Diabetes type II Mother   . Heart attack Brother     History  Substance Use Topics  . Smoking status: Former Smoker    Types: Cigarettes, Cigars    Quit date: 07/27/2007  . Smokeless tobacco: Never Used  . Alcohol Use: No     Comment: no alcohol since 2012      Review of Systems  Constitutional: Negative for fever and chills.  HENT: Negative for neck pain.   Eyes: Negative for pain, discharge and redness.  Respiratory: Positive for shortness of breath.   Cardiovascular: Positive for chest pain. Negative for palpitations.  Gastrointestinal: Negative for abdominal pain.  Genitourinary: Positive for dysuria and discharge. Negative for flank pain.  Musculoskeletal: Negative for  back pain.  Skin: Negative for rash.  Neurological: Negative for headaches.  Hematological: Does not bruise/bleed easily.  Psychiatric/Behavioral: Negative for confusion.    Allergies  Shellfish allergy; Adhesive; and Zoloft  Home Medications   Current Outpatient Rx  Name  Route  Sig  Dispense  Refill  . carvedilol (COREG) 6.25 MG tablet   Oral   Take 6.25 mg by mouth 2 (two) times daily with a meal.         . diazepam (VALIUM) 5 MG tablet   Oral   Take 5 mg by mouth 3 (three) times daily as needed for anxiety.         . digoxin (LANOXIN) 0.125 MG tablet   Oral   Take 0.125 mg by mouth daily.         .  hydrochlorothiazide (HYDRODIURIL) 25 MG tablet   Oral   Take 37.5 mg by mouth 3 (three) times daily.         . isosorbide mononitrate (IMDUR) 60 MG 24 hr tablet   Oral   Take 60 mg by mouth daily.         . mirtazapine (REMERON SOL-TAB) 30 MG disintegrating tablet   Oral   Take 30 mg by mouth at bedtime.         Marland Kitchen omeprazole (PRILOSEC) 20 MG capsule   Oral   Take 20 mg by mouth daily.         . potassium chloride (K-DUR) 10 MEQ tablet   Oral   Take 10 mEq by mouth daily.         . rosuvastatin (CRESTOR) 10 MG tablet   Oral   Take 10 mg by mouth daily.         Marland Kitchen torsemide (DEMADEX) 20 MG tablet   Oral   Take 60 mg by mouth daily.           BP 141/92  Pulse 102  Temp(Src) 100.4 F (38 C) (Oral)  Resp 20  Ht 6' (1.829 m)  Wt 219 lb (99.338 kg)  BMI 29.7 kg/m2  SpO2 97%  Physical Exam  Nursing note and vitals reviewed. Constitutional: He is oriented to person, place, and time. He appears well-developed and well-nourished. No distress.  HENT:  Head: Atraumatic.  Nose: Nose normal.  Mouth/Throat: Oropharynx is clear and moist.  Eyes: Conjunctivae are normal.  Neck: Normal range of motion. Neck supple. No tracheal deviation present.  No stiffness or rigidity  Cardiovascular: Normal rate, regular rhythm, normal heart sounds and intact distal pulses.   Pulmonary/Chest: Effort normal. No accessory muscle usage. No respiratory distress.  Basilar rales  Abdominal: Soft. Bowel sounds are normal. He exhibits no distension and no mass. There is no tenderness. There is no rebound and no guarding.  Genitourinary:  Normal ext gen, no d/c. No cva tenderness  Musculoskeletal: Normal range of motion. He exhibits no tenderness.  Mild bilateral leg edema, symmetric.   Neurological: He is alert and oriented to person, place, and time.  Skin: Skin is warm and dry.  Psychiatric: He has a normal mood and affect.    ED Course  Procedures (including critical care  time)   Results for orders placed during the hospital encounter of 03/22/12  COMPREHENSIVE METABOLIC PANEL      Result Value Range   Sodium 137  135 - 145 mEq/L   Potassium 3.7  3.5 - 5.1 mEq/L   Chloride 102  96 - 112 mEq/L   CO2 21  19 - 32 mEq/L   Glucose, Bld 133 (*) 70 - 99 mg/dL   BUN 18  6 - 23 mg/dL   Creatinine, Ser 4.09 (*) 0.50 - 1.35 mg/dL   Calcium 8.8  8.4 - 81.1 mg/dL   Total Protein 6.9  6.0 - 8.3 g/dL   Albumin 3.1 (*) 3.5 - 5.2 g/dL   AST 18  0 - 37 U/L   ALT 15  0 - 53 U/L   Alkaline Phosphatase 71  39 - 117 U/L   Total Bilirubin 1.9 (*) 0.3 - 1.2 mg/dL   GFR calc non Af Amer 50 (*) >90 mL/min   GFR calc Af Amer 58 (*) >90 mL/min  CBC WITH DIFFERENTIAL      Result Value Range   WBC 13.5 (*) 4.0 - 10.5 K/uL   RBC 4.99  4.22 - 5.81 MIL/uL   Hemoglobin 14.7  13.0 - 17.0 g/dL   HCT 91.4  78.2 - 95.6 %   MCV 85.2  78.0 - 100.0 fL   MCH 29.5  26.0 - 34.0 pg   MCHC 34.6  30.0 - 36.0 g/dL   RDW 21.3  08.6 - 57.8 %   Platelets 232  150 - 400 K/uL   Neutrophils Relative 88 (*) 43 - 77 %   Neutro Abs 11.9 (*) 1.7 - 7.7 K/uL   Lymphocytes Relative 6 (*) 12 - 46 %   Lymphs Abs 0.8  0.7 - 4.0 K/uL   Monocytes Relative 6  3 - 12 %   Monocytes Absolute 0.8  0.1 - 1.0 K/uL   Eosinophils Relative 0  0 - 5 %   Eosinophils Absolute 0.0  0.0 - 0.7 K/uL   Basophils Relative 0  0 - 1 %   Basophils Absolute 0.0  0.0 - 0.1 K/uL  PRO B NATRIURETIC PEPTIDE      Result Value Range   Pro B Natriuretic peptide (BNP) 4469.0 (*) 0 - 125 pg/mL  DIGOXIN LEVEL      Result Value Range   Digoxin Level <0.3 (*) 0.8 - 2.0 ng/mL  POCT I-STAT TROPONIN I      Result Value Range   Troponin i, poc 0.05  0.00 - 0.08 ng/mL   Comment 3            Dg Chest 2 View  03/22/2012  *RADIOLOGY REPORT*  Clinical Data: Chest pain, shortness of breath.  CHEST - 2 VIEW  Comparison: 10/05/2011  Findings: Cardiomegaly.  Bilateral airspace opacities are noted, most pronounced in the perihilar regions,  most compatible with moderate edema/CHF.  This is worsened since prior study.  Left AICD is unchanged.  No effusions.  No acute bony abnormality.  IMPRESSION: Moderate CHF.   Original Report Authenticated By: Charlett Nose, M.D.        MDM  Iv ns. Labs. Cxr. Ecg.  Pt requests rx for gc and chlamydia, states his significant other recently tested positive and he has had dysuria.  Attempted to do urethral swab - pt refuses. He requests tx but refuses to have swab done.   Reviewed nursing notes and prior charts for additional history.    Date: 03/22/2012  Rate: 106  Rhythm: sinus tachycardia  QRS Axis: right  Intervals: normal  ST/T Wave abnormalities: nonspecific T wave changes  Conduction Disutrbances:none  Narrative Interpretation:   Old EKG Reviewed: unchanged  bnp elev, although c/w prior and c/w hx severe nonischemic cm/chf.  Given dyspnea, tachypnea, tachycardia, increased chf on  cxr, will give iv lasix in ed and contact pcp service for admission.   From cp standpoint prior cath with no hemodyn significant cad, initial trop neg.   Discussed w tsb, they will see/admit.         Suzi Roots, MD 03/22/12 1130
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
## 2012-03-23 DIAGNOSIS — J189 Pneumonia, unspecified organism: Secondary | ICD-10-CM | POA: Diagnosis present

## 2012-03-23 DIAGNOSIS — I517 Cardiomegaly: Secondary | ICD-10-CM

## 2012-03-23 DIAGNOSIS — I509 Heart failure, unspecified: Secondary | ICD-10-CM

## 2012-03-23 LAB — URINE MICROSCOPIC-ADD ON

## 2012-03-23 LAB — GLUCOSE, CAPILLARY
Glucose-Capillary: 112 mg/dL — ABNORMAL HIGH (ref 70–99)
Glucose-Capillary: 149 mg/dL — ABNORMAL HIGH (ref 70–99)

## 2012-03-23 LAB — COMPREHENSIVE METABOLIC PANEL
ALT: 11 U/L (ref 0–53)
AST: 14 U/L (ref 0–37)
Albumin: 2.5 g/dL — ABNORMAL LOW (ref 3.5–5.2)
Calcium: 8.2 mg/dL — ABNORMAL LOW (ref 8.4–10.5)
Creatinine, Ser: 1.69 mg/dL — ABNORMAL HIGH (ref 0.50–1.35)
GFR calc non Af Amer: 48 mL/min — ABNORMAL LOW (ref >90)
Sodium: 136 mEq/L (ref 135–145)
Total Protein: 6.3 g/dL (ref 6.0–8.3)

## 2012-03-23 LAB — URINALYSIS, ROUTINE W REFLEX MICROSCOPIC
Nitrite: NEGATIVE
Specific Gravity, Urine: 1.008 (ref 1.005–1.030)
Urobilinogen, UA: 1 mg/dL (ref 0.0–1.0)
pH: 5.5 (ref 5.0–8.0)

## 2012-03-23 LAB — STREP PNEUMONIAE URINARY ANTIGEN: Strep Pneumo Urinary Antigen: NEGATIVE

## 2012-03-23 LAB — LEGIONELLA ANTIGEN, URINE

## 2012-03-23 LAB — TROPONIN I: Troponin I: <0.3 ng/mL (ref <0.30)

## 2012-03-23 MED ORDER — PERFLUTREN LIPID MICROSPHERE
1.0000 mL | INTRAVENOUS | Status: AC | PRN
  Administered 2012-03-23: 2 mL via INTRAVENOUS
  Filled 2012-03-23: qty 10

## 2012-03-23 MED ORDER — PERFLUTREN LIPID MICROSPHERE
INTRAVENOUS | Status: AC
  Filled 2012-03-23: qty 10

## 2012-03-23 MED ORDER — FUROSEMIDE 10 MG/ML IJ SOLN
80.0000 mg | Freq: Two times a day (BID) | INTRAMUSCULAR | Status: DC
  Administered 2012-03-23 – 2012-03-24 (×2): 80 mg via INTRAVENOUS
  Filled 2012-03-23 (×2): qty 8

## 2012-03-23 MED ORDER — METOLAZONE 2.5 MG PO TABS
2.5000 mg | ORAL_TABLET | Freq: Two times a day (BID) | ORAL | Status: DC
  Administered 2012-03-23 – 2012-03-24 (×3): 2.5 mg via ORAL
  Filled 2012-03-23 (×4): qty 1

## 2012-03-23 MED ORDER — MILRINONE IN DEXTROSE 20 MG/100ML IV SOLN
0.2500 ug/kg/min | INTRAVENOUS | Status: DC
  Administered 2012-03-23 – 2012-03-25 (×5): 0.25 ug/kg/min via INTRAVENOUS
  Filled 2012-03-23 (×7): qty 100

## 2012-03-23 MED ORDER — FUROSEMIDE 10 MG/ML IJ SOLN: 80.0000 mg | Freq: Three times a day (TID) | INTRAMUSCULAR | Status: DC

## 2012-03-23 NOTE — H&P (Signed)
Internal Medicine Attending Admission Note Date: 03/23/2012  Patient name: [name] Medical record number: [id number] Date of birth: [birth year] Age: 47 y.o. Gender: male  I saw and evaluated the patient. I reviewed the resident's note and I agree with the resident's findings and plan as documented in the resident's note.  Chief Complaint(s): Chest pain and shortness of breath  History - key components related to admission: Patient is a 47 year old man with past medical history of congestive heart failure with ejection fraction less than 15% most likely secondary to nonischemic cardiomyopathy from alcohol abuse, type 2 diabetes, chronic kidney disease and hypertension with history of this substance abuse in the past presents with chief complaints of chest pain and shortness of breath. Patient had been doing fairly well and has been out of the hospital for about one year now. States that he started having cough with brownish productive sputum since 3 days. Cough was associated with fever chills, chest pain and body aches. Patient also complains of increased shortness of breath since last one week which has been progressively worsening and has required him to use more than 3 pillows at night. Patient had a sick contact that is her wife who had similar symptoms of cough and cold.  Patient also complained of penile discharge since last 2 days. Chest pain is described as sharp, 6-7/10 at worse, worse with deep breathing, improved with nothing. Associated with shortness of breath, fever and chills as explained above.  Patient has been extremely stressed lately and said that the depression made him use the cocaine and marijuana.  15 point review of system was negative except what is noted above.   Physical Exam - key components related to admission:  Filed Vitals:   03/23/12 0802 03/23/12 0900 03/23/12 0945 03/23/12 1138  BP: 109/64 121/85  102/59  Pulse: 92 94 88 91  Temp: 98.2 F (36.8 C)    98.7 F (37.1 C)  TempSrc: Oral   Oral  Resp: 26 20  20   Height:      Weight:      SpO2: 96% 98%  100%   Physical Exam: General: Vital signs reviewed and noted. Well-developed, well-nourished, in no acute distress; alert, appropriate and cooperative throughout examination.  Neck: No deformities, masses, or tenderness noted.Supple, No carotid Bruits, no JVD.  Lungs:  Normal respiratory effort. Clear to auscultation BL without crackles or wheezes.  Heart: RRR. S1 and S2 normal, S3 heard, no murmurs or rubs  Abdomen:  BS normoactive. Soft, Nondistended, non-tender.  No masses or organomegaly.  Extremities: No pretibial edema noted  Neurologic: A&O X3, CN II - XII are grossly intact. Motor strength is 5/5 in the all 4 extremities, Sensations intact to light touch, Cerebellar signs negative.  Skin: No visible rashes, scars.    Lab results:   Basic Metabolic Panel:  Recent Labs  95/62/13 0950 03/23/12 0226  NA 137 136  K 3.7 3.7  CL 102 104  CO2 21 24  GLUCOSE 133* 117*  BUN 18 20  CREATININE 1.62* 1.69*  CALCIUM 8.8 8.2*  MG  --  1.8   Liver Function Tests:  Recent Labs  03/22/12 0950 03/23/12 0226  AST 18 14  ALT 15 11  ALKPHOS 71 69  BILITOT 1.9* 1.1  PROT 6.9 6.3  ALBUMIN 3.1* 2.5*   CBC:  Recent Labs  03/22/12 0950  WBC 13.5*  NEUTROABS 11.9*  HGB 14.7  HCT 42.5  MCV 85.2  PLT 232   Cardiac  Enzymes:  Recent Labs  03/22/12 1633 03/22/12 2126 03/23/12 0225  TROPONINI <0.30 <0.30 <0.30   BNP: No components found with this basename: POCBNP,  D-Dimer: No results found for this basename: DDIMER,  in the last 72 hours CBG:  Recent Labs  03/22/12 1626 03/22/12 2146 03/22/12 2359 03/23/12 0412 03/23/12 0805  GLUCAP 164* 148* 112* 119* 115*    Imaging results:  Dg Chest 2 View  03/22/2012  *RADIOLOGY REPORT*  Clinical Data: Chest pain, shortness of breath.  CHEST - 2 VIEW  Comparison: 10/05/2011  Findings: Cardiomegaly.  Bilateral airspace  opacities are noted, most pronounced in the perihilar regions, most compatible with moderate edema/CHF.  This is worsened since prior study.  Left AICD is unchanged.  No effusions.  No acute bony abnormality.  IMPRESSION: Moderate CHF.   Original Report Authenticated By: Charlett Nose, M.D.     Other results: EKG: Patient has T-wave inversions in lateral leads which are unchanged from his previous EKG. Otherwise normal EKG.  Assessment & Plan by Problem:  Principal Problem:   Acute on chronic systolic heart failure Active Problems:   Community acquired pneumonia   DM2 (diabetes mellitus, type 2)   Morbid obesity   HTN (hypertension)   Depression   Gastric ulcer with hemorrhage   Chronic renal failure   Myasthenia gravis  Patient is a 47 year old man with history of systolic congestive heart failure due to nonischemic cardiomyopathy related to alcohol abuse in the past, depression, chronic kidney disease, nonobstructive coronary artery disease and diabetes mellitus who comes in with acute shortness of breath most likely secondary to fluid overload and coexisting community-acquired pneumonia. Patient was admitted to step down unit and was started on high-dose IV diuretics. Heart failure team was consulted who saw the patient and recommended starting him on low-dose nesiritide. Patient's blood pressure has been on the lower side and also his diuresis has been suboptimal most likely secondary to extremely low cardiac output. Patient may need inotropic support to help with diuresis. We will continue to hold his other blood pressure medications at this time. We should hold Coreg as patient was found positive for cocaine.  We will treat his community-acquired pneumonia with IV ceftriaxone and azithromycin which should also treat his GC and Chlamydia. Rest of the medical management as per resident's note. I spent about 15 minutes in counseling the patient regarding the need to stop substance abuse.  Patient agrees. He denied suicidal or homicidal ideations at this time.  Lars Mage MD Faculty-Internal Medicine Residency Program Pager [phone]

## 2012-03-23 NOTE — Progress Notes (Addendum)
Advanced Heart Failure Rounding Note   Subjective:    Mr. Kincer is a 47 y/o male with HTN and DM2 with longstanding HF due to NICM (probably ETOH), EF 15%. Cath in 2008 showed left circumflex with 40% stenosis. He has chronic pain and was diagnosed with myasthenia gravis in September 2012 requiring plasmapheresis exchange and mestonin. He was followed by [facility] but was discharged from the program.   Admitted with PNA and fluid overload.  On abx per primary team and IV lasix 80 mg BID. +cocaine/THC.  Started on nesiritide for afterload reduction yesterday.   Urine output improving. Cr relatively stable.  Very tired this morning.  HR improved to the 80s.  No weight on chart yet.  Breathing better.  No CPAP ordered. Still febrile.   Urine remains very dark.  Objective:   Weight Range:  Vital Signs:   Temp:  [98.2 F (36.8 C)-101 F (38.3 C)] 98.2 F (36.8 C) (03/06 0802) Pulse Rate:  [8-114] 94 (03/06 0900) Resp:  [10-33] 20 (03/06 0900) BP: (97-141)/(6-92) 121/85 mmHg (03/06 0900) SpO2:  [92 %-100 %] 98 % (03/06 0900) Weight:  [99.338 kg (219 lb)-105 kg (231 lb 7.7 oz)] 100.7 kg (222 lb 0.1 oz) (03/05 2359)    Weight change: Filed Weights   03/22/12 1100 03/22/12 1505 03/22/12 2359  Weight: 100.472 kg (221 lb 8 oz) 105 kg (231 lb 7.7 oz) 100.7 kg (222 lb 0.1 oz)    Intake/Output:   Intake/Output Summary (Last 24 hours) at 03/23/12 0920 Last data filed at 03/23/12 0900  Gross per 24 hour  Intake  932.5 ml  Output   2801 ml  Net -1868.5 ml     Physical Exam: General: Ill appearing, tired but responds to questions, No resp difficulty  HEENT: normal  Neck: supple. JVP 9-10, +HJR. Carotids 2+ bilat; no bruits. No lymphadenopathy or thryomegaly appreciated.  Cor: PMI nondisplaced. +S3 Regular rate and rhythm. 2/6 MR. RV lift  Lungs: crackles at both bases.  Abdomen: soft, nontender, mild distention. No hepatosplenomegaly. No bruits or masses. Good bowel  sounds.  Extremities: no cyanosis, clubbing, rash, edema  Neuro: alert & orientedx3, cranial nerves grossly intact. moves all 4 extremities w/o difficulty. Affect pleasant   Telemetry: SR  Labs: Basic Metabolic Panel:  Recent Labs Lab 03/22/12 0950 03/23/12 0226  NA 137 136  K 3.7 3.7  CL 102 104  CO2 21 24  GLUCOSE 133* 117*  BUN 18 20  CREATININE 1.62* 1.69*  CALCIUM 8.8 8.2*  MG  --  1.8    Liver Function Tests:  Recent Labs Lab 03/22/12 0950 03/23/12 0226  AST 18 14  ALT 15 11  ALKPHOS 71 69  BILITOT 1.9* 1.1  PROT 6.9 6.3  ALBUMIN 3.1* 2.5*   No results found for this basename: LIPASE, AMYLASE,  in the last 168 hours No results found for this basename: AMMONIA,  in the last 168 hours  CBC:  Recent Labs Lab 03/22/12 0950  WBC 13.5*  NEUTROABS 11.9*  HGB 14.7  HCT 42.5  MCV 85.2  PLT 232    Cardiac Enzymes:  Recent Labs Lab 03/22/12 1633 03/22/12 2126 03/23/12 0225  TROPONINI <0.30 <0.30 <0.30    BNP: BNP (last 3 results)  Recent Labs  09/03/11 1502 10/05/11 1947 03/22/12 0950  PROBNP 3290.0* 5931.0* 4469.0*      Imaging: Dg Chest 2 View  03/22/2012  *RADIOLOGY REPORT*  Clinical Data: Chest pain, shortness of breath.  CHEST -  2 VIEW  Comparison: 10/05/2011  Findings: Cardiomegaly.  Bilateral airspace opacities are noted, most pronounced in the perihilar regions, most compatible with moderate edema/CHF.  This is worsened since prior study.  Left AICD is unchanged.  No effusions.  No acute bony abnormality.  IMPRESSION: Moderate CHF.   Original Report Authenticated By: Charlett Nose, M.D.      Medications:     Scheduled Medications: . atorvastatin  10 mg Oral q1800  . azithromycin  500 mg Oral Q24H  . cefTRIAXone (ROCEPHIN)  IV  1 g Intravenous Q24H  . digoxin  0.125 mg Oral Daily  . furosemide  80 mg Intravenous TID  . heparin  5,000 Units Subcutaneous Q8H  . insulin aspart  0-9 Units Subcutaneous Q4H  . isosorbide  mononitrate  60 mg Oral Daily  . mirtazapine  30 mg Oral QHS  . pantoprazole  40 mg Oral Daily  . potassium chloride  10 mEq Oral Daily  . sodium chloride  3 mL Intravenous Q12H  . sodium chloride  3 mL Intravenous Q12H    Infusions: . nesiritide (NATRECOR) infusion 0.005 mcg/kg/min (03/22/12 1810)    PRN Medications: sodium chloride, diazepam, ondansetron (ZOFRAN) IV, ondansetron, oxyCODONE, sodium chloride   Assessment:   1. Acute on chronic systolic heart failure  2. NICM, EF 15%  3. ?CAP febrile with fever/chills  4. Chronic renal failure  5. Nonobstructive CAD  6. Polysubstance abuse  7. DM type 2  Plan/Discussion:    My suspicion is that the major issue from a HF perspective is low output rather than severe volume overload. Also appears to have bronchitis. Would switch nesiritide to milrinone. Continue gentle diuresis, may need PICC to follow more closely. Hold off on ACE and b-blocker due to low output. Will start hydralazine/nitrates as he improves.   Agree with abx for febrile illness.  A major issue for him is his relationship with his wife, whenever she comes home he gets sick. He tells me she recently came home and gave him gonorrhea and chlamydia but now she is gone again.   Will re-enroll in our Paramedicine program.    Length of Stay: 1 Daniel Bensimhon,MD 9:29 AM

## 2012-03-23 NOTE — Progress Notes (Signed)
Utilization Review Completed. 03/23/2012  

## 2012-03-23 NOTE — Progress Notes (Signed)
Pt had 15 beat runs of V-tach. Milrinone started, PA Barret R made aware, pt asleep will continue to monitor

## 2012-03-23 NOTE — Progress Notes (Signed)
Subjective: No acute events overnight.  Patient complains that he is very tired and is having some body aches. He reports that he is breathing better and his symptoms are gradually improving.     Objective: Vital signs in last 24 hours: Filed Vitals:   03/23/12 0500 03/23/12 0600 03/23/12 0802 03/23/12 0900  BP: 107/68 103/66 109/64 121/85  Pulse: 85 81 92 94  Temp:   98.2 F (36.8 C)   TempSrc:   Oral   Resp: 28 22 26 20   Height:      Weight:      SpO2: 92% 94% 96% 98%   Weight change:   Intake/Output Summary (Last 24 hours) at 03/23/12 0938 Last data filed at 03/23/12 0900  Gross per 24 hour  Intake  932.5 ml  Output   2801 ml  Net -1868.5 ml    Physical Exam:   Filed Vitals:   03/23/12 0600 03/23/12 0802 03/23/12 0900 03/23/12 0945  BP: 103/66 109/64 121/85   Pulse: 81 92 94 88  Temp:  98.2 F (36.8 C)    TempSrc:  Oral    Resp: 22 26 20    Height:      Weight:      SpO2: 94% 96% 98%     General: Not in acute distress HEENT: PERRL, EOMI, no scleral icterus, Did not appreciate JVD or bruit. Cardiac: RRR with +S3.  Pulm: Crackles present at lung bases bilaterally. No wheezing, or rubs. Abd: Soft, nondistended, nontender, no rebound pain, no organomegaly, BS present Ext: No pitting edema. 2+DP/PT pulse bilaterally Musculoskeletal: No joint deformities, erythema, or stiffness, ROM full Skin: No rashes.  Neuro: Alert and oriented X3, cranial nerves II-XII grossly intact, muscle strength 5/5 in all extremeties, sensation to light touch intact. Brachial reflex 2+ bilaterally. Knee reflex 1+ bilaterally. Negative Babinski's sign. Normal finger to nose test. Psych: Patient is not psychotic, no suicidal or hemocidal ideation.   Lab Results: @labtest2 @ Micro Results: Recent Results (from the past 240 hour(s))  MRSA PCR SCREENING     Status: None   Collection Time    03/22/12  3:51 PM      Result Value Range Status   MRSA by PCR NEGATIVE  NEGATIVE Final   Comment:             The GeneXpert MRSA Assay (FDA     approved for NASAL specimens     only), is one component of a     comprehensive MRSA colonization     surveillance program. It is not     intended to diagnose MRSA     infection nor to guide or     monitor treatment for     MRSA infections.  CULTURE, BLOOD (ROUTINE X 2)     Status: None   Collection Time    03/22/12  4:16 PM      Result Value Range Status   Specimen Description BLOOD ARM LEFT   Final   Special Requests BOTTLES DRAWN AEROBIC AND ANAEROBIC 10CC   Final   Culture  Setup Time 03/22/2012 22:59   Final   Culture     Final   Value:        BLOOD CULTURE RECEIVED NO GROWTH TO DATE CULTURE WILL BE HELD FOR 5 DAYS BEFORE ISSUING A FINAL NEGATIVE REPORT   Report Status PENDING   Incomplete  CULTURE, BLOOD (ROUTINE X 2)     Status: None   Collection Time    03/22/12  4:26 PM      Result Value Range Status   Specimen Description BLOOD HAND LEFT   Final   Special Requests BOTTLES DRAWN AEROBIC AND ANAEROBIC 10CC   Final   Culture  Setup Time 03/22/2012 22:59   Final   Culture     Final   Value:        BLOOD CULTURE RECEIVED NO GROWTH TO DATE CULTURE WILL BE HELD FOR 5 DAYS BEFORE ISSUING A FINAL NEGATIVE REPORT   Report Status PENDING   Incomplete   Studies/Results: Dg Chest 2 View  03/22/2012  *RADIOLOGY REPORT*  Clinical Data: Chest pain, shortness of breath.  CHEST - 2 VIEW  Comparison: 10/05/2011  Findings: Cardiomegaly.  Bilateral airspace opacities are noted, most pronounced in the perihilar regions, most compatible with moderate edema/CHF.  This is worsened since prior study.  Left AICD is unchanged.  No effusions.  No acute bony abnormality.  IMPRESSION: Moderate CHF.   Original Report Authenticated By: Charlett Nose, M.D.    Medications: I have reviewed the patient's current medications. Scheduled Meds: . atorvastatin  10 mg Oral q1800  . azithromycin  500 mg Oral Q24H  . cefTRIAXone (ROCEPHIN)  IV  1 g Intravenous Q24H  .  digoxin  0.125 mg Oral Daily  . furosemide  80 mg Intravenous BID  . heparin  5,000 Units Subcutaneous Q8H  . insulin aspart  0-9 Units Subcutaneous Q4H  . isosorbide mononitrate  60 mg Oral Daily  . metolazone  2.5 mg Oral BID  . mirtazapine  30 mg Oral QHS  . pantoprazole  40 mg Oral Daily  . potassium chloride  10 mEq Oral Daily  . sodium chloride  3 mL Intravenous Q12H  . sodium chloride  3 mL Intravenous Q12H   Continuous Infusions: . milrinone     PRN Meds:.sodium chloride, diazepam, ondansetron (ZOFRAN) IV, ondansetron, oxyCODONE, sodium chloride Assessment/Plan:  [name] is a 47 year old African American male with a past medical history of non-ischemic cardiomyopathy, CHF, DM type II, CKD, and HTN who presents with low output congestive heart failure in the setting of possible pneumonia.    1. Low grade fever with elevated WBC:  Likely community acquired pneumonia vs. Bronchitis. -Azithromycin 500 mg po  QD -Ceftriaxone IV 1g QD  -2L [place],monitor SpO2 and adjust as needed.  -Blood cultures, sputum cultures sent for evaluation, no results at this time.   2.Acute on chronic systolic heart failure: History of nonischemic cardiomyopathy due to alcohol abuse, systolic heart failure, s/p ICD.  This is in the setting of possible bronchitis vs pneumonia and recent cocaine use. Cycled troponins x3 to assess for possible ischemic cause of CHF exacerbation (Normal at 0.05).   ProBNP = 4469, consistent with CHF exacerbation (Pt. Range: 5931.0 - 2537.0)  -Heart failure team consulted today and recommended gentle diuresis due to low output rather than severe volume overload.  Recommend switching nesiritide to milrinone IV 0.2.  Holding ACEi and BB due to low output.  Start hydralazine/nitrates as he improves. -2D echo w/ contrast to evaluate CHF -Furosemide IV 80mg  BID + -metolazone 2.5mg  po QD for CHF symptoms and edema. Replace potassium (K+ BID, follow renal fx.)  -Daily weights (dry  weight = 211-214). Today the patient has 11 pounds of fluid (Weight = 222)  -atorvastatin 10mg  QD  -Monitor on tele  -Isosorbide mononitrate 60mg  QD -Continue home CPAP at night -Continue home digoxin 0.125mg  po QD (digoxin level is subtherapeutic at <  0.3)  Manage in outpatient setting.  -Spironolactone held due to hyperkalemia in outpatient setting.  3. DM type II  -Currently well controlled. Recent Hga1c = 5.4.  -Sliding scale insulin for management during admission.   4. Hypertension  -Currently stable.   Add hydralazine and nitrates as patient improves.   5. Penile Discharge: Gonorrhea/chlamydia vs. Urinary tract infection  -Evaluate with urinalysis and gonorrhea and chlamydia swab  -Urinalysis 3/5: Negative for nitrites and leukocytes, otherwise unchanged from baseline.  -Treated empirically with azithromycin 1000mg  po once, ceftriaxone 250mg  IM once   6. CKD  -Creatining at 1.69 today (from 1.62 yesterday). Continue to monitor. Urinalysis unchanged from baseline.  -Monitor Is/Os   7. FEN/GI  - Diet:  Carb Modified  -Tox screen positive for tetrhydrocannabinol, cocaine, benzodiazepines  -Zofran prn for nausea  8. Peptic Ulcer Disease  -Continue home omeprazole 40mg  QD po   9.  Chronic pain management -Controlled with oxycodone 5q8 po.  Consider switching to scheduled benzodiazepines given substance abuse history.  10. Prophylaxis  -Subcutaneous heparin  @PROBHOSP @  LOS: 1 day   [name] 03/23/2012, 9:38 AM

## 2012-03-23 NOTE — Progress Notes (Signed)
Internal Medicine Teaching Service Attending Note Date: 03/23/2012  Patient name: [name]  Medical record number: [id number]  Date of birth: November 29, 1965    This patient has been seen and discussed with the house staff. Please see their note for complete details. I concur with their findings with the following additions/corrections:  Patient has been doing well since last one year. His wife came back 2 days back and patient has been very stressed since that time. He admits to using cocaine, marijuana last week. He denied any complaints overnight except general tiredness and some body aches.  Physical Exam: General: Vital signs reviewed and noted. Well-developed, well-nourished, in no acute distress; alert, appropriate and cooperative throughout examination.  Neck: No deformities, masses, or tenderness noted.Supple, No carotid Bruits, no JVD noted  Lungs:  Normal respiratory effort. Clear to auscultation BL without crackles or wheezes.  Heart: RRR. S1 and S2 normal, S3 heard,   Abdomen:  BS normoactive. Soft, Nondistended, non-tender.  No masses or organomegaly.  Extremities: No pretibial edema.  Neurologic: A&O X3, CN II - XII are grossly intact. Motor strength is 5/5 in the all 4 extremities, Sensations intact to light touch, Cerebellar signs negative.  Skin: No visible rashes, scars.   Assessment and plan: After discussion with Dr. Gala Romney, it seems that it would be appropriate to start the patient on milrinone drip. We'll monitor the urine output. Patient is about 10 pounds up from his baseline weight. Will continue antibiotics at this time. Rest of the medical management as per medical student's note.  Lars Mage 03/23/2012, 12:03 PM

## 2012-03-23 NOTE — Progress Notes (Signed)
Echocardiogram 2D Echocardiogram with Definity has been performed.  [name], Jonathan 03/23/2012, 12:19 PM

## 2012-03-24 DIAGNOSIS — N179 Acute kidney failure, unspecified: Secondary | ICD-10-CM

## 2012-03-24 LAB — BASIC METABOLIC PANEL
CO2: 27 mEq/L (ref 19–32)
Glucose, Bld: 128 mg/dL — ABNORMAL HIGH (ref 70–99)
Potassium: 3.5 mEq/L (ref 3.5–5.1)
Sodium: 137 mEq/L (ref 135–145)

## 2012-03-24 LAB — URINE CULTURE: Special Requests: NORMAL

## 2012-03-24 LAB — GLUCOSE, CAPILLARY
Glucose-Capillary: 113 mg/dL — ABNORMAL HIGH (ref 70–99)
Glucose-Capillary: 239 mg/dL — ABNORMAL HIGH (ref 70–99)

## 2012-03-24 MED ORDER — HYDRALAZINE HCL 25 MG PO TABS
12.5000 mg | ORAL_TABLET | Freq: Three times a day (TID) | ORAL | Status: DC
  Administered 2012-03-24 – 2012-03-27 (×9): 12.5 mg via ORAL
  Filled 2012-03-24 (×12): qty 0.5

## 2012-03-24 NOTE — Progress Notes (Signed)
Advanced Heart Failure Rounding Note   Subjective:    Mr. Luallen is a 47 y/o male with HTN and DM2 with longstanding HF due to NICM (probably ETOH), EF 15%. Cath in 2008 showed left circumflex with 40% stenosis. He has chronic pain and was diagnosed with myasthenia gravis in September 2012 requiring plasmapheresis exchange and mestonin. He was followed by [facility] but was discharged from the program.   Admitted with PNA and fluid overload.  On abx per primary team and IV lasix 80 mg BID. +cocaine/THC.  Started on nesiritide for afterload reduction yesterday.  Changed to milrinone yesterday due to low output state.   Cr up to 1.89 from 1.69. Net -3.9 L UOP.  Breathing improved.  +cough with yellow sputum production.  Feels better.  Echo 03/23/12: EF 15%.  RV mod dilated and mild-mod reduced systolic function.  Mildly dilated LA and RA.  PAPP 45 mmHg.     Objective:   Weight Range:  Vital Signs:   Temp:  [98 F (36.7 C)-99.8 F (37.7 C)] 98.5 F (36.9 C) (03/07 1155) Pulse Rate:  [89-104] 92 (03/07 1155) Resp:  [9-36] 24 (03/07 1155) BP: (103-142)/(57-86) 125/73 mmHg (03/07 1155) SpO2:  [86 %-100 %] 100 % (03/07 1155) Weight:  [98.6 kg (217 lb 6 oz)-100.2 kg (220 lb 14.4 oz)] 98.6 kg (217 lb 6 oz) (03/07 0600) Last BM Date: 03/24/12  Weight change: Filed Weights   03/22/12 2359 03/24/12 0035 03/24/12 0600  Weight: 100.7 kg (222 lb 0.1 oz) 100.2 kg (220 lb 14.4 oz) 98.6 kg (217 lb 6 oz)    Intake/Output:   Intake/Output Summary (Last 24 hours) at 03/24/12 1318 Last data filed at 03/24/12 1100  Gross per 24 hour  Intake 1948.8 ml  Output   5850 ml  Net -3901.2 ml     Physical Exam: General: Ill appearing, tired but responds to questions, No resp difficulty  HEENT: normal  Neck: supple. JVP flat, +mild HJR. Carotids 2+ bilat; no bruits. No lymphadenopathy or thryomegaly appreciated.  Cor: PMI laterally displaced. Regular rate and rhythm. 2/6 MR.  Lungs: clear  .  Abdomen: soft, nontender, mild distention. No hepatosplenomegaly. No bruits or masses. Good bowel sounds.  Extremities: no cyanosis, clubbing, rash, edema  Neuro: alert & orientedx3, cranial nerves grossly intact. moves all 4 extremities w/o difficulty. Affect pleasant   Telemetry: SR  Labs: Basic Metabolic Panel:  Recent Labs Lab 03/22/12 0950 03/23/12 0226 03/24/12 0817  NA 137 136 137  K 3.7 3.7 3.5  CL 102 104 97  CO2 21 24 27   GLUCOSE 133* 117* 128*  BUN 18 20 24*  CREATININE 1.62* 1.69* 1.89*  CALCIUM 8.8 8.2* 9.0  MG  --  1.8  --     Liver Function Tests:  Recent Labs Lab 03/22/12 0950 03/23/12 0226  AST 18 14  ALT 15 11  ALKPHOS 71 69  BILITOT 1.9* 1.1  PROT 6.9 6.3  ALBUMIN 3.1* 2.5*   No results found for this basename: LIPASE, AMYLASE,  in the last 168 hours No results found for this basename: AMMONIA,  in the last 168 hours  CBC:  Recent Labs Lab 03/22/12 0950  WBC 13.5*  NEUTROABS 11.9*  HGB 14.7  HCT 42.5  MCV 85.2  PLT 232    Cardiac Enzymes:  Recent Labs Lab 03/22/12 1633 03/22/12 2126 03/23/12 0225  TROPONINI <0.30 <0.30 <0.30    BNP: BNP (last 3 results)  Recent Labs  09/03/11 1502  10/05/11 1947 03/22/12 0950  PROBNP 3290.0* 5931.0* 4469.0*      Imaging: No results found.   Medications:     Scheduled Medications: . atorvastatin  10 mg Oral q1800  . azithromycin  500 mg Oral Q24H  . cefTRIAXone (ROCEPHIN)  IV  1 g Intravenous Q24H  . digoxin  0.125 mg Oral Daily  . heparin  5,000 Units Subcutaneous Q8H  . insulin aspart  0-9 Units Subcutaneous Q4H  . isosorbide mononitrate  60 mg Oral Daily  . mirtazapine  30 mg Oral QHS  . pantoprazole  40 mg Oral Daily  . sodium chloride  3 mL Intravenous Q12H  . sodium chloride  3 mL Intravenous Q12H    Infusions: . milrinone 0.25 mcg/kg/min (03/23/12 2231)    PRN Medications: sodium chloride, diazepam, ondansetron (ZOFRAN) IV, ondansetron, oxyCODONE,  sodium chloride   Assessment:   1. Acute on chronic biventricular heart failure  2. NICM, EF 15%  3. ?CAP febrile with fever/chills  4. Chronic renal failure  5. Nonobstructive CAD  6. Polysubstance abuse  7. DM type 2  Plan/Discussion:    Volume status improved with addition of IV lasix and milrinone.  Now looks euvolemic. Creatinine up some. Will stop IV lasix. Resume po in am. If renal function improving will begin milrinone wean in am. Ok to got to tele.   Hold off on ACE and b-blocker due to low output and renal failure. Start hydralazine/nitrates as BP tolerates.  Will re-enroll in our Paramedicine program.    Length of Stay: 2 Daniel Bensimhon,MD 1:20 PM

## 2012-03-24 NOTE — Progress Notes (Signed)
Internal Medicine Teaching Service Attending Note Date: 03/24/2012  Patient name: [name]  Medical record number: [id number]  Date of birth: [birth year]    This patient has been seen and discussed with the house staff. Please see their note for complete details. I concur with their findings with the following additions/corrections: Patient continues to do well. He feels much better today in terms of shortness of breath. Patient was able to sleep well last night. He is -3 L since admission and -2 L in last 24 hours. Heart failure team managing the medications at this time. Patient to be transferred to telemetry bed. Patient has bump in creatinine likely secondary to hypovolemia. Plan to discontinue IV Lasix and restart him on home dose Lasix. Plan to wean milrinone as per heart failure team. Continue ceftriaxone and azithromycin for treatment of community-acquired pneumonia. Patient seems depressed but denies that he needs help at this time.  Medical management as per resident's.  Lars Mage 03/24/2012, 9:46 PM

## 2012-03-24 NOTE — Progress Notes (Signed)
Pharmacist Heart Failure Core Measure Documentation  Assessment: [name] has an EF documented as 15% on 03/23/12 by ECHO.  Rationale: Heart failure patients with left ventricular systolic dysfunction (LVSD) and an EF < 40% should be prescribed an angiotensin converting enzyme inhibitor (ACEI) or angiotensin receptor blocker (ARB) at discharge unless a contraindication is documented in the medical record.  This patient is not currently on an ACEI or ARB for HF.  This note is being placed in the record in order to provide documentation that a contraindication to the use of these agents is present for this encounter.  ACE Inhibitor or Angiotensin Receptor Blocker is contraindicated (specify all that apply)  []   ACEI allergy AND ARB allergy []   Angioedema []   Moderate or severe aortic stenosis []   Hyperkalemia []   Hypotension []   Renal artery stenosis [x]   Worsening renal function, preexisting renal disease or dysfunction   [name] 03/24/2012 9:08 AM

## 2012-03-24 NOTE — Progress Notes (Signed)
Subjective: No acute events overnight.  Patient received diazepam 5mg  x1  and oxycodone 5mg  x1 due to complaint of a headache.  Patient has appropriate sp02 on room air and cpap at night.  Objective: Vital signs in last 24 hours: Filed Vitals:   03/24/12 0500 03/24/12 0600 03/24/12 0700 03/24/12 0750  BP: 122/70 107/57 121/78 127/79  Pulse: 98 94 91 94  Temp:    98.2 F (36.8 C)  TempSrc:    Oral  Resp: 24 25 24 9   Height:      Weight:  98.6 kg (217 lb 6 oz)    SpO2: 94% 97% 97% 99%   Weight change: 0.862 kg (1 lb 14.4 oz)  Intake/Output Summary (Last 24 hours) at 03/24/12 1610 Last data filed at 03/24/12 0700  Gross per 24 hour  Intake 1738.8 ml  Output   3700 ml  Net -1961.2 ml   Physical Exam:  General: Not in acute distress  HEENT: PERRL, EOMI, no scleral icterus, Did not appreciate JVD or bruit.  Cardiac: RRR with +S3.  Pulm: Crackles present at lung bases bilaterally improving since yesterday. No wheezing, or rubs.  Abd: Soft, nondistended, nontender, no rebound pain, no organomegaly, BS present  Ext: No pitting edema. 2+DP/PT pulse bilaterally  Musculoskeletal: No joint deformities, erythema, or stiffness, ROM full  Skin: No rashes.  Neuro: Alert and oriented X3, cranial nerves II-XII grossly intact, muscle strength 5/5 in all extremeties, sensation to light touch intact. Brachial reflex 2+ bilaterally. Knee reflex 1+ bilaterally. Negative Babinski's sign. Normal finger to nose test.  Psych: Patient is not psychotic, no suicidal or hemocidal ideation.  Lab Results: @labtest2 @ Micro Results: Urine Cx 03/22/12:  NGTD Blood Cx 03/22/12: NGTD GC probe and CT probe 03/22/12:  Negative  Studies/Results: Dg Chest 2 View  03/22/2012  *RADIOLOGY REPORT*  Clinical Data: Chest pain, shortness of breath.  CHEST - 2 VIEW  Comparison: 10/05/2011  Findings: Cardiomegaly.  Bilateral airspace opacities are noted, most pronounced in the perihilar regions, most compatible with moderate  edema/CHF.  This is worsened since prior study.  Left AICD is unchanged.  No effusions.  No acute bony abnormality.  IMPRESSION: Moderate CHF.   Original Report Authenticated By: Charlett Nose, M.D.    Medications: I have reviewed the patient's current medications. Scheduled Meds: . atorvastatin  10 mg Oral q1800  . azithromycin  500 mg Oral Q24H  . cefTRIAXone (ROCEPHIN)  IV  1 g Intravenous Q24H  . digoxin  0.125 mg Oral Daily  . furosemide  80 mg Intravenous BID  . heparin  5,000 Units Subcutaneous Q8H  . insulin aspart  0-9 Units Subcutaneous Q4H  . isosorbide mononitrate  60 mg Oral Daily  . metolazone  2.5 mg Oral BID  . mirtazapine  30 mg Oral QHS  . pantoprazole  40 mg Oral Daily  . potassium chloride  10 mEq Oral Daily  . sodium chloride  3 mL Intravenous Q12H  . sodium chloride  3 mL Intravenous Q12H   Continuous Infusions: . milrinone 0.25 mcg/kg/min (03/23/12 2231)   PRN Meds:.sodium chloride, diazepam, ondansetron (ZOFRAN) IV, ondansetron, oxyCODONE, sodium chloride  Assessment/Plan:Mr. Antony is a 47 year old African American male with a past medical history of non-ischemic cardiomyopathy, CHF, DM type II, CKD, and HTN who presents with low output congestive heart failure in the setting of possible pneumonia.   1. Bronchitis vs. CAP: Likely community acquired pneumonia vs. Bronchitis.  -Azithromycin 500 mg po QD (Start day 03/22/12).  Continue for seven days.   -Ceftriaxone IV 1g QD (Start day 03/22/12). D/c tomorrow (3/8). -monitor SpO2 and add [place] as needed -Blood cultures, sputum cultures NGTD  2.Acute on chronic systolic heart failure: History of nonischemic cardiomyopathy due to alcohol abuse, systolic heart failure, s/p ICD. This is in the setting of possible bronchitis vs pneumonia and recent cocaine use. Cycled troponins x3 to assess for possible ischemic cause of CHF exacerbation (Normal at 0.05). ProBNP = 4469, consistent with CHF exacerbation (Pt. Range: 5931.0 -  2537.0)  -Heart failure team consulted and recommended gentle diuresis due to low output rather than severe volume overload.Holding ACEi and BB due to low output. Start hydralazine/nitrates as he improves.  -2D echo w/ contrast demonstrates 15% ejection fraction  -Furosemide IV 80mg  BID + metolazone 2.5mg  po QD for CHF symptoms and edema. Replace potassium (K+ BID, follow renal fx.) -milrinone IV (0.2mg /ml) at 7.6 ml/hr as inotropic agent given  -Daily weights (dry weight = 211-214). Today the patient weighs 217 lbs. -Daily BMP and Mg check -atorvastatin 10mg  QD  -Isosorbide mononitrate 60mg  QD  -Continue home CPAP at night  -Continue home digoxin 0.125mg  po QD (digoxin level is subtherapeutic at < 0.3) Manage in outpatient setting.  -Spironolactone held due to hyperkalemia in outpatient setting.  -Monitor on tele   3. DM type II  -Currently well controlled. Recent Hga1c = 5.4.  -Sliding scale insulin for management during admission.   4. Hypertension  -Currently stable. Add hydralazine and nitrates as patient improves.   5. Penile Discharge: Gonorrhea/chlamydia vs. Urinary tract infection  -Evaluate with urinalysis and gonorrhea and chlamydia swab:  Results are negative.  -Urinalysis 3/5: Negative for nitrites and leukocytes, otherwise unchanged from baseline.  -Treated empirically with azithromycin 1000mg  po once, ceftriaxone 250mg  IM once   6. CKD  -Creatining at 1.69 today (from 1.62 yesterday). Continue to monitor. Urinalysis unchanged from baseline.  -Monitor Is/Os   7. FEN/GI  - Diet: Carb Modified  -Tox screen positive for tetrhydrocannabinol, cocaine, benzodiazepines  -Zofran prn for nausea   8. Peptic Ulcer Disease  -Continue home omeprazole 40mg  QD po   9. Chronic pain management  -Controlled with oxycodone 5q8 po prn and diazepam  5mg  TID po prn. Consider switching to scheduled benzodiazepines given substance abuse history.   10. Prophylaxis  -Subcutaneous  heparin   @PROBHOSP @  LOS: 2 days   Lysbeth Penner 03/24/2012, 9:17 AM         Resident Addendum to Medical Student Note   I have seen and examined the patient, and agree with the the medical student assessment and plan outlined above. Please see my brief note below for additional details.  OBJECTIVE: VS: Reviewed  Meds: Reviewed  Labs: Reviewed  Imaging: Reviewed   General: Not in acute distress  HEENT: PERRL, EOMI, no scleral icterus, Did not appreciate JVD or bruit.  Cardiac: RRR with +S3.  Pulm: Crackles present at lung bases bilaterally which improved since yesterday. No wheezing, or rubs.  Abd: Soft, nondistended, nontender, no rebound pain, no organomegaly, BS present  Ext: trace pitting leg edema. 2+DP/PT pulse bilaterally  Musculoskeletal: No joint deformities, erythema, or stiffness, ROM full  Skin: No rashes.  Neuro: Alert and oriented X3, cranial nerves II-XII grossly intact, muscle strength 5/5 in all extremeties, sensation to light touch intact. Brachial reflex 2+ bilaterally. Knee reflex 1+ bilaterally. Negative Babinski's sign. Normal finger to nose test.  Psych: Patient is not psychotic, no suicidal or hemocidal ideation.  ASSESSMENT/ PLAN:  I agree with student's findings and assessment and plan with additional points as follows:  1. Bronchitis vs. CAP: Likely community acquired pneumonia vs. Bronchitis. Patient did not spike fever. Blood culture negative so far. Negative urine antigen for streptococcal and Legionella. Lung auscultation showed improved bilateral crackles since yesterday. Patient still has productive cough which has also improved. -will continue Azithromycin 500 mg po QD (Start day 03/22/12).  Continue for seven days.   -Ceftriaxone IV 1g QD (Start day 03/22/12). D/c tomorrow (3/8). -monitor SpO2 and add [place] as needed -pending final Blood cultures and sputum cultures  2.Acute on chronic systolic heart failure: History of  nonischemic cardiomyopathy due to alcohol abuse, systolic heart failure, s/p ICD. This is in the setting of possible bronchitis vs pneumonia and recent cocaine use.  ProBNP = 4469, consistent with CHF exacerbation on admission. Troponin negative x3. Repeated a 2-D echo showed EF remains 50%. patient had 5000 ml urine with Cre up slightly 1.69-->1.89. K=3.7. BW 219-->217 LBs (dry weight = 211-214). Patient was on Furosemide IV 80mg  BID + metolazone 2.5mg  po QD yesterday. Milrinone started yesterday. Patient's major issue is weak pump rather than fluid overload. Patient is only mildly fluid overloaded.   - Appreciate Heart failure team consult's in managing our patient. Will follow up recommendation.  - May d/c Metolazone given elevated of creatinine.  - Continue Milrinone per Heart Failure team - Continue to holding ACEi and BB due to low output and Cre up. - Isosorbide mononitrate 60mg  QD  - Continue home CPAP at night  - Start hydralazine/nitrates as he improves.  - Continue home digoxin 0.125mg  po QD (digoxin level was subtherapeutic at < 0.3 on admission. patient may not have taken it at home). - Spironolactone held due to hyperkalemia in outpatient setting.   3. DM type II  -Currently well controlled. Recent Hga1c = 5.4.  -Sliding scale insulin for management during admission.   4. Hypertension  -Currently stable. Add hydralazine as patient improves.  5. Penile Discharge: Negative gonorrhea and chlamydia probs. Urinalysis 3/5: Negative for nitrites and leukocytes. His symptoms improved.  --patient is on azithromycin on ceftriaxone due to bronchitis or CAP.  6. CKD  -Creatining at 1.69-->1.89 today - follow up BMP. May d/c Metolazone.   7. FEN/GI  - Diet: Carb Modified  -Tox screen positive for tetrhydrocannabinol, cocaine, benzodiazepines  -Zofran prn for nausea   8. Peptic Ulcer Disease  -Continue home omeprazole 40mg  QD po   9. Prophylaxis  -Subcutaneous  heparin    Length of Stay: 2   Lorretta Harp, MD PGY1, Internal Medicine Teaching Service Pager: [phone]   03/24/2012, 12:05 PM

## 2012-03-25 ENCOUNTER — Encounter (HOSPITAL_COMMUNITY): Payer: Self-pay | Admitting: *Deleted

## 2012-03-25 LAB — GLUCOSE, CAPILLARY
Glucose-Capillary: 129 mg/dL — ABNORMAL HIGH (ref 70–99)
Glucose-Capillary: 178 mg/dL — ABNORMAL HIGH (ref 70–99)

## 2012-03-25 LAB — BASIC METABOLIC PANEL
GFR calc Af Amer: 54 mL/min — ABNORMAL LOW (ref >90)
GFR calc non Af Amer: 47 mL/min — ABNORMAL LOW (ref >90)
Potassium: 3.4 mEq/L — ABNORMAL LOW (ref 3.5–5.1)
Sodium: 135 mEq/L (ref 135–145)

## 2012-03-25 MED ORDER — INSULIN ASPART 100 UNIT/ML ~~LOC~~ SOLN
0.0000 [IU] | Freq: Three times a day (TID) | SUBCUTANEOUS | Status: DC
  Administered 2012-03-26: 3 [IU] via SUBCUTANEOUS
  Administered 2012-03-26 (×2): 2 [IU] via SUBCUTANEOUS
  Administered 2012-03-27: 1 [IU] via SUBCUTANEOUS
  Administered 2012-03-27: 2 [IU] via SUBCUTANEOUS

## 2012-03-25 NOTE — Plan of Care (Signed)
Problem: Phase I Progression Outcomes Goal: EF % per last Echo/documented,Core Reminder form on chart Outcome: Completed/Met Date Met:  03/25/12 15%

## 2012-03-25 NOTE — Progress Notes (Signed)
Internal Medicine Teaching Service Attending Note Date: 03/25/2012  Patient name: [name]  Medical record number: [id number]  Date of birth: [birth year]    This patient has been seen and discussed with the house staff. Please see their note for complete details. I concur with their findings with the following additions/corrections: Patient is doing well. His creatinine has improved today as compared to yesterday. Patient has lost about 2.5 lts in last 24 hours and a total of -6 L since admission. Plan to wean him off milrinone and a possible discharge tomorrow if it is agreeable with a heart failure team. Continue management for community-acquired pneumonia. The medical management as per resident's note.   Lars Mage 03/25/2012, 11:37 AM

## 2012-03-25 NOTE — Progress Notes (Signed)
Per Night Nurse Pt refused neuro checks during night. Stated he wanted to be left alone to sleep.

## 2012-03-25 NOTE — Progress Notes (Signed)
Subjective: Patient reports feeling much better. No cough or chest pain. Still has yellowish sputum production. OSB improved significantly. He reports that current card modified diet (medium) is not enough and he feels hungry consistently.  Objective: Vital signs in last 24 hours: Filed Vitals:   03/25/12 0126 03/25/12 0134 03/25/12 0737 03/25/12 1013  BP:  119/70 124/71 118/73  Pulse: 92 83 84 95  Temp:  98 F (36.7 C) 97.5 F (36.4 C)   TempSrc:  Oral Oral   Resp: 18 16 18 18   Height:      Weight:   208 lb 8.9 oz (94.6 kg)   SpO2:  98% 99% 96%   Weight change: -10 lb 2.3 oz (-4.6 kg)  Intake/Output Summary (Last 24 hours) at 03/25/12 1125 Last data filed at 03/25/12 1000  Gross per 24 hour  Intake 1562.13 ml  Output   2400 ml  Net -837.87 ml   General: Not in acute distress  HEENT: PERRL, EOMI, no scleral icterus, Did not appreciate JVD. No bruit.  Cardiac: RRR with +S3.   Pulm: CTAB ,No rales, wheezing, or rubs.   Abd: Soft, nondistended, nontender, no rebound pain, no organomegaly, BS present  Ext: No leg edema.  2+DP/PT pulse bilaterally   Musculoskeletal: No joint deformities, erythema, or stiffness, ROM full  Skin: No rashes.   Neuro: Alert and oriented X3, cranial nerves II-XII grossly intact, muscle strength 5/5 in all extremeties, sensation to light touch intact.  Psych: Patient is not psychotic, no suicidal or hemocidal ideation.  Lab Results: Basic Metabolic Panel:  Recent Labs Lab 03/23/12 0226 03/24/12 0817 03/24/12 2321 03/25/12 0525  NA 136 137  --  135  K 3.7 3.5  --  3.4*  CL 104 97  --  96  CO2 24 27  --  29  GLUCOSE 117* 128*  --  135*  BUN 20 24*  --  25*  CREATININE 1.69* 1.89*  --  1.71*  CALCIUM 8.2* 9.0  --  9.2  MG 1.8  --  2.1  --    Liver Function Tests:  Recent Labs Lab 03/22/12 0950 03/23/12 0226  AST 18 14  ALT 15 11  ALKPHOS 71 69  BILITOT 1.9* 1.1  PROT 6.9 6.3  ALBUMIN 3.1* 2.5*   No results found for this  basename: LIPASE, AMYLASE,  in the last 168 hours No results found for this basename: AMMONIA,  in the last 168 hours CBC:  Recent Labs Lab 03/22/12 0950  WBC 13.5*  NEUTROABS 11.9*  HGB 14.7  HCT 42.5  MCV 85.2  PLT 232   Cardiac Enzymes:  Recent Labs Lab 03/22/12 1633 03/22/12 2126 03/23/12 0225  TROPONINI <0.30 <0.30 <0.30   BNP:  Recent Labs Lab 03/22/12 0950  PROBNP 4469.0*   D-Dimer: No results found for this basename: DDIMER,  in the last 168 hours CBG:  Recent Labs Lab 03/24/12 1154 03/24/12 1623 03/24/12 2035 03/25/12 0122 03/25/12 0429 03/25/12 0729  GLUCAP 239* 168* 197* 129* 133* 137*   Hemoglobin A1C: No results found for this basename: HGBA1C,  in the last 168 hours Fasting Lipid Panel: No results found for this basename: CHOL, HDL, LDLCALC, TRIG, CHOLHDL, LDLDIRECT,  in the last 168 hours Thyroid Function Tests: No results found for this basename: TSH, T4TOTAL, FREET4, T3FREE, THYROIDAB,  in the last 168 hours Coagulation: No results found for this basename: LABPROT, INR,  in the last 168 hours Anemia Panel: No results found for this basename:  VITAMINB12, FOLATE, FERRITIN, TIBC, IRON, RETICCTPCT,  in the last 168 hours Urine Drug Screen: Drugs of Abuse     Component Value Date/Time   LABOPIA NONE DETECTED 03/22/2012 1127   LABOPIA NEG 02/01/2012 1356   COCAINSCRNUR POSITIVE* 03/22/2012 1127   COCAINSCRNUR PPS 02/01/2012 1356   LABBENZ POSITIVE* 03/22/2012 1127   LABBENZ PPS 02/01/2012 1356   LABBENZ NEG 07/09/2010 1522   AMPHETMU NONE DETECTED 03/22/2012 1127   AMPHETMU NEG 07/09/2010 1522   THCU POSITIVE* 03/22/2012 1127   LABBARB NONE DETECTED 03/22/2012 1127   LABBARB NEG 02/01/2012 1356    Alcohol Level: No results found for this basename: ETH,  in the last 168 hours Urinalysis:  Recent Labs Lab 03/22/12 1127 03/22/12 2340  COLORURINE AMBER* YELLOW  LABSPEC 1.026 1.008  PHURINE 5.5 5.5  GLUCOSEU NEGATIVE NEGATIVE  HGBUR TRACE* TRACE*   BILIRUBINUR SMALL* NEGATIVE  KETONESUR 15* NEGATIVE  PROTEINUR >300* NEGATIVE  UROBILINOGEN 4.0* 1.0  NITRITE NEGATIVE NEGATIVE  LEUKOCYTESUR NEGATIVE NEGATIVE   Misc. Labs:  Micro Results: Recent Results (from the past 240 hour(s))  MRSA PCR SCREENING     Status: None   Collection Time    03/22/12  3:51 PM      Result Value Range Status   MRSA by PCR NEGATIVE  NEGATIVE Final   Comment:            The GeneXpert MRSA Assay (FDA     approved for NASAL specimens     only), is one component of a     comprehensive MRSA colonization     surveillance program. It is not     intended to diagnose MRSA     infection nor to guide or     monitor treatment for     MRSA infections.  CULTURE, BLOOD (ROUTINE X 2)     Status: None   Collection Time    03/22/12  4:16 PM      Result Value Range Status   Specimen Description BLOOD ARM LEFT   Final   Special Requests BOTTLES DRAWN AEROBIC AND ANAEROBIC 10CC   Final   Culture  Setup Time 03/22/2012 22:59   Final   Culture     Final   Value:        BLOOD CULTURE RECEIVED NO GROWTH TO DATE CULTURE WILL BE HELD FOR 5 DAYS BEFORE ISSUING A FINAL NEGATIVE REPORT   Report Status PENDING   Incomplete  CULTURE, BLOOD (ROUTINE X 2)     Status: None   Collection Time    03/22/12  4:26 PM      Result Value Range Status   Specimen Description BLOOD HAND LEFT   Final   Special Requests BOTTLES DRAWN AEROBIC AND ANAEROBIC 10CC   Final   Culture  Setup Time 03/22/2012 22:59   Final   Culture     Final   Value:        BLOOD CULTURE RECEIVED NO GROWTH TO DATE CULTURE WILL BE HELD FOR 5 DAYS BEFORE ISSUING A FINAL NEGATIVE REPORT   Report Status PENDING   Incomplete  URINE CULTURE     Status: None   Collection Time    03/22/12 11:40 PM      Result Value Range Status   Specimen Description URINE, RANDOM   Final   Special Requests Normal   Final   Culture  Setup Time 03/23/2012 09:46   Final   Colony Count NO GROWTH   Final   Culture  NO GROWTH   Final    Report Status 03/24/2012 FINAL   Final  GC/CHLAMYDIA PROBE AMP     Status: None   Collection Time    03/22/12 11:42 PM      Result Value Range Status   CT Probe RNA NEGATIVE  NEGATIVE Final   GC Probe RNA NEGATIVE  NEGATIVE Final   Comment: (NOTE)                                                                                              Normal Reference Range: Negative          Assay performed using the Gen-Probe APTIMA COMBO2 (R) Assay.     Acceptable specimen types for this assay include APTIMA Swabs (Unisex,     endocervical, urethral, or vaginal), first void urine, and ThinPrep     liquid based cytology samples.   Studies/Results: No results found. Medications:  Scheduled Meds: . atorvastatin  10 mg Oral q1800  . azithromycin  500 mg Oral Q24H  . cefTRIAXone (ROCEPHIN)  IV  1 g Intravenous Q24H  . digoxin  0.125 mg Oral Daily  . heparin  5,000 Units Subcutaneous Q8H  . hydrALAZINE  12.5 mg Oral Q8H  . insulin aspart  0-9 Units Subcutaneous Q4H  . isosorbide mononitrate  60 mg Oral Daily  . mirtazapine  30 mg Oral QHS  . pantoprazole  40 mg Oral Daily  . sodium chloride  3 mL Intravenous Q12H  . sodium chloride  3 mL Intravenous Q12H   Continuous Infusions: . milrinone 0.25 mcg/kg/min (03/25/12 0426)   PRN Meds:.sodium chloride, diazepam, ondansetron (ZOFRAN) IV, ondansetron, oxyCODONE, sodium chloride Assessment/Plan:  1. Bronchitis vs. CAP:  Improved significantly. Likely community acquired pneumonia vs. Bronchitis. Patient did not spike fever. Blood culture negative so far. Negative urine antigen for streptococcal and Legionella. Lung auscultation is CTAB today. Patient still has productive cough which has also improved.  -will continue Azithromycin 500 mg po QD (Start day 03/22/12).  Continue for seven days.    -will d/c IV Ceftriaxone -monitor SpO2 and add Handley as needed -pending final Blood cultures and sputum cultures  2.Acute on chronic systolic heart failure:  History of nonischemic cardiomyopathy due to alcohol abuse, systolic heart failure, s/p ICD. This is in the setting of possible bronchitis vs pneumonia and recent cocaine use.  ProBNP = 4469, consistent with CHF exacerbation on admission. Troponin negative x3. Repeated a 2-D echo showed EF remains 50%. patient had 3950 ml urine. Cre start trending down1.89-->1.71. K=3.4. BW 219-->208 LBs (dry weight = 211-214). Patient's major issue is weak pump rather than fluid overload. Patient's volume status is euvolumic now. Milrinone started on 03/23/12 per Heart failure team.  - Appreciate Heart failure team consult's in managing our patient. Will follow up recommendation.   - May continue to hold diuretics - Continue to holding ACEi and BB due to low output and Cre up. - Isosorbide mononitrate 60mg  QD   - Continue home CPAP at night   - Start hydralazine/nitrates as he improves.   - Continue home digoxin 0.125mg  po QD (digoxin level  was subtherapeutic at < 0.3 on admission. patient may not have taken it at home). - Spironolactone held due to hyperkalemia in outpatient setting.  - will charge his diet from card modified medium calore to high calore.  3. DM type II   -Currently well controlled. Recent Hga1c = 5.4.   -Sliding scale insulin for management during admission.   4. Hypertension   -Currently stable. Add hydralazine as patient improves.  5. Penile Discharge: Negative gonorrhea and chlamydia probs. Urinalysis 3/5: Negative for nitrites and leukocytes. His symptoms improved.  --patient is on azithromycin due to bronchitis or CAP.  6. CKD:    -Creatinine 1.89-->1.71 - follow up BMP.  7. FEN/GI   - Diet: Carb Modified   -Tox screen positive for tetrhydrocannabinol, cocaine, benzodiazepines   -Zofran prn for nausea   8. Peptic Ulcer Disease  -Continue home omeprazole 40mg  QD po    9. Prophylaxis   -Subcutaneous heparin   LOS: 3 days   Lorretta Harp 03/25/2012, 11:25 AM

## 2012-03-25 NOTE — Progress Notes (Signed)
Advanced Heart Failure Rounding Note   Subjective:    [name] is a 47 y/o male with HTN and DM2 with longstanding HF due to NICM (probably ETOH), EF 15%. Cath in 2008 showed left circumflex with 40% stenosis. He has chronic pain and was diagnosed with myasthenia gravis in September 2012 requiring plasmapheresis exchange and mestonin. He was followed by [name] but was discharged from the program. Admitted for recurrent HF and possible bronchitis.  Echo 03/23/12: EF 15%.  RV mod dilated and mild-mod reduced systolic function.  Mildly dilated LA and RA.  PAPP 45 mmHg.   Remains on milrinone. Weight down another 2 pounds off lasix. (14 pounds total). Cr improving. Breathing better.     Objective:   Weight Range:  Vital Signs:   Temp:  [97.5 F (36.4 C)-99.1 F (37.3 C)] 97.5 F (36.4 C) (03/08 0737) Pulse Rate:  [83-102] 95 (03/08 1013) Resp:  [16-23] 18 (03/08 1013) BP: (118-134)/(70-77) 118/73 mmHg (03/08 1013) SpO2:  [93 %-99 %] 96 % (03/08 1013) Weight:  [94.6 kg (208 lb 8.9 oz)-95.6 kg (210 lb 12.2 oz)] 94.6 kg (208 lb 8.9 oz) (03/08 0737) Last BM Date: 03/24/12  Weight change: Filed Weights   03/24/12 0600 03/24/12 1834 03/25/12 0737  Weight: 98.6 kg (217 lb 6 oz) 95.6 kg (210 lb 12.2 oz) 94.6 kg (208 lb 8.9 oz)    Intake/Output:   Intake/Output Summary (Last 24 hours) at 03/25/12 1238 Last data filed at 03/25/12 1000  Gross per 24 hour  Intake 1562.13 ml  Output   2400 ml  Net -837.87 ml     Physical Exam: General: No resp difficulty  HEENT: normal  Neck: supple. JVP flat, +mild HJR. Carotids 2+ bilat; no bruits. No lymphadenopathy or thryomegaly appreciated.  Cor: PMI laterally displaced. Regular rate and rhythm. 2/6 MR.  Lungs: clear .  Abdomen: soft, nontender, mild distention. No hepatosplenomegaly. No bruits or masses. Good bowel sounds.  Extremities: no cyanosis, clubbing, rash, edema  Neuro: alert & orientedx3, cranial nerves grossly  intact. moves all 4 extremities w/o difficulty. Affect pleasant   Telemetry: SR  Labs: Basic Metabolic Panel:  Recent Labs Lab 03/22/12 0950 03/23/12 0226 03/24/12 0817 03/24/12 2321 03/25/12 0525  NA 137 136 137  --  135  K 3.7 3.7 3.5  --  3.4*  CL 102 104 97  --  96  CO2 21 24 27   --  29  GLUCOSE 133* 117* 128*  --  135*  BUN 18 20 24*  --  25*  CREATININE 1.62* 1.69* 1.89*  --  1.71*  CALCIUM 8.8 8.2* 9.0  --  9.2  MG  --  1.8  --  2.1  --     Liver Function Tests:  Recent Labs Lab 03/22/12 0950 03/23/12 0226  AST 18 14  ALT 15 11  ALKPHOS 71 69  BILITOT 1.9* 1.1  PROT 6.9 6.3  ALBUMIN 3.1* 2.5*   No results found for this basename: LIPASE, AMYLASE,  in the last 168 hours No results found for this basename: AMMONIA,  in the last 168 hours  CBC:  Recent Labs Lab 03/22/12 0950  WBC 13.5*  NEUTROABS 11.9*  HGB 14.7  HCT 42.5  MCV 85.2  PLT 232    Cardiac Enzymes:  Recent Labs Lab 03/22/12 1633 03/22/12 2126 03/23/12 0225  TROPONINI <0.30 <0.30 <0.30    BNP: BNP (last 3 results)  Recent Labs  09/03/11 1502 10/05/11 1947 03/22/12 0950  PROBNP 3290.0* 5931.0* 4469.0*      Imaging: No results found.   Medications:     Scheduled Medications: . atorvastatin  10 mg Oral q1800  . azithromycin  500 mg Oral Q24H  . digoxin  0.125 mg Oral Daily  . heparin  5,000 Units Subcutaneous Q8H  . hydrALAZINE  12.5 mg Oral Q8H  . insulin aspart  0-9 Units Subcutaneous Q4H  . isosorbide mononitrate  60 mg Oral Daily  . mirtazapine  30 mg Oral QHS  . pantoprazole  40 mg Oral Daily  . sodium chloride  3 mL Intravenous Q12H  . sodium chloride  3 mL Intravenous Q12H    Infusions: . milrinone 0.25 mcg/kg/min (03/25/12 0426)    PRN Medications: sodium chloride, diazepam, ondansetron (ZOFRAN) IV, ondansetron, oxyCODONE, sodium chloride   Assessment:   1. Acute on chronic biventricular heart failure  2. NICM, EF 15%  3. ?CAP febrile  with fever/chills  4. Chronic renal failure  5. Nonobstructive CAD  6. Polysubstance abuse  7. DM type 2  Plan/Discussion:    Looks much better. Euvolemic. Will begin to wean milrinone. Restart home diuretic regimen. Anticipate stopping milrinone tomorrow and home on Monday.   Hold off on ACE and b-blocker due to low output and renal failure. Titrate hydralazine/nitrates as BP tolerates.  Will re-enroll in our Paramedicine program.    Length of Stay: 3 [name] 12:38 PM

## 2012-03-26 LAB — CBC
HCT: 44.4 % (ref 39.0–52.0)
Hemoglobin: 15.9 g/dL (ref 13.0–17.0)
MCH: 30.3 pg (ref 26.0–34.0)
MCHC: 35.8 g/dL (ref 30.0–36.0)
MCV: 84.6 fL (ref 78.0–100.0)

## 2012-03-26 LAB — URINALYSIS, ROUTINE W REFLEX MICROSCOPIC
Bilirubin Urine: NEGATIVE
Ketones, ur: NEGATIVE mg/dL
Nitrite: NEGATIVE
pH: 5.5 (ref 5.0–8.0)

## 2012-03-26 LAB — BASIC METABOLIC PANEL
BUN: 22 mg/dL (ref 6–23)
Chloride: 98 mEq/L (ref 96–112)
Glucose, Bld: 150 mg/dL — ABNORMAL HIGH (ref 70–99)
Potassium: 4.1 mEq/L (ref 3.5–5.1)

## 2012-03-26 LAB — GLUCOSE, CAPILLARY: Glucose-Capillary: 204 mg/dL — ABNORMAL HIGH (ref 70–99)

## 2012-03-26 MED ORDER — TORSEMIDE 20 MG PO TABS
60.0000 mg | ORAL_TABLET | Freq: Every day | ORAL | Status: DC
  Administered 2012-03-26 – 2012-03-27 (×2): 60 mg via ORAL
  Filled 2012-03-26 (×2): qty 3

## 2012-03-26 MED ORDER — POTASSIUM CHLORIDE CRYS ER 10 MEQ PO TBCR
10.0000 meq | EXTENDED_RELEASE_TABLET | Freq: Every day | ORAL | Status: DC
  Administered 2012-03-26 – 2012-03-27 (×2): 10 meq via ORAL
  Filled 2012-03-26 (×2): qty 1

## 2012-03-26 NOTE — Progress Notes (Signed)
Placed pt. On CPAP, via FFM 12.0 cm H20 with 2 lpm O2 bleed in.  Pt. Tolerating well at this time.

## 2012-03-26 NOTE — Progress Notes (Signed)
Pt ha a 5 beat run of VT. Pt asym. Back in SR. Will continue to monitor

## 2012-03-26 NOTE — Progress Notes (Signed)
Subjective: No acute events overnight.  Patient reports that his wife was recently diagnosed with trichomoniasis and he would like to be checked for this.     Objective: Vital signs in last 24 hours: Filed Vitals:   03/25/12 2054 03/25/12 2125 03/26/12 0558 03/26/12 0920  BP: 119/71  120/79 117/78  Pulse: 100 98 88 83  Temp: 98.2 F (36.8 C)  98.2 F (36.8 C)   TempSrc: Oral  Oral   Resp: 20 20 20 18   Height:      Weight:      SpO2: 100% 100% 97% 98%   Weight change: -1 kg (-2 lb 3.3 oz)  Intake/Output Summary (Last 24 hours) at 03/26/12 4098 Last data filed at 03/25/12 2030  Gross per 24 hour  Intake 991.06 ml  Output    650 ml  Net 341.06 ml   Physical exam General:Patient is eating comfortably at his bed. HEENT: PERRL, EOMI, no scleral icterus, Did not appreciate JVD or bruit.  Cardiac: RRR with +S3.  Pulm: Clear to auscultation bilaterally. No wheezing, or rubs.  Abd: Soft, nondistended, nontender, no rebound pain, no organomegaly, BS present  Ext: No pitting edema. 2+DP/PT pulse bilaterally  Musculoskeletal: No joint deformities, erythema, or stiffness, ROM full  Skin: No rashes.  Neuro: Alert and oriented X3, cranial nerves II-XII grossly intact, muscle strength 5/5 in all extremeties, sensation to light touch intact. Brachial reflex 2+ bilaterally. Knee reflex 1+ bilaterally. Negative Babinski's sign. Normal finger to nose test.  Psych: Patient is not psychotic, no suicidal or hemocidal ideation.   Lab Results: @labtest2 @ Micro Results: Recent Results (from the past 240 hour(s))  MRSA PCR SCREENING     Status: None   Collection Time    03/22/12  3:51 PM      Result Value Range Status   MRSA by PCR NEGATIVE  NEGATIVE Final   Comment:            The GeneXpert MRSA Assay (FDA     approved for NASAL specimens     only), is one component of a     comprehensive MRSA colonization     surveillance program. It is not     intended to diagnose MRSA     infection  nor to guide or     monitor treatment for     MRSA infections.  CULTURE, BLOOD (ROUTINE X 2)     Status: None   Collection Time    03/22/12  4:16 PM      Result Value Range Status   Specimen Description BLOOD ARM LEFT   Final   Special Requests BOTTLES DRAWN AEROBIC AND ANAEROBIC 10CC   Final   Culture  Setup Time 03/22/2012 22:59   Final   Culture     Final   Value:        BLOOD CULTURE RECEIVED NO GROWTH TO DATE CULTURE WILL BE HELD FOR 5 DAYS BEFORE ISSUING A FINAL NEGATIVE REPORT   Report Status PENDING   Incomplete  CULTURE, BLOOD (ROUTINE X 2)     Status: None   Collection Time    03/22/12  4:26 PM      Result Value Range Status   Specimen Description BLOOD HAND LEFT   Final   Special Requests BOTTLES DRAWN AEROBIC AND ANAEROBIC 10CC   Final   Culture  Setup Time 03/22/2012 22:59   Final   Culture     Final   Value:  BLOOD CULTURE RECEIVED NO GROWTH TO DATE CULTURE WILL BE HELD FOR 5 DAYS BEFORE ISSUING A FINAL NEGATIVE REPORT   Report Status PENDING   Incomplete  URINE CULTURE     Status: None   Collection Time    03/22/12 11:40 PM      Result Value Range Status   Specimen Description URINE, RANDOM   Final   Special Requests Normal   Final   Culture  Setup Time 03/23/2012 09:46   Final   Colony Count NO GROWTH   Final   Culture NO GROWTH   Final   Report Status 03/24/2012 FINAL   Final  GC/CHLAMYDIA PROBE AMP     Status: None   Collection Time    03/22/12 11:42 PM      Result Value Range Status   CT Probe RNA NEGATIVE  NEGATIVE Final   GC Probe RNA NEGATIVE  NEGATIVE Final   Comment: (NOTE)                                                                                              Normal Reference Range: Negative          Assay performed using the Gen-Probe APTIMA COMBO2 (R) Assay.     Acceptable specimen types for this assay include APTIMA Swabs (Unisex,     endocervical, urethral, or vaginal), first void urine, and ThinPrep     liquid based cytology  samples.   Studies/Results: No results found. Medications: I have reviewed the patient's current medications. Scheduled Meds: . atorvastatin  10 mg Oral q1800  . azithromycin  500 mg Oral Q24H  . digoxin  0.125 mg Oral Daily  . heparin  5,000 Units Subcutaneous Q8H  . hydrALAZINE  12.5 mg Oral Q8H  . insulin aspart  0-9 Units Subcutaneous TID WC  . isosorbide mononitrate  60 mg Oral Daily  . mirtazapine  30 mg Oral QHS  . pantoprazole  40 mg Oral Daily  . sodium chloride  3 mL Intravenous Q12H  . sodium chloride  3 mL Intravenous Q12H   Continuous Infusions: . milrinone 0.25 mcg/kg/min (03/25/12 1959)   PRN Meds:.sodium chloride, diazepam, ondansetron (ZOFRAN) IV, ondansetron, oxyCODONE, sodium chloride  Assessment/Plan:Mr. Geurts is a 47 year old African American male with a past medical history of non-ischemic cardiomyopathy, CHF, DM type II, CKD, and HTN who presents with low output congestive heart failure in the setting of possible pneumonia. Patient is euvolemic today.  1. Bronchitis vs. CAP: Continue to improve after d/c'ed IV ceftriaxone. No fever or chills. No chest pain. Productive cough also improved. Negative urine antigen for strep pnemo and legionella. Blood and sputum cultures are no growth to date.   -continue Azithromycin 500 mg po QD (Start day 03/22/12). Continue for seven days.  -monitor SpO2 and add [place] as needed   2.Acute on chronic systolic heart failure: History of nonischemic cardiomyopathy due to alcohol abuse, systolic heart failure, s/p ICD. This is in the setting of possible bronchitis vs pneumonia and recent cocaine use. Repeated 2-D echo demonstrates 15% echo consistent with last echo.    -Patient is  euvolemic today.   -Per heart failure team,  milrinone was d/c'ed and will  restart home diuretic : torseminde 60 daily - Continue to hold ACEi and BB due to low output. -Daily weights (dry weight = 211-214). Weighs 208 lbs on 03/25/12.  -atorvastatin 10mg  QD   -Isosorbide mononitrate 60mg  QD  -Continue home CPAP at night  -Continue home digoxin 0.125mg  po QD (digoxin level is subtherapeutic at < 0.3) Manage in outpatient setting.  -Spironolactone held due to hyperkalemia in outpatient setting.   3. DM type II  -Currently well controlled. Recent Hga1c = 5.4.  -Sliding scale insulin for management during admission.   4. Hypertension  -Currently stable. On hydralazine and imdure.   5. Penile Discharge: Patient reports recent exposure to trichomoniasis. -Patient has received empiric treatment for urethritis. -Evaluate with urinalysis and gonorrhea and chlamydia swab: Results are negative.  -Urinalysis 3/5: Negative for nitrites and leukocytes, otherwise unchanged from baseline.  -Treated empirically with azithromycin 1000mg  po once, ceftriaxone 250mg  IM once   6. CKD  -Creatinine at 1.44 today (from 1.71 yesterday). Continue to monitor.  -Monitor Is/Os   Recent Labs Lab 03/22/12 0950 03/23/12 0226 03/24/12 0817 03/25/12 0525 03/26/12 0709  CREATININE 1.62* 1.69* 1.89* 1.71* 1.44*     7. FEN/GI  - Diet: Carb Modified  -Tox screen positive for tetrhydrocannabinol, cocaine, benzodiazepines  -Zofran prn for nausea   8. Peptic Ulcer Disease  -Continue oral protonix 40 mg daily.  9. Chronic pain management  -Controlled with oxycodone 5q8 po prn and diazepam 5mg  TID po prn. Consider switching to scheduled benzodiazepines given substance abuse history.   10. Prophylaxis  -Subcutaneous heparin  @PROBHOSP @  LOS: 4 days   Lysbeth Penner 03/26/2012, 9:27 AM    Resident Addendum to Medical Student Note   I have seen and examined the patient, and agree with the the medical student assessment and plan outlined above. Please see my brief note below for additional details.  OBJECTIVE: VS: Reviewed  Meds: Reviewed  Labs: Reviewed  Imaging: Reviewed   General: Not in acute distress  HEENT: PERRL, EOMI, no scleral  icterus, Did not appreciate JVD. No bruit.  Cardiac: RRR with +S3.   Pulm: CTAB ,No rales, wheezing, or rubs.   Abd: Soft, nondistended, nontender, no rebound pain, no organomegaly, BS present  Ext: No leg edema.  2+DP/PT pulse bilaterally   Musculoskeletal: No joint deformities, erythema, or stiffness, ROM full  Skin: No rashes.   Neuro: Alert and oriented X3, cranial nerves II-XII grossly intact, muscle strength 5/5 in all extremeties, sensation to light touch intact.   Psych: Patient is not psychotic, no suicidal or hemocidal ideation.  ASSESSMENT/ PLAN:  1. Bronchitis vs. CAP:  Improved significantly. Likely community acquired pneumonia vs. Bronchitis. Patient did not spike fever. Blood culture negative so far. Negative urine antigen for streptococcal and Legionella. Lung auscultation is CTAB today. Patient still has mild productive cough which has also improved.  -will continue Azithromycin 500 mg po QD (Start day 03/22/12).  Continue for seven days.    -monitor SpO2 and add [place] as needed -pending final Blood cultures and sputum cultures  2.Acute on chronic systolic heart failure: History of nonischemic cardiomyopathy due to alcohol abuse, systolic heart failure, s/p ICD. This is in the setting of possible bronchitis vs pneumonia and recent cocaine use.  ProBNP = 4469, consistent with CHF exacerbation on admission. Troponin negative x3. Repeated a 2-D echo showed EF remains 50%. patient had 3950 ml urine. Cre  start trending down1.89-->1.71. K=3.4. BW 219-->208 LBs (dry weight = 211-214). Patient's major issue is weak pump rather than fluid overload. Patient's volume status is euvolumic now. Milrinone was d/c'ed per Heart failure team.   - Appreciate Heart failure team consult's in managing our patient. Will follow up recommendation.   - Switch to home torsemide 60 mg daily. - Continue to holding ACEi and BB due to low output and Cre up. - Isosorbide mononitrate 60mg  QD   - Continue home  CPAP at night   - Started hydralazine/ imdure   - Continue home digoxin 0.125mg  po QD (digoxin level was subtherapeutic at < 0.3 on admission. patient may not have taken it at home). - Spironolactone held due to hyperkalemia in outpatient setting.    3. DM type II   -Currently well controlled. Recent Hga1c = 5.4.   -Sliding scale insulin for management during admission.   4. Hypertension   -Currently stable. On hydralazine as patient improves.  5. Penile Discharge: Negative gonorrhea and chlamydia probs. Urinalysis 3/5: Negative for nitrites and leukocytes. His symptoms improved. --patient is on azithromycin due to bronchitis or CAP.  6. CKD:    -Creatinine 1.89-->1.71-->1.44 - follow up BMP.  7. FEN/GI   - Diet: Carb Modified   -Tox screen positive for tetrhydrocannabinol, cocaine, benzodiazepines   -Zofran prn for nausea   8. Peptic Ulcer Disease  -Continue protonix 40mg  QD po    9. Prophylaxis   -Subcutaneous heparin   Length of Stay: 4  Lorretta Harp, MD PGY1, Internal Medicine Teaching Service Pager: [phone]   03/26/2012, 3:22 PM

## 2012-03-26 NOTE — Progress Notes (Signed)
Subjective:    [name] is a 47 y/o male with HTN and DM2 with longstanding HF due to NICM (probably ETOH), EF 15%. Cath in 2008 showed left circumflex with 40% stenosis. He has chronic pain and was diagnosed with myasthenia gravis in September 2012 requiring plasmapheresis exchange and mestonin. He was followed by [facility] but was discharged from the program. Admitted for recurrent HF and possible bronchitis.  Echo 03/23/12: EF 15%.  RV mod dilated and mild-mod reduced systolic function.  Mildly dilated LA and RA.  PAPP 45 mmHg.   Dyspnea improving; no chest pain.    Objective:   Weight Range:  Vital Signs:   Temp:  [97.9 F (36.6 C)-98.2 F (36.8 C)] 98.2 F (36.8 C) (03/09 0558) Pulse Rate:  [83-100] 83 (03/09 0920) Resp:  [18-20] 18 (03/09 0920) BP: (117-126)/(70-79) 117/78 mmHg (03/09 0920) SpO2:  [95 %-100 %] 98 % (03/09 0920) Last BM Date: 03/25/12  Weight change: Filed Weights   03/24/12 0600 03/24/12 1834 03/25/12 0737  Weight: 217 lb 6 oz (98.6 kg) 210 lb 12.2 oz (95.6 kg) 208 lb 8.9 oz (94.6 kg)    Intake/Output:   Intake/Output Summary (Last 24 hours) at 03/26/12 1235 Last data filed at 03/25/12 2030  Gross per 24 hour  Intake 871.06 ml  Output    650 ml  Net 221.06 ml     Physical Exam: General: No resp difficulty  HEENT: normal  Neck: supple. JVP flat Cor: PMI laterally displaced. Regular rate and rhythm. 2/6 MR.  Lungs: clear .  Abdomen: soft, nontender, not distended Extremities: no edema  Neuro: alert & orientedx3, cranial nerves grossly intact. moves all 4 extremities w/o difficulty.    Telemetry: SR  Labs: Basic Metabolic Panel:  Recent Labs Lab 03/22/12 0950 03/23/12 0226 03/24/12 0817 03/24/12 2321 03/25/12 0525 03/26/12 0709  NA 137 136 137  --  135 135  K 3.7 3.7 3.5  --  3.4* 4.1  CL 102 104 97  --  96 98  CO2 21 24 27   --  29 27  GLUCOSE 133* 117* 128*  --  135* 150*  BUN 18 20 24*  --  25* 22  CREATININE  1.62* 1.69* 1.89*  --  1.71* 1.44*  CALCIUM 8.8 8.2* 9.0  --  9.2 9.0  MG  --  1.8  --  2.1  --   --     Liver Function Tests:  Recent Labs Lab 03/22/12 0950 03/23/12 0226  AST 18 14  ALT 15 11  ALKPHOS 71 69  BILITOT 1.9* 1.1  PROT 6.9 6.3  ALBUMIN 3.1* 2.5*    CBC:  Recent Labs Lab 03/22/12 0950 03/26/12 0709  WBC 13.5* 5.4  NEUTROABS 11.9*  --   HGB 14.7 15.9  HCT 42.5 44.4  MCV 85.2 84.6  PLT 232 278    Cardiac Enzymes:  Recent Labs Lab 03/22/12 1633 03/22/12 2126 03/23/12 0225  TROPONINI <0.30 <0.30 <0.30    BNP: BNP (last 3 results)  Recent Labs  09/03/11 1502 10/05/11 1947 03/22/12 0950  PROBNP 3290.0* 5931.0* 4469.0*      Medications:     Scheduled Medications: . atorvastatin  10 mg Oral q1800  . azithromycin  500 mg Oral Q24H  . digoxin  0.125 mg Oral Daily  . heparin  5,000 Units Subcutaneous Q8H  . hydrALAZINE  12.5 mg Oral Q8H  . insulin aspart  0-9 Units Subcutaneous TID WC  . isosorbide mononitrate  60 mg Oral Daily  . mirtazapine  30 mg Oral QHS  . pantoprazole  40 mg Oral Daily  . sodium chloride  3 mL Intravenous Q12H  . sodium chloride  3 mL Intravenous Q12H    Infusions: . milrinone 0.25 mcg/kg/min (03/25/12 1959)    PRN Medications: sodium chloride, diazepam, ondansetron (ZOFRAN) IV, ondansetron, oxyCODONE, sodium chloride   Assessment:   1. Acute on chronic biventricular heart failure  2. NICM, EF 15%  3. ?CAP febrile with fever/chills  4. Chronic renal failure  5. Nonobstructive CAD  6. Polysubstance abuse  7. DM type 2  Plan/Discussion:    Looks much better. Euvolemic. Will DC milrinone. Restart home diuretic regimen. Anticipate home on Monday.   Hold off on ACE and b-blocker due to low output and renal failure. Titrate hydralazine/nitrates as BP tolerates.   Length of Stay: 4 [place] 12:35 PM

## 2012-03-27 LAB — GLUCOSE, CAPILLARY
Glucose-Capillary: 155 mg/dL — ABNORMAL HIGH (ref 70–99)
Glucose-Capillary: 146 mg/dL — ABNORMAL HIGH (ref 70–99)

## 2012-03-27 LAB — BASIC METABOLIC PANEL
CO2: 28 mEq/L (ref 19–32)
Chloride: 97 mEq/L (ref 96–112)
GFR calc non Af Amer: 50 mL/min — ABNORMAL LOW (ref >90)
Glucose, Bld: 196 mg/dL — ABNORMAL HIGH (ref 70–99)
Potassium: 4.2 mEq/L (ref 3.5–5.1)
Sodium: 134 mEq/L — ABNORMAL LOW (ref 135–145)

## 2012-03-27 MED ORDER — HYDRALAZINE HCL 25 MG PO TABS: 12.5000 mg | ORAL_TABLET | Freq: Three times a day (TID) | ORAL | Status: DC

## 2012-03-27 MED ORDER — TORSEMIDE 20 MG PO TABS: 60.0000 mg | ORAL_TABLET | Freq: Every day | ORAL | Status: DC

## 2012-03-27 MED ORDER — AZITHROMYCIN 500 MG PO TABS: 500.0000 mg | ORAL_TABLET | ORAL | Status: DC

## 2012-03-27 MED ORDER — POTASSIUM CHLORIDE ER 10 MEQ PO TBCR: 10.0000 meq | EXTENDED_RELEASE_TABLET | Freq: Every day | ORAL | Status: DC

## 2012-03-27 MED ORDER — HYDROCHLOROTHIAZIDE 25 MG PO TABS: 37.5000 mg | ORAL_TABLET | Freq: Three times a day (TID) | ORAL | Status: DC

## 2012-03-27 NOTE — Evaluation (Signed)
Occupational Therapy Evaluation Patient Details Name: [name] MRN: [id number] DOB: [birth year] Today's Date: 03/27/2012 Time: 8295-6213 OT Time Calculation (min): 20 min  OT Assessment / Plan / Recommendation Clinical Impression   Pt is a 47 y/o L HD male, whom presents w/ dx PNA, CHF, SOB and otherwise sig. PMH (see chart for details). He demonstrated independence w/ functional mobility and transfers as well as UB/LB ADL's. He was instructed in energy conservation techniques and verbalized understanding. He does not have any further OT needs at this time, will sign off. Note: pt expressed concerns re: caring for 3 young children & states no one to assist in their care (currently w/ cousin) at d/c RN staff made aware of this.    OT Assessment  Patient does not need any further OT services    Follow Up Recommendations  No OT follow up    Barriers to Discharge      Equipment Recommendations  None recommended by OT    Recommendations for Other Services    Frequency       Precautions / Restrictions Precautions Precautions: Fall Precaution Comments: Note: Pt w/ strict bed rest orders, OT paged & spoke w/ Dr. Zane Herald whom stated Ok to get pt up & OOB Restrictions Weight Bearing Restrictions: No Other Position/Activity Restrictions: Note pt w/ strict bed rest order, called & spoke w/ Dr. Zane Herald whom stated ok to get pt OOB.   Pertinent Vitals/Pain No c/o pain per pt report.    ADL  Eating/Feeding: Performed;Independent Where Assessed - Eating/Feeding: Bed level Grooming: Performed;Wash/dry hands;Independent Where Assessed - Grooming: Unsupported standing Upper Body Bathing: Simulated;Independent Where Assessed - Upper Body Bathing: Unsupported sitting;Unsupported standing Lower Body Bathing: Simulated;Modified independent Where Assessed - Lower Body Bathing: Unsupported sit to stand;Unsupported sitting Upper Body Dressing: Simulated;Independent Where Assessed - Upper Body Dressing:  Unsupported sitting Lower Body Dressing: Performed;Independent Where Assessed - Lower Body Dressing: Unsupported sitting;Unsupported sit to stand Toilet Transfer: Performed;Independent Toilet Transfer Method: Sit to Barista: Comfort height toilet Toileting - Clothing Manipulation and Hygiene: Performed;Independent Where Assessed - Toileting Clothing Manipulation and Hygiene: Standing;Sit to stand from 3-in-1 or toilet Tub/Shower Transfer Method: Not assessed Transfers/Ambulation Related to ADLs: Pt demonstrates independence w/ functional mobility and transfers as observed during OT assessment today. ADL Comments: Pt is independent w/ functional mobility/transfers and LB ADL's as observed during OT assessment. Pt was educated in energy conservation tech's for ADL's & homemanagement. He expressed concerns about caring for 3 young children (whom are currently w/ his cousin) @ d/c & states no one else to assist in their care, notified nursing staff. He does not demonstrate any further OT needs at this time, will sign off.    OT Diagnosis:    OT Problem List:   OT Treatment Interventions:     OT Goals    Visit Information  Last OT Received On: 03/27/12    Subjective Data  Subjective: "I might be going home today"  Patient Stated Goal: None stated   Prior Functioning     Home Living Lives With: Other (Comment) (3 kids ages 59, 54, 12 y/o currently w/ cousin) Available Help at Discharge: Other (Comment) (Pt states no help at d/c) Type of Home: House Home Access: Stairs to enter [employer] of Steps: 6 STE Entrance Stairs-Rails: None Home Layout: One level Bathroom Shower/Tub: Forensic scientist: Standard Bathroom Accessibility: No Home Adaptive Equipment: Shower chair with back;Hand-held shower hose Prior Function Level of Independence: Independent Able  to Take Stairs?: Yes Driving: No Vocation: On  disability Communication Communication: No difficulties Dominant Hand: Left    Vision/Perception Vision - History Patient Visual Report: No change from baseline   Cognition  Cognition Overall Cognitive Status: Appears within functional limits for tasks assessed/performed Arousal/Alertness: Awake/alert Orientation Level: Appears intact for tasks assessed Behavior During Session: [facility] for tasks performed    Extremity/Trunk Assessment Right Upper Extremity Assessment RUE ROM/Strength/Tone: Within functional levels RUE Coordination: WFL - gross/fine motor Left Upper Extremity Assessment LUE ROM/Strength/Tone: Within functional levels LUE Coordination: WFL - gross/fine motor     Mobility Bed mobility: supine to sit= independent, Sit scoot to EOB= Independent Transfers sit-stand and stand to sit= Independent Pt did not use rails or grab bars during functional mob/transfers, HOB flat.        Balance Balance Balance Assessed: Yes Static Sitting Balance Static Sitting - Balance Support: No upper extremity supported;Feet supported Dynamic Standing Balance Dynamic Standing - Balance Support: During functional activity;No upper extremity supported   End of Session OT - End of Session Activity Tolerance: Patient tolerated treatment well Patient left: in bed;with call bell/phone within reach Nurse Communication: Other (comment) (request gingerale & shower orders)  GO     Roselie Awkward Dixon 03/27/2012, 9:57 AM

## 2012-03-27 NOTE — Progress Notes (Signed)
Subjective: No acute events overnight.  Patient is feeling better and his weight is stable.  Objective: Vital signs in last 24 hours: Filed Vitals:   03/26/12 0920 03/26/12 1430 03/26/12 2147 03/27/12 0634  BP: 117/78 126/69 129/74 117/77  Pulse: 83 92 96 81  Temp:  98.2 F (36.8 C) 98 F (36.7 C) 98.3 F (36.8 C)  TempSrc:  Oral Oral Oral  Resp: 18 20 20 20   Height:      Weight:    94.7 kg (208 lb 12.4 oz)  SpO2: 98% 96% 97% 95%   Weight change: 0.1 kg (3.5 oz)  Intake/Output Summary (Last 24 hours) at 03/27/12 0940 Last data filed at 03/27/12 0643  Gross per 24 hour  Intake 1336.72 ml  Output   3450 ml  Net -2113.28 ml   Physical exam  General:Patient is resting comfortably in his bed. HEENT: PERRL, EOMI, no scleral icterus. Cardiac: RRR   Pulm: Clear to auscultation bilaterally. No wheezing, or rubs.  Abd: Soft, nondistended, nontender, no rebound pain, no organomegaly, BS present  Ext: No pitting edema. 2+DP/PT pulse bilaterally  Musculoskeletal: No joint deformities, erythema, or stiffness, ROM full  Skin: No rashes.  Neuro: Alert and oriented X3, cranial nerves II-XII grossly intact, muscle strength 5/5 in all extremeties, sensation to light touch intact.  Psych: Patient is not psychotic, no suicidal or hemocidal ideation.  Lab Results: @labtest2 @ Micro Results: Recent Results (from the past 240 hour(s))  MRSA PCR SCREENING     Status: None   Collection Time    03/22/12  3:51 PM      Result Value Range Status   MRSA by PCR NEGATIVE  NEGATIVE Final   Comment:            The GeneXpert MRSA Assay (FDA     approved for NASAL specimens     only), is one component of a     comprehensive MRSA colonization     surveillance program. It is not     intended to diagnose MRSA     infection nor to guide or     monitor treatment for     MRSA infections.  CULTURE, BLOOD (ROUTINE X 2)     Status: None   Collection Time    03/22/12  4:16 PM      Result Value Range  Status   Specimen Description BLOOD ARM LEFT   Final   Special Requests BOTTLES DRAWN AEROBIC AND ANAEROBIC 10CC   Final   Culture  Setup Time 03/22/2012 22:59   Final   Culture     Final   Value:        BLOOD CULTURE RECEIVED NO GROWTH TO DATE CULTURE WILL BE HELD FOR 5 DAYS BEFORE ISSUING A FINAL NEGATIVE REPORT   Report Status PENDING   Incomplete  CULTURE, BLOOD (ROUTINE X 2)     Status: None   Collection Time    03/22/12  4:26 PM      Result Value Range Status   Specimen Description BLOOD HAND LEFT   Final   Special Requests BOTTLES DRAWN AEROBIC AND ANAEROBIC 10CC   Final   Culture  Setup Time 03/22/2012 22:59   Final   Culture     Final   Value:        BLOOD CULTURE RECEIVED NO GROWTH TO DATE CULTURE WILL BE HELD FOR 5 DAYS BEFORE ISSUING A FINAL NEGATIVE REPORT   Report Status PENDING   Incomplete  URINE CULTURE  Status: None   Collection Time    03/22/12 11:40 PM      Result Value Range Status   Specimen Description URINE, RANDOM   Final   Special Requests Normal   Final   Culture  Setup Time 03/23/2012 09:46   Final   Colony Count NO GROWTH   Final   Culture NO GROWTH   Final   Report Status 03/24/2012 FINAL   Final  GC/CHLAMYDIA PROBE AMP     Status: None   Collection Time    03/22/12 11:42 PM      Result Value Range Status   CT Probe RNA NEGATIVE  NEGATIVE Final   GC Probe RNA NEGATIVE  NEGATIVE Final   Comment: (NOTE)                                                                                              Normal Reference Range: Negative          Assay performed using the Gen-Probe APTIMA COMBO2 (R) Assay.     Acceptable specimen types for this assay include APTIMA Swabs (Unisex,     endocervical, urethral, or vaginal), first void urine, and ThinPrep     liquid based cytology samples.   Studies/Results: No results found. Medications: I have reviewed the patient's current medications. Scheduled Meds: . atorvastatin  10 mg Oral q1800  . azithromycin   500 mg Oral Q24H  . digoxin  0.125 mg Oral Daily  . heparin  5,000 Units Subcutaneous Q8H  . hydrALAZINE  12.5 mg Oral Q8H  . insulin aspart  0-9 Units Subcutaneous TID WC  . isosorbide mononitrate  60 mg Oral Daily  . mirtazapine  30 mg Oral QHS  . pantoprazole  40 mg Oral Daily  . potassium chloride  10 mEq Oral Daily  . sodium chloride  3 mL Intravenous Q12H  . sodium chloride  3 mL Intravenous Q12H  . torsemide  60 mg Oral Daily   Continuous Infusions:  PRN Meds:.sodium chloride, diazepam, ondansetron (ZOFRAN) IV, ondansetron, oxyCODONE, sodium chloride  Assessment/Plan:[name] is a 47 year old African American male with a past medical history of non-ischemic cardiomyopathy, CHF, DM type II, CKD, and HTN who presents with low output congestive heart failure in the setting of possible pneumonia. Patient is euvolemic today.   1. Bronchitis vs. CAP, Improved: No fever, chills, or chest pain. Productive cough has improved. Negative urine antigen for strep pnemo and legionella. Blood cultures are no growth to date.  -continue Azithromycin 500 mg po QD (Start day 03/22/12). Continue for seven days until 03/28/12.   2.Acute on chronic systolic heart failure: History of nonischemic cardiomyopathy due to alcohol abuse, systolic heart failure, s/p ICD. This is in the setting of possible bronchitis vs pneumonia and recent cocaine use. Repeated 2-D echo demonstrates 15% echo consistent with last echo.  -Patient is euvolemic today.  -Per heart failure team, will restart following home regimen:  hydralazine 37.5 tid, imdur 60 daily, torsemide 60 daily, digoxin 0.12, Kc 10 daily - Continue to hold ACEi and BB due to low output. Reevaluate at outpatient cardiology  visit. -Weight today = 208lbs (dry weight = 211-214). Cr stable. -atorvastatin 10mg  QD   3. DM type II  -Sliding scale insulin for management during admission.   4. Hypertension  -Currently stable. On hydralazine and imdur .  5.  Penile Discharge:  -Patient has received empiric treatment for urethritis.  -Evaluate with urinalysis and gonorrhea and chlamydia swab: Results are negative.  -Urinalysis 3/5: Negative for nitrites and leukocytes, otherwise unchanged from baseline.  -Treated empirically with azithromycin 1000mg  po once, ceftriaxone 250mg  IM once   6. CKD  -Creatinine at baseline today (1.63).   Monitor in outpatient setting.  7. FEN/GI  - Diet: Carb Modified  -Tox screen positive for tetrhydrocannabinol, cocaine, benzodiazepines  -Zofran prn for nausea   8. Peptic Ulcer Disease  -Continue oral protonix 40 mg daily.   10. Prophylaxis  -Subcutaneous heparin  11.  Dispo:  Discharge today with f/up appointment in cardiology.  @PROBHOSP @  LOS: 5 days   [name] 03/27/2012, 9:40 AM        Resident Addendum to Medical Student Note   I have seen and examined the patient, and agree with the the medical student assessment and plan outlined above. Please see my brief note below for additional details.  OBJECTIVE: VS: Reviewed  Meds: Reviewed  Labs: Reviewed  Imaging: Reviewed   Physical exam  General: Patient is resting comfortably in his bed. HEENT: PERRL, EOMI, no scleral icterus. Cardiac: RRR   Pulm: Clear to auscultation bilaterally. No wheezing, or rubs.  Abd: Soft, nondistended, nontender, no rebound pain, no organomegaly, BS present  Ext: No pitting edema. 2+DP/PT pulse bilaterally  Musculoskeletal: No joint deformities, erythema, or stiffness, ROM full  Skin: No rashes.  Neuro: Alert and oriented X3, cranial nerves II-XII grossly intact, muscle strength 5/5 in all extremeties, sensation to light touch intact.  Psych: Patient is not psychotic, no suicidal or hemocidal ideation.   ASSESSMENT/ PLAN:  1. Bronchitis vs. CAP:  Likely Bronchitis vs. community acquired pneumonia. Patient did not spike fever. Blood culture negative so far. Negative urine antigen for  streptococcal and Legionella. Lung auscultation is CTAB today. Patient has no chest pain, fever or chills. His cough has resolved.   Plan: -To discharge patient on oral Azithromycin for another 2 days (total of 7 days).   2.Acute on chronic systolic heart failure: History of nonischemic cardiomyopathy due to alcohol abuse, systolic heart failure, s/p ICD. This is in the setting of possible bronchitis vs pneumonia and recent cocaine use.  ProBNP = 4469 , consistent with CHF exacerbation on admission. Troponin negative x3. Repeated a 2-D echo showed EF remains 50%. Cre start trending down1.89-->-->-->1.63. BW 219-->-->-->208 LBs (dry weight = 211-214). Patient's major issue is weak pump rather than fluid overload. Patient's volume status is euvolumic now. Per Dr. Gala Romney, patient is ready to be discharged on home regimen. Heart failure team will try to add back carvedilol 3.125 bid in clinic on 04/01/11..   -Plan:   -will discharge on home regimen per Dr. Gala Romney. Continue to holding ACEi and BB. - Appreciate Heart failure team consult's in managing our patient.   - Switch to home torsemide 60 mg daily. - Isosorbide mononitrate 60mg  QD   - Continue home CPAP at night   - Started hydralazine/ imdure   - Continue home digoxin 0.125mg  po QD.  3. DM type II   -Currently well controlled. Recent Hga1c = 5.4.   -Sliding scale insulin for management during admission.  -will not start  new med on discharge  4. Hypertension: continue hydralazine at discharge.   5. Penile Discharge: Negative gonorrhea and chlamydia probs. Urinalysis 3/5: Negative for nitrites and leukocytes. His symptoms resolved.  6. CKD:    -Creatinine peaked at 1.89 and trending down to 1.63 at discharge.   7. Peptic Ulcer Disease: continue home omeprazole at discharge.  Length of Stay: 5  Lorretta Harp, MD PGY1, Internal Medicine Teaching Service Pager: [phone]   03/27/2012, 1:25 PM

## 2012-03-27 NOTE — Discharge Summary (Signed)
Patient Name:  [name]  MRN: [id number]  PCP: Jonathan Deforest, MD  DOB:  10-24-1965       Date of Admission:  03/22/2012  Date of Discharge:  03/27/2012      Attending Physician: Dr. Lars Mage, MD         DISCHARGE DIAGNOSES: 1. Principal Problem: 2.   Acute on chronic systolic heart failure 3. Active Problems: 4.   DM2 (diabetes mellitus, type 2) 5.   Morbid obesity 6.   HTN (hypertension) 7.   Depression 8.   Gastric ulcer with hemorrhage 9.   Chronic renal failure 10.   Myasthenia gravis 11.   Community acquired pneumonia 12.   Acute renal failure 13.    DISPOSITION AND FOLLOW-UP: [name] is to follow-up with the listed providers as detailed below, at patient's visiting, please address following issues:  1. Please check his BMP to trend his renal function. 2. Repeat CXR in 6 weeks to assure complete resolution of his bronchitis vs. CAP  Follow-up Information   Follow up with Jonathan Meres, MD On 03/31/2012. (at 11 (gated code 9000))    Contact information:   [address] [place] [address] [phone]      Discharge Orders   Future Appointments Provider Department Dept Phone   03/31/2012 11:00 AM [facility] [phone]   Future Orders Complete By Expires     Call MD for:  difficulty breathing, headache or visual disturbances  As directed     Call MD for:  persistant dizziness or light-headedness  As directed     Call MD for:  temperature >100.4  As directed     Diet - low sodium heart healthy  As directed     Increase activity slowly  As directed         DISCHARGE MEDICATIONS:   Medication List    STOP taking these medications       carvedilol 6.25 MG tablet  Commonly known as:  COREG      TAKE these medications       azithromycin 500 MG tablet  Commonly known as:  ZITHROMAX  Take 1 tablet (500 mg total) by mouth daily.     diazepam 5 MG tablet    Commonly known as:  VALIUM  Take 5 mg by mouth 3 (three) times daily as needed for anxiety.     digoxin 0.125 MG tablet  Commonly known as:  LANOXIN  Take 0.125 mg by mouth daily.     hydrALAZINE 25 MG tablet  Commonly known as:  APRESOLINE  Take 0.5 tablets (12.5 mg total) by mouth every 8 (eight) hours.     hydrochlorothiazide 25 MG tablet  Commonly known as:  HYDRODIURIL  Take 1.5 tablets (37.5 mg total) by mouth 3 (three) times daily.     isosorbide mononitrate 60 MG 24 hr tablet  Commonly known as:  IMDUR  Take 60 mg by mouth daily.     mirtazapine 30 MG disintegrating tablet  Commonly known as:  REMERON SOL-TAB  Take 30 mg by mouth at bedtime.     omeprazole 20 MG capsule  Commonly known as:  PRILOSEC  Take 20 mg by mouth daily.     potassium chloride 10 MEQ tablet  Commonly known as:  K-DUR  Take 1 tablet (10 mEq total) by mouth daily.     rosuvastatin 10 MG tablet  Commonly known as:  CRESTOR  Take 10 mg by mouth daily.     torsemide 20 MG tablet  Commonly known as:  DEMADEX  Take 3 tablets (60 mg total) by mouth daily.         CONSULTS:  Treatment Team:  Jonathan Patty, MD    PROCEDURES PERFORMED:  Dg Chest 2 View  03/22/2012  *RADIOLOGY REPORT*  Clinical Data: Chest pain, shortness of breath.  CHEST - 2 VIEW  Comparison: 10/05/2011  Findings: Cardiomegaly.  Bilateral airspace opacities are noted, most pronounced in the perihilar regions, most compatible with moderate edema/CHF.  This is worsened since prior study.  Left AICD is unchanged.  No effusions.  No acute bony abnormality.  IMPRESSION: Moderate CHF.   Original Report Authenticated By: [name], M.D.        ADMISSION DATA: H&P:  History of Present Illness:  [name] is a 47 year-old [place] American male with a PMH of NICM with EF less than 15%, DM type II, CKD (BL cre 1.4 to 1.6) and hypertension, who presents with chest pain and progressively worsening shortness of breath.  .  Patient reports that he started having cough and coughing up brownish colored sputum since 3 days days ago. It is associated with fevers, chills, chest pain and body aches. His chest pain is pleuritic and is aggravated by coughing, not by exertion. patient has NICM. He is taking torsemide 60 mg daily. But his leg edema is getting worse and his BW increased by nearly 10 LBs. He reports that these symptoms have been getting worse over time and have required him to use 3 more pillows more than usual at night.  [name] states that his wife, with whom he has been in contact, has been sick with a cough/cold recently.  He states that he is very compliant with his medications and states that he is not forgotten any medications recently.  He has not been to a hospital in 6-7 months.  He endorses mild dizziness associated with his symptoms.  He does not use home oxygen but reports using the CPAP at night for sleep apnea.  Patient also reports recent exposure to chlamydia from his wife.  He states that he has had some penile discharge and pain with urination for the past 2 days.     Physical Exam:  General: Not in acute distress HEENT: PERRL, EOMI, no scleral icterus, it is difficulty to assess JVD due to body habitus, No bruit Cardiac: S1/S2, RRR, No murmurs, gallops or rubs Pulm: Slightly decreased air movement bilaterally. Crackles present at lung bases bilaterally, R>L,  No wheezing or rubs. Abd: Soft, nondistended, nontender, no rebound pain, no organomegaly, BS present Ext: 2+ pitting edema. 2+DP/PT pulse bilaterally Musculoskeletal: No joint deformities, erythema, or stiffness, ROM full Skin: No rashes.   Neuro: Alert and oriented X3, cranial nerves II-XII grossly intact, muscle strength 5/5 in all extremeties, sensation to light touch intact. Brachial reflex 2+ bilaterally. Knee reflex 1+ bilaterally. Negative Babinski's sign. Normal finger to [name] test. Psych: Patient is not psychotic, no  suicidal or hemocidal ideation    Lab results: Basic Metabolic Panel:  Recent Labs   03/22/12 0950   NA  137   K  3.7   CL  102   CO2  21   GLUCOSE  133*   BUN  18   CREATININE  1.62*   CALCIUM  8.8    Liver Function Tests:  Recent Labs   03/22/12 0950  AST  18   ALT  15   ALKPHOS  71   BILITOT  1.9*   PROT  6.9   ALBUMIN  3.1*    No results found for this basename: LIPASE, AMYLASE,  in the last 72 hours No results found for this basename: AMMONIA,  in the last 72 hours CBC:  Recent Labs   03/22/12 0950   WBC  13.5*   NEUTROABS  11.9*   HGB  14.7   HCT  42.5   MCV  85.2   PLT  232    Cardiac Enzymes:  Recent Labs   03/22/12 1633  03/22/12 2126   TROPONINI  <0.30  <0.30    BNP:  Recent Labs   03/22/12 0950   PROBNP  4469.0*    D-Dimer: No results found for this basename: DDIMER,  in the last 72 hours CBG:  Recent Labs   03/22/12 1626  03/22/12 2146   GLUCAP  164*  148*    Hemoglobin A1C: No results found for this basename: HGBA1C,  in the last 72 hours Fasting Lipid Panel: No results found for this basename: CHOL, HDL, LDLCALC, TRIG, CHOLHDL, LDLDIRECT,  in the last 72 hours Thyroid Function Tests: No results found for this basename: TSH, T4TOTAL, FREET4, T3FREE, THYROIDAB,  in the last 72 hours Anemia Panel: No results found for this basename: VITAMINB12, FOLATE, FERRITIN, TIBC, IRON, RETICCTPCT,  in the last 72 hours Coagulation: No results found for this basename: LABPROT, INR,  in the last 72 hours Urine Drug Screen: Drugs of Abuse      Component  Value  Date/Time     LABOPIA  NONE DETECTED  03/22/2012 1127     LABOPIA  NEG  02/01/2012 1356     COCAINSCRNUR  POSITIVE*  03/22/2012 1127     COCAINSCRNUR  PPS  02/01/2012 1356     LABBENZ  POSITIVE*  03/22/2012 1127     LABBENZ  PPS  02/01/2012 1356     LABBENZ  NEG  07/09/2010 1522     AMPHETMU  NONE DETECTED  03/22/2012 1127     AMPHETMU  NEG  07/09/2010 1522     THCU  POSITIVE*   03/22/2012 1127     LABBARB  NONE DETECTED  03/22/2012 1127     LABBARB  NEG  02/01/2012 1356    Alcohol Level: No results found for this basename: ETH,  in the last 72 hours Urinalysis:  Recent Labs   03/22/12 1127   COLORURINE  AMBER*   LABSPEC  1.026   PHURINE  5.5   GLUCOSEU  NEGATIVE   HGBUR  TRACE*   BILIRUBINUR  SMALL*   KETONESUR  15*   PROTEINUR  >300*   UROBILINOGEN  4.0*   NITRITE  NEGATIVE   LEUKOCYTESUR  NEGATIVE        HOSPITAL COURSE:  1. Acute on chronic systolic heart failure: Repeated EF was 15% with diffuse hypokinesis on this admission. The patient presented with 10 pounds of weight gain as compared to his dry weight of 211 coupled with shortness of breath and basilar crackles. He was started on IV 80 mg of furosemide bid and metalozone intimally. Heart failure team was consulted and treated him with Nesiritide and then with Milrinone. He responded to the treatment well. At time of discharge, the patient was euvolemic. He has an appointment on 03/31/12 with Dr. Clarise Cruz. Discharge medications are HCTZ 37.5 tid, imdur 60 daily, torsemide 60 daily, digoxin 0.125,  Kc 10 daily. ACEi and BB are being held per cardiology.  2. CAP: Patient presented with evidence of  Pneumonia vs. Bronchitis.  He was treated with azithromycin and IV ceftriaxone. He responded to the treatment well. His symptoms improved signigicantly over the course of his hospital stay. By time of discharge, CAP had resolved and the patient was demonstrating 98% oxygen saturations on room air. He was discharged on 2 more days of Azithromycin to complete of total of 7 days.  He needs to repeat his CXR in 6 weeks to assure complete resolution of his CAP vs. Bronchitis.   3. CKD: Creatinine remained stable throughout hospital stay. Discharge creatinine was 1.44, from 1.62 at admission.  4. DM type II: Remained well controlled on sliding scale insulin during hospital course.   5. Hypertension: Stable during  hospital course.  6. Penile Discharge: Patient reported recent exposure to trichomoniasis, gonorrhea, and chlamydia with symptoms of urethritis. He was treated emprically with azithromycin and ceftriaxone and gonorrhea and chlamydia tests were negative.  7. Peptic Ulcer Disease: Well controlled with protonix.   DISCHARGE DATA: Vital Signs: BP 117/74  Pulse 85  Temp(Src) 98.3 F (36.8 C) (Oral)  Resp 20  Ht 6' (1.829 m)  Wt 208 lb 12.4 oz (94.7 kg)  BMI 28.31 kg/m2  SpO2 95%  Labs: Results for orders placed during the hospital encounter of 03/22/12 (from the past 24 hour(s))  GLUCOSE, CAPILLARY     Status: Abnormal   Collection Time    03/26/12  5:06 PM      Result Value Range   Glucose-Capillary 204 (*) 70 - 99 mg/dL   Comment 1 Documented in Chart     Comment 2 Notify RN    URINALYSIS, ROUTINE W REFLEX MICROSCOPIC     Status: None   Collection Time    03/26/12  7:01 PM      Result Value Range   Color, Urine YELLOW  YELLOW   APPearance CLEAR  CLEAR   Specific Gravity, Urine 1.008  1.005 - 1.030   pH 5.5  5.0 - 8.0   Glucose, UA NEGATIVE  NEGATIVE mg/dL   Hgb urine dipstick NEGATIVE  NEGATIVE   Bilirubin Urine NEGATIVE  NEGATIVE   Ketones, ur NEGATIVE  NEGATIVE mg/dL   Protein, ur NEGATIVE  NEGATIVE mg/dL   Urobilinogen, UA 0.2  0.0 - 1.0 mg/dL   Nitrite NEGATIVE  NEGATIVE   Leukocytes, UA NEGATIVE  NEGATIVE  GLUCOSE, CAPILLARY     Status: Abnormal   Collection Time    03/26/12  9:05 PM      Result Value Range   Glucose-Capillary 192 (*) 70 - 99 mg/dL   Comment 1 Notify RN    BASIC METABOLIC PANEL     Status: Abnormal   Collection Time    03/27/12  5:00 AM      Result Value Range   Sodium 134 (*) 135 - 145 mEq/L   Potassium 4.2  3.5 - 5.1 mEq/L   Chloride 97  96 - 112 mEq/L   CO2 28  19 - 32 mEq/L   Glucose, Bld 196 (*) 70 - 99 mg/dL   BUN 23  6 - 23 mg/dL   Creatinine, Ser 4.54 (*) 0.50 - 1.35 mg/dL   Calcium 8.9  8.4 - 09.8 mg/dL   GFR calc non Af Amer 50  (*) >90 mL/min   GFR calc Af Amer 58 (*) >90 mL/min  GLUCOSE, CAPILLARY     Status: Abnormal  Collection Time    03/27/12  6:23 AM      Result Value Range   Glucose-Capillary 155 (*) 70 - 99 mg/dL   Comment 1 Notify RN    GLUCOSE, CAPILLARY     Status: Abnormal   Collection Time    03/27/12 11:41 AM      Result Value Range   Glucose-Capillary 146 (*) 70 - 99 mg/dL   Comment 1 Notify RN       Services Ordered on Discharge: Y = Yes; Blank = No PT:   OT:   RN:   Equipment:   Other:     Time Spent on Discharge: 38 min   Signed: Lorretta Harp, MD PGY 2, Internal Medicine Resident 03/27/2012, 1:48 PM

## 2012-03-27 NOTE — Progress Notes (Signed)
Entered Pt room to find he had already placed himself on CPAP for night.  No problems noted, pt resting.

## 2012-03-27 NOTE — Progress Notes (Signed)
Subjective:    [name] is a 47 y/o male with HTN and DM2 with longstanding HF due to NICM (probably ETOH), EF 15%. Cath in 2008 showed left circumflex with 40% stenosis. He has chronic pain and was diagnosed with myasthenia gravis in September 2012 requiring plasmapheresis exchange and mestonin. He was followed by [facility] but was discharged from the program. Admitted for recurrent HF and possible bronchitis.  Echo 03/23/12: EF 15%.  RV mod dilated and mild-mod reduced systolic function.  Mildly dilated LA and RA.  PAPP 45 mmHg.   Feels better. Weight stable on po demadex and off milrinone. Cr up slightly but still within baseline range.     Objective:   Weight Range:  Vital Signs:   Temp:  [98 F (36.7 C)-98.3 F (36.8 C)] 98.3 F (36.8 C) (03/10 0634) Pulse Rate:  [81-96] 81 (03/10 0634) Resp:  [18-20] 20 (03/10 0634) BP: (117-129)/(69-78) 117/77 mmHg (03/10 0634) SpO2:  [95 %-98 %] 95 % (03/10 0634) Weight:  [94.7 kg (208 lb 12.4 oz)] 94.7 kg (208 lb 12.4 oz) (03/10 0634) Last BM Date: 03/25/12  Weight change: Filed Weights   03/24/12 1834 03/25/12 0737 03/27/12 0634  Weight: 95.6 kg (210 lb 12.2 oz) 94.6 kg (208 lb 8.9 oz) 94.7 kg (208 lb 12.4 oz)    Intake/Output:   Intake/Output Summary (Last 24 hours) at 03/27/12 0810 Last data filed at 03/27/12 0643  Gross per 24 hour  Intake 1336.72 ml  Output   3450 ml  Net -2113.28 ml     Physical Exam: General: No resp difficulty  HEENT: normal  Neck: supple. JVP flat Cor: PMI laterally displaced. Regular rate and rhythm. 2/6 MR.  Lungs: clear .  Abdomen: soft, nontender, not distended Extremities: no edema  Neuro: alert & orientedx3, cranial nerves grossly intact. moves all 4 extremities w/o difficulty.    Telemetry: SR  Labs: Basic Metabolic Panel:  Recent Labs Lab 03/23/12 0226 03/24/12 0817 03/24/12 2321 03/25/12 0525 03/26/12 0709 03/27/12 0500  NA 136 137  --  135 135 134*  K 3.7 3.5   --  3.4* 4.1 4.2  CL 104 97  --  96 98 97  CO2 24 27  --  29 27 28   GLUCOSE 117* 128*  --  135* 150* 196*  BUN 20 24*  --  25* 22 23  CREATININE 1.69* 1.89*  --  1.71* 1.44* 1.63*  CALCIUM 8.2* 9.0  --  9.2 9.0 8.9  MG 1.8  --  2.1  --   --   --     Liver Function Tests:  Recent Labs Lab 03/22/12 0950 03/23/12 0226  AST 18 14  ALT 15 11  ALKPHOS 71 69  BILITOT 1.9* 1.1  PROT 6.9 6.3  ALBUMIN 3.1* 2.5*    CBC:  Recent Labs Lab 03/22/12 0950 03/26/12 0709  WBC 13.5* 5.4  NEUTROABS 11.9*  --   HGB 14.7 15.9  HCT 42.5 44.4  MCV 85.2 84.6  PLT 232 278    Cardiac Enzymes:  Recent Labs Lab 03/22/12 1633 03/22/12 2126 03/23/12 0225  TROPONINI <0.30 <0.30 <0.30    BNP: BNP (last 3 results)  Recent Labs  09/03/11 1502 10/05/11 1947 03/22/12 0950  PROBNP 3290.0* 5931.0* 4469.0*      Medications:     Scheduled Medications: . atorvastatin  10 mg Oral q1800  . azithromycin  500 mg Oral Q24H  . digoxin  0.125 mg Oral Daily  .  heparin  5,000 Units Subcutaneous Q8H  . hydrALAZINE  12.5 mg Oral Q8H  . insulin aspart  0-9 Units Subcutaneous TID WC  . isosorbide mononitrate  60 mg Oral Daily  . mirtazapine  30 mg Oral QHS  . pantoprazole  40 mg Oral Daily  . potassium chloride  10 mEq Oral Daily  . sodium chloride  3 mL Intravenous Q12H  . sodium chloride  3 mL Intravenous Q12H  . torsemide  60 mg Oral Daily    Infusions:    PRN Medications: sodium chloride, diazepam, ondansetron (ZOFRAN) IV, ondansetron, oxyCODONE, sodium chloride   Assessment:   1. Acute on chronic biventricular heart failure  2. NICM, EF 15%  3. ?CAP febrile with fever/chills  4. Chronic renal failure  5. Nonobstructive CAD  6. Polysubstance abuse  7. DM type 2  Plan/Discussion:    Looks much better. Euvolemic.   Can go home today on current regimen. Will arrange f/u on Friday to recheck weight and renal function.    Hold off on ACE and b-blocker due to low output  and renal failure. Titrate hydralazine/nitrates as BP tolerates.  Send home on previous home meds  hydralazine 37.5 tid  imdur 60 daily torsemide 60 daily digoxin 0.125 Kc 10 daily  We will try to add back carvedilol 3.125 bid in clinic on Friday.  Length of Stay: 5 Daniel Bensimhon,MD 8:10 AM

## 2012-03-27 NOTE — Progress Notes (Signed)
IV d/c'd.  Tele d/c'd.  Pt d/c'd to home.  Home meds and d/c instructions have been reviewed with pt.  Pt denies any questions or concerns at this time.  Pt leaving accompanied by staff and appears in no acute distress. Nino Glow RN

## 2012-03-28 LAB — CULTURE, BLOOD (ROUTINE X 2): Culture: NO GROWTH

## 2012-03-31 ENCOUNTER — Ambulatory Visit (HOSPITAL_COMMUNITY): Admit: 2012-03-31 | Payer: Medicare Other

## 2012-04-04 ENCOUNTER — Ambulatory Visit (HOSPITAL_COMMUNITY): Payer: Medicare Other

## 2012-04-10 ENCOUNTER — Encounter: Payer: Self-pay | Admitting: Licensed Clinical Social Worker
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
## 2012-04-11 ENCOUNTER — Telehealth: Payer: Self-pay | Admitting: *Deleted

## 2012-04-11 NOTE — Telephone Encounter (Signed)
Received call from [place], RN with [facility] [phone] ( care management )  I returned the call but had to leave a message.

## 2012-04-11 NOTE — Telephone Encounter (Signed)
Received another call from [employer] with [facility] stating she has sent papers to be signed by Dr Loistine Chance. This is so pt can receive assistance with his child care expenses, he Social Worker needs papers filled out and fax to her.

## 2012-04-12 NOTE — Telephone Encounter (Signed)
This paperwork is in your box in the clinic !

## 2012-04-12 NOTE — Telephone Encounter (Signed)
I have not received it yet.

## 2012-04-13 ENCOUNTER — Encounter: Payer: Self-pay | Admitting: Licensed Clinical Social Worker

## 2012-04-13 NOTE — Progress Notes (Signed)
Patient ID: EWARD RUTIGLIANO, male   DOB: 04/03/1965, 47 y.o.   MRN: [id number] [facility] note for RN care manager, Wynonia Lawman RN: Home visit made to Glendon Axe on 04-11-12. Patient had not checked his blood sugars. He has not been weighing himself. He is non adherent to taking his medications. All of these things he had agreed to do at our last visit of 03-07-12.  He did not get his post d/c Rx filled and he missed his doctor's appointment. He had various excuses for not being involved in his care. There were times during my visit that he became angry and loud in his responses.  Therefore, I am closing this case due to patient's refusal to follow standards of care guidelines and not complying with lifestyles practices which have been recommended.  I am sending a letter to [facility].  If you have any questions please call me.  Thank you.

## 2012-05-10 ENCOUNTER — Ambulatory Visit (INDEPENDENT_AMBULATORY_CARE_PROVIDER_SITE_OTHER): Payer: Medicare Other | Admitting: Radiation Oncology

## 2012-05-10 ENCOUNTER — Telehealth (HOSPITAL_COMMUNITY): Payer: Self-pay | Admitting: Cardiology

## 2012-05-10 ENCOUNTER — Other Ambulatory Visit (HOSPITAL_COMMUNITY): Payer: Self-pay

## 2012-05-10 VITALS — BP 117/76 | HR 92 | Temp 97.4°F | Ht 72.0 in | Wt 230.4 lb

## 2012-05-10 DIAGNOSIS — I5022 Chronic systolic (congestive) heart failure: Secondary | ICD-10-CM

## 2012-05-10 DIAGNOSIS — J189 Pneumonia, unspecified organism: Secondary | ICD-10-CM

## 2012-05-10 DIAGNOSIS — F329 Major depressive disorder, single episode, unspecified: Secondary | ICD-10-CM

## 2012-05-10 DIAGNOSIS — E119 Type 2 diabetes mellitus without complications: Secondary | ICD-10-CM

## 2012-05-10 LAB — BASIC METABOLIC PANEL
BUN: 24 mg/dL — ABNORMAL HIGH (ref 6–23)
Calcium: 8.6 mg/dL (ref 8.4–10.5)
Creat: 1.72 mg/dL — ABNORMAL HIGH (ref 0.50–1.35)
Glucose, Bld: 245 mg/dL — ABNORMAL HIGH (ref 70–99)
Potassium: 4.4 mEq/L (ref 3.5–5.3)

## 2012-05-10 LAB — POCT GLYCOSYLATED HEMOGLOBIN (HGB A1C): Hemoglobin A1C: 7.3

## 2012-05-10 NOTE — Patient Instructions (Signed)
We will call you with the results of your lab work and X-ray if there are any abnormalities.   Please keep your appointment with Dr. Clarise Cruz on 05/19/2012.  Contact us or go to the emergency room if you develop new or worsening symptoms.

## 2012-05-10 NOTE — Assessment & Plan Note (Addendum)
Pt admits to multiple life stressors including taking care of his 3 children and his chronic medical conditions. States he feels hopeless. States valium is the only medication that helps with the issue, which has been prescribed by Dr. Donell Beers in the past. Denies SI/HI.  - cont management per Dr. Donell Beers (appt made today for 05/19/2012)

## 2012-05-10 NOTE — Assessment & Plan Note (Signed)
Patient's signs/symptoms consistent with worsened CHF. Weight is increased from 208 => 230 pounds over the last 6 weeks. As he missed his previous appointment with the heart failure clinic, his next visit was to be on 05/19/2012 however after considerable urging he was talked into going to the heart failure clinic today to pick up samples of diuretics as they were informed of his presentation to the [facility] today and will be increasing his diuretic regimen in response. Unfortunately, the patient stated he had urgent issues to attend to and did not have time to be seen in a full clinic visit there today and was only agreeable to picking up the medications.  - [facility] - diuresis per HF team (unclear as to what agents and doses/frequencies will be prescribed there today as it will be dependent on sample availability)

## 2012-05-10 NOTE — Assessment & Plan Note (Signed)
No clinical evidence of recurrence.  - CXR to assess for resolution

## 2012-05-10 NOTE — Telephone Encounter (Signed)
Staff Zella Ball), with Outpatient Clinic called to request a work in appointment. Pt was being seen and had a 20 lb pound weight gain. Pt states he has been taking meds daily.  Pt was offered a appointment with CHF clinic 05/10/12 @ 1115. Pt states he is unable to come to this appointment as he needs to go check on his wife. Pt was then offered additional medications to help with weight increase. Pt unable to afford meds via the pharmacy. We are able to give pt a sample of Metolazone 5mg , one po x 3 days. Per vo Laurell Roof And keep appointment that is already scheduled on 05/19/12 @ 1045. Pt should call office if he is getting worse
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
## 2012-05-10 NOTE — Telephone Encounter (Signed)
Patient walked to clinic from outpatient clinic after seeing Dr. Lavena Bullion ([facility] Drawn).  He is in a rush today due to family issues.  I have offered him work in appointment now but he says can not stay for appointment but I quickly evaluated patient and lower extremity with 1+ edema and +abdominal distention.  He says he is taking his medication but does not have any money to pick up extra medication.  Will given metolazone 5 mg for 3 days, continue torsemide.  I have voiced importance of follow up appointment as he has cancelled all appointments for this year to date.  I have also told him he may need hospitalization if weight is not improving, he adamantly refuses.  He says he will make it to his appointment next week.  Will check [facility] at that time.

## 2012-05-11 ENCOUNTER — Telehealth: Payer: Self-pay | Admitting: Internal Medicine

## 2012-05-11 NOTE — Telephone Encounter (Signed)
Spoke w/pt he states Arlina Robes is his sister and he request that we NOT release any of his information to anyone, left VM for Nichole that we are not able to release any info to her

## 2012-05-11 NOTE — Telephone Encounter (Signed)
New Prob     Pt was seen in 2010 by Dr. Ladona Ridgel. Joni Reining states pt put her in his will for his care. She has questions regarding hospice and claims our office referred pt to hospice. Please call.

## 2012-05-11 NOTE — Telephone Encounter (Signed)
New Prob      Pt was seen in 2010

## 2012-05-16 ENCOUNTER — Telehealth (HOSPITAL_COMMUNITY): Payer: Self-pay | Admitting: *Deleted

## 2012-05-16 NOTE — Telephone Encounter (Addendum)
Patient left multiple calls today requesting update on Valium refill. Contacted pt [phone].Informed pt that message left with provided this afternoon.Also informed pt that no refill requests received in this office in past few days.Patient states he has been out of medicine a while and really needs it. Informed pt that provider will be in office tomorrow at 1300 and will ask again.

## 2012-05-16 NOTE — Telephone Encounter (Signed)
Patient left VM 4/25 @ 1519:VM recv'd 4/29 0827:Prescription for Valium ran out several days ago.Pharmacy has sent refill requests.Needs prescription. Last prescribed 12/01/11, date of last appt.Next appt 05/19/12. Left message for Dr.PLovsky at [phone] with above information 4/29 @ 1601

## 2012-05-17 MED ORDER — DIAZEPAM 5 MG PO TABS: 15.0000 mg | ORAL_TABLET | Freq: Three times a day (TID) | ORAL | Status: DC | PRN

## 2012-05-17 NOTE — Addendum Note (Signed)
Addended by: Archer Asa on: 05/17/2012 01:31 PM   Modules accepted: Orders

## 2012-05-19 ENCOUNTER — Encounter (HOSPITAL_COMMUNITY): Payer: Self-pay

## 2012-05-19 ENCOUNTER — Ambulatory Visit (INDEPENDENT_AMBULATORY_CARE_PROVIDER_SITE_OTHER): Payer: Medicare Other | Admitting: Psychiatry

## 2012-05-19 ENCOUNTER — Ambulatory Visit (HOSPITAL_COMMUNITY)
Admission: RE | Admit: 2012-05-19 | Discharge: 2012-05-19 | Disposition: A | Discharge status: Home / Self Care | Payer: Medicare Other | Source: Ambulatory Visit | Attending: Internal Medicine | Admitting: Internal Medicine

## 2012-05-19 VITALS — BP 108/96 | HR 100 | Wt 224.4 lb

## 2012-05-19 DIAGNOSIS — F4323 Adjustment disorder with mixed anxiety and depressed mood: Secondary | ICD-10-CM

## 2012-05-19 DIAGNOSIS — I5022 Chronic systolic (congestive) heart failure: Secondary | ICD-10-CM | POA: Insufficient documentation

## 2012-05-19 MED ORDER — CARVEDILOL 3.125 MG PO TABS: 3.1250 mg | ORAL_TABLET | Freq: Two times a day (BID) | ORAL | Status: DC

## 2012-05-19 MED ORDER — CLORAZEPATE DIPOTASSIUM 7.5 MG PO TABS: 22.5000 mg | ORAL_TABLET | Freq: Three times a day (TID) | ORAL | Status: DC

## 2012-05-19 MED ORDER — HYDROXYZINE HCL 25 MG PO TABS: 200.0000 mg | ORAL_TABLET | Freq: Three times a day (TID) | ORAL | Status: DC | PRN

## 2012-05-19 NOTE — Assessment & Plan Note (Signed)
Continues with advanced HF symptome. Volume status up about 8-10 pounds. (improved with recent metoalzone dosing). Reinforced need for daily weights and reviewed use of sliding scale diuretics. Will give metolazone 2.5 mg daily for 2 days. Can use as needed after that. Try to protect weight around 215. Will try to start carvedilol back at 3.125 bid. Also will check with [facility] if they can see him again. Check labs today. Will see back in 1 month. Eventually would like to consider getting him back on hydralazine/nitrates.

## 2012-05-19 NOTE — Addendum Note (Signed)
Encounter addended by: Noralee Space, RN on: 05/19/2012 11:26 AM<BR>     Documentation filed: Patient Instructions Section, Orders

## 2012-05-19 NOTE — Progress Notes (Signed)
[facility] MD Progress Note  05/19/2012 9:46 AM [name]  MRN:  [id number] Subjective: fatigued Diagnosis:  Axis I: Adjustment Disorder with Mixed Emotional Features This patient is seen alone. He's not been seen in this office for over 6 months. He was treated for adjustment disorder with anxious mood state and clinical depression. The Remeron he was prescribed oversedated him and he wanted to be awake and alert for his 45-year-old daughter.the patient was treated with Valium 5 mg 3 times a day which is only mildly beneficial. The patient ended up taking just a little bit more than prescribed but ultimately ran out of the Valium because he could not get back here. He missed his last appointment because he was hospitalized. This patient is a severe heart disease and is recently come out of hospice. The patient says physically he is doing much better. He is breathing well and has no chest pain. The patient says he feels tense and anxious and is having great difficulty going to sleep. He takes him to 1:00 does go to sleep and he wakes at 5:00 to get people/kids up to go to school. He is hardly any contact with his wife whose crack dependent and lives in the community. The patient has an 45 year old or a 58-year-old her and her 32-year-old  who he cares for by himself.the patient has Medicare but is not on Medicaid. I believe this patient likely is a candidate to get Medicaid. The patient is on food stamps. The patient is clearly stressed by many different factors mainly related to his wife will not clean up enough to come home. The patient is very resistant to going to [facility] for care because he was treated so poorly in that it took so long for them to see him when he came to the Center. The patient denies the use of alcohol or drugs at this time. He denies symptoms of psychosis. He denies being suicidal or homicidal. ADL's:  Intact  Sleep: Good  Appetite:  Fair  Suicidal Ideation:  no Homicidal Ideation:   no AEB (as evidenced by):  Psychiatric Specialty Exam: ROS  There were no vitals taken for this visit.There is no weight on file to calculate BMI.  General Appearance: Casual  Eye Contact::  Good  Speech:  Clear and Coherent  Volume:  Normal  Mood:  Depressed  Affect:  Depressed  Thought Process:  Goal Directed  Orientation:  Full (Time, Place, and Person)  Thought Content:  WDL  Suicidal Thoughts:  No  Homicidal Thoughts:  No  Memory:  good  Judgement:  Good  Insight:  Good  Psychomotor Activity:  Normal  Concentration:  Good  Recall:  Good  Akathisia:  No  Handed:  Right  AIMS (if indicated):     Assets:  Desire for Improvement  Sleep:      Current Medications: Current Outpatient Prescriptions  Medication Sig Dispense Refill  . azithromycin (ZITHROMAX) 500 MG tablet Take 1 tablet (500 mg total) by mouth daily.  2 tablet  0  . clorazepate (TRANXENE) 7.5 MG tablet Take 3 tablets (22.5 mg total) by mouth 3 (three) times daily. 1 qam 2 qpm  90 tablet  1  . digoxin (LANOXIN) 0.125 MG tablet Take 0.125 mg by mouth daily.      . hydrALAZINE (APRESOLINE) 25 MG tablet Take 0.5 tablets (12.5 mg total) by mouth every 8 (eight) hours.  30 tablet  0  . hydrochlorothiazide (HYDRODIURIL) 25 MG tablet Take 1.5 tablets (  37.5 mg total) by mouth 3 (three) times daily.  150 tablet  0  . hydrOXYzine (ATARAX/VISTARIL) 25 MG tablet Take 8 tablets (200 mg total) by mouth 3 (three) times daily as needed for itching. 1 or 2 TID prn  180 tablet  3  . isosorbide mononitrate (IMDUR) 60 MG 24 hr tablet Take 60 mg by mouth daily.      Marland Kitchen omeprazole (PRILOSEC) 20 MG capsule Take 20 mg by mouth daily.      . potassium chloride (K-DUR) 10 MEQ tablet Take 1 tablet (10 mEq total) by mouth daily.  30 tablet  0  . rosuvastatin (CRESTOR) 10 MG tablet Take 10 mg by mouth daily.      Marland Kitchen torsemide (DEMADEX) 20 MG tablet Take 3 tablets (60 mg total) by mouth daily.  90 tablet  0  . [DISCONTINUED] metoprolol  tartrate (LOPRESSOR) 25 MG tablet Take 1 tablet (25 mg total) by mouth 2 (two) times daily.  60 tablet  1  . [DISCONTINUED] traZODone (DESYREL) 25 mg TABS Take 0.5 tablets (25 mg total) by mouth at bedtime.  30 tablet  1   No current facility-administered medications for this visit.    Lab Results: No results found for this or any previous visit (from the past 48 hour(s)).  Physical Findings: AIMS:  , ,  ,  ,    CIWA:    COWS:     Treatment Plan Summary: At this time we will discontinue officially his Remeron and his Valium. We will begin him on treatment seen 7.5 mg pills. He was instructed to take 1 twice a day for couple days and then to increase it to the full dose of one in the morning and 2 at night. I asked him not to take any extra Tranxene but to take it on a fixed dose, 3 a day. We also started him on Vistaril 25 mg and told him to take 1 or 2 3 times a day when necessary. And I instructed him not to take any more than 6.ideally my recommendations to this patient was to get a counselor or therapist. His claim is that taking care of 3 kids and trying to speak to his wife keeps him so busy he can't find time to see a therapist but I am not sure one would be available for him anyway. Ideally I would like somebody from DSS to evaluate if he is getting maximum financial support. We will rediscuss this at her next visit in 6 weeks. Nonetheless my goal is for him to take a fixed dose of Tranxene and when necessary Vistaril as needed.  Plan:  Medical Decision Making Problem Points:  Established problem, worsening (2) Data Points:  Review of new medications or change in dosage (2)  I certify that inpatient services furnished can reasonably be expected to improve the patient's condition.   [name] 05/19/2012, 9:46 AM

## 2012-05-19 NOTE — Patient Instructions (Addendum)
Labs today  Start Carvedilol 3.125 mg Twice daily   Your physician recommends that you schedule a follow-up appointment in: 1 month

## 2012-05-19 NOTE — Progress Notes (Signed)
Patient ID: [name], male   DOB: August 09, 1965, 47 y.o.   MRN: [id number] Psychiatrist: Dr Anthony Sar PCP: Dr Loistine Chance  HPI: [name] is a 47 y/o male with HTN and DM2 with longstanding HF due to NICM (probably ETOH), EF 20%. Cath in 2008 showed left circumflex with 40% stenosis.   He has had multiple admissions for HF over the past year.   Admitted 03/2011 with NYHA Class IV symptoms, requiring milrinone therapy and IV lasix. Cath 04/13/11: cardiogenic shock with biventricular failure (R>L) and possible restrictive physiology. RA = 22 with steep y-descents, RV = 45/12/25, PA = 51/25 (34), PCW = 18-20, Fick cardiac output/index = 3.1/1.4, Thermo CO/CI = 3.6/1.6, PVR = 4.8 Woods O2 sat = 85% (after sedation), PA sat = 31%, 36%. Milrinone was initiated for low output physiology.   Baseline weight about 210. Was previously on [facility] but this ended in December in 2013 as he was doing well.   [name] stopped due to hyperkalemia. Beanzepril and lopressor stopped due to hypotension.  Was seen by PCP on 4/23 weight was 231 pounds. He stopped by HF clinic and was given metolazone x 3 days. Weight down to 224. Still with some edema. Continue with Class III-IIIb dyspnea. Still with orthopnea. Living at home with his 3 kids. No longer followed by [facility]. Has scale at home but not weighting every day.   Labs on 4/23. Cr 1.7 (stable) k = 4.4  ROS: All systems negative except as listed in HPI, PMH and Problem List.  Past Medical History  Diagnosis Date  . Hypertension   . 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 History of peptic ulcer disease     EGD 09/2007 shows prox gastric ulcer with black eschar, treated with PPI  . Depression   . History of alcohol abuse     quit approx 2011-2012  . Angina   . Shortness of breath   . OSA (obstructive sleep apnea)     sleeps with oxygen  . Cardiomyopathy    . ICD (implantable cardiac defibrillator) in place   . Diabetes mellitus     insulin dependent  . GERD (gastroesophageal reflux disease)   . Headache   . Myasthenia gravis 10/12/2011    Diagnosed in 09/2011. Received Plasmapheresis. CT chest no Thymoma. Acetylcholine receptor antibody negative.    . CKD (chronic kidney disease) stage 3, GFR 30-59 ml/min   . Myasthenia gravis   . Myasthenia gravis   . Tuberculosis     Current Outpatient Prescriptions  Medication Sig Dispense Refill  . clorazepate (TRANXENE) 7.5 MG tablet Take 3 tablets (22.5 mg total) by mouth 3 (three) times daily. 1 qam 2 qpm  90 tablet  1  . digoxin (LANOXIN) 0.125 MG tablet Take 0.125 mg by mouth daily.      . hydrochlorothiazide (HYDRODIURIL) 25 MG tablet Take 1.5 tablets (37.5 mg total) by mouth 3 (three) times daily.  150 tablet  0  . isosorbide mononitrate (IMDUR) 60 MG 24 hr tablet Take 60 mg by mouth daily.      Marland Kitchen omeprazole (PRILOSEC) 20 MG capsule Take 20 mg by mouth daily.      . potassium chloride (K-DUR) 10 MEQ tablet Take 1 tablet (10 mEq total) by mouth daily.  30 tablet  0  . rosuvastatin (CRESTOR) 10 MG tablet Take 10 mg by mouth daily.      Marland Kitchen  torsemide (DEMADEX) 20 MG tablet Take 3 tablets (60 mg total) by mouth daily.  90 tablet  0  . [DISCONTINUED] metoprolol tartrate (LOPRESSOR) 25 MG tablet Take 1 tablet (25 mg total) by mouth 2 (two) times daily.  60 tablet  1  . [DISCONTINUED] traZODone (DESYREL) 25 mg TABS Take 0.5 tablets (25 mg total) by mouth at bedtime.  30 tablet  1   No current facility-administered medications for this encounter.     PHYSICAL EXAM: Filed Vitals:   05/19/12 1057  BP: 108/96  Pulse: 100  Weight: 224 lb 6.4 oz (101.787 kg)  SpO2: 100%   224  General:  Well developed.  No resp difficulty HEENT: normal Neck: supple. JVP 8 Carotids 2+ bilaterally; no bruits. No lymphadenopathy or thryomegaly appreciated. Cor: PMI normal. Regular rate & rhythm. 2/6 MR Lungs:  clear Abdomen: soft, nontender, nondistended. No hepatosplenomegaly. No bruits or masses. Good bowel sounds. Extremities: no cyanosis, clubbing, rash, 1+ edema Neuro: alert & orientedx3, cranial nerves grossly intact. Moves all 4 extremities w/o difficulty. Affect pleasant.     ASSESSMENT & PLAN:

## 2012-05-22 NOTE — Progress Notes (Signed)
INTERNAL MEDICINE TEACHING ATTENDING ADDENDUM - Inez Catalina, MD: I reviewed with the resident Dr. Lavena Bullion, Mr. Bushway's  medical history, physical examination, diagnosis and results of tests and treatment and I agree with the patient's care as documented.

## 2012-05-23 ENCOUNTER — Encounter: Payer: Self-pay | Admitting: Internal Medicine

## 2012-06-16 ENCOUNTER — Emergency Department (HOSPITAL_COMMUNITY)
Admission: EM | Admit: 2012-06-16 | Discharge: 2012-06-18 | Disposition: A | Discharge status: Other Acute Inpatient Hospital | Payer: Medicare Other | Source: Home / Self Care | Attending: Emergency Medicine | Admitting: Emergency Medicine

## 2012-06-16 ENCOUNTER — Encounter (HOSPITAL_COMMUNITY): Payer: Self-pay | Admitting: Emergency Medicine

## 2012-06-16 ENCOUNTER — Emergency Department (HOSPITAL_COMMUNITY): Payer: Medicare Other

## 2012-06-16 DIAGNOSIS — I5022 Chronic systolic (congestive) heart failure: Secondary | ICD-10-CM

## 2012-06-16 DIAGNOSIS — E119 Type 2 diabetes mellitus without complications: Secondary | ICD-10-CM

## 2012-06-16 DIAGNOSIS — R45851 Suicidal ideations: Secondary | ICD-10-CM

## 2012-06-16 DIAGNOSIS — I10 Essential (primary) hypertension: Secondary | ICD-10-CM

## 2012-06-16 LAB — TROPONIN I: Troponin I: <0.3 ng/mL (ref <0.30)

## 2012-06-16 LAB — BASIC METABOLIC PANEL
BUN: 23 mg/dL (ref 6–23)
Chloride: 98 mEq/L (ref 96–112)
Creatinine, Ser: 1.87 mg/dL — ABNORMAL HIGH (ref 0.50–1.35)
GFR calc Af Amer: 49 mL/min — ABNORMAL LOW (ref >90)
GFR calc non Af Amer: 42 mL/min — ABNORMAL LOW (ref >90)
Potassium: 3.8 mEq/L (ref 3.5–5.1)

## 2012-06-16 LAB — GLUCOSE, CAPILLARY: Glucose-Capillary: 152 mg/dL — ABNORMAL HIGH (ref 70–99)

## 2012-06-16 LAB — URINE MICROSCOPIC-ADD ON

## 2012-06-16 LAB — URINALYSIS, ROUTINE W REFLEX MICROSCOPIC
Ketones, ur: 15 mg/dL — AB
Leukocytes, UA: NEGATIVE
Nitrite: NEGATIVE
Protein, ur: >300 mg/dL — AB
Urobilinogen, UA: 1 mg/dL (ref 0.0–1.0)

## 2012-06-16 LAB — CBC
HCT: 43 % (ref 39.0–52.0)
MCHC: 33.5 g/dL (ref 30.0–36.0)
Platelets: 306 10*3/uL (ref 150–400)
RDW: 14.8 % (ref 11.5–15.5)
WBC: 7.2 10*3/uL (ref 4.0–10.5)

## 2012-06-16 LAB — ACETAMINOPHEN LEVEL: Acetaminophen (Tylenol), Serum: <15 ug/mL (ref 10–30)

## 2012-06-16 LAB — PRO B NATRIURETIC PEPTIDE: Pro B Natriuretic peptide (BNP): 4313 pg/mL — ABNORMAL HIGH (ref 0–125)

## 2012-06-16 MED ORDER — ONDANSETRON HCL 4 MG PO TABS: 4.0000 mg | ORAL_TABLET | Freq: Three times a day (TID) | ORAL | Status: DC | PRN

## 2012-06-16 MED ORDER — HYDROCHLOROTHIAZIDE 25 MG PO TABS: 12.5000 mg | ORAL_TABLET | Freq: Two times a day (BID) | ORAL | Status: DC

## 2012-06-16 MED ORDER — IBUPROFEN 400 MG PO TABS: 600.0000 mg | ORAL_TABLET | Freq: Three times a day (TID) | ORAL | Status: DC | PRN

## 2012-06-16 MED ORDER — HYDROXYZINE HCL 25 MG PO TABS
25.0000 mg | ORAL_TABLET | Freq: Three times a day (TID) | ORAL | Status: DC | PRN
  Filled 2012-06-16: qty 1

## 2012-06-16 MED ORDER — ATORVASTATIN CALCIUM 20 MG PO TABS
20.0000 mg | ORAL_TABLET | Freq: Every day | ORAL | Status: DC
  Administered 2012-06-17: 20 mg via ORAL
  Filled 2012-06-16 (×3): qty 1

## 2012-06-16 MED ORDER — CARVEDILOL 3.125 MG PO TABS: 3.1250 mg | ORAL_TABLET | Freq: Two times a day (BID) | ORAL | Status: DC

## 2012-06-16 MED ORDER — DIGOXIN 125 MCG PO TABS
0.1250 mg | ORAL_TABLET | Freq: Every day | ORAL | Status: DC
  Administered 2012-06-17 – 2012-06-18 (×2): 0.125 mg via ORAL
  Filled 2012-06-16 (×2): qty 1

## 2012-06-16 MED ORDER — ISOSORBIDE MONONITRATE ER 60 MG PO TB24
60.0000 mg | ORAL_TABLET | Freq: Every day | ORAL | Status: DC
  Administered 2012-06-17 – 2012-06-18 (×2): 60 mg via ORAL
  Filled 2012-06-16 (×2): qty 1

## 2012-06-16 MED ORDER — ALUM & MAG HYDROXIDE-SIMETH 200-200-20 MG/5ML PO SUSP: 30.0000 mL | ORAL | Status: DC | PRN

## 2012-06-16 MED ORDER — POTASSIUM CHLORIDE ER 10 MEQ PO TBCR
10.0000 meq | EXTENDED_RELEASE_TABLET | Freq: Every day | ORAL | Status: DC
  Administered 2012-06-17 – 2012-06-18 (×2): 10 meq via ORAL
  Filled 2012-06-16 (×2): qty 1

## 2012-06-16 MED ORDER — FUROSEMIDE 10 MG/ML IJ SOLN: 80.0000 mg | Freq: Once | INTRAMUSCULAR | Status: DC

## 2012-06-16 MED ORDER — IBUPROFEN 400 MG PO TABS: 400.0000 mg | ORAL_TABLET | ORAL | Status: AC

## 2012-06-16 MED ORDER — PANTOPRAZOLE SODIUM 40 MG PO TBEC
40.0000 mg | DELAYED_RELEASE_TABLET | Freq: Every day | ORAL | Status: DC
  Administered 2012-06-17 – 2012-06-18 (×2): 40 mg via ORAL
  Filled 2012-06-16 (×2): qty 1

## 2012-06-16 MED ORDER — CLORAZEPATE DIPOTASSIUM 7.5 MG PO TABS: 7.5000 mg | ORAL_TABLET | Freq: Every day | ORAL | Status: DC

## 2012-06-16 MED ORDER — NICOTINE 21 MG/24HR TD PT24
21.0000 mg | MEDICATED_PATCH | Freq: Every day | TRANSDERMAL | Status: DC
  Filled 2012-06-16 (×2): qty 1

## 2012-06-16 MED ORDER — ZOLPIDEM TARTRATE 5 MG PO TABS: 5.0000 mg | ORAL_TABLET | Freq: Every evening | ORAL | Status: DC | PRN

## 2012-06-16 MED ORDER — CARVEDILOL 3.125 MG PO TABS
3.1250 mg | ORAL_TABLET | Freq: Two times a day (BID) | ORAL | Status: DC
  Administered 2012-06-17 – 2012-06-18 (×3): 3.125 mg via ORAL
  Filled 2012-06-16 (×7): qty 1

## 2012-06-16 MED ORDER — TORSEMIDE 20 MG PO TABS: 20.0000 mg | ORAL_TABLET | Freq: Every day | ORAL | Status: DC

## 2012-06-16 MED ORDER — HYDRALAZINE HCL 25 MG PO TABS
25.0000 mg | ORAL_TABLET | Freq: Three times a day (TID) | ORAL | Status: DC
  Administered 2012-06-17 – 2012-06-18 (×4): 25 mg via ORAL
  Filled 2012-06-16 (×9): qty 1

## 2012-06-16 MED ORDER — MIRTAZAPINE 30 MG PO TABS
30.0000 mg | ORAL_TABLET | Freq: Every day | ORAL | Status: DC
  Administered 2012-06-17: 30 mg via ORAL
  Filled 2012-06-16 (×4): qty 1

## 2012-06-16 MED ORDER — TORSEMIDE 20 MG PO TABS
60.0000 mg | ORAL_TABLET | Freq: Every day | ORAL | Status: DC
  Administered 2012-06-17 – 2012-06-18 (×2): 60 mg via ORAL
  Filled 2012-06-16 (×2): qty 3

## 2012-06-16 MED ORDER — LORAZEPAM 1 MG PO TABS
1.0000 mg | ORAL_TABLET | Freq: Three times a day (TID) | ORAL | Status: DC | PRN
Start: 2012-06-16 — End: 2012-06-18

## 2012-06-16 MED ORDER — CLORAZEPATE DIPOTASSIUM 7.5 MG PO TABS
15.0000 mg | ORAL_TABLET | Freq: Every day | ORAL | Status: DC
  Filled 2012-06-16: qty 2

## 2012-06-16 NOTE — ED Provider Notes (Addendum)
47 year old male with history of congestive heart failure has been having difficulty breathing for the last month. It is getting worse. Difficulty breathing is worse with exertion and with lying flat. He has noted some weight gain. He is supposed to check his weight daily but admits that he does not do that. He states that his dose of torsemide had been reduced from 80 mg a day to 60 mg a day. He also notices some heavy feeling in his chest. On exam, he has neck vein distention noted at 90. Breath sounds are diminished throughout but no overt rales are heard. Heart has regular rate and rhythm. There is 1+ presacral edema and 1+ pretibial edema. Overall, the picture seems most consistent with exacerbation of CHF. BNP is pending but I anticipate giving him a dose of furosemide in the ED and increasing his home dose of torsemide. He does have a followup scheduled with the heart failure clinic in the next week.   Date: 06/16/2012  Rate: 100  Rhythm: normal sinus rhythm  QRS Axis: normal  Intervals: QT prolonged  ST/T Wave abnormalities: nonspecific ST/T changes  Conduction Disutrbances:none  Narrative Interpretation: Nonspecific T wave changes which are possibly related to left ventricular hypertrophy. Prolonged QT interval. When compared with ECG of 03/22/2012, QT has lengthened.  Old EKG Reviewed: unchanged  Medical screening examination/treatment/procedure(s) were performed by non-physician practitioner and as supervising physician I was immediately available for consultation/collaboration.  Dione Booze, MD 06/16/12 1638  Dione Booze, MD 06/16/12 [id number]

## 2012-06-16 NOTE — ED Notes (Signed)
Cough and cold s/s x 3 weeks was coughing up green stuff but not now head feels full and his chest hurts when he coughs

## 2012-06-16 NOTE — ED Notes (Signed)
Press photographer and house coverage notified of  Pt and need for sitter.

## 2012-06-16 NOTE — ED Provider Notes (Signed)
History     CSN: [id number]  Arrival date & time 06/16/12  1451   First MD Initiated Contact with Patient 06/16/12 1529      Chief Complaint  Patient presents with  . URI    (Consider location/radiation/quality/duration/timing/severity/associated sxs/prior treatment) The history is provided by the patient and medical records. No language interpreter was used.    [name] is a 47 y.o. male  with a hx of CHF (EF 20-25% followed by Dr Gala Romney at [place]), HTN, cardiomyopathy, DM, CKD presents to the Emergency Department complaining of gradual, persistent, progressively worsening shortness of breath onset 3 weeks ago with associated chest pain, cough productive of green sputum, dysuria, hematuria, headache, general weakness, leg swelling, dyspnea on exertion, weight gain, chills and myalgias.  Laying flat and exertion makes the SOB worse.  Pt has tried ibuprofen and OTC cold medications without relief.  Pt denies fever, neck pain, rash, abdominal pain, N/V/D/, syncope.   Past Medical History  Diagnosis Date  . Hypertension   . 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 History of peptic ulcer disease     EGD 09/2007 shows prox gastric ulcer with black eschar, treated with PPI  . Depression   . History of alcohol abuse     quit approx 2011-2012  . Angina   . Shortness of breath   . OSA (obstructive sleep apnea)     sleeps with oxygen  . Cardiomyopathy   . ICD (implantable cardiac defibrillator) in place   . Diabetes mellitus     insulin dependent  . GERD (gastroesophageal reflux disease)   . Headache(784.0)   . Myasthenia gravis 10/12/2011    Diagnosed in 09/2011. Received Plasmapheresis. CT chest no Thymoma. Acetylcholine receptor antibody negative.    . CKD (chronic kidney disease) stage 3, GFR 30-59 ml/min   . Myasthenia gravis   . Myasthenia gravis   . Tuberculosis      Past Surgical History  Procedure Laterality Date  . Cardiac defibrillator placement  04/2007  . Knee surgery    . Skin grafts    . Facial reconstruction surgery    . Insertion of dialysis catheter  10/12/2011    temporary     Family History  Problem Relation Age of Onset  . Heart failure Mother   . Hypertension Mother   . Sarcoidosis Mother   . Diabetes type II Mother   . Heart attack Brother     History  Substance Use Topics  . Smoking status: Light Tobacco Smoker    Types: Cigars    Last Attempt to Quit: 07/27/2007  . Smokeless tobacco: Former Neurosurgeon  . Alcohol Use: No     Comment: no alcohol since 2012      Review of Systems  Constitutional: Positive for chills. Negative for fever, diaphoresis, appetite change, fatigue and unexpected weight change.  HENT: Negative for facial swelling, mouth sores, neck pain and neck stiffness.   Eyes: Negative for visual disturbance.  Respiratory: Positive for cough, chest tightness and shortness of breath. Negative for wheezing.   Cardiovascular: Positive for chest pain and leg swelling.  Gastrointestinal: Negative for nausea, vomiting, abdominal pain, diarrhea and constipation.  Endocrine: Negative for polydipsia, polyphagia and polyuria.  Genitourinary: Positive for dysuria and hematuria. Negative for urgency and frequency.  Musculoskeletal: Positive for myalgias. Negative for back pain.  Skin: Negative for rash.  Allergic/Immunologic:  Negative for immunocompromised state.  Neurological: Positive for weakness and headaches. Negative for syncope and light-headedness.  Hematological: Does not bruise/bleed easily.  Psychiatric/Behavioral: Negative for sleep disturbance. The patient is not nervous/anxious.     Allergies  Shellfish allergy; Adhesive; Tylenol; and Zoloft  Home Medications   Current Outpatient Rx  Name  Route  Sig  Dispense  Refill  . carvedilol (COREG) 3.125 MG tablet   Oral   Take 3.125 mg by mouth 2 (two)  times daily with a meal.         . clorazepate (TRANXENE) 7.5 MG tablet   Oral   Take 7.5 mg by mouth daily. Take three tablets by mouth three times a day (1 in the morning and 2 in the evening)         . digoxin (LANOXIN) 0.125 MG tablet   Oral   Take 0.125 mg by mouth daily.         . hydrALAZINE (APRESOLINE) 25 MG tablet   Oral   Take 25 mg by mouth 3 (three) times daily.         . hydrochlorothiazide (HYDRODIURIL) 25 MG tablet   Oral   Take 12.5-25 mg by mouth 3 (three) times daily.         . hydrOXYzine (ATARAX/VISTARIL) 25 MG tablet   Oral   Take 25-50 mg by mouth 3 (three) times daily as needed for itching. Take 1 or 2 tablets by mouth 3 times daily as needed for itching.(Max of 6 tablets a day)         . isosorbide mononitrate (IMDUR) 60 MG 24 hr tablet   Oral   Take 60 mg by mouth daily.         . mirtazapine (REMERON) 30 MG tablet   Oral   Take 30 mg by mouth at bedtime.         Marland Kitchen omeprazole (PRILOSEC) 20 MG capsule   Oral   Take 20 mg by mouth daily.         . potassium chloride (K-DUR) 10 MEQ tablet   Oral   Take 10 mEq by mouth daily.         . rosuvastatin (CRESTOR) 10 MG tablet   Oral   Take 10 mg by mouth daily.         Marland Kitchen torsemide (DEMADEX) 20 MG tablet   Oral   Take 20 mg by mouth 3 (three) times daily.           BP 127/80  Pulse 92  Temp(Src) 97.9 F (36.6 C) (Oral)  Resp 18  Ht 6' (1.829 m)  Wt 230 lb (104.327 kg)  BMI 31.19 kg/m2  SpO2 100%  Physical Exam  Nursing note and vitals reviewed. Constitutional: He is oriented to person, place, and time. He appears well-developed and well-nourished. No distress.  HENT:  Head: Normocephalic and atraumatic.  Right Ear: Tympanic membrane, external ear and ear canal normal.  Left Ear: Tympanic membrane, external ear and ear canal normal.  Nose: Nose normal. No mucosal edema or rhinorrhea.  Mouth/Throat: Uvula is midline, oropharynx is clear and moist and mucous  membranes are normal. Mucous membranes are not dry. No edematous. No oropharyngeal exudate, posterior oropharyngeal edema, posterior oropharyngeal erythema or tonsillar abscesses.  Eyes: Conjunctivae are normal. Pupils are equal, round, and reactive to light. No scleral icterus.  Neck: Normal range of motion. Neck supple.  Cardiovascular: Normal rate, regular rhythm, normal heart sounds and intact distal pulses.  No murmur heard. Pulses:      Radial pulses are 2+ on the right side, and 2+ on the left side.       Posterior tibial pulses are 2+ on the right side, and 2+ on the left side.  Capillary refill < 3 sec  Pulmonary/Chest: Effort normal. No respiratory distress. He has decreased breath sounds (throughout). He has no wheezes. He has no rhonchi. He has no rales. He exhibits tenderness (mild anterior chest tenderness to palpation).  Abdominal: Soft. Bowel sounds are normal. He exhibits no distension and no mass. There is no tenderness. There is no rebound and no guarding.  Musculoskeletal: Normal range of motion. He exhibits edema.  Mild 1+ non pitting edema extending from the midfoot to the ankles without decreased ROM or pain to palpation  Lymphadenopathy:    He has no cervical adenopathy.  Neurological: He is alert and oriented to person, place, and time. He exhibits normal muscle tone. Coordination normal. GCS eye subscore is 4. GCS verbal subscore is 5. GCS motor subscore is 6.  Speech is clear and goal oriented, follows commands Normal strength in upper and lower extremities bilaterally including dorsiflexion and plantar flexion, strong and equal grip strength Sensation normal to light and sharp touch Moves extremities without ataxia, coordination intact Normal gait and balance   Skin: Skin is warm and dry. He is not diaphoretic. No erythema.  Psychiatric: He has a normal mood and affect.    ED Course  Procedures (including critical care time)  Labs Reviewed  URINALYSIS,  ROUTINE W REFLEX MICROSCOPIC - Abnormal; Notable for the following:    Color, Urine AMBER (*)    Hgb urine dipstick MODERATE (*)    Bilirubin Urine SMALL (*)    Ketones, ur 15 (*)    Protein, ur >300 (*)    All other components within normal limits  BASIC METABOLIC PANEL - Abnormal; Notable for the following:    Sodium 134 (*)    Glucose, Bld 207 (*)    Creatinine, Ser 1.87 (*)    GFR calc non Af Amer 42 (*)    GFR calc Af Amer 49 (*)    All other components within normal limits  URINE MICROSCOPIC-ADD ON - Abnormal; Notable for the following:    Bacteria, UA FEW (*)    Casts HYALINE CASTS (*)    All other components within normal limits  PRO B NATRIURETIC PEPTIDE - Abnormal; Notable for the following:    Pro B Natriuretic peptide (BNP) 4313.0 (*)    All other components within normal limits  URINE CULTURE  CBC  TROPONIN I  ACETAMINOPHEN LEVEL  ETHANOL  SALICYLATE LEVEL  URINE RAPID DRUG SCREEN (HOSP PERFORMED)   Dg Chest 2 View  06/16/2012   *RADIOLOGY REPORT*  Clinical Data: Cough, chest congestion, generalized weakness, 3- week history of these symptoms.  CHEST - 2 VIEW  Comparison: Two-view chest x-ray 03/22/2012, 05/13/2010.  Findings: Cardiac silhouette moderately enlarged but stable.  Hilar and mediastinal contours otherwise unremarkable.  Right subclavian pacing defibrillator unchanged.  Mild pulmonary venous hypertension without overt edema.  Lungs clear.  No pleural effusions. Visualized bony thorax intact.  IMPRESSION: Stable moderate cardiomegaly.  No acute cardiopulmonary disease.   Original Report Authenticated By: Hulan Saas, M.D.   ECG:  Date: 06/16/2012  Rate: 100  Rhythm: normal sinus rhythm  QRS Axis: normal  Intervals: QT prolonged  ST/T Wave abnormalities: nonspecific T wave changes  Conduction Disutrbances:none  Narrative Interpretation: nonspecific T  wave changes and prolonged QT interval lengthened since march 2014 which was used for comparison   Old EKG Reviewed: changes noted    1. Chronic systolic heart failure   2. HTN (hypertension)   3. DM2 (diabetes mellitus, type 2)   4. Suicidal ideation       MDM  [name] presents with symptoms of URI for 3 weeks including productive cough. Record review shows appointment with Dr. Gala Romney at the beginning of May with persistent heart failure symptoms and weight gain.  Plan per last note is for patient to follow-up in one month and he is due for that follow-up.  Patient today with unremarkable CBC, mild increase in serum creatinine close to baseline and urinalysis with 3-6 with blood cells, moderate hemoglobin and a few bacteria, no evidence of significant UTI.  Chest x-ray stable moderate cardiomegaly mild pulmonary venous hypertension without edema no evidence of pleural effusion or consolidation.  Pt weight 230 today (15 lbs heavier than previous optimal).  BNP at baseline and troponin negative.  No evidence of pneumonia or acute on chronic heart failure.    6:30pm Pt now stating that he is suicidal. He states he's been feeling that way for many weeks and he is tired of living. He states this is why he sent his children to [facility] to be with his sister because he was afraid he would wake up to find him dead one day. Patient states his plan was to take all of his medications at one time in time his sleep. He states he sees a psychiatrist on regular basis who has been treating him for his depression but that his psychiatrist is not understand and does not listen.  Patient given lasix 80mg  IM and is medically cleared at this time and will be moved to Pod [facility]. I discussed the patient's case with the ACT team who will further evaluate him and help make a disposition decision.  Dr. Preston Fleeting was consulted, evaluated this patient with me and agrees with the plan.             Dierdre Forth, PA-C 06/16/12 1958  Crystin Lechtenberg, PA-C 06/16/12 2000

## 2012-06-16 NOTE — ED Notes (Signed)
Pt stated he wanted a ride over to [facility] because if was going to have to be in the hospital her was going to hurt himself. RN notified.

## 2012-06-16 NOTE — ED Notes (Signed)
Pt changed into blue scrubs ,security called to wand pt.

## 2012-06-16 NOTE — ED Notes (Signed)
Pt states "If I have to go home, I'm going to kill myself....my kids are gone, I don't have anyone.Marland KitchenMarland KitchenI'm tired, I'm tired". PA notified.

## 2012-06-17 LAB — RAPID URINE DRUG SCREEN, HOSP PERFORMED
Amphetamines: NOT DETECTED
Barbiturates: NOT DETECTED
Opiates: NOT DETECTED
Tetrahydrocannabinol: NOT DETECTED

## 2012-06-17 LAB — URINE CULTURE: Culture: NO GROWTH

## 2012-06-17 MED ORDER — FUROSEMIDE 20 MG PO TABS
80.0000 mg | ORAL_TABLET | Freq: Once | ORAL | Status: AC
  Administered 2012-06-17: 80 mg via ORAL
  Filled 2012-06-17: qty 4

## 2012-06-17 MED ORDER — IBUPROFEN 800 MG PO TABS
800.0000 mg | ORAL_TABLET | Freq: Once | ORAL | Status: AC
  Administered 2012-06-17: 800 mg via ORAL
  Filled 2012-06-17: qty 1

## 2012-06-17 MED ORDER — HYDROCHLOROTHIAZIDE 12.5 MG PO CAPS
12.5000 mg | ORAL_CAPSULE | Freq: Two times a day (BID) | ORAL | Status: DC
  Administered 2012-06-17 – 2012-06-18 (×3): 12.5 mg via ORAL
  Filled 2012-06-17 (×4): qty 1

## 2012-06-17 MED ORDER — CLORAZEPATE DIPOTASSIUM 7.5 MG PO TABS
15.0000 mg | ORAL_TABLET | Freq: Every day | ORAL | Status: DC
  Administered 2012-06-17 – 2012-06-18 (×2): 15 mg via ORAL
  Filled 2012-06-17: qty 2

## 2012-06-17 NOTE — Progress Notes (Signed)
Referral faxed to [facility] [place] for placement consideration. Denice Bors, AADC 06/17/2012 3:21 PM

## 2012-06-17 NOTE — ED Notes (Signed)
Mason Sink, ACT counselor at the bedside.

## 2012-06-17 NOTE — ED Provider Notes (Signed)
Patient is here for suicidal ideation, depression and his CHF. He received Lasix yesterday and had a moderate response.  This morning patient is just angry because they took his food tray away and he still hungry. Currently being reviewed at behavioral health  Gwyneth Sprout, MD 06/17/12 [phone]

## 2012-06-17 NOTE — ED Notes (Signed)
Pt ate 100% of meal tray 
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
## 2012-06-17 NOTE — ED Notes (Signed)
Report given to Kuch, RN.  

## 2012-06-17 NOTE — BH Assessment (Signed)
BHH Assessment Progress Note  Per Jacquelyne Balint, RN, Administrative Coordinator, pt is too medically acute for admission to [facility] at this time.  Pt will be reconsidered tomorrow, 06/18/2012.  In the meantime, Assessment Counselor is advised to seek alternative placement.  Doylene Canning, MA Assessment Counselor 06/17/2012 @ 19:20

## 2012-06-17 NOTE — ED Notes (Signed)
Pt too lethargic to stay awake to eat his meal or take pills. Pt reports "not sleeping for days".

## 2012-06-18 ENCOUNTER — Inpatient Hospital Stay (HOSPITAL_COMMUNITY)
Admission: AD | Admit: 2012-06-18 | Discharge: 2012-06-20 | DRG: 885 | Disposition: A | Discharge status: Home / Self Care | Payer: Medicare Other | Source: Intra-hospital | Attending: Psychiatry | Admitting: Psychiatry

## 2012-06-18 ENCOUNTER — Encounter (HOSPITAL_COMMUNITY): Payer: Self-pay | Admitting: *Deleted

## 2012-06-18 ENCOUNTER — Encounter (HOSPITAL_COMMUNITY): Payer: Self-pay | Admitting: Emergency Medicine

## 2012-06-18 DIAGNOSIS — E119 Type 2 diabetes mellitus without complications: Secondary | ICD-10-CM | POA: Diagnosis present

## 2012-06-18 DIAGNOSIS — F1411 Cocaine abuse, in remission: Secondary | ICD-10-CM | POA: Diagnosis present

## 2012-06-18 DIAGNOSIS — F329 Major depressive disorder, single episode, unspecified: Principal | ICD-10-CM | POA: Diagnosis present

## 2012-06-18 DIAGNOSIS — Z79899 Other long term (current) drug therapy: Secondary | ICD-10-CM

## 2012-06-18 DIAGNOSIS — N183 Chronic kidney disease, stage 3 unspecified: Secondary | ICD-10-CM | POA: Diagnosis present

## 2012-06-18 DIAGNOSIS — I509 Heart failure, unspecified: Secondary | ICD-10-CM | POA: Diagnosis present

## 2012-06-18 DIAGNOSIS — I129 Hypertensive chronic kidney disease with stage 1 through stage 4 chronic kidney disease, or unspecified chronic kidney disease: Secondary | ICD-10-CM | POA: Diagnosis present

## 2012-06-18 DIAGNOSIS — F1410 Cocaine abuse, uncomplicated: Secondary | ICD-10-CM | POA: Diagnosis present

## 2012-06-18 MED ORDER — ISOSORBIDE MONONITRATE ER 60 MG PO TB24
60.0000 mg | ORAL_TABLET | Freq: Every day | ORAL | Status: DC
  Administered 2012-06-19 – 2012-06-20 (×2): 60 mg via ORAL
  Filled 2012-06-18 (×5): qty 1

## 2012-06-18 MED ORDER — ATORVASTATIN CALCIUM 10 MG PO TABS
10.0000 mg | ORAL_TABLET | Freq: Every day | ORAL | Status: DC
  Administered 2012-06-19: 10 mg via ORAL
  Filled 2012-06-18 (×3): qty 1

## 2012-06-18 MED ORDER — MIRTAZAPINE 30 MG PO TABS
30.0000 mg | ORAL_TABLET | Freq: Every day | ORAL | Status: DC
  Administered 2012-06-18 – 2012-06-19 (×2): 30 mg via ORAL
  Filled 2012-06-18 (×5): qty 1

## 2012-06-18 MED ORDER — HYDRALAZINE HCL 25 MG PO TABS
25.0000 mg | ORAL_TABLET | Freq: Three times a day (TID) | ORAL | Status: DC
  Administered 2012-06-19 – 2012-06-20 (×4): 25 mg via ORAL
  Filled 2012-06-18 (×10): qty 1

## 2012-06-18 MED ORDER — PANTOPRAZOLE SODIUM 40 MG PO TBEC
40.0000 mg | DELAYED_RELEASE_TABLET | Freq: Every day | ORAL | Status: DC
  Administered 2012-06-19 – 2012-06-20 (×2): 40 mg via ORAL
  Filled 2012-06-18 (×5): qty 1

## 2012-06-18 MED ORDER — CLORAZEPATE DIPOTASSIUM 3.75 MG PO TABS
7.5000 mg | ORAL_TABLET | Freq: Every day | ORAL | Status: DC
  Administered 2012-06-19 – 2012-06-20 (×2): 7.5 mg via ORAL
  Filled 2012-06-18 (×2): qty 2

## 2012-06-18 MED ORDER — IBUPROFEN 600 MG PO TABS
600.0000 mg | ORAL_TABLET | Freq: Four times a day (QID) | ORAL | Status: DC | PRN
  Administered 2012-06-18 – 2012-06-19 (×2): 600 mg via ORAL
  Filled 2012-06-18 (×2): qty 1

## 2012-06-18 MED ORDER — HYDROCHLOROTHIAZIDE 25 MG PO TABS
12.5000 mg | ORAL_TABLET | Freq: Three times a day (TID) | ORAL | Status: DC
  Filled 2012-06-18 (×4): qty 1

## 2012-06-18 MED ORDER — TORSEMIDE 20 MG PO TABS
20.0000 mg | ORAL_TABLET | Freq: Three times a day (TID) | ORAL | Status: DC
  Filled 2012-06-18 (×4): qty 1

## 2012-06-18 MED ORDER — ALUM & MAG HYDROXIDE-SIMETH 200-200-20 MG/5ML PO SUSP: 30.0000 mL | ORAL | Status: DC | PRN

## 2012-06-18 MED ORDER — ACETAMINOPHEN 325 MG PO TABS: 650.0000 mg | ORAL_TABLET | Freq: Four times a day (QID) | ORAL | Status: DC | PRN

## 2012-06-18 MED ORDER — DIGOXIN 125 MCG PO TABS
0.1250 mg | ORAL_TABLET | Freq: Every day | ORAL | Status: DC
  Administered 2012-06-19 – 2012-06-20 (×2): 0.125 mg via ORAL
  Filled 2012-06-18 (×5): qty 1

## 2012-06-18 MED ORDER — HYDROXYZINE HCL 25 MG PO TABS: 25.0000 mg | ORAL_TABLET | Freq: Three times a day (TID) | ORAL | Status: DC | PRN

## 2012-06-18 MED ORDER — MAGNESIUM HYDROXIDE 400 MG/5ML PO SUSP: 30.0000 mL | Freq: Every day | ORAL | Status: DC | PRN

## 2012-06-18 MED ORDER — CARVEDILOL 3.125 MG PO TABS
3.1250 mg | ORAL_TABLET | Freq: Two times a day (BID) | ORAL | Status: DC
  Administered 2012-06-19 – 2012-06-20 (×3): 3.125 mg via ORAL
  Filled 2012-06-18 (×7): qty 1

## 2012-06-18 MED ORDER — POTASSIUM CHLORIDE ER 10 MEQ PO TBCR
10.0000 meq | EXTENDED_RELEASE_TABLET | Freq: Every day | ORAL | Status: DC
  Administered 2012-06-19 – 2012-06-20 (×2): 10 meq via ORAL
  Filled 2012-06-18 (×5): qty 1

## 2012-06-18 NOTE — ED Notes (Signed)
Report called to [facility], RN at [facility]-- will transfer to rm 305-2
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
## 2012-06-18 NOTE — Progress Notes (Addendum)
Pt vol admitted via [facility] for increased depression and SI to OD. Pt's UDS also + for cocaine which he said he just resumed using within the last month. Pt's wife left him 18 months ago after 16 years of marriage and is prostituting herself on the streets for cocaine. He is sole caretaker to their 3 children ages 66, 67 and 38. They are now with his sister in [place] however relationship with sister is strained. Pt states he is presently "in between houses" and his belongings were recently secured in storage. Pt has significant medical hx with CHF, S/P AICD and per report ejection fraction at only 12%. Pt was under the care of hospice up until Dec of 2013. Pt reports having a DNR however sister has copy and pt does not want sister contacted for any reason. His only copy is in storage. Pt also reports sleep apnea but states his CPAP is in storage. He also reports a hx of O2 therapy at night though has not done so for several months due to terminating relationship with home health nurse. Additionally, hx of insulin dependent diabetes however is not currently monitoring or on meds. (Glucose and HgbA1C both elevated.) Pt also reports wife sold off his pacemaker monitoring station and he has not had his device checked for 2 years. Pt oriented to unit, level III obs initiated. Told patient without copy of DNR staff would be unable to honor it. "That's fine. I don't feel like I'm going to die or anything. I just need someone to talk to." Pt offered food and fluids. He reports allergy to tylenol - "it ripped up my stomach and makes me nauseated when I take it" - however claims to tolerate motrin without difficulty. Dose given for headache with relief. He denies SI/HI/AVH at this time and is safe resting in bed. Lawrence Marseilles
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
## 2012-06-18 NOTE — ED Notes (Signed)
telepsych completed 

## 2012-06-18 NOTE — Tx Team (Signed)
Initial Interdisciplinary Treatment Plan  PATIENT STRENGTHS: (choose at least two) Ability for insight Capable of independent living Communication skills General fund of knowledge  PATIENT STRESSORS: Health problems Marital or family conflict Substance abuse   PROBLEM LIST: Problem List/Patient Goals Date to be addressed Date deferred Reason deferred Estimated date of resolution  SI 06/18/12                                                      DISCHARGE CRITERIA:  Ability to meet basic life and health needs Improved stabilization in mood, thinking, and/or behavior Need for constant or close observation no longer present Reduction of life-threatening or endangering symptoms to within safe limits Verbal commitment to aftercare and medication compliance  PRELIMINARY DISCHARGE PLAN: Attend aftercare/continuing care group Outpatient therapy Return to previous living arrangement  PATIENT/FAMIILY INVOLVEMENT: This treatment plan has been presented to and reviewed with the patient, [name], and/or family member.  The patient and family have been given the opportunity to ask questions and make suggestions.  Merian Capron [facility] 06/18/2012, 8:48 PM

## 2012-06-18 NOTE — Progress Notes (Signed)
Patient did attend the evening speaker AA meeting.  

## 2012-06-18 NOTE — Progress Notes (Addendum)
Call from floor re pt hx of CPAP/O2 and Type II Diabetes without meds/documentation Reportedly put CPAP in storage PTA Pt has significant CHF at Class III-IIIB ?  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

## 2012-06-18 NOTE — Progress Notes (Signed)
Patient accepted to [facility] room 305/2 by Assunta Found, NP. Rosey Bath, RN

## 2012-06-18 NOTE — ED Notes (Signed)
Lunch ordered -- stated "I always get a burger with swiss cheese and fries when ever I am here-- they let me do that yesterday"  Order sent as requested

## 2012-06-18 NOTE — ED Notes (Signed)
Report given to Karen, rn 

## 2012-06-19 ENCOUNTER — Encounter (HOSPITAL_COMMUNITY): Payer: Self-pay | Admitting: Psychiatry

## 2012-06-19 DIAGNOSIS — F1411 Cocaine abuse, in remission: Secondary | ICD-10-CM | POA: Diagnosis present

## 2012-06-19 DIAGNOSIS — F1410 Cocaine abuse, uncomplicated: Secondary | ICD-10-CM

## 2012-06-19 DIAGNOSIS — F329 Major depressive disorder, single episode, unspecified: Principal | ICD-10-CM

## 2012-06-19 LAB — GLUCOSE, CAPILLARY: Glucose-Capillary: 121 mg/dL — ABNORMAL HIGH (ref 70–99)

## 2012-06-19 LAB — GLUCOSE, RANDOM: Glucose, Bld: 133 mg/dL — ABNORMAL HIGH (ref 70–99)

## 2012-06-19 MED ORDER — HYDROCHLOROTHIAZIDE 12.5 MG PO CAPS
37.5000 mg | ORAL_CAPSULE | Freq: Three times a day (TID) | ORAL | Status: DC
  Administered 2012-06-19 – 2012-06-20 (×4): 37.5 mg via ORAL
  Filled 2012-06-19 (×5): qty 3
  Filled 2012-06-19: qty 1
  Filled 2012-06-19 (×5): qty 3

## 2012-06-19 MED ORDER — TORSEMIDE 20 MG PO TABS
60.0000 mg | ORAL_TABLET | Freq: Every day | ORAL | Status: DC
  Administered 2012-06-19 – 2012-06-20 (×2): 60 mg via ORAL
  Filled 2012-06-19 (×4): qty 3

## 2012-06-19 NOTE — BHH Suicide Risk Assessment (Signed)
[name] Adult Inpatient Family/Significant Other Suicide Prevention Education  Suicide Prevention Education:   Patient Refusal for Family/Significant Other Suicide Prevention Education: The patient has refused to provide written consent for family/significant other to be provided Family/Significant Other Suicide Prevention Education during admission and/or prior to discharge.  Physician notified.  CSW provided suicide prevention information with patient.    The suicide prevention education provided includes the following:  Suicide risk factors  Suicide prevention and interventions  National Suicide Hotline telephone number  [facility] assessment telephone number  [facility] Emergency Assistance 911  [facility] and/or Residential Mobile Crisis Unit telephone number   [name], [place] 06/19/2012 2:31 PM

## 2012-06-19 NOTE — BHH Group Notes (Signed)
[facility] LCSW Group Therapy  06/19/2012  1:15 PM   Type of Therapy:  Group Therapy  Participation Level:  Active  Participation Quality:  Appropriate and Attentive  Affect:  Appropriate  Cognitive:  Alert and Appropriate  Insight:  Developing/Improving and Engaged  Engagement in Therapy:  Developing/Improving and Engaged  Modes of Intervention:  Clarification, Confrontation, Discussion, Education, Exploration, Limit-setting, Orientation, Problem-solving, Rapport Building, Dance movement psychotherapist, Socialization and Support  Summary of Progress/Problems: The topic for group today was overcoming obstacles.  Pt discussed overcoming obstacles and what this means for pt.  Pt shared that his biggest obstacle is leaving his wife alone to focus on himself and caring for their children.  Pt was able to process feeling sad about having to leave his wife who he reports his addicted to crack and does not take care of their children.  Pt states that he plans to find a better home for him and his children, change his number and not have contact with his wife.  Pt was receptive to feedback from peers and listened to discussion about not being able to change others or fix others.  Pt shared that he will have a difficult time leaving his wife alone but is ready to try.    [name], [place] 06/19/2012 2:58 PM

## 2012-06-19 NOTE — BHH Group Notes (Signed)
[facility] LCSW Aftercare Discharge Planning Group Note   06/19/2012 8:45 AM  Participation Quality:  Alert and Appropriate   Mood/Affect:  Appropriate  Depression Rating:  0  Anxiety Rating:  0  Thoughts of Suicide:  Pt denies SI/HI  Will you contract for safety?   Yes  Current AVH:  No  Plan for Discharge/Comments:  Pt attended discharge planning group and actively participated in group.  CSW provided pt with today's workbook.  Pt states that he came to the hospital for suicidal thoughts.  Pt was very open with sharing reason for entering the hospital and stressors in his life.  Pt discussed raising his 3 kids on his own due to his wife being strung out on crack and prostituting herself.  Pt expressed anger towards wife not caring about their family but feeling the need to move on and care for himself and their kids.  Pt states that he lives in [place] but is looking for a new home.  Pt states that his sister in [place] has his kids while he is in the hospital.  Pt states that he sees Dr. Donell Beers at [facility] for medication management and is open to a referral for therapy.  No further needs voiced by pt at this time.      Transportation Means: Pt states that he uses the city bus for transportation  Supports: Pt's sister is supportive  [name], Theresia Majors 06/19/2012 9:26 AM
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
## 2012-06-19 NOTE — Progress Notes (Signed)
[facility] Group Notes:  (Nursing/MHT/Case Management/Adjunct)  Date:  06/19/2012  Time:  11:19 AM  Type of Therapy:  Therapeutic Activtiy  Participation Level:  Active  Participation Quality:  Appropriate, Attentive and Sharing  Affect:  Appropriate  Cognitive:  Appropriate  Insight:  Appropriate and Good  Engagement in Group:  Engaged  Modes of Intervention:  Activity, Discussion, Education and Socialization  Summary of Progress/Problems: Osei attended and participated in therapeutic activity of apples to apples. Patient was able to play the game where the participants had to guess the best card to fit the judges card throughout the game. Patient was appropriate and shared during group.     Karleen Hampshire Brittini 06/19/2012, 11:19 AM

## 2012-06-19 NOTE — Progress Notes (Signed)
Psychoeducational Group Note  Date:  06/19/2012 Time:  1100  Group Topic/Focus:  Wellness Toolbox:   The focus of this group is to discuss various aspects of wellness, balancing those aspects and exploring ways to increase the ability to experience wellness.  Patients will create a wellness toolbox for use upon discharge.  Participation Level: Did Not Attend  Participation Quality:  Not Applicable  Affect:  Not Applicable  Cognitive:  Not Applicable  Insight:  Not Applicable  Engagement in Group: Not Applicable  Additional Comments:  Pt was in a consult with doctor.  Sharyn Lull 06/19/2012, 12:09 PM

## 2012-06-19 NOTE — Tx Team (Signed)
Interdisciplinary Treatment Plan Update (Adult)  Date: 06/19/2012  Time Reviewed:  9:45 AM  Progress in Treatment: Attending groups: Yes Participating in groups:  Yes Taking medication as prescribed:  Yes  Tolerating medication:  Yes Family/Significant othe contact made: CSW assessing Patient understands diagnosis:  Yes Discussing patient identified problems/goals with staff:  Yes Medical problems stabilized or resolved:  Yes Denies suicidal/homicidal ideation: Yes Issues/concerns per patient self-inventory:  Yes Other:  New problem(s) identified: N/A  Discharge Plan or Barriers: CSW assessing for appropriate referrals.    Reason for Continuation of Hospitalization: Anxiety Depression Medication Stabilization  Comments: N/A  Estimated length of stay: 3-5 days  For review of initial/current patient goals, please see plan of care.  Attendees: Patient:     Family:     Physician:  Dr. Lugo 06/19/2012 10:21 AM   Nursing:   Donna Shimp, RN 06/19/2012 10:21 AM   Clinical Social Worker:  Jannine Abreu Horton, LCSWA 06/19/2012 10:21 AM   Other: Vivian Kent, RN 06/19/2012 10:21 AM   Other:  Catherine Harrill, LCSWA 06/19/2012 10:21 AM   Other:  Matthew Rainbolt PA intern 06/19/2012 10:21 AM  Other:  Anahita Kalianivala Psyc intern 06/19/2012 10:21 AM  Other:    Other:    Other:    Other:    Other:    Other:     Scribe for Treatment Team:   Horton, Kennieth Plotts Nicole, 06/19/2012 , 10:21 AM     
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
## 2012-06-19 NOTE — Progress Notes (Signed)
Inpatient Diabetes Program Recommendations  AACE/ADA: New Consensus Statement on Inpatient Glycemic Control (2013)  Target Ranges:  Prepandial:   less than 140 mg/dL      Peak postprandial:   less than 180 mg/dL (1-2 hours)      Critically ill patients:  140 - 180 mg/dL   Reason for Assessment: Consult.  Hgb A1C 7.2 on no medication.  Patient has severe CHF and uses cocaine.  Admitted from ED 06/18/2012 with no treatment for reported diagnosis of type 2 diabetes.  Inpatient Diabetes Program Recommendations HgbA1C: Last Hgb A1C was 7.3 on 05/10/2012 which indicates a mean glucose of 183 mg/dl. Diet: Has  Regular diet order  Note:  Have read patient's history.  Has several diagnoses related to psychiatric and psycho-social issues-- including depression, history of ETOH abuse, positive for cocaine, etc.   Currently at [facility] due to suicidal ideation.  Note patient's medical history includes multiple diagnoses-- some of which include severe CHF, stage 3 renal disease, type 2 diabetes, cardiomyopathy, etc.  Was recently under Hospice Care.    In general, The American Diabetes Association (ADA) recommends a Hgb A1C of under, or around, 7% for the general population.  [The American Academy of Clinical Endocrinologists (AACE) suggests a goal of 6.5.]    According to the American Diabetes Association: Less stringent A1C goals (such as ,8%) may be appropriate for patients with a history of severe hypoglycemia, limited life expectancy, advanced microvascular or macrovascular complications, extensive comorbid conditions, and those with long-standing diabetes in whom the general goal is difficult to attain despite DSME, appropriate glucose monitoring, and effective doses of multiple glucose-lowering agents including insulin. (B)  Glycemic control goals for this patient qualify as being less stringent according to the ADA Standards of Care.  Given substance abuse, CHF, etc., I do not recommend any oral  diabetes medications.  Currently there are no orders for routine CBG testing for this patient.  The ADA recommends that patients with diabetes have CBG's checked at least four times a day while in a hospital setting.  If this patient's treatment at [facility] is considered to be acute care, please order CBG's ac & hs while eating or if patient becomes NPO, every 4 hours.  If patient's treatment at [facility] is considered non-acute, please order CBG's twice daily-- before breakfast and supper.  Given patient's medical and psychiatric history, do not recommend strict adherence to a diabetic diet.  Consider changing patient's diet order to [facility] Modified Medium-- so patient can at least be encouraged to limit concentrated sources of sugar in beverages, desserts, etc.  Thank you for the consult.  Yeraldin Litzenberger S. Elsie Lincoln, RN, CNS, CDE Inpatient Diabetes Program, team pager [phone]

## 2012-06-19 NOTE — Progress Notes (Signed)
Patient ID: UMAR PATMON, male   DOB: 08-17-1965, 47 y.o.   MRN: [id number] He has been up and about and to groups today. Interacting with peers and staff attending groups, Self inventory: depression 6, hopelessness 9, w/d's of tremors and chilling this AM. Denies SI thoughts. He requested Prn of motrin for a h/a at 12:02 that was effective.

## 2012-06-19 NOTE — [facility] Suicide Risk Assessment (Signed)
Suicide Risk Assessment  Admission Assessment     Nursing information obtained from:  Patient;Review of record Demographic factors:  Male;Access to firearms Current Mental Status:  Suicidal ideation indicated by patient;Suicide plan;Plan includes specific time, place, or method;Self-harm thoughts;Intention to act on suicide plan Loss Factors:  Loss of significant relationship Historical Factors:  Prior suicide attempts Risk Reduction Factors:  Responsible for children under 45 years of age;Sense of responsibility to family;Living with another person, especially a relative  CLINICAL FACTORS:   Depression:   Comorbid alcohol abuse/dependence Insomnia Severe Alcohol/Substance Abuse/Dependencies Chronic Pain Medical Diagnoses and Treatments/Surgeries  COGNITIVE FEATURES THAT CONTRIBUTE TO RISK: None identified   SUICIDE RISK:   Moderate:  Frequent suicidal ideation with limited intensity, and duration, some specificity in terms of plans, no associated intent, good self-control, limited dysphoria/symptomatology, some risk factors present, and identifiable protective factors, including available and accessible social support.  PLAN OF CARE: Supportive approach/coping skills/relapse prevention                              Identify detox needs                               Reassess and address the co morbidities  I certify that inpatient services furnished can reasonably be expected to improve the patient's condition.  Chukwuma Straus A 06/19/2012, 1:55 PM

## 2012-06-20 MED ORDER — HYDRALAZINE HCL 25 MG PO TABS: 25.0000 mg | ORAL_TABLET | Freq: Three times a day (TID) | ORAL | Status: DC

## 2012-06-20 MED ORDER — MIRTAZAPINE 30 MG PO TABS: 30.0000 mg | ORAL_TABLET | Freq: Every day | ORAL | Status: DC

## 2012-06-20 MED ORDER — ISOSORBIDE MONONITRATE ER 60 MG PO TB24: 60.0000 mg | ORAL_TABLET | Freq: Every day | ORAL | Status: DC

## 2012-06-20 MED ORDER — CLORAZEPATE DIPOTASSIUM 7.5 MG PO TABS: 7.5000 mg | ORAL_TABLET | Freq: Every day | ORAL | Status: DC

## 2012-06-20 MED ORDER — HYDROXYZINE HCL 25 MG PO TABS: 25.0000 mg | ORAL_TABLET | Freq: Three times a day (TID) | ORAL | Status: DC | PRN

## 2012-06-20 MED ORDER — POTASSIUM CHLORIDE ER 10 MEQ PO TBCR: 10.0000 meq | EXTENDED_RELEASE_TABLET | Freq: Every day | ORAL | Status: DC

## 2012-06-20 MED ORDER — CARVEDILOL 3.125 MG PO TABS: 3.1250 mg | ORAL_TABLET | Freq: Two times a day (BID) | ORAL | Status: DC

## 2012-06-20 MED ORDER — OMEPRAZOLE 20 MG PO CPDR: 20.0000 mg | DELAYED_RELEASE_CAPSULE | Freq: Every day | ORAL | Status: DC

## 2012-06-20 MED ORDER — ROSUVASTATIN CALCIUM 10 MG PO TABS: 10.0000 mg | ORAL_TABLET | Freq: Every day | ORAL | Status: DC

## 2012-06-20 MED ORDER — HYDROCHLOROTHIAZIDE 25 MG PO TABS: 37.5000 mg | ORAL_TABLET | Freq: Three times a day (TID) | ORAL | Status: DC

## 2012-06-20 MED ORDER — TORSEMIDE 20 MG PO TABS: 60.0000 mg | ORAL_TABLET | Freq: Every day | ORAL | Status: DC

## 2012-06-20 MED ORDER — DIGOXIN 125 MCG PO TABS: 0.1250 mg | ORAL_TABLET | Freq: Every day | ORAL | Status: DC
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
## 2012-06-20 NOTE — Progress Notes (Signed)
Patient ID: [name], male   DOB: 03/01/1965, 47 y.o.   MRN: [id number] He has been discharged was going to a hotel. He left via a cab he paid for. He stated that he understood the discharge instruction and the follow up plan.  Denies thought of SI. Said that he had all his medications  And that he did not need the medication or prescription. All belongs taken with him.

## 2012-06-20 NOTE — Progress Notes (Signed)
[facility] Adult Case Management Discharge Plan :  Will you be returning to the same living situation after discharge: No. Pt is moving to a new home At discharge, do you have transportation home?:Yes,  access to transportation Do you have the ability to pay for your medications:Yes,  access to meds  Release of information consent forms completed and in the chart;  Patient's signature needed at discharge.  Patient to Follow up at: Follow-up Information   Follow up with [facility] On 07/28/2012. (Appointment scheduled at 9:45 am for medication management with Dr. Donell Beers)    Contact information:   [address] [place], [place] [address] [phone]      Follow up with [facility] On 06/21/2012. (Appointment scheduled at 3:00 pm with Angus Palms for therapy)    Contact information:   [address] [place], [place] [address] Phone: [phone] Fax: [phone]      Patient denies SI/HI:   Yes,  denies SI/HI    Safety Planning and Suicide Prevention discussed:  Yes,  discussed with pt (refused contact with family, see suicide prevention note)  [name], [name] 06/20/2012, 9:42 AM

## 2012-06-20 NOTE — Progress Notes (Signed)
D: Patient denies SI/HI/AVH. Patient rates hopelessness as 1,  depression as 1, and anxiety as 1.  Patient affect is flat. Mood is depressed.  Pt's mood is inconguent with his report.  Although pt denies depression, his mood is depressed.  Pt states "I'm just tired. I'm ready to go home."  Patient did attend evening group. Patient visible on the milieu. No distress noted. A: Support and encouragement offered. Scheduled medications given to pt. Q 15 min checks continued for patient safety. R: Patient receptive. Patient remains safe on the unit.

## 2012-06-20 NOTE — [facility] Suicide Risk Assessment (Signed)
Suicide Risk Assessment  Discharge Assessment     Demographic Factors:  Male  Mental Status Per Nursing Assessment::   On Admission:  Suicidal ideation indicated by patient;Suicide plan;Plan includes specific time, place, or method;Self-harm thoughts;Intention to act on suicide plan  Current Mental Status by Physician: In full contact with reality. There are no suicidal ideas, plans or intent. His mood is euthymic, his affect is appropriate. He wants to go straight to [place] from here to try to get a new place to take the kids out of the environment they are. States he knows he needs to abstain  for  himself and his kids   Loss Factors: Loss of significant relationship and Decline in physical health  Historical Factors: NA  Risk Reduction Factors:   Responsible for children under 36 years of age and Sense of responsibility to family  Continued Clinical Symptoms:  Depression:   Comorbid alcohol abuse/dependence Alcohol/Substance Abuse/Dependencies  Cognitive Features That Contribute To Risk: None identified   Suicide Risk:  Minimal: No identifiable suicidal ideation.  Patients presenting with no risk factors but with morbid ruminations; may be classified as minimal risk based on the severity of the depressive symptoms  Discharge Diagnoses:   AXIS I:  Major Depression, Cocaine Abuse AXIS II:  Deferred AXIS III:   Past Medical History  Diagnosis Date  . Hypertension   . 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 History of peptic ulcer disease     EGD 09/2007 shows prox gastric ulcer with black eschar, treated with PPI  . Depression   . History of alcohol abuse     quit approx 2011-2012  . Angina   . Shortness of breath   . OSA (obstructive sleep apnea)     sleeps with oxygen  . Cardiomyopathy   . ICD (implantable cardiac defibrillator) in place   . Diabetes  mellitus     insulin dependent  . GERD (gastroesophageal reflux disease)   . Headache(784.0)   . Myasthenia gravis 10/12/2011    Diagnosed in 09/2011. Received Plasmapheresis. CT chest no Thymoma. Acetylcholine receptor antibody negative.    . CKD (chronic kidney disease) stage 3, GFR 30-59 ml/min   . Myasthenia gravis   . Myasthenia gravis    AXIS IV:  housing problems and other psychosocial or environmental problems AXIS V:  61-70 mild symptoms  Plan Of Care/Follow-up recommendations:  Activity:  as tolerated Diet:  regular pursue further follow up trough [facility] Is patient on multiple antipsychotic therapies at discharge:  No   Has Patient had three or more failed trials of antipsychotic monotherapy by history:  No  Recommended Plan for Multiple Antipsychotic Therapies: N/A   [name] A 06/20/2012, 10:40 AM

## 2012-06-20 NOTE — Progress Notes (Signed)
Patient ID: [name], male   DOB: 1965/09/03, 47 y.o.   MRN: [id number] He said that he wanted to leave today that his plan is to rent a room for a week and look for an apartment. He was then going to get his kids.

## 2012-06-20 NOTE — [facility] Group Notes (Signed)
[facility] LCSW Aftercare Discharge Planning Group Note   06/20/2012 8:45 AM  Participation Quality:  Alert and Appropriate   Mood/Affect:  Appropriate  Depression Rating:  0  Anxiety Rating:  0  Thoughts of Suicide:  Pt denies SI/HI  Will you contract for safety?   Yes  Current AVH:  No  Plan for Discharge/Comments:  Pt attended discharge planning group and actively participated in group.  CSW provided pt with today's workbook.  Pt states that he feels ready to d/c today.  Pt states that he is ready to leave to find a new home for him and his children.  Pt states that he plans to find a home and rest until his kids come back in 3 weeks.  Pt was excited to tell the group that he told his wife no for the first time yesterday.  Pt shared that she called him here only to ask for money and he said no.  Pt has follow up scheduled at [facility] and [facility] for medication management and therapy.  No further needs voiced by pt at this time.      Transportation Means: Pt states that he uses the city bus for transportation but would rather pay for a cab to get home today  Supports: Pt's sister is supportive  [name], [name] 06/20/2012 9:36 AM

## 2012-06-22 ENCOUNTER — Ambulatory Visit (HOSPITAL_COMMUNITY): Admission: RE | Admit: 2012-06-22 | Payer: Medicare Other | Source: Ambulatory Visit

## 2012-06-22 NOTE — Progress Notes (Signed)
Patient Discharge Instructions:  After Visit Summary (AVS):   Faxed to:  06/22/12 Discharge Summary Note:   Faxed to:  06/22/12 Psychiatric Admission Assessment Note:   Faxed to:  06/22/12 Suicide Risk Assessment - Discharge Assessment:   Faxed to:  06/22/12 Faxed/Sent to the Next Level Care provider:  06/22/12 Next Level Care Provider Has Access to the EMR, 06/22/12 Faxed to [facility] @ [phone] Records provided to [facility] via [facility]/Epic access  Jerelene Redden, 06/22/2012, 2:11 PM

## 2012-06-23 ENCOUNTER — Encounter (HOSPITAL_COMMUNITY): Payer: Self-pay | Admitting: *Deleted

## 2012-06-26 ENCOUNTER — Encounter (HOSPITAL_COMMUNITY): Payer: Self-pay | Admitting: Internal Medicine

## 2012-06-27 ENCOUNTER — Encounter: Payer: Self-pay | Admitting: Licensed Clinical Social Worker

## 2012-06-27 NOTE — Progress Notes (Signed)
CSW received from physician Supportive Housing Program Certification of Disability.  Pt is currently staying at homeless shelter at [facility].  CSW placed call to [employer] as pt received form to be completed and faxed but ROI is not signed on the form.  CSW spoke with housing representative, [place]. And Mr. Macneal via speaker phone.  Pt verbally giving permission for CSW to fax form directly to [facility], Supportive Housing Program.  CSW inquired if pt had psychiatrist complete portion, pt stated he did not think of that.  CSW encouraged pt to have psychiatrist complete the Mental Health form.  Pt states his cardiologist faxed a portion into the [employer].  CSW inquire regarding the whereabouts of Mr. Lina children and if they were with him at the shelter.  Pt states all children are staying with his sister in [place], [place] until 6/19.  He is seeking housing assistance for himself and his 3 children.  CSW faxed certification to Supportive Housing Program.

## 2012-07-03 ENCOUNTER — Encounter: Payer: Self-pay | Admitting: Internal Medicine

## 2012-07-03 ENCOUNTER — Ambulatory Visit (INDEPENDENT_AMBULATORY_CARE_PROVIDER_SITE_OTHER): Payer: Medicare Other | Admitting: Internal Medicine

## 2012-07-03 VITALS — BP 116/88 | HR 99 | Temp 97.7°F | Ht 72.0 in | Wt 224.0 lb

## 2012-07-03 DIAGNOSIS — I5022 Chronic systolic (congestive) heart failure: Secondary | ICD-10-CM

## 2012-07-03 DIAGNOSIS — K279 Peptic ulcer, site unspecified, unspecified as acute or chronic, without hemorrhage or perforation: Secondary | ICD-10-CM

## 2012-07-03 DIAGNOSIS — E119 Type 2 diabetes mellitus without complications: Secondary | ICD-10-CM

## 2012-07-03 LAB — BASIC METABOLIC PANEL WITH GFR
BUN: 26 mg/dL — ABNORMAL HIGH (ref 6–23)
CO2: 25 mEq/L (ref 19–32)
Chloride: 103 mEq/L (ref 96–112)
GFR, Est Non African American: 46 mL/min — ABNORMAL LOW
Potassium: 4.4 mEq/L (ref 3.5–5.3)

## 2012-07-03 LAB — HM DIABETES EYE EXAM

## 2012-07-03 LAB — GLUCOSE, CAPILLARY: Glucose-Capillary: 178 mg/dL — ABNORMAL HIGH (ref 70–99)

## 2012-07-03 MED ORDER — OMEPRAZOLE 20 MG PO CPDR
20.0000 mg | DELAYED_RELEASE_CAPSULE | Freq: Every day | ORAL | Status: DC
Start: 2012-07-03 — End: 2012-09-06

## 2012-07-03 MED ORDER — GLIPIZIDE 10 MG PO TABS: 10.0000 mg | ORAL_TABLET | Freq: Every day | ORAL | Status: DC

## 2012-07-03 MED ORDER — METOLAZONE 2.5 MG PO TABS: 2.5000 mg | ORAL_TABLET | Freq: Every day | ORAL | Status: DC

## 2012-07-03 NOTE — Patient Instructions (Addendum)
1. Please keep taking your torsemide every day. 2. I have prescribed metolazone 2.5mg  once a day FOR THREE DAYS. 3. I will call you about your labs.
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
## 2012-07-03 NOTE — Assessment & Plan Note (Signed)
Lab Results  Component Value Date   HGBA1C 7.3 05/10/2012   HGBA1C 5.4 12/27/2011   HGBA1C 8.2* 09/03/2011     Assessment: Diabetes control: fair control Progress toward A1C goal:  unable to assess Comments: Has not been on glipizide 10mg  for weeks-months  Plan: Medications:  Refilled glipizide 10mg  today Home glucose monitoring: Frequency: no home glucose monitoring Timing: N/A  Instruction/counseling given: other instruction/counseling: emphasized importance of med compliance Educational resources provided: brochure Self management tools provided:   Other plans: retinal scanner tdoay

## 2012-07-03 NOTE — Assessment & Plan Note (Signed)
Weight 224 today, per review HF team notes, goal 215 lbs. Only taking torsemide 60mg  daily, no other meds. mIssed his last HF clinic appt earlier this month. He is symptomatic from HF but does not appear acutely decompensated today (only trace pedal edema, no crackles).  There seem to be a lot of barriers to care relating to homelessness, lack of social and financial supports. Our CSW has worked closely w him advocating for housing needs.  From review of prior records, appear he has had success w intermittent low dose metolazone. - Return to HF clinic, our RN called clinic and they arranged to work him in this Thursday - In the meantime, continue torsemide 60 daily, add metolazone 2.5mg  qd x 3 days - Bmet today - Our CSW is not here today, will send message to call and f/u w pt.

## 2012-07-03 NOTE — Progress Notes (Signed)
I discussed patient with resident Dr. Ziemer, and I agree with the plans as outlined in her note. 

## 2012-07-05 ENCOUNTER — Encounter: Payer: Self-pay | Admitting: Licensed Clinical Social Worker

## 2012-07-05 NOTE — Progress Notes (Signed)
Patient ID: [name], male   DOB: 12-25-1965, 47 y.o.   MRN: [id number] CSW received call from Supportive Housing Program indicating pt's application was incomplete.  CSW called Housing Specialist and informed her pt's application was incomplete.  Housing specialist states she completed the application and sent in, requesting CSW to send physician's portion to housing specialist.  CSW informed housing specialist, would need ROI as the form does have medical information listed.  Housing specialist states she will call [name] and have him pick form up from [facility] to provide to her.  CSW will leave original at front office, copy sent to be scanned in EMR.

## 2012-07-06 ENCOUNTER — Ambulatory Visit (HOSPITAL_COMMUNITY)
Admission: RE | Admit: 2012-07-06 | Discharge: 2012-07-06 | Disposition: A | Discharge status: Home / Self Care | Payer: Medicare Other | Source: Ambulatory Visit | Attending: Internal Medicine | Admitting: Internal Medicine

## 2012-07-06 ENCOUNTER — Encounter (HOSPITAL_COMMUNITY): Payer: Self-pay

## 2012-07-06 VITALS — BP 110/88 | HR 92 | Wt 230.0 lb

## 2012-07-06 DIAGNOSIS — Z7902 Long term (current) use of antithrombotics/antiplatelets: Secondary | ICD-10-CM | POA: Insufficient documentation

## 2012-07-06 DIAGNOSIS — G7000 Myasthenia gravis without (acute) exacerbation: Secondary | ICD-10-CM | POA: Insufficient documentation

## 2012-07-06 DIAGNOSIS — N183 Chronic kidney disease, stage 3 unspecified: Secondary | ICD-10-CM | POA: Insufficient documentation

## 2012-07-06 DIAGNOSIS — I428 Other cardiomyopathies: Secondary | ICD-10-CM | POA: Insufficient documentation

## 2012-07-06 DIAGNOSIS — F329 Major depressive disorder, single episode, unspecified: Secondary | ICD-10-CM | POA: Insufficient documentation

## 2012-07-06 DIAGNOSIS — F3289 Other specified depressive episodes: Secondary | ICD-10-CM | POA: Insufficient documentation

## 2012-07-06 DIAGNOSIS — I509 Heart failure, unspecified: Secondary | ICD-10-CM | POA: Insufficient documentation

## 2012-07-06 DIAGNOSIS — I5022 Chronic systolic (congestive) heart failure: Secondary | ICD-10-CM

## 2012-07-06 DIAGNOSIS — K219 Gastro-esophageal reflux disease without esophagitis: Secondary | ICD-10-CM | POA: Insufficient documentation

## 2012-07-06 DIAGNOSIS — Z95810 Presence of automatic (implantable) cardiac defibrillator: Secondary | ICD-10-CM | POA: Insufficient documentation

## 2012-07-06 DIAGNOSIS — Z794 Long term (current) use of insulin: Secondary | ICD-10-CM | POA: Insufficient documentation

## 2012-07-06 DIAGNOSIS — Z8711 Personal history of peptic ulcer disease: Secondary | ICD-10-CM | POA: Insufficient documentation

## 2012-07-06 DIAGNOSIS — E119 Type 2 diabetes mellitus without complications: Secondary | ICD-10-CM | POA: Insufficient documentation

## 2012-07-06 DIAGNOSIS — G4733 Obstructive sleep apnea (adult) (pediatric): Secondary | ICD-10-CM | POA: Insufficient documentation

## 2012-07-06 DIAGNOSIS — I129 Hypertensive chronic kidney disease with stage 1 through stage 4 chronic kidney disease, or unspecified chronic kidney disease: Secondary | ICD-10-CM | POA: Insufficient documentation

## 2012-07-06 NOTE — Assessment & Plan Note (Signed)
NYHA IIIb. Volume status elevated. Continue torsemide 60 mg daily and he is instructed to take Metolazone 2.5 mg today and tomorrow. Have encouraged him to take all his medications but given his challenging social issues I am not that is possible. Will need to restart para medicine program after he has a place to live. Follow up in 2 weeks to reassess.

## 2012-07-06 NOTE — Progress Notes (Signed)
Patient ID: [name], male   DOB: 05-26-1965, 47 y.o.   MRN: [id number] Psychiatrist: Dr Anthony Sar PCP: Dr Loistine Chance  HPI: 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 Cath in 2008 showed left circumflex with 40% stenosis. Baseline weight about 210. Was previously on [facility] but this ended in December in 2013 as he was doing well.   Admitted 03/2011 with NYHA Class IV symptoms, requiring milrinone therapy and IV lasix. Cath 04/13/11: cardiogenic shock with biventricular failure (R>L) and possible restrictive physiology. RA = 22 with steep y-descents, RV = 45/12/25, PA = 51/25 (34), PCW = 18-20, Fick cardiac output/index = 3.1/1.4, Thermo CO/CI = 3.6/1.6, PVR = 4.8 Woods O2 sat = 85% (after sedation), PA sat = 31%, 36%. Milrinone was initiated for low output physiology.   [name] stopped due to hyperkalemia. Beanzepril and lopressor stopped due to hypotension.  He returns for follow up with his 3 children. Yesterday he was evaluated by PCP and he given Metolazone 2.5 mg for 3 days but could not pay for it.    Complains of exertional dyspnea and fatigue. Marland Kitchen He is living at [facility] and he has to leave at 8 and can return at 5 pm. [place] to get [place] to [place]. He has all medicines but he is only taking torsemide 60 mg daily because he can not sleep during the day and says he is tired after he leaves the shelter. Unable to use CPAP machine.    ROS: All systems negative except as listed in HPI, PMH and Problem List.  Past Medical History  Diagnosis Date  . Hypertension   . 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 History of peptic ulcer disease     EGD 09/2007 shows prox gastric ulcer with black eschar, treated with PPI  . Depression   . History of alcohol abuse     quit approx 2011-2012  . Angina   . Shortness of  breath   . OSA (obstructive sleep apnea)     sleeps with oxygen  . Cardiomyopathy   . ICD (implantable cardiac defibrillator) in place   . Diabetes mellitus     insulin dependent  . GERD (gastroesophageal reflux disease)   . Headache(784.0)   . Myasthenia gravis 10/12/2011    Diagnosed in 09/2011. Received Plasmapheresis. CT chest no Thymoma. Acetylcholine receptor antibody negative.    . CKD (chronic kidney disease) stage 3, GFR 30-59 ml/min   . Myasthenia gravis   . Myasthenia gravis     Current Outpatient Prescriptions  Medication Sig Dispense Refill  . carvedilol (COREG) 3.125 MG tablet Take 1 tablet (3.125 mg total) by mouth 2 (two) times daily with a meal. For heart problems/HTN      . clorazepate (TRANXENE) 7.5 MG tablet Take 1 tablet (7.5 mg total) by mouth daily. Take three tablets by mouth three times a day (1 in the morning and 2 in the evening): For anxiety  30 tablet  3  . digoxin (LANOXIN) 0.125 MG tablet Take 1 tablet (0.125 mg total) by mouth daily. For heart problems      . glipiZIDE (GLUCOTROL) 10 MG tablet Take 1 tablet (10 mg total) by mouth daily.  30 tablet  11  . hydrALAZINE (APRESOLINE) 25 MG tablet Take 1 tablet (25 mg  total) by mouth 3 (three) times daily. For control of high blood pressure      . hydrochlorothiazide (HYDRODIURIL) 25 MG tablet Take 1.5 tablets (37.5 mg total) by mouth 3 (three) times daily. For high blood pressure control      . hydrOXYzine (ATARAX/VISTARIL) 25 MG tablet Take 1-2 tablets (25-50 mg total) by mouth 3 (three) times daily as needed for itching or anxiety. Take 1 or 2 tablets by mouth 3 times daily as needed for itching.(Max of 6 tablets a day): For anxiety/itching  30 tablet  0  . isosorbide mononitrate (IMDUR) 60 MG 24 hr tablet Take 1 tablet (60 mg total) by mouth daily. For chronic chest pains      . metolazone (ZAROXOLYN) 2.5 MG tablet Take 1 tablet (2.5 mg total) by mouth daily.  3 tablet  0  . mirtazapine (REMERON) 30 MG tablet  Take 1 tablet (30 mg total) by mouth at bedtime. For depression/sleep      . omeprazole (PRILOSEC) 20 MG capsule Take 1 capsule (20 mg total) by mouth daily. For acid reflux      . potassium chloride (K-DUR) 10 MEQ tablet Take 1 tablet (10 mEq total) by mouth daily. For low potassium      . rosuvastatin (CRESTOR) 10 MG tablet Take 1 tablet (10 mg total) by mouth daily. For control of high cholesterol      . torsemide (DEMADEX) 20 MG tablet Take 3 tablets (60 mg total) by mouth daily. For control of high blood pressure/sewllings.      . [DISCONTINUED] metoprolol tartrate (LOPRESSOR) 25 MG tablet Take 1 tablet (25 mg total) by mouth 2 (two) times daily.  60 tablet  1  . [DISCONTINUED] traZODone (DESYREL) 25 mg TABS Take 0.5 tablets (25 mg total) by mouth at bedtime.  30 tablet  1   No current facility-administered medications for this encounter.     PHYSICAL EXAM: Filed Vitals:   07/06/12 1346  BP: 110/88  Pulse: 92  Weight: 230 lb (104.327 kg)  SpO2: 100%    General:  Chronically ill appearing.  No resp difficulty son and 2 daughters present HEENT: normal Neck: supple. JVP 9-10 Carotids 2+ bilaterally; no bruits. No lymphadenopathy or thryomegaly appreciated. Cor: PMI normal. Regular rate & rhythm. 2/6 MR Lungs: clear Abdomen: obese, soft, nontender, mildly distended. No hepatosplenomegaly. No bruits or masses. Good bowel sounds. Extremities: no cyanosis, clubbing, rash, R and LLE 1+ edema Neuro: alert & orientedx3, cranial nerves grossly intact. Moves all 4 extremities w/o difficulty. Affect pleasant.     ASSESSMENT & PLAN:

## 2012-07-10 ENCOUNTER — Encounter: Payer: Self-pay | Admitting: Licensed Clinical Social Worker

## 2012-07-10 ENCOUNTER — Encounter: Payer: Self-pay | Admitting: Internal Medicine

## 2012-07-10 ENCOUNTER — Ambulatory Visit (INDEPENDENT_AMBULATORY_CARE_PROVIDER_SITE_OTHER): Payer: Medicare Other | Admitting: Internal Medicine

## 2012-07-10 VITALS — BP 118/81 | HR 95 | Temp 98.3°F | Ht 71.9 in | Wt 226.3 lb

## 2012-07-10 DIAGNOSIS — I5022 Chronic systolic (congestive) heart failure: Secondary | ICD-10-CM

## 2012-07-10 DIAGNOSIS — I10 Essential (primary) hypertension: Secondary | ICD-10-CM

## 2012-07-10 DIAGNOSIS — R05 Cough: Secondary | ICD-10-CM

## 2012-07-10 MED ORDER — BENZONATATE 200 MG PO CAPS: 200.0000 mg | ORAL_CAPSULE | Freq: Three times a day (TID) | ORAL | Status: DC | PRN
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
## 2012-07-10 NOTE — Progress Notes (Signed)
CSW met with [name] following his scheduled [facility] appt.  [name] is caught in unfortunate social situation.  Pt is currently receiving disability benefits and has Medicare.  [name] is the primary custodian of his 3 minor children.  Spouse is not in communication with pt nor children.  At this time, [name] and children are residing in family homeless shelter.  Pt has applied for the Supportive Housing Program, but when this CSW sent in the physician documentation, pt's application was denied as it needed the entire application.  Shelter worker sent in the application.  CSW provided [name] with physician documentation to provide to shelter housing specialist if he wanted to proceed with that application.  [name] states he is working on completing his application for [employer], but requires paperwork for [employer].  At this time, pt is lacking the $2.50 copay for his medications.  Unfortunately, [facility] unable to assist.  Pt will be able to obtain medications next week when his disability check arrives.  CSW inquired if pt applied for Medicaid.  Pt states children are covered by Medicaid but was told he would have $4000/deductible/spend-down.  This was several years ago.  CSW encouraged Mr. Wavra to explore applying again.  CSW discussed referral to [facility], as pt was current with [facility] several months ago but was d/c.  Pt states at this time he does not want a referral to [facility].  CSW assisted Mr. Hodes with transportation today.  Provided pt with 2 -all day passes to assist with housing application and 2 -one way reduced fare passes.

## 2012-07-10 NOTE — Patient Instructions (Addendum)
Like we talked about, add back your digoxin once a day and your carvedilol twice a day. Please try to fill the metolazone and take 1 pill for three days. Follow up with heart failure clinic next week and come back and see [place] in 2 weeks.  If symptoms get a lot worse (worsening chest pain, trouble breathing, bad swelling), go to the ER.

## 2012-07-10 NOTE — Assessment & Plan Note (Signed)
Weight gain of 4 pounds from heart failure clinic appointment last week, however still volume overloaded. Our clinical social worker came to speak to patient today. Informed him that since that with his insurance, he can refill all of his needed medications from the pharmacy and then subsequently arrange payment plan. I urged him to fill metolazone 2.5 mg x3 days but is waiting for him at his pharmacy. After extended discussion, he agreed to add back 2 of his medications today, carvedilol and digoxin.  Will followup with the heart failure clinic next week, followup with [place] the following week. Counseled on when to call the clinic, presented to the ER if worsening symptoms.

## 2012-07-10 NOTE — Assessment & Plan Note (Signed)
Controlled, not taking any BP meds other than diuretic.

## 2012-07-10 NOTE — Progress Notes (Signed)
Case discussed with Dr. Ziemer soon after the resident saw the patient. We reviewed the resident's history and exam and pertinent patient test results. I agree with the assessment, diagnosis, and plan of care documented in the resident's note. 

## 2012-07-11 NOTE — Addendum Note (Signed)
Addended by: Remus Blake on: 07/11/2012 02:34 PM   Modules accepted: Orders

## 2012-07-12 ENCOUNTER — Encounter: Payer: Self-pay | Admitting: Internal Medicine

## 2012-07-15 ENCOUNTER — Emergency Department (HOSPITAL_COMMUNITY): Payer: Medicare Other

## 2012-07-15 ENCOUNTER — Encounter (HOSPITAL_COMMUNITY): Payer: Self-pay

## 2012-07-15 ENCOUNTER — Inpatient Hospital Stay (HOSPITAL_COMMUNITY)
Admission: EM | Admit: 2012-07-15 | Discharge: 2012-07-19 | DRG: 917 | Disposition: A | Discharge status: Home / Self Care | Payer: Medicare Other | Attending: Internal Medicine | Admitting: Internal Medicine

## 2012-07-15 DIAGNOSIS — I472 Ventricular tachycardia, unspecified: Secondary | ICD-10-CM | POA: Diagnosis not present

## 2012-07-15 DIAGNOSIS — I447 Left bundle-branch block, unspecified: Secondary | ICD-10-CM | POA: Diagnosis present

## 2012-07-15 DIAGNOSIS — Z5900 Homelessness unspecified: Secondary | ICD-10-CM

## 2012-07-15 DIAGNOSIS — F1411 Cocaine abuse, in remission: Secondary | ICD-10-CM | POA: Diagnosis present

## 2012-07-15 DIAGNOSIS — I129 Hypertensive chronic kidney disease with stage 1 through stage 4 chronic kidney disease, or unspecified chronic kidney disease: Secondary | ICD-10-CM | POA: Diagnosis present

## 2012-07-15 DIAGNOSIS — F3289 Other specified depressive episodes: Secondary | ICD-10-CM | POA: Diagnosis present

## 2012-07-15 DIAGNOSIS — K219 Gastro-esophageal reflux disease without esophagitis: Secondary | ICD-10-CM | POA: Diagnosis present

## 2012-07-15 DIAGNOSIS — I5023 Acute on chronic systolic (congestive) heart failure: Secondary | ICD-10-CM | POA: Diagnosis present

## 2012-07-15 DIAGNOSIS — Z9119 Patient's noncompliance with other medical treatment and regimen: Secondary | ICD-10-CM

## 2012-07-15 DIAGNOSIS — I10 Essential (primary) hypertension: Secondary | ICD-10-CM | POA: Diagnosis present

## 2012-07-15 DIAGNOSIS — Z91199 Patient's noncompliance with other medical treatment and regimen due to unspecified reason: Secondary | ICD-10-CM

## 2012-07-15 DIAGNOSIS — F1010 Alcohol abuse, uncomplicated: Secondary | ICD-10-CM | POA: Diagnosis present

## 2012-07-15 DIAGNOSIS — I428 Other cardiomyopathies: Secondary | ICD-10-CM | POA: Diagnosis present

## 2012-07-15 DIAGNOSIS — I514 Myocarditis, unspecified: Secondary | ICD-10-CM | POA: Diagnosis present

## 2012-07-15 DIAGNOSIS — I5022 Chronic systolic (congestive) heart failure: Secondary | ICD-10-CM | POA: Diagnosis present

## 2012-07-15 DIAGNOSIS — I5032 Chronic diastolic (congestive) heart failure: Secondary | ICD-10-CM

## 2012-07-15 DIAGNOSIS — I214 Non-ST elevation (NSTEMI) myocardial infarction: Secondary | ICD-10-CM | POA: Diagnosis present

## 2012-07-15 DIAGNOSIS — N183 Chronic kidney disease, stage 3 unspecified: Secondary | ICD-10-CM | POA: Diagnosis present

## 2012-07-15 DIAGNOSIS — T43601A Poisoning by unspecified psychostimulants, accidental (unintentional), initial encounter: Secondary | ICD-10-CM | POA: Diagnosis present

## 2012-07-15 DIAGNOSIS — E1129 Type 2 diabetes mellitus with other diabetic kidney complication: Secondary | ICD-10-CM | POA: Diagnosis present

## 2012-07-15 DIAGNOSIS — F1410 Cocaine abuse, uncomplicated: Secondary | ICD-10-CM | POA: Diagnosis present

## 2012-07-15 DIAGNOSIS — E875 Hyperkalemia: Secondary | ICD-10-CM | POA: Diagnosis present

## 2012-07-15 DIAGNOSIS — R079 Chest pain, unspecified: Secondary | ICD-10-CM

## 2012-07-15 DIAGNOSIS — G7000 Myasthenia gravis without (acute) exacerbation: Secondary | ICD-10-CM | POA: Diagnosis present

## 2012-07-15 DIAGNOSIS — Z8711 Personal history of peptic ulcer disease: Secondary | ICD-10-CM

## 2012-07-15 DIAGNOSIS — G4733 Obstructive sleep apnea (adult) (pediatric): Secondary | ICD-10-CM | POA: Diagnosis present

## 2012-07-15 DIAGNOSIS — N058 Unspecified nephritic syndrome with other morphologic changes: Secondary | ICD-10-CM | POA: Diagnosis present

## 2012-07-15 DIAGNOSIS — I2510 Atherosclerotic heart disease of native coronary artery without angina pectoris: Secondary | ICD-10-CM | POA: Diagnosis present

## 2012-07-15 DIAGNOSIS — F329 Major depressive disorder, single episode, unspecified: Secondary | ICD-10-CM | POA: Diagnosis present

## 2012-07-15 DIAGNOSIS — I4729 Other ventricular tachycardia: Secondary | ICD-10-CM | POA: Diagnosis not present

## 2012-07-15 DIAGNOSIS — E669 Obesity, unspecified: Secondary | ICD-10-CM | POA: Diagnosis present

## 2012-07-15 DIAGNOSIS — T405X1A Poisoning by cocaine, accidental (unintentional), initial encounter: Principal | ICD-10-CM | POA: Diagnosis present

## 2012-07-15 DIAGNOSIS — Z87891 Personal history of nicotine dependence: Secondary | ICD-10-CM

## 2012-07-15 DIAGNOSIS — I509 Heart failure, unspecified: Secondary | ICD-10-CM | POA: Diagnosis present

## 2012-07-15 DIAGNOSIS — E785 Hyperlipidemia, unspecified: Secondary | ICD-10-CM | POA: Diagnosis present

## 2012-07-15 DIAGNOSIS — Z794 Long term (current) use of insulin: Secondary | ICD-10-CM

## 2012-07-15 DIAGNOSIS — Z95810 Presence of automatic (implantable) cardiac defibrillator: Secondary | ICD-10-CM

## 2012-07-15 DIAGNOSIS — N289 Disorder of kidney and ureter, unspecified: Secondary | ICD-10-CM

## 2012-07-15 LAB — CBC WITH DIFFERENTIAL/PLATELET
HCT: 43.5 % (ref 39.0–52.0)
Hemoglobin: 14.7 g/dL (ref 13.0–17.0)
Lymphocytes Relative: 15 % (ref 12–46)
Lymphs Abs: 0.8 10*3/uL (ref 0.7–4.0)
MCHC: 33.8 g/dL (ref 30.0–36.0)
Monocytes Absolute: 0.3 10*3/uL (ref 0.1–1.0)
Monocytes Relative: 6 % (ref 3–12)
Neutro Abs: 3.5 10*3/uL (ref 1.7–7.7)
Neutrophils Relative %: 66 % (ref 43–77)
RBC: 5.2 MIL/uL (ref 4.22–5.81)
WBC: 5.4 10*3/uL (ref 4.0–10.5)

## 2012-07-15 LAB — URINALYSIS, ROUTINE W REFLEX MICROSCOPIC
Bilirubin Urine: NEGATIVE
Ketones, ur: NEGATIVE mg/dL
Leukocytes, UA: NEGATIVE
Nitrite: NEGATIVE
Protein, ur: 100 mg/dL — AB
pH: 6 (ref 5.0–8.0)

## 2012-07-15 LAB — POCT I-STAT TROPONIN I

## 2012-07-15 LAB — COMPREHENSIVE METABOLIC PANEL
Albumin: 3.1 g/dL — ABNORMAL LOW (ref 3.5–5.2)
Alkaline Phosphatase: 80 U/L (ref 39–117)
BUN: 30 mg/dL — ABNORMAL HIGH (ref 6–23)
CO2: 25 mEq/L (ref 19–32)
Chloride: 99 mEq/L (ref 96–112)
Creatinine, Ser: 1.86 mg/dL — ABNORMAL HIGH (ref 0.50–1.35)
GFR calc non Af Amer: 42 mL/min — ABNORMAL LOW (ref >90)
Glucose, Bld: 201 mg/dL — ABNORMAL HIGH (ref 70–99)
Potassium: 6.3 mEq/L (ref 3.5–5.1)
Total Bilirubin: 1.4 mg/dL — ABNORMAL HIGH (ref 0.3–1.2)

## 2012-07-15 LAB — MRSA PCR SCREENING: MRSA by PCR: NEGATIVE

## 2012-07-15 LAB — GLUCOSE, CAPILLARY: Glucose-Capillary: 137 mg/dL — ABNORMAL HIGH (ref 70–99)

## 2012-07-15 LAB — BASIC METABOLIC PANEL
CO2: 28 mEq/L (ref 19–32)
Calcium: 8.9 mg/dL (ref 8.4–10.5)
GFR calc non Af Amer: 43 mL/min — ABNORMAL LOW (ref >90)
Potassium: 3.6 mEq/L (ref 3.5–5.1)
Sodium: 138 mEq/L (ref 135–145)

## 2012-07-15 LAB — PRO B NATRIURETIC PEPTIDE: Pro B Natriuretic peptide (BNP): 4881 pg/mL — ABNORMAL HIGH (ref 0–125)

## 2012-07-15 LAB — RAPID URINE DRUG SCREEN, HOSP PERFORMED
Barbiturates: NOT DETECTED
Cocaine: POSITIVE — AB
Tetrahydrocannabinol: NOT DETECTED

## 2012-07-15 LAB — HEPARIN LEVEL (UNFRACTIONATED): Heparin Unfractionated: 0.15 IU/mL — ABNORMAL LOW (ref 0.30–0.70)

## 2012-07-15 LAB — URINE MICROSCOPIC-ADD ON

## 2012-07-15 LAB — TROPONIN I: Troponin I: 15.17 ng/mL (ref <0.30)

## 2012-07-15 MED ORDER — TORSEMIDE 20 MG PO TABS
60.0000 mg | ORAL_TABLET | Freq: Every day | ORAL | Status: DC
  Administered 2012-07-16: 60 mg via ORAL
  Filled 2012-07-15 (×2): qty 3

## 2012-07-15 MED ORDER — NITROGLYCERIN 0.4 MG SL SUBL
SUBLINGUAL_TABLET | SUBLINGUAL | Status: AC
  Administered 2012-07-15: 5 mg
  Filled 2012-07-15: qty 75

## 2012-07-15 MED ORDER — CALCIUM GLUCONATE 10 % IV SOLN
1.0000 g | Freq: Once | INTRAVENOUS | Status: AC
  Administered 2012-07-15: 1 g via INTRAVENOUS
  Filled 2012-07-15: qty 10

## 2012-07-15 MED ORDER — NITROGLYCERIN IN D5W 200-5 MCG/ML-% IV SOLN
2.0000 ug/min | INTRAVENOUS | Status: DC
  Administered 2012-07-16: 30 ug/min via INTRAVENOUS
  Filled 2012-07-15 (×2): qty 250

## 2012-07-15 MED ORDER — HEPARIN (PORCINE) IN NACL 100-0.45 UNIT/ML-% IJ SOLN
1750.0000 [IU]/h | INTRAMUSCULAR | Status: DC
  Administered 2012-07-16 (×2): 1600 [IU]/h via INTRAVENOUS
  Filled 2012-07-15 (×8): qty 250

## 2012-07-15 MED ORDER — DIPHENHYDRAMINE HCL 50 MG/ML IJ SOLN
25.0000 mg | Freq: Once | INTRAMUSCULAR | Status: AC
  Administered 2012-07-15: 25 mg via INTRAVENOUS
  Filled 2012-07-15: qty 1

## 2012-07-15 MED ORDER — ONDANSETRON HCL 4 MG PO TABS: 4.0000 mg | ORAL_TABLET | Freq: Four times a day (QID) | ORAL | Status: DC | PRN

## 2012-07-15 MED ORDER — SENNA 8.6 MG PO TABS
1.0000 | ORAL_TABLET | Freq: Two times a day (BID) | ORAL | Status: DC
  Administered 2012-07-15 – 2012-07-19 (×6): 8.6 mg via ORAL
  Filled 2012-07-15 (×9): qty 1

## 2012-07-15 MED ORDER — ONDANSETRON HCL 4 MG/2ML IJ SOLN
4.0000 mg | Freq: Once | INTRAMUSCULAR | Status: AC
  Administered 2012-07-15: 4 mg via INTRAVENOUS
  Filled 2012-07-15: qty 2

## 2012-07-15 MED ORDER — INSULIN ASPART 100 UNIT/ML ~~LOC~~ SOLN
0.0000 [IU] | SUBCUTANEOUS | Status: DC
  Administered 2012-07-15: 3 [IU] via SUBCUTANEOUS

## 2012-07-15 MED ORDER — DIGOXIN 125 MCG PO TABS
0.1250 mg | ORAL_TABLET | Freq: Every day | ORAL | Status: DC
  Administered 2012-07-16 – 2012-07-19 (×4): 0.125 mg via ORAL
  Filled 2012-07-15 (×4): qty 1

## 2012-07-15 MED ORDER — SODIUM POLYSTYRENE SULFONATE 15 GM/60ML PO SUSP
60.0000 g | Freq: Once | ORAL | Status: AC
  Administered 2012-07-15: 60 g via ORAL
  Filled 2012-07-15: qty 240

## 2012-07-15 MED ORDER — INSULIN ASPART 100 UNIT/ML ~~LOC~~ SOLN
0.0000 [IU] | Freq: Three times a day (TID) | SUBCUTANEOUS | Status: DC
  Administered 2012-07-16: 2 [IU] via SUBCUTANEOUS
  Administered 2012-07-16: 3 [IU] via SUBCUTANEOUS
  Administered 2012-07-16 – 2012-07-17 (×3): 2 [IU] via SUBCUTANEOUS
  Administered 2012-07-18: 3 [IU] via SUBCUTANEOUS
  Administered 2012-07-18 (×2): 2 [IU] via SUBCUTANEOUS
  Administered 2012-07-19: 3 [IU] via SUBCUTANEOUS
  Administered 2012-07-19: 2 [IU] via SUBCUTANEOUS

## 2012-07-15 MED ORDER — MORPHINE SULFATE 4 MG/ML IJ SOLN
4.0000 mg | Freq: Once | INTRAMUSCULAR | Status: AC
  Administered 2012-07-15: 4 mg via INTRAVENOUS
  Filled 2012-07-15: qty 1

## 2012-07-15 MED ORDER — METOLAZONE 2.5 MG PO TABS
2.5000 mg | ORAL_TABLET | Freq: Every day | ORAL | Status: DC
  Administered 2012-07-16: 2.5 mg via ORAL
  Filled 2012-07-15 (×2): qty 1

## 2012-07-15 MED ORDER — ASPIRIN EC 81 MG PO TBEC
81.0000 mg | DELAYED_RELEASE_TABLET | Freq: Every day | ORAL | Status: DC
  Administered 2012-07-16 – 2012-07-19 (×4): 81 mg via ORAL
  Filled 2012-07-15 (×4): qty 1

## 2012-07-15 MED ORDER — PANTOPRAZOLE SODIUM 40 MG PO TBEC
40.0000 mg | DELAYED_RELEASE_TABLET | Freq: Every day | ORAL | Status: DC
  Administered 2012-07-16 – 2012-07-19 (×4): 40 mg via ORAL
  Filled 2012-07-15 (×4): qty 1

## 2012-07-15 MED ORDER — SODIUM BICARBONATE 8.4 % IV SOLN
50.0000 meq | Freq: Once | INTRAVENOUS | Status: AC
  Administered 2012-07-15: 50 meq via INTRAVENOUS
  Filled 2012-07-15: qty 50

## 2012-07-15 MED ORDER — CARVEDILOL 3.125 MG PO TABS
3.1250 mg | ORAL_TABLET | Freq: Two times a day (BID) | ORAL | Status: DC
  Filled 2012-07-15 (×2): qty 1

## 2012-07-15 MED ORDER — HEPARIN SODIUM (PORCINE) 5000 UNIT/ML IJ SOLN
5000.0000 [IU] | Freq: Three times a day (TID) | INTRAMUSCULAR | Status: DC
Start: 2012-07-15 — End: 2012-07-15
  Filled 2012-07-15 (×2): qty 1

## 2012-07-15 MED ORDER — HEPARIN BOLUS VIA INFUSION
4000.0000 [IU] | Freq: Once | INTRAVENOUS | Status: AC
  Administered 2012-07-15: 4000 [IU] via INTRAVENOUS
  Filled 2012-07-15: qty 4000

## 2012-07-15 MED ORDER — ONDANSETRON HCL 4 MG/2ML IJ SOLN: 4.0000 mg | Freq: Four times a day (QID) | INTRAMUSCULAR | Status: DC | PRN

## 2012-07-15 MED ORDER — MIRTAZAPINE 30 MG PO TABS
30.0000 mg | ORAL_TABLET | Freq: Every day | ORAL | Status: DC
  Administered 2012-07-15 – 2012-07-18 (×4): 30 mg via ORAL
  Filled 2012-07-15 (×5): qty 1

## 2012-07-15 MED ORDER — CLORAZEPATE DIPOTASSIUM 3.75 MG PO TABS
7.5000 mg | ORAL_TABLET | Freq: Every day | ORAL | Status: DC
  Administered 2012-07-16 – 2012-07-17 (×2): 7.5 mg via ORAL
  Filled 2012-07-15 (×2): qty 2
  Filled 2012-07-15: qty 1

## 2012-07-15 MED ORDER — NITROGLYCERIN 2 % TD OINT
1.0000 [in_us] | TOPICAL_OINTMENT | Freq: Once | TRANSDERMAL | Status: AC
  Administered 2012-07-15: 1 [in_us] via TOPICAL
  Filled 2012-07-15: qty 1

## 2012-07-15 MED ORDER — HEPARIN BOLUS VIA INFUSION
2000.0000 [IU] | Freq: Once | INTRAVENOUS | Status: AC
  Administered 2012-07-15: 2000 [IU] via INTRAVENOUS
  Filled 2012-07-15: qty 2000

## 2012-07-15 MED ORDER — INSULIN ASPART 100 UNIT/ML ~~LOC~~ SOLN
10.0000 [IU] | Freq: Once | SUBCUTANEOUS | Status: AC
  Administered 2012-07-15: 10 [IU] via INTRAVENOUS
  Filled 2012-07-15: qty 1

## 2012-07-15 MED ORDER — BUMETANIDE 0.25 MG/ML IJ SOLN
1.0000 mg | Freq: Once | INTRAMUSCULAR | Status: AC
  Administered 2012-07-15: 1 mg via INTRAVENOUS
  Filled 2012-07-15: qty 4

## 2012-07-15 MED ORDER — SODIUM CHLORIDE 0.9 % IJ SOLN
3.0000 mL | Freq: Two times a day (BID) | INTRAMUSCULAR | Status: DC
  Administered 2012-07-17 – 2012-07-18 (×3): 3 mL via INTRAVENOUS

## 2012-07-15 MED ORDER — MORPHINE SULFATE 2 MG/ML IJ SOLN: 1.0000 mg | INTRAMUSCULAR | Status: DC | PRN

## 2012-07-15 MED ORDER — DEXTROSE 50 % IV SOLN
25.0000 g | Freq: Once | INTRAVENOUS | Status: AC
  Administered 2012-07-15: 25 g via INTRAVENOUS
  Filled 2012-07-15: qty 50

## 2012-07-15 MED ORDER — DOCUSATE SODIUM 100 MG PO CAPS
100.0000 mg | ORAL_CAPSULE | Freq: Two times a day (BID) | ORAL | Status: DC
  Administered 2012-07-15 – 2012-07-19 (×7): 100 mg via ORAL
  Filled 2012-07-15 (×9): qty 1

## 2012-07-15 NOTE — Progress Notes (Signed)
ANTICOAGULATION CONSULT NOTE Pharmacy Consult for heparin Indication: chest pain/ACS  Allergies  Allergen Reactions  . Shellfish Allergy Anaphylaxis  . Adhesive (Tape) Hives  . Tylenol (Acetaminophen) Nausea Only  . Zoloft (Sertraline Hcl) Other (See Comments)    Talked to people in his sleep May 2013    Patient Measurements: Height: 6' (182.9 cm) Weight: 228 lb 9.9 oz (103.7 kg) (scale b) IBW/kg (Calculated) : 77.6 Heparin Dosing Weight: 99kg  Vital Signs: Temp: 97.8 F (36.6 C) (06/28 1951) Temp src: Oral (06/28 1951) BP: 120/92 mmHg (06/28 2200) Pulse Rate: 97 (06/28 2200)  Labs:  Recent Labs  07/15/12 0925 07/15/12 1330 07/15/12 1432 07/15/12 2243  HGB 14.7  --   --   --   HCT 43.5  --   --   --   PLT 229  --   --   --   HEPARINUNFRC  --   --   --  0.15*  CREATININE 1.86* 1.83*  --   --   TROPONINI  --   --  15.17*  --     Estimated Creatinine Clearance: 64.1 ml/min (by C-G formula based on Cr of 1.83).  Assessment: 47 yo male with chest pain for heparin  Goal of Therapy:  Heparin level 0.3-0.7 units/ml Monitor platelets by anticoagulation protocol: Yes   Plan:  Heparin 2000 units IV bolus, then increase heparin 1600 units/hr Follow-up am labs.  Eddie Candle 07/15/2012,11:21 PM

## 2012-07-15 NOTE — ED Provider Notes (Signed)
History    CSN: [id number] Arrival date & time 07/15/12  0805  First MD Initiated Contact with Patient 07/15/12 0805     Chief Complaint  Patient presents with  . Chest Pain   (Consider location/radiation/quality/duration/timing/severity/associated sxs/prior Treatment) HPI  Patient has a history of cardiomyopathy and congestive heart failure. He states his ejection fraction is 10%. He had a defibrillator placed last year. He states about 6:45 AM this morning he was awakened up with a central chest pain. He states it feels like a pressure pain or "something sitting on me". He states he broke out in a sweat and got short of breath. He states his left arm feels numb. He states he has been having swelling of his lower legs for the past several weeks area he has had nausea without vomiting. He was brought to the emergency department by EMS and received aspirin and nitroglycerin x3. His pain has improved to a 7/10. Patient does report being under a lot of stress, he has 3 children and is living in a shelter. He states he has been seen weekly either by his cardiologist or his primary care doctor trying to get his swelling in his legs controlled. He states he has had a cardiac catheterization and that he does not have any blockages in his arteries.   PCP Dr Loistine Chance, [facility] Cardiologist Dr Jones Broom  Past Medical History  Diagnosis Date  . Hypertension   . 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 History of peptic ulcer disease     EGD 09/2007 shows prox gastric ulcer with black eschar, treated with PPI  . Depression   . History of alcohol abuse     quit approx 2011-2012  . Angina   . Shortness of breath   . OSA (obstructive sleep apnea)     sleeps with oxygen  . Cardiomyopathy   . ICD (implantable cardiac defibrillator) in place   . Diabetes mellitus     insulin dependent  . GERD  (gastroesophageal reflux disease)   . Headache(784.0)   . Myasthenia gravis 10/12/2011    Diagnosed in 09/2011. Received Plasmapheresis. CT chest no Thymoma. Acetylcholine receptor antibody negative.    . CKD (chronic kidney disease) stage 3, GFR 30-59 ml/min   . Myasthenia gravis   . Myasthenia gravis    Past Surgical History  Procedure Laterality Date  . Cardiac defibrillator placement  04/2007  . Knee surgery    . Skin grafts    . Facial reconstruction surgery    . Insertion of dialysis catheter  10/12/2011    temporary    Family History  Problem Relation Age of Onset  . Heart failure Mother   . Hypertension Mother   . Sarcoidosis Mother   . Diabetes type II Mother   . Heart attack Brother    History  Substance Use Topics  . Smoking status: Former Smoker    Types: Cigars    Quit date: 07/27/2007  . Smokeless tobacco: Former Neurosurgeon  . Alcohol Use: No     Comment: no alcohol since 2012  living in a shelter  Review of Systems  All other systems reviewed and are negative.    Allergies  Shellfish allergy; Adhesive; Tylenol; and Zoloft  Home Medications   Current Outpatient Rx  Name  Route  Sig  Dispense  Refill  . carvedilol (COREG) 3.125 MG tablet  Oral   Take 1 tablet (3.125 mg total) by mouth 2 (two) times daily with a meal. For heart problems/HTN         . clorazepate (TRANXENE) 7.5 MG tablet   Oral   Take 1 tablet (7.5 mg total) by mouth daily. Take three tablets by mouth three times a day (1 in the morning and 2 in the evening): For anxiety   30 tablet   3   . digoxin (LANOXIN) 0.125 MG tablet   Oral   Take 1 tablet (0.125 mg total) by mouth daily. For heart problems         . hydrALAZINE (APRESOLINE) 25 MG tablet   Oral   Take 1 tablet (25 mg total) by mouth 3 (three) times daily. For control of high blood pressure         . torsemide (DEMADEX) 20 MG tablet   Oral   Take 3 tablets (60 mg total) by mouth daily. For control of high blood  pressure/sewllings.         . hydrOXYzine (ATARAX/VISTARIL) 25 MG tablet   Oral   Take 1-2 tablets (25-50 mg total) by mouth 3 (three) times daily as needed for itching or anxiety. Take 1 or 2 tablets by mouth 3 times daily as needed for itching.(Max of 6 tablets a day): For anxiety/itching   30 tablet   0   . isosorbide mononitrate (IMDUR) 60 MG 24 hr tablet   Oral   Take 1 tablet (60 mg total) by mouth daily. For chronic chest pains         . metolazone (ZAROXOLYN) 2.5 MG tablet   Oral   Take 1 tablet (2.5 mg total) by mouth daily.   3 tablet   0   . mirtazapine (REMERON) 30 MG tablet   Oral   Take 1 tablet (30 mg total) by mouth at bedtime. For depression/sleep         . omeprazole (PRILOSEC) 20 MG capsule   Oral   Take 1 capsule (20 mg total) by mouth daily. For acid reflux         . potassium chloride (K-DUR) 10 MEQ tablet   Oral   Take 1 tablet (10 mEq total) by mouth daily. For low potassium         . rosuvastatin (CRESTOR) 10 MG tablet   Oral   Take 1 tablet (10 mg total) by mouth daily. For control of high cholesterol          BP 110/90  Pulse 100  Temp(Src) 97.7 F (36.5 C) (Oral)  Resp 20  SpO2 98%  Vital signs normal   Physical Exam  Nursing note and vitals reviewed. Constitutional: He is oriented to person, place, and time. He appears well-developed and well-nourished.  Non-toxic appearance. He does not appear ill. No distress.  HENT:  Head: Normocephalic and atraumatic.  Right Ear: External ear normal.  Left Ear: External ear normal.  Nose: Nose normal. No mucosal edema or rhinorrhea.  Mouth/Throat: Oropharynx is clear and moist and mucous membranes are normal. No dental abscesses or edematous.  Eyes: Conjunctivae and EOM are normal. Pupils are equal, round, and reactive to light.  Neck: Normal range of motion and full passive range of motion without pain. Neck supple.  Cardiovascular: Normal rate, regular rhythm and normal heart  sounds.  Exam reveals no gallop and no friction rub.   No murmur heard. Pulmonary/Chest: Effort normal and breath sounds normal. No respiratory distress. He  has no wheezes. He has no rhonchi. He has no rales. He exhibits no tenderness and no crepitus.  Mild decreased breath sounds diffusely  Abdominal: Soft. Normal appearance and bowel sounds are normal. He exhibits no distension. There is no tenderness. There is no rebound and no guarding.  Musculoskeletal: Normal range of motion. He exhibits edema. He exhibits no tenderness.  Moves all extremities well. 1+ to trace pitting edema of both lower extremities  Neurological: He is alert and oriented to person, place, and time. He has normal strength. No cranial nerve deficit.  Skin: Skin is warm, dry and intact. No rash noted. No erythema. No pallor.  Psychiatric: He has a normal mood and affect. His speech is normal and behavior is normal. His mood appears not anxious.    ED Course  Procedures (including critical care time)  Medications  sodium polystyrene (KAYEXALATE) 15 GM/60ML suspension 60 g (not administered)  calcium gluconate 1 g in sodium chloride 0.9 % 100 mL IVPB (not administered)  sodium bicarbonate injection 50 mEq (not administered)  dextrose 50 % solution 25 g (not administered)  insulin aspart (novoLOG) injection 10 Units (not administered)  nitroGLYCERIN (NITROGLYN) 2 % ointment 1 inch (1 inch Topical Given 07/15/12 0946)  morphine 4 MG/ML injection 4 mg (4 mg Intravenous Given 07/15/12 0949)  ondansetron (ZOFRAN) injection 4 mg (4 mg Intravenous Given 07/15/12 0949)  bumetanide (BUMEX) injection 1 mg (1 mg Intravenous Given 07/15/12 1022)   Patient was given IV pain medicine  Patient was treated with his hyperkalemia with Kayexalate and IV calcium, bicarbonate, dextrose, and insulin.  11:15 Dr Dorise Hiss, [facility], admit to tele, attending Dr Kem Kays    Review of old charts  I found a reference to a cardiac catheterization in  2008. He had a 40% left circumflex blockage at that time.  Echo 2012-03-23 ------------------------------------------------------------ Study Conclusions  - Left ventricle: The cavity size was severely dilated. Wall thickness was increased in a pattern of mild LVH. The estimated ejection fraction was 15%. Diffuse hypokinesis. Doppler parameters are consistent with high ventricular filling pressure. - Left atrium: The atrium was mildly dilated. - Right ventricle: The cavity size was moderately dilated. Systolic function was mildly to moderately reduced. - Right atrium: The atrium was mildly dilated. - Pulmonary arteries: PA peak pressure: 45mm Hg (S). Transthoracic echocardiography. M-mode, complete 2D, spectral Doppler, and color Doppler. Height: Height: 182.9cm. Height: 72in. Weight: Weight: 100.9kg. Weight: 222lb. Body mass index: BMI: 30.2kg/m^2. Body surface area: BSA: 2.81m^2. Blood pressure: 121/85. Patient status: Inpatient. Location: ICU/CCU  Prepared and Electronically Authenticated by  Cassell Clement 2014-03-06T20:[id number]   Results for orders placed during the hospital encounter of 07/15/12  URINE RAPID DRUG SCREEN (HOSP PERFORMED)      Result Value Range   Opiates NONE DETECTED  NONE DETECTED   Cocaine POSITIVE (*) NONE DETECTED   Benzodiazepines NONE DETECTED  NONE DETECTED   Amphetamines NONE DETECTED  NONE DETECTED   Tetrahydrocannabinol NONE DETECTED  NONE DETECTED   Barbiturates NONE DETECTED  NONE DETECTED  CBC WITH DIFFERENTIAL      Result Value Range   WBC 5.4  4.0 - 10.5 K/uL   RBC 5.20  4.22 - 5.81 MIL/uL   Hemoglobin 14.7  13.0 - 17.0 g/dL   HCT 16.1  09.6 - 04.5 %   MCV 83.7  78.0 - 100.0 fL   MCH 28.3  26.0 - 34.0 pg   MCHC 33.8  30.0 - 36.0 g/dL   RDW 40.9 (*)  11.5 - 15.5 %   Platelets 229  150 - 400 K/uL   Neutrophils Relative % 66  43 - 77 %   Neutro Abs 3.5  1.7 - 7.7 K/uL   Lymphocytes Relative 15  12 - 46 %   Lymphs Abs 0.8  0.7 -  4.0 K/uL   Monocytes Relative 6  3 - 12 %   Monocytes Absolute 0.3  0.1 - 1.0 K/uL   Eosinophils Relative 12 (*) 0 - 5 %   Eosinophils Absolute 0.7  0.0 - 0.7 K/uL   Basophils Relative 1  0 - 1 %   Basophils Absolute 0.1  0.0 - 0.1 K/uL  COMPREHENSIVE METABOLIC PANEL      Result Value Range   Sodium 132 (*) 135 - 145 mEq/L   Potassium 6.3 (*) 3.5 - 5.1 mEq/L   Chloride 99  96 - 112 mEq/L   CO2 25  19 - 32 mEq/L   Glucose, Bld 201 (*) 70 - 99 mg/dL   BUN 30 (*) 6 - 23 mg/dL   Creatinine, Ser 1.61 (*) 0.50 - 1.35 mg/dL   Calcium 8.7  8.4 - 09.6 mg/dL   Total Protein 6.9  6.0 - 8.3 g/dL   Albumin 3.1 (*) 3.5 - 5.2 g/dL   AST 20  0 - 37 U/L   ALT 12  0 - 53 U/L   Alkaline Phosphatase 80  39 - 117 U/L   Total Bilirubin 1.4 (*) 0.3 - 1.2 mg/dL   GFR calc non Af Amer 42 (*) >90 mL/min   GFR calc Af Amer 49 (*) >90 mL/min  PRO B NATRIURETIC PEPTIDE      Result Value Range   Pro B Natriuretic peptide (BNP) 4881.0 (*) 0 - 125 pg/mL  URINALYSIS, ROUTINE W REFLEX MICROSCOPIC      Result Value Range   Color, Urine YELLOW  YELLOW   APPearance CLEAR  CLEAR   Specific Gravity, Urine 1.013  1.005 - 1.030   pH 6.0  5.0 - 8.0   Glucose, UA NEGATIVE  NEGATIVE mg/dL   Hgb urine dipstick TRACE (*) NEGATIVE   Bilirubin Urine NEGATIVE  NEGATIVE   Ketones, ur NEGATIVE  NEGATIVE mg/dL   Protein, ur 045 (*) NEGATIVE mg/dL   Urobilinogen, UA 1.0  0.0 - 1.0 mg/dL   Nitrite NEGATIVE  NEGATIVE   Leukocytes, UA NEGATIVE  NEGATIVE  DIGOXIN LEVEL      Result Value Range   Digoxin Level 0.3 (*) 0.8 - 2.0 ng/mL  URINE MICROSCOPIC-ADD ON      Result Value Range   RBC / HPF 0-2  <3 RBC/hpf   Casts HYALINE CASTS (*) NEGATIVE  POCT I-STAT TROPONIN I      Result Value Range   Troponin i, poc 0.09 (*) 0.00 - 0.08 ng/mL   Comment NOTIFIED PHYSICIAN     Comment 3            Laboratory interpretation all normal except for stable renal insufficiency, hyperkalemia, positive urine drug screen, stable elevated  BNP, mildly elevated troponin   Dg Chest Portable 1 View  07/15/2012   *RADIOLOGY REPORT*  Clinical Data: Chest pain.  PORTABLE CHEST - 1 VIEW  Comparison: 06/16/2012.  Findings: The heart is mildly enlarged.  There is a right ventricular ICD pacer wire with left-sided generator.  The mediastinal and hilar contours are normal.  There is mild, generalized diffuse interstitial thickening.  No alveolar disease. No pneumothoraces or evident effusions.  IMPRESSION: Cardiomegaly and interstitial thickening consistent with congestive heart failure.   Original Report Authenticated By: Sander Radon, M.D.     Date: 07/15/2012  Rate: 100  Rhythm: sinus tachycardia  QRS Axis: right  Intervals: QT prolonged  ST/T Wave abnormalities: nonspecific T wave changes  Conduction Disutrbances:none  Narrative Interpretation:   Old EKG Reviewed: unchanged from 06/16/2012     1. Chest pain at rest   2. Cocaine abuse   3. CHF (congestive heart failure), chronic, diastolic   4. Hyperkalemia   5. Renal insufficiency     Plan admission   Devoria Albe, MD, FACEP   CRITICAL CARE Performed by: Devoria Albe L Total critical care time: 32 min  Critical care time was exclusive of separately billable procedures and treating other patients. Critical care was necessary to treat or prevent imminent or life-threatening deterioration. Critical care was time spent personally by me on the following activities: development of treatment plan with patient and/or surrogate as well as nursing, discussions with consultants, evaluation of patient's response to treatment, examination of patient, obtaining history from patient or surrogate, ordering and performing treatments and interventions, ordering and review of laboratory studies, ordering and review of radiographic studies, pulse oximetry and re-evaluation of patient's condition.   MDM    Ward Givens, MD 07/15/12 1124

## 2012-07-15 NOTE — ED Notes (Signed)
Patient states he is continuing to have chest pain and numbness in left arm. Patient states the tightness in his chest awaken him from sleep. Patient continues to be on cardiac monitor and 2L oxygen with saturations of 100% on 2L oxygen.

## 2012-07-15 NOTE — ED Notes (Signed)
Called report caleed to Ty, RN on 4700. Patient being transported to 4740.
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
## 2012-07-15 NOTE — ED Notes (Signed)
MD Knapp at bedside 

## 2012-07-15 NOTE — ED Notes (Signed)
Per [facility] pt from home, pt c/o new mid-sternum chest pain, tingling in Left arm, pt given ASA 325 mg and Nitro x3 en route, 20 g IV Left Hand, pain 7/10. Pt reports increased stress lately

## 2012-07-15 NOTE — ED Notes (Signed)
MD at bedside. 

## 2012-07-15 NOTE — Progress Notes (Signed)
SW spoke with Dr. Julieta Bellini who reported that patient was unsure of who was taking care of his kids ( Cousin??). MD asked SW to investigate further into where the patient's kids are located. SW contacted the patient to discuss the location of his kids and the patient became irrate and stated, "I know where my fucking kids are, why are you asking me these questions". Patient continued to escalate and stated, "I want to speak with the MD right now or I am ripping my IV out right now and walking out of this building".  SW reinforced that the patient remain calm. SW paged the MD to inform her of this. Clinical Social Worker will sign off for now as social work intervention is no longer needed. Please consult [place] again if new need arises.   Sabino Niemann, MSW, [phone]

## 2012-07-15 NOTE — Progress Notes (Signed)
Patient with persistent complaints of left sided chest pain, initially rated pain at 6/10 nonradiating as well as had a 6 beat run of vtach on telemetry monitor. Patient denied any other associated symptoms. Over the course of 15 minutes nitroglycerin drip titrated up to , oxygen applied at 4 liters/nasal cannula. 12 leak EKG without any acute changes. Patient reports pain decreased to 1/10 after the above interventions and made nurse aware that he has not been completely pain free since presenting to hospital. Dr Mayford Knife notified and provided with an update. No signs of obvious acute distress noted during the episode.  No new orders obtained. 12 lead EKG placed on patient's chart, vital signs as per noted.

## 2012-07-15 NOTE — Progress Notes (Signed)
Dr Burtis Junes notified of patient having frequent runs of nonsustained vtach on telemetry monitor. Patient resting quietly in bed without any complaints at this time. Denies chest pain or shortness of breath at present time. New orders placed per physician.

## 2012-07-15 NOTE — Progress Notes (Signed)
ANTICOAGULATION CONSULT NOTE - Initial Consult  Pharmacy Consult for heparin Indication: chest pain/ACS  Allergies  Allergen Reactions  . Shellfish Allergy Anaphylaxis  . Adhesive (Tape) Hives  . Tylenol (Acetaminophen) Nausea Only  . Zoloft (Sertraline Hcl) Other (See Comments)    Talked to people in his sleep May 2013    Patient Measurements: Height: 6' (182.9 cm) Weight: 228 lb 9.9 oz (103.7 kg) (scale b) IBW/kg (Calculated) : 77.6 Heparin Dosing Weight: 99kg  Vital Signs: Temp: 97.7 F (36.5 C) (06/28 0817) Temp src: Oral (06/28 1318) BP: 119/84 mmHg (06/28 1318) Pulse Rate: 85 (06/28 1318)  Labs:  Recent Labs  07/15/12 0925 07/15/12 1330 07/15/12 1432  HGB 14.7  --   --   HCT 43.5  --   --   PLT 229  --   --   CREATININE 1.86* 1.83*  --   TROPONINI  --   --  15.17*    Estimated Creatinine Clearance: 64.1 ml/min (by C-G formula based on Cr of 1.83).   Medical History: Past Medical History  Diagnosis Date  . Hypertension   . 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 History of peptic ulcer disease     EGD 09/2007 shows prox gastric ulcer with black eschar, treated with PPI  . Depression   . History of alcohol abuse     quit approx 2011-2012  . Angina   . Shortness of breath   . OSA (obstructive sleep apnea)     sleeps with oxygen  . Cardiomyopathy   . ICD (implantable cardiac defibrillator) in place   . Diabetes mellitus     insulin dependent  . GERD (gastroesophageal reflux disease)   . Headache(784.0)   . Myasthenia gravis 10/12/2011    Diagnosed in 09/2011. Received Plasmapheresis. CT chest no Thymoma. Acetylcholine receptor antibody negative.    . CKD (chronic kidney disease) stage 3, GFR 30-59 ml/min   . Myasthenia gravis   . Myasthenia gravis     Medications:  Infusions:  . heparin      Assessment: 46 yom presented to the hospital with CP.  CBC is WNL, he is on no anticoagulants PTA. Troponin is elevated. To start IV heparin.   Goal of Therapy:  Heparin level 0.3-0.7 units/ml Monitor platelets by anticoagulation protocol: Yes   Plan:  1. Heparin bolus 4000 units IV x 1 2. Heparin gtt 1200 units/hr 3. Check a 6 hour heparin level 4. Daily heparin level and CBC 5. DC heparin SQ  [place] Gadbois, Drake Leach 07/15/2012,3:27 PM

## 2012-07-15 NOTE — ED Notes (Signed)
Waiting call back from floor nurse and then will transport to [id number].

## 2012-07-15 NOTE — ED Notes (Signed)
Pt undressed, in gown, on monitor, continuous pulse oximetry and blood pressure cuff 
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
## 2012-07-15 NOTE — Progress Notes (Signed)
CRITICAL VALUE ALERT  Critical value received:  Troponin 15.17   Date of notification:  07/15/2012  Time of notification:  3:18 PM   Critical value read back:yes  Nurse who received alert:  Desiree Lucy   MD notified (1st page):  Jonah Blue, DO   Time of first page:  3:18 PM   MD notified (2nd page):  Time of second page:  Responding MD:  Garald Braver  Time MD responded:  3:19 PM

## 2012-07-15 NOTE — ED Notes (Signed)
Lab called with critical high potassium 6.3. EDP advised.

## 2012-07-16 DIAGNOSIS — I5023 Acute on chronic systolic (congestive) heart failure: Secondary | ICD-10-CM

## 2012-07-16 DIAGNOSIS — E1129 Type 2 diabetes mellitus with other diabetic kidney complication: Secondary | ICD-10-CM

## 2012-07-16 DIAGNOSIS — I214 Non-ST elevation (NSTEMI) myocardial infarction: Secondary | ICD-10-CM

## 2012-07-16 LAB — CBC
MCH: 28.4 pg (ref 26.0–34.0)
MCV: 83.7 fL (ref 78.0–100.0)
Platelets: 235 10*3/uL (ref 150–400)
RDW: 16.2 % — ABNORMAL HIGH (ref 11.5–15.5)
WBC: 6.1 10*3/uL (ref 4.0–10.5)

## 2012-07-16 LAB — BASIC METABOLIC PANEL
CO2: 23 mEq/L (ref 19–32)
CO2: 25 mEq/L (ref 19–32)
Calcium: 8.6 mg/dL (ref 8.4–10.5)
Calcium: 8.7 mg/dL (ref 8.4–10.5)
Creatinine, Ser: 1.98 mg/dL — ABNORMAL HIGH (ref 0.50–1.35)
GFR calc Af Amer: 48 mL/min — ABNORMAL LOW (ref >90)
GFR calc non Af Amer: 39 mL/min — ABNORMAL LOW (ref >90)
GFR calc non Af Amer: 41 mL/min — ABNORMAL LOW (ref >90)
Sodium: 133 mEq/L — ABNORMAL LOW (ref 135–145)
Sodium: 133 mEq/L — ABNORMAL LOW (ref 135–145)

## 2012-07-16 LAB — GLUCOSE, CAPILLARY
Glucose-Capillary: 198 mg/dL — ABNORMAL HIGH (ref 70–99)
Glucose-Capillary: 201 mg/dL — ABNORMAL HIGH (ref 70–99)

## 2012-07-16 LAB — MAGNESIUM: Magnesium: 1.8 mg/dL (ref 1.5–2.5)

## 2012-07-16 LAB — TROPONIN I
Troponin I: >20 ng/mL (ref <0.30)
Troponin I: >20 ng/mL (ref <0.30)

## 2012-07-16 LAB — HEPARIN LEVEL (UNFRACTIONATED): Heparin Unfractionated: 0.51 IU/mL (ref 0.30–0.70)

## 2012-07-16 LAB — HEMOGLOBIN A1C: Mean Plasma Glucose: 180 mg/dL — ABNORMAL HIGH (ref <117)

## 2012-07-16 MED ORDER — SODIUM CHLORIDE 0.9 % IV SOLN: INTRAVENOUS | Status: DC

## 2012-07-16 NOTE — Progress Notes (Signed)
Pt with 10 beat run vtach. Strip in chart. Asleep/asymptomatic. Returned to NSR without intervention. MD made aware. Will continue to monitor and advise attending as needed.

## 2012-07-16 NOTE — Progress Notes (Signed)
Patient ID: [name], male   DOB: 07/04/1965, 47 y.o.   MRN: [id number] Resting comfortably this morning. Per Tim RN refusing troponin draws. I have canceled the them since it will not change what we're going to do. He is currently pain free. Meds reviewed and hemodynamically stable. He has had a few runs of nonsustained ventricular tachycardia but has a defibrillator. Assess for his magnesium 1.8.  I would make no changes in his medical program today. We can wean off his nitroglycerin and heparin tomorrow. I would keep him in step down.

## 2012-07-16 NOTE — Progress Notes (Signed)
ANTICOAGULATION CONSULT NOTE - Follow Up Consult  Pharmacy Consult for heparin Indication: chest pain/ACS  Allergies  Allergen Reactions  . Shellfish Allergy Anaphylaxis  . Adhesive (Tape) Hives  . Tylenol (Acetaminophen) Nausea Only  . Zoloft (Sertraline Hcl) Other (See Comments)    Talked to people in his sleep May 2013   Patient Measurements: Height: 6' (182.9 cm) Weight: 230 lb 9.6 oz (104.6 kg) IBW/kg (Calculated) : 77.6 Heparin Dosing Weight: 99kg  Vital Signs: Temp: 98 F (36.7 C) (06/29 0836) Temp src: Oral (06/29 0836) BP: 113/74 mmHg (06/29 0800) Pulse Rate: 104 (06/29 0836)  Labs:  Recent Labs  07/15/12 0925 07/15/12 1330 07/15/12 1432 07/15/12 2243 07/15/12 2355 07/16/12 0420  HGB 14.7  --   --   --   --  14.1  HCT 43.5  --   --   --   --  41.6  PLT 229  --   --   --   --  235  HEPARINUNFRC  --   --   --  0.15*  --  0.51  CREATININE 1.86* 1.83*  --   --  1.91* 1.98*  TROPONINI  --   --  15.17* >20.00*  --  >20.00*   Estimated Creatinine Clearance: 59.5 ml/min (by C-G formula based on Cr of 1.98).  Medical History: Past Medical History  Diagnosis Date  . Hypertension   . 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 History of peptic ulcer disease     EGD 09/2007 shows prox gastric ulcer with black eschar, treated with PPI  . Depression   . History of alcohol abuse     quit approx 2011-2012  . Angina   . Shortness of breath   . OSA (obstructive sleep apnea)     sleeps with oxygen  . ICD (implantable cardiac defibrillator) in place   . Diabetes mellitus     insulin dependent  . GERD (gastroesophageal reflux disease)   . Headache(784.0)   . Myasthenia gravis 10/12/2011    Diagnosed in 09/2011. Received Plasmapheresis. CT chest no Thymoma. Acetylcholine receptor antibody negative.    . CKD (chronic kidney disease) stage 3, GFR 30-59 ml/min   . Myasthenia  gravis    Medications:  Infusions:  . heparin 1,600 Units/hr (07/16/12 0505)  . nitroGLYCERIN 30 mcg/min (07/15/12 2035)   Assessment: 46 yom presented to the hospital with CP and found to have elevation of his troponin. CBC is WNL, he is on no anticoagulants PTA.   Heparin was started for ACS work-up and his level this morning is 0.51 on an IV rate of 1600 units/hr.  This is within desired goal range and he is without noted bleeding complications.  Goal of Therapy:  Heparin level 0.3-0.7 units/ml Monitor platelets by anticoagulation protocol: Yes   Plan:  1.  Continue IV Heparin at 1600 units/hr.   2.  Check AM heparin level 3.  Daily heparin level and CBC   Chinita Greenland 07/16/2012,8:50 AM

## 2012-07-16 NOTE — H&P (Signed)
Date: 07/16/2012  Patient name: [name]  Medical record number: [id number]  Date of birth: 1965/06/10   I have seen and evaluated Halford Decamp and discussed their care with the Residency Team.  46 yr. Old male w/ hx NICM EF 15%, s/p single chamber ICD 2009, CKD 3, HTN, OSA, IDDM type 2, HL, cocaine use, alcohol use, obesity Body mass index is 31.27 kg/(m^2)., presented due to CP. He states he had an episode of left sided pressure like CP associated with diaphoresis.  EKG revealed TWI in V5, V6, I, aVL. He had an initial Trop I 0.09.  He was started on heparin gtt, ASA, and NTG.  He was seen by cardiology. Overnight he had episodes of nonsustained vtach. His Trop I have values of over 20. He has remained hemodynamically stable.  He currently denies any CP.  Physical Exam: Blood pressure 113/74, pulse 104, temperature 98 F (36.7 C), temperature source Oral, resp. rate 26, height 6' (1.829 m), weight 230 lb 9.6 oz (104.6 kg), SpO2 100.00%. BP 113/74  Pulse 104  Temp(Src) 98 F (36.7 C) (Oral)  Resp 26  Ht 6' (1.829 m)  Wt 230 lb 9.6 oz (104.6 kg)  BMI 31.27 kg/m2  SpO2 100% General appearance: alert, cooperative and appears stated age Head: Normocephalic, without obvious abnormality, atraumatic Eyes: conjunctivae/corneas clear. PERRL, EOM's intact. Fundi benign. Lungs: clear to auscultation bilaterally Heart: regular rate and rhythm, S1, S2 normal, no murmur, click, rub or gallop Abdomen: soft, non-tender; bowel sounds normal; no masses,  no organomegaly Extremities: edema 1+, no c/c. Pulses: 2+ and symmetric Neurologic: Grossly normal  Lab results: Results for orders placed during the hospital encounter of 07/15/12 (from the past 24 hour(s))  GLUCOSE, CAPILLARY     Status: Abnormal   Collection Time    07/15/12  1:11 PM      Result Value Range   Glucose-Capillary 137 (*) 70 - 99 mg/dL   Comment 1 Notify RN    BASIC METABOLIC PANEL     Status: Abnormal   Collection Time      07/15/12  1:30 PM      Result Value Range   Sodium 138  135 - 145 mEq/L   Potassium 3.6  3.5 - 5.1 mEq/L   Chloride 100  96 - 112 mEq/L   CO2 28  19 - 32 mEq/L   Glucose, Bld 118 (*) 70 - 99 mg/dL   BUN 29 (*) 6 - 23 mg/dL   Creatinine, Ser 9.56 (*) 0.50 - 1.35 mg/dL   Calcium 8.9  8.4 - 21.3 mg/dL   GFR calc non Af Amer 43 (*) >90 mL/min   GFR calc Af Amer 50 (*) >90 mL/min  TROPONIN I     Status: Abnormal   Collection Time    07/15/12  2:32 PM      Result Value Range   Troponin I 15.17 (*) <0.30 ng/mL  MRSA PCR SCREENING     Status: None   Collection Time    07/15/12  3:42 PM      Result Value Range   MRSA by PCR NEGATIVE  NEGATIVE  GLUCOSE, CAPILLARY     Status: Abnormal   Collection Time    07/15/12  5:31 PM      Result Value Range   Glucose-Capillary 198 (*) 70 - 99 mg/dL   Comment 1 Notify RN    GLUCOSE, CAPILLARY     Status: Abnormal   Collection Time  07/15/12  9:28 PM      Result Value Range   Glucose-Capillary 212 (*) 70 - 99 mg/dL   Comment 1 Documented in Chart     Comment 2 Notify RN    TROPONIN I     Status: Abnormal   Collection Time    07/15/12 10:43 PM      Result Value Range   Troponin I >20.00 (*) <0.30 ng/mL  HEPARIN LEVEL (UNFRACTIONATED)     Status: Abnormal   Collection Time    07/15/12 10:43 PM      Result Value Range   Heparin Unfractionated 0.15 (*) 0.30 - 0.70 IU/mL  BASIC METABOLIC PANEL     Status: Abnormal   Collection Time    07/15/12 11:55 PM      Result Value Range   Sodium 133 (*) 135 - 145 mEq/L   Potassium 4.0  3.5 - 5.1 mEq/L   Chloride 97  96 - 112 mEq/L   CO2 23  19 - 32 mEq/L   Glucose, Bld 194 (*) 70 - 99 mg/dL   BUN 32 (*) 6 - 23 mg/dL   Creatinine, Ser 1.61 (*) 0.50 - 1.35 mg/dL   Calcium 8.7  8.4 - 09.6 mg/dL   GFR calc non Af Amer 41 (*) >90 mL/min   GFR calc Af Amer 48 (*) >90 mL/min  MAGNESIUM     Status: None   Collection Time    07/15/12 11:55 PM      Result Value Range   Magnesium 1.8  1.5 - 2.5  mg/dL  BASIC METABOLIC PANEL     Status: Abnormal   Collection Time    07/16/12  4:20 AM      Result Value Range   Sodium 133 (*) 135 - 145 mEq/L   Potassium 4.0  3.5 - 5.1 mEq/L   Chloride 97  96 - 112 mEq/L   CO2 25  19 - 32 mEq/L   Glucose, Bld 168 (*) 70 - 99 mg/dL   BUN 33 (*) 6 - 23 mg/dL   Creatinine, Ser 0.45 (*) 0.50 - 1.35 mg/dL   Calcium 8.6  8.4 - 40.9 mg/dL   GFR calc non Af Amer 39 (*) >90 mL/min   GFR calc Af Amer 46 (*) >90 mL/min  CBC     Status: Abnormal   Collection Time    07/16/12  4:20 AM      Result Value Range   WBC 6.1  4.0 - 10.5 K/uL   RBC 4.97  4.22 - 5.81 MIL/uL   Hemoglobin 14.1  13.0 - 17.0 g/dL   HCT 81.1  91.4 - 78.2 %   MCV 83.7  78.0 - 100.0 fL   MCH 28.4  26.0 - 34.0 pg   MCHC 33.9  30.0 - 36.0 g/dL   RDW 95.6 (*) 21.3 - 08.6 %   Platelets 235  150 - 400 K/uL  HEPARIN LEVEL (UNFRACTIONATED)     Status: None   Collection Time    07/16/12  4:20 AM      Result Value Range   Heparin Unfractionated 0.51  0.30 - 0.70 IU/mL  TROPONIN I     Status: Abnormal   Collection Time    07/16/12  4:20 AM      Result Value Range   Troponin I >20.00 (*) <0.30 ng/mL  GLUCOSE, CAPILLARY     Status: Abnormal   Collection Time    07/16/12  8:36 AM  Result Value Range   Glucose-Capillary 163 (*) 70 - 99 mg/dL    Imaging results:  Dg Chest Portable 1 View  07/15/2012   *RADIOLOGY REPORT*  Clinical Data: Chest pain.  PORTABLE CHEST - 1 VIEW  Comparison: 06/16/2012.  Findings: The heart is mildly enlarged.  There is a right ventricular ICD pacer wire with left-sided generator.  The mediastinal and hilar contours are normal.  There is mild, generalized diffuse interstitial thickening.  No alveolar disease. No pneumothoraces or evident effusions.  IMPRESSION: Cardiomegaly and interstitial thickening consistent with congestive heart failure.   Original Report Authenticated By: Sander Radon, M.D.    Assessment and Plan: I have seen and evaluated the  patient as outlined above. I agree with the formulated Assessment and Plan as detailed in the residents' admission note, with the following changes:  1. NSTEMI: Patient refusing additional troponin draws. No immediate plans for cardiac cath.  Continue NTG gtt, heparin gtt.  Watch BP. BZD to treat vasospasm component.   2. IDDM type 2: SSI, will need basal insulin. 3. Rest per resident note.  Jonah Blue, DO 6/29/201411:34 AM

## 2012-07-16 NOTE — Progress Notes (Signed)
Subjective: The patient is sitting up eating breakfast when I enter. He states that he is tired and does not have much of an appetite. He denies more chest pain overnight. He denies current chest pain or SOB. He is fatigued. He is not having abdominal pain or N/V. He denies diarrhea.  Objective: Vital signs in last 24 hours: Filed Vitals:   07/16/12 0500 07/16/12 0600 07/16/12 0800 07/16/12 0836  BP:  131/92 113/74   Pulse:  51  104  Temp:    98 F (36.7 C)  TempSrc:    Oral  Resp:      Height:      Weight: 230 lb 9.6 oz (104.6 kg)     SpO2:  96%  100%   Weight change:   Intake/Output Summary (Last 24 hours) at 07/16/12 1111 Last data filed at 07/16/12 0600  Gross per 24 hour  Intake 286.25 ml  Output      0 ml  Net 286.25 ml   Physical Exam: General: resting in bed, mild distress, speaking in short sentences, not diaphoretic HEENT: PERRL, EOMI, no scleral icterus  Cardiac: S1 S2 heard, no obvious murmur  Pulm: decreased volume of air and crackles at the bases  Abd: soft, nontender, distended, BS present  Ext: warm and well perfused, 1-2+ pedal edema  Neuro: alert and oriented X3, cranial nerves II-XII grossly intact  Lab Results: Basic Metabolic Panel:  Recent Labs Lab 07/15/12 2355 07/16/12 0420  NA 133* 133*  K 4.0 4.0  CL 97 97  CO2 23 25  GLUCOSE 194* 168*  BUN 32* 33*  CREATININE 1.91* 1.98*  CALCIUM 8.7 8.6  MG 1.8  --    Liver Function Tests:  Recent Labs Lab 07/15/12 0925  AST 20  ALT 12  ALKPHOS 80  BILITOT 1.4*  PROT 6.9  ALBUMIN 3.1*  CBC:  Recent Labs Lab 07/15/12 0925 07/16/12 0420  WBC 5.4 6.1  NEUTROABS 3.5  --   HGB 14.7 14.1  HCT 43.5 41.6  MCV 83.7 83.7  PLT 229 235   Cardiac Enzymes:  Recent Labs Lab 07/15/12 1432 07/15/12 2243 07/16/12 0420  TROPONINI 15.17* >20.00* >20.00*   Urine Drug Screen: Drugs of Abuse     Component Value Date/Time   LABOPIA NONE DETECTED 07/15/2012 0926   LABOPIA NEG 02/01/2012  1356   COCAINSCRNUR POSITIVE* 07/15/2012 0926   COCAINSCRNUR PPS 02/01/2012 1356   LABBENZ NONE DETECTED 07/15/2012 0926   LABBENZ PPS 02/01/2012 1356   LABBENZ NEG 07/09/2010 1522   AMPHETMU NONE DETECTED 07/15/2012 0926   AMPHETMU NEG 07/09/2010 1522   THCU NONE DETECTED 07/15/2012 0926   LABBARB NONE DETECTED 07/15/2012 0926   LABBARB NEG 02/01/2012 1356    Medications: I have reviewed the patient's current medications. Scheduled Meds: . aspirin EC  81 mg Oral Daily  . clorazepate  7.5 mg Oral Daily  . digoxin  0.125 mg Oral Daily  . docusate sodium  100 mg Oral BID  . insulin aspart  0-15 Units Subcutaneous TID WC  . metolazone  2.5 mg Oral Daily  . mirtazapine  30 mg Oral QHS  . pantoprazole  40 mg Oral Daily  . senna  1 tablet Oral BID  . sodium chloride  3 mL Intravenous Q12H  . torsemide  60 mg Oral Daily   Continuous Infusions: . heparin 1,600 Units/hr (07/16/12 0505)  . nitroGLYCERIN 30 mcg/min (07/15/12 2035)   PRN Meds:.ondansetron (ZOFRAN) IV, ondansetron Assessment/Plan: NSTEMI (non-ST  elevated myocardial infarction) - Troponins greater than 20 and still on heparin drip and nitro drip. Blood pressures holding steady in 120s/70s overnight. Concern for how much cardiac tissue he is losing as he had EF 15% to start with. Likely induced by cocaine vasospasm so not performing cath emergently. Will continue heparin and nitro drip until tomorrow per cards. Some runs of VT overnight and does have defib in chest. He has ativan on board as needed. Could still be poor prognosis if MI is widening or tissue damage affects contractility of the heart.   Type II or unspecified type diabetes mellitus with renal manifestations - Keeping on SSI at this time without lantus as unclear how much he will eat. No hyperglycemic or hypoglycemic episodes.   SLEEP APNEA, OBSTRUCTIVE, MILD - Ordered CPAP but per patient did not receive it. Verified that order for CPAP is in place and he should have that  tonight.  Acute on chronic systolic heart failure - See above for details but previous EF 15%. Unclear what current EF is. Unable to afford ACE-I in the past but should be on one.  HTN (hypertension) - Has been hypotensive to normotensive here which is likely poor prognostic factor.  Cocaine abuse - Patient denies but consistent UDS + results.    Dispo: Disposition is deferred at this time, awaiting improvement of current medical problems.  Anticipated discharge in approximately 3-4 day(s).   The patient does have a current PCP Almyra Deforest, MD) and does need an [facility] hospital follow-up appointment after discharge.  The patient does have transportation limitations that hinder transportation to clinic appointments.  .Services Needed at time of discharge: Y = Yes, Blank = No PT:   OT:   RN:   Equipment:   Other:     LOS: 1 day   Judie Bonus, MD 07/16/2012, 11:11 AM

## 2012-07-17 ENCOUNTER — Encounter (HOSPITAL_COMMUNITY)
Admission: EM | Disposition: A | Discharge status: Home / Self Care | Payer: Self-pay | Source: Home / Self Care | Attending: Internal Medicine

## 2012-07-17 DIAGNOSIS — I2510 Atherosclerotic heart disease of native coronary artery without angina pectoris: Secondary | ICD-10-CM

## 2012-07-17 HISTORY — PX: LEFT HEART CATHETERIZATION WITH CORONARY ANGIOGRAM: SHX5451

## 2012-07-17 LAB — GLUCOSE, CAPILLARY
Glucose-Capillary: 140 mg/dL — ABNORMAL HIGH (ref 70–99)
Glucose-Capillary: 95 mg/dL (ref 70–99)

## 2012-07-17 LAB — BASIC METABOLIC PANEL
CO2: 29 mEq/L (ref 19–32)
Chloride: 97 mEq/L (ref 96–112)
Glucose, Bld: 149 mg/dL — ABNORMAL HIGH (ref 70–99)
Potassium: 3.5 mEq/L (ref 3.5–5.1)
Sodium: 132 mEq/L — ABNORMAL LOW (ref 135–145)

## 2012-07-17 LAB — CBC
HCT: 42 % (ref 39.0–52.0)
Hemoglobin: 14.2 g/dL (ref 13.0–17.0)
MCH: 27.9 pg (ref 26.0–34.0)
MCV: 82.5 fL (ref 78.0–100.0)
RBC: 5.09 MIL/uL (ref 4.22–5.81)

## 2012-07-17 SURGERY — LEFT HEART CATHETERIZATION WITH CORONARY ANGIOGRAM
Anesthesia: LOCAL

## 2012-07-17 MED ORDER — MIDAZOLAM HCL 2 MG/2ML IJ SOLN
INTRAMUSCULAR | Status: AC
  Filled 2012-07-17: qty 2

## 2012-07-17 MED ORDER — CLOPIDOGREL BISULFATE 75 MG PO TABS
75.0000 mg | ORAL_TABLET | Freq: Every day | ORAL | Status: DC
  Administered 2012-07-19: 75 mg via ORAL
  Filled 2012-07-17 (×3): qty 1

## 2012-07-17 MED ORDER — ACETAMINOPHEN 325 MG PO TABS: 650.0000 mg | ORAL_TABLET | ORAL | Status: DC | PRN

## 2012-07-17 MED ORDER — NITROGLYCERIN 0.2 MG/ML ON CALL CATH LAB
INTRAVENOUS | Status: AC
  Filled 2012-07-17: qty 1

## 2012-07-17 MED ORDER — HEPARIN SODIUM (PORCINE) 5000 UNIT/ML IJ SOLN
5000.0000 [IU] | Freq: Three times a day (TID) | INTRAMUSCULAR | Status: DC
  Administered 2012-07-18 – 2012-07-19 (×3): 5000 [IU] via SUBCUTANEOUS
  Filled 2012-07-17 (×8): qty 1

## 2012-07-17 MED ORDER — SODIUM CHLORIDE 0.9 % IJ SOLN: 3.0000 mL | Freq: Two times a day (BID) | INTRAMUSCULAR | Status: DC

## 2012-07-17 MED ORDER — SODIUM CHLORIDE 0.9 % IJ SOLN: 3.0000 mL | INTRAMUSCULAR | Status: DC | PRN

## 2012-07-17 MED ORDER — SODIUM CHLORIDE 0.9 % IV SOLN: 250.0000 mL | INTRAVENOUS | Status: DC | PRN

## 2012-07-17 MED ORDER — CLOPIDOGREL BISULFATE 300 MG PO TABS
300.0000 mg | ORAL_TABLET | Freq: Once | ORAL | Status: AC
  Administered 2012-07-17: 300 mg via ORAL
  Filled 2012-07-17: qty 1

## 2012-07-17 MED ORDER — ASPIRIN 81 MG PO CHEW: 324.0000 mg | CHEWABLE_TABLET | ORAL | Status: DC

## 2012-07-17 MED ORDER — ONDANSETRON HCL 4 MG/2ML IJ SOLN: 4.0000 mg | Freq: Four times a day (QID) | INTRAMUSCULAR | Status: DC | PRN

## 2012-07-17 MED ORDER — HYDRALAZINE HCL 25 MG PO TABS
12.5000 mg | ORAL_TABLET | Freq: Three times a day (TID) | ORAL | Status: DC
  Filled 2012-07-17 (×3): qty 0.5

## 2012-07-17 MED ORDER — NITROGLYCERIN IN D5W 200-5 MCG/ML-% IV SOLN: 2.0000 ug/min | INTRAVENOUS | Status: DC

## 2012-07-17 MED ORDER — POTASSIUM CHLORIDE CRYS ER 20 MEQ PO TBCR
40.0000 meq | EXTENDED_RELEASE_TABLET | Freq: Two times a day (BID) | ORAL | Status: DC
  Administered 2012-07-17 – 2012-07-19 (×5): 40 meq via ORAL
  Filled 2012-07-17 (×7): qty 2

## 2012-07-17 MED ORDER — FENTANYL CITRATE 0.05 MG/ML IJ SOLN
INTRAMUSCULAR | Status: AC
  Filled 2012-07-17: qty 2

## 2012-07-17 MED ORDER — FUROSEMIDE 10 MG/ML IJ SOLN
80.0000 mg | Freq: Two times a day (BID) | INTRAMUSCULAR | Status: DC
  Administered 2012-07-17 – 2012-07-18 (×4): 80 mg via INTRAVENOUS
  Filled 2012-07-17 (×8): qty 8

## 2012-07-17 MED ORDER — SODIUM CHLORIDE 0.9 % IV SOLN: INTRAVENOUS | Status: DC

## 2012-07-17 MED ORDER — SODIUM CHLORIDE 0.9 % IJ SOLN
3.0000 mL | Freq: Two times a day (BID) | INTRAMUSCULAR | Status: DC
  Administered 2012-07-18 – 2012-07-19 (×2): 3 mL via INTRAVENOUS

## 2012-07-17 MED ORDER — HEPARIN (PORCINE) IN NACL 2-0.9 UNIT/ML-% IJ SOLN
INTRAMUSCULAR | Status: AC
  Filled 2012-07-17: qty 1000

## 2012-07-17 MED ORDER — LIDOCAINE HCL (PF) 1 % IJ SOLN
INTRAMUSCULAR | Status: AC
  Filled 2012-07-17: qty 30

## 2012-07-17 MED ORDER — HYDRALAZINE HCL 25 MG PO TABS
25.0000 mg | ORAL_TABLET | Freq: Three times a day (TID) | ORAL | Status: DC
  Administered 2012-07-17 (×2): 25 mg via ORAL
  Filled 2012-07-17 (×6): qty 1

## 2012-07-17 MED ORDER — DIAZEPAM 5 MG PO TABS
5.0000 mg | ORAL_TABLET | ORAL | Status: AC
  Administered 2012-07-17: 5 mg via ORAL
  Filled 2012-07-17: qty 1

## 2012-07-17 NOTE — CV Procedure (Signed)
   Cardiac Catheterization Procedure Note  Name: [name] MRN: [id number] DOB: 12-03-1965  Procedure: Left Heart Cath, Selective Coronary Angiography  Indication: NSTEMI   Procedural details: Allen's test was negative on the right wrist.  Therefore, the right groin was prepped, draped, and anesthetized with 1% lidocaine. Using modified Seldinger technique, a 5 French sheath was introduced into the right femoral artery. MP catheter used for coronary angiography and to enter the left ventricle.  No LV-gram due to elevated creatinine. Catheter exchanges were performed over a guidewire. There were no immediate procedural complications. The patient was transferred to the post catheterization recovery area for further monitoring.  40 cc contrast was used.   Procedural Findings: Hemodynamics:  AO 103/82 LV 101/31   Coronary angiography: Coronary dominance: right  Left mainstem: No angiographic CAD.  Left anterior descending (LAD): No angiographic CAD.  Left circumflex (LCx): AV LCx with 30% stenosis in the mid-vessel.  There is a large OM1.  40% stenosis at the ostium of the first branch off the OM1.  There appeared to be subtotal thrombotic occlusion of the distal portion of the OM1.  The vessel was small in caliber at this point.    Right coronary artery (RCA): No angiographic CAD.  Left ventriculography: Not done, elevated creatinine.   Final Conclusions:  Subtotal thrombotic occlusion of the distal OM1.  The vessel is too small at this point for PCI.  Will manage medically.  Possibly this represents cocaine-triggered plaque rupture with thrombosis.  However, cannot rule embolism from the LV given cardiomyopathy.   Recommendations: Echo with contrast to assess for LV thrombus.  Will start Plavix 300 mg load then 75 mg daily.  Continue for 1 year for medical treatment of NSTEMI. Needs to stop cocaine.   [name] 07/17/2012, 4:56 PM

## 2012-07-17 NOTE — Progress Notes (Signed)
Nutrition Brief Note  Malnutrition Screening Tool result is inaccurate.  Please consult if nutrition needs are identified.  Williemae Muriel Kowalski RD, LDN Pager #[phone] After Hours pager #[phone]   

## 2012-07-17 NOTE — Progress Notes (Signed)
Subjective: [name] was seen and examined at bedside this morning.  He had just finished eating breakfast which he tolerated well.  He says his chest pain has resolved this morning and denies any SOB, N/V/D, abdominal pain, or any urinary complaints at this time.    Of note, he did have a 10 beat run of VTACH overnight.     Objective: Vital signs in last 24 hours: Filed Vitals:   07/17/12 0400 07/17/12 0432 07/17/12 0720 07/17/12 1245  BP: 141/86  136/92 113/81  Pulse:   104 98  Temp: 99.6 F (37.6 C)  98.4 F (36.9 C) 98.5 F (36.9 C)  TempSrc: Oral  Oral Oral  Resp:   18 16  Height:      Weight:  221 lb 9 oz (100.5 kg)    SpO2: 97%  96% 97%   Weight change: -7 lb 0.9 oz (-3.2 kg)  Intake/Output Summary (Last 24 hours) at 07/17/12 1322 Last data filed at 07/17/12 0700  Gross per 24 hour  Intake    795 ml  Output      0 ml  Net    795 ml   Physical Exam: General: resting in bed, NAD HEENT: PERRL, EOMI, no scleral icterus  Cardiac: RRR Pulm: CTA B/L Abd: soft, nontender, distended, BS present  Ext: warm and well perfused, +1 pitting edema LLE>RLE, +2dp b/l Neuro: alert and oriented X3, cranial nerves II-XII grossly intact  Lab Results: Basic Metabolic Panel:  Recent Labs Lab 07/15/12 2355 07/16/12 0420 07/17/12 0440  NA 133* 133* 132*  K 4.0 4.0 3.5  CL 97 97 97  CO2 23 25 29   GLUCOSE 194* 168* 149*  BUN 32* 33* 29*  CREATININE 1.91* 1.98* 2.00*  CALCIUM 8.7 8.6 8.3*  MG 1.8  --   --    Liver Function Tests:  Recent Labs Lab 07/15/12 0925  AST 20  ALT 12  ALKPHOS 80  BILITOT 1.4*  PROT 6.9  ALBUMIN 3.1*  CBC:  Recent Labs Lab 07/15/12 0925 07/16/12 0420 07/17/12 0440  WBC 5.4 6.1 7.1  NEUTROABS 3.5  --   --   HGB 14.7 14.1 14.2  HCT 43.5 41.6 42.0  MCV 83.7 83.7 82.5  PLT 229 235 210   Cardiac Enzymes:  Recent Labs Lab 07/15/12 1432 07/15/12 2243 07/16/12 0420  TROPONINI 15.17* >20.00* >20.00*   Urine Drug Screen: Drugs  of Abuse     Component Value Date/Time   LABOPIA NONE DETECTED 07/15/2012 0926   LABOPIA NEG 02/01/2012 1356   COCAINSCRNUR POSITIVE* 07/15/2012 0926   COCAINSCRNUR PPS 02/01/2012 1356   LABBENZ NONE DETECTED 07/15/2012 0926   LABBENZ PPS 02/01/2012 1356   LABBENZ NEG 07/09/2010 1522   AMPHETMU NONE DETECTED 07/15/2012 0926   AMPHETMU NEG 07/09/2010 1522   THCU NONE DETECTED 07/15/2012 0926   LABBARB NONE DETECTED 07/15/2012 0926   LABBARB NEG 02/01/2012 1356    Medications: I have reviewed the patient's current medications. Scheduled Meds: . aspirin EC  81 mg Oral Daily  . clorazepate  7.5 mg Oral Daily  . digoxin  0.125 mg Oral Daily  . docusate sodium  100 mg Oral BID  . furosemide  80 mg Intravenous BID  . hydrALAZINE  25 mg Oral Q8H  . insulin aspart  0-15 Units Subcutaneous TID WC  . mirtazapine  30 mg Oral QHS  . pantoprazole  40 mg Oral Daily  . potassium chloride  40 mEq Oral BID  .  senna  1 tablet Oral BID  . sodium chloride  3 mL Intravenous Q12H   Continuous Infusions: . sodium chloride 10 mL/hr at 07/16/12 0900  . heparin 1,750 Units/hr (07/17/12 0930)  . nitroGLYCERIN Stopped (07/17/12 0942)   PRN Meds:.ondansetron (ZOFRAN) IV, ondansetron Assessment/Plan: [name] is a 47 year old male with PMH of CHF EF 155, CKD stage III, HTN, OSA, and cocaine abuse admitted for NSTEMI 2/2 to cocaine.   NSTEMI (non-ST elevated myocardial infarction)--Troponins increased to greater than 20 .  Started on heparin and nitro drip.  Concern for cocaine induced vasospasm, so cath not done initially.  Possible cocaine mediated vasospasm vs. Cocaine-related plaque rupture with MI.  Cardiology on board.  Chest pain improved today.  Will need LHC.  -continue step down monitoring -appreciate cardiology following, seen by Dr. Pierce Crane to wean Nitro and start IMDUR later today if tolerated.  Needs LHC.  Volume overloaded, will need to continue to diurese but caution with renal function.     -d/c torsemide, start IV lasix 80mg  bid, may need to transition to Milrinone if Cr continues to rise -continue to hold BB in setting of recent cocaine use -add hydralazine 25mg  TID -monitor electrolytes and replace as needed  Acute on chronic systolic heart failure - Echo 01/6107: severely dilated LV, mild LVH, EF 15%, diffuse hypokinesis, high ventricular filling pressure, left atrium mildly dilated, RV moderately dilated, mildly to moderately reduced systolic function, right atrium mildly dilated, PA peak pressure .  Follows with dr. Gala Romney per patient.  Home regimen includes: Coreg 3.125mg  BID, digoxin 0.125mg  qd, hydralazine 25mg  TID, IMDUR 60mg  qd, metolazone 2.5mg  qd, and torsemide 60mg  qd.  Unable to afford ACE-I in the past but likely should be on one as well.  -restart hydralazine today and transition to IMDUR -diuresis with IV lasix and consider transition to Milrinone if Cr continues to worsen -hold BB in setting of cocaine use  Type II or unspecified type diabetes mellitus with renal manifestations--no home medications for diabetes.  HbA1C 7.9 07/15/12.  Cr. 1.86 on admission.   -currently NPO, continue SSI moderate at this time -cbg monitoring  CKD--stage 3; Cr on admission 1.86.  Increased to 2.0 today with diuresis.  Baseline Cr 1.6-1.7 since 04/2010.  GFR on admission 49, down to 45.   -continue diuresis per cardiology, may transition to Milrinone if Cr continues to rise -trend renal function  SLEEP APNEA, OBSTRUCTIVE, MILD -continue CPAP  HTN (hypertension)--Home regimen includes: Coreg 3.125mg  BID, digoxin 0.125mg  qd, hydralazine 25mg  TID, IMDUR 60mg  qd, metolazone 2.5mg  qd, and torsemide 60mg  qd. Currently on nitro gtt to be weaned off today.  BP stable for now.   -continue to monitor -wean off nitro gtt and resume IMDUR and hydralazine  Cocaine abuse - Patient denies but consistent UDS + cocaine on admission  -cessation strongly advised and counseling  given  Diet: NPO  DVT Ppx: Heparin gtt Dispo: Disposition is deferred at this time, awaiting improvement of current medical problems.  Anticipated discharge in approximately 3-4 day(s).   The patient does have a current PCP Darden Palmer, MD) and does need an [facility] hospital follow-up appointment after discharge.  The patient does have transportation limitations that hinder transportation to clinic appointments.  .Services Needed at time of discharge: Y = Yes, Blank = No PT:   OT:   RN:   Equipment:   Other:     LOS: 2 days   Darden Palmer, MD 07/17/2012, 1:22 PM

## 2012-07-17 NOTE — Progress Notes (Signed)
Internal Medicine Teaching Service Attending Note Date: 07/17/2012  Patient name: [name]  Medical record number: [id number]  Date of birth: 1965-06-11   Mr Congrove is our 47 year old gentleman admitted over the weekend with chest pain, who ended up having a NSTEMI with troponins positive. He does not have any further chest pain at this point and feels comfortable.    Filed Vitals:   07/16/12 2300 07/17/12 0400 07/17/12 0432 07/17/12 0720  BP: 141/84 141/86  136/92  Pulse:    104  Temp: 99 F (37.2 C) 99.6 F (37.6 C)  98.4 F (36.9 C)  TempSrc: Oral Oral  Oral  Resp:    18  Height:      Weight:   221 lb 9 oz (100.5 kg)   SpO2: 97% 97%  96%   Vitals reviewed.  General: Resting in bed. HEENT: PERRL, EOMI, no scleral icterus. Heart: RRR, no rubs, murmurs or gallops. Lungs: Clear to auscultation bilaterally, no wheezes, rales, or rhonchi. Abdomen: Soft, nontender, nondistended, BS present. Extremities: Warm, pulses intact, mild pedal edema bilaterally. Neuro: Alert and oriented X3, cranial nerves II-XII grossly intact,  strength and sensation to light touch equal in bilateral upper and lower extremities    Recent Labs Lab 07/15/12 1432 07/15/12 2243 07/16/12 0420  TROPONINI 15.17* >20.00* >20.00*    Recent Labs Lab 07/15/12 0925 07/15/12 1330 07/15/12 2355 07/16/12 0420 07/17/12 0440  NA 132* 138 133* 133* 132*  K 6.3* 3.6 4.0 4.0 3.5  CL 99 100 97 97 97  CO2 25 28 23 25 29   GLUCOSE 201* 118* 194* 168* 149*  BUN 30* 29* 32* 33* 29*  CREATININE 1.86* 1.83* 1.91* 1.98* 2.00*  CALCIUM 8.7 8.9 8.7 8.6 8.3*  MG  --   --  1.8  --   --     Recent Labs Lab 07/15/12 0925 07/16/12 0420 07/17/12 0440  HGB 14.7 14.1 14.2  HCT 43.5 41.6 42.0  WBC 5.4 6.1 7.1  PLT 229 235 210    Assessment and Plan    NSTEMI: Patient has no chest pain at present. Cardiology is on board. The patient will have a cardiac cath this evening likely. We will follow up with  cardiology recommendations.  Rest per resident note.   Tuesday Terlecki 07/17/2012, 12:03 PM.

## 2012-07-17 NOTE — Progress Notes (Signed)
Advanced Heart Failure Rounding Note   Subjective:   [name] is a 47 y/o male with HTN and DM2 with longstanding HF due to NICM (probably ETOH), EF 20%. Cath in 2008 showed left circumflex with 40% stenosis and ongoing noncompliance. He was previously Hospice but this ended in December 2013. [name] stopped due to hyperkalemia. Benazepril and Lopressor stopped due to hypotension.    Admitted 03/2011 with NYHA Class IV symptoms, requiring milrinone therapy and IV lasix. Cath 04/13/11: cardiogenic shock with biventricular failure (R>L) and possible restrictive physiology. RA = 22 with steep y-descents, RV = 45/12/25, PA = 51/25 (34), PCW = 18-20, Fick cardiac output/index = 3.1/1.4, Thermo CO/CI = 3.6/1.6, PVR = 4.8 [place] O2 sat = 85% (after sedation), PA sat = 31%, 36%. Milrinone was initiated for low output physiology.   He has been followed closely by the HF team and was last seen in the clinic 07/06/12. At that time he had volume overload and was given Metolazone. His situation is complicated by his social situation given that he is homeless and has 3 children.   Admitted over the weekend for CP and increased dyspnea. UDS + cocaine. Troponin trend (0.09=>15.19>20). Yesterday he was placed on NTG. Nitro titrated up to 30 mcg. Also placed on Heparin drip as well as aspirin.    Denies CP. + Orthopnea. Denies PND.       Objective:   Weight Range:  Vital Signs:   Temp:  [98.4 F (36.9 C)-99.6 F (37.6 C)] 98.4 F (36.9 C) (06/30 0720) Pulse Rate:  [99-105] 104 (06/30 0720) Resp:  [18-19] 18 (06/30 0720) BP: (122-146)/(73-97) 136/92 mmHg (06/30 0720) SpO2:  [96 %-100 %] 96 % (06/30 0720) Weight:  [221 lb 9 oz (100.5 kg)] 221 lb 9 oz (100.5 kg) (06/30 0432) Last BM Date: 07/15/12  Weight change: Filed Weights   07/15/12 1318 07/16/12 0500 07/17/12 0432  Weight: 228 lb 9.9 oz (103.7 kg) 230 lb 9.6 oz (104.6 kg) 221 lb 9 oz (100.5 kg)    Intake/Output:   Intake/Output Summary (Last 24  hours) at 07/17/12 0841 Last data filed at 07/17/12 0700  Gross per 24 hour  Intake   1290 ml  Output      0 ml  Net   1290 ml     Physical Exam: General:  Chronically ill appearing. No resp difficulty HEENT: normal Neck: supple. JVP to jaw . Carotids 2+ bilat; no bruits. No lymphadenopathy or thryomegaly appreciated. Cor: PMI nondisplaced. Regular rate & rhythm. No rubs, or murmurs.+ S3 2/6 MR Lungs: clear Abdomen: soft, nontender, mildly distended. No hepatosplenomegaly. No bruits or masses. Good bowel sounds. Extremities: no cyanosis, clubbing, rash, R and LLE 2+edema Neuro: alert & orientedx3, cranial nerves grossly intact. moves all 4 extremities w/o difficulty. Affect pleasant  Telemetry: SR 90s  Labs: Basic Metabolic Panel:  Recent Labs Lab 07/15/12 0925 07/15/12 1330 07/15/12 2355 07/16/12 0420 07/17/12 0440  NA 132* 138 133* 133* 132*  K 6.3* 3.6 4.0 4.0 3.5  CL 99 100 97 97 97  CO2 25 28 23 25 29   GLUCOSE 201* 118* 194* 168* 149*  BUN 30* 29* 32* 33* 29*  CREATININE 1.86* 1.83* 1.91* 1.98* 2.00*  CALCIUM 8.7 8.9 8.7 8.6 8.3*  MG  --   --  1.8  --   --     Liver Function Tests:  Recent Labs Lab 07/15/12 0925  AST 20  ALT 12  ALKPHOS 80  BILITOT 1.4*  PROT  6.9  ALBUMIN 3.1*   No results found for this basename: LIPASE, AMYLASE,  in the last 168 hours No results found for this basename: AMMONIA,  in the last 168 hours  CBC:  Recent Labs Lab 07/15/12 0925 07/16/12 0420 07/17/12 0440  WBC 5.4 6.1 7.1  NEUTROABS 3.5  --   --   HGB 14.7 14.1 14.2  HCT 43.5 41.6 42.0  MCV 83.7 83.7 82.5  PLT 229 235 210    Cardiac Enzymes:  Recent Labs Lab 07/15/12 1432 07/15/12 2243 07/16/12 0420  TROPONINI 15.17* >20.00* >20.00*    BNP: BNP (last 3 results)  Recent Labs  03/22/12 0950 06/16/12 1745 07/15/12 0925  PROBNP 4469.0* 4313.0* 4881.0*     Other results:  EKG:   Imaging: Dg Chest Portable 1 View  07/15/2012   *RADIOLOGY  REPORT*  Clinical Data: Chest pain.  PORTABLE CHEST - 1 VIEW  Comparison: 06/16/2012.  Findings: The heart is mildly enlarged.  There is a right ventricular ICD pacer wire with left-sided generator.  The mediastinal and hilar contours are normal.  There is mild, generalized diffuse interstitial thickening.  No alveolar disease. No pneumothoraces or evident effusions.  IMPRESSION: Cardiomegaly and interstitial thickening consistent with congestive heart failure.   Original Report Authenticated By: Sander Radon, M.D.     Medications:     Scheduled Medications: . aspirin EC  81 mg Oral Daily  . clorazepate  7.5 mg Oral Daily  . digoxin  0.125 mg Oral Daily  . docusate sodium  100 mg Oral BID  . furosemide  80 mg Intravenous BID  . hydrALAZINE  25 mg Oral Q8H  . insulin aspart  0-15 Units Subcutaneous TID WC  . metolazone  2.5 mg Oral Daily  . mirtazapine  30 mg Oral QHS  . pantoprazole  40 mg Oral Daily  . potassium chloride  40 mEq Oral BID  . senna  1 tablet Oral BID  . sodium chloride  3 mL Intravenous Q12H    Infusions: . sodium chloride 10 mL/hr at 07/16/12 0900  . heparin 1,600 Units/hr (07/16/12 2122)  . nitroGLYCERIN      PRN Medications: ondansetron (ZOFRAN) IV, ondansetron   Assessment:  1. NSTEMI with positive cocaine in urine.  2. 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 Cocaine abuse  4. CKD, stage III  5. Shellfish allergy  6. 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 Polysubstance abuse  8. Type 2 DM  9. OSA - unable to use homeless 10. GERD/PUD  11. Intermittent LBBB  12. Noncompliance 13. NSVT     Plan/Discussion:   Care complicated by his social situation.   CP resolved. Start to wean Nitro and place on IMDUR later today if he tolerates. Needs LHC given considerable troponin rise though he has CKD.  Possible cocaine-mediated vasospasm versus cocaine-related plaque rupture with MI.    Volume elevated about 10 pounds (baseline weight 210  pounds). Stop Torsemide and switch to 80 mg IV lasix bid. If unable to diurese or creatinine continues to rise will need  Milrinone. Supplement potassium. Hold Coreg with active cocaine abuse. Add hydralazine 25 mg tid.    Will HHRN and HHSW when discharged.If unable to obtain [facility] will need Paramedicine.   [name] 07/17/2012 Length of Stay: 2    Advanced Heart Failure Team Pager [phone] (M-F; 7a - 4p)  Please contact [facility] for night-coverage after hours (4p -7a ) and weekends on [email or web address]

## 2012-07-17 NOTE — Progress Notes (Signed)
ANTICOAGULATION CONSULT NOTE - Follow Up Consult  Pharmacy Consult for heparin Indication: chest pain/ACS  Allergies  Allergen Reactions  . Shellfish Allergy Anaphylaxis  . Adhesive (Tape) Hives  . Tylenol (Acetaminophen) Nausea Only  . Zoloft (Sertraline Hcl) Other (See Comments)    Talked to people in his sleep May 2013   Patient Measurements: Height: 6' (182.9 cm) Weight: 221 lb 9 oz (100.5 kg) IBW/kg (Calculated) : 77.6 Heparin Dosing Weight: 99kg  Vital Signs: Temp: 98.4 F (36.9 C) (06/30 0720) Temp src: Oral (06/30 0720) BP: 136/92 mmHg (06/30 0720) Pulse Rate: 104 (06/30 0720)  Labs:  Recent Labs  07/15/12 0925  07/15/12 1432 07/15/12 2243 07/15/12 2355 07/16/12 0420 07/17/12 0440  HGB 14.7  --   --   --   --  14.1 14.2  HCT 43.5  --   --   --   --  41.6 42.0  PLT 229  --   --   --   --  235 210  HEPARINUNFRC  --   --   --  0.15*  --  0.51 0.17*  CREATININE 1.86*  < >  --   --  1.91* 1.98* 2.00*  TROPONINI  --   --  15.17* >20.00*  --  >20.00*  --   < > = values in this interval not displayed. Estimated Creatinine Clearance: 57.9 ml/min (by C-G formula based on Cr of 2).  Medical History: Past Medical History  Diagnosis Date  . Hypertension   . 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 History of peptic ulcer disease     EGD 09/2007 shows prox gastric ulcer with black eschar, treated with PPI  . Depression   . History of alcohol abuse     quit approx 2011-2012  . Angina   . Shortness of breath   . OSA (obstructive sleep apnea)     sleeps with oxygen  . ICD (implantable cardiac defibrillator) in place   . Diabetes mellitus     insulin dependent  . GERD (gastroesophageal reflux disease)   . Headache(784.0)   . Myasthenia gravis 10/12/2011    Diagnosed in 09/2011. Received Plasmapheresis. CT chest no Thymoma. Acetylcholine receptor antibody negative.    .  CKD (chronic kidney disease) stage 3, GFR 30-59 ml/min   . Myasthenia gravis    Medications:  Infusions:  . sodium chloride 10 mL/hr at 07/16/12 0900  . heparin 1,600 Units/hr (07/16/12 2122)  . nitroGLYCERIN     Assessment: 46 yom presented to the hospital with CP and found to have elevation of his troponin. CBC is WNL, he is on no anticoagulants PTA.   Heparin was started for ACS work-up and possible cath tomorrow with peak Tp 20.  Heparin drip rate 1600 uts/hr HL 0.17 < goal.  No noted bleeding complications, CBC stable.  Goal of Therapy:  Heparin level 0.3-0.7 units/ml Monitor platelets by anticoagulation protocol: Yes   Plan:  1.  Increase IV Heparin to 1750 units/hr.   2.  Check 6hf heparin level 3.  Daily heparin level and CBC  Leota Sauers Pharm.D. CPP, BCPS Clinical Pharmacist [phone] 07/17/2012 9:13 AM

## 2012-07-17 NOTE — Progress Notes (Signed)
Pt. Was placed on CPAP auto titrate (min: 5, max: 15) pt. is unaware of home settings. Pt. Was placed on FFM (what pt. Wears at home) Pt. Is tolerating CPAP well at this time & all vitals are within normal limits.

## 2012-07-17 NOTE — Care Management Note (Addendum)
    Page 1 of 1   07/18/2012     10:18:05 AM   CARE MANAGEMENT NOTE 07/18/2012  Patient:  [name], [name]   Account Number:  [id number]  Date Initiated:  07/17/2012  Documentation initiated by:  [name]  Subjective/Objective Assessment:   adm w ni, inc potassium     Action/Plan:   lives w wife, ?homeless issues, sw ref made   Anticipated DC Date:     Anticipated DC Plan:    In-house referral  Clinical Social Worker      DC Associate Professor  CM consult      Choice offered to / List presented to:             Status of service:   Medicare Important Message given?   (If response is "NO", the following Medicare IM given date fields will be blank) Date Medicare IM given:   Date Additional Medicare IM given:    Discharge Disposition:    Per UR Regulation:  Reviewed for med. necessity/level of care/duration of stay  If discussed at Long Length of Stay Meetings, dates discussed:    Comments:  7/1 1012 Jonathan Jhordan Mckibben rn,bsn spoke w dr [name] 7/1  to see if they felt hospice vs hhc. they are going to rec hhc instead of hospice. pt moved to 4700 and updated 4700 case manager. had spoken w pt on 6-30 and he had hospice til 12/13 but was released by them. he has hx w [facility] and does not want to use them again. he preferred hospice but explained had to have prog of 6mos or less. he was ok w hhc but not ahc. had spoken w dr [name] on 6-30 who had wanted to wait til cath to det if hospice or hhc.

## 2012-07-18 ENCOUNTER — Inpatient Hospital Stay (HOSPITAL_COMMUNITY): Admission: RE | Admit: 2012-07-18 | Payer: Self-pay | Source: Ambulatory Visit

## 2012-07-18 DIAGNOSIS — F1410 Cocaine abuse, uncomplicated: Secondary | ICD-10-CM

## 2012-07-18 DIAGNOSIS — I5022 Chronic systolic (congestive) heart failure: Secondary | ICD-10-CM

## 2012-07-18 DIAGNOSIS — I369 Nonrheumatic tricuspid valve disorder, unspecified: Secondary | ICD-10-CM

## 2012-07-18 LAB — CBC
MCV: 84.1 fL (ref 78.0–100.0)
Platelets: 231 10*3/uL (ref 150–400)
RDW: 16 % — ABNORMAL HIGH (ref 11.5–15.5)
WBC: 6.1 10*3/uL (ref 4.0–10.5)

## 2012-07-18 LAB — BASIC METABOLIC PANEL
CO2: 27 mEq/L (ref 19–32)
Calcium: 8.7 mg/dL (ref 8.4–10.5)
Creatinine, Ser: 1.87 mg/dL — ABNORMAL HIGH (ref 0.50–1.35)
GFR calc non Af Amer: 42 mL/min — ABNORMAL LOW (ref >90)

## 2012-07-18 LAB — GLUCOSE, CAPILLARY
Glucose-Capillary: 172 mg/dL — ABNORMAL HIGH (ref 70–99)
Glucose-Capillary: 197 mg/dL — ABNORMAL HIGH (ref 70–99)

## 2012-07-18 MED ORDER — PERFLUTREN LIPID MICROSPHERE
INTRAVENOUS | Status: AC
  Filled 2012-07-18: qty 10

## 2012-07-18 MED ORDER — CLORAZEPATE DIPOTASSIUM 3.75 MG PO TABS
7.5000 mg | ORAL_TABLET | Freq: Every day | ORAL | Status: DC
  Administered 2012-07-18 – 2012-07-19 (×2): 7.5 mg via ORAL
  Filled 2012-07-18 (×2): qty 2

## 2012-07-18 MED ORDER — PERFLUTREN LIPID MICROSPHERE
1.0000 mL | INTRAVENOUS | Status: AC | PRN
  Administered 2012-07-18: 1 mL via INTRAVENOUS
  Filled 2012-07-18: qty 10

## 2012-07-18 MED ORDER — ISOSORBIDE MONONITRATE ER 30 MG PO TB24
30.0000 mg | ORAL_TABLET | Freq: Every day | ORAL | Status: DC
  Administered 2012-07-18 – 2012-07-19 (×2): 30 mg via ORAL
  Filled 2012-07-18 (×2): qty 1

## 2012-07-18 MED ORDER — HYDRALAZINE HCL 25 MG PO TABS
37.5000 mg | ORAL_TABLET | Freq: Three times a day (TID) | ORAL | Status: DC
  Administered 2012-07-18 – 2012-07-19 (×3): 37.5 mg via ORAL
  Filled 2012-07-18 (×6): qty 1.5

## 2012-07-18 NOTE — Progress Notes (Addendum)
Subjective: [name] feels well this morning. Denies chest pain SOB, N/V/D, abdominal pain, or any urinary complaints at this time.  He was transferred to floor from stepdown. Patient feels ready for discharge and plans to return to shelter called [facility].    Objective: Vital signs in last 24 hours: Filed Vitals:   07/18/12 0600 07/18/12 0725 07/18/12 1037 07/18/12 1046  BP:  122/85 114/69   Pulse:  92 91   Temp:  98.3 F (36.8 C)  97.6 F (36.4 C)  TempSrc:  Oral  Axillary  Resp:   16   Height:   6' (1.829 m)   Weight: 98 kg (216 lb 0.8 oz)  94.1 kg (207 lb 7.3 oz)   SpO2:  96% 100%    Weight change: -2.5 kg (-5 lb 8.2 oz)  Intake/Output Summary (Last 24 hours) at 07/18/12 1247 Last data filed at 07/18/12 1223  Gross per 24 hour  Intake 1087.33 ml  Output   1551 ml  Net -463.67 ml   Physical Exam: General: resting in bed, NAD,  HEENT: PERRL, EOMI, no scleral icterus  Cardiac: RRR, no m/g/r Pulm: CTA B/L, normal work of breathing Abd: soft, nontender, distended, BS present  Ext: warm and well perfused, +1 pitting edema LLE>RLE, good capillary refill Neuro: alert and oriented X3, cranial nerves II-XII grossly intact  Lab Results: Basic Metabolic Panel:  Recent Labs Lab 07/15/12 2355  07/17/12 0440 07/18/12 0415  NA 133*  < > 132* 134*  K 4.0  < > 3.5 4.4  CL 97  < > 97 97  CO2 23  < > 29 27  GLUCOSE 194*  < > 149* 172*  BUN 32*  < > 29* 26*  CREATININE 1.91*  < > 2.00* 1.87*  CALCIUM 8.7  < > 8.3* 8.7  MG 1.8  --   --  1.9  < > = values in this interval not displayed. Liver Function Tests:  Recent Labs Lab 07/15/12 0925  AST 20  ALT 12  ALKPHOS 80  BILITOT 1.4*  PROT 6.9  ALBUMIN 3.1*  CBC:  Recent Labs Lab 07/15/12 0925  07/17/12 0440 07/18/12 0415  WBC 5.4  < > 7.1 6.1  NEUTROABS 3.5  --   --   --   HGB 14.7  < > 14.2 15.2  HCT 43.5  < > 42.0 44.9  MCV 83.7  < > 82.5 84.1  PLT 229  < > 210 231  < > = values in this interval  not displayed. Cardiac Enzymes:  Recent Labs Lab 07/15/12 1432 07/15/12 2243 07/16/12 0420  TROPONINI 15.17* >20.00* >20.00*   Urine Drug Screen: Drugs of Abuse     Component Value Date/Time   LABOPIA NONE DETECTED 07/15/2012 0926   LABOPIA NEG 02/01/2012 1356   COCAINSCRNUR POSITIVE* 07/15/2012 0926   COCAINSCRNUR PPS 02/01/2012 1356   LABBENZ NONE DETECTED 07/15/2012 0926   LABBENZ PPS 02/01/2012 1356   LABBENZ NEG 07/09/2010 1522   AMPHETMU NONE DETECTED 07/15/2012 0926   AMPHETMU NEG 07/09/2010 1522   THCU NONE DETECTED 07/15/2012 0926   LABBARB NONE DETECTED 07/15/2012 0926   LABBARB NEG 02/01/2012 1356    Medications: I have reviewed the patient's current medications. Scheduled Meds: . aspirin EC  81 mg Oral Daily  . clopidogrel  75 mg Oral Q breakfast  . clorazepate  7.5 mg Oral Daily  . digoxin  0.125 mg Oral Daily  . docusate sodium  100 mg Oral  BID  . furosemide  80 mg Intravenous BID  . heparin  5,000 Units Subcutaneous Q8H  . hydrALAZINE  37.5 mg Oral Q8H  . insulin aspart  0-15 Units Subcutaneous TID WC  . isosorbide mononitrate  30 mg Oral Daily  . mirtazapine  30 mg Oral QHS  . pantoprazole  40 mg Oral Daily  . potassium chloride  40 mEq Oral BID  . senna  1 tablet Oral BID  . sodium chloride  3 mL Intravenous Q12H  . sodium chloride  3 mL Intravenous Q12H   Continuous Infusions: . sodium chloride 10 mL/hr at 07/17/12 2000  . nitroGLYCERIN Stopped (07/17/12 0942)   PRN Meds:.sodium chloride, acetaminophen, ondansetron (ZOFRAN) IV, ondansetron, perflutren lipid microspheres (DEFINITY) IV suspension, sodium chloride Assessment/Plan: Mr. Interrante is a 47 year old male with PMH of CHF EF 15%, CKD stage III, HTN, OSA, and cocaine abuse admitted for NSTEMI likely 2/2 to cocaine.   NSTEMI (non-ST elevated myocardial infarction)--Troponins increased to greater than 20 .  Started on heparin and nitro drip.  Concern for cocaine induced vasospasm, so cath not done  initially.  Possible cocaine mediated vasospasm vs. Cocaine-related plaque rupture with MI. LHC completed yesterday. Chest pain resolved. -transferred to floor today from stepdown -appreciate cardiology following, seen by Dr. Shirlee Latch -LHC done yesterday- subtotal thrombotic occulsion of the distal OM1. The vessel is too small for PCI, will manage medically. Cannot rule out embolism from LV given CM.  -Continue lasix 80mg  bid, monitor weight (weight down approx 8lbs today)- Cr is improving slightly and today is 1.8 -Transition to PO lasix on 7/2 per cardiology -continue to hold BB in setting of recent cocaine use - hydralazine 25mg  TID -monitor electrolytes and replace as needed -ECHO with contrast completed 7/1, no clot detected. EF 10%.  -per cardiology, will rx plavix 75mg  daily x 1 year upon discharge  Acute on chronic systolic heart failure - Echo 01/9145: severely dilated LV, mild LVH, EF 15%, diffuse hypokinesis, high ventricular filling pressure, left atrium mildly dilated, RV moderately dilated, mildly to moderately reduced systolic function, right atrium mildly dilated, PA peak pressure .  Follows with dr. Gala Romney per patient.  Home regimen includes: Coreg 3.125mg  BID, digoxin 0.125mg  qd, hydralazine 25mg  TID, IMDUR 60mg  qd, metolazone 2.5mg  qd, and torsemide 60mg  qd.  Unable to afford ACE-I in the past but likely should be on one as well.  -restarted hydralazine and transition to IMDUR -hold BB in setting of cocaine use  Type II or unspecified type diabetes mellitus with renal manifestations--no home medications for diabetes.  HbA1C 7.9 07/15/12.  Cr. 1.86 on admission.   -cardiac diet, continue SSI moderate at this time -cbg monitoring  CKD--stage 3; Cr on admission 1.86, as high as 1.98 during this admission, but improving today at 1.81.  Baseline Cr 1.6-1.7 since 04/2010.   SLEEP APNEA, OBSTRUCTIVE, MILD -continue CPAP  HTN (hypertension)--Home regimen includes: Coreg  3.125mg  BID, digoxin 0.125mg  qd, hydralazine 25mg  TID, IMDUR 60mg  qd, metolazone 2.5mg  qd, and torsemide 60mg  qd. Currently on nitro gtt to be weaned off today.  BP stable for now.   -continue to monitor -d/c nitro drip -resumed IMDUR and hydralazine  Cocaine abuse - Patient denies but consistent UDS + cocaine on admission  -cessation strongly advised and counseling given  Diet: cardiac DVT Ppx: Heparin gtt Dispo: Discharge back to homeless shelter likely tomorrow once on PO lasix. SW is involved.   The patient does have a current PCP Darden Palmer, MD)  and does need an [facility] hospital follow-up appointment after discharge.  The patient does have transportation limitations that hinder transportation to clinic appointments.  .Services Needed at time of discharge: Y = Yes, Blank = No PT:   OT:   RN:   Equipment:   Other:     LOS: 3 days   Windell Hummingbird, MD 07/18/2012, 12:47 PM

## 2012-07-18 NOTE — Progress Notes (Signed)
Advanced Heart Failure Rounding Note   Subjective:   Mr. Normoyle is a 47 y/o male with HTN and DM2 with longstanding HF due to NICM (probably ETOH), EF 20%. Cath in 2008 showed left circumflex with 40% stenosis and ongoing noncompliance. He was previously Hospice but this ended in December 2013. [name] stopped due to hyperkalemia. Benazepril and Lopressor stopped due to hypotension.    Admitted 03/2011 with NYHA Class IV symptoms, requiring milrinone therapy and IV lasix. Cath 04/13/11: cardiogenic shock with biventricular failure (R>L) and possible restrictive physiology. RA = 22 with steep y-descents, RV = 45/12/25, PA = 51/25 (34), PCW = 18-20, Fick cardiac output/index = 3.1/1.4, Thermo CO/CI = 3.6/1.6, PVR = 4.8 Woods O2 sat = 85% (after sedation), PA sat = 31%, 36%. Milrinone was initiated for low output physiology.   He has been followed closely by the HF team and was last seen in the clinic 07/06/12. At that time he had volume overload and was given Metolazone. His situation is complicated by his social situation given that he is homeless and has 3 children.   Admitted over the weekend for CP and increased dyspnea. UDS + cocaine. Troponin trend (0.09=>15.19>20).   07/17/12 LHC Subtotal thrombotic occlusion of the distal OM1. The vessel is too small at this point for PCI. Started on plavix .   No further CP. Denies SOB/PND/Orthopnea. Weight down 4 pounds with IV lasix. Overall he is down 10 pounds.       Objective:   Weight Range:  Vital Signs:   Temp:  [97.4 F (36.3 C)-99.1 F (37.3 C)] 98.3 F (36.8 C) (07/01 0725) Pulse Rate:  [92-98] 92 (07/01 0725) Resp:  [12-30] 18 (06/30 2350) BP: (110-130)/(71-98) 122/85 mmHg (07/01 0725) SpO2:  [96 %-100 %] 96 % (07/01 0725) Weight:  [216 lb 0.8 oz (98 kg)] 216 lb 0.8 oz (98 kg) (07/01 0600) Last BM Date: 07/15/12  Weight change: Filed Weights   07/16/12 0500 07/17/12 0432 07/18/12 0600  Weight: 230 lb 9.6 oz (104.6 kg) 221 lb 9 oz  (100.5 kg) 216 lb 0.8 oz (98 kg)    Intake/Output:   Intake/Output Summary (Last 24 hours) at 07/18/12 0734 Last data filed at 07/18/12 0300  Gross per 24 hour  Intake 1383.38 ml  Output   1551 ml  Net -167.62 ml     Physical Exam: General:  Chronically ill appearing. No resp difficulty HEENT: normal Neck: supple. JVP to jaw . Carotids 2+ bilat; no bruits. No lymphadenopathy or thryomegaly appreciated. Cor: PMI nondisplaced. Regular rate & rhythm. No rubs, or murmurs.+ S3 2/6 MR Lungs: clear Abdomen: soft, nontender, mildly distended. No hepatosplenomegaly. No bruits or masses. Good bowel sounds. Extremities: no cyanosis, clubbing, rash, R and LLE 2+edema Neuro: alert & orientedx3, cranial nerves grossly intact. moves all 4 extremities w/o difficulty. Affect pleasant  Telemetry: SR 90s  Labs: Basic Metabolic Panel:  Recent Labs Lab 07/15/12 1330 07/15/12 2355 07/16/12 0420 07/17/12 0440 07/18/12 0415  NA 138 133* 133* 132* 134*  K 3.6 4.0 4.0 3.5 4.4  CL 100 97 97 97 97  CO2 28 23 25 29 27   GLUCOSE 118* 194* 168* 149* 172*  BUN 29* 32* 33* 29* 26*  CREATININE 1.83* 1.91* 1.98* 2.00* 1.87*  CALCIUM 8.9 8.7 8.6 8.3* 8.7  MG  --  1.8  --   --  1.9    Liver Function Tests:  Recent Labs Lab 07/15/12 0925  AST 20  ALT 12  ALKPHOS  80  BILITOT 1.4*  PROT 6.9  ALBUMIN 3.1*   No results found for this basename: LIPASE, AMYLASE,  in the last 168 hours No results found for this basename: AMMONIA,  in the last 168 hours  CBC:  Recent Labs Lab 07/15/12 0925 07/16/12 0420 07/17/12 0440 07/18/12 0415  WBC 5.4 6.1 7.1 6.1  NEUTROABS 3.5  --   --   --   HGB 14.7 14.1 14.2 15.2  HCT 43.5 41.6 42.0 44.9  MCV 83.7 83.7 82.5 84.1  PLT 229 235 210 231    Cardiac Enzymes:  Recent Labs Lab 07/15/12 1432 07/15/12 2243 07/16/12 0420  TROPONINI 15.17* >20.00* >20.00*    BNP: BNP (last 3 results)  Recent Labs  03/22/12 0950 06/16/12 1745 07/15/12 0925   PROBNP 4469.0* 4313.0* 4881.0*     Other results:    Imaging: No results found.   Medications:     Scheduled Medications: . aspirin  324 mg Oral Pre-Cath  . aspirin EC  81 mg Oral Daily  . clopidogrel  75 mg Oral Q breakfast  . clorazepate  7.5 mg Oral Daily  . digoxin  0.125 mg Oral Daily  . docusate sodium  100 mg Oral BID  . furosemide  80 mg Intravenous BID  . heparin  5,000 Units Subcutaneous Q8H  . hydrALAZINE  37.5 mg Oral Q8H  . insulin aspart  0-15 Units Subcutaneous TID WC  . isosorbide mononitrate  30 mg Oral Daily  . mirtazapine  30 mg Oral QHS  . pantoprazole  40 mg Oral Daily  . potassium chloride  40 mEq Oral BID  . senna  1 tablet Oral BID  . sodium chloride  3 mL Intravenous Q12H  . sodium chloride  3 mL Intravenous Q12H  . sodium chloride  3 mL Intravenous Q12H    Infusions: . sodium chloride 10 mL/hr at 07/17/12 2000  . sodium chloride    . nitroGLYCERIN Stopped (07/17/12 0942)    PRN Medications: sodium chloride, sodium chloride, acetaminophen, ondansetron (ZOFRAN) IV, ondansetron, sodium chloride, sodium chloride   Assessment:  1. NSTEMI with positive cocaine in urine.  2. 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 Cocaine abuse  4. CKD, stage III  5. Shellfish allergy  6. 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 Polysubstance abuse  8. Type 2 DM  9. OSA - unable to use homeless 10. GERD/PUD  11. Intermittent LBBB  12. Noncompliance 13. NSVT     Plan/Discussion:    No further CP/.Subtotal thrombotic occlusion of the distal OM1. The vessel is too small at this point for PCI. On plavix. ECHO with contrast pending to r/o thrombus.   Volume status improving. I/Os have not been accurately recorded but weight is down.  Continue IV lasix one more day. No beta blocker due to cocaine use. Increase hydralazine to 37.5 mg tid.  Start IMDUR 30 mg daily. Renal function stable. Consult cardiac rehab. Will need close follow up in HF clinic.    Will HHRN and HHSW when discharged.If unable to obtain [facility] will need Paramedicine.   Ok to transfer to 4700.   [name] 07/18/2012 Length of Stay: 3    Advanced Heart Failure Team Pager [phone] (M-F; 7a - 4p)  Please contact [facility] for night-coverage after hours (4p -7a ) and weekends on [email or web address]

## 2012-07-18 NOTE — Progress Notes (Signed)
Internal Medicine Attending  Date: 07/18/2012  Patient name: [name] Medical record number: [id number] Date of birth: 09/02/1965 Age: 47 y.o. Gender: male  I saw and evaluated the patient. I reviewed the resident's note by Dr. Darci Needle and I agree with the resident's findings and plans as documented in her progress note.  [name] was found to have a subtotal occlusion of OM1.  The vessel was to small for PCI and medical therapy with plavix is planned for the next year.  Counseling on the dangers of cocaine abuse discussed with patient.  Continuing diuresis with transition to orals.  Expect he will be stable for discharge tomorrow.

## 2012-07-18 NOTE — Progress Notes (Signed)
  Echocardiogram 2D Echocardiogram (with Definity) has been performed.  [name] 07/18/2012, 1:40 PM

## 2012-07-18 NOTE — Progress Notes (Signed)
Called to bedside by cardiac sonographer.  1 ml defintiy administer.

## 2012-07-18 NOTE — Progress Notes (Signed)
Pt to be transferred to unit 4700.  Report called to Anastasia Fiedler, RN. Will continue to monitor.

## 2012-07-18 NOTE — Progress Notes (Signed)
Spoke with pt in regards to nighttime CPAP, machine at bedside, pt currently refuses at this time to wear--complaints of being to hot, adjusted thermostat. RT will assist as needed.

## 2012-07-18 NOTE — Progress Notes (Signed)
Pharmacist Heart Failure Core Measure Documentation  Assessment: [name] has an EF documented as 10% on 07/18/12 by ECHO.  Rationale: Heart failure patients with left ventricular systolic dysfunction (LVSD) and an EF < 40% should be prescribed an angiotensin converting enzyme inhibitor (ACEI) or angiotensin receptor blocker (ARB) at discharge unless a contraindication is documented in the medical record.  This patient is not currently on an ACEI or ARB for HF.  This note is being placed in the record in order to provide documentation that a contraindication to the use of these agents is present for this encounter.  ACE Inhibitor or Angiotensin Receptor Blocker is contraindicated (specify all that apply)  []   ACEI allergy AND ARB allergy []   Angioedema []   Moderate or severe aortic stenosis []   Hyperkalemia [x]   Hypotension (per MD note 07/18/12) []   Renal artery stenosis [x]   Worsening renal function, preexisting renal disease or dysfunction  [name], Pharm D 07/18/2012 2:56 PM

## 2012-07-18 NOTE — Progress Notes (Signed)
Came to ambulate pt however he sts he is too sleepy to ambulate. Could not keep his eyes open while talking. Left MI book, noted he has HF book in room. Will f/u in am to ambulate and ed (pt sts admits he cannot read).  Ethelda Chick CES, ACSM 3:28 PM 07/18/2012

## 2012-07-19 ENCOUNTER — Ambulatory Visit (HOSPITAL_COMMUNITY): Payer: Medicare Other

## 2012-07-19 MED ORDER — ASPIRIN 81 MG PO TBEC: 81.0000 mg | DELAYED_RELEASE_TABLET | Freq: Every day | ORAL | Status: DC

## 2012-07-19 MED ORDER — TORSEMIDE 20 MG PO TABS
60.0000 mg | ORAL_TABLET | Freq: Every day | ORAL | Status: DC
  Administered 2012-07-19: 60 mg via ORAL
  Filled 2012-07-19: qty 3

## 2012-07-19 MED ORDER — CLOPIDOGREL BISULFATE 75 MG PO TABS: 75.0000 mg | ORAL_TABLET | Freq: Every day | ORAL | Status: DC

## 2012-07-19 MED ORDER — HYDRALAZINE HCL 25 MG PO TABS: 37.5000 mg | ORAL_TABLET | Freq: Three times a day (TID) | ORAL | Status: DC

## 2012-07-19 NOTE — Progress Notes (Signed)
Internal Medicine Attending  Date: 07/19/2012  Patient name: [name] Medical record number: [id number] Date of birth: [birth year] Age: 47 y.o. Gender: male  I saw and evaluated the patient. I reviewed the resident's note by Dr. Darci Needle and I agree with the resident's findings and plans as documented in her progress note.  He has been chest pain free and is down 22 pounds since admission with diuresis.  At this point he is felt to be near dry weight and is ready for discharge home. Follow-up will be with his PCP Dr. Virgina Organ and the Heart Failure Team.

## 2012-07-19 NOTE — Progress Notes (Signed)
Advanced Heart Failure Rounding Note   Subjective:   [name] is a 47 y/o male with HTN and DM2 with longstanding HF due to NICM (probably ETOH), EF 20%. Cath in 2008 showed left circumflex with 40% stenosis and ongoing noncompliance. He was previously Hospice but this ended in December 2013. [name] stopped due to hyperkalemia. Benazepril and Lopressor stopped due to hypotension.    Admitted 03/2011 with NYHA Class IV symptoms, requiring milrinone therapy and IV lasix. Cath 04/13/11: cardiogenic shock with biventricular failure (R>L) and possible restrictive physiology. RA = 22 with steep y-descents, RV = 45/12/25, PA = 51/25 (34), PCW = 18-20, Fick cardiac output/index = 3.1/1.4, Thermo CO/CI = 3.6/1.6, PVR = 4.8 Woods O2 sat = 85% (after sedation), PA sat = 31%, 36%. Milrinone was initiated for low output physiology.   He has been followed closely by the HF team and was last seen in the clinic 07/06/12. At that time he had volume overload and was given Metolazone. His situation is complicated by his social situation given that he is homeless and has 3 children.   Admitted over the weekend for CP and increased dyspnea. UDS + cocaine. Troponin trend (0.09=>15.19>20).   07/17/12 LHC Subtotal thrombotic occlusion of the distal OM1. The vessel is too small at this point for PCI. Started on plavix .   07/18/12 ECHO no thrombus . EF 20%  No further CP. Denies SOB/PND/Orthopnea. Weight down 4 pounds with IV lasix. Overall he is down 22 pounds.        Objective:   Weight Range:  Vital Signs:   Temp:  [97.6 F (36.4 C)-99.3 F (37.4 C)] 98.1 F (36.7 C) (07/02 0618) Pulse Rate:  [91-103] 91 (07/02 0618) Resp:  [16-18] 18 (07/02 0618) BP: (94-115)/(61-76) 115/76 mmHg (07/02 0618) SpO2:  [97 %-100 %] 100 % (07/02 0618) Weight:  [206 lb 14.4 oz (93.849 kg)-207 lb 7.3 oz (94.1 kg)] 206 lb 14.4 oz (93.849 kg) (07/02 0618) Last BM Date: 07/15/12  Weight change: Filed Weights   07/18/12 0600  07/18/12 1037 07/19/12 0618  Weight: 216 lb 0.8 oz (98 kg) 207 lb 7.3 oz (94.1 kg) 206 lb 14.4 oz (93.849 kg)    Intake/Output:   Intake/Output Summary (Last 24 hours) at 07/19/12 0811 Last data filed at 07/19/12 1610  Gross per 24 hour  Intake    907 ml  Output   2350 ml  Net  -1443 ml     Physical Exam: General:  Chronically ill appearing. No resp difficulty HEENT: normal Neck: supple. JVP 5-6. Carotids 2+ bilat; no bruits. No lymphadenopathy or thryomegaly appreciated. Cor: PMI nondisplaced. Regular rate & rhythm. No rubs, or murmurs.+ S3 2/6 MR Lungs: clear Abdomen: soft, nontender, mildly distended. No hepatosplenomegaly. No bruits or masses. Good bowel sounds. Extremities: no cyanosis, clubbing, rash,+edema Neuro: alert & orientedx3, cranial nerves grossly intact. moves all 4 extremities w/o difficulty. Affect pleasant  Telemetry: SR 90s  Labs: Basic Metabolic Panel:  Recent Labs Lab 07/15/12 1330 07/15/12 2355 07/16/12 0420 07/17/12 0440 07/18/12 0415  NA 138 133* 133* 132* 134*  K 3.6 4.0 4.0 3.5 4.4  CL 100 97 97 97 97  CO2 28 23 25 29 27   GLUCOSE 118* 194* 168* 149* 172*  BUN 29* 32* 33* 29* 26*  CREATININE 1.83* 1.91* 1.98* 2.00* 1.87*  CALCIUM 8.9 8.7 8.6 8.3* 8.7  MG  --  1.8  --   --  1.9    Liver Function Tests:  Recent  Labs Lab 07/15/12 0925  AST 20  ALT 12  ALKPHOS 80  BILITOT 1.4*  PROT 6.9  ALBUMIN 3.1*   No results found for this basename: LIPASE, AMYLASE,  in the last 168 hours No results found for this basename: AMMONIA,  in the last 168 hours  CBC:  Recent Labs Lab 07/15/12 0925 07/16/12 0420 07/17/12 0440 07/18/12 0415  WBC 5.4 6.1 7.1 6.1  NEUTROABS 3.5  --   --   --   HGB 14.7 14.1 14.2 15.2  HCT 43.5 41.6 42.0 44.9  MCV 83.7 83.7 82.5 84.1  PLT 229 235 210 231    Cardiac Enzymes:  Recent Labs Lab 07/15/12 1432 07/15/12 2243 07/16/12 0420  TROPONINI 15.17* >20.00* >20.00*    BNP: BNP (last 3  results)  Recent Labs  03/22/12 0950 06/16/12 1745 07/15/12 0925  PROBNP 4469.0* 4313.0* 4881.0*     Other results:    Imaging: No results found.   Medications:     Scheduled Medications: . aspirin EC  81 mg Oral Daily  . clopidogrel  75 mg Oral Q breakfast  . clorazepate  7.5 mg Oral Daily  . digoxin  0.125 mg Oral Daily  . docusate sodium  100 mg Oral BID  . heparin  5,000 Units Subcutaneous Q8H  . hydrALAZINE  37.5 mg Oral Q8H  . insulin aspart  0-15 Units Subcutaneous TID WC  . isosorbide mononitrate  30 mg Oral Daily  . mirtazapine  30 mg Oral QHS  . pantoprazole  40 mg Oral Daily  . potassium chloride  40 mEq Oral BID  . senna  1 tablet Oral BID  . sodium chloride  3 mL Intravenous Q12H  . sodium chloride  3 mL Intravenous Q12H  . torsemide  60 mg Oral Daily    Infusions: . sodium chloride 10 mL/hr at 07/17/12 2000    PRN Medications: sodium chloride, acetaminophen, ondansetron (ZOFRAN) IV, ondansetron, sodium chloride   Assessment:  1. NSTEMI with positive cocaine in urine.  2. 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 Cocaine abuse  4. CKD, stage III  5. Shellfish allergy  6. 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 Polysubstance abuse  8. Type 2 DM  9. OSA - unable to use homeless 10. GERD/PUD  11. Intermittent LBBB  12. Noncompliance 13. NSVT     Plan/Discussion:    No further CP/.Subtotal thrombotic occlusion of the distal OM1. The vessel is too small at this point for PCI. On plavix. Will remain on plavix for 1 year. Repeat ECHO no LV thrombus identified. EF 10%.   Volume status stable. Transition to home regimen. Torsemide 60 mg daily and KDur 10 meq daily.  No beta blocker due to cocaine use. Renal function stable.   HF meds for D/C Digoxin 0.125 mg daily Hydralazine 37.5 mg tid Imdur 30 mg daily Torsemide 60 mg daily Kdur 10 meq daily ASA 81 daily  Will also need plavix 75 mg daily.   Will need follow up from SW for  housing assistance. Follow up already schedule for HF clinic next week.   [name] 07/19/2012 8:12 AM   Advanced Heart Failure Team Pager [phone] (M-F; 7a - 4p)  Please contact [facility] for night-coverage after hours (4p -7a ) and weekends on [email or web address]

## 2012-07-19 NOTE — Discharge Summary (Signed)
Name: [name] MRN: [id number] DOB: February 08, 1965 47 y.o. PCP: Darden Palmer, MD  Date of Admission: 07/15/2012  8:05 AM Date of Discharge: 07/19/2012 Attending Physician: Josem Kaufmann  Discharge Diagnosis: Principal Problem:   NSTEMI (non-ST elevated myocardial infarction)- likely secondary to cocaine abuse Active Problems:   Type II DM   Sleep Apnea   Chronic systolic congestive heart failure- diuresed 24lbs during this admission   HTN (hypertension)   Myasthenia gravis   Cocaine abuse  Discharge Medications:   Medication List         aspirin 81 MG EC tablet  Take 1 tablet (81 mg total) by mouth daily.     carvedilol 3.125 MG tablet  Commonly known as:  COREG  Take 1 tablet (3.125 mg total) by mouth 2 (two) times daily with a meal. For heart problems/HTN     clopidogrel 75 MG tablet  Commonly known as:  PLAVIX  Take 1 tablet (75 mg total) by mouth daily with breakfast.     clorazepate 7.5 MG tablet  Commonly known as:  TRANXENE  Take 1 tablet (7.5 mg total) by mouth daily. Take three tablets by mouth three times a day (1 in the morning and 2 in the evening): For anxiety     digoxin 0.125 MG tablet  Commonly known as:  LANOXIN  Take 1 tablet (0.125 mg total) by mouth daily. For heart problems     hydrALAZINE 25 MG tablet  Commonly known as:  APRESOLINE  Take 1.5 tablets (37.5 mg total) by mouth every 8 (eight) hours.     hydrOXYzine 25 MG tablet  Commonly known as:  ATARAX/VISTARIL  Take 1-2 tablets (25-50 mg total) by mouth 3 (three) times daily as needed for itching or anxiety. Take 1 or 2 tablets by mouth 3 times daily as needed for itching.(Max of 6 tablets a day): For anxiety/itching     isosorbide mononitrate 60 MG 24 hr tablet  Commonly known as:  IMDUR  Take 1 tablet (60 mg total) by mouth daily. For chronic chest pains     metolazone 2.5 MG tablet  Commonly known as:  ZAROXOLYN  Take 1 tablet (2.5 mg total) by mouth daily.     mirtazapine 30 MG tablet   Commonly known as:  REMERON  Take 1 tablet (30 mg total) by mouth at bedtime. For depression/sleep     omeprazole 20 MG capsule  Commonly known as:  PRILOSEC  Take 1 capsule (20 mg total) by mouth daily. For acid reflux     potassium chloride 10 MEQ tablet  Commonly known as:  K-DUR  Take 1 tablet (10 mEq total) by mouth daily. For low potassium     rosuvastatin 10 MG tablet  Commonly known as:  CRESTOR  Take 1 tablet (10 mg total) by mouth daily. For control of high cholesterol     torsemide 20 MG tablet  Commonly known as:  DEMADEX  Take 3 tablets (60 mg total) by mouth daily. For control of high blood pressure/sewllings.        Disposition and follow-up:   [name] was discharged from [facility] in Stable condition.  At the hospital follow up visit please address:  1.  Medication compliance- review what medications patient should be taking 2. Cocaine abuse- discuss with patient the risks of continuing to use cocaine  3. Continue Plavix for 1 year for medical management of NSTEMI and continue ASA 4. Patient has follow up appt  with heart failure clinic  2.  Labs / imaging needed at time of follow-up: none  3.  Pending labs/ test needing follow-up: none  Follow-up Appointments:     Follow-up Information   Follow up with Arvilla Meres, MD On 07/27/2012. (at 2:15 pm)    Contact information:   [address] [place] [address] [phone]       Discharge Instructions: Discharge Orders   Future Appointments Provider Department Dept Phone   07/27/2012 2:15 PM [facility] [phone]   08/02/2012 9:30 AM Darden Palmer, MD  [facility] [phone]   Future Orders Complete By Expires     Call MD for:  persistant dizziness or light-headedness  As directed     Call MD for:  severe uncontrolled pain  As directed     Call MD for:  temperature  >100.4  As directed     Diet - low sodium heart healthy  As directed     Increase activity slowly  As directed        Consultations: cardiology  Procedures Performed:  Dg Chest Portable 1 View  07/15/2012   *RADIOLOGY REPORT*  Clinical Data: Chest pain.  PORTABLE CHEST - 1 VIEW  Comparison: 06/16/2012.  Findings: The heart is mildly enlarged.  There is a right ventricular ICD pacer wire with left-sided generator.  The mediastinal and hilar contours are normal.  There is mild, generalized diffuse interstitial thickening.  No alveolar disease. No pneumothoraces or evident effusions.  IMPRESSION: Cardiomegaly and interstitial thickening consistent with congestive heart failure.   Original Report Authenticated By: Sander Radon, M.D.    2D Echo: 07/18/2012  Study Conclusions  - Left ventricle: The cavity size was moderately dilated. Wall thickness was normal. The estimated ejection fraction was 10%. Diffuse hypokinesis. Doppler parameters are consistent with restrictive physiology, indicative of decreased left ventricular diastolic compliance and/or increased left atrial pressure. Doppler parameters are consistent with high ventricular filling pressure. - Left atrium: The atrium was mildly dilated. - Right ventricle: The cavity size was mildly dilated. Systolic function was moderately reduced. - Right atrium: The atrium was mildly dilated. - Tricuspid valve: Mild-moderate regurgitation. - Pulmonary arteries: Systolic pressure was mildly increased. PA peak pressure: 47mm Hg (S). - Pericardium, extracardiac: A trivial pericardial effusion was identified.  Impressions: - No LV thrombus noted using contrast.  Cardiac Cath:  Procedural Findings:  Hemodynamics:  AO 103/82  LV 101/31  Coronary angiography:  Coronary dominance: right  Left mainstem: No angiographic CAD.  Left anterior descending (LAD): No angiographic CAD.  Left circumflex (LCx): AV LCx with 30% stenosis in the mid-vessel.  There is a large OM1. 40% stenosis at the ostium of the first branch off the OM1. There appeared to be subtotal thrombotic occlusion of the distal portion of the OM1. The vessel was small in caliber at this point.  Right coronary artery (RCA): No angiographic CAD.  Left ventriculography: Not done, elevated creatinine.  Final Conclusions: Subtotal thrombotic occlusion of the distal OM1. The vessel is too small at this point for PCI. Will manage medically. Possibly this represents cocaine-triggered plaque rupture with thrombosis. However, cannot rule embolism from the LV given cardiomyopathy.  Recommendations: Echo with contrast to assess for LV thrombus. Will start Plavix 300 mg load then 75 mg daily. Continue for 1 year for medical treatment of NSTEMI. Needs to stop cocaine.   Admission HPI:  The patient is a 47 YO  man with PMH significant for systolic heart failure (last EF 15%), CKD stage III, HTN, HPL, OSA, obesity, cocaine abuse who comes into the hospital for chest pain. He states that the pain started this morning and was accompanied by sweating. He states it is in the left side of his chest and is crushing. He denied recent fevers or chills. He denies urinary frequency and states that he has been taking his fluid pills but not urinating that much. He states that he was worried about his heart. He is homeless and gets his 3 kids ready for daycare and summer camp in the morning before leaving the shelter (he is not allowed to return until 5PM every day). He denies current cocaine use but states that a friend gave him a black and mild within the last 2 days. He has been taking his medications at home and has all of them right now. He is having SOB and more edema in his legs the last several days. UDS + for cocaine.   Hospital Course by problem list:  1. NSTEMI (non ST segment elevation myocardial infarction)- With patient's history of cocaine abuse as well as the UDS + for cocaine, thought to be due to  vasospasm from cocaine abuse. Troponin of 15 on admission, at which point cardiology was consulted and heparin and nitro drips were started. Given recent cocaine use, cardiology held off on the cardiac cath until 6/30, which showed subtotal thromosis of distal OM1, but the vessel was too small for PCI, so patient was managed medically. Echo performed on 07/18/12 revealed no thrombus within heart chamber, EF 10%. Gradually patient was weaned off of the nitro drip and transitioned to hydralazine and IMDUR, which he takes at home. Per cardiology, patient will be on plavix 75mg  daily x 1 year for medical management of NSTEMI. Beta blocker was held due to cocaine use. All antihypertensives were restarted upon discharge.   2. Acute on chronic systolic heart failure-  Patient has poor cardiac reserve with an EF of 10% (Echo done 07/18/12). At admission he had 1-2+ pitting edema with some crackles at lung bases. During his hospital stay, patient was diuresed with IV lasix, approx 24lbs removed during this admission. Patient was transitioned back to his oral torsemide upon discharge. No ACEi or ARB added to regimen at discharge since patient has poor renal function. Patient follows with Dr. Gala Romney.   3. Type II diabetes mellitus with renal manifestations- Patient has poorly controlled DM with HbA1c 7.9 07/15/12. He does not take medications at home. Patient was managed with SSI and his blood glucose ranged from 95 to 200. Patient's creatinine was elevated at admission and was as high as 1.98 during his stay, but at discharge his Cr was down-trending to 1.8 (baseline approx. 1.6-1.7).   Discharge Vitals:   BP 111/72  Pulse 95  Temp(Src) 98.1 F (36.7 C) (Oral)  Resp 20  Ht 6' (1.829 m)  Wt 93.849 kg (206 lb 14.4 oz)  BMI 28.05 kg/m2  SpO2 100%  Discharge Labs:  Results for orders placed during the hospital encounter of 07/15/12 (from the past 24 hour(s))  GLUCOSE, CAPILLARY     Status: Abnormal   Collection  Time    07/18/12  4:30 PM      Result Value Range   Glucose-Capillary 137 (*) 70 - 99 mg/dL   Comment 1 Notify RN    GLUCOSE, CAPILLARY     Status: Abnormal   Collection Time    07/18/12  9:55 PM      Result Value Range   Glucose-Capillary 197 (*) 70 - 99 mg/dL  GLUCOSE, CAPILLARY     Status: Abnormal   Collection Time    07/19/12  5:30 AM      Result Value Range   Glucose-Capillary 183 (*) 70 - 99 mg/dL  GLUCOSE, CAPILLARY     Status: Abnormal   Collection Time    07/19/12 11:47 AM      Result Value Range   Glucose-Capillary 135 (*) 70 - 99 mg/dL   Comment 1 Notify RN      Signed: Windell Hummingbird, MD 07/19/2012, 3:55 PM   Time Spent on Discharge: 45 minutes Services Ordered on Discharge: none Equipment Ordered on Discharge: none

## 2012-07-19 NOTE — Progress Notes (Addendum)
Subjective: Mr. Prill feels very tired this morning and reports "I slept all day and all night." He was arousable, but his attention was difficult to maintain. Per nurse, he has been like this all night and is frequently uncooperative with procedures and taking medications. Patient reports having a HA and some CP, but thinks it is due to the way he was sleeping, no SOB, N/V, or diaphoresis. Patient feels ready for discharge and plans to return to shelter called [facility].    Objective: Vital signs in last 24 hours: Filed Vitals:   07/18/12 1425 07/18/12 2204 07/19/12 0618 07/19/12 1038  BP: 111/67 94/61 115/76 111/72  Pulse: 92 103 91 95  Temp: 99.1 F (37.3 C) 99.3 F (37.4 C) 98.1 F (36.7 C)   TempSrc: Oral Oral Oral   Resp: 17 18 18 20   Height:      Weight:   93.849 kg (206 lb 14.4 oz)   SpO2: 99% 97% 100% 100%   Weight change: -3.9 kg (-8 lb 9.6 oz)  Intake/Output Summary (Last 24 hours) at 07/19/12 1127 Last data filed at 07/19/12 4098  Gross per 24 hour  Intake    907 ml  Output   2350 ml  Net  -1443 ml   Physical Exam: General: resting in bed, NAD, very lethargic, but arousable with voice  HEENT: PERRL, EOMI, no scleral icterus  Cardiac: RRR, no m/g/r Pulm: CTA B/L, normal work of breathing Abd: soft, nontender, distended, BS present  Ext: warm and well perfused, no BLE edema, good capillary refill Neuro: alert and oriented X3, cranial nerves II-XII grossly intact  Lab Results: Basic Metabolic Panel:  Recent Labs Lab 07/15/12 2355  07/17/12 0440 07/18/12 0415  NA 133*  < > 132* 134*  K 4.0  < > 3.5 4.4  CL 97  < > 97 97  CO2 23  < > 29 27  GLUCOSE 194*  < > 149* 172*  BUN 32*  < > 29* 26*  CREATININE 1.91*  < > 2.00* 1.87*  CALCIUM 8.7  < > 8.3* 8.7  MG 1.8  --   --  1.9  < > = values in this interval not displayed. Liver Function Tests:  Recent Labs Lab 07/15/12 0925  AST 20  ALT 12  ALKPHOS 80  BILITOT 1.4*  PROT 6.9  ALBUMIN 3.1*    CBC:  Recent Labs Lab 07/15/12 0925  07/17/12 0440 07/18/12 0415  WBC 5.4  < > 7.1 6.1  NEUTROABS 3.5  --   --   --   HGB 14.7  < > 14.2 15.2  HCT 43.5  < > 42.0 44.9  MCV 83.7  < > 82.5 84.1  PLT 229  < > 210 231  < > = values in this interval not displayed. Cardiac Enzymes:  Recent Labs Lab 07/15/12 1432 07/15/12 2243 07/16/12 0420  TROPONINI 15.17* >20.00* >20.00*   Urine Drug Screen: Drugs of Abuse     Component Value Date/Time   LABOPIA NONE DETECTED 07/15/2012 0926   LABOPIA NEG 02/01/2012 1356   COCAINSCRNUR POSITIVE* 07/15/2012 0926   COCAINSCRNUR PPS 02/01/2012 1356   LABBENZ NONE DETECTED 07/15/2012 0926   LABBENZ PPS 02/01/2012 1356   LABBENZ NEG 07/09/2010 1522   AMPHETMU NONE DETECTED 07/15/2012 0926   AMPHETMU NEG 07/09/2010 1522   THCU NONE DETECTED 07/15/2012 0926   LABBARB NONE DETECTED 07/15/2012 0926   LABBARB NEG 02/01/2012 1356    Medications: I have reviewed the  patient's current medications. Scheduled Meds: . aspirin EC  81 mg Oral Daily  . clopidogrel  75 mg Oral Q breakfast  . clorazepate  7.5 mg Oral Daily  . digoxin  0.125 mg Oral Daily  . docusate sodium  100 mg Oral BID  . heparin  5,000 Units Subcutaneous Q8H  . hydrALAZINE  37.5 mg Oral Q8H  . insulin aspart  0-15 Units Subcutaneous TID WC  . isosorbide mononitrate  30 mg Oral Daily  . mirtazapine  30 mg Oral QHS  . pantoprazole  40 mg Oral Daily  . potassium chloride  40 mEq Oral BID  . senna  1 tablet Oral BID  . sodium chloride  3 mL Intravenous Q12H  . sodium chloride  3 mL Intravenous Q12H  . torsemide  60 mg Oral Daily   Continuous Infusions: . sodium chloride 10 mL/hr at 07/17/12 2000   PRN Meds:.sodium chloride, acetaminophen, ondansetron (ZOFRAN) IV, ondansetron, sodium chloride  Assessment/Plan: Mr. Breau is a 47 year old male with PMH of CHF EF 10%, CKD stage III, HTN, OSA, and cocaine abuse admitted for NSTEMI likely 2/2 to cocaine.   NSTEMI (non-ST elevated  myocardial infarction): Troponins increased to greater than 20 . Started on heparin and nitro drip.  Concern for cocaine induced vasospasm, so cath not done initially.  Possible cocaine mediated vasospasm vs. Cocaine-related plaque rupture with MI. LHC completed yesterday. Chest pain resolved. -appreciate cardiology following, seen by Dr. Shirlee Latch -LHC done yesterday- subtotal thrombotic occulsion of the distal OM1. The vessel is too small for PCI, will manage medically.  -stopped IV lasix, began torsemide PO today -continue to hold BB in setting of recent cocaine use - hydralazine 25mg  TID -monitor electrolytes and replace as needed -ECHO with contrast completed 7/1, no clot detected. EF 10%.  -per cardiology, will rx plavix 75mg  daily x 1 year upon discharge  Acute on chronic systolic heart failure : Echo 01/6107: severely dilated LV, mild LVH, EF 15%, diffuse hypokinesis, high ventricular filling pressure, left atrium mildly dilated, RV moderately dilated, mildly to moderately reduced systolic function, right atrium mildly dilated, PA peak pressure .  Follows with dr. Gala Romney per patient.  Home regimen includes: Coreg 3.125mg  BID, digoxin 0.125mg  qd, hydralazine 25mg  TID, IMDUR 60mg  qd, metolazone 2.5mg  qd, and torsemide 60mg  qd.  Unable to afford ACE-I in the past but likely should be on one as well.  -restarted hydralazine and transition to IMDUR -hold BB in setting of cocaine use -ECHO with contrast completed 7/1, no clot detected. EF 10% -Pharmacy recs ARB or ACEi, but given his CKD the risk may outweigh the benefit and with his history of noncompliance with medications, we will not start this medication at this time  Type II or unspecified type diabetes mellitus with renal manifestations: no home medications for diabetes.  HbA1C 7.9 07/15/12.  Cr. 1.86 on admission.   -cardiac diet, continue SSI moderate at this time -cbg monitoring  CKD--stage 3: Cr on admission 1.86, as high as 1.98  during this admission, but improving today at 1.81.  Baseline Cr 1.6-1.7 since 04/2010.   SLEEP APNEA, OBSTRUCTIVE, MILD -continue CPAP  HTN (hypertension)--Home regimen includes: Coreg 3.125mg  BID, digoxin 0.125mg  qd, hydralazine 25mg  TID, IMDUR 60mg  qd, metolazone 2.5mg  qd, and torsemide 60mg  qd.  BP stable for now.   -continue to monitor -d/c nitro drip  -resumed IMDUR and hydralazine  Cocaine abuse - Patient denies but consistent UDS + cocaine on admission  -cessation strongly advised and  counseling given  Diet: cardiac DVT Ppx: Heparin gtt Dispo: Discharge back to homeless shelter today.   The patient does have a current PCP Darden Palmer, MD) and does need an [facility] hospital follow-up appointment after discharge.  The patient does have transportation limitations that hinder transportation to clinic appointments.  .Services Needed at time of discharge: Y = Yes, Blank = No PT:   OT:   RN:   Equipment:   Other:     LOS: 4 days   Windell Hummingbird, MD 07/19/2012, 11:27 AM

## 2012-07-19 NOTE — Progress Notes (Signed)
Spoke with patient who states he has been residing at a shelter for the past month called [facility]- prior to that he was staying at [facility]- patient reports he plans to return to the shelter at d/c- he also reports he is working with the [employer] for long term  housing options. Reece Levy, MSW [phone]

## 2012-07-19 NOTE — Progress Notes (Signed)
CARDIAC REHAB PHASE I   PRE:  Rate/Rhythm: 95 SR   BP:  Sitting: 111/72     SaO2: 100 RA  MODE:  Ambulation: 460 ft   POST:  Rate/Rhythm: 104 SR  BP:  Sitting: 112/72     SaO2: 100 RA  10:35AM-11:40AM Patient walked very well.  His gait was steady and he walked at a good pace.  Patient did not want to walk any further so we went back to his room after 460 feet. Due to his social situation cardiac rehab would not be appropriate.    Theresa Duty, [place] 07/19/2012 11:36 AM

## 2012-07-19 NOTE — Clinical Social Work Psychosocial (Signed)
     Clinical Social Work Department BRIEF PSYCHOSOCIAL ASSESSMENT 07/19/2012  Patient:  [name], [name]     Account Number:  [id number]     Admit date:  07/15/2012  Clinical Social Worker:  Tiburcio Pea  Date/Time:  07/19/2012 02:00 PM  Referred by:  Physician  Date Referred:  07/18/2012 Referred for  Homelessness  Substance Abuse   Other Referral:   Interview type:  Patient Other interview type:    PSYCHOSOCIAL DATA Living Status:  OTHER Admitted from facility:   Level of care:   Primary support name:  Refused to list anyone Primary support relationship to patient:  NONE Degree of support available:   Pateint is homeless and has been staying in shelter.    CURRENT CONCERNS Current Concerns  None Noted   Other Concerns:    SOCIAL WORK ASSESSMENT / PLAN CSW met with pateint today prior to d/c per MD.  Patient was noted to be extremely sleepy and hard to arouse- he barely opened his eyes for CSW despite continued attempts to have him to wake up. Patient finally spoke to CSW stating that he plans to return to [facility] and that he has contacted them and bed is available.  He has a ride to come and pick him up.  The HF team has arranged for medication assistance for patient- he will need to stop by the outpatient [facility] to pick up meds. Patient was instructed as to where the pharmacy is. CSW then attempted to talk to patient about current cocaine use.  He closed his eyes and would not respond to CSW at this point and would not arouse again.  CSW verbalized offered support and resources but patient would not respond.  Notified patient's nurse Paris of above.  She stated that prior to leaving he was awake and talkative.   Assessment/plan status:  No Further Intervention Required Other assessment/ plan:   Information/referral to community resources:   Patient would not consent to providing resources for substance abuse    PATIENTS/FAMILYS RESPONSE TO  PLAN OF CARE: Patient appeared to be oriented and would open his eyes. Nursing reports that he is alert and oriented all the time but apparantly did not wish to speak with CSW. MD d/c'd patient today- he will return to Shelter.  No furhter CSW needs identified.  CSW signing off.  Lorri Frederick. Maleeah Crossman, LCSWA  [phone]

## 2012-07-24 ENCOUNTER — Ambulatory Visit: Payer: Self-pay | Admitting: Internal Medicine

## 2012-07-27 ENCOUNTER — Other Ambulatory Visit: Payer: Self-pay

## 2012-07-27 ENCOUNTER — Ambulatory Visit (HOSPITAL_COMMUNITY): Payer: Medicare Other | Attending: Internal Medicine

## 2012-07-28 ENCOUNTER — Ambulatory Visit (HOSPITAL_COMMUNITY): Payer: Self-pay | Admitting: Psychiatry

## 2012-08-02 ENCOUNTER — Ambulatory Visit (INDEPENDENT_AMBULATORY_CARE_PROVIDER_SITE_OTHER): Payer: Medicare Other | Admitting: Internal Medicine

## 2012-08-02 ENCOUNTER — Encounter: Payer: Self-pay | Admitting: Internal Medicine

## 2012-08-02 VITALS — BP 120/82 | HR 95 | Temp 97.0°F | Ht 70.25 in | Wt 227.8 lb

## 2012-08-02 DIAGNOSIS — R51 Headache: Secondary | ICD-10-CM

## 2012-08-02 DIAGNOSIS — F1410 Cocaine abuse, uncomplicated: Secondary | ICD-10-CM

## 2012-08-02 DIAGNOSIS — I509 Heart failure, unspecified: Secondary | ICD-10-CM

## 2012-08-02 DIAGNOSIS — I5022 Chronic systolic (congestive) heart failure: Secondary | ICD-10-CM

## 2012-08-02 DIAGNOSIS — N179 Acute kidney failure, unspecified: Secondary | ICD-10-CM

## 2012-08-02 DIAGNOSIS — I10 Essential (primary) hypertension: Secondary | ICD-10-CM

## 2012-08-02 DIAGNOSIS — E1129 Type 2 diabetes mellitus with other diabetic kidney complication: Secondary | ICD-10-CM

## 2012-08-02 DIAGNOSIS — N058 Unspecified nephritic syndrome with other morphologic changes: Secondary | ICD-10-CM

## 2012-08-02 MED ORDER — FUROSEMIDE 20 MG PO TABS
40.0000 mg | ORAL_TABLET | Freq: Once | ORAL | Status: AC
  Administered 2012-08-02: 40 mg via ORAL

## 2012-08-02 NOTE — Patient Instructions (Addendum)
General Instructions:  Your weight is up today, please take your medications as prescribed and follow up with heart failure team tomorrow!  We are referring you to headache clinic  If your chest pain returns, you will need to follow up with cardiology or go directly to the Emergency room  We have given you a sample of Lantus insulin today, take 10 units at bedtime.  (please check your blood sugars before meals    Treatment Goals:  Goals (1 Years of Data) as of 08/02/12   None      Progress Toward Treatment Goals:  Treatment Goal 08/02/2012  Hemoglobin A1C unable to assess  Blood pressure at goal  Stop smoking stopped smoking  Prevent falls at goal   Self Care Goals & Plans:  Self Care Goal 08/02/2012  Manage my medications take my medicines as prescribed; bring my medications to every visit; refill my medications on time; follow the sick day instructions if I am sick  Monitor my health check my feet daily  Eat healthy foods eat more vegetables; eat fruit for snacks and desserts; eat foods that are low in salt; eat smaller portions  Be physically active take a walk every day; find an activity I enjoy    Home Blood Glucose Monitoring 08/02/2012  Check my blood sugar 3 times a day  When to check my blood sugar before meals    Care Management & Community Referrals:  Referral 07/10/2012  Referrals made for care management support -  Referrals made to community resources noncommercial health insurance options; transportation

## 2012-08-02 NOTE — Assessment & Plan Note (Signed)
BP Readings from Last 3 Encounters:  08/02/12 120/82  07/19/12 111/72  07/19/12 111/72   Lab Results  Component Value Date   NA 134* 07/18/2012   K 4.4 07/18/2012   CREATININE 1.87* 07/18/2012   Assessment: Blood pressure control: controlled Progress toward BP goal:  at goal  Plan: Medications:  continue current medications coreg 3.125mg  bid (although unsure if taking and also with hx of cocacine use caution), digoxin, hydralazine tid, imdur 60mg , metoalzone (unclear if taking), and torsemide 60mg  qd Educational resources provided: brochure;handout;video Self management tools provided:   Other plans: given lasix 40mg  po x1 in opc today

## 2012-08-02 NOTE — Assessment & Plan Note (Signed)
Cr down ton 1.87 on 07/18/12.  Was to repeat labs today, still on torsemide, but he left without getting his lab work done.  Hopefully will get labs drawn tomorrow during HF appt  -bmet needs to be done 

## 2012-08-02 NOTE — Assessment & Plan Note (Signed)
Denies recent use of cocaine since prior to hospital admission.    -encouraged cessation

## 2012-08-02 NOTE — Assessment & Plan Note (Signed)
Lab Results  Component Value Date   HGBA1C 7.9* 07/15/2012   HGBA1C 7.3 05/10/2012   HGBA1C 5.4 12/27/2011    Assessment: Diabetes control: fair control Progress toward A1C goal:  unable to assess Comments: was apparently on glipizide and/or lantus in the past.  Noted to have non-compliance with glipizide and unclear if ever restarted.  Does reports compliance and success with lantus in the past.    Plan: Medications:  restart lantus 10 units qhs today, if unable to afford insulin or non-complaince continues, may consider trying to restart glipizide instead Home glucose monitoring: Frequency: 3 times a day Timing: before meals Instruction/counseling given: reminded to bring blood glucose meter & log to each visit, reminded to bring medications to each visit, discussed foot care and discussed diet Educational resources provided: brochure;handout Self management tools provided:   Other plans: cbg 300s today, not on any insulin regimen since discharge. Will start lantus 10 units qhs for now, samples, meter and strips supplied in clinic today.

## 2012-08-02 NOTE — Assessment & Plan Note (Addendum)
S/p recent hospital admission.  Did not follow up with heart failure clinic on 07/27/12 due to lack of transportation; reschedule for tomorrow morning.  Weight back up ~20 lbs today but now b/l pitting edema or crackles on physical exam.  Reports chest pressure occasionally at night that resolves during the day.  Reports compliance with medications including torsemide and plavix recently but unclear if taking metolazone and coreg.   -received lasix 40mg  po x1 in clinic today -follow up heart failure clinic tomorrow morning -weight today up to 224 and was 206lb on hospital discharge 07/19/12 -urged medication compliance and cardiology follow up.  If sob, chest pain, diaphoresis, advised to return to ED right away affordability for medications is big issue as he is currently homeless living in a shelter
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
## 2012-08-02 NOTE — Assessment & Plan Note (Signed)
Cr down ton 1.87 on 07/18/12.  Was to repeat labs today, still on torsemide, but he left without getting his lab work done.  Hopefully will get labs drawn tomorrow during HF appt  -bmet needs to be done

## 2012-08-02 NOTE — Assessment & Plan Note (Addendum)
Reports improvement with percocet in the past but due to drug hx and lack of funds not eligible.  Has been referred to headache clinic in the past but has not followed up with appointments. Will attempt to refer again with hopes that he will go to his appointment and be seen.  He reports mild improvement with NSAIDs and hx of migraines.  Has to turn down the lights and says starts in frontal region and radiating to b/l temples.  -f/u headache clinic referral

## 2012-08-02 NOTE — Progress Notes (Signed)
Wt rechecked with no shoes 224.2lb 10AM. Stanton Kidney Guilherme Schwenke RN 08/02/12 10:05AM

## 2012-08-03 ENCOUNTER — Ambulatory Visit (HOSPITAL_BASED_OUTPATIENT_CLINIC_OR_DEPARTMENT_OTHER)
Admission: RE | Admit: 2012-08-03 | Discharge: 2012-08-03 | Disposition: A | Discharge status: Home / Self Care | Payer: Medicare Other | Source: Ambulatory Visit | Attending: Cardiology | Admitting: Cardiology

## 2012-08-03 VITALS — BP 110/78 | HR 106 | Temp 98.2°F | Wt 228.8 lb

## 2012-08-03 DIAGNOSIS — I5022 Chronic systolic (congestive) heart failure: Secondary | ICD-10-CM

## 2012-08-03 DIAGNOSIS — I509 Heart failure, unspecified: Secondary | ICD-10-CM

## 2012-08-03 DIAGNOSIS — N189 Chronic kidney disease, unspecified: Secondary | ICD-10-CM

## 2012-08-03 DIAGNOSIS — F1410 Cocaine abuse, uncomplicated: Secondary | ICD-10-CM

## 2012-08-03 LAB — BASIC METABOLIC PANEL
Chloride: 99 mEq/L (ref 96–112)
Creatinine, Ser: 2.07 mg/dL — ABNORMAL HIGH (ref 0.50–1.35)
GFR calc Af Amer: 43 mL/min — ABNORMAL LOW (ref >90)
Potassium: 4.8 mEq/L (ref 3.5–5.1)
Sodium: 132 mEq/L — ABNORMAL LOW (ref 135–145)

## 2012-08-03 MED ORDER — FUROSEMIDE 10 MG/ML IJ SOLN
80.0000 mg | Freq: Once | INTRAMUSCULAR | Status: AC
  Administered 2012-08-03: 80 mg via INTRAVENOUS
  Filled 2012-08-03: qty 8

## 2012-08-03 MED ORDER — POTASSIUM CHLORIDE CRYS ER 20 MEQ PO TBCR
40.0000 meq | EXTENDED_RELEASE_TABLET | Freq: Once | ORAL | Status: AC
  Administered 2012-08-03: 40 meq via ORAL
  Filled 2012-08-03: qty 2

## 2012-08-03 MED ORDER — POTASSIUM CHLORIDE CRYS ER 20 MEQ PO TBCR: 40.0000 meq | EXTENDED_RELEASE_TABLET | Freq: Once | ORAL | Status: DC

## 2012-08-03 MED ORDER — FUROSEMIDE 10 MG/ML IJ SOLN: 80.0000 mg | Freq: Once | INTRAMUSCULAR | Status: DC

## 2012-08-03 NOTE — Progress Notes (Signed)
Pt had 775 cc of urine output, per Dr Shirlee Latch pt can go and f/u next week, IV d/c'd with catheter intact, instructions reviewed w/pt, he was given f/u appt for 7/24 at 10:10 and verbalized understanding of medications

## 2012-08-03 NOTE — Progress Notes (Signed)
22 gauge IV started by Aurea Graff, RN 80 mg IV lasix given along with 40 meq of PO Potassium, pt tolerated well.

## 2012-08-03 NOTE — Progress Notes (Signed)
Case discussed with Dr. Qureshi soon after the resident saw the patient.  We reviewed the resident's history and exam and pertinent patient test results.  I agree with the assessment, diagnosis, and plan of care documented in the resident's note. 

## 2012-08-03 NOTE — Progress Notes (Signed)
Patient ID: CHILTON SALLADE, male   DOB: [birth year], 47 y.o.   MRN: [id number] Psychiatrist: Dr Anthony Sar PCP: Dr Loistine Chance  HPI: Mr. Capek is a 47 y/o male with HTN and DM2 with longstanding HF due to NICM (probably ETOH), EF 20%. Cath in 2008 showed left circumflex with 40% stenosis. Baseline weight about 210. Was previously on [facility] but this ended in December in 2013 as he was doing well.   Admitted 03/2011 with NYHA Class IV symptoms, requiring milrinone therapy and IV lasix. Cath 04/13/11: cardiogenic shock with biventricular failure (R>L) and possible restrictive physiology. RA = 22 with steep y-descents, RV = 45/12/25, PA = 51/25 (34), PCW = 18-20, Fick cardiac output/index = 3.1/1.4, Thermo CO/CI = 3.6/1.6, PVR = 4.8 [place] O2 sat = 85% (after sedation), PA sat = 31%, 36%. Milrinone was initiated for low output physiology.   Spironolactone stopped due to hyperkalemia. Benazepril and lopressor stopped due to hypotension.  Follow up: was in hospital in 6/14 witih CP/elevated troponin and increased SOB. He had a +USD for cocaine. He also had LHC 07/17/12 showing subtotal thrombotic occlusion of the distal OM1. It was too small at this point for PCI and was started on plavix.  Echo showed EF 10% with no LV thrombus and moderately decreased RV systolic function.  His dc weight was 206 and now 228 lbs, though this is lower than 230 which was his weight at last appointment in this office. Taking NSAIDs for headaches. Seen by PCP yesterday and extra 40 mg lasix PO given. Still homeless. Complains of extreme tiredness, not getting enough rest. + orthopnea. +exertional SOB. +CP at night. Reports taking HF medications as prescribed.  He is not taking metolazone.   Labs (7/14): K 4.8, creatinine 2  SH: Homeless, 2 children, lives in [place].  + cocaine abuse.  ROS: All systems negative except as listed in HPI, PMH and Problem List.  Past Medical History  Diagnosis Date  . Hypertension   . CHF  (congestive heart failure)     Dilated NICM, likely secondary to prior heavy alcohol usage, EF 20-25%, s/p AICD placement  04/2007  . History of peptic ulcer disease     EGD 09/2007 shows prox gastric ulcer with black eschar, treated with PPI  . Depression   . History of alcohol abuse     quit approx 2011-2012  . Angina   . Shortness of breath   . OSA (obstructive sleep apnea)     sleeps with oxygen  . ICD (implantable cardiac defibrillator) in place   . Diabetes mellitus     insulin dependent  . GERD (gastroesophageal reflux disease)   . Headache(784.0)   . Myasthenia gravis 10/12/2011    Diagnosed in 09/2011. Received Plasmapheresis. CT chest no Thymoma. Acetylcholine receptor antibody negative.    . CKD (chronic kidney disease) stage 3, GFR 30-59 ml/min   . Myasthenia gravis   - NSTEMI (6/14) with subtotal thrombotic occlusion of a distal OM1, too small for intervention.  No LV thrombus on echo.   Current Outpatient Prescriptions  Medication Sig Dispense Refill  . clopidogrel (PLAVIX) 75 MG tablet Take 1 tablet (75 mg total) by mouth daily with breakfast.  30 tablet  11  . clorazepate (TRANXENE) 7.5 MG tablet Take 1 tablet (7.5 mg total) by mouth daily. Take three tablets by mouth three times a day (1 in the morning and 2 in the evening): For anxiety  30 tablet  3  . digoxin (LANOXIN)  0.125 MG tablet Take 1 tablet (0.125 mg total) by mouth daily. For heart problems      . hydrALAZINE (APRESOLINE) 25 MG tablet Take 1.5 tablets (37.5 mg total) by mouth every 8 (eight) hours.  60 tablet  3  . hydrOXYzine (ATARAX/VISTARIL) 25 MG tablet Take 1-2 tablets (25-50 mg total) by mouth 3 (three) times daily as needed for itching or anxiety. Take 1 or 2 tablets by mouth 3 times daily as needed for itching.(Max of 6 tablets a day): For anxiety/itching  30 tablet  0  . isosorbide mononitrate (IMDUR) 60 MG 24 hr tablet Take 1 tablet (60 mg total) by mouth daily. For chronic chest pains      . metolazone  (ZAROXOLYN) 2.5 MG tablet Take 1 tablet (2.5 mg total) by mouth daily.  3 tablet  0  . mirtazapine (REMERON) 30 MG tablet Take 1 tablet (30 mg total) by mouth at bedtime. For depression/sleep      . omeprazole (PRILOSEC) 20 MG capsule Take 1 capsule (20 mg total) by mouth daily. For acid reflux      . potassium chloride (K-DUR) 10 MEQ tablet Take 1 tablet (10 mEq total) by mouth daily. For low potassium      . rosuvastatin (CRESTOR) 10 MG tablet Take 1 tablet (10 mg total) by mouth daily. For control of high cholesterol      . torsemide (DEMADEX) 20 MG tablet Take 3 tablets (60 mg total) by mouth daily. For control of high blood pressure/sewllings.      . carvedilol (COREG) 3.125 MG tablet Take 1 tablet (3.125 mg total) by mouth 2 (two) times daily with a meal. For heart problems/HTN      . [DISCONTINUED] metoprolol tartrate (LOPRESSOR) 25 MG tablet Take 1 tablet (25 mg total) by mouth 2 (two) times daily.  60 tablet  1  . [DISCONTINUED] traZODone (DESYREL) 25 mg TABS Take 0.5 tablets (25 mg total) by mouth at bedtime.  30 tablet  1   No current facility-administered medications for this encounter.     PHYSICAL EXAM: Filed Vitals:   08/03/12 0943  BP: 110/78  Pulse: 106  Temp: 98.2 F (36.8 C)  TempSrc: Oral  Weight: 228 lb 12 oz (103.76 kg)  SpO2: 100%    General:  Chronically ill appearing.  No resp difficulty; very fatigued HEENT: normal Neck: supple. JVP 12-14 Carotids 2+ bilaterally; no bruits. No lymphadenopathy or thryomegaly appreciated. Cor: PMI normal. Regular rate & rhythm. 2/6 systolic murmur LLSB Lungs: clear Abdomen: obese, soft, nontender, mildly distended. No hepatosplenomegaly. No bruits or masses. Good bowel sounds. Extremities: no cyanosis, clubbing, rash, R and LLE 1+ edema Neuro: alert & orientedx3, cranial nerves grossly intact. Moves all 4 extremities w/o difficulty. Affect pleasant.  ASSESSMENT & PLAN: 1. CHF: Chronic systolic CHF, nonischemic  cardiomyopathy.  Possibly related to cocaine abuse. Since discharge, his weight has increased.  He has been taking torsemide 60 mg daily.  He is not taking metolazone.  He is volume overloaded on exam and symptomatic.  - Lasix 80 mg IV x 1 to be given in the clinic today.  - Increase torsemide to 80 mg daily and increase KCl to 20 mEq daily.  - Add metolazone 2.5 mg 30 minutes pre-torsemide on Mondays and Fridays (starting tomorrow).  Take KCl 40 mEq on metolazone days.  - BMET/BNP today and at followup in 1 week.  - No ACEI with CKD, continue hydralazine/Imdur at current dose.  - Continue digoxin, will  need to check level at followup.  - Can continue Coreg at low dose for now.   2. CAD: Thrombotic occlusion of distal OM1 noted on cath after recent presentation with NSTEMI (too small caliber for intervention).  He was UDS + for cocaine.  Suspect he may have had a cocaine-triggered plaque rupture.  There was no thrombus in the LV cavity on echo.  Continue Plavix for medical management of NSTEMI.  Need to make sure he is on ASA 81.  Continue statin.  Check lipids next appointment.  3. Substance abuse: I counseled him to stay away from cocaine.  4. CKD: Creatinine has been stable, 1.9-2.  Patient will followup in 1 week.  There is some confusion about his meds.  He will bring them with him.   [name] 08/04/2012

## 2012-08-03 NOTE — Patient Instructions (Signed)
Increase torsemide to 80 mg (4 tablets) daily.  Start taking metolazone 2.5 mg on Mondays and Fridays 30 minutes before taking torsemide.  Increase daily dose of potassium to 20 meq daily, except on Monday and Friday take 40 meq potassium.  Please bring your medications with you to the next visit.

## 2012-08-04 ENCOUNTER — Telehealth: Payer: Self-pay | Admitting: *Deleted

## 2012-08-04 ENCOUNTER — Inpatient Hospital Stay (HOSPITAL_COMMUNITY)
Admission: EM | Admit: 2012-08-04 | Discharge: 2012-08-09 | DRG: 917 | Disposition: A | Discharge status: Home / Self Care | Payer: Medicare Other | Attending: Internal Medicine | Admitting: Internal Medicine

## 2012-08-04 ENCOUNTER — Other Ambulatory Visit: Payer: Self-pay

## 2012-08-04 ENCOUNTER — Encounter (HOSPITAL_COMMUNITY): Payer: Self-pay | Admitting: *Deleted

## 2012-08-04 ENCOUNTER — Emergency Department (HOSPITAL_COMMUNITY): Payer: Medicare Other

## 2012-08-04 DIAGNOSIS — T43601A Poisoning by unspecified psychostimulants, accidental (unintentional), initial encounter: Secondary | ICD-10-CM | POA: Diagnosis present

## 2012-08-04 DIAGNOSIS — I129 Hypertensive chronic kidney disease with stage 1 through stage 4 chronic kidney disease, or unspecified chronic kidney disease: Secondary | ICD-10-CM | POA: Diagnosis present

## 2012-08-04 DIAGNOSIS — T405X1A Poisoning by cocaine, accidental (unintentional), initial encounter: Principal | ICD-10-CM | POA: Diagnosis present

## 2012-08-04 DIAGNOSIS — K219 Gastro-esophageal reflux disease without esophagitis: Secondary | ICD-10-CM | POA: Diagnosis present

## 2012-08-04 DIAGNOSIS — R531 Weakness: Secondary | ICD-10-CM

## 2012-08-04 DIAGNOSIS — F1410 Cocaine abuse, uncomplicated: Secondary | ICD-10-CM | POA: Diagnosis present

## 2012-08-04 DIAGNOSIS — F1411 Cocaine abuse, in remission: Secondary | ICD-10-CM | POA: Diagnosis present

## 2012-08-04 DIAGNOSIS — Z91199 Patient's noncompliance with other medical treatment and regimen due to unspecified reason: Secondary | ICD-10-CM

## 2012-08-04 DIAGNOSIS — Z5900 Homelessness unspecified: Secondary | ICD-10-CM

## 2012-08-04 DIAGNOSIS — I10 Essential (primary) hypertension: Secondary | ICD-10-CM

## 2012-08-04 DIAGNOSIS — I5022 Chronic systolic (congestive) heart failure: Secondary | ICD-10-CM | POA: Diagnosis present

## 2012-08-04 DIAGNOSIS — E119 Type 2 diabetes mellitus without complications: Secondary | ICD-10-CM | POA: Diagnosis present

## 2012-08-04 DIAGNOSIS — Z9119 Patient's noncompliance with other medical treatment and regimen: Secondary | ICD-10-CM

## 2012-08-04 DIAGNOSIS — I2510 Atherosclerotic heart disease of native coronary artery without angina pectoris: Secondary | ICD-10-CM | POA: Diagnosis present

## 2012-08-04 DIAGNOSIS — J189 Pneumonia, unspecified organism: Secondary | ICD-10-CM

## 2012-08-04 DIAGNOSIS — G4733 Obstructive sleep apnea (adult) (pediatric): Secondary | ICD-10-CM | POA: Diagnosis present

## 2012-08-04 DIAGNOSIS — I214 Non-ST elevation (NSTEMI) myocardial infarction: Secondary | ICD-10-CM | POA: Diagnosis present

## 2012-08-04 DIAGNOSIS — I5021 Acute systolic (congestive) heart failure: Secondary | ICD-10-CM

## 2012-08-04 DIAGNOSIS — Z95810 Presence of automatic (implantable) cardiac defibrillator: Secondary | ICD-10-CM

## 2012-08-04 DIAGNOSIS — I5023 Acute on chronic systolic (congestive) heart failure: Secondary | ICD-10-CM

## 2012-08-04 DIAGNOSIS — E1129 Type 2 diabetes mellitus with other diabetic kidney complication: Secondary | ICD-10-CM

## 2012-08-04 DIAGNOSIS — N183 Acute kidney failure, unspecified: Secondary | ICD-10-CM | POA: Diagnosis present

## 2012-08-04 DIAGNOSIS — Z515 Encounter for palliative care: Secondary | ICD-10-CM

## 2012-08-04 DIAGNOSIS — N179 Acute kidney failure, unspecified: Secondary | ICD-10-CM

## 2012-08-04 DIAGNOSIS — I509 Heart failure, unspecified: Secondary | ICD-10-CM | POA: Diagnosis present

## 2012-08-04 HISTORY — DX: Atherosclerotic heart disease of native coronary artery without angina pectoris: I25.10

## 2012-08-04 HISTORY — DX: Peptic ulcer, site unspecified, unspecified as acute or chronic, without hemorrhage or perforation: K27.9

## 2012-08-04 HISTORY — DX: Other psychoactive substance abuse, uncomplicated: F19.10

## 2012-08-04 HISTORY — DX: Solitary pulmonary nodule: R91.1

## 2012-08-04 LAB — RAPID URINE DRUG SCREEN, HOSP PERFORMED
Barbiturates: NOT DETECTED
Cocaine: POSITIVE — AB
Tetrahydrocannabinol: NOT DETECTED

## 2012-08-04 LAB — BASIC METABOLIC PANEL
CO2: 23 mEq/L (ref 19–32)
GFR calc non Af Amer: 41 mL/min — ABNORMAL LOW (ref >90)
Glucose, Bld: 145 mg/dL — ABNORMAL HIGH (ref 70–99)
Potassium: 5 mEq/L (ref 3.5–5.1)
Sodium: 133 mEq/L — ABNORMAL LOW (ref 135–145)

## 2012-08-04 LAB — CBC
Hemoglobin: 14.3 g/dL (ref 13.0–17.0)
MCHC: 33.7 g/dL (ref 30.0–36.0)
RBC: 5.04 MIL/uL (ref 4.22–5.81)
WBC: 7.7 10*3/uL (ref 4.0–10.5)

## 2012-08-04 LAB — POCT I-STAT TROPONIN I: Troponin i, poc: 0.28 ng/mL (ref 0.00–0.08)

## 2012-08-04 LAB — APTT: aPTT: 30 seconds (ref 24–37)

## 2012-08-04 MED ORDER — CLOPIDOGREL BISULFATE 75 MG PO TABS
75.0000 mg | ORAL_TABLET | Freq: Every day | ORAL | Status: DC
  Administered 2012-08-05 – 2012-08-09 (×5): 75 mg via ORAL
  Filled 2012-08-04 (×6): qty 1

## 2012-08-04 MED ORDER — DIGOXIN 125 MCG PO TABS
0.1250 mg | ORAL_TABLET | Freq: Every day | ORAL | Status: DC
  Administered 2012-08-05 – 2012-08-09 (×5): 0.125 mg via ORAL
  Filled 2012-08-04 (×5): qty 1

## 2012-08-04 MED ORDER — PANTOPRAZOLE SODIUM 40 MG PO TBEC
40.0000 mg | DELAYED_RELEASE_TABLET | Freq: Every day | ORAL | Status: DC
  Administered 2012-08-05 – 2012-08-09 (×5): 40 mg via ORAL
  Filled 2012-08-04 (×5): qty 1

## 2012-08-04 MED ORDER — ISOSORBIDE MONONITRATE ER 60 MG PO TB24
60.0000 mg | ORAL_TABLET | Freq: Every day | ORAL | Status: DC
  Administered 2012-08-05 – 2012-08-09 (×5): 60 mg via ORAL
  Filled 2012-08-04 (×5): qty 1

## 2012-08-04 MED ORDER — HYDROXYZINE HCL 25 MG PO TABS
25.0000 mg | ORAL_TABLET | Freq: Three times a day (TID) | ORAL | Status: DC | PRN
  Filled 2012-08-04: qty 2

## 2012-08-04 MED ORDER — ONDANSETRON HCL 4 MG/2ML IJ SOLN: 4.0000 mg | Freq: Four times a day (QID) | INTRAMUSCULAR | Status: DC | PRN

## 2012-08-04 MED ORDER — FUROSEMIDE 10 MG/ML IJ SOLN
80.0000 mg | Freq: Three times a day (TID) | INTRAMUSCULAR | Status: DC
  Administered 2012-08-05 (×2): 80 mg via INTRAVENOUS
  Filled 2012-08-04 (×5): qty 8

## 2012-08-04 MED ORDER — HEPARIN (PORCINE) IN NACL 100-0.45 UNIT/ML-% IJ SOLN
1600.0000 [IU]/h | INTRAMUSCULAR | Status: DC
  Administered 2012-08-04 – 2012-08-05 (×2): 1600 [IU]/h via INTRAVENOUS
  Filled 2012-08-04 (×3): qty 250

## 2012-08-04 MED ORDER — ASPIRIN EC 81 MG PO TBEC
81.0000 mg | DELAYED_RELEASE_TABLET | Freq: Every day | ORAL | Status: DC
  Administered 2012-08-05 – 2012-08-09 (×5): 81 mg via ORAL
  Filled 2012-08-04 (×5): qty 1

## 2012-08-04 MED ORDER — HEPARIN BOLUS VIA INFUSION
4000.0000 [IU] | Freq: Once | INTRAVENOUS | Status: AC
  Administered 2012-08-04: 4000 [IU] via INTRAVENOUS

## 2012-08-04 MED ORDER — ATORVASTATIN CALCIUM 80 MG PO TABS
80.0000 mg | ORAL_TABLET | Freq: Every day | ORAL | Status: DC
  Administered 2012-08-05 – 2012-08-08 (×4): 80 mg via ORAL
  Filled 2012-08-04 (×5): qty 1

## 2012-08-04 MED ORDER — ACETAMINOPHEN 325 MG PO TABS: 650.0000 mg | ORAL_TABLET | ORAL | Status: DC | PRN

## 2012-08-04 MED ORDER — NITROGLYCERIN 0.4 MG SL SUBL
0.4000 mg | SUBLINGUAL_TABLET | SUBLINGUAL | Status: DC | PRN
  Administered 2012-08-05: 0.4 mg via SUBLINGUAL
  Filled 2012-08-04: qty 25

## 2012-08-04 MED ORDER — CARVEDILOL 3.125 MG PO TABS
3.1250 mg | ORAL_TABLET | Freq: Two times a day (BID) | ORAL | Status: DC
Start: 2012-08-05 — End: 2012-08-07
  Administered 2012-08-05 – 2012-08-07 (×5): 3.125 mg via ORAL
  Filled 2012-08-04 (×7): qty 1

## 2012-08-04 MED ORDER — FUROSEMIDE 10 MG/ML IJ SOLN
80.0000 mg | Freq: Once | INTRAMUSCULAR | Status: AC
  Administered 2012-08-04: 80 mg via INTRAVENOUS
  Filled 2012-08-04: qty 8

## 2012-08-04 MED ORDER — ASPIRIN 325 MG PO TABS
325.0000 mg | ORAL_TABLET | Freq: Once | ORAL | Status: AC
  Administered 2012-08-04: 325 mg via ORAL
  Filled 2012-08-04: qty 1

## 2012-08-04 MED ORDER — MORPHINE SULFATE 4 MG/ML IJ SOLN
4.0000 mg | Freq: Once | INTRAMUSCULAR | Status: AC
  Administered 2012-08-04: 4 mg via INTRAVENOUS
  Filled 2012-08-04: qty 1

## 2012-08-04 MED ORDER — INSULIN ASPART 100 UNIT/ML ~~LOC~~ SOLN
0.0000 [IU] | Freq: Three times a day (TID) | SUBCUTANEOUS | Status: DC
  Administered 2012-08-05: 1 [IU] via SUBCUTANEOUS
  Administered 2012-08-06 – 2012-08-07 (×5): 2 [IU] via SUBCUTANEOUS
  Administered 2012-08-08: 1 [IU] via SUBCUTANEOUS
  Administered 2012-08-08: 2 [IU] via SUBCUTANEOUS
  Administered 2012-08-08: 3 [IU] via SUBCUTANEOUS
  Administered 2012-08-09: 2 [IU] via SUBCUTANEOUS

## 2012-08-04 NOTE — ED Provider Notes (Signed)
History    CSN: [id number] Arrival date & time 08/04/12  1402  First MD Initiated Contact with Patient 08/04/12 1525     Chief Complaint  Patient presents with  . Chest Pain   (Consider location/radiation/quality/duration/timing/severity/associated sxs/prior Treatment) HPI Comments: Pt w/ hx of CHF (NICM 2/2 to ETOH - EF 20%), recent d/c from hospital 2 wks ago for NSTEMI - cath w/ occlusion to OM1 - not amenable to stent. On ASA, Plavix. States persistent substernal pressure ever since cath. Denies ETOH or cocaine abuse. + hx of cocaine. Sx a/w worsening DOE - minimal exertion and 6 pillow orthopnea and BLE edema. Denies diaphoresis. Admits to cough above baseline - productive w/ intermittent fever. Also admits to new onset diarrhea and nausea. Pt is concerned for developing pneumonia.  Patient is a 47 y.o. male presenting with general illness. The history is provided by the patient. No language interpreter was used.  Illness Location:  Cardio/pulm Quality:  Dyspnea, chest pain Severity:  Severe Onset quality:  Gradual Duration:  2 weeks Timing:  Constant Progression:  Worsening Chronicity:  Chronic Associated symptoms: chest pain and shortness of breath   Associated symptoms: no abdominal pain, no congestion, no cough, no diarrhea, no fever, no headaches, no nausea, no rash, no sore throat and no vomiting    Past Medical History  Diagnosis Date  . Hypertension   . 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 History of peptic ulcer disease     EGD 09/2007 shows prox gastric ulcer with black eschar, treated with PPI  . Depression   . History of alcohol abuse     quit approx 2011-2012  . Angina   . Shortness of breath   . OSA (obstructive sleep apnea)     sleeps with oxygen  . ICD (implantable cardiac defibrillator) in place   . Diabetes mellitus     insulin dependent  . GERD  (gastroesophageal reflux disease)   . Headache(784.0)   . Myasthenia gravis 10/12/2011    Diagnosed in 09/2011. Received Plasmapheresis. CT chest no Thymoma. Acetylcholine receptor antibody negative.    . CKD (chronic kidney disease) stage 3, GFR 30-59 ml/min   . Myasthenia gravis    Past Surgical History  Procedure Laterality Date  . Cardiac defibrillator placement  04/2007  . Knee surgery    . Skin grafts    . Facial reconstruction surgery    . Insertion of dialysis catheter  10/12/2011    temporary    Family History  Problem Relation Age of Onset  . Heart failure Mother   . Hypertension Mother   . Sarcoidosis Mother   . Diabetes type II Mother   . Heart attack Brother    History  Substance Use Topics  . Smoking status: Former Smoker    Types: Cigars    Quit date: 07/27/2007  . Smokeless tobacco: Former Neurosurgeon  . Alcohol Use: No     Comment: no alcohol since 2012    Review of Systems  Constitutional: Negative for fever and chills.  HENT: Negative for congestion and sore throat.   Respiratory: Positive for shortness of breath. Negative for cough.   Cardiovascular: Positive for chest pain. Negative for leg swelling.  Gastrointestinal: Negative for nausea, vomiting, abdominal pain, diarrhea and constipation.  Genitourinary: Negative for dysuria and frequency.  Skin: Negative for color change and rash.  Neurological: Negative  for dizziness and headaches.  Psychiatric/Behavioral: Negative for confusion and agitation.  All other systems reviewed and are negative.    Allergies  Shellfish allergy; Adhesive; Tylenol; and Zoloft  Home Medications   Current Outpatient Rx  Name  Route  Sig  Dispense  Refill  . carvedilol (COREG) 3.125 MG tablet   Oral   Take 1 tablet (3.125 mg total) by mouth 2 (two) times daily with a meal. For heart problems/HTN         . clopidogrel (PLAVIX) 75 MG tablet   Oral   Take 1 tablet (75 mg total) by mouth daily with breakfast.   30  tablet   11   . clorazepate (TRANXENE) 7.5 MG tablet   Oral   Take 1 tablet (7.5 mg total) by mouth daily. Take three tablets by mouth three times a day (1 in the morning and 2 in the evening): For anxiety   30 tablet   3   . digoxin (LANOXIN) 0.125 MG tablet   Oral   Take 1 tablet (0.125 mg total) by mouth daily. For heart problems         . furosemide (LASIX) 10 MG/ML injection   Intravenous   Inject 8 mLs (80 mg total) into the vein once.   4 mL   0   . hydrOXYzine (ATARAX/VISTARIL) 25 MG tablet   Oral   Take 1-2 tablets (25-50 mg total) by mouth 3 (three) times daily as needed for itching or anxiety. Take 1 or 2 tablets by mouth 3 times daily as needed for itching.(Max of 6 tablets a day): For anxiety/itching   30 tablet   0   . isosorbide mononitrate (IMDUR) 60 MG 24 hr tablet   Oral   Take 1 tablet (60 mg total) by mouth daily. For chronic chest pains         . metolazone (ZAROXOLYN) 2.5 MG tablet   Oral   Take 1 tablet (2.5 mg total) by mouth daily.   3 tablet   0   . omeprazole (PRILOSEC) 20 MG capsule   Oral   Take 1 capsule (20 mg total) by mouth daily. For acid reflux         . potassium chloride (K-DUR) 10 MEQ tablet   Oral   Take 1 tablet (10 mEq total) by mouth daily. For low potassium         . rosuvastatin (CRESTOR) 10 MG tablet   Oral   Take 1 tablet (10 mg total) by mouth daily. For control of high cholesterol         . torsemide (DEMADEX) 20 MG tablet   Oral   Take 3 tablets (60 mg total) by mouth daily. For control of high blood pressure/sewllings.          BP 116/74  Pulse 100  Temp(Src) 100.1 F (37.8 C) (Oral)  Resp 20  SpO2 97% Physical Exam  Constitutional: He is oriented to person, place, and time. He appears well-developed and well-nourished. No distress.  HENT:  Head: Normocephalic and atraumatic.  Eyes: EOM are normal. Pupils are equal, round, and reactive to light.  Neck: Normal range of motion. Neck supple. JVD  present.  Cardiovascular: Normal rate and regular rhythm.   Pulmonary/Chest: Effort normal. No respiratory distress. He has rales.  Abdominal: Soft. He exhibits no distension.  Musculoskeletal: Normal range of motion. He exhibits no edema.       Right lower leg: He exhibits edema.  Left lower leg: He exhibits edema.  Neurological: He is alert and oriented to person, place, and time.  Skin: Skin is warm and dry.  Psychiatric: He has a normal mood and affect. His behavior is normal.    ED Course  Procedures (including critical care time) Results for orders placed during the hospital encounter of 08/04/12  CBC      Result Value Range   WBC 7.7  4.0 - 10.5 K/uL   RBC 5.04  4.22 - 5.81 MIL/uL   Hemoglobin 14.3  13.0 - 17.0 g/dL   HCT 40.9  81.1 - 91.4 %   MCV 84.1  78.0 - 100.0 fL   MCH 28.4  26.0 - 34.0 pg   MCHC 33.7  30.0 - 36.0 g/dL   RDW 78.2 (*) 95.6 - 21.3 %   Platelets 233  150 - 400 K/uL  BASIC METABOLIC PANEL      Result Value Range   Sodium 133 (*) 135 - 145 mEq/L   Potassium 5.0  3.5 - 5.1 mEq/L   Chloride 100  96 - 112 mEq/L   CO2 23  19 - 32 mEq/L   Glucose, Bld 145 (*) 70 - 99 mg/dL   BUN 27 (*) 6 - 23 mg/dL   Creatinine, Ser 0.86 (*) 0.50 - 1.35 mg/dL   Calcium 9.3  8.4 - 57.8 mg/dL   GFR calc non Af Amer 41 (*) >90 mL/min   GFR calc Af Amer 48 (*) >90 mL/min  PRO B NATRIURETIC PEPTIDE      Result Value Range   Pro B Natriuretic peptide (BNP) 5537.0 (*) 0 - 125 pg/mL  TROPONIN I      Result Value Range   Troponin I 0.51 (*) <0.30 ng/mL  APTT      Result Value Range   aPTT 30  24 - 37 seconds  PROTIME-INR      Result Value Range   Prothrombin Time 15.8 (*) 11.6 - 15.2 seconds   INR 1.29  0.00 - 1.49  URINE RAPID DRUG SCREEN (HOSP PERFORMED)      Result Value Range   Opiates NONE DETECTED  NONE DETECTED   Cocaine POSITIVE (*) NONE DETECTED   Benzodiazepines NONE DETECTED  NONE DETECTED   Amphetamines NONE DETECTED  NONE DETECTED    Tetrahydrocannabinol NONE DETECTED  NONE DETECTED   Barbiturates NONE DETECTED  NONE DETECTED  POCT I-STAT TROPONIN I      Result Value Range   Troponin i, poc 0.28 (*) 0.00 - 0.08 ng/mL   Comment NOTIFIED PHYSICIAN     Comment 3             Dg Chest 2 View  08/04/2012   *RADIOLOGY REPORT*  Clinical Data: Chest pain,  shortness of breath and weakness.  CHEST - 2 VIEW  Comparison: 07/15/2012.  Findings: Single lead pacer good position from left-sided approach. Cardiomegaly.  Mild vascular congestion.  Negative osseous structures.  No effusion or pneumothorax.  Improved aeration from priors.  IMPRESSION: Cardiomegaly with mild vascular congestion.  Improved congestive heart failure.   Original Report Authenticated By: Davonna Belling, M.D.   Date: 08/04/2012  Rate: 101  Rhythm: normal sinus rhythm  QRS Axis: indeterminate  Intervals: normal  ST/T Wave abnormalities: TWI V6  Conduction Disutrbances:none  Narrative Interpretation:   Old EKG Reviewed: changes noted new TWI V6   No diagnosis found.  MDM  Exam as above significant for mild volume overload w/ JVD, rales and LE  edema. ECG w/ TWI in V6, initial troponin 0.28, repeat 3 hr troponin 0.51. Concern for NSTEMI, given ASA 325mg , nitro x 3, and placed on heparin gtt. BNP elevated at 5537 - at baseline. Given lasix 80mg  IV. CXR w/ mild pulm vasc congestion, no infiltrate. Afebrile - doubt PNA, no abx given. UDS cocaine +, pt denies use in past 2 wks. Labs otherwise unremarkable. D/w cardiology and internal medicine and pt admitted for likely NSTEMI 2/2 cocaine use. Admit in stable condition.   I have personally reviewed labs and imaging and considered in my MDM. Case d/w Dr Radford Pax  1. NSTEMI (non-ST elevated myocardial infarction)        Audelia Hives, MD 08/05/12 [phone]

## 2012-08-04 NOTE — ED Notes (Signed)
Internal Medicine MDs at bedside.
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
## 2012-08-04 NOTE — ED Notes (Signed)
CRITICAL VALUE ALERT  Critical value received:  Troponin 0.51  Date of notification:  08/04/12  Time of notification:  1655  Critical value read back:yes  Nurse who received alert:  Lurline Idol, RN  MD notified: Dr. Gary Fleet

## 2012-08-04 NOTE — ED Notes (Signed)
Pt reports having mid chest pains, also thinks he has pneumonia due to productive cough, and is having nausea and diarrhea. ekg being done at triage.
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
## 2012-08-04 NOTE — Telephone Encounter (Signed)
Pt called - in ER for past 2 hours for chest pain. Instructed pt to let staff be aware if change in chest pain. Pt needs to stay at ER in he is having chest pain. Stanton Kidney Zareth Rippetoe RN 08/04/12 4PM

## 2012-08-04 NOTE — Progress Notes (Signed)
ANTICOAGULATION CONSULT NOTE - Initial Consult  Pharmacy Consult for Heparin Indication: chest pain/ACS  Allergies  Allergen Reactions  . Shellfish Allergy Anaphylaxis  . Adhesive (Tape) Hives  . Tylenol (Acetaminophen) Nausea Only  . Zoloft (Sertraline Hcl) Other (See Comments)    Talked to people in his sleep May 2013    Patient Measurements: Height: 6' (182.9 cm) Weight: 228 lb (103.42 kg) IBW/kg (Calculated) : 77.6 Heparin Dosing Weight: 100 kg  Vital Signs: Temp: 99.4 F (37.4 C) (07/18 1612) Temp src: Oral (07/18 1612) BP: 116/84 mmHg (07/18 1615) Pulse Rate: 96 (07/18 1615)  Labs:  Recent Labs  08/03/12 1032 08/04/12 1426 08/04/12 1542  HGB  --  14.3  --   HCT  --  42.4  --   PLT  --  233  --   APTT  --   --  30  LABPROT  --   --  15.8*  INR  --   --  1.29  CREATININE 2.07* 1.90*  --   TROPONINI  --   --  0.51*    Estimated Creatinine Clearance: 61.7 ml/min (by C-G formula based on Cr of 1.9).   Medical History: Past Medical History  Diagnosis Date  . Hypertension   . 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 History of peptic ulcer disease     EGD 09/2007 shows prox gastric ulcer with black eschar, treated with PPI  . Depression   . History of alcohol abuse     quit approx 2011-2012  . Angina   . Shortness of breath   . OSA (obstructive sleep apnea)     sleeps with oxygen  . ICD (implantable cardiac defibrillator) in place   . Diabetes mellitus     insulin dependent  . GERD (gastroesophageal reflux disease)   . Headache(784.0)   . Myasthenia gravis 10/12/2011    Diagnosed in 09/2011. Received Plasmapheresis. CT chest no Thymoma. Acetylcholine receptor antibody negative.    . CKD (chronic kidney disease) stage 3, GFR 30-59 ml/min   . Myasthenia gravis     Medications:  See electronic med rec  Assessment: 47 y.o. male presents with chest pain.   Noted recent NSTEMI ~2 wks ago (thought to be due to cocaine).To begin heparin for r/o ACS. CBC stable at baseline. Noted pt here about 2 wks ago with therapeutic heparin levels on 1600 units/hr. INR 1.29 - no anticoagulants PTA.  Goal of Therapy:  Heparin level 0.3-0.7 units/ml Monitor platelets by anticoagulation protocol: Yes   Plan:  1. Heparin IV bolus 4000 units 2. Heparin gtt at 1600 units/hr. 3. Will f/u heparin level in 6 hours 4. Daily heparin level and CBC  Christoper Fabian, PharmD, BCPS Clinical pharmacist, pager [phone] 08/04/2012,5:19 PM

## 2012-08-05 ENCOUNTER — Other Ambulatory Visit: Payer: Self-pay

## 2012-08-05 DIAGNOSIS — N058 Unspecified nephritic syndrome with other morphologic changes: Secondary | ICD-10-CM

## 2012-08-05 DIAGNOSIS — E1129 Type 2 diabetes mellitus with other diabetic kidney complication: Secondary | ICD-10-CM

## 2012-08-05 DIAGNOSIS — I10 Essential (primary) hypertension: Secondary | ICD-10-CM

## 2012-08-05 DIAGNOSIS — N179 Acute kidney failure, unspecified: Secondary | ICD-10-CM

## 2012-08-05 LAB — BASIC METABOLIC PANEL
BUN: 36 mg/dL — ABNORMAL HIGH (ref 6–23)
Calcium: 8.9 mg/dL (ref 8.4–10.5)
Creatinine, Ser: 2.15 mg/dL — ABNORMAL HIGH (ref 0.50–1.35)
GFR calc Af Amer: 43 mL/min — ABNORMAL LOW (ref >90)
GFR calc Af Amer: 41 mL/min — ABNORMAL LOW (ref >90)
GFR calc non Af Amer: 37 mL/min — ABNORMAL LOW (ref >90)
GFR calc non Af Amer: 36 mL/min — ABNORMAL LOW (ref >90)
Glucose, Bld: 158 mg/dL — ABNORMAL HIGH (ref 70–99)
Potassium: 3.9 mEq/L (ref 3.5–5.1)
Sodium: 133 mEq/L — ABNORMAL LOW (ref 135–145)

## 2012-08-05 LAB — DIGOXIN LEVEL: Digoxin Level: 0.4 ng/mL — ABNORMAL LOW (ref 0.8–2.0)

## 2012-08-05 LAB — TROPONIN I: Troponin I: 0.82 ng/mL (ref <0.30)

## 2012-08-05 LAB — GLUCOSE, CAPILLARY: Glucose-Capillary: 158 mg/dL — ABNORMAL HIGH (ref 70–99)

## 2012-08-05 LAB — MRSA PCR SCREENING: MRSA by PCR: NEGATIVE

## 2012-08-05 LAB — CBC
MCH: 28.6 pg (ref 26.0–34.0)
MCHC: 33.8 g/dL (ref 30.0–36.0)
MCV: 84.6 fL (ref 78.0–100.0)
Platelets: 219 10*3/uL (ref 150–400)
RDW: 16.1 % — ABNORMAL HIGH (ref 11.5–15.5)
WBC: 6.8 10*3/uL (ref 4.0–10.5)

## 2012-08-05 MED ORDER — FUROSEMIDE 10 MG/ML IJ SOLN
10.0000 mg/h | INTRAVENOUS | Status: DC
  Administered 2012-08-05 – 2012-08-06 (×2): 10 mg/h via INTRAVENOUS
  Filled 2012-08-05 (×3): qty 25

## 2012-08-05 MED ORDER — MORPHINE SULFATE 2 MG/ML IJ SOLN
2.0000 mg | INTRAMUSCULAR | Status: DC | PRN
  Administered 2012-08-05 – 2012-08-06 (×7): 2 mg via INTRAVENOUS
  Filled 2012-08-05 (×7): qty 1

## 2012-08-05 MED ORDER — HEPARIN SODIUM (PORCINE) 5000 UNIT/ML IJ SOLN
5000.0000 [IU] | Freq: Three times a day (TID) | INTRAMUSCULAR | Status: DC
  Administered 2012-08-05 – 2012-08-08 (×11): 5000 [IU] via SUBCUTANEOUS
  Filled 2012-08-05 (×15): qty 1

## 2012-08-05 MED ORDER — MILRINONE IN DEXTROSE 20 MG/100ML IV SOLN
0.1250 ug/kg/min | INTRAVENOUS | Status: DC
  Administered 2012-08-05 – 2012-08-07 (×4): 0.25 ug/kg/min via INTRAVENOUS
  Administered 2012-08-07 (×2): 0.125 ug/kg/min via INTRAVENOUS
  Filled 2012-08-05 (×4): qty 100

## 2012-08-05 MED ORDER — POTASSIUM CHLORIDE CRYS ER 20 MEQ PO TBCR
40.0000 meq | EXTENDED_RELEASE_TABLET | Freq: Once | ORAL | Status: AC
  Administered 2012-08-05: 40 meq via ORAL
  Filled 2012-08-05: qty 2

## 2012-08-05 MED ORDER — LORAZEPAM 2 MG/ML IJ SOLN
0.5000 mg | Freq: Every day | INTRAMUSCULAR | Status: DC
  Administered 2012-08-05 – 2012-08-06 (×2): 0.5 mg via INTRAVENOUS
  Filled 2012-08-05 (×2): qty 1

## 2012-08-05 MED ORDER — FUROSEMIDE 10 MG/ML IJ SOLN
40.0000 mg | Freq: Once | INTRAMUSCULAR | Status: AC
  Administered 2012-08-05: 40 mg via INTRAVENOUS

## 2012-08-05 NOTE — ED Provider Notes (Signed)
I saw and evaluated the patient, reviewed the resident's note and I agree with the findings and plan.   .Face to face Exam:  General:  Awake HEENT:  Atraumatic Resp:  Normal effort Abd:  Nondistended Neuro:No focal weakness   Nelia Shi, MD 08/05/12 1514

## 2012-08-05 NOTE — Progress Notes (Signed)
ANTICOAGULATION CONSULT NOTE - Follow Up Consult  Pharmacy Consult for heparin Indication: NSTEMI  Labs:  Recent Labs  08/03/12 1032 08/04/12 1426 08/04/12 1542 08/04/12 2345  HGB  --  14.3  --   --   HCT  --  42.4  --   --   PLT  --  233  --   --   APTT  --   --  30  --   LABPROT  --   --  15.8*  --   INR  --   --  1.29  --   HEPARINUNFRC  --   --   --  0.38  CREATININE 2.07* 1.90*  --   --   TROPONINI  --   --  0.51* 0.82*    Assessment/Plan:  47yo male therapeutic on heparin with initial dosing for NSTEMI thought to be d/t cocaine abuse.  Will continue gtt at current rate and confirm stable with am labs.  Vernard Gambles, PharmD, BCPS  08/05/2012,12:30 AM

## 2012-08-05 NOTE — Progress Notes (Signed)
ANTICOAGULATION CONSULT NOTE - Follow Up Consult  Pharmacy Consult for heparin Indication: chest pain/ACS  Allergies  Allergen Reactions  . Shellfish Allergy Anaphylaxis  . Adhesive (Tape) Hives  . Tylenol (Acetaminophen) Nausea Only  . Zoloft (Sertraline Hcl) Other (See Comments)    Talked to people in his sleep May 2013    Patient Measurements: Height: 6" (15.2 cm) Weight: 218 lb 7.6 oz (99.1 kg) IBW/kg (Calculated) : -74.2 Heparin Dosing Weight: 93g  Vital Signs: Temp: 98.4 F (36.9 C) (07/19 0418) Temp src: Oral (07/19 0418) BP: 109/75 mmHg (07/19 0700) Pulse Rate: 87 (07/19 0700)  Labs:  Recent Labs  08/03/12 1032 08/04/12 1426 08/04/12 1542 08/04/12 2345 08/05/12 0500  HGB  --  14.3  --   --  14.5  HCT  --  42.4  --   --  42.9  PLT  --  233  --   --  219  APTT  --   --  30  --   --   LABPROT  --   --  15.8*  --   --   INR  --   --  1.29  --   --   HEPARINUNFRC  --   --   --  0.38 0.34  CREATININE 2.07* 1.90*  --   --  2.09*  TROPONINI  --   --  0.51* 0.82* 0.92*    Estimated Creatinine Clearance: -3.1 ml/min (by C-G formula based on Cr of 2.09).   Medications:  Scheduled:  . aspirin EC  81 mg Oral Daily  . atorvastatin  80 mg Oral q1800  . carvedilol  3.125 mg Oral BID WC  . clopidogrel  75 mg Oral Q breakfast  . digoxin  0.125 mg Oral Daily  . furosemide  80 mg Intravenous Q8H  . insulin aspart  0-9 Units Subcutaneous TID WC  . isosorbide mononitrate  60 mg Oral Daily  . pantoprazole  40 mg Oral Daily   Infusions:  . heparin 1,600 Units/hr (08/05/12 8119)    Assessment: 47 yo male on heparin for concern of ACS with positive troponins. Patient had a recent NSTEMI and noted with cocaine abuse and CKD.  Heparin level is at goal (HL= 0.34) on 1600 units/hr.  Goal of Therapy:  Heparin level 0.3-0.7 units/ml Monitor platelets by anticoagulation protocol: Yes   Plan:  -No heparin changes needed -Daily heparin level and CBC  [name],  Pharm D 08/05/2012 7:57 AM

## 2012-08-05 NOTE — H&P (Signed)
INTERNAL MEDICINE TEACHING SERVICE Attending Admission Note  Date: 08/05/2012  Patient name: [name]  Medical record number: [id number]  Date of birth: 01-Dec-1965    I have seen and evaluated [name] and discussed their care with the Residency Team.  46 yr. Old AAM w/ ISCM EF 10%, hx CAD s/p NSTEMI, CKD 3, HTN, OSA, cocaine abuse, presented with CP and SOB.  He was complaining of PND and increased orthopnea and noted to be 20 lbs over his discharge weight.  He admits he recently ran out of his medications due to affordability problems.  He initially denied use of cocaine, but then was noted to have a +UDS for cocaine.   He is noted is have an initial + Trop I that is now uptrending.  He is currently CP free but still has SOB.  The concern here is demand ischemia and uncompensated HFrEF.  I agree with heparin gtt and would use BZD to treat cocaine induced vasospasm.  It would also be reasonable to use a NTG gtt as well.  His Cr is noted to uptrend since admission, and this could represent some cardiorenal physiology.  Careful with use of BB in acute decompensated HF.  Consult cardiology and continue IV diuresis 80 mg tid. Follow I/O's and follow K, Mg carefully as low levels can favor Kirby Funk, etcJonah Blue, DO 7/19/201411:53 AM

## 2012-08-05 NOTE — Progress Notes (Addendum)
Patient ID: [name], male   DOB: [birth year], 47 y.o.   MRN: [id number]    SUBJECTIVE: Patient is short of breath and orthopneic.  He says he has had the chest pain chronically for 3-4 weeks now.  He is diaphoretic.   Marland Kitchen aspirin EC  81 mg Oral Daily  . atorvastatin  80 mg Oral q1800  . carvedilol  3.125 mg Oral BID WC  . clopidogrel  75 mg Oral Q breakfast  . digoxin  0.125 mg Oral Daily  . furosemide  40 mg Intravenous Once  . insulin aspart  0-9 Units Subcutaneous TID WC  . isosorbide mononitrate  60 mg Oral Daily  . LORazepam  0.5 mg Intravenous Daily  . pantoprazole  40 mg Oral Daily  . potassium chloride  40 mEq Oral Once      Filed Vitals:   08/05/12 0600 08/05/12 0626 08/05/12 0700 08/05/12 0800  BP: 109/75 109/75 109/75   Pulse: 88 94 87   Temp:    98.7 F (37.1 C)  TempSrc:      Resp: 22  25   Height:      Weight:      SpO2: 96%  100%     Intake/Output Summary (Last 24 hours) at 08/05/12 1211 Last data filed at 08/05/12 1000  Gross per 24 hour  Intake    504 ml  Output   2800 ml  Net  -2296 ml    LABS: Basic Metabolic Panel:  Recent Labs  72/53/66 1426 08/05/12 0500  NA 133* 133*  K 5.0 3.9  CL 100 98  CO2 23 24  GLUCOSE 145* 128*  BUN 27* 31*  CREATININE 1.90* 2.09*  CALCIUM 9.3 8.9   Liver Function Tests: No results found for this basename: AST, ALT, ALKPHOS, BILITOT, PROT, ALBUMIN,  in the last 72 hours No results found for this basename: LIPASE, AMYLASE,  in the last 72 hours CBC:  Recent Labs  08/04/12 1426 08/05/12 0500  WBC 7.7 6.8  HGB 14.3 14.5  HCT 42.4 42.9  MCV 84.1 84.6  PLT 233 219   Cardiac Enzymes:  Recent Labs  08/04/12 1542 08/04/12 2345 08/05/12 0500  TROPONINI 0.51* 0.82* 0.92*   BNP: No components found with this basename: POCBNP,  D-Dimer: No results found for this basename: DDIMER,  in the last 72 hours Hemoglobin A1C: No results found for this basename: HGBA1C,  in the last 72 hours Fasting  Lipid Panel: No results found for this basename: CHOL, HDL, LDLCALC, TRIG, CHOLHDL, LDLDIRECT,  in the last 72 hours Thyroid Function Tests: No results found for this basename: TSH, T4TOTAL, FREET3, T3FREE, THYROIDAB,  in the last 72 hours Anemia Panel: No results found for this basename: VITAMINB12, FOLATE, FERRITIN, TIBC, IRON, RETICCTPCT,  in the last 72 hours  RADIOLOGY: Dg Chest 2 View  08/04/2012   *RADIOLOGY REPORT*  Clinical Data: Chest pain,  shortness of breath and weakness.  CHEST - 2 VIEW  Comparison: 07/15/2012.  Findings: Single lead pacer good position from left-sided approach. Cardiomegaly.  Mild vascular congestion.  Negative osseous structures.  No effusion or pneumothorax.  Improved aeration from priors.  IMPRESSION: Cardiomegaly with mild vascular congestion.  Improved congestive heart failure.   Original Report Authenticated By: Davonna Belling, M.D.   Dg Chest Portable 1 View  07/15/2012   *RADIOLOGY REPORT*  Clinical Data: Chest pain.  PORTABLE CHEST - 1 VIEW  Comparison: 06/16/2012.  Findings: The heart is mildly enlarged.  There  is a right ventricular ICD pacer wire with left-sided generator.  The mediastinal and hilar contours are normal.  There is mild, generalized diffuse interstitial thickening.  No alveolar disease. No pneumothoraces or evident effusions.  IMPRESSION: Cardiomegaly and interstitial thickening consistent with congestive heart failure.   Original Report Authenticated By: Sander Radon, M.D.    PHYSICAL EXAM General: uncomfortable (short of breath) and diaphoretic.  Neck: JVP 16 cm, no thyromegaly or thyroid nodule.  Lungs: Crackles at bases.  CV: Lateral PMI.  Heart regular S1/S2, no S3/S4, no murmur.  No peripheral edema.   Abdomen: Soft, nontender, no hepatosplenomegaly, no distention.  Neurologic: Alert and oriented x 3.  Psych: Normal affect. Extremities: No clubbing or cyanosis.  Cool.   TELEMETRY: Reviewed telemetry pt in NSR  ASSESSMENT AND  PLAN: 47 yo with history of nonischemic CMP, cocaine abuse, recent NSTEMI, and CKD with poor medication compliance returned to hospital with acute on chronic systolic CHF and chest pain.  1. Acute on chronic systolic CHF: Nonischemic cardiomyopathy.  SBP in 100s, cool extremities, diaphoretic, very volume overloaded.  I think that he is in a low output state.  Creatinine 2.09.  I think that he is going to need inotropic support for ideal diuresis.  - Start milrinone 0.25 mcg/kg/min - Lasix 40 mg IV bolus followed by Lasix gtt at 10 mg/hr.  - Continue other low dose Coreg and digoxin, digoxin level ok.  - PICC for coox and CVPs 2. CAD: Thrombotic occlusion of distal OM1 noted on cath after recent presentation with NSTEMI (too small caliber for intervention). He was UDS + for cocaine at that time. Suspect he may have had a cocaine-triggered plaque rupture. There was no thrombus in the LV cavity on echo.  TnI was > 20 at that time, now TnI in 0.51-0.92 range.  This may be residual elevation in the face of CKD versus TnI elevation due to demand ischemia with volume overload.  Of note, he was cocaine positive again.  He will need to continue ASA, Plavix, and statin.  Can stop heparin gtt today.   3. CKD: Stable creatinine.  Suspect component of cardiorenal syndrome.  4. Poor long-term prognosis.  He is homeless, not particularly compliant with meds, and uses cocaine.   [name] 08/05/2012 12:19 PM

## 2012-08-05 NOTE — Progress Notes (Addendum)
Subjective: The patient was admitted yesterday for NSTEMI and CHF exacerbation.  Overnight, he diuresed 2 L, and his weight decreased from 228 to 218 (though scale variation likely accounts for some of this).  He notes less SOB this morning.  He still notes concern about left arm pain from an IV his last admission.  The patient's creatinine has mildly increased.  Objective: Vital signs in last 24 hours: Filed Vitals:   08/05/12 0600 08/05/12 0626 08/05/12 0700 08/05/12 0800  BP: 109/75 109/75 109/75   Pulse: 88 94 87   Temp:    98.7 F (37.1 C)  TempSrc:      Resp: 22  25   Height:      Weight:      SpO2: 96%  100%    Weight change:   Intake/Output Summary (Last 24 hours) at 08/05/12 1157 Last data filed at 08/05/12 1000  Gross per 24 hour  Intake    504 ml  Output   2800 ml  Net  -2296 ml   General: lying in bed, appears comfortable HEENT: pupils equal round and reactive to light, vision grossly intact, oropharynx clear and non-erythematous  Neck: supple, no lymphadenopathy Lungs: crackles at bilateral bases, normal work of respiration  Heart: regular rate and rhythm, no murmurs, gallops, or rubs Abdomen: soft, non-tender, non-distended, normal bowel sounds Extremities: left forearm with palpable cord, bilateral LE edema Neurologic: alert & oriented X3, cranial nerves II-XII intact, strength grossly intact, sensation intact to light touch  Lab Results: Basic Metabolic Panel:  Recent Labs Lab 08/04/12 1426 08/05/12 0500  NA 133* 133*  K 5.0 3.9  CL 100 98  CO2 23 24  GLUCOSE 145* 128*  BUN 27* 31*  CREATININE 1.90* 2.09*  CALCIUM 9.3 8.9   CBC:  Recent Labs Lab 08/04/12 1426 08/05/12 0500  WBC 7.7 6.8  HGB 14.3 14.5  HCT 42.4 42.9  MCV 84.1 84.6  PLT 233 219   Cardiac Enzymes:  Recent Labs Lab 08/04/12 1542 08/04/12 2345 08/05/12 0500  TROPONINI 0.51* 0.82* 0.92*   BNP:  Recent Labs Lab 08/04/12 1426  PROBNP 5537.0*   CBG:  Recent  Labs Lab 08/02/12 0920  GLUCAP 301*   Coagulation:  Recent Labs Lab 08/04/12 1542  LABPROT 15.8*  INR 1.29   Urine Drug Screen: Drugs of Abuse     Component Value Date/Time   LABOPIA NONE DETECTED 08/04/2012 1742   LABOPIA NEG 02/01/2012 1356   COCAINSCRNUR POSITIVE* 08/04/2012 1742   COCAINSCRNUR PPS 02/01/2012 1356   LABBENZ NONE DETECTED 08/04/2012 1742   LABBENZ PPS 02/01/2012 1356   LABBENZ NEG 07/09/2010 1522   AMPHETMU NONE DETECTED 08/04/2012 1742   AMPHETMU NEG 07/09/2010 1522   THCU NONE DETECTED 08/04/2012 1742   LABBARB NONE DETECTED 08/04/2012 1742   LABBARB NEG 02/01/2012 1356      Micro Results: Recent Results (from the past 240 hour(s))  MRSA PCR SCREENING     Status: None   Collection Time    08/04/12 10:32 PM      Result Value Range Status   MRSA by PCR NEGATIVE  NEGATIVE Final   Comment:            The GeneXpert MRSA Assay (FDA     approved for NASAL specimens     only), is one component of a     comprehensive MRSA colonization     surveillance program. It is not     intended to diagnose MRSA  infection nor to guide or     monitor treatment for     MRSA infections.   Studies/Results: Dg Chest 2 View  08/04/2012   *RADIOLOGY REPORT*  Clinical Data: Chest pain,  shortness of breath and weakness.  CHEST - 2 VIEW  Comparison: 07/15/2012.  Findings: Single lead pacer good position from left-sided approach. Cardiomegaly.  Mild vascular congestion.  Negative osseous structures.  No effusion or pneumothorax.  Improved aeration from priors.  IMPRESSION: Cardiomegaly with mild vascular congestion.  Improved congestive heart failure.   Original Report Authenticated By: Davonna Belling, M.D.   Medications: I have reviewed the patient's current medications. Scheduled Meds: . aspirin EC  81 mg Oral Daily  . atorvastatin  80 mg Oral q1800  . carvedilol  3.125 mg Oral BID WC  . clopidogrel  75 mg Oral Q breakfast  . digoxin  0.125 mg Oral Daily  . furosemide  80 mg  Intravenous Q8H  . insulin aspart  0-9 Units Subcutaneous TID WC  . isosorbide mononitrate  60 mg Oral Daily  . LORazepam  0.5 mg Intravenous Daily  . pantoprazole  40 mg Oral Daily   Continuous Infusions: . heparin 1,600 Units/hr (08/05/12 0722)   PRN Meds:.acetaminophen, hydrOXYzine, morphine injection, nitroGLYCERIN, ondansetron (ZOFRAN) IV  Assessment/Plan: Mr. Terpstra is a 47 y/o man with history of systolic heart failure (EF 10% on 07/18/12), CAD s/p NSTEMI 2 weeks ago, CKD stage III, HTN, HL, OSA, obesity, cocaine abuse admitted for CHF exacerbation, NSTEMI.   # NSTEMI - Pt presents with chest pain, SOB, and elevated troponin, with UDS+ for cocaine, and weight up 22 lbs since last admission. NSTEMI likely represents cocaine-induced vasospasm vs demand ischemia (2/2 acute CHF exacerbation) vs NSTEMI type 1 (history of CAD, subtotal thrombosis of distal OM1 seen on cath 6/30). -cardiology consulted, appreciate recs -stop heparin today -continue aspirin and plavix given recent NSTEMI -atorvastatin  -coreg  -continue imdur, digoxin  -treat CHF per below   # Acute on Chronic CHF - Pt has a history of CHF. Last echo 07/18/12 showed EF 10% with diffuse hypokinesis. The patient notes orthopnea, PND, and LE edema, with a 22-lb weight gain (weight on last admission 206), and a pro-BNP of 5537 (elevated from 4881 last admit). The patient's torsemide was recently increased to 80 mg, with Metolazone 2.5 mg every M&F added 7/17 at the patient's CHF clinic visit. The patient has an ICD in place.  -given increase in creatinine, change lasix to a drip at 10 mg/hr, and add milrinone -continue coreg -follow creatinine  # CKD stage 3 - Cr still around patient's baseline, mildly increased since yesterday which may represent some cardiorenal syndrome -renally dose medications  -adding milrinone for inotropic support today  # Cocaine abuse - active, given UDS positive for cocaine this admission    -strongly encouraged cessation   # DM2 - A1C = 7.9 last admission .  -SSI sensitive   # HTN - chronic, stable  -continue coreg, imdur, lasix   #OSA - CPAP  Dispo: Disposition is deferred at this time, awaiting improvement of current medical problems.  Anticipated discharge in approximately 2-3 day(s).   The patient does have a current PCP Darden Palmer, MD) and does not need an [facility] hospital follow-up appointment after discharge.  .Services Needed at time of discharge: Y = Yes, Blank = No PT:   OT:   RN:   Equipment:   Other:     LOS: 1 day   Alycia Rossetti  Lennox Pippins, MD 08/05/2012, 11:57 AM

## 2012-08-06 ENCOUNTER — Inpatient Hospital Stay (HOSPITAL_COMMUNITY): Payer: Medicare Other

## 2012-08-06 LAB — BASIC METABOLIC PANEL
BUN: 38 mg/dL — ABNORMAL HIGH (ref 6–23)
CO2: 26 mEq/L (ref 19–32)
Calcium: 9 mg/dL (ref 8.4–10.5)
Chloride: 95 mEq/L — ABNORMAL LOW (ref 96–112)
Creatinine, Ser: 2.23 mg/dL — ABNORMAL HIGH (ref 0.50–1.35)
GFR calc Af Amer: 39 mL/min — ABNORMAL LOW (ref >90)

## 2012-08-06 LAB — CBC
HCT: 42.7 % (ref 39.0–52.0)
MCHC: 33.5 g/dL (ref 30.0–36.0)
MCV: 82.6 fL (ref 78.0–100.0)
Platelets: 197 10*3/uL (ref 150–400)
RDW: 15.1 % (ref 11.5–15.5)
WBC: 5.8 10*3/uL (ref 4.0–10.5)

## 2012-08-06 LAB — GLUCOSE, CAPILLARY: Glucose-Capillary: 153 mg/dL — ABNORMAL HIGH (ref 70–99)

## 2012-08-06 MED ORDER — MORPHINE SULFATE 2 MG/ML IJ SOLN
2.0000 mg | INTRAMUSCULAR | Status: DC | PRN
  Administered 2012-08-06 – 2012-08-09 (×10): 2 mg via INTRAVENOUS
  Filled 2012-08-06 (×12): qty 1

## 2012-08-06 MED ORDER — POTASSIUM CHLORIDE CRYS ER 20 MEQ PO TBCR: 40.0000 meq | EXTENDED_RELEASE_TABLET | Freq: Once | ORAL | Status: DC

## 2012-08-06 MED ORDER — SODIUM CHLORIDE 0.9 % IJ SOLN: 10.0000 mL | INTRAMUSCULAR | Status: DC | PRN

## 2012-08-06 MED ORDER — POTASSIUM CHLORIDE CRYS ER 20 MEQ PO TBCR
40.0000 meq | EXTENDED_RELEASE_TABLET | Freq: Once | ORAL | Status: AC
  Administered 2012-08-06: 40 meq via ORAL
  Filled 2012-08-06: qty 2

## 2012-08-06 MED ORDER — SODIUM CHLORIDE 0.9 % IJ SOLN
10.0000 mL | Freq: Two times a day (BID) | INTRAMUSCULAR | Status: DC
  Administered 2012-08-06: 10 mL
  Administered 2012-08-07: 30 mL
  Administered 2012-08-07: 10 mL
  Administered 2012-08-08: 20 mL

## 2012-08-06 MED ORDER — MIRTAZAPINE 30 MG PO TABS
30.0000 mg | ORAL_TABLET | Freq: Every day | ORAL | Status: DC
  Administered 2012-08-06 – 2012-08-08 (×3): 30 mg via ORAL
  Filled 2012-08-06 (×4): qty 1

## 2012-08-06 NOTE — Progress Notes (Signed)
Pharmacist Heart Failure Core Measure Documentation  Assessment: [name] has an EF documented as 10% on 07/18/12 by ECHO.  Rationale: Heart failure patients with left ventricular systolic dysfunction (LVSD) and an EF < 40% should be prescribed an angiotensin converting enzyme inhibitor (ACEI) or angiotensin receptor blocker (ARB) at discharge unless a contraindication is documented in the medical record.  This patient is not currently on an ACEI or ARB for HF.  This note is being placed in the record in order to provide documentation that a contraindication to the use of these agents is present for this encounter.  ACE Inhibitor or Angiotensin Receptor Blocker is contraindicated (specify all that apply)  []   ACEI allergy AND ARB allergy []   Angioedema []   Moderate or severe aortic stenosis []   Hyperkalemia []   Hypotension []   Renal artery stenosis [x]   Worsening renal function, preexisting renal disease or dysfunction   [name], Pharm D 08/06/2012 10:54 AM

## 2012-08-06 NOTE — Progress Notes (Addendum)
Patient ID: [name], male   DOB: 10/11/1965, 47 y.o.   MRN: [id number]    SUBJECTIVE: Patient is very sedated this morning.  He received IV Ativan.  Prior to that, per nurse he was doing ok.  He diuresed well yesterday, weight is down 7 lbs.    Marland Kitchen aspirin EC  81 mg Oral Daily  . atorvastatin  80 mg Oral q1800  . carvedilol  3.125 mg Oral BID WC  . clopidogrel  75 mg Oral Q breakfast  . digoxin  0.125 mg Oral Daily  . heparin subcutaneous  5,000 Units Subcutaneous Q8H  . insulin aspart  0-9 Units Subcutaneous TID WC  . isosorbide mononitrate  60 mg Oral Daily  . mirtazapine  30 mg Oral QHS  . pantoprazole  40 mg Oral Daily  . potassium chloride  40 mEq Oral Once  milrinone gtt 0.25 Lasix gtt 10 mg/hr    Filed Vitals:   08/06/12 0029 08/06/12 0500 08/06/12 0904 08/06/12 1018  BP: 108/75 115/66 102/60   Pulse: 76 73 91 89  Temp: 98 F (36.7 C) 97.7 F (36.5 C)    TempSrc: Oral Oral    Resp: 20 25 19    Height:      Weight:  96 kg (211 lb 10.3 oz)    SpO2: 99% 99% 100%     Intake/Output Summary (Last 24 hours) at 08/06/12 1108 Last data filed at 08/06/12 0700  Gross per 24 hour  Intake 503.47 ml  Output   5350 ml  Net -4846.53 ml    LABS: Basic Metabolic Panel:  Recent Labs  95/28/41 1912 08/06/12 0556  NA 132* 132*  K 4.6 3.9  CL 92* 95*  CO2 30 26  GLUCOSE 158* 134*  BUN 36* 38*  CREATININE 2.15* 2.23*  CALCIUM 8.9 9.0  MG  --  2.1   Liver Function Tests: No results found for this basename: AST, ALT, ALKPHOS, BILITOT, PROT, ALBUMIN,  in the last 72 hours No results found for this basename: LIPASE, AMYLASE,  in the last 72 hours CBC:  Recent Labs  08/05/12 0500 08/06/12 0556  WBC 6.8 5.8  HGB 14.5 14.3  HCT 42.9 42.7  MCV 84.6 82.6  PLT 219 197   Cardiac Enzymes:  Recent Labs  08/04/12 1542 08/04/12 2345 08/05/12 0500  TROPONINI 0.51* 0.82* 0.92*   BNP: No components found with this basename: POCBNP,  D-Dimer: No results found  for this basename: DDIMER,  in the last 72 hours Hemoglobin A1C: No results found for this basename: HGBA1C,  in the last 72 hours Fasting Lipid Panel: No results found for this basename: CHOL, HDL, LDLCALC, TRIG, CHOLHDL, LDLDIRECT,  in the last 72 hours Thyroid Function Tests: No results found for this basename: TSH, T4TOTAL, FREET3, T3FREE, THYROIDAB,  in the last 72 hours Anemia Panel: No results found for this basename: VITAMINB12, FOLATE, FERRITIN, TIBC, IRON, RETICCTPCT,  in the last 72 hours  RADIOLOGY: Dg Chest 2 View  08/04/2012   *RADIOLOGY REPORT*  Clinical Data: Chest pain,  shortness of breath and weakness.  CHEST - 2 VIEW  Comparison: 07/15/2012.  Findings: Single lead pacer good position from left-sided approach. Cardiomegaly.  Mild vascular congestion.  Negative osseous structures.  No effusion or pneumothorax.  Improved aeration from priors.  IMPRESSION: Cardiomegaly with mild vascular congestion.  Improved congestive heart failure.   Original Report Authenticated By: Davonna Belling, M.D.   Dg Chest Portable 1 View  07/15/2012   *RADIOLOGY  REPORT*  Clinical Data: Chest pain.  PORTABLE CHEST - 1 VIEW  Comparison: 06/16/2012.  Findings: The heart is mildly enlarged.  There is a right ventricular ICD pacer wire with left-sided generator.  The mediastinal and hilar contours are normal.  There is mild, generalized diffuse interstitial thickening.  No alveolar disease. No pneumothoraces or evident effusions.  IMPRESSION: Cardiomegaly and interstitial thickening consistent with congestive heart failure.   Original Report Authenticated By: Sander Radon, M.D.    PHYSICAL EXAM General: uncomfortable (short of breath) and diaphoretic.  Neck: JVP 12-14 cm, no thyromegaly or thyroid nodule.  Lungs: Crackles at bases.  CV: Lateral PMI.  Heart regular S1/S2, no S3/S4, no murmur.  No peripheral edema.   Abdomen: Soft, nontender, no hepatosplenomegaly, no distention.  Neurologic: Alert and  oriented x 3.  Psych: Normal affect. Extremities: No clubbing or cyanosis.  Cool.   TELEMETRY: Reviewed telemetry pt in NSR with 1 run of 8 beats NSVT  ASSESSMENT AND PLAN: 47 yo with history of nonischemic CMP, cocaine abuse, recent NSTEMI, and CKD with poor medication compliance returned to hospital with acute on chronic systolic CHF and chest pain.  1. Acute on chronic systolic CHF: Nonischemic cardiomyopathy with low output state on admission.  He is doing well on combination of milrinone and Lasix gtt, weight is down 5 lbs.  Creatinine stable.  - Continue milrinone and Lasix gtt, patient is still volume overloaded.  - Continue low dose Coreg and digoxin, digoxin level ok.  - PICC for coox and CVPs 2. CAD: Thrombotic occlusion of distal OM1 noted on cath after recent presentation with NSTEMI (too small caliber for intervention). He was UDS + for cocaine at that time. Suspect he may have had a cocaine-triggered plaque rupture. There was no thrombus in the LV cavity on echo.  TnI was > 20 at that time, now TnI in 0.51-0.92 range.  This may be residual elevation in the face of CKD versus TnI elevation due to demand ischemia with volume overload.  Of note, he was cocaine positive again.  He will need to continue ASA, Plavix, and statin.   3. CKD: Stable creatinine.  Suspect component of cardiorenal syndrome.  4. NSVT: Replete K, check Mg, monitor.  He has ICD.  5. Neuro: Sedated this morning.  Just got standing IV Ativan.  Not sure what the indication is.  He is oversedated so will stop it.  6. Poor long-term prognosis.  He is homeless, not particularly compliant with meds, and uses cocaine.   [name] 08/06/2012 11:08 AM

## 2012-08-06 NOTE — Progress Notes (Signed)
Peripherally Inserted Central Catheter/Midline Placement  The IV Nurse has discussed with the patient and/or persons authorized to consent for the patient, the purpose of this procedure and the potential benefits and risks involved with this procedure.  The benefits include less needle sticks, lab draws from the catheter and patient may be discharged home with the catheter.  Risks include, but not limited to, infection, bleeding, blood clot (thrombus formation), and puncture of an artery; nerve damage and irregular heat beat.  Alternatives to this procedure were also discussed.  PICC/Midline Placement Documentation  PICC Triple Lumen 08/06/12 PICC Right Basilic (Active)       Ethelda Chick 08/06/2012, 5:48 PM

## 2012-08-06 NOTE — Progress Notes (Signed)
Teaching Service, Dr. Darci Needle, notified pt had self limiting, 6-Beat run of V-Tach; pt asymptomatic.  RN continuing to monitor.

## 2012-08-06 NOTE — Progress Notes (Signed)
Subjective: Patient still endorses SOB worse with exertion, but this is much improved from time of admission. He has had chronic constant chest pressure for a few weeks now and this is has continued this admission, but improves with morphine; does not have CP currently. He slept much better last night, but requests Remeron for sleep aid since he takes this at home. Per patient this is prescribed by psychiatry. He diuresed approx 4L over the past 24 hours, he is down 16lbs since admission on 7/18.  Objective: Vital signs in last 24 hours: Filed Vitals:   08/05/12 2347 08/06/12 0029 08/06/12 0500 08/06/12 0904  BP:  108/75 115/66 102/60  Pulse: 79 76 73 91  Temp:  98 F (36.7 C) 97.7 F (36.5 C)   TempSrc:  Oral Oral   Resp: 18 20 25 19   Height:      Weight:   211 lb 10.3 oz (96 kg)   SpO2: 100% 99% 99% 100%   Weight change: -16 lb 5.7 oz (-7.42 kg)  Intake/Output Summary (Last 24 hours) at 08/06/12 0943 Last data filed at 08/06/12 0700  Gross per 24 hour  Intake 535.47 ml  Output   5350 ml  Net -4814.53 ml   General: lying in bed, appears comfortable HEENT: pupils equal round and reactive to light, vision grossly intact, oropharynx clear and non-erythematous  Neck: supple, no lymphadenopathy Lungs: CTAB, normal work of respiration  Heart: regular rate and rhythm, no murmurs, gallops, or rubs Abdomen: soft, non-tender, non-distended, normal bowel sounds Extremities: left forearm with palpable cord, bilateral 1+ LE edema Neurologic: alert & oriented X3, cranial nerves II-XII grossly intact, strength grossly intact, sensation intact to light touch  Lab Results: Basic Metabolic Panel:  Recent Labs Lab 08/05/12 1912 08/06/12 0556  NA 132* 132*  K 4.6 3.9  CL 92* 95*  CO2 30 26  GLUCOSE 158* 134*  BUN 36* 38*  CREATININE 2.15* 2.23*  CALCIUM 8.9 9.0  MG  --  2.1   CBC:  Recent Labs Lab 08/05/12 0500 08/06/12 0556  WBC 6.8 5.8  HGB 14.5 14.3  HCT 42.9 42.7    MCV 84.6 82.6  PLT 219 197   Cardiac Enzymes:  Recent Labs Lab 08/04/12 1542 08/04/12 2345 08/05/12 0500  TROPONINI 0.51* 0.82* 0.92*   BNP:  Recent Labs Lab 08/04/12 1426  PROBNP 5537.0*   CBG:  Recent Labs Lab 08/02/12 0920 08/05/12 0726 08/05/12 1131 08/05/12 2105 08/06/12 0818  GLUCAP 301* 115* 158* 207* 162*   Coagulation:  Recent Labs Lab 08/04/12 1542  LABPROT 15.8*  INR 1.29   Urine Drug Screen: Drugs of Abuse     Component Value Date/Time   LABOPIA NONE DETECTED 08/04/2012 1742   LABOPIA NEG 02/01/2012 1356   COCAINSCRNUR POSITIVE* 08/04/2012 1742   COCAINSCRNUR PPS 02/01/2012 1356   LABBENZ NONE DETECTED 08/04/2012 1742   LABBENZ PPS 02/01/2012 1356   LABBENZ NEG 07/09/2010 1522   AMPHETMU NONE DETECTED 08/04/2012 1742   AMPHETMU NEG 07/09/2010 1522   THCU NONE DETECTED 08/04/2012 1742   LABBARB NONE DETECTED 08/04/2012 1742   LABBARB NEG 02/01/2012 1356     Studies/Results: Dg Chest 2 View  08/04/2012   *RADIOLOGY REPORT*  Clinical Data: Chest pain,  shortness of breath and weakness.  CHEST - 2 VIEW  Comparison: 07/15/2012.  Findings: Single lead pacer good position from left-sided approach. Cardiomegaly.  Mild vascular congestion.  Negative osseous structures.  No effusion or pneumothorax.  Improved aeration from  priors.  IMPRESSION: Cardiomegaly with mild vascular congestion.  Improved congestive heart failure.   Original Report Authenticated By: Davonna Belling, M.D.   Medications: I have reviewed the patient's current medications. Scheduled Meds: . aspirin EC  81 mg Oral Daily  . atorvastatin  80 mg Oral q1800  . carvedilol  3.125 mg Oral BID WC  . clopidogrel  75 mg Oral Q breakfast  . digoxin  0.125 mg Oral Daily  . heparin subcutaneous  5,000 Units Subcutaneous Q8H  . insulin aspart  0-9 Units Subcutaneous TID WC  . isosorbide mononitrate  60 mg Oral Daily  . LORazepam  0.5 mg Intravenous Daily  . pantoprazole  40 mg Oral Daily   Continuous  Infusions: . furosemide (LASIX) infusion 10 mg/hr (08/05/12 1900)  . milrinone 0.25 mcg/kg/min (08/06/12 0130)   PRN Meds:.acetaminophen, hydrOXYzine, morphine injection, nitroGLYCERIN, ondansetron (ZOFRAN) IV  Assessment/Plan: Mr. 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   # NSTEMI - Pt presents with chest pain, SOB, and elevated troponin, with UDS+ for cocaine, and weight up 22 lbs since last admission. NSTEMI likely represents cocaine-induced vasospasm vs demand ischemia (2/2 acute CHF exacerbation) vs NSTEMI type 1 (history of CAD, subtotal thrombosis of distal OM1 seen on cath 6/30). -cardiology consulted, appreciate recs -stopped heparin yesterday per cardiology -continue aspirin and plavix given recent NSTEMI -atorvastatin  -coreg  -continue imdur, digoxin  -treat CHF per below   # Acute on chronic systolic CHF - Pt has a history of CHF. Last echo 07/18/12 showed EF 10% with diffuse hypokinesis. The patient notes orthopnea, PND, and LE edema, with a 22-lb weight gain (weight on last admission 206), and a pro-BNP of 5537 (elevated from 4881 last admit). The patient's torsemide was recently increased to 80 mg, with Metolazone 2.5 mg every M&F added 7/17 at the patient's CHF clinic visit. The patient has an ICD in place.  -continue lasix drip at 10 mg/hr and milrinone gtt at 7.91mL/hr -continue coreg -follow creatinine- trending up since admission- likely 2/2 diuresis and/or cardiorenal syndrome, which is why we started the milrinone yesterday -replete lytes as needed - goal is Mg>2 and K>4 given his extensive heart disease  # CKD stage 3 - Cr trending up despite addition of milrinone yesterday. May be continued cardiorenal syndrome vs. Influence of diuresis on his kidneys. -continue milrinone for  inotropic support  # Cocaine abuse - UDS positive for cocaine this admission  -strongly encouraged cessation   # DM2 - A1C = 7.9 last admission -SSI sensitive   # HTN - chronic, stable  -continue coreg, imdur, lasix   # OSA - CPAP  # VTE- heparin  #Diet- heart healthy  Dispo: Disposition is deferred at this time, awaiting improvement of current medical problems.  Anticipated discharge in approximately 2 day(s).   The patient does have a current PCP Darden Palmer, MD) and does not need an [facility] hospital follow-up appointment after discharge.  .Services Needed at time of discharge: Y = Yes, Blank = No PT:   OT:   RN:   Equipment:   Other:     LOS: 2 days   Windell Hummingbird, MD 08/06/2012, 9:43 AM

## 2012-08-06 NOTE — Progress Notes (Signed)
Teaching Service, Dr. Darci Needle notified pt had self limiting, 8 Beat run of V-Tach; pt was asymptomatic.  RN continuing to monitor.

## 2012-08-07 DIAGNOSIS — F1410 Cocaine abuse, uncomplicated: Secondary | ICD-10-CM

## 2012-08-07 DIAGNOSIS — I5021 Acute systolic (congestive) heart failure: Secondary | ICD-10-CM

## 2012-08-07 DIAGNOSIS — Z515 Encounter for palliative care: Secondary | ICD-10-CM

## 2012-08-07 DIAGNOSIS — R531 Weakness: Secondary | ICD-10-CM

## 2012-08-07 DIAGNOSIS — R5381 Other malaise: Secondary | ICD-10-CM

## 2012-08-07 DIAGNOSIS — I214 Non-ST elevation (NSTEMI) myocardial infarction: Secondary | ICD-10-CM

## 2012-08-07 LAB — BASIC METABOLIC PANEL
BUN: 40 mg/dL — ABNORMAL HIGH (ref 6–23)
Chloride: 95 mEq/L — ABNORMAL LOW (ref 96–112)
GFR calc Af Amer: 42 mL/min — ABNORMAL LOW (ref >90)
Glucose, Bld: 137 mg/dL — ABNORMAL HIGH (ref 70–99)
Potassium: 4 mEq/L (ref 3.5–5.1)

## 2012-08-07 LAB — GLUCOSE, CAPILLARY
Glucose-Capillary: 116 mg/dL — ABNORMAL HIGH (ref 70–99)
Glucose-Capillary: 157 mg/dL — ABNORMAL HIGH (ref 70–99)

## 2012-08-07 LAB — CARBOXYHEMOGLOBIN
Carboxyhemoglobin: 1.4 % (ref 0.5–1.5)
O2 Saturation: 60.2 %

## 2012-08-07 MED ORDER — METOLAZONE 2.5 MG PO TABS
2.5000 mg | ORAL_TABLET | ORAL | Status: DC
  Administered 2012-08-09: 2.5 mg via ORAL
  Filled 2012-08-07: qty 1

## 2012-08-07 MED ORDER — TORSEMIDE 20 MG PO TABS
80.0000 mg | ORAL_TABLET | Freq: Every day | ORAL | Status: DC
  Administered 2012-08-08 – 2012-08-09 (×2): 80 mg via ORAL
  Filled 2012-08-07 (×2): qty 4

## 2012-08-07 MED ORDER — HYDRALAZINE HCL 10 MG PO TABS
10.0000 mg | ORAL_TABLET | Freq: Three times a day (TID) | ORAL | Status: DC
  Administered 2012-08-07 – 2012-08-08 (×4): 10 mg via ORAL
  Filled 2012-08-07 (×7): qty 1

## 2012-08-07 NOTE — Progress Notes (Signed)
Internal Medicine Attending  Date: 08/07/2012  Patient name: [name] Medical record number: [id number] Date of birth: 27-Oct-1965 Age: 47 y.o. Gender: male  I saw and evaluated the patient. I reviewed the resident's note by Dr. Darci Needle and I agree with the resident's findings and plans as documented in her progress note.  Appreciate Cardiology's recommendations, which we will follow.  Will continue the milrinone drip but convert back to his home oral diuretic regimen.  Diuresis has been excellent as he is now > 20 pounds down since admission.

## 2012-08-07 NOTE — Progress Notes (Signed)
Subjective: Patient slept well overnight after receiving remeron, no acute events. He c/o CP only when he lies flat, otherwise he has no chest pain. He denies SOB, abd pain, N/V. He had two runs of asymptomatic V tach yesterday (6 and 8 beats respectively).  Continues to diurese well, approx 3 liters over past 24 hours and down 23lbs since admission.  Objective: Vital signs in last 24 hours: Filed Vitals:   08/06/12 2349 08/07/12 0400 08/07/12 0651 08/07/12 0700  BP: 117/77 109/72 111/73   Pulse: 84 78 79   Temp: 97.7 F (36.5 C) 98.4 F (36.9 C)    TempSrc: Oral Oral    Resp: 30 20    Height:      Weight:  211 lb 13.8 oz (96.1 kg)  205 lb 8 oz (93.214 kg)  SpO2: 100% 99%     Weight change: 3.5 oz (0.1 kg)  Intake/Output Summary (Last 24 hours) at 08/07/12 0747 Last data filed at 08/07/12 0600  Gross per 24 hour  Intake 315.81 ml  Output   3275 ml  Net -2959.19 ml   General: sleeping in bed, appears very comfortable HEENT: pupils equal round and reactive to light, vision grossly intact, oropharynx clear and non-erythematous  Neck: supple Lungs: CTAB, normal work of respiration  Heart: regular rate and rhythm, no murmurs, gallops, or rubs Abdomen: soft, non-tender, non-distended, normal bowel sounds Extremities: left forearm with palpable cord, no erythema; trace edema to BLE Neurologic: alert & oriented X3, cranial nerves II-XII grossly intact, strength grossly intact, sensation intact to light touch  Lab Results: Basic Metabolic Panel:  Recent Labs Lab 08/05/12 1912 08/06/12 0556 08/06/12 1252  NA 132* 132*  --   K 4.6 3.9  --   CL 92* 95*  --   CO2 30 26  --   GLUCOSE 158* 134*  --   BUN 36* 38*  --   CREATININE 2.15* 2.23*  --   CALCIUM 8.9 9.0  --   MG  --  2.1 2.0   CBC:  Recent Labs Lab 08/05/12 0500 08/06/12 0556  WBC 6.8 5.8  HGB 14.5 14.3  HCT 42.9 42.7  MCV 84.6 82.6  PLT 219 197   Cardiac Enzymes:  Recent Labs Lab 08/04/12 1542  08/04/12 2345 08/05/12 0500  TROPONINI 0.51* 0.82* 0.92*   BNP:  Recent Labs Lab 08/04/12 1426  PROBNP 5537.0*   CBG:  Recent Labs Lab 08/05/12 1131 08/05/12 2105 08/06/12 0818 08/06/12 1152 08/06/12 1811 08/06/12 2140  GLUCAP 158* 207* 162* 156* 200* 153*   Coagulation:  Recent Labs Lab 08/04/12 1542  LABPROT 15.8*  INR 1.29   Urine Drug Screen: Drugs of Abuse     Component Value Date/Time   LABOPIA NONE DETECTED 08/04/2012 1742   LABOPIA NEG 02/01/2012 1356   COCAINSCRNUR POSITIVE* 08/04/2012 1742   COCAINSCRNUR PPS 02/01/2012 1356   LABBENZ NONE DETECTED 08/04/2012 1742   LABBENZ PPS 02/01/2012 1356   LABBENZ NEG 07/09/2010 1522   AMPHETMU NONE DETECTED 08/04/2012 1742   AMPHETMU NEG 07/09/2010 1522   THCU NONE DETECTED 08/04/2012 1742   LABBARB NONE DETECTED 08/04/2012 1742   LABBARB NEG 02/01/2012 1356     Studies/Results: Dg Chest Port 1 View  08/06/2012   *RADIOLOGY REPORT*  Clinical Data: PICC placement.  PORTABLE CHEST - 1 VIEW  Comparison: Single view of the chest 08/04/2012.  Findings: New right PICC is in place with the tip in the mid superior vena cava.  There  is cardiomegaly but no edema.  Lungs are clear.  AICD noted.  IMPRESSION:  1.  Tip of right PICC projects over the mid superior vena cava. 2.  Cardiomegaly without acute disease.   Original Report Authenticated By: Holley Dexter, M.D.   Medications: I have reviewed the patient's current medications. Scheduled Meds: . aspirin EC  81 mg Oral Daily  . atorvastatin  80 mg Oral q1800  . clopidogrel  75 mg Oral Q breakfast  . digoxin  0.125 mg Oral Daily  . heparin subcutaneous  5,000 Units Subcutaneous Q8H  . insulin aspart  0-9 Units Subcutaneous TID WC  . isosorbide mononitrate  60 mg Oral Daily  . mirtazapine  30 mg Oral QHS  . pantoprazole  40 mg Oral Daily  . sodium chloride  10-40 mL Intracatheter Q12H   Continuous Infusions: . milrinone 0.25 mcg/kg/min (08/07/12 0333)   PRN  Meds:.acetaminophen, hydrOXYzine, morphine injection, nitroGLYCERIN, ondansetron (ZOFRAN) IV, sodium chloride  Assessment/Plan: Mr. 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   # NSTEMI - Pt was admitted with chest pain, SOB, and elevated troponin, with UDS+ for cocaine, and weight up 22 lbs since last admission. NSTEMI likely represents cocaine-induced vasospasm vs demand ischemia (2/2 acute CHF exacerbation) vs NSTEMI type 1 (history of CAD, subtotal thrombosis of distal OM1 seen on cath 6/30). -cardiology consulted, appreciate recs -discontinued heparin drip per cardiology -continue aspirin and plavix given recent NSTEMI -atorvastatin  -coreg  -continue imdur, digoxin  -treat CHF per below   # Acute on chronic systolic CHF - Pt has a history of severe systolic CHF, last echo 7/1/14EF 10% with diffuse hypokinesis. The patient notes orthopnea, PND, and LE edema, with a 22-lb weight gain (weight on last admission 206), and a pro-BNP of 5537 (elevated from 4881 last admit). The patient's torsemide was recently increased to 80 mg, with Metolazone 2.5 mg every M&F added 7/17 at the patient's CHF clinic visit. The patient has an ICD in place.  -per cardiology, will stop lasix drip today and restart home regimen of torsemide 80mg  daily wi/ metolazone 2.5mg  twice weekly -continue milrinone at 3.65mL/hr for now - per cardiology, will add hydralazine 10mg  tid -continue coreg -follow creatinine- trending up since admission- likely 2/2 diuresis and/or cardiorenal syndrome- BMet for this AM is pending -replete lytes as needed - goal is Mg>2 and K>4 given his extensive heart disease  # CKD stage 3 - Cr trending up, but BMet today is still pending. May be cardiorenal syndrome vs. influence of diuresis on his  kidneys. -continue milrinone for inotropic support  # Cocaine abuse - UDS positive for cocaine this admission  -strongly encouraged cessation   # DM2 - A1C = 7.9 last admission -SSI sensitive   # HTN - chronic, stable  -continue coreg, imdur, torsemide, metolazone  # OSA - CPAP  # VTE- heparin  #Diet- heart healthy  Dispo: Disposition is deferred at this time, awaiting improvement of current medical problems.  Anticipated discharge in approximately 1-2 day(s).   The patient does have a current PCP Darden Palmer, MD) and does not need an [facility] hospital follow-up appointment after discharge.  .Services Needed at time of discharge: Y = Yes, Blank = No PT:   OT:   RN:   Equipment:   Other:  LOS: 3 days   Windell Hummingbird, MD 08/07/2012, 7:47 AM

## 2012-08-07 NOTE — Consult Note (Signed)
[name]      DOB: 02-10-1965      UUV:[id number]     Consult Note from the Palliative Medicine Team at [facility]    Consult Requested by:  Dr Kem Kays     PCP: Darden Palmer, MD Reason for Consultation:Clarification of GOC and options     Phone Number:[phone]  Assessment of patients Current state:[name] is a 47 y/o man with history of systolic heart failure (EF 10% on 07/18/12), CAD s/p NSTEMI at last admission (d/c 7/2), CKD stage III, HTN, HL, OSA, obesity, cocaine abuse presenting with chest pain, shortness of breath. With ICD.    Complex pscho-social history.  Reports living in a shelter with his three children and help from family.  Overall long term prognosis is poor, has had hospice benefit in the past, but he is presently hoping for "more time" to be with his children, " I can' just give up"   Consult is for review of medical treatment options, clarification of goals of care and end of life issues, disposition and options, and symptom recommendation.  This NP Lorinda Creed reviewed medical records, received report from team, assessed the Jonathan and then meet at the Jonathan's bedside  to discuss diagnosis prognosis, GOC, EOL wishes disposition and options.  Jonathan adamently states he does not want his wife involved in any of his care or decisions and not to be called.  He gives me the name and number of his sister in [place] # [phone]  I strongly encouraged the Jonathan to secure a HPOA of his choice and that I could help him with that if he so desired  A detailed discussion was had today regarding advanced directives.  Concepts specific to code status, artifical feeding and hydration, continued IV antibiotics and rehospitalization was had.  The difference between a aggressive medical intervention path  and a palliative comfort care path for this Jonathan at this time was had.  Values and goals of care important to Jonathan and family were  attempted to be elicited.  Concept of Hospice and Palliative Care were discussed  Natural trajectory and expectations at EOL were discussed.  Questions and concerns addressed Family encouraged to call with questions or concerns.  PMT will continue to support holistically.   Goals of Care: 1.  Code Status    Partial: no intubation   2. Scope of Treatment: 1.  Continue with all available medical interventions to prolong life at this time.  He also reports that "if hospice can help, yes I need them", "they helped me in the past"  4. Disposition:  Wrote for social work consult to assist.  Jonathan is open to return to shelter,  He tell me the name of the shelter is [facility]" on [address].    3. Symptom Management:   1. Weakness/Fatigue:  encouraged medical compliance  4. Psychosocial:  Jonathan initially was angry and irritable.  He was then able to express his reaction as a response to his fear of "anybody messing with my kids".  Complex situation     Brief HPI: [name] is a 47 y/o man with history of systolic heart failure (EF 10% on 07/18/12), CAD s/p NSTEMI at last admission (d/c 7/2), CKD stage III, HTN, HL, OSA, obesity, cocaine abuse presenting with chest pain, shortness of breath.  Complex pscho-social history.  Reports living in a shelter with his three children and help from family.  Overall long term prognosis is  poor, has had hospice benefit in the past, but he is presently hoping for "more time" to be with his children, " I can' just give up"    ROS:  Weakness, fatigue, SOB on minimal exertion    PMH:  Past Medical History  Diagnosis Date  . Hypertension   . CHF (congestive heart failure)     a. 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 b. EF 20-25%, s/p [employer] AICD placement 04/2007. c. Echo 06/2012: EF 10%.  . History of peptic ulcer disease     EGD 09/2007 shows prox gastric ulcer with black eschar, treated with PPI  . Depression   .  History of alcohol abuse     quit approx 2011-2012  . OSA (obstructive sleep apnea)     Intolerant of CPAP  . ICD (implantable cardiac defibrillator) in place   . Diabetes mellitus     insulin dependent  . GERD (gastroesophageal reflux disease)   . Headache(784.0)   . Myasthenia gravis 10/12/2011    Diagnosed in 09/2011. Received Plasmapheresis. CT chest no Thymoma. Acetylcholine receptor antibody negative.    . CKD (chronic kidney disease) stage 3, GFR 30-59 ml/min   . Polysubstance abuse     Including cocaine (+ UDS multiple times in 2014)  . PUD (peptic ulcer disease)   . Pulmonary nodule     a. CT 09/2011.  Marland Kitchen CAD (coronary artery disease)     a. NSTEMI 06/2012: cath showed subtotal thrombotic occlusion of the distal OM1, too small for PCI, ?cocaine-triggered plaque rupture with thrombosis (no LV thrombus on f/u echo).      PSH: Past Surgical History  Procedure Laterality Date  . Cardiac defibrillator placement  04/2007  . Knee surgery    . Skin grafts    . Facial reconstruction surgery    . Insertion of dialysis catheter  10/12/2011    temporary    I have reviewed the FH and SH and  If appropriate update it with new information. Allergies  Allergen Reactions  . Shellfish Allergy Anaphylaxis  . Adhesive (Tape) Hives  . Tylenol (Acetaminophen) Nausea Only  . Zoloft (Sertraline Hcl) Other (See Comments)    Talked to people in his sleep May 2013   Scheduled Meds: . aspirin EC  81 mg Oral Daily  . atorvastatin  80 mg Oral q1800  . clopidogrel  75 mg Oral Q breakfast  . digoxin  0.125 mg Oral Daily  . heparin subcutaneous  5,000 Units Subcutaneous Q8H  . hydrALAZINE  10 mg Oral Q8H  . insulin aspart  0-9 Units Subcutaneous TID WC  . isosorbide mononitrate  60 mg Oral Daily  . [START ON 08/09/2012] metolazone  2.5 mg Oral Q Wed,Sat  . mirtazapine  30 mg Oral QHS  . pantoprazole  40 mg Oral Daily  . sodium chloride  10-40 mL Intracatheter Q12H  . [START ON 08/08/2012]  torsemide  80 mg Oral Daily   Continuous Infusions: . milrinone 0.125 mcg/kg/min (08/07/12 0842)   PRN Meds:.acetaminophen, hydrOXYzine, morphine injection, nitroGLYCERIN, ondansetron (ZOFRAN) IV, sodium chloride    BP 115/78  Pulse 79  Temp(Src) 98.6 F (37 C) (Oral)  Resp 20  Ht 6" (0.152 m)  Wt 93.214 kg (205 lb 8 oz)  BMI 4,034.54 kg/m2  SpO2 99%   PPS: 50%   Intake/Output Summary (Last 24 hours) at 08/07/12 1409 Last data filed at 08/07/12 1300  Gross per 24 hour  Intake 814.31 ml  Output  3775 ml  Net -2960.69 ml    Physical Exam:  General: sitting up in bed with lunch tray. NAD HEENT:  Mm, no exudate Chest:   RRR CVS: CTA Abdomen:soft NT +BS Ext: trace edema BLE Neuro: initially angry and irritable, eventually calmed down and apologized  Labs: CBC    Component Value Date/Time   WBC 5.8 08/06/2012 0556   RBC 5.17 08/06/2012 0556   HGB 14.3 08/06/2012 0556   HCT 42.7 08/06/2012 0556   PLT 197 08/06/2012 0556   MCV 82.6 08/06/2012 0556   MCH 27.7 08/06/2012 0556   MCHC 33.5 08/06/2012 0556   RDW 15.1 08/06/2012 0556   LYMPHSABS 0.8 07/15/2012 0925   MONOABS 0.3 07/15/2012 0925   EOSABS 0.7 07/15/2012 0925   BASOSABS 0.1 07/15/2012 0925    BMET    Component Value Date/Time   NA 132* 08/07/2012 0500   K 4.0 08/07/2012 0500   CL 95* 08/07/2012 0500   CO2 28 08/07/2012 0500   GLUCOSE 137* 08/07/2012 0500   BUN 40* 08/07/2012 0500   CREATININE 2.12* 08/07/2012 0500   CREATININE 1.75* 07/03/2012 1018   CALCIUM 9.1 08/07/2012 0500   GFRNONAA 36* 08/07/2012 0500   GFRAA 42* 08/07/2012 0500    CMP     Component Value Date/Time   NA 132* 08/07/2012 0500   K 4.0 08/07/2012 0500   CL 95* 08/07/2012 0500   CO2 28 08/07/2012 0500   GLUCOSE 137* 08/07/2012 0500   BUN 40* 08/07/2012 0500   CREATININE 2.12* 08/07/2012 0500   CREATININE 1.75* 07/03/2012 1018   CALCIUM 9.1 08/07/2012 0500   PROT 6.9 07/15/2012 0925   ALBUMIN 3.1* 07/15/2012 0925   AST 20 07/15/2012 0925   ALT  12 07/15/2012 0925   ALKPHOS 80 07/15/2012 0925   BILITOT 1.4* 07/15/2012 0925   GFRNONAA 36* 08/07/2012 0500   GFRAA 42* 08/07/2012 0500      Time In Time Out Total Time Spent with Jonathan Total Overall Time  1330 1430 50 min 60 min    Greater than 50%  of this time was spent counseling and coordinating care related to the above assessment and plan.  Lorinda Creed NP  Palliative Medicine Team Team Phone # [phone] Pager [phone]  Discussed above with Windell Hummingbird MD resident

## 2012-08-07 NOTE — Progress Notes (Signed)
Thank you for consulting the Palliative Medicine Team at [facility] to meet your patient's and family's needs.   The reason that you asked [place] to see your patient is for hospice care consult  We have scheduled your patient for a meeting: today, Monday 7/21 @ 1-2 pm  The Surrogate decision maker is: none - per patient he does not want his wife involved in this discussion and wants PMT provider to speak only with him  Other family members that need to be present: none  Your patient is able/unable to participate: able to participate  Additional Narrative:  Patient chart reviewed patient was [facility] discharged from services for improved condition in November 2013; per notes patient's has a complex psycho-social history; currently homeless; per patient who was very lethargic during this visit had to wait and continue to re-engage patient in discussion as he kept eyes closed most of discussion; patient stated, he does not want to involved his wife who is listed as contact on the face-sheet- support outside of hospital is unclear   PMT provider will see patient later today as noted above   Valente David, RN 08/07/2012, 11:52 AM Palliative Medicine Team RN Liaison [phone]

## 2012-08-07 NOTE — Progress Notes (Signed)
Patient ID: [name], male   DOB: 10-27-1965, 47 y.o.   MRN: [id number]    SUBJECTIVE:   Placed on Milrinone at 0.25 mcg and lasix drip at 10 mg per hour. Overall his weight is down 17 pounds. At baseline 205-208 pounds. +UDS cocaine.   Denies SOB/PND/Orthopnea.   BMET pending. CVP 3 . aspirin EC  81 mg Oral Daily  . atorvastatin  80 mg Oral q1800  . clopidogrel  75 mg Oral Q breakfast  . digoxin  0.125 mg Oral Daily  . heparin subcutaneous  5,000 Units Subcutaneous Q8H  . insulin aspart  0-9 Units Subcutaneous TID WC  . isosorbide mononitrate  60 mg Oral Daily  . mirtazapine  30 mg Oral QHS  . pantoprazole  40 mg Oral Daily  . sodium chloride  10-40 mL Intracatheter Q12H  milrinone gtt 0.25 Lasix gtt 10 mg/hr    Filed Vitals:   08/06/12 2349 08/07/12 0400 08/07/12 0651 08/07/12 0700  BP: 117/77 109/72 111/73   Pulse: 84 78 79   Temp: 97.7 F (36.5 C) 98.4 F (36.9 C)    TempSrc: Oral Oral    Resp: 30 20    Height:      Weight:  96.1 kg (211 lb 13.8 oz)  93.214 kg (205 lb 8 oz)  SpO2: 100% 99%      Intake/Output Summary (Last 24 hours) at 08/07/12 0834 Last data filed at 08/07/12 0600  Gross per 24 hour  Intake 315.81 ml  Output   3275 ml  Net -2959.19 ml    LABS: Basic Metabolic Panel:  Recent Labs  40/98/11 1912 08/06/12 0556 08/06/12 1252  NA 132* 132*  --   K 4.6 3.9  --   CL 92* 95*  --   CO2 30 26  --   GLUCOSE 158* 134*  --   BUN 36* 38*  --   CREATININE 2.15* 2.23*  --   CALCIUM 8.9 9.0  --   MG  --  2.1 2.0   Liver Function Tests: No results found for this basename: AST, ALT, ALKPHOS, BILITOT, PROT, ALBUMIN,  in the last 72 hours No results found for this basename: LIPASE, AMYLASE,  in the last 72 hours CBC:  Recent Labs  08/05/12 0500 08/06/12 0556  WBC 6.8 5.8  HGB 14.5 14.3  HCT 42.9 42.7  MCV 84.6 82.6  PLT 219 197   Cardiac Enzymes:  Recent Labs  08/04/12 1542 08/04/12 2345 08/05/12 0500  TROPONINI 0.51* 0.82*  0.92*   BNP: No components found with this basename: POCBNP,  D-Dimer: No results found for this basename: DDIMER,  in the last 72 hours Hemoglobin A1C: No results found for this basename: HGBA1C,  in the last 72 hours Fasting Lipid Panel: No results found for this basename: CHOL, HDL, LDLCALC, TRIG, CHOLHDL, LDLDIRECT,  in the last 72 hours Thyroid Function Tests: No results found for this basename: TSH, T4TOTAL, FREET3, T3FREE, THYROIDAB,  in the last 72 hours Anemia Panel: No results found for this basename: VITAMINB12, FOLATE, FERRITIN, TIBC, IRON, RETICCTPCT,  in the last 72 hours  RADIOLOGY: Dg Chest 2 View  08/04/2012   *RADIOLOGY REPORT*  Clinical Data: Chest pain,  shortness of breath and weakness.  CHEST - 2 VIEW  Comparison: 07/15/2012.  Findings: Single lead pacer good position from left-sided approach. Cardiomegaly.  Mild vascular congestion.  Negative osseous structures.  No effusion or pneumothorax.  Improved aeration from priors.  IMPRESSION: Cardiomegaly with mild  vascular congestion.  Improved congestive heart failure.   Original Report Authenticated By: Davonna Belling, M.D.   Dg Chest Portable 1 View  07/15/2012   *RADIOLOGY REPORT*  Clinical Data: Chest pain.  PORTABLE CHEST - 1 VIEW  Comparison: 06/16/2012.  Findings: The heart is mildly enlarged.  There is a right ventricular ICD pacer wire with left-sided generator.  The mediastinal and hilar contours are normal.  There is mild, generalized diffuse interstitial thickening.  No alveolar disease. No pneumothoraces or evident effusions.  IMPRESSION: Cardiomegaly and interstitial thickening consistent with congestive heart failure.   Original Report Authenticated By: Sander Radon, M.D.    PHYSICAL EXAM - CVP 3 personally checked General: NAD resting in bed  Neck: JVP flat no thyromegaly or thyroid nodule.  Lungs: Clear  CV: Lateral PMI.  Heart regular S1/S2, no S3/S4, no murmur.  No peripheral edema.   Abdomen: Soft,  nontender, no hepatosplenomegaly, no distention.  Neurologic: Alert and oriented x 3.  Psych: Normal affect. Extremities: No clubbing or cyanosis.  Cool.   TELEMETRY: Reviewed telemetry pt in NSR with 2 runs of 4 beats NSVT  ASSESSMENT AND PLAN: 47 yo with history of nonischemic CMP, cocaine abuse, recent NSTEMI, and CKD with poor medication compliance returned to hospital with acute on chronic systolic CHF and chest pain.  1. Acute on chronic systolic CHF: Nonischemic cardiomyopathy with low output state on admission.  CVP down to 3. Weight down 23 pounds. At his baseline (205-208 pounds). Stop lasix drip. Switch to torsemide 80 mg daily tomorrow with metolazone twice a week. BMET pending.  - Continue milrinone. Stop Lasix gtt. Hopefully to can start to wean. Home Milrinone is not going to be an option as he is homeless and a current drug user.   - Continue low dose digoxin, digoxin level ok. No beta blocker due to ongoing cocaine use. + UDS this admit.  - Start hydralazine 10 mg tid.  - PICC for coox and CVPs. CVP 3 CO-OX 60 2. CAD: Thrombotic occlusion of distal OM1 noted on cath after recent presentation with NSTEMI (too small caliber for intervention). He was UDS + for cocaine at that time. Suspect he may have had a cocaine-triggered plaque rupture. There was no thrombus in the LV cavity on echo.  TnI was > 20 at that time, now TnI in 0.51-0.92 range.  This may be residual elevation in the face of CKD versus TnI elevation due to demand ischemia with volume overload.  Of note, he was cocaine positive again.  He will need to continue ASA, Plavix, and statin.   3. JYN:[id number] baseline 1.6-1.8.  BMET pending. Stable creatinine.  Suspect component of cardiorenal syndrome.  4. NSVT: BMET and Mg pending. He has ICD.  5. Noncompliance 6. Poor long-term prognosis.  He is homeless, not particularly compliant with meds, and uses cocaine. May need to refer back to Hospice and at that time would need to  turn off ICD.   [name] 08/07/2012 8:36 AM

## 2012-08-08 LAB — BASIC METABOLIC PANEL
BUN: 37 mg/dL — ABNORMAL HIGH (ref 6–23)
Chloride: 98 mEq/L (ref 96–112)
GFR calc Af Amer: 49 mL/min — ABNORMAL LOW (ref >90)
GFR calc non Af Amer: 42 mL/min — ABNORMAL LOW (ref >90)
Glucose, Bld: 142 mg/dL — ABNORMAL HIGH (ref 70–99)
Potassium: 4.4 mEq/L (ref 3.5–5.1)
Sodium: 134 mEq/L — ABNORMAL LOW (ref 135–145)

## 2012-08-08 LAB — CARBOXYHEMOGLOBIN
Carboxyhemoglobin: 1.6 % — ABNORMAL HIGH (ref 0.5–1.5)
O2 Saturation: 98.5 %
Total hemoglobin: 15.1 g/dL (ref 13.5–18.0)

## 2012-08-08 LAB — GLUCOSE, CAPILLARY

## 2012-08-08 MED ORDER — HYDRALAZINE HCL 25 MG PO TABS
25.0000 mg | ORAL_TABLET | Freq: Three times a day (TID) | ORAL | Status: DC
  Administered 2012-08-08 – 2012-08-09 (×4): 25 mg via ORAL
  Filled 2012-08-08 (×6): qty 1

## 2012-08-08 NOTE — Progress Notes (Signed)
Internal Medicine Attending  Date: 08/08/2012  Patient name: [name] Medical record number: [id number] Date of birth: 1965-10-15 Age: 47 y.o. Gender: male  I saw and evaluated the patient. I reviewed the resident's note by Dr. Darci Needle and I agree with the resident's findings and plans as documented in her progress note.  [name] is approaching his dry weight.  Appreciate Cardiology's recommendations.  Milrinone drip has been discontinued, he remains on his outpatient oral diuretic regimen, and his hydralazine dose has been increased.  Palliative Care Team is walking [name] through his thinking on Palliative/Hospice Care.  Social Work has also been involved in his care.  I agree with the housestaff's plan to transfer to a telemetry bed.  If he remains stable medically on his oral regimen I anticipate he will be ready for discharge in the very near future.

## 2012-08-08 NOTE — Progress Notes (Signed)
Pt was sitting up on side of bed when I arrived. Pt seemed sad and I addressed that and pt said he has a lot going on. 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  He said he "would be all right."  Pt accepted prayer - he said he always accepts prayer. Pt seemed appreciative for prayer and visit. Marjory Lies Chaplain  08/08/12 1700  Clinical Encounter Type  Visited With Patient

## 2012-08-08 NOTE — Progress Notes (Signed)
Subjective: Patient slept well last night, but is complaining of constant central chest pain, the same pain he has been having for the past few weeks. The pain is worse with lying flat and with palpation. He denies SOB, abd pain, N/V. He had a 17 beat run of asymptomatic vtach this morning. Continues to diurese well, approx 1.5 liters over past 24 hours and was down 23lbs since admission yesterday, no new weight recorded yet today. Per cardiology, will stop milrinone today and increase dose of hydralazine. Patient has numerous socioeconomic stressors and will be seen by CSW today. Palliative team is following.  Objective: Vital signs in last 24 hours: Filed Vitals:   08/07/12 2358 08/08/12 0003 08/08/12 0040 08/08/12 0400  BP:      Pulse: 86 85 93 80  Temp:   98 F (36.7 C)   TempSrc:   Oral   Resp: 23 20 17 22   Height:      Weight:      SpO2: 96% 98% 99% 92%   Weight change:   Intake/Output Summary (Last 24 hours) at 08/08/12 0816 Last data filed at 08/08/12 0600  Gross per 24 hour  Intake  681.4 ml  Output   2250 ml  Net -1568.6 ml   General: sleeping in bed, appears very comfortable HEENT: pupils equal round and reactive to light, vision grossly intact, oropharynx clear and non-erythematous  Neck: supple Lungs: CTAB, normal work of respiration  Heart: central chest is tender to palpation; regular rate and rhythm, no murmurs, gallops, or rubs; no JVD Abdomen: soft, non-tender, non-distended, normal bowel sounds Extremities: left forearm with palpable cord, no erythema; trace edema to BLE Neurologic: alert & oriented X3, cranial nerves II-XII grossly intact, strength grossly intact, sensation intact to light touch  Lab Results: Basic Metabolic Panel:  Recent Labs Lab 08/07/12 0500 08/08/12 0540  NA 132* 134*  K 4.0 4.4  CL 95* 98  CO2 28 26  GLUCOSE 137* 142*  BUN 40* 37*  CREATININE 2.12* 1.86*  CALCIUM 9.1 9.3  MG 2.1 2.2   CBC:  Recent Labs Lab  08/05/12 0500 08/06/12 0556  WBC 6.8 5.8  HGB 14.5 14.3  HCT 42.9 42.7  MCV 84.6 82.6  PLT 219 197   Cardiac Enzymes:  Recent Labs Lab 08/04/12 1542 08/04/12 2345 08/05/12 0500  TROPONINI 0.51* 0.82* 0.92*   BNP:  Recent Labs Lab 08/04/12 1426  PROBNP 5537.0*   CBG:  Recent Labs Lab 08/06/12 2140 08/07/12 0803 08/07/12 1238 08/07/12 1627 08/07/12 2036 08/08/12 0801  GLUCAP 153* 116* 179* 157* 150* 137*   Coagulation:  Recent Labs Lab 08/04/12 1542  LABPROT 15.8*  INR 1.29   Urine Drug Screen: Drugs of Abuse     Component Value Date/Time   LABOPIA NONE DETECTED 08/04/2012 1742   LABOPIA NEG 02/01/2012 1356   COCAINSCRNUR POSITIVE* 08/04/2012 1742   COCAINSCRNUR PPS 02/01/2012 1356   LABBENZ NONE DETECTED 08/04/2012 1742   LABBENZ PPS 02/01/2012 1356   LABBENZ NEG 07/09/2010 1522   AMPHETMU NONE DETECTED 08/04/2012 1742   AMPHETMU NEG 07/09/2010 1522   THCU NONE DETECTED 08/04/2012 1742   LABBARB NONE DETECTED 08/04/2012 1742   LABBARB NEG 02/01/2012 1356     Studies/Results: Dg Chest Port 1 View  08/06/2012   *RADIOLOGY REPORT*  Clinical Data: PICC placement.  PORTABLE CHEST - 1 VIEW  Comparison: Single view of the chest 08/04/2012.  Findings: New right PICC is in place with the tip in the  mid superior vena cava.  There is cardiomegaly but no edema.  Lungs are clear.  AICD noted.  IMPRESSION:  1.  Tip of right PICC projects over the mid superior vena cava. 2.  Cardiomegaly without acute disease.   Original Report Authenticated By: Holley Dexter, M.D.   Medications: I have reviewed the patient's current medications. Scheduled Meds: . aspirin EC  81 mg Oral Daily  . atorvastatin  80 mg Oral q1800  . clopidogrel  75 mg Oral Q breakfast  . digoxin  0.125 mg Oral Daily  . heparin subcutaneous  5,000 Units Subcutaneous Q8H  . hydrALAZINE  10 mg Oral Q8H  . insulin aspart  0-9 Units Subcutaneous TID WC  . isosorbide mononitrate  60 mg Oral Daily  . [START  ON 08/09/2012] metolazone  2.5 mg Oral Q Wed,Sat  . mirtazapine  30 mg Oral QHS  . pantoprazole  40 mg Oral Daily  . sodium chloride  10-40 mL Intracatheter Q12H  . torsemide  80 mg Oral Daily   Continuous Infusions:   PRN Meds:.acetaminophen, hydrOXYzine, morphine injection, nitroGLYCERIN, ondansetron (ZOFRAN) IV, sodium chloride  Assessment/Plan: Mr. Windhorst is a 47yo man with history of systolic heart failure (EF 10% on 07/18/12), CAD s/p NSTEMI 2 weeks ago (thought to be cocaine induced), CKD stage III, HTN, HL, OSA, obesity, cocaine abuse admitted for CHF exacerbation and NSTEMI.   # NSTEMI - Pt was admitted with chest pain, SOB, and elevated troponin, with UDS+ for cocaine, and weight up 22 lbs since last admission. NSTEMI likely represents cocaine-induced vasospasm vs demand ischemia (2/2 acute CHF exacerbation) vs NSTEMI type 1 (history of CAD, subtotal thrombosis of distal OM1 seen on cath 6/30). -cardiology consulted, appreciate recs -continue aspirin and plavix given recent NSTEMI -atorvastatin  -coreg  -continue imdur, digoxin  -treat CHF per below   # Acute on chronic systolic CHF - Pt has a history of severe systolic CHF, last echo 7/1/14EF 10% with diffuse hypokinesis. The patient notes orthopnea, PND, and LE edema, with a 22-lb weight gain (weight on last admission 206), and a pro-BNP of 5537 (elevated from 4881 last admit). The patient's home diuretic regimen is torsemide 80 mg daily, with Metolazone 2.5 mg every M&F added 7/17 at the patient's most recent CHF clinic visit. The patient has an ICD in place.  -per cardiology, stopped lasix drip yesterday and restarted home regimen of torsemide 80mg  daily w/ metolazone 2.5mg  twice weekly - stopped milrinone today - per cardiology, will increase hydralazine to 25mg  tid -continue coreg -replete lytes as needed - goal is Mg>2 and K>4 given his extensive heart disease -patient with intermittent episodes of asymptomatic non-sustained  V tach- this am had run of 17 beats- milrinone was discontinued which should help -transfer to tele floor today  # CKD stage 3 - Cr had been trending up likely due to cardiorenal syndrome, but started trending down yesterday and is back to baseline today. -stopped milrinone today  # Cocaine abuse - UDS positive for cocaine this admission  -strongly encouraged cessation   # DM2 - A1C = 7.9 last admission -SSI sensitive   # HTN - chronic, stable  -continue coreg, imdur, torsemide, metolazone  # OSA - CPAP  # VTE- heparin  #Diet- heart healthy  Dispo: Disposition is deferred at this time, awaiting improvement of current medical problems.  Anticipated discharge in approximately 1-2 day(s).   The patient does have a current PCP Darden Palmer, MD) and does not need an [facility]  hospital follow-up appointment after discharge.  .Services Needed at time of discharge: Y = Yes, Blank = No PT:   OT:   RN:   Equipment:   Other:     LOS: 4 days   Windell Hummingbird, MD 08/08/2012, 8:16 AM

## 2012-08-08 NOTE — Progress Notes (Signed)
Patient ID: [name], male   DOB: [birth year], 47 y.o.   MRN: [id number]    SUBJECTIVE:   Placed on Milrinone at 0.25 mcg and lasix drip at 10 mg per hour. Yesterday lasix drip stopped and Milrinone was cut back to 0.125 mcg daily.   No weight available. 25 hour I/O -1/5 liters. Baseline weight 205-208 pounds. +UDS cocaine.   Denies SOB/PND/Orthopnea.  Had 15 beat run NSVT.  . CVP 6  . aspirin EC  81 mg Oral Daily  . atorvastatin  80 mg Oral q1800  . clopidogrel  75 mg Oral Q breakfast  . digoxin  0.125 mg Oral Daily  . heparin subcutaneous  5,000 Units Subcutaneous Q8H  . hydrALAZINE  10 mg Oral Q8H  . insulin aspart  0-9 Units Subcutaneous TID WC  . isosorbide mononitrate  60 mg Oral Daily  . [START ON 08/09/2012] metolazone  2.5 mg Oral Q Wed,Sat  . mirtazapine  30 mg Oral QHS  . pantoprazole  40 mg Oral Daily  . sodium chloride  10-40 mL Intracatheter Q12H  . torsemide  80 mg Oral Daily    Filed Vitals:   08/08/12 0003 08/08/12 0040 08/08/12 0400 08/08/12 0816  BP:    119/86  Pulse: 85 93 80 87  Temp:  98 F (36.7 C)  98.2 F (36.8 C)  TempSrc:  Oral  Oral  Resp: 20 17 22 20   Height:      Weight:      SpO2: 98% 99% 92% 100%    Intake/Output Summary (Last 24 hours) at 08/08/12 0828 Last data filed at 08/08/12 1324  Gross per 24 hour  Intake  685.1 ml  Output   3350 ml  Net -2664.9 ml    LABS: Basic Metabolic Panel:  Recent Labs  40/10/27 0500 08/08/12 0540  NA 132* 134*  K 4.0 4.4  CL 95* 98  CO2 28 26  GLUCOSE 137* 142*  BUN 40* 37*  CREATININE 2.12* 1.86*  CALCIUM 9.1 9.3  MG 2.1 2.2   Liver Function Tests: No results found for this basename: AST, ALT, ALKPHOS, BILITOT, PROT, ALBUMIN,  in the last 72 hours No results found for this basename: LIPASE, AMYLASE,  in the last 72 hours CBC:  Recent Labs  08/06/12 0556  WBC 5.8  HGB 14.3  HCT 42.7  MCV 82.6  PLT 197   Cardiac Enzymes: No results found for this basename: CKTOTAL, CKMB,  CKMBINDEX, TROPONINI,  in the last 72 hours BNP: No components found with this basename: POCBNP,  D-Dimer: No results found for this basename: DDIMER,  in the last 72 hours Hemoglobin A1C: No results found for this basename: HGBA1C,  in the last 72 hours Fasting Lipid Panel: No results found for this basename: CHOL, HDL, LDLCALC, TRIG, CHOLHDL, LDLDIRECT,  in the last 72 hours Thyroid Function Tests: No results found for this basename: TSH, T4TOTAL, FREET3, T3FREE, THYROIDAB,  in the last 72 hours Anemia Panel: No results found for this basename: VITAMINB12, FOLATE, FERRITIN, TIBC, IRON, RETICCTPCT,  in the last 72 hours  RADIOLOGY: Dg Chest 2 View  08/04/2012   *RADIOLOGY REPORT*  Clinical Data: Chest pain,  shortness of breath and weakness.  CHEST - 2 VIEW  Comparison: 07/15/2012.  Findings: Single lead pacer good position from left-sided approach. Cardiomegaly.  Mild vascular congestion.  Negative osseous structures.  No effusion or pneumothorax.  Improved aeration from priors.  IMPRESSION: Cardiomegaly with mild vascular congestion.  Improved congestive  heart failure.   Original Report Authenticated By: Davonna Belling, M.D.   Dg Chest Portable 1 View  07/15/2012   *RADIOLOGY REPORT*  Clinical Data: Chest pain.  PORTABLE CHEST - 1 VIEW  Comparison: 06/16/2012.  Findings: The heart is mildly enlarged.  There is a right ventricular ICD pacer wire with left-sided generator.  The mediastinal and hilar contours are normal.  There is mild, generalized diffuse interstitial thickening.  No alveolar disease. No pneumothoraces or evident effusions.  IMPRESSION: Cardiomegaly and interstitial thickening consistent with congestive heart failure.   Original Report Authenticated By: Sander Radon, M.D.    PHYSICAL EXAM - CVP 6 personally checked General: NAD resting in bed  Neck: JVP flat no thyromegaly or thyroid nodule.  Lungs: Clear  CV: Lateral PMI.  Heart regular S1/S2, no S3/S4, no murmur.  No  peripheral edema.   Abdomen: Soft, nontender, no hepatosplenomegaly, no distention.  Neurologic: Alert and oriented x 3.  Psych: Normal affect. Extremities: No clubbing or cyanosis.  Cool. RUE PICC  TELEMETRY: Reviewed telemetry pt in NSR with 2 runs of 4 beats NSVT  ASSESSMENT AND PLAN: 47 yo with history of nonischemic CMP, cocaine abuse, recent NSTEMI, and CKD with poor medication compliance returned to hospital with acute on chronic systolic CHF and chest pain.  1. Acute on chronic systolic CHF: Nonischemic cardiomyopathy with low output state on admission.  CVP 6. No weight available this am but 24 hour I/O -1.5 liters. Now at baseline weight  (205-208 pounds).  - Start torsemide 80 mg daily with metolazone twice a week. Creatinine back at baseline.   - Stop Milrinone today. - Continue low dose digoxin, digoxin level ok. No beta blocker due to ongoing cocaine use. + UDS this admit.  - Increase hydralazine to 25 mg tid, continue current Imdur.  - PICC for co-ox and CVPs. CVP 6, CO-OX 98 will repeat 2. CAD: Thrombotic occlusion of distal OM1 noted on cath after recent presentation with NSTEMI (too small caliber for intervention). He was UDS + for cocaine at that time. Suspect he may have had a cocaine-triggered plaque rupture. There was no thrombus in the LV cavity on echo.  TnI was > 20 at that time, now TnI in 0.51-0.92 range.  This may be residual elevation in the face of CKD versus TnI elevation due to demand ischemia with volume overload.  Of note, he was cocaine positive again.  He will need to continue ASA, Plavix, and statin.   3. CKD:  Creatinine baseline 1.6-1.8.  Creatinine trending down to baseline.  4. NSVT:  Mg ok. He has ICD.  Not on Coreg with cocaine use.  NSVT run this morning.  As above, will shut off milrinone which should help.  5. Noncompliance 6. Poor long-term prognosis.  He is homeless, not particularly compliant with meds, and uses cocaine.   Palliative Care Consult  appreciated. Will need HAG once discharged but Mr Leonhardt is not ready to turn off ICD.    [name] 08/08/2012 8:31 AM

## 2012-08-09 ENCOUNTER — Telehealth: Payer: Self-pay | Admitting: *Deleted

## 2012-08-09 LAB — BASIC METABOLIC PANEL
BUN: 37 mg/dL — ABNORMAL HIGH (ref 6–23)
CO2: 26 mEq/L (ref 19–32)
Glucose, Bld: 116 mg/dL — ABNORMAL HIGH (ref 70–99)
Potassium: 4.2 mEq/L (ref 3.5–5.1)
Sodium: 134 mEq/L — ABNORMAL LOW (ref 135–145)

## 2012-08-09 MED ORDER — INSULIN GLARGINE 100 UNIT/ML ~~LOC~~ SOLN: 10.0000 [IU] | Freq: Every day | SUBCUTANEOUS | Status: DC

## 2012-08-09 MED ORDER — TORSEMIDE 20 MG PO TABS: 80.0000 mg | ORAL_TABLET | Freq: Every day | ORAL | Status: DC

## 2012-08-09 MED ORDER — HYDROCODONE-ACETAMINOPHEN 5-325 MG PO TABS
1.0000 | ORAL_TABLET | ORAL | Status: DC | PRN
  Filled 2012-08-09: qty 1

## 2012-08-09 MED ORDER — HYDRALAZINE HCL 25 MG PO TABS: 25.0000 mg | ORAL_TABLET | Freq: Three times a day (TID) | ORAL | Status: DC

## 2012-08-09 MED ORDER — CLOPIDOGREL BISULFATE 75 MG PO TABS: 75.0000 mg | ORAL_TABLET | Freq: Every day | ORAL | Status: DC

## 2012-08-09 MED ORDER — METOLAZONE 2.5 MG PO TABS: 2.5000 mg | ORAL_TABLET | ORAL | Status: DC

## 2012-08-09 MED ORDER — OXYCODONE HCL 5 MG PO TABS
5.0000 mg | ORAL_TABLET | ORAL | Status: DC | PRN
  Administered 2012-08-09: 5 mg via ORAL
  Filled 2012-08-09: qty 1

## 2012-08-09 MED ORDER — OXYCODONE HCL 5 MG PO CAPS: 5.0000 mg | ORAL_CAPSULE | Freq: Four times a day (QID) | ORAL | Status: DC | PRN

## 2012-08-09 MED ORDER — HYDRALAZINE HCL 25 MG PO TABS: 37.5000 mg | ORAL_TABLET | Freq: Three times a day (TID) | ORAL | Status: DC

## 2012-08-09 MED ORDER — HYDROCODONE-ACETAMINOPHEN 5-325 MG PO TABS: 1.0000 | ORAL_TABLET | Freq: Four times a day (QID) | ORAL | Status: DC | PRN

## 2012-08-09 NOTE — Progress Notes (Signed)
Internal Medicine Attending  Date: 08/09/2012  Patient name: [name] Medical record number: [id number] Date of birth: 1965/06/25 Age: 47 y.o. Gender: male  I saw and evaluated the patient. I reviewed the resident's note by Dr. Darci Needle and I agree with the resident's findings and plans as documented in her progress note.  Mr. Sidney Ace has been without any complaints of SOB.  He continues to diurese on his oral regimen.  He is at dry weight and is stable for discharge home.  Dr. Darci Needle, Heart Failure Team, and Case Management have been trying to help secure medication for Mr. Steve and he has been extensively counseled on the importance of medication compliance and cocaine cessation.

## 2012-08-09 NOTE — Care Management Note (Signed)
Care Manager faxed order to [facility] for Hospice RN to evaluate for Hospice services. No further needs from CM at this time. Gala Lewandowsky, RN,BSN [phone]

## 2012-08-09 NOTE — Progress Notes (Signed)
Subjective: Patient still complaining of having constant chest pain, this is not associated with SOB and has been unchanged for a few weeks. Patient diuresed 3L over the past 24 hours. Patient has no further complaints. He is ready to go home today. He will need some assistance obtaining his medications, will speak with case management regarding this issue. He plans to return to [facility] shelter.  Objective: Vital signs in last 24 hours: Filed Vitals:   08/08/12 2130 08/09/12 0053 08/09/12 0436 08/09/12 0454  BP: 104/68   103/61  Pulse: 80 82  85  Temp: 98.2 F (36.8 C)   97.3 F (36.3 C)  TempSrc: Oral   Oral  Resp: 18 17  18   Height:      Weight:   208 lb 12.8 oz (94.711 kg)   SpO2: 99% 95%  100%    Intake/Output Summary (Last 24 hours) at 08/09/12 0800 Last data filed at 08/09/12 0005  Gross per 24 hour  Intake    460 ml  Output   3550 ml  Net  -3090 ml   General: sleeping in bed, appears very comfortable HEENT: pupils equal round and reactive to light, vision grossly intact, oropharynx clear and non-erythematous  Neck: supple Lungs: CTAB, normal work of respiration  Heart: central chest is tender to palpation; regular rate and rhythm, no murmurs, gallops, or rubs; no JVD Abdomen: soft, non-tender, non-distended, normal bowel sounds Extremities: left forearm with palpable cord, no erythema; trace edema to BLE Neurologic: alert & oriented X3, cranial nerves II-XII grossly intact, strength grossly intact, sensation intact to light touch  Lab Results: Basic Metabolic Panel:  Recent Labs Lab 08/07/12 0500 08/08/12 0540 08/09/12 0433  NA 132* 134* 134*  K 4.0 4.4 4.2  CL 95* 98 98  CO2 28 26 26   GLUCOSE 137* 142* 116*  BUN 40* 37* 37*  CREATININE 2.12* 1.86* 1.96*  CALCIUM 9.1 9.3 8.9  MG 2.1 2.2  --    CBC:  Recent Labs Lab 08/05/12 0500 08/06/12 0556  WBC 6.8 5.8  HGB 14.5 14.3  HCT 42.9 42.7  MCV 84.6 82.6  PLT 219 197   Cardiac  Enzymes:  Recent Labs Lab 08/04/12 1542 08/04/12 2345 08/05/12 0500  TROPONINI 0.51* 0.82* 0.92*   BNP:  Recent Labs Lab 08/04/12 1426  PROBNP 5537.0*   CBG:  Recent Labs Lab 08/07/12 1627 08/07/12 2036 08/08/12 0801 08/08/12 1218 08/08/12 1655 08/08/12 2035  GLUCAP 157* 150* 137* 225* 152* 148*   Coagulation:  Recent Labs Lab 08/04/12 1542  LABPROT 15.8*  INR 1.29   Urine Drug Screen: Drugs of Abuse     Component Value Date/Time   LABOPIA NONE DETECTED 08/04/2012 1742   LABOPIA NEG 02/01/2012 1356   COCAINSCRNUR POSITIVE* 08/04/2012 1742   COCAINSCRNUR PPS 02/01/2012 1356   LABBENZ NONE DETECTED 08/04/2012 1742   LABBENZ PPS 02/01/2012 1356   LABBENZ NEG 07/09/2010 1522   AMPHETMU NONE DETECTED 08/04/2012 1742   AMPHETMU NEG 07/09/2010 1522   THCU NONE DETECTED 08/04/2012 1742   LABBARB NONE DETECTED 08/04/2012 1742   LABBARB NEG 02/01/2012 1356    Medications: I have reviewed the patient's current medications. Scheduled Meds: . aspirin EC  81 mg Oral Daily  . atorvastatin  80 mg Oral q1800  . clopidogrel  75 mg Oral Q breakfast  . digoxin  0.125 mg Oral Daily  . heparin subcutaneous  5,000 Units Subcutaneous Q8H  . hydrALAZINE  25 mg Oral Q8H  .  insulin aspart  0-9 Units Subcutaneous TID WC  . isosorbide mononitrate  60 mg Oral Daily  . metolazone  2.5 mg Oral Q Wed,Sat  . mirtazapine  30 mg Oral QHS  . pantoprazole  40 mg Oral Daily  . sodium chloride  10-40 mL Intracatheter Q12H  . torsemide  80 mg Oral Daily   Continuous Infusions:   PRN Meds:.acetaminophen, hydrOXYzine, morphine injection, nitroGLYCERIN, ondansetron (ZOFRAN) IV, sodium chloride  Assessment/Plan: Mr. 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   # NSTEMI - Pt was admitted with chest pain, SOB,  and elevated troponin, with UDS+ for cocaine. NSTEMI likely represents cocaine-induced vasospasm vs demand ischemia (2/2 acute CHF exacerbation) vs NSTEMI type 1 (history of CAD, subtotal thrombosis of distal OM1 seen on cath 6/30). -cardiology consulted, appreciate recs -continue aspirin 81mg  and plavix 75mg  given recent NSTEMI -atorvastatin 80 mg daily -no beta blocker 2/2 cocaine use -continue imdur 60mg  daily, digoxin .125mg  daily -treat CHF per below   # Acute on chronic systolic CHF - Pt has a history of severe systolic CHF, last echo 7/1/14EF 10% with diffuse hypokinesis. The patient had orthopnea, PND, and LE edema, with a 22-lb weight gain (weight on last admission 206), and a pro-BNP of 5537 (elevated from 4881 last admit) upon admission. He has diuresed well and his weight is down to 208 and he denies SOB. Patient has been doing well on his home diuretic regimen for the past two days. -continue torsemide 80mg  daily w/ metolazone 2.5mg  twice weekly (Wed/Fri) - stopped milrinone yesterday - hydralazine increased to 37.5mg  tid per cardiology  # CKD stage 3 - Cr had been trending up likely due to cardiorenal syndrome, but started trending down two days ago and reached about baseline yesterday. -stopped milrinone yesterday  # Cocaine abuse - UDS positive for cocaine this admission and prior recent admission.  -I had a long conversation with regard to his cocaine use. Patient states that he will not use any longer because he wants to live as long as possible for his kids.  # DM2 - A1C = 7.9 last admission -SSI sensitive   # HTN - chronic, stable  -continue imdur, torsemide, metolazone, hydralazine  # OSA - CPAP  # VTE- heparin  #Diet- heart healthy  Dispo: Discharge today.  The patient does have a current PCP Darden Palmer, MD) and does not need an [facility] hospital follow-up appointment after discharge.  .Services Needed at time of discharge: Y = Yes, Blank = No PT:   OT:     RN:   Equipment:   Other:     LOS: 5 days   Windell Hummingbird, MD 08/09/2012, 8:00 AM

## 2012-08-09 NOTE — Telephone Encounter (Signed)
REFERRAL FAXED TO [facility]- [phone]  / [phone]. WAITING FOR APPOINTMENT.   [name] NTII  [phone]

## 2012-08-09 NOTE — Telephone Encounter (Signed)
Pt to be released today, hospice will f/u, dr Virgina Organ cont as pcp?

## 2012-08-09 NOTE — Telephone Encounter (Signed)
Not sure how that works, does hospice take over? He is a new patient to me, only seen him once.  Will this be residential hospice since he is homeless

## 2012-08-09 NOTE — Discharge Summary (Signed)
Name: [name] MRN: [id number] DOB: 05-Apr-1965 47 y.o. 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     clorazepate 7.5 MG tablet  Commonly known as:  TRANXENE  Take 1 tablet (7.5 mg total) by mouth daily. Take three tablets by mouth three times a day (1 in the morning and 2 in the evening): For anxiety     digoxin 0.125 MG tablet  Commonly known as:  LANOXIN  Take 1 tablet (0.125 mg total) by mouth daily. For heart problems     hydrALAZINE 25 MG tablet  Commonly known as:  APRESOLINE  Take 1.5 tablets (37.5 mg total) by mouth every 8 (eight) hours.     hydrOXYzine 25 MG tablet  Commonly known as:  ATARAX/VISTARIL  Take 1-2 tablets (25-50 mg total) by mouth 3 (three) times daily as needed for itching or anxiety. Take 1 or 2 tablets by mouth 3 times daily as needed for itching.(Max of 6 tablets a day): For anxiety/itching     insulin glargine 100 UNIT/ML injection  Commonly known as:  LANTUS  Inject 0.1 mLs (10 Units total) into the skin at bedtime.     isosorbide mononitrate 60 MG 24 hr tablet  Commonly known as:  IMDUR    Take 1 tablet (60 mg total) by mouth daily. For chronic chest pains     metolazone 2.5 MG tablet  Commonly known as:  ZAROXOLYN  Take 1 tablet (2.5 mg total) by mouth every Wednesday and Saturday.     omeprazole 20 MG capsule  Commonly known as:  PRILOSEC  Take 1 capsule (20 mg total) by mouth daily. For acid reflux     oxycodone 5 MG capsule  Commonly known as:  OXY-IR  Take 1 capsule (5 mg total) by mouth every 6 (six) hours as needed.     potassium chloride 10 MEQ tablet  Commonly known as:  K-DUR  Take 1 tablet (10 mEq total) by mouth daily. For low potassium     rosuvastatin 10 MG tablet  Commonly known as:  CRESTOR  Take 1 tablet (10 mg total) by mouth daily. For control of high cholesterol     torsemide 20 MG tablet  Commonly known as:  DEMADEX  Take 4 tablets (80 mg total) by mouth daily. For control of high blood pressure/sewllings.        Disposition and follow-up:   [name] was discharged from [facility] in Stable condition.  At the hospital follow up visit please address:  1.  Severe systolic CHF- please ensure patient is taking the correct medications. Check weight (dry weight approx 205-208). 2. Access to medications- heart failure team was able to get patient a 34 day supply of his medications (all except plavix, narcotics, and insulin); he will need a longer term solution. He might benefit from a meeting with [name] in clinic. Per patient's pharmacy, plavix should only cost $2 and patient reports that he already had his lantus at home. Patient will need to find a way to pay for his oxycodone on his own as case management was unable to assist with that need. 3. Cocaine abuse- Emphasized to patient that he needs to stop using cocaine. He agrees that he should stop, it is unclear whether or not he will. Will need to avoid all beta blockers given his use of cocaine. Cardiology started him on hydralazine during this admission and  stopped coreg.   2.  Labs / imaging needed at time of follow-up: Bmet at next appointment per cardiology  3.  Pending labs/ test needing follow-up: none  Follow-up Appointments: Follow-up Information   Follow up with Jonathan Meres, MD On 08/16/2012. (10:20)    Contact information:   [address] [place] [address] [phone]       Follow up with Jonathan Palmer, MD On 09/05/2012. (1:15pm)    Contact information:   [address] [place] [address] [phone]       Discharge Instructions: Discharge Orders   Future Appointments Provider Department Dept Phone   08/16/2012 10:20 AM [facility] [phone]   09/05/2012 1:15 PM Jonathan Palmer, MD [facility] [phone]   Future Orders Complete By Expires     (HEART FAILURE PATIENTS) Call MD:  Anytime you have any of the following symptoms: 1) 3 pound weight gain in 24 hours or 5 pounds in 1 week 2) shortness of breath, with or without a dry hacking cough 3) swelling in the hands, feet or stomach 4) if you have to sleep on extra pillows at night in order to breathe.  As directed     Call MD for:  severe uncontrolled pain  As directed     Call MD for:  temperature >100.4  As directed     Diet - low sodium heart healthy  As directed     Increase activity slowly  As directed       Consultations: [name], Cardiology  Procedures Performed:  Dg Chest 2 View  08/04/2012   *RADIOLOGY REPORT*  Clinical Data: Chest pain,  shortness of breath and weakness.  CHEST - 2 VIEW  Comparison: 07/15/2012.  Findings: Single lead pacer good position from left-sided approach. Cardiomegaly.  Mild vascular congestion.  Negative osseous structures.  No effusion or pneumothorax.  Improved aeration from priors.  IMPRESSION: Cardiomegaly with mild vascular congestion.  Improved congestive heart failure.   Original Report  Authenticated By: [name], M.D.   Dg Chest Port 1 View  08/06/2012   *RADIOLOGY REPORT*  Clinical Data: PICC placement.  PORTABLE CHEST - 1 VIEW  Comparison: Single view of the chest 08/04/2012.  Findings: New right PICC is in place with the tip in the mid superior vena cava.  There is cardiomegaly but no edema.  Lungs are clear.  AICD noted.  IMPRESSION:  1.  Tip of right PICC projects over the mid superior vena cava. 2.  Cardiomegaly without acute disease.  Original Report Authenticated By: Holley Dexter, M.D.   Admission HPI:  Mr. Cordaro is a 46 y/o man with history of systolic heart failure (EF 10% on 07/18/12), CAD s/p NSTEMI at last admission (d/c 7/2), CKD stage III, HTN, HL, OSA, obesity, cocaine abuse presenting with chest pain, shortness of breath. Pt states that his chest pain has not gone away since his last admission but has been worse the last several days and is now associated with shortness of breath, diaphoresis, and left arm numbness. Pt describes the chest pain as substernal, like a weight on his chest with numbness in his left arm. Pt is also having a wet cough without sputum production, and his weight is up 22 pounds- 206 on 7/2 (pt was diuresed 20+ pounds at last admission), 228 now. Pt is experiencing orthopnea (6 pillows) and PND, states he often only sleeps for 20 min then wakes up gasping for air.   Pt states he took all of the medications he was given at 7/2 discharge as prescribed until he ran out. He gets paid in a week in a half and was not able to afford more medications until then. He was seen in clinic on 7/16 and given lasix 40mg  PO at that time. Pt denies drug use including cocaine but UDS in ED + for cocaine.   Pt also complaining of frontal headaches, some relief from NSAIDs. Pt thinks he has a cold now but no fevers, no nasal discharge. No vision changes, abdominal pain, changes in bowel or bladder habits, weakness.   Hospital Course by problem list:   #  NSTEMI - Pt presented with chest pain, SOB, and elevated troponin to 0.51, with UDS+ for cocaine, and weight up 22 lbs since last admission. NSTEMI likely represents cocaine-induced vasospasm vs demand ischemia (2/2 acute CHF exacerbation). Patient was started on a heparin drip initially, but since this was thought to be due to demand ischemia or cocaine-induced vasospasm, cardiology discontinued heparin on day 2 of hospitalization. We continued aspirin and plavix given his recent NSTEMI, both of which he should receive for 1 year. We continued his atorvastatin, imdur, digoxin, though we stopped coreg given his history of cocaine abuse. Given the NSTEMI could have been demand ischemia 2/2 his CHF exacerbation, we focused our interventions on treating the acute on chronic CHF.   # Acute on Chronic CHF - Pt has a history of severe systolic CHF with his last echo 07/18/12 showing EF 10% with diffuse hypokinesis. The patient presented with orthopnea, PND, and LE edema, with a 22-lb weight gain (dry weight approx. 206), and a pro-BNP of 5537 (elevated from 4881 last admit). Cardiology was consulted and patient was managed in the stepdown unit on a lasix drip. Patient did not diurese well initially and his creatinine increased to 2.23 (baseline around 1.9), thought to be due to cardiorenal syndrome. Therefore, patient was started on milrinone drip, which improved the diuresis dramatically and over the course of three days he was back to his dry weight. Cardiology discontinued his coreg given his cocaine abuse, and slowly added increasing doses of hydralazine to his regimen (up to 37.5mg  tid by the time of discharge). Once patient was approximately at his dry weight, we discontinued the lasix drip and restarted his home diuretic regimen of torsemide and metolazone (torsemide had been recently increased to 80 mg, with Metolazone 2.5 mg every M&F added day prior to patient's admission during his visit to CHF clinic). Of  note, patient was monitored  on telemetry during his admission and had three runs of asymptomatic nonsustained V tach during his admission (longest run up to 17 beats). Electrolytes (Mg and K) were repleted multiple times during his admission to decrease myocardial irritability. The patient has an ICD in place. At time of discharge patient still endorsed having constant central chest pain that had not changed or progressed during the course of his admission, denied having any SOB. Cardiology did not think this was cardiac in origin as it is partially reproducible with palpation. Patient was discharged with approx 1 week supply of oxycodone to manage this pain. Heart failure team was able to provide a 34 day supply of his medications (besides oxycodone, plavix, and lantus). He will likely need further assistance obtaining his medications in the future and may benefit from a meeting with [name] in clinic.  # CKD stage 3 - Cr of 1.9 upon admission, which is around patient's baseline. However, his Cr increased to 2.23 at its highest point at which time milrinone was added to his regimen to treat possible cardiorenal syndrome. Two days after milrinone was initiated, patient's Cr began trending down again and was at 1.96 at time of discharge.   # Cocaine abuse - UDS positive for cocaine this admission. Discussed at length with patient the risk of continuing to use cocaine. He understands the risks and says that he will try not to use after he is discharged since he wants to live as long as possible for his children.   # DM2 - A1C 7.9 last admission in the beginning of July 2014. Patient was treated with sensitive SSI, CBG ranged from 130-low 200s. He was discharged on lantus 10 units qhs, regimen started by his PCP during his last office visit approx 1 week ago. Patient reports that he already has this medication at home, so I did not write for this prescription.   Discharge Vitals:   BP 114/72  Pulse 99   Temp(Src) 98.2 F (36.8 C) (Oral)  Resp 18  Ht 6" (0.152 m)  Wt 208 lb 12.8 oz (94.711 kg)  BMI 4,099.33 kg/m2  SpO2 99%  Discharge Labs:  Results for orders placed during the hospital encounter of 08/04/12 (from the past 24 hour(s))  GLUCOSE, CAPILLARY     Status: Abnormal   Collection Time    08/08/12  4:55 PM      Result Value Range   Glucose-Capillary 152 (*) 70 - 99 mg/dL  GLUCOSE, CAPILLARY     Status: Abnormal   Collection Time    08/08/12  8:35 PM      Result Value Range   Glucose-Capillary 148 (*) 70 - 99 mg/dL   Comment 1 Notify RN    BASIC METABOLIC PANEL     Status: Abnormal   Collection Time    08/09/12  4:33 AM      Result Value Range   Sodium 134 (*) 135 - 145 mEq/L   Potassium 4.2  3.5 - 5.1 mEq/L   Chloride 98  96 - 112 mEq/L   CO2 26  19 - 32 mEq/L   Glucose, Bld 116 (*) 70 - 99 mg/dL   BUN 37 (*) 6 - 23 mg/dL   Creatinine, Ser 7.82 (*) 0.50 - 1.35 mg/dL   Calcium 8.9  8.4 - 95.6 mg/dL   GFR calc non Af Amer 40 (*) >90 mL/min   GFR calc Af Amer 46 (*) >90 mL/min  GLUCOSE, CAPILLARY     Status: Abnormal  Collection Time    08/09/12 11:27 AM      Result Value Range   Glucose-Capillary 195 (*) 70 - 99 mg/dL    Signed: Windell Hummingbird, MD 08/09/2012, 3:14 PM   Time Spent on Discharge: 35 minutes Services Ordered on Discharge: none Equipment Ordered on Discharge: none

## 2012-08-09 NOTE — Progress Notes (Signed)
   CARE MANAGEMENT NOTE 08/09/2012  Patient:  [name], [name]   Account Number:  [id number]  Date Initiated:  08/09/2012  Documentation initiated by:  GRAVES-BIGELOW,Tansy Lorek  Subjective/Objective Assessment:   Pt admitted for Acute on chronic systolic CHF. Pt states he lives @ [facility]. He states he has children and he has a friend watching them. Per pt he has a room being prepared at [facility]. CSW aware and will provide resources.     Action/Plan:   HF clinic to provide 34 day supply of some meds- to be picked up at the Outpatient Clinic. Per pt he has lantus at home. CM did call  [facility] for cost of Plavix.   Anticipated DC Date:  08/09/2012   Anticipated DC Plan:  HOME/SELF CARE  In-house referral  Clinical Social Worker      DC Planning Services  CM consult  Medication Assistance      PAC Choice  HOSPICE   Choice offered to / List presented to:  C-1 Patient        HH arranged  HH-1 RN      HH agency  [facility]   Status of service:  Completed, signed off Medicare Important Message given?   (If response is "NO", the following Medicare IM given date fields will be blank) Date Medicare IM given:   Date Additional Medicare IM given:    Discharge Disposition:  HOME W HOSPICE CARE  Per UR Regulation:  Reviewed for med. necessity/level of care/duration of stay  If discussed at Long Length of Stay Meetings, dates discussed:    Comments:   08-09-12 1135 Tomi Bamberger, RN,BSN [phone] Pt will return to [facility]ter at [facility] and then hopefully by next week will transition to [facility]. Cm did call Kathlene November with [facility] and he will pick him up today. Kathlene November will pay for the medications at [facility] cost of 2.00 plavix and 2.00 vicodin. Pt is aware of medications at the outpatient pharmacy. Lorinda Creed NP with Palliative Care spoke to pt and pt is agreeable to hospice to f/u with pt once d/c. Pt chose  [facility]. She will make [facility] aware. No further needs from CM at this time.   CM not able to use [facility]- pt showing up has medicare part D and we are unable to assist at this time for plavix. CM called [facility] and cost of plavix wil be 2.00. No further needs from CM at this time. Resident is aware. Tomi Bamberger, RN,BSN [phone]

## 2012-08-09 NOTE — Care Management Note (Signed)
    Page 1 of 1   08/09/2012     11:03:49 AM   CARE MANAGEMENT NOTE 08/09/2012  Patient:  [name], [name]   Account Number:  [id number]  Date Initiated:  08/09/2012  Documentation initiated by:  GRAVES-BIGELOW,Emunah Texidor  Subjective/Objective Assessment:   Pt admitted for Acute on chronic systolic CHF. Pt states he lives @ [facility]. He states he has children and he has a friend watching them. Per pt he has a room being prepared at [facility]. CSW aware and will provide resources.     Action/Plan:   HF clinic to provide 34 day supply of some meds- to be picked up at the Outpatient Clinic. Per pt he has lantus at home. CM did call  [facility] for cost of Plavix.   Anticipated DC Date:  08/09/2012   Anticipated DC Plan:  HOME/SELF CARE  In-house referral  Clinical Social Worker      DC Planning Services  CM consult  Medication Assistance      Choice offered to / List presented to:             Status of service:  Completed, signed off Medicare Important Message given?   (If response is "NO", the following Medicare IM given date fields will be blank) Date Medicare IM given:   Date Additional Medicare IM given:    Discharge Disposition:  HOME/SELF CARE  Per UR Regulation:  Reviewed for med. necessity/level of care/duration of stay  If discussed at Long Length of Stay Meetings, dates discussed:    Comments:  CM not able to use [facility]- pt showing up has medicare part D and we are unable to assist at this time for plavix. CM called [facility] and cost of plavix wil be 2.00. No further needs from CM at this time. Resident is aware. Tomi Bamberger, RN,BSN [phone]

## 2012-08-09 NOTE — Progress Notes (Signed)
CSW has met with pt. Pt will dc to [facility] with hospice following.  Resources given to pt and substance abuse assessment completed with pt.  Full assessment to follow.  Theresia Bough, MSW, Theresia Majors [phone]

## 2012-08-09 NOTE — Progress Notes (Signed)
Clinical Social Work Department BRIEF PSYCHOSOCIAL ASSESSMENT 08/09/2012  Patient:  [name], [name]     Account Number:  [id number]     Admit date:  08/04/2012  Clinical Social Worker:  Lourdes Sledge  Date/Time:  08/09/2012 03:08 PM  Referred by:  Physician  Date Referred:  08/09/2012 Referred for  Homelessness  Substance Abuse   Other Referral:   Interview type:  Patient Other interview type:    PSYCHOSOCIAL DATA Living Status:  OTHER Admitted from facility:   Level of care:   Primary support name:   Primary support relationship to patient:   Degree of support available:   Pt states he is no longer married however has a supportive girlfriend. Name of girlfriend was not provided.    CURRENT CONCERNS Current Concerns  Post-Acute Placement   Other Concerns:    SOCIAL WORK ASSESSMENT / PLAN CSW informed that pt is homeless and has 4 children.    CSW reviewed pt notes and found that pt uds came back positive for cocaine. 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    CSW visited pt room to explore pt housing situation. Pt confirmed he lives at [facility] with his 3 children (4, 8, 11). Pt confirmed his children are safe at the shelter with another family. Pt stated he has custody of his children whome he takes care of. Pt became agitated with CSW as CSW explored pt drug history and whether he uses infront of the children. Pt stated he would never use infront of the children and does not feel like he has a problem with cocaine. Pt states when he does use it is due to feeling depressed due to not having stable housing for he and his kids. CSW informd pt of concern regarding his use and how this could affect his children. Pt stated DSS has never been involved and there should be no concerns with his childrens wellbeing. CSW provided active listening as pt expressed how much support pt receives at the shelter and has  services available to him if he desires counseling. Pt did confirm he has a psychiatrist at [facility], Dr. Clyde Lundborg.    Pt confirmed he and his children have access to food and shelter and have no additional concerns. Transportation to be provided to pt by his Architect. CSW did provide resources for housing for pt, pt very appreciative.    CSW staffed case with supervisor. CSW to make a CPS report.   Assessment/plan status:  No Further Intervention Required Other assessment/ plan:   Information/referral to community resources:   CSW offered to give pt resources for substance abuse/counseling however pt declined. Pt did accept resources for affourdable housing.    PATIENT'S/FAMILY'S RESPONSE TO PLAN OF CARE: Pt laying in bed alert and oriented however very short tempered. CSW at one pt asked pt whether he was interested in resources or preferred for CSW to leave. Pt became less agitated and thanked CSW for resources.    Pt to obtain transportation from his Sport and exercise psychologist. No additional needs, CSW signed.       Theresia Bough, MSW, Theresia Majors [phone]

## 2012-08-09 NOTE — Progress Notes (Signed)
Notified by Steward Drone CMRN and Lorinda Creed NP patient requests services of [facility]) at discharge. [facility] notified of referral and informed that patient was a previous [facility] patient discharged with improved condition November 2013; as patient would be going into the third Hospice Medicare Benefit period a face to face visit by the hospice physician is required to determine eligibility.  Per Steward Drone [facility] patient will be picked up at 3:00 pm by Kathlene November, a [facility] staff person who will provide transportation back to the shelter today. [facility] Medical Director, Dr Mikle Bosworth Monguilod will be able to see patient today to evaluate for eligibility, prior to his discharge from the hospital. Spoke with Mr Canterbury at bedside who is aware of above and agreeable to meeting.   Valente David, RN 08/09/2012, 12:40 PM [facility] RN Liaison [phone]

## 2012-08-09 NOTE — Progress Notes (Signed)
Patient ID: [name], male   DOB: Apr 29, 1965, 47 y.o.   MRN: [id number]    SUBJECTIVE:   Placed on Milrinone at 0.25 mcg and lasix drip at 10 mg per hour. Yesterday Milrinone stopped and oral diuretics restarted.    Weight up 3 pounds but still within his baseline. (205-208 pounds) No weight available. 24hour I/O -3 liters.  +UDS cocaine.   Denies SOB/PND/Orthopnea.      Marland Kitchen aspirin EC  81 mg Oral Daily  . atorvastatin  80 mg Oral q1800  . clopidogrel  75 mg Oral Q breakfast  . digoxin  0.125 mg Oral Daily  . heparin subcutaneous  5,000 Units Subcutaneous Q8H  . hydrALAZINE  25 mg Oral Q8H  . insulin aspart  0-9 Units Subcutaneous TID WC  . isosorbide mononitrate  60 mg Oral Daily  . metolazone  2.5 mg Oral Q Wed,Sat  . mirtazapine  30 mg Oral QHS  . pantoprazole  40 mg Oral Daily  . sodium chloride  10-40 mL Intracatheter Q12H  . torsemide  80 mg Oral Daily    Filed Vitals:   08/09/12 0436 08/09/12 0454 08/09/12 0814 08/09/12 1032  BP:  103/61 114/81   Pulse:  85  78  Temp:  97.3 F (36.3 C)    TempSrc:  Oral    Resp:  18    Height:      Weight: 94.711 kg (208 lb 12.8 oz)     SpO2:  100%      Intake/Output Summary (Last 24 hours) at 08/09/12 1344 Last data filed at 08/09/12 1300  Gross per 24 hour  Intake      0 ml  Output   3250 ml  Net  -3250 ml    LABS: Basic Metabolic Panel:  Recent Labs  62/13/08 0500 08/08/12 0540 08/09/12 0433  NA 132* 134* 134*  K 4.0 4.4 4.2  CL 95* 98 98  CO2 28 26 26   GLUCOSE 137* 142* 116*  BUN 40* 37* 37*  CREATININE 2.12* 1.86* 1.96*  CALCIUM 9.1 9.3 8.9  MG 2.1 2.2  --    Liver Function Tests: No results found for this basename: AST, ALT, ALKPHOS, BILITOT, PROT, ALBUMIN,  in the last 72 hours No results found for this basename: LIPASE, AMYLASE,  in the last 72 hours CBC: No results found for this basename: WBC, NEUTROABS, HGB, HCT, MCV, PLT,  in the last 72 hours Cardiac Enzymes: No results found for this  basename: CKTOTAL, CKMB, CKMBINDEX, TROPONINI,  in the last 72 hours BNP: No components found with this basename: POCBNP,  D-Dimer: No results found for this basename: DDIMER,  in the last 72 hours Hemoglobin A1C: No results found for this basename: HGBA1C,  in the last 72 hours Fasting Lipid Panel: No results found for this basename: CHOL, HDL, LDLCALC, TRIG, CHOLHDL, LDLDIRECT,  in the last 72 hours Thyroid Function Tests: No results found for this basename: TSH, T4TOTAL, FREET3, T3FREE, THYROIDAB,  in the last 72 hours Anemia Panel: No results found for this basename: VITAMINB12, FOLATE, FERRITIN, TIBC, IRON, RETICCTPCT,  in the last 72 hours  RADIOLOGY: Dg Chest 2 View  08/04/2012   *RADIOLOGY REPORT*  Clinical Data: Chest pain,  shortness of breath and weakness.  CHEST - 2 VIEW  Comparison: 07/15/2012.  Findings: Single lead pacer good position from left-sided approach. Cardiomegaly.  Mild vascular congestion.  Negative osseous structures.  No effusion or pneumothorax.  Improved aeration from priors.  IMPRESSION:  Cardiomegaly with mild vascular congestion.  Improved congestive heart failure.   Original Report Authenticated By: Davonna Belling, M.D.   Dg Chest Portable 1 View  07/15/2012   *RADIOLOGY REPORT*  Clinical Data: Chest pain.  PORTABLE CHEST - 1 VIEW  Comparison: 06/16/2012.  Findings: The heart is mildly enlarged.  There is a right ventricular ICD pacer wire with left-sided generator.  The mediastinal and hilar contours are normal.  There is mild, generalized diffuse interstitial thickening.  No alveolar disease. No pneumothoraces or evident effusions.  IMPRESSION: Cardiomegaly and interstitial thickening consistent with congestive heart failure.   Original Report Authenticated By: Sander Radon, M.D.    PHYSICAL EXAM - CVP 6 personally checked General: NAD resting in bed  Neck: JVP flat no thyromegaly or thyroid nodule.  Lungs: Clear  CV: Lateral PMI.  Heart regular S1/S2, no  S3/S4, no murmur.  No peripheral edema.   Abdomen: Soft, nontender, no hepatosplenomegaly, no distention.  Neurologic: Alert and oriented x 3.  Psych: Normal affect. Extremities: No clubbing or cyanosis.  Cool. RUE PICC  TELEMETRY: Reviewed telemetry pt in NSR with 1 run of 8 beats NSVT  ASSESSMENT AND PLAN: 47 yo with history of nonischemic CMP, cocaine abuse, recent NSTEMI, and CKD with poor medication compliance returned to hospital with acute on chronic systolic CHF and chest pain.  1. Acute on chronic systolic CHF: Nonischemic cardiomyopathy with low output state on admission.   24 hour I/O -3.0 liters. Now at baseline weight  (205-208 pounds).  - Continue torsemide 80 mg daily with  metolazone twice a week. Creatinine mild bump 1.8>1.9  Will need repeat BMET next week.  - Continue low dose digoxin, digoxin level ok. No beta blocker due to ongoing cocaine use. + UDS this admit.  -Increase hydralazine to 37.5 mg tid, continue current Imdur.  -Re-educated on medication compliance 2. CAD: Thrombotic occlusion of distal OM1 noted on cath after recent presentation with NSTEMI (too small caliber for intervention). He was UDS + for cocaine at that time. Suspect he may have had a cocaine-triggered plaque rupture. There was no thrombus in the LV cavity on echo.  TnI was > 20 at that time, now TnI in 0.51-0.92 range.  This may be residual elevation in the face of CKD versus TnI elevation due to demand ischemia with volume overload.  Of note, he was cocaine positive again.  He will need to continue ASA, Plavix, and statin.   3. CKD:  Creatinine baseline 1.6-1.8.  Creatinine mild bump today. 1.8>1.9  4. NSVT:  Mg ok. He has ICD.  Not on Coreg with cocaine use.  NSVT run this morning.   5. Noncompliance 6. Poor long-term prognosis.  He is homeless, not particularly compliant with meds, and uses cocaine.   Palliative Care Consult appreciated. Will need HAG once discharged but Mr Judson is not ready to  turn off ICD.    Follow up in HF clinic next week. Appointment already scheduled.     HF Meds (He will not be on a beta blocker due to cocaine use)  Atorvastatin 80 mg daily  Aspirin 81 mg daily Digoxin 0.125 mg daily Hydralazine 37.5 mg tid Imdur 60 mg daily Torsemide 80 mg daily Metolazone 2.5 mg every Wed and Friday  [name] 08/09/2012 1:44 PM

## 2012-08-09 NOTE — Progress Notes (Signed)
S: went to evaluate patient having arm pain over PICC site. Pt has no other complaints of SOB, CP, arm weakness/numbness.   O:  Filed Vitals:   08/09/12 0053  BP:   Pulse: 82  Temp:   Resp: 17   General: resting in bed, NAD Cardiac: RRR, no rubs, murmurs or gallops Pulm: clear to auscultation bilaterally, moving normal volumes of air Abd: soft, nontender, nondistended, BS present Ext: warm and well perfused, no pedal edema, PICC on right upper arm only point tenderness over insertion site, no infiltration, no erythema, no palpable masses, no ecchymosis Neuro: alert and oriented X3, 5/5 strength in Upper extremities bilaterally  A/P: Pt doesn't appear to have underlying hematoma or developing cellulitis/phelbitis. PICC is flushing well. Pt vitals stable. Review of CXR shows good placement of PICC. -symptomatic management with current pain medications and heat/ice over PICC site -cont to monitor -nursing and patient informed of above plan and to not hesitate to call with more/new concerns  Christen Bame, MD PGY-2 Pgr: [phone]

## 2012-08-10 ENCOUNTER — Ambulatory Visit (HOSPITAL_COMMUNITY): Payer: Medicare Other

## 2012-08-10 ENCOUNTER — Telehealth: Payer: Self-pay | Admitting: Internal Medicine

## 2012-08-10 NOTE — Telephone Encounter (Signed)
They will follow him, call for orders as needed may work w/ hospice md to provide best care

## 2012-08-10 NOTE — Telephone Encounter (Signed)
Okay sounds good.  So hospice will follow him at the shelter?

## 2012-08-10 NOTE — Telephone Encounter (Signed)
No, you will remain his primary provider and work with hospice and the hospice md to provide care for the pt. No, he is going with his children to a family shelter, they (the shelter) will try to assist him in finding housing for his family.

## 2012-08-10 NOTE — Discharge Summary (Signed)
Agree with Dr. Hollace Kinnier discharge summary except that the BMI at discharge is inaccurate secondary to a height that was not entered correctly.

## 2012-08-10 NOTE — Telephone Encounter (Signed)
Called by hospice LPN Geronimo Running about this patient. He had his PICC line removed yesterday and he is having some pain and swelling at the site. She had already talked to hospice MD and will be initiating treatment with keflex and warm compresses. I advised her that this plan was acceptable. He still has good pulses distal to the PICC site and extremity is warm.   Made her aware that she can call back or that if problem worsens she should help the patient seek medical attention. She was in agreement and understanding with this plan.  KOLLAR, Lavert Matousek 10:00 PM 08/10/2012

## 2012-08-11 LAB — CULTURE, BLOOD (ROUTINE X 2): Culture: NO GROWTH

## 2012-08-16 ENCOUNTER — Ambulatory Visit (HOSPITAL_COMMUNITY): Payer: Medicare Other

## 2012-08-23 ENCOUNTER — Other Ambulatory Visit: Payer: Self-pay | Admitting: Internal Medicine

## 2012-08-23 ENCOUNTER — Telehealth: Payer: Self-pay | Admitting: *Deleted

## 2012-08-23 DIAGNOSIS — R05 Cough: Secondary | ICD-10-CM

## 2012-08-23 MED ORDER — GUAIFENESIN ER 600 MG PO TB12: 600.0000 mg | ORAL_TABLET | Freq: Two times a day (BID) | ORAL | Status: DC | PRN

## 2012-08-23 NOTE — Telephone Encounter (Signed)
Hospice nurse calls to say pt has a cough that seems to be getting worse, no noted edema, shortness of breath the only complaints pt has are pain and cough, she states o2 sats are stable. She was thinking possibly some mucinex may help. She also states that pt was given #30 oxy-ir 10mg   by hospice md last week due to his c/o pain, it is noted that dr Darci Needle also gave pt #45 oxy-ir 5mg  on 08/09/2012 Pt stated today to hospice nurse that he would need more pain meds because his 45 yr old daughter took the bottle and he cant find it, he did not relay the fact that he was given the oxy-ir by [facility] md nor that he has a pain contract. [place] Lions states hospice will no longer order pt pain meds She also states pt stated he had a cpap machine and lost it, he states he can not sleep well at night without it but he can sleep during the day fine. i do not see where pt had a cpap but i may have missed it, in his problem list it states he had a sleep study in 2008 and was given NON cpap methods of treatment If you agree he will need mucinex ordered

## 2012-08-23 NOTE — Telephone Encounter (Signed)
Dear [name],  My understanding was that hospice md takes over his pain medications and most of his immediate concerns.  I have no problem with the mucinex.  As for cpap, he is a new patient for me and thus if last sleep study was in 2008 he would likely need other study to be evaluated for osa, however, if he is indeed hospice i am not sure if this is something he would want done?

## 2012-08-24 ENCOUNTER — Telehealth: Payer: Self-pay | Admitting: *Deleted

## 2012-08-24 NOTE — Telephone Encounter (Signed)
Pt calls and states he needs a new cpap machine, lost his other one, cannot find in chart where he had one, hospice # [phone], call pt at [phone]

## 2012-08-25 NOTE — Telephone Encounter (Signed)
In order for pt's insurance to replace lost CPAP, they will need new order indicating when original CPAP was lost and severity of pt's OSA.  They may request copy of sleep study.  I can make the referral for a replacement once information is on the chart.  Thanks

## 2012-08-28 NOTE — Telephone Encounter (Signed)
[name] , the only sleep study i could find was done in 2008, and at that time he wasn't given a cpap, it states in the chart non cpap interventions used. i may havge overlooked something so let me know, thanks, h.

## 2012-08-29 ENCOUNTER — Other Ambulatory Visit: Payer: Self-pay | Admitting: Internal Medicine

## 2012-08-29 DIAGNOSIS — G4733 Obstructive sleep apnea (adult) (pediatric): Secondary | ICD-10-CM

## 2012-08-29 NOTE — Telephone Encounter (Signed)
Dr Virgina Organ - do you want to put in an order for a CPAP machine for this pt?

## 2012-08-29 NOTE — Telephone Encounter (Signed)
cpap order placed.  Thanks,  Dr q

## 2012-08-29 NOTE — Telephone Encounter (Addendum)
Sleep study from 2013 given to [facility] CSW. [facility] called and they did deliver a CPAP to pt through Hospice in 2013 but it was returned to [facility] ion 11/2011. Info given to [facility] and I am waiting for a call from SW with Hospice.  Message left with [place] Lions RN with Hospice for f/u on CPAP

## 2012-08-29 NOTE — Telephone Encounter (Signed)
I talked with [place] Lions, RN with Hospice. She states the pt's PCP will need to write the order for CPAP if needed. 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

## 2012-08-29 NOTE — Consult Note (Signed)
I have reviewed and discussed the care of this patient in detail with the nurse practitioner including pertinent patient records, physical exam findings and data. I agree with details of this encounter.  

## 2012-08-30 NOTE — Telephone Encounter (Signed)
Thank you for following up on this shana.  I was wondering what payer source would be as well when I discussed this with [employer], but she said maybe medicare? Also, perhaps OSA treatment with cpap is considered palliative? Not sure, but thanks for following up.

## 2012-08-30 NOTE — Telephone Encounter (Signed)
Order received.  Pt has used [facility] in the past for CPAP.  Hospice in agreement.  Pt utilizing [facility].  CSW faxed referral for CPAP to [facility], noted pt current housing: [address] [employer], [address].

## 2012-08-30 NOTE — Telephone Encounter (Signed)
CSW spoke with [place], Hospice RN.  Hospice will not cover cost of CPAP as it is not directly related to hospice dx.  Pt does not have another payor source on chart and pt is not able to private pay cost of CPAP.  [place] Lions, RN is meeting with [name] at this time and will discuss with pt regarding CPAP referral primary payor.

## 2012-08-30 NOTE — Telephone Encounter (Signed)
Could we see what [employer] can do?

## 2012-08-31 NOTE — Progress Notes (Signed)
Yes, I believe hospice rn was going to discuss with patient plan.

## 2012-08-31 NOTE — Progress Notes (Signed)
CSW received message from [facility] on 08/30/12 at 1238, Lyla Glassing [phone].  [facility] requesting written order for settings to provide to Respiratory Therapy.   [facility] does have copy of sleep study as it was faxed over with order for CPAP.  Payor source is outstanding.  CSW will sign off at this time as pt will need to discuss his plan of care with Hospice.

## 2012-08-31 NOTE — Telephone Encounter (Signed)
CSW received call from [facility], Lyla Glassing [phone].  [facility]/Medicare is declining payment for CPAP.  CSW placed call to Mr. Boyajian to discuss alternatives, paying privately.  Pt voiced frustration, stating he needs the CPAP to get some rest and insurance covered this item in the past.  Pt states he has Medicare and Medicaid.  CSW informed Mr. Eden, [facility] does not have medicaid information on the chart.  But, if pt provided CSW with Medicaid number, could request [facility] to submit to [facility] for payment.  Pt's response "so, y'all gonna let me die.  I could die in my sleep."  CSW informed Mr. Mcneal, [facility] has completed the order and [facility] is in receipt of physicians order.  CSW offered number to [facility] for pt's to discuss payment/payor source directly with [facility].  Pt states he already has the phone number to [facility].

## 2012-09-04 ENCOUNTER — Telehealth: Payer: Self-pay | Admitting: Licensed Clinical Social Worker

## 2012-09-04 NOTE — Telephone Encounter (Signed)
Can we not just add the settings on the written order??

## 2012-09-04 NOTE — Telephone Encounter (Signed)
CSW recv'd message from [facility]. Pt requesting CPAP and O2 services.  [facility] requesting order.  CSW returned call to [facility], informed [facility] CPAP order was faxed along with sleep study.  [facility] has completed referral for CPAP.  PCP indicating setting should be pulled from sleep study results.  CSW informed [facility] was unaware of requesting for Oxygen.  Will forward to PCP to address during [facility] appt on 09/05/2012.

## 2012-09-04 NOTE — Addendum Note (Signed)
Addended by: Neomia Dear on: 09/04/2012 07:03 PM   Modules accepted: Orders

## 2012-09-04 NOTE — Telephone Encounter (Signed)
Interesting. The sleep study is barely a year old.  I will have to discuss this with the attendings tomorrow.

## 2012-09-04 NOTE — Telephone Encounter (Signed)
Dr. Virgina Organ - Yes.  DME agencies always requesting the setting to be written on the CPAP order. CSW recv'd call from [place] at [facility].  In order for CPAP to be submitted to Medicare for payment, pt will need to restart the process with physician visit and sleep study.  CSW informed Boneta Lucks, [facility], will forward information to PCP as pt has scheduled visit tomorrow.

## 2012-09-05 ENCOUNTER — Ambulatory Visit (INDEPENDENT_AMBULATORY_CARE_PROVIDER_SITE_OTHER): Payer: Medicare Other | Admitting: Internal Medicine

## 2012-09-05 ENCOUNTER — Encounter: Payer: Self-pay | Admitting: Internal Medicine

## 2012-09-05 VITALS — BP 108/81 | HR 96 | Temp 97.0°F | Wt 225.3 lb

## 2012-09-05 DIAGNOSIS — N058 Unspecified nephritic syndrome with other morphologic changes: Secondary | ICD-10-CM

## 2012-09-05 DIAGNOSIS — I509 Heart failure, unspecified: Secondary | ICD-10-CM

## 2012-09-05 DIAGNOSIS — E1129 Type 2 diabetes mellitus with other diabetic kidney complication: Secondary | ICD-10-CM

## 2012-09-05 DIAGNOSIS — I5022 Chronic systolic (congestive) heart failure: Secondary | ICD-10-CM

## 2012-09-05 DIAGNOSIS — I10 Essential (primary) hypertension: Secondary | ICD-10-CM

## 2012-09-05 DIAGNOSIS — F1410 Cocaine abuse, uncomplicated: Secondary | ICD-10-CM

## 2012-09-05 DIAGNOSIS — G4733 Obstructive sleep apnea (adult) (pediatric): Secondary | ICD-10-CM

## 2012-09-05 MED ORDER — TORSEMIDE 20 MG PO TABS: 120.0000 mg | ORAL_TABLET | Freq: Every day | ORAL | Status: DC

## 2012-09-05 NOTE — Assessment & Plan Note (Signed)
Lab Results  Component Value Date   HGBA1C 7.9* 07/15/2012   HGBA1C 7.3 05/10/2012   HGBA1C 5.4 12/27/2011    Assessment: Diabetes control: fair control Progress toward A1C goal:  unable to assess Comments: did not bring meter today but reports adherence to medications  Plan: Medications:  continue current medications Lantus 10 units qhs Home glucose monitoring: Frequency: 3 times a day Timing: before meals Instruction/counseling given: reminded to bring blood glucose meter & log to each visit and reminded to bring medications to each visit Educational resources provided: brochure Self management tools provided: home glucose logbook

## 2012-09-05 NOTE — Assessment & Plan Note (Signed)
Has been on CPAP in the past but due to frequent moves currently does not have it but wishes to restart.  Per [facility], needs to have study re-done.  -cpap titrate study ordered today -f/u hospice and [facility]
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
## 2012-09-05 NOTE — Assessment & Plan Note (Signed)
BP Readings from Last 3 Encounters:  09/05/12 108/81  08/09/12 114/72  08/03/12 110/78   Lab Results  Component Value Date   NA 134* 08/09/2012   K 4.2 08/09/2012   CREATININE 1.96* 08/09/2012   Assessment: Blood pressure control: controlled Progress toward BP goal:  at goal  Plan: Medications:  continue current medications digoxin, hydralazine, IMDUR, metolazone, and INCREASE torsemide to 120mg  qd today until follow  Educational resources provided: brochure Self management tools provided: home blood pressure logbook

## 2012-09-05 NOTE — Assessment & Plan Note (Signed)
Claims he has not used cocaine since recent hospital discharge and is trying to stay clean  -encouraged continued cessation

## 2012-09-05 NOTE — Assessment & Plan Note (Signed)
S/P recent hospital admission again for NSTEMI likely due to cocaine induced vasospasm vs demand ischemia 2/2 CHF exacerbation, he was discharged on adjustment of his medications including torsemide 80mg  daily with home hospice services as well.  Today presents with decompensated heart failure with volume overload and worsening DOE, SOB, and orthopnea.  Weight up ~20lbs from dry weight.  Weight today 225lb despite adherence to home meds.    -recommended admission to inpatient service at this time for acute on chronic systolic CHF but he REFUSED admission AGAINST MEDICAL ADVICE at this time. He says he has to take care of his kids and will return later. I explained the risks of refusing admission and his condition potentially worsening in detail including cardiopulmonary complications with respiratory distress, cardiac arrest, or even death.  He acknowledges these risks and continues to say he cannot stay until his kids are placed with someone safe. -i urged him to go to the ED as soon as possible if his condition worsens or does not improve -in the meantime, we will increase torsemide to 180mg  daily and he has CHF clinic follow up appointment tomorrow morning

## 2012-09-05 NOTE — Patient Instructions (Addendum)
YOU ARE REFUSING ADMISSION TO THE HOSPITAL TODAY AGAINST MEDICAL ADVICE.  WE HAVE REVIEWED THE RISKS AND POTENTIAL COMPLICATIONS OF REFUSING ADMISSION AT THIS TIME SUCH AS CARDIOPULMONARY COMPLICATIONS INCLUDING WORSENING SHORTNESS OF BREATH, RESPIRATORY DISTRESS, HEART ATTACK OR WORSENING FAILURE, AND EVEN DEATH.   In the meantime, please INCREASE your torsemide dose to 6 tablets daily, total 120mg  AND FOLLOW UP WITH YOUR HEART FAILURE CLINIC APPOINTMENT TOMORROW.    I HAVE ADVISED YOU TO GO TO THE EMERGENCY ROOM FOR FURTHER WORKUP AS YOUR CONDITION DOES NOT IMPROVE OR WORSENS.   We are working on your CPAP and sleep study appointments as soon as possible.   Again, you are refusing admission to the hospital for further care at this time, but please go to the emergency room if your condition worsens or does not improve right away.

## 2012-09-06 ENCOUNTER — Other Ambulatory Visit: Payer: Self-pay | Admitting: Internal Medicine

## 2012-09-06 ENCOUNTER — Ambulatory Visit (HOSPITAL_COMMUNITY)
Admission: RE | Admit: 2012-09-06 | Discharge: 2012-09-06 | Disposition: A | Discharge status: Home / Self Care | Source: Ambulatory Visit | Attending: Internal Medicine | Admitting: Internal Medicine

## 2012-09-06 VITALS — BP 102/58 | HR 96 | Wt 221.0 lb

## 2012-09-06 DIAGNOSIS — K254 Chronic or unspecified gastric ulcer with hemorrhage: Secondary | ICD-10-CM

## 2012-09-06 DIAGNOSIS — F329 Major depressive disorder, single episode, unspecified: Secondary | ICD-10-CM | POA: Insufficient documentation

## 2012-09-06 DIAGNOSIS — K279 Peptic ulcer, site unspecified, unspecified as acute or chronic, without hemorrhage or perforation: Secondary | ICD-10-CM | POA: Insufficient documentation

## 2012-09-06 DIAGNOSIS — R5381 Other malaise: Secondary | ICD-10-CM

## 2012-09-06 DIAGNOSIS — Z79899 Other long term (current) drug therapy: Secondary | ICD-10-CM | POA: Insufficient documentation

## 2012-09-06 DIAGNOSIS — G7000 Myasthenia gravis without (acute) exacerbation: Secondary | ICD-10-CM | POA: Insufficient documentation

## 2012-09-06 DIAGNOSIS — I5022 Chronic systolic (congestive) heart failure: Secondary | ICD-10-CM | POA: Insufficient documentation

## 2012-09-06 DIAGNOSIS — I2510 Atherosclerotic heart disease of native coronary artery without angina pectoris: Secondary | ICD-10-CM | POA: Insufficient documentation

## 2012-09-06 DIAGNOSIS — Z7902 Long term (current) use of antithrombotics/antiplatelets: Secondary | ICD-10-CM | POA: Insufficient documentation

## 2012-09-06 DIAGNOSIS — G4733 Obstructive sleep apnea (adult) (pediatric): Secondary | ICD-10-CM | POA: Insufficient documentation

## 2012-09-06 DIAGNOSIS — F1410 Cocaine abuse, uncomplicated: Secondary | ICD-10-CM | POA: Insufficient documentation

## 2012-09-06 DIAGNOSIS — R531 Weakness: Secondary | ICD-10-CM

## 2012-09-06 DIAGNOSIS — I428 Other cardiomyopathies: Secondary | ICD-10-CM | POA: Insufficient documentation

## 2012-09-06 DIAGNOSIS — F3289 Other specified depressive episodes: Secondary | ICD-10-CM | POA: Insufficient documentation

## 2012-09-06 DIAGNOSIS — I509 Heart failure, unspecified: Secondary | ICD-10-CM | POA: Insufficient documentation

## 2012-09-06 DIAGNOSIS — N183 Chronic kidney disease, stage 3 unspecified: Secondary | ICD-10-CM | POA: Insufficient documentation

## 2012-09-06 DIAGNOSIS — I252 Old myocardial infarction: Secondary | ICD-10-CM | POA: Insufficient documentation

## 2012-09-06 MED ORDER — POTASSIUM CHLORIDE CRYS ER 20 MEQ PO TBCR
40.0000 meq | EXTENDED_RELEASE_TABLET | Freq: Once | ORAL | Status: AC
  Administered 2012-09-06: 40 meq via ORAL

## 2012-09-06 MED ORDER — FUROSEMIDE 10 MG/ML IJ SOLN
40.0000 mg | Freq: Once | INTRAMUSCULAR | Status: AC
  Administered 2012-09-06: 40 mg via INTRAVENOUS

## 2012-09-06 MED ORDER — RANITIDINE HCL 75 MG PO TABS: 75.0000 mg | ORAL_TABLET | Freq: Every day | ORAL | Status: DC

## 2012-09-06 MED ORDER — FUROSEMIDE 10 MG/ML IJ SOLN
80.0000 mg | Freq: Once | INTRAMUSCULAR | Status: AC
  Administered 2012-09-06: 80 mg via INTRAVENOUS

## 2012-09-06 NOTE — Progress Notes (Signed)
22 gauge IV started in pt's right AC and 80 mg IV lasix given, pt tolerated well.

## 2012-09-06 NOTE — Progress Notes (Signed)
IV d/c'd catheter intact, pt aware to continue all home medications f/u Tue 8/26 at 9 am pt verbalizes understanding

## 2012-09-06 NOTE — Progress Notes (Signed)
Pt has urinated twice with 650cc out each time, per Dr Gala Romney 40 mg IV Lasix and 40 meq KCL PO were given, pt tolerated well

## 2012-09-06 NOTE — Telephone Encounter (Signed)
Any suggestions on how to help this pt?

## 2012-09-06 NOTE — Patient Instructions (Addendum)
Your physician recommends that you schedule a follow-up appointment in: 1 week   

## 2012-09-06 NOTE — Assessment & Plan Note (Signed)
Was on prilosec.  Noted to have drug interaction category x with plavix.  Discontinued prilosec and will try zantac at this time.  Discussed with Ms. Markgraf and he is in agreement.

## 2012-09-06 NOTE — Progress Notes (Signed)
Patient ID: [name], male   DOB: [birth year], 47 y.o.   MRN: [id number] Psychiatrist: Dr Anthony Sar PCP: Dr Loistine Chance  HPI: [name] is a 47 y/o male with HTN and DM2 with longstanding HF due to NICM (probably ETOH), EF 20%. Current cocaine abuse. Cath in 2008 showed left circumflex with 40% stenosis. Baseline weight about 210. Was previously on [facility] but this ended in December in 2013 as he was doing well.   Admitted 03/2011 with NYHA Class IV symptoms, requiring milrinone therapy and IV lasix. Cath 04/13/11: cardiogenic shock with biventricular failure (R>L) and possible restrictive physiology. RA = 22 with steep y-descents, RV = 45/12/25, PA = 51/25 (34), PCW = 18-20, Fick cardiac output/index = 3.1/1.4, Thermo CO/CI = 3.6/1.6, PVR = 4.8 Woods O2 sat = 85% (after sedation), PA sat = 31%, 36%. Milrinone was initiated for low output physiology.   Admitted to [facility] 06/2012 witih CP/elevated troponin and increased SOB. He had a +USD for cocaine. He also had LHC 07/17/12 showing subtotal thrombotic occlusion of the distal OM1. It was too small at this point for PCI and was started on plavix.  Echo showed EF 10% with no LV thrombus and moderately decreased RV systolic function.  His d/c weight was 206 and now 228 lbs, though this is lower than 230 which was his weight at last appointment in this office.  Admitted to [facility] 08/04/12 through 08/09/12 NSTEMI thought to be from non st elevation MI from cocaine induced vasospasm and volume overload. + cocaine. Dry weight 205-208 pounds. Discharge weight 208 pounds.   He returns for an acute work in due to volume overload. Yesterday he was seen by his PCP with recommendations for hospital admit due to volume overload however he declined. Today he complains of dyspnea with exertion and fatigue.  + Orthopnea. Denies PND. Taking all medications. Living in homeless shelter with his 3 children.   Labs  7/14: K 4.8, creatinine 2 08/09/12 Potassium 4.2 Creatinine  2.0  Spironolactone stopped due to hyperkalemia. Benazepril and lopressor stopped due to hypotension.   SH: Homeless, 2 children, lives in [place].  + cocaine abuse.  ROS: All systems negative except as listed in HPI, PMH and Problem List.  Past Medical History  Diagnosis Date  . Hypertension   . CHF (congestive heart failure)     a. 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 b. EF 20-25%, s/p St. Jude AICD placement 04/2007. c. Echo 06/2012: EF 10%.  . History of peptic ulcer disease     EGD 09/2007 shows prox gastric ulcer with black eschar, treated with PPI  . Depression   . History of alcohol abuse     quit approx 2011-2012  . OSA (obstructive sleep apnea)     Intolerant of CPAP  . ICD (implantable cardiac defibrillator) in place   . Diabetes mellitus     insulin dependent  . GERD (gastroesophageal reflux disease)   . Headache(784.0)   . Myasthenia gravis 10/12/2011    Diagnosed in 09/2011. Received Plasmapheresis. CT chest no Thymoma. Acetylcholine receptor antibody negative.    . CKD (chronic kidney disease) stage 3, GFR 30-59 ml/min   . Polysubstance abuse     Including cocaine (+ UDS multiple times in 2014)  . PUD (peptic ulcer disease)   . Pulmonary nodule     a. CT 09/2011.  Marland Kitchen CAD (coronary artery disease)     a. NSTEMI 06/2012: cath showed subtotal thrombotic occlusion of the  distal OM1, too small for PCI, ?cocaine-triggered plaque rupture with thrombosis (no LV thrombus on f/u echo).   - NSTEMI (6/14) with subtotal thrombotic occlusion of a distal OM1, too small for intervention.  No LV thrombus on echo.   Current Outpatient Prescriptions  Medication Sig Dispense Refill  . clopidogrel (PLAVIX) 75 MG tablet Take 1 tablet (75 mg total) by mouth daily with breakfast.  30 tablet  11  . clorazepate (TRANXENE) 7.5 MG tablet Take 1 tablet (7.5 mg total) by mouth daily. Take three tablets by mouth three times a day (1 in the morning and 2 in the evening):  For anxiety  30 tablet  3  . digoxin (LANOXIN) 0.125 MG tablet Take 1 tablet (0.125 mg total) by mouth daily. For heart problems      . guaiFENesin (MUCINEX) 600 MG 12 hr tablet Take 1 tablet (600 mg total) by mouth 2 (two) times daily as needed for congestion.  20 tablet  0  . hydrALAZINE (APRESOLINE) 25 MG tablet Take 1.5 tablets (37.5 mg total) by mouth every 8 (eight) hours.  30 tablet  0  . insulin glargine (LANTUS) 100 UNIT/ML injection Inject 0.1 mLs (10 Units total) into the skin at bedtime.  10 mL  12  . isosorbide mononitrate (IMDUR) 60 MG 24 hr tablet Take 1 tablet (60 mg total) by mouth daily. For chronic chest pains      . metolazone (ZAROXOLYN) 2.5 MG tablet Take 1 tablet (2.5 mg total) by mouth every Wednesday and Saturday.  8 tablet  0  . oxycodone (OXY-IR) 5 MG capsule Take 1 capsule (5 mg total) by mouth every 6 (six) hours as needed.  45 capsule  0  . potassium chloride (K-DUR) 10 MEQ tablet Take 1 tablet (10 mEq total) by mouth daily. For low potassium      . rosuvastatin (CRESTOR) 10 MG tablet Take 1 tablet (10 mg total) by mouth daily. For control of high cholesterol      . torsemide (DEMADEX) 20 MG tablet Take 6 tablets (120 mg total) by mouth daily. For control of high blood pressure/sewllings.      . hydrOXYzine (ATARAX/VISTARIL) 25 MG tablet Take 1-2 tablets (25-50 mg total) by mouth 3 (three) times daily as needed for itching or anxiety. Take 1 or 2 tablets by mouth 3 times daily as needed for itching.(Max of 6 tablets a day): For anxiety/itching  30 tablet  0  . ranitidine (ZANTAC) 75 MG tablet Take 1 tablet (75 mg total) by mouth at bedtime.  30 tablet  11  . [DISCONTINUED] metoprolol tartrate (LOPRESSOR) 25 MG tablet Take 1 tablet (25 mg total) by mouth 2 (two) times daily.  60 tablet  1  . [DISCONTINUED] traZODone (DESYREL) 25 mg TABS Take 0.5 tablets (25 mg total) by mouth at bedtime.  30 tablet  1   No current facility-administered medications for this encounter.      PHYSICAL EXAM: Filed Vitals:   09/06/12 0907  BP: 102/58  Pulse: 96  Weight: 221 lb (100.245 kg)  SpO2: 100%    General:  Chronically ill appearing.  No resp difficulty; very fatigued HEENT: normal Neck: supple. JVP to jaw. Carotids 2+ bilaterally; no bruits. No lymphadenopathy or thryomegaly appreciated. Cor: PMI normal. Regular rate & rhythm. 2/6 systolic murmur LLSB Lungs: clear Abdomen: obese, soft, nontender, mildly distended. No hepatosplenomegaly. No bruits or masses. Good bowel sounds. Extremities: no cyanosis, clubbing, rash, R and LLE 2+ edema Neuro: alert & orientedx3,  cranial nerves grossly intact. Moves all 4 extremities w/o difficulty. Affect pleasant.  ASSESSMENT & PLAN: 1. CHF: Chronic systolic CHF, nonischemic cardiomyopathy.  Possibly related to cocaine abuse. NYHA IIIB. Volume status elevated. Dry weight 208 pounds.  Given 120 mg IV lasix in HF clinic. Observed in HF clinice ~4 hours.  1300 cc urine output.  - Continue torsemide to 80 mg daily and metolazone 2.5 mg 30 minutes pre-torsemide on Mondays and Fridays .  Take KCl 40 mEq on metolazone days.  - - No ACEI /spiro with CKD, continue hydralazine/Imdur at current dose.  - Continue digoxin, will need to check level at follow up.  - No beta blocker due to current cocaine use.    2. CAD: Thrombotic occlusion of distal OM1 noted on cath after recent presentation with NSTEMI (too small caliber for intervention).   Continue Plavix for medical management of NSTEMI.  Need to make sure he is on ASA 81.  Continue statin.   3. Cocaine abuse:  Stressed the importance of abstinence from cocaine. He denies ongoing abuse however + UDS with July 2014 admit.  4. CKD: Creatinine has been stable, 1.9-2.  Follow up in 1 week check BMET and dig level. Paramedicine to follow.  CLEGG,AMY NP-C 09/06/2012

## 2012-09-07 NOTE — Progress Notes (Signed)
Case discussed with Dr. Virgina Organ at time of visit.  We reviewed the resident's history and exam.  I agree with the assessment, diagnosis, and plan of care documented in the resident's note.  He claims to be compliant with his outpatient heart failure medications, but clinically has recurrent decompensation in his heart failure (acute on chronic systolic heart failure).  He therefore appears to be failing his outpatient regimen.  We recommended admission to the hospital for IV diuresis and adjustment in his heart failure regimen.  He refused admission despite review of the risks and benefits.

## 2012-09-08 ENCOUNTER — Encounter: Payer: Self-pay | Admitting: Internal Medicine

## 2012-09-08 ENCOUNTER — Ambulatory Visit (INDEPENDENT_AMBULATORY_CARE_PROVIDER_SITE_OTHER): Payer: Medicare Other | Admitting: Internal Medicine

## 2012-09-08 VITALS — BP 107/75 | HR 89 | Temp 97.0°F | Ht 71.5 in | Wt 218.8 lb

## 2012-09-08 DIAGNOSIS — T7840XA Allergy, unspecified, initial encounter: Secondary | ICD-10-CM

## 2012-09-08 DIAGNOSIS — I509 Heart failure, unspecified: Secondary | ICD-10-CM

## 2012-09-08 DIAGNOSIS — G4733 Obstructive sleep apnea (adult) (pediatric): Secondary | ICD-10-CM

## 2012-09-08 DIAGNOSIS — R7309 Other abnormal glucose: Secondary | ICD-10-CM

## 2012-09-08 DIAGNOSIS — M549 Dorsalgia, unspecified: Secondary | ICD-10-CM

## 2012-09-08 DIAGNOSIS — L299 Pruritus, unspecified: Secondary | ICD-10-CM

## 2012-09-08 DIAGNOSIS — I5022 Chronic systolic (congestive) heart failure: Secondary | ICD-10-CM

## 2012-09-08 DIAGNOSIS — R739 Hyperglycemia, unspecified: Secondary | ICD-10-CM

## 2012-09-08 DIAGNOSIS — E785 Hyperlipidemia, unspecified: Secondary | ICD-10-CM

## 2012-09-08 DIAGNOSIS — G43909 Migraine, unspecified, not intractable, without status migrainosus: Secondary | ICD-10-CM

## 2012-09-08 LAB — GLUCOSE, CAPILLARY: Glucose-Capillary: 274 mg/dL — ABNORMAL HIGH (ref 70–99)

## 2012-09-08 MED ORDER — HYDROXYZINE HCL 25 MG PO TABS: 25.0000 mg | ORAL_TABLET | Freq: Three times a day (TID) | ORAL | Status: DC | PRN

## 2012-09-08 MED ORDER — HYDROCORTISONE 1 % EX CREA: TOPICAL_CREAM | Freq: Two times a day (BID) | CUTANEOUS | Status: DC

## 2012-09-08 MED ORDER — LIDOCAINE 5 % EX PTCH: 1.0000 | MEDICATED_PATCH | CUTANEOUS | Status: DC

## 2012-09-08 NOTE — Patient Instructions (Addendum)
-  You have an appointment made for the Headache Clinic.  -You also have an appointment for a repeat Sleep Study so you can get your CPAP machine.  -Use lidoderm patch for your back pain.  -Apply cortisone cream to the areas of itching/buring in your arms.  -You may take hydroxyzine for the itching but this medicine can make you sleepy.  -Follow up with the Heart Failure Clinic next week.  -You hospice number is [phone]. Please call them if you have any needs at home or medical concerns. You may also call us, the Internal Medicine Clinic at [phone] from M-Th from 8:AM until 5 PM and on Friday from 9:00AM until 5PM.

## 2012-09-09 DIAGNOSIS — Z91048 Other nonmedicinal substance allergy status: Secondary | ICD-10-CM | POA: Insufficient documentation

## 2012-09-09 NOTE — Assessment & Plan Note (Signed)
This could be secondary to ongoing cocaine use of even OSA with sleep deprivation.  Referred to Headache Clinic per pt's request.

## 2012-09-09 NOTE — Assessment & Plan Note (Addendum)
Skin peeling off in bilateral antecubital fossa with no discharge, bleeding, or surrounding erythema but itching.  Rx Vistaril 25mg  TID PRN (renally dosed) for itching. Pt has this medicine already and states that it puts him into a deep sleep. He will not take this med until he gets his CPAP machine.  Hydrocortisone cream 1% BID.

## 2012-09-09 NOTE — Progress Notes (Signed)
  Subjective:    Patient ID: [name], male    DOB: [birth year], 47 y.o.   MRN: [id number]  HPI [name] is a 47 year old man with complex PMH including NYHA IIIB HF, OSA, NSTEMI, CAD, chronic back pain, and cocaine use with UDS+ as recent as in July 2014 who comes in, accompanied by his minor daughter, for evaluation of bilateral forearm itching and skin peeling as well as chronic headache and migraine. He states that he is allergic to medical tape and had tape in his forearms last week after labs were drawn. His migraines are recurrent and he wants to be referred to the [facility]ache [facility]. His back pain is also recurrent, at its baseline with no paresthesias or loss of bowel of bladder control.  He is currently under hospice care with weekly RN visits but was told that his pain medications would be managed by his PCP, Dr. Virgina Organ. He notes that he is irritable lately, frustrated with his fragmented care by multiple providers including the [facility], hospice care, and the HF clinic. He recognizes though that he has been rude and irritable to many of his healthcare providers and thinks this is a result of chronic sleep depravation due to severe OSA. He is very interested in getting his CPAP machine as soon as possible so his sleep and mood will improve.    Review of Systems  Constitutional: Positive for fatigue. Negative for fever, chills, appetite change and unexpected weight change.  Respiratory: Positive for apnea and shortness of breath. Negative for cough, choking and wheezing.   Cardiovascular: Positive for leg swelling. Negative for chest pain and palpitations.  Gastrointestinal: Negative for abdominal distention.  Genitourinary: Negative for dysuria.  Musculoskeletal: Positive for back pain.       Chronic low back pain  Skin: Negative for color change, pallor, rash and wound.  Neurological: Positive for headaches. Negative for dizziness, syncope, speech difficulty and light-headedness.   Psychiatric/Behavioral: Positive for behavioral problems, sleep disturbance and agitation. Negative for suicidal ideas. The patient is nervous/anxious.        Daytime somnolence, labile mood with irritability but no violent behavior. Denies SI/HI.        Objective:   Physical Exam  Nursing note and vitals reviewed. Constitutional: He appears well-developed. No distress.  Tired appearing. Patient declines full physical exam including lung/heart auscultation and evaluation of his LE edema.    Eyes: Conjunctivae are normal. No scleral icterus.  Pulmonary/Chest: Effort normal.  Musculoskeletal:  Pretibial pitting edema bl   Skin: He is not diaphoretic.  Psychiatric:  Irritable, rude at times          Assessment & Plan:

## 2012-09-09 NOTE — Assessment & Plan Note (Signed)
Split night study has been ordered and pt was again made aware of his appointment date and time.

## 2012-09-09 NOTE — Assessment & Plan Note (Addendum)
Given his recent hx of cocaine use and unstable housing arrangement (staying at [employer]), will refrain from Rx of opioid tablets at this time. Pt was informed and irate stating that he is a new person and no longer using cocaine even though UDS as recent as 08/04/12 was positive for cocaine. Pt was Rx Oxycodone 5mg  #45 on 08/09/12 but is out of this medication now.  Rx Lidocaine 5%  24 patch for his back pain.  Pt states that he has no money to buy his medicines prescribed to him. Hospice services called and willing to pay for this Rx at this time.

## 2012-09-09 NOTE — Assessment & Plan Note (Addendum)
Pt with weight down to 218 lbs from 225 lbs since his last visit at the [facility]. He was seen that the HF clinic on 8/20 with tx of IV Lasix and increased diuresis. Pt declines physical exam during this visit but states that the swelling in his legs has gone down and his SOB is at its baseline. Pt states that he has overall muscle cramps secondary to fluid shifts. He declines BMET for K trending.  Pt to continue taking all his medications and follow up with the HF clinic.  Pt given contact number for [facility] 24h line.

## 2012-09-11 NOTE — Progress Notes (Signed)
Case discussed with Dr. Kennerly soon after the resident saw the patient.  We reviewed the resident's history and exam and pertinent patient test results.  I agree with the assessment, diagnosis, and plan of care documented in the resident's note. 

## 2012-09-12 ENCOUNTER — Encounter (HOSPITAL_COMMUNITY): Payer: Self-pay

## 2012-09-12 ENCOUNTER — Telehealth: Payer: Self-pay | Admitting: *Deleted

## 2012-09-12 ENCOUNTER — Ambulatory Visit (HOSPITAL_COMMUNITY)
Admission: RE | Admit: 2012-09-12 | Discharge: 2012-09-12 | Disposition: A | Discharge status: Home / Self Care | Source: Ambulatory Visit | Attending: Internal Medicine | Admitting: Internal Medicine

## 2012-09-12 VITALS — BP 104/78 | HR 94 | Wt 222.1 lb

## 2012-09-12 DIAGNOSIS — I10 Essential (primary) hypertension: Secondary | ICD-10-CM | POA: Insufficient documentation

## 2012-09-12 DIAGNOSIS — I509 Heart failure, unspecified: Secondary | ICD-10-CM | POA: Insufficient documentation

## 2012-09-12 DIAGNOSIS — E119 Type 2 diabetes mellitus without complications: Secondary | ICD-10-CM | POA: Insufficient documentation

## 2012-09-12 DIAGNOSIS — I2510 Atherosclerotic heart disease of native coronary artery without angina pectoris: Secondary | ICD-10-CM

## 2012-09-12 DIAGNOSIS — R748 Abnormal levels of other serum enzymes: Secondary | ICD-10-CM | POA: Insufficient documentation

## 2012-09-12 DIAGNOSIS — N189 Chronic kidney disease, unspecified: Secondary | ICD-10-CM

## 2012-09-12 DIAGNOSIS — I5022 Chronic systolic (congestive) heart failure: Secondary | ICD-10-CM | POA: Insufficient documentation

## 2012-09-12 DIAGNOSIS — F1410 Cocaine abuse, uncomplicated: Secondary | ICD-10-CM | POA: Insufficient documentation

## 2012-09-12 LAB — BASIC METABOLIC PANEL
CO2: 22 mEq/L (ref 19–32)
Chloride: 99 mEq/L (ref 96–112)
Creatinine, Ser: 1.61 mg/dL — ABNORMAL HIGH (ref 0.50–1.35)
Glucose, Bld: 306 mg/dL — ABNORMAL HIGH (ref 70–99)
Sodium: 135 mEq/L (ref 135–145)

## 2012-09-12 LAB — DIGOXIN LEVEL: Digoxin Level: 0.8 ng/mL (ref 0.8–2.0)

## 2012-09-12 MED ORDER — TORSEMIDE 20 MG PO TABS: 60.0000 mg | ORAL_TABLET | Freq: Two times a day (BID) | ORAL | Status: DC

## 2012-09-12 MED ORDER — ASPIRIN EC 81 MG PO TBEC: 81.0000 mg | DELAYED_RELEASE_TABLET | Freq: Every day | ORAL | Status: DC

## 2012-09-12 NOTE — Patient Instructions (Addendum)
Take 2.5 mg metolazone today along with an extra 20 meq potassium  Start ASA 81 mg daily.  Do the following things EVERYDAY: 1) Weigh yourself in the morning before breakfast. Write it down and keep it in a log. 2) Take your medicines as prescribed 3) Eat low salt foods-Limit salt (sodium) to 2000 mg per day.  4) Stay as active as you can everyday 5) Limit all fluids for the day to less than 2 liters 6)

## 2012-09-12 NOTE — Telephone Encounter (Signed)
No, we will not be prescribing narcotic pain medication. He is under the care of hospice as well and usually it is handled by them given the hospice care and diagnosis.   I have also copied Dr. Rogelia Boga on this note.

## 2012-09-12 NOTE — Telephone Encounter (Signed)
Will you be prescribing Oxycodone for this pt?

## 2012-09-12 NOTE — Telephone Encounter (Signed)
Pt was advised using his last visit with me that do to his ongoing substance abuse with UDS positive for cocaine as recent as last month, I would not prescribe him opioids (case discussed with Dr. Rogelia Boga during that visit). Please send request to his PCP for further consideration of his request. Thanks, SK

## 2012-09-12 NOTE — Telephone Encounter (Signed)
Call from [place], [place] with Hospice  _ # [phone] [name] called [facility] nurse stating he needs his oxycodone for pain, he wanted a Rx.  Hospice will not be giving pt any Opioids. They are referring this request back to [place].  Pt last seen in clinic on 8/22 and was given Lidoderm 5%  patch for pain. No Opioids.  Not sure what else I need to tell the Nurse.

## 2012-09-12 NOTE — Telephone Encounter (Signed)
Pt informed

## 2012-09-12 NOTE — Progress Notes (Signed)
Patient ID: [name], male   DOB: 11-14-1965, 47 y.o.   MRN: [id number] Psychiatrist: Dr Anthony Sar PCP: Dr Loistine Chance  HPI: [name] is a 46 y/o male with HTN and DM2 with longstanding HF due to NICM (probably ETOH), EF 20%. Current cocaine abuse. Cath in 2008 showed left circumflex with 40% stenosis. Baseline weight about 210. Was previously on [facility] but this ended in December in 2013 as he was doing well.   Admitted 03/2011 with NYHA Class IV symptoms, requiring milrinone therapy and IV lasix. Cath 04/13/11: cardiogenic shock with biventricular failure (R>L) and possible restrictive physiology. RA = 22 with steep y-descents, RV = 45/12/25, PA = 51/25 (34), PCW = 18-20, Fick cardiac output/index = 3.1/1.4, Thermo CO/CI = 3.6/1.6, PVR = 4.8 [place] O2 sat = 85% (after sedation), PA sat = 31%, 36%. Milrinone was initiated for low output physiology.   Admitted to [facility] 06/2012 witih CP/elevated troponin and increased SOB. He had a +USD for cocaine. He also had LHC 07/17/12 showing subtotal thrombotic occlusion of the distal OM1. It was too small at this point for PCI and was started on plavix.  Echo showed EF 10% with no LV thrombus and moderately decreased RV systolic function.  His d/c weight was 206 and now 228 lbs, though this is lower than 230 which was his weight at last appointment in this office.  Admitted to [facility] 08/04/12 through 08/09/12 NSTEMI thought to be from non st elevation MI from cocaine induced vasospasm and volume overload. + cocaine. Dry weight 205-208 pounds. Discharge weight 208 pounds.   Follow up: Last visit was given 120 mg IV lasix in clinic with 1300 cc out. Weight up 4 lbs, however patient reports he feels much better. Reports his appetite is back and he is eating a lot. Denies any SOB, orthopnea, PND or CP. Walked 2 miles yesterday with no SOB or having to stop. Taking all medications as prescribed. Does not have a scale at the homeless shelter to weigh himself. Moving into  apartment with children b/w 1st-15th. Signed up for scholarship with [employer] to start cardio.   Labs  7/14: K 4.8, creatinine 2 08/09/12 Potassium 4.2 Creatinine 2.0  Spironolactone stopped due to hyperkalemia. Benazepril and lopressor stopped due to hypotension.  SH: Homeless, 2 children, lives in [place].  + cocaine abuse.  ROS: All systems negative except as listed in HPI, PMH and Problem List.  Past Medical History  Diagnosis Date  . Hypertension   . CHF (congestive heart failure)     a. 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 b. EF 20-25%, s/p [employer] AICD placement 04/2007. c. Echo 06/2012: EF 10%.  . History of peptic ulcer disease     EGD 09/2007 shows prox gastric ulcer with black eschar, treated with PPI  . Depression   . History of alcohol abuse     quit approx 2011-2012  . OSA (obstructive sleep apnea)     Intolerant of CPAP  . ICD (implantable cardiac defibrillator) in place   . Diabetes mellitus     insulin dependent  . GERD (gastroesophageal reflux disease)   . Headache(784.0)   . Myasthenia gravis 10/12/2011    Diagnosed in 09/2011. Received Plasmapheresis. CT chest no Thymoma. Acetylcholine receptor antibody negative.    . CKD (chronic kidney disease) stage 3, GFR 30-59 ml/min   . Polysubstance abuse     Including cocaine (+ UDS multiple times in 2014)  . PUD (peptic ulcer  disease)   . Pulmonary nodule     a. CT 09/2011.  Marland Kitchen CAD (coronary artery disease)     a. NSTEMI 06/2012: cath showed subtotal thrombotic occlusion of the distal OM1, too small for PCI, ?cocaine-triggered plaque rupture with thrombosis (no LV thrombus on f/u echo).   - NSTEMI (6/14) with subtotal thrombotic occlusion of a distal OM1, too small for intervention.  No LV thrombus on echo.   Current Outpatient Prescriptions  Medication Sig Dispense Refill  . clopidogrel (PLAVIX) 75 MG tablet Take 1 tablet (75 mg total) by mouth daily with breakfast.  30 tablet  11  . clorazepate  (TRANXENE) 7.5 MG tablet Take 1 tablet (7.5 mg total) by mouth daily. Take three tablets by mouth three times a day (1 in the morning and 2 in the evening): For anxiety  30 tablet  3  . digoxin (LANOXIN) 0.125 MG tablet Take 1 tablet (0.125 mg total) by mouth daily. For heart problems      . guaiFENesin (MUCINEX) 600 MG 12 hr tablet Take 1 tablet (600 mg total) by mouth 2 (two) times daily as needed for congestion.  20 tablet  0  . hydrALAZINE (APRESOLINE) 25 MG tablet Take 1.5 tablets (37.5 mg total) by mouth every 8 (eight) hours.  30 tablet  0  . hydrocortisone cream 1 % Apply topically 2 (two) times daily.  30 g  0  . hydrOXYzine (ATARAX/VISTARIL) 25 MG tablet Take 1 tablet (25 mg total) by mouth 3 (three) times daily as needed for itching or anxiety. Take 1 or 2 tablets by mouth 3 times daily as needed for itching.(Max of 6 tablets a day): For anxiety/itching  30 tablet  0  . insulin glargine (LANTUS) 100 UNIT/ML injection Inject 0.1 mLs (10 Units total) into the skin at bedtime.  10 mL  12  . isosorbide mononitrate (IMDUR) 60 MG 24 hr tablet Take 1 tablet (60 mg total) by mouth daily. For chronic chest pains      . lidocaine (LIDODERM) 5 % Place 1 patch onto the skin daily. Remove & Discard patch within 24 hours  30 patch  0  . metolazone (ZAROXOLYN) 2.5 MG tablet Take 1 tablet (2.5 mg total) by mouth every Wednesday and Saturday.  8 tablet  0  . omeprazole (PRILOSEC) 20 MG capsule Take 20 mg by mouth daily.      Marland Kitchen oxycodone (OXY-IR) 5 MG capsule Take 1 capsule (5 mg total) by mouth every 6 (six) hours as needed.  45 capsule  0  . potassium chloride (K-DUR) 10 MEQ tablet Take 10 mEq by mouth daily. For low potassium, take extra tab on Wed and Sat when you take Metolazone      . ranitidine (ZANTAC) 75 MG tablet Take 1 tablet (75 mg total) by mouth at bedtime.  30 tablet  11  . rosuvastatin (CRESTOR) 10 MG tablet Take 1 tablet (10 mg total) by mouth daily. For control of high cholesterol      .  senna-docusate (SENNALAX-S) 8.6-50 MG per tablet Take 1 tablet by mouth daily.      Marland Kitchen torsemide (DEMADEX) 20 MG tablet Take 6 tablets (120 mg total) by mouth daily. For control of high blood pressure/sewllings.      . [DISCONTINUED] metoprolol tartrate (LOPRESSOR) 25 MG tablet Take 1 tablet (25 mg total) by mouth 2 (two) times daily.  60 tablet  1  . [DISCONTINUED] traZODone (DESYREL) 25 mg TABS Take 0.5 tablets (25 mg total)  by mouth at bedtime.  30 tablet  1   No current facility-administered medications for this encounter.   PHYSICAL EXAM: Filed Vitals:   09/12/12 0901  BP: 104/78  Pulse: 94  Weight: 222 lb 1.9 oz (100.753 kg)  SpO2: 100%   General:  Chronically ill appearing.  No resp difficulty;  HEENT: normal Neck: supple. JVP 8-9. Carotids 2+ bilaterally; no bruits. No lymphadenopathy or thryomegaly appreciated. Cor: PMI normal. Regular rate & rhythm. 2/6 systolic murmur LLSB Lungs: clear Abdomen: obese, soft, nontender, mildly distended. No hepatosplenomegaly. No bruits or masses. Good bowel sounds. Extremities: no cyanosis, clubbing, rash, no edema Neuro: alert & orientedx3, cranial nerves grossly intact. Moves all 4 extremities w/o difficulty. Affect pleasant.  ASSESSMENT & PLAN: 1. CHF: Chronic systolic CHF, nonischemic cardiomyopathy.  Possibly related to cocaine abuse. NYHA II symptoms. Volume status mildly elevated. Weight up 4 lbs since last visit, but he is eating more. Last weight was 208 pounds, however think this is probably increased to about 220 lbs.  - Will give 2.5 metolazone today and then continue  metolazone 2.5 mg 30 minutes pre-torsemide on Mondays and Fridays. Continue 120 mg torsemide daily, but have asked the patient to split into BID.  --Take KCl 40 mEq on metolazone days otherwise take 20 meq.  - - No ACEI /spiro with CKD, continue hydralazine/Imdur at current dose.  -- Continue digoxin, will get digoxin level today -- No beta blocker due to current  cocaine use.     2. CAD: Thrombotic occlusion of distal OM1 noted on cath after recent presentation with NSTEMI (too small caliber for intervention). Continue Plavix for medical management of NSTEMI.   -- start 81 mg ASA  3. Cocaine abuse:  Stressed the importance of abstinence from cocaine and other drugs. He reports he is no longer using and the new place he is moving into does drug tests on him regularly/  4. CKD: Creatinine has been stable, 1.9-2. - BMET today.  F/U 3 weeks    Aundria Rud NP-C 09/12/2012

## 2012-09-13 ENCOUNTER — Telehealth: Payer: Self-pay | Admitting: *Deleted

## 2012-09-13 NOTE — Telephone Encounter (Addendum)
I called  [facility] to see why they will not Rx controlled meds. The reason is that his pain is unrelated to his hospice qualifying dx and there has been concern about illicit substance abuse and therefore, they will not Rx opioids. She believes that he will likely graduate from hospice soon anyway since he is now able to walk 2 miles with IV lasix. If he were truly a hospice pt and likely would die in 6 months, we might overlook the illicit drug use and Rx opioids. But since he likely will be D/C'd from hospice, I do not think we should take on opioids as documented in his 13.

## 2012-09-13 NOTE — Telephone Encounter (Signed)
Call from [place], [facility] nurse - requesting clarification of med.  States pt has been taking Prilosec and recently changed to Zantac; wants to know if pt can stay on Prilosec. Talked to Dr Garald Braver, who saw the pt recently, since pt is on Plavix, he needs to be on Zantac. Hospice nurse informed and voiced understanding.

## 2012-09-13 NOTE — Addendum Note (Signed)
Addended by: Bufford Spikes on: 09/13/2012 09:28 AM   Modules accepted: Orders

## 2012-09-14 ENCOUNTER — Telehealth: Payer: Self-pay | Admitting: *Deleted

## 2012-09-14 NOTE — Telephone Encounter (Signed)
Mr. Terpstra called a few times to voice his concerns.  -Needs CPAP machine, order written and sleep study referral placed already for 9.10.14 -Wants to see his PCP, make request at scheduling and you will have to wait until opening, no guarantee. He can see another resident any time -Needs referral for headache pain, order written and headache clinic notified of need already. Not scheduled yet by specialist. I asked that he f/u on this in a couple weeks -Wants meds for pain, can not receive narcotics due to contract violation, results of headache referral could help with pain control or offer suggestions for treatment -To his knowledge has not received other meds for pain control for headaches and is open to trying something else. He has tried ibuprofen and can't take APAP  -I was told he has an arrangement with cardiologist to receive IV lasix and prevent hospitalization. He stressed he knows his lasix doses that work He understood and agreed to the plan. I stressed that I/we are all in his corner and trying to help. Cordial result.

## 2012-09-14 NOTE — Telephone Encounter (Signed)
I spoke to [name] in regards to this call this morning as well.  Social work will also try to arrange a care meeting with hospice as well.    Sleep study has been ordered on prior visits, scheduled for 09/27/12.  Perhaps we can try to get this moved up??. Has been referred to headache clinic in the past but I think there might be an issue of affordability.  Narcotic medications per Dr. Donnelly Stager note will not be given from [facility]. He should be scheduled to be seen in opc for his headache with another provider if his pain is very severe. Last CT head was 09/2011. Unfortunately, he cannot see the same provider each time.   Needs to continue to follow up with his appointments in heart failure clinic

## 2012-09-19 ENCOUNTER — Encounter: Payer: Self-pay | Admitting: Internal Medicine

## 2012-09-20 ENCOUNTER — Telehealth: Payer: Self-pay | Admitting: Internal Medicine

## 2012-09-20 NOTE — Telephone Encounter (Signed)
I called [name] in regards to discussing follow up CT chest needed based on prior imaging from 09/2011 showing nodule (4mm RLL). He did not answer and I left a voicemail for him to call back to discuss. If he would like to proceed with scan, we can place the order for follow up study.

## 2012-09-27 ENCOUNTER — Ambulatory Visit (HOSPITAL_BASED_OUTPATIENT_CLINIC_OR_DEPARTMENT_OTHER): Payer: Medicare Other | Attending: Internal Medicine | Admitting: Radiology

## 2012-09-27 VITALS — Ht 72.0 in | Wt 218.0 lb

## 2012-09-27 DIAGNOSIS — G4733 Obstructive sleep apnea (adult) (pediatric): Secondary | ICD-10-CM | POA: Insufficient documentation

## 2012-09-27 DIAGNOSIS — R0989 Other specified symptoms and signs involving the circulatory and respiratory systems: Secondary | ICD-10-CM | POA: Insufficient documentation

## 2012-09-27 DIAGNOSIS — R0609 Other forms of dyspnea: Secondary | ICD-10-CM | POA: Insufficient documentation

## 2012-09-27 NOTE — Addendum Note (Signed)
Addended by: Neomia Dear on: 09/27/2012 07:21 PM   Modules accepted: Orders

## 2012-09-30 DIAGNOSIS — G4733 Obstructive sleep apnea (adult) (pediatric): Secondary | ICD-10-CM

## 2012-10-01 NOTE — Procedures (Signed)
NAMEBERTRUM, HELMSTETTER                ACCOUNT NO.:  [id number]  MEDICAL RECORD NO.:  [id number]          PATIENT TYPE:  OUT  LOCATION:  SLEEP CENTER                 FACILITY:  [facility]  PHYSICIAN:  Clinton D. Maple Hudson, MD, FCCP, FACPDATE OF BIRTH:  1967-02-11  DATE OF STUDY:  09/27/2012                           NOCTURNAL POLYSOMNOGRAM  REFERRING PHYSICIAN:  SAMAYA QURESHI  INDICATION FOR STUDY:  Hypersomnia with sleep apnea.  EPWORTH SLEEPINESS SCORE:  18/24.  BMI 29.6, weight 218 pounds, height 72 inches, neck 16.5 inches.  MEDICATIONS:  Home medications are charted for review.  SLEEP ARCHITECTURE:  Split study protocol.  During the diagnostic phase, total sleep time 141 minutes with sleep efficiency 77%.  Stage I was 14.5%.  Stage II 73%, stage III absent, REM 12.4% of total sleep time. Sleep latency 21 minutes.  REM latency 94 minutes.  Awake after sleep onset 22.5 minutes.  Arousal index 28.5.  BEDTIME MEDICATION:  Hydroxyzine, Zantac.  RESPIRATORY DATA:  Split study protocol.  Apnea/hypopnea index (AHI) 46.8 per hour.  A total of 110 events was scored including 23 obstructive apneas, 58 central apneas, 8 mixed apneas, 21 hypopneas. Events were seen in all sleep positions, especially while supine.  REM AHI 13.7 per hour.  CPAP was titrated to 18 CWP, AHI 0 per hour. Bilevel was tried temporarily in an effort to clear residual central apneas, but he was returned to a CPAP setting of 18.  Medium Fisher and Paykel Simplus full face mask with heated humidifier.  OXYGEN DATA:  Moderately loud snoring before CPAP control with oxygen desaturation to a nadir of 81% on room air.  With CPAP control, snoring was prevented and mean oxygen saturation of 97.7% on room air.  CARDIAC DATA:  Sinus rhythm with PVCs.  MOVEMENT/PARASOMNIA:  No significant movement disturbance. Bathroom x1.  IMPRESSION/RECOMMENDATION: 1. Severe obstructive and central sleep apnea/hypopnea syndrome, AHI     of  46.8 per hour.  Events were more common while supine, but seen     in all sleep positions.  REM AHI 13.7.  Moderately loud snoring     with oxygen desaturation to a nadir of 81% on room air. 2. CPAP titration to 18 CWP, AHI 0 per hour.  He was tried temporarily     with bilevel in an effort to clear residual central apneas, which     were then seen again when he was returned to CPAP 18.  CPAP was not     expected to clear central apneas.  He wore a medium Fisher and     Paykel Simplus full-face mask with heated humidifier.  Snoring was     prevented and mean oxygen saturation held 97.7% on room air with     CPAP. 3. A baseline diagnostic NPSG on December 09, 2006, had recorded AHI     12.5 per hour.  Body weight was 257 pounds.  On May 18, 2011,     CPAP was titrated to 13 CWP.  Body weight was 238 pounds.     Clinton D. Maple Hudson, MD, FCCP, FACP Diplomate, American Board of Sleep Medicine    CDY/MEDQ  D:  09/30/2012 11:56:44  T:  10/01/2012 03:18:23  Job:  [id number]

## 2012-10-03 ENCOUNTER — Other Ambulatory Visit: Payer: Self-pay | Admitting: Internal Medicine

## 2012-10-03 DIAGNOSIS — G4733 Obstructive sleep apnea (adult) (pediatric): Secondary | ICD-10-CM

## 2012-10-04 ENCOUNTER — Telehealth: Payer: Self-pay | Admitting: *Deleted

## 2012-10-04 NOTE — Telephone Encounter (Signed)
CPAP order and pt's info placed in tray for [name] from [facility].

## 2012-10-05 ENCOUNTER — Telehealth: Payer: Self-pay | Admitting: Internal Medicine

## 2012-10-05 DIAGNOSIS — R911 Solitary pulmonary nodule: Secondary | ICD-10-CM

## 2012-10-05 NOTE — Telephone Encounter (Signed)
[name] will put note in chart that pt had missed several appts per [facility] and will need $100.00 up front.

## 2012-10-05 NOTE — Telephone Encounter (Signed)
I called [name] in regards to discussing following up CT chest for prior nodule on study. I reviewed we would order the CT and pending results may need further investigation.  He would like to proceed with CT at this time. He said he has to be at the hospital on Monday and would like to do both on the same day. Given his hx of smoking, we discussed possible malignancy and he says he understands and if that is the case he would want to pursue further work up and treatment as applicable.   He also inquired about his headache clinic referral. He says he has not heard back. I will ask RN to follow up on the status of his referral.  Additionally, he asked about his CPAP and I have informed him that the order has been placed again and RN is working with [facility] to get it set up hopefully soon.

## 2012-10-05 NOTE — Telephone Encounter (Signed)
CT has been scheduled for Monday 22; pt informed. Also [facility] referral given to [facility] to f/u since she had started this referral.

## 2012-10-09 ENCOUNTER — Ambulatory Visit (HOSPITAL_COMMUNITY)
Admission: RE | Admit: 2012-10-09 | Discharge: 2012-10-09 | Disposition: A | Discharge status: Home / Self Care | Payer: Medicare Other | Source: Ambulatory Visit | Attending: Internal Medicine | Admitting: Internal Medicine

## 2012-10-09 ENCOUNTER — Other Ambulatory Visit (HOSPITAL_COMMUNITY): Payer: Self-pay

## 2012-10-09 ENCOUNTER — Encounter (HOSPITAL_COMMUNITY): Payer: Self-pay

## 2012-10-09 VITALS — BP 96/60 | HR 99 | Wt 220.0 lb

## 2012-10-09 DIAGNOSIS — F1410 Cocaine abuse, uncomplicated: Secondary | ICD-10-CM | POA: Insufficient documentation

## 2012-10-09 DIAGNOSIS — N189 Chronic kidney disease, unspecified: Secondary | ICD-10-CM

## 2012-10-09 DIAGNOSIS — I5022 Chronic systolic (congestive) heart failure: Secondary | ICD-10-CM | POA: Insufficient documentation

## 2012-10-09 DIAGNOSIS — R911 Solitary pulmonary nodule: Secondary | ICD-10-CM

## 2012-10-09 DIAGNOSIS — I509 Heart failure, unspecified: Secondary | ICD-10-CM | POA: Insufficient documentation

## 2012-10-09 LAB — BASIC METABOLIC PANEL
CO2: 26 mEq/L (ref 19–32)
Chloride: 92 mEq/L — ABNORMAL LOW (ref 96–112)
Sodium: 132 mEq/L — ABNORMAL LOW (ref 135–145)

## 2012-10-09 LAB — PRO B NATRIURETIC PEPTIDE: Pro B Natriuretic peptide (BNP): 2424 pg/mL — ABNORMAL HIGH (ref 0–125)

## 2012-10-09 NOTE — Progress Notes (Signed)
Patient ID: [name], male   DOB: February 09, 1965, 47 y.o.   MRN: [id number] Psychiatrist: Dr Anthony Sar PCP: Dr Loistine Chance  HPI: [name] is a 47 y/o male with HTN and DM2 with longstanding HF due to NICM (probably ETOH), EF 20%. Current cocaine abuse. Cath in 2008 showed left circumflex with 40% stenosis. Baseline weight about 210. Was previously on [facility] but this ended in December in 2013 as he was doing well.   Admitted 03/2011 with NYHA Class IV symptoms, requiring milrinone therapy and IV lasix. Cath 04/13/11: cardiogenic shock with biventricular failure (R>L) and possible restrictive physiology. RA = 22 with steep y-descents, RV = 45/12/25, PA = 51/25 (34), PCW = 18-20, Fick cardiac output/index = 3.1/1.4, Thermo CO/CI = 3.6/1.6, PVR = 4.8 Woods O2 sat = 85% (after sedation), PA sat = 31%, 36%. Milrinone was initiated for low output physiology.   Admitted to [facility] 06/2012 witih CP/elevated troponin and increased SOB. He had a +USD for cocaine. He also had LHC 07/17/12 showing subtotal thrombotic occlusion of the distal OM1. It was too small at this point for PCI and was started on plavix.  Echo showed EF 10% with no LV thrombus and moderately decreased RV systolic function.  His d/c weight was 206 and now 228 lbs, though this is lower than 230 which was his weight at last appointment in this office.  Admitted to [facility] 08/04/12 through 08/09/12 NSTEMI thought to be from non st elevation MI from cocaine induced vasospasm and volume overload. + cocaine. Dry weight 205-208 pounds. Discharge weight 208 pounds.   Follow up: At last appointment, he was volume overloaded and metolazone twice weekly was added to his regimen.  Since then, he feels much better.  He is able to walk 1/2 - 1 mile without dyspnea now. No chest pain.  He has completely quit using cocaine but still smokes cigarettes rarely.  He has been having trouble with erectile dysfunction.   Labs  7/14: K 4.8, creatinine 2 08/09/12 Potassium 4.2  Creatinine 2.0 8/14: K 4, creatinine 1.6, digoxin 0.8  Spironolactone stopped due to hyperkalemia. Benazepril and metoprolol stopped due to hypotension.  SH: Homeless, 2 children, lives in [place].  + cocaine abuse until recently, says he has quit.  2-3 cigs/day  FH: No premature CAD.  ROS: All systems negative except as listed in HPI, PMH and Problem List.  Past Medical History  Diagnosis Date  . Hypertension   . CHF (congestive heart failure)     a. 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 b. EF 20-25%, s/p [employer] AICD placement 04/2007. c. Echo 06/2012: EF 10%.  . History of peptic ulcer disease     EGD 09/2007 shows prox gastric ulcer with black eschar, treated with PPI  . Depression   . History of alcohol abuse     quit approx 2011-2012  . OSA (obstructive sleep apnea)     Intolerant of CPAP  . ICD (implantable cardiac defibrillator) in place   . Diabetes mellitus     insulin dependent  . GERD (gastroesophageal reflux disease)   . Headache(784.0)   . Myasthenia gravis 10/12/2011    Diagnosed in 09/2011. Received Plasmapheresis. CT chest no Thymoma. Acetylcholine receptor antibody negative.    . CKD (chronic kidney disease) stage 3, GFR 30-59 ml/min   . Polysubstance abuse     Including cocaine (+ UDS multiple times in 2014)  . PUD (peptic ulcer disease)   . Pulmonary nodule  a. CT 09/2011.  Marland Kitchen CAD (coronary artery disease)     a. NSTEMI 06/2012: cath showed subtotal thrombotic occlusion of the distal OM1, too small for PCI, ?cocaine-triggered plaque rupture with thrombosis (no LV thrombus on f/u echo).   - NSTEMI (6/14) with subtotal thrombotic occlusion of a distal OM1, too small for intervention.  No LV thrombus on echo.   Current Outpatient Prescriptions  Medication Sig Dispense Refill  . aspirin EC 81 MG tablet Take 1 tablet (81 mg total) by mouth daily.  30 tablet  6  . clopidogrel (PLAVIX) 75 MG tablet Take 1 tablet (75 mg total) by mouth  daily with breakfast.  30 tablet  11  . clorazepate (TRANXENE) 7.5 MG tablet Take 1 tablet (7.5 mg total) by mouth daily. Take three tablets by mouth three times a day (1 in the morning and 2 in the evening): For anxiety  30 tablet  3  . digoxin (LANOXIN) 0.125 MG tablet Take 1 tablet (0.125 mg total) by mouth daily. For heart problems      . guaiFENesin (MUCINEX) 600 MG 12 hr tablet Take 1 tablet (600 mg total) by mouth 2 (two) times daily as needed for congestion.  20 tablet  0  . hydrALAZINE (APRESOLINE) 25 MG tablet Take 1.5 tablets (37.5 mg total) by mouth every 8 (eight) hours.  30 tablet  0  . hydrocortisone cream 1 % Apply topically 2 (two) times daily.  30 g  0  . hydrOXYzine (ATARAX/VISTARIL) 25 MG tablet Take 1 tablet (25 mg total) by mouth 3 (three) times daily as needed for itching or anxiety. Take 1 or 2 tablets by mouth 3 times daily as needed for itching.(Max of 6 tablets a day): For anxiety/itching  30 tablet  0  . insulin glargine (LANTUS) 100 UNIT/ML injection Inject 0.1 mLs (10 Units total) into the skin at bedtime.  10 mL  12  . isosorbide mononitrate (IMDUR) 60 MG 24 hr tablet Take 1 tablet (60 mg total) by mouth daily. For chronic chest pains      . lidocaine (LIDODERM) 5 % Place 1 patch onto the skin daily. Remove & Discard patch within 24 hours  30 patch  0  . metolazone (ZAROXOLYN) 2.5 MG tablet Take 1 tablet (2.5 mg total) by mouth every Wednesday and Saturday.  8 tablet  0  . omeprazole (PRILOSEC) 20 MG capsule Take 20 mg by mouth daily.      Marland Kitchen oxycodone (OXY-IR) 5 MG capsule Take 1 capsule (5 mg total) by mouth every 6 (six) hours as needed.  45 capsule  0  . potassium chloride (K-DUR) 10 MEQ tablet Take 10 mEq by mouth daily. For low potassium, take extra tab on Wed and Sat when you take Metolazone      . ranitidine (ZANTAC) 75 MG tablet Take 1 tablet (75 mg total) by mouth at bedtime.  30 tablet  11  . rosuvastatin (CRESTOR) 10 MG tablet Take 1 tablet (10 mg total) by  mouth daily. For control of high cholesterol      . senna-docusate (SENNALAX-S) 8.6-50 MG per tablet Take 1 tablet by mouth daily.      Marland Kitchen torsemide (DEMADEX) 20 MG tablet Take 3 tablets (60 mg total) by mouth 2 (two) times daily. For control of high blood pressure/sewllings.  180 tablet  3  . [DISCONTINUED] metoprolol tartrate (LOPRESSOR) 25 MG tablet Take 1 tablet (25 mg total) by mouth 2 (two) times daily.  60 tablet  1  . [DISCONTINUED] traZODone (DESYREL) 25 mg TABS Take 0.5 tablets (25 mg total) by mouth at bedtime.  30 tablet  1   No current facility-administered medications for this encounter.   PHYSICAL EXAM: Filed Vitals:   10/09/12 1026  BP: 96/60  Pulse: 99  Weight: 220 lb (99.791 kg)  SpO2: 100%   General:  Chronically ill appearing.  No resp difficulty;  HEENT: normal Neck: supple. JVP 8. Carotids 2+ bilaterally; no bruits. No lymphadenopathy or thryomegaly appreciated. Cor: PMI normal. Regular rate & rhythm. 2/6 systolic murmur LLSB Lungs: clear Abdomen: obese, soft, nontender, mildly distended. No hepatosplenomegaly. No bruits or masses. Good bowel sounds. Extremities: no cyanosis, clubbing, rash, no edema Neuro: alert & orientedx3, cranial nerves grossly intact. Moves all 4 extremities w/o difficulty. Affect pleasant.  ASSESSMENT & PLAN: 1. CHF: Chronic systolic CHF, nonischemic cardiomyopathy.  Possibly related to cocaine abuse and prior ETOH abuse. NYHA II symptoms. Volume status improved, weight is down 2 lbs.   - Continue torsemide 60 mg bid and metolazone 2.5 mg prior to torsemide twice weekly.  - Take KCl 40 mEq on metolazone days otherwise take 20 meq.  - No ACEI /spiro for now with CKD, continue hydralazine/Imdur at current dose.  - Continue digoxin, level ok in 8/14.  - He has not been on a beta blocker given cocaine abuse.  If he remains abstinent, we can add Coreg to his regimen.  - BNP today.   2. CAD: Thrombotic occlusion of distal OM1 noted on cath in  6/14 after recent presentation with NSTEMI (too small caliber for intervention). Possible cocaine-induced plaque rupture.  Continue Plavix and ASA 81 for medical management of NSTEMI.    3. Cocaine abuse:  Stressed the importance of abstinence from cocaine and other drugs. He reports he is no longer using.  4. CKD: BMET today.  5. Erectile dysfunction: He is not a candidate for Viagra-type medications (on Imdur, BP runs low at baseline).  He can see urology to look into other options.   6. Lung nodule: CT chest ordered by PCP to followup smalll nodule.   F/U 1 month    [name]  10/09/2012

## 2012-10-09 NOTE — Patient Instructions (Addendum)
Your physician recommends that you schedule a follow-up appointment in: 1 month  The number for [facility] is [phone]  Do the following things EVERYDAY: 1) Weigh yourself in the morning before breakfast. Write it down and keep it in a log. 2) Take your medicines as prescribed 3) Eat low salt foods-Limit salt (sodium) to 2000 mg per day.  4) Stay as active as you can everyday 5) Limit all fluids for the day to less than 2 liters 6)

## 2012-10-10 ENCOUNTER — Telehealth: Payer: Self-pay | Admitting: Internal Medicine

## 2012-10-10 ENCOUNTER — Encounter: Payer: Self-pay | Admitting: Internal Medicine

## 2012-10-10 NOTE — Telephone Encounter (Signed)
I called [name] this afternoon to review results of his recent follow up CT chest. Results show prior nodule no longer identified, however, no ill defined RLL nodule seen that will require 3 month follow up. Letter with results also sent to patient today.

## 2012-10-11 ENCOUNTER — Telehealth: Payer: Self-pay | Admitting: *Deleted

## 2012-10-11 ENCOUNTER — Telehealth (HOSPITAL_COMMUNITY): Payer: Self-pay | Admitting: *Deleted

## 2012-10-11 ENCOUNTER — Telehealth: Payer: Self-pay | Admitting: Internal Medicine

## 2012-10-11 MED ORDER — POTASSIUM CHLORIDE ER 10 MEQ PO TBCR: 20.0000 meq | EXTENDED_RELEASE_TABLET | Freq: Every day | ORAL | Status: DC

## 2012-10-11 NOTE — Telephone Encounter (Signed)
Call from Derrian, [facility] - pt has an apt Oct 3.

## 2012-10-11 NOTE — Telephone Encounter (Signed)
Message copied by Noralee Space on Wed Oct 11, 2012 11:09 AM ------      Message from: Laurey Morale      Created: Mon Oct 09, 2012  2:18 PM       BNP better.  Needs to add another KCl 20 mEq daily dose to the KCl he is already taking ------

## 2012-10-11 NOTE — Telephone Encounter (Signed)
I called [name] again this morning to review results of CT scan after he called triage. We discussed the new nodule finding and resolution of one seen on prior studies. I discussed we will need follow up study in December and he agreed. The results of CT scan have also been mailed to his home.

## 2012-10-11 NOTE — Telephone Encounter (Signed)
Pt had called about CPAP. I called [facility], she said he was called yesterday, no answer so they left a message for him to call [phone] ext [phone] ask for Respiratory. I talked to pt this morning - he said he called their number and asked to speak to Ef[place], left message but no one has called him back. I told pt to call [facility] again to set an appt. He agreed.

## 2012-10-11 NOTE — Telephone Encounter (Signed)
Pt calls and states he would like to know who called him yesterday, triage nurse looked at chart and informed pt that dr Virgina Organ had called to discuss results of radiology procedure, he was informed a message would be sent to dr Virgina Organ to call him again. He became angry stated "ya'll are jerking me around" he was informed that if she had not called him by 1330 she would be paged and ask to call him. He then stated he wanted the head man. i ask him to hold, paged dr Virgina Organ and informed her of the call. She stated she would call him in appr 1 hr. He was transferred to [facility]. To ask about cpap.

## 2012-10-16 ENCOUNTER — Telehealth: Payer: Self-pay | Admitting: *Deleted

## 2012-10-16 ENCOUNTER — Encounter (HOSPITAL_COMMUNITY): Payer: Self-pay | Admitting: Emergency Medicine

## 2012-10-16 ENCOUNTER — Emergency Department (HOSPITAL_COMMUNITY)
Admission: EM | Admit: 2012-10-16 | Discharge: 2012-10-16 | Disposition: A | Discharge status: Home / Self Care | Payer: Medicare Other | Attending: Emergency Medicine | Admitting: Emergency Medicine

## 2012-10-16 ENCOUNTER — Emergency Department (HOSPITAL_COMMUNITY): Payer: Medicare Other

## 2012-10-16 ENCOUNTER — Emergency Department (INDEPENDENT_AMBULATORY_CARE_PROVIDER_SITE_OTHER)
Admission: EM | Admit: 2012-10-16 | Discharge: 2012-10-16 | Disposition: A | Discharge status: Hospice | Payer: Medicare Other | Source: Home / Self Care | Attending: Emergency Medicine | Admitting: Emergency Medicine

## 2012-10-16 DIAGNOSIS — A499 Bacterial infection, unspecified: Secondary | ICD-10-CM

## 2012-10-16 DIAGNOSIS — Z7982 Long term (current) use of aspirin: Secondary | ICD-10-CM | POA: Insufficient documentation

## 2012-10-16 DIAGNOSIS — M7030 Other bursitis of elbow, unspecified elbow: Secondary | ICD-10-CM | POA: Diagnosis present

## 2012-10-16 DIAGNOSIS — Z79899 Other long term (current) drug therapy: Secondary | ICD-10-CM | POA: Insufficient documentation

## 2012-10-16 DIAGNOSIS — Z8711 Personal history of peptic ulcer disease: Secondary | ICD-10-CM | POA: Insufficient documentation

## 2012-10-16 DIAGNOSIS — F17200 Nicotine dependence, unspecified, uncomplicated: Secondary | ICD-10-CM | POA: Insufficient documentation

## 2012-10-16 DIAGNOSIS — I509 Heart failure, unspecified: Secondary | ICD-10-CM | POA: Insufficient documentation

## 2012-10-16 DIAGNOSIS — I2510 Atherosclerotic heart disease of native coronary artery without angina pectoris: Secondary | ICD-10-CM | POA: Insufficient documentation

## 2012-10-16 DIAGNOSIS — I129 Hypertensive chronic kidney disease with stage 1 through stage 4 chronic kidney disease, or unspecified chronic kidney disease: Secondary | ICD-10-CM | POA: Insufficient documentation

## 2012-10-16 DIAGNOSIS — N183 Chronic kidney disease, stage 3 unspecified: Secondary | ICD-10-CM | POA: Insufficient documentation

## 2012-10-16 DIAGNOSIS — Z95810 Presence of automatic (implantable) cardiac defibrillator: Secondary | ICD-10-CM | POA: Insufficient documentation

## 2012-10-16 DIAGNOSIS — F3289 Other specified depressive episodes: Secondary | ICD-10-CM | POA: Insufficient documentation

## 2012-10-16 DIAGNOSIS — E119 Type 2 diabetes mellitus without complications: Secondary | ICD-10-CM | POA: Insufficient documentation

## 2012-10-16 DIAGNOSIS — M71122 Other infective bursitis, left elbow: Secondary | ICD-10-CM

## 2012-10-16 DIAGNOSIS — M7022 Olecranon bursitis, left elbow: Secondary | ICD-10-CM

## 2012-10-16 DIAGNOSIS — F329 Major depressive disorder, single episode, unspecified: Secondary | ICD-10-CM | POA: Insufficient documentation

## 2012-10-16 DIAGNOSIS — R509 Fever, unspecified: Secondary | ICD-10-CM | POA: Insufficient documentation

## 2012-10-16 DIAGNOSIS — M7020 Olecranon bursitis, unspecified elbow: Secondary | ICD-10-CM

## 2012-10-16 DIAGNOSIS — IMO0002 Reserved for concepts with insufficient information to code with codable children: Secondary | ICD-10-CM | POA: Insufficient documentation

## 2012-10-16 DIAGNOSIS — K219 Gastro-esophageal reflux disease without esophagitis: Secondary | ICD-10-CM | POA: Insufficient documentation

## 2012-10-16 DIAGNOSIS — Z8669 Personal history of other diseases of the nervous system and sense organs: Secondary | ICD-10-CM | POA: Insufficient documentation

## 2012-10-16 DIAGNOSIS — Z794 Long term (current) use of insulin: Secondary | ICD-10-CM | POA: Insufficient documentation

## 2012-10-16 DIAGNOSIS — R110 Nausea: Secondary | ICD-10-CM | POA: Insufficient documentation

## 2012-10-16 LAB — BASIC METABOLIC PANEL
BUN: 38 mg/dL — ABNORMAL HIGH (ref 6–23)
CO2: 24 mEq/L (ref 19–32)
Chloride: 90 mEq/L — ABNORMAL LOW (ref 96–112)
GFR calc non Af Amer: 48 mL/min — ABNORMAL LOW (ref >90)
Glucose, Bld: 195 mg/dL — ABNORMAL HIGH (ref 70–99)
Potassium: 3.2 mEq/L — ABNORMAL LOW (ref 3.5–5.1)
Sodium: 130 mEq/L — ABNORMAL LOW (ref 135–145)

## 2012-10-16 LAB — CBC WITH DIFFERENTIAL/PLATELET
Hemoglobin: 15.1 g/dL (ref 13.0–17.0)
Lymphocytes Relative: 26 % (ref 12–46)
Lymphs Abs: 1.3 10*3/uL (ref 0.7–4.0)
MCH: 26.9 pg (ref 26.0–34.0)
Monocytes Relative: 6 % (ref 3–12)
Neutro Abs: 3.1 10*3/uL (ref 1.7–7.7)
Neutrophils Relative %: 63 % (ref 43–77)
Platelets: 193 10*3/uL (ref 150–400)
RBC: 5.62 MIL/uL (ref 4.22–5.81)
RDW: 14.9 % (ref 11.5–15.5)
WBC: 5 10*3/uL (ref 4.0–10.5)

## 2012-10-16 LAB — GRAM STAIN

## 2012-10-16 LAB — SYNOVIAL CELL COUNT + DIFF, W/ CRYSTALS
Crystals, Fluid: NONE SEEN
WBC, Synovial: 2192 /mm3 — ABNORMAL HIGH (ref 0–200)

## 2012-10-16 LAB — C-REACTIVE PROTEIN: CRP: 0.9 mg/dL — ABNORMAL HIGH (ref <0.60)

## 2012-10-16 LAB — SEDIMENTATION RATE: Sed Rate: 5 mm/hr (ref 0–16)

## 2012-10-16 MED ORDER — OXYCODONE HCL 5 MG PO TABS
5.0000 mg | ORAL_TABLET | ORAL | Status: AC
  Administered 2012-10-16: 5 mg via ORAL
  Filled 2012-10-16: qty 1

## 2012-10-16 MED ORDER — OXYCODONE HCL 5 MG PO TABS: 5.0000 mg | ORAL_TABLET | ORAL | Status: DC | PRN

## 2012-10-16 NOTE — ED Notes (Signed)
Spoke with phlebotomy they will come stick him next.

## 2012-10-16 NOTE — ED Notes (Signed)
Pt reports he was at urgent care earlier today and told to go to ED for further evaluation. Pt c/o left elbow pain, started last Saturday, reports the swelling has progressively gotten worse. sts he has been taking aspirin at home and thinks it helped with the swelling but hasn't relieved the pain. Pt sts that he has seen his heart doctor on Monday and said everything was good. Pt in nad, skin warm and dry, resp e/u. Pt sts he needs this to be a quick process so he can go pick his kids up. sts the urgent care physician think his elbow needs to be drained and possibly infected.

## 2012-10-16 NOTE — ED Notes (Signed)
MD at bedside drawing fluid off left elbow.

## 2012-10-16 NOTE — ED Notes (Signed)
Pt c/o left elbow pain with swelling x 9 days; pt sent from [facility] for further eval

## 2012-10-16 NOTE — ED Provider Notes (Signed)
Chief Complaint:   Chief Complaint  Patient presents with  . Joint Swelling    History of Present Illness:   [name] is a 47 year old male who has had a ten-day history of pain and swelling of the left olecranon bursa. He denies any trauma. Over the past 4-5 days she's had a temperature of up to 101, and swelling of the forearm as well. He's never had anything like this before. No numbness, tingling, weakness of the arm.  Review of Systems:  Other than noted above, the patient denies any of the following symptoms: Systemic:  No fevers, chills, sweats, or aches.  No fatigue or tiredness. Musculoskeletal:  No joint pain, arthritis, bursitis, swelling, back pain, or neck pain. Neurological:  No muscular weakness, paresthesias, headache, or trouble with speech or coordination.  No dizziness.  PMFSH:  Past medical history, family history, social history, meds, and allergies were reviewed.    Physical Exam:   Vital signs:  BP 117/84  Pulse 95  Temp(Src) 98.2 F (36.8 C) (Oral)  Resp 18  SpO2 99% Gen:  Alert and oriented times 3.  In no distress. Musculoskeletal: The olecranon bursa is swollen, red, warm, and tender to touch. There is also swelling, redness, and tenderness to touch extending down the forearm almost as far as the wrist. There is no swelling of the upper arm. Shoulder is normal as is the wrist.  Otherwise, all joints had a full a ROM with no swelling, bruising or deformity.  No edema, pulses full. Extremities were warm and pink.  Capillary refill was brisk.  Skin:  Clear, warm and dry.  No rash. Neuro:  Alert and oriented times 3.  Muscle strength was normal.  Sensation was intact to light touch.   Assessment:  The encounter diagnosis was Septic olecranon bursitis, left.  He has septic olecranon bursitis. Will need a dose of IV antibiotics.  Plan:   The patient was transferred to the ED via shuttle in stable condition.  Medical Decision Making:  47 year old male with  10 day history of pain and swelling of left olecranon bursa.  Now has fever, chills and swelling of entire arm.  Suggests septic olecranon bursitis.  On exam the olecranon is swollen and tender.  In addition he has swelling and tenderness of entire arm. Will need IV antibiotics.      Reuben Likes, MD 10/16/12 [phone]

## 2012-10-16 NOTE — ED Provider Notes (Addendum)
CSN: [id number]     Arrival date & time 10/16/12  1151 History   First MD Initiated Contact with Patient 10/16/12 [phone]     Chief Complaint  Patient presents with  . Elbow Pain   (Consider location/radiation/quality/duration/timing/severity/associated sxs/prior Treatment) Patient is a 47 y.o. male presenting with arm injury. The history is provided by the patient.  Arm Injury Upper extremity pain location: left elbow. Time since incident:  9 days Pain details:    Quality:  Aching   Radiates to:  Does not radiate   Severity:  Moderate   Onset quality:  Gradual   Duration:  9 days   Timing:  Constant   Progression:  Worsening Chronicity:  New Handedness:  Left-handed Dislocation: no   Foreign body present:  No foreign bodies Prior injury to area:  No Relieved by:  Nothing Worsened by:  Nothing tried Ineffective treatments:  None tried Associated symptoms: decreased range of motion and fever   Associated symptoms: no neck pain     Past Medical History  Diagnosis Date  . Hypertension   . CHF (congestive heart failure)     a. 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 b. EF 20-25%, s/p St. Jude AICD placement 04/2007. c. Echo 06/2012: EF 10%.  . History of peptic ulcer disease     EGD 09/2007 shows prox gastric ulcer with black eschar, treated with PPI  . Depression   . History of alcohol abuse     quit approx 2011-2012  . OSA (obstructive sleep apnea)     Intolerant of CPAP  . ICD (implantable cardiac defibrillator) in place   . Diabetes mellitus     insulin dependent  . GERD (gastroesophageal reflux disease)   . Headache(784.0)   . Myasthenia gravis 10/12/2011    Diagnosed in 09/2011. Received Plasmapheresis. CT chest no Thymoma. Acetylcholine receptor antibody negative.    . CKD (chronic kidney disease) stage 3, GFR 30-59 ml/min   . Polysubstance abuse     Including cocaine (+ UDS multiple times in 2014)  . PUD (peptic ulcer disease)   . Pulmonary  nodule     a. CT 09/2011.  Marland Kitchen CAD (coronary artery disease)     a. NSTEMI 06/2012: cath showed subtotal thrombotic occlusion of the distal OM1, too small for PCI, ?cocaine-triggered plaque rupture with thrombosis (no LV thrombus on f/u echo).    Past Surgical History  Procedure Laterality Date  . Cardiac defibrillator placement  04/2007  . Knee surgery    . Skin grafts    . Facial reconstruction surgery    . Insertion of dialysis catheter  10/12/2011    temporary    Family History  Problem Relation Age of Onset  . Heart failure Mother   . Hypertension Mother   . Sarcoidosis Mother   . Diabetes type II Mother   . Heart attack Brother    History  Substance Use Topics  . Smoking status: Current Some Day Smoker    Types: Cigars    Last Attempt to Quit: 07/27/2007  . Smokeless tobacco: Former Neurosurgeon     Comment: cigars only when stressed out  . Alcohol Use: No     Comment: no alcohol since 2012    Review of Systems  Constitutional: Positive for fever.  HENT: Negative for rhinorrhea, drooling and neck pain.   Eyes: Negative for pain.  Respiratory: Negative for cough and shortness of breath.   Cardiovascular: Negative for chest pain and leg swelling.  Gastrointestinal: Positive for nausea. Negative for vomiting, abdominal pain and diarrhea.  Genitourinary: Negative for dysuria and hematuria.  Musculoskeletal: Negative for gait problem.  Skin: Negative for color change.  Neurological: Negative for numbness and headaches.  Hematological: Negative for adenopathy.  Psychiatric/Behavioral: Negative for behavioral problems.  All other systems reviewed and are negative.    Allergies  Shellfish allergy; Adhesive; Tylenol; and Zoloft  Home Medications   Current Outpatient Rx  Name  Route  Sig  Dispense  Refill  . aspirin EC 81 MG tablet   Oral   Take 1 tablet (81 mg total) by mouth daily.   30 tablet   6   . clopidogrel (PLAVIX) 75 MG tablet   Oral   Take 1 tablet (75 mg  total) by mouth daily with breakfast.   30 tablet   11   . clorazepate (TRANXENE) 7.5 MG tablet   Oral   Take 1 tablet (7.5 mg total) by mouth daily. Take three tablets by mouth three times a day (1 in the morning and 2 in the evening): For anxiety   30 tablet   3   . dextromethorphan-guaiFENesin (MUCINEX DM) 30-600 MG per 12 hr tablet   Oral   Take 1 tablet by mouth every 12 (twelve) hours.         . digoxin (LANOXIN) 0.125 MG tablet   Oral   Take 1 tablet (0.125 mg total) by mouth daily. For heart problems         . hydrALAZINE (APRESOLINE) 25 MG tablet   Oral   Take 1.5 tablets (37.5 mg total) by mouth every 8 (eight) hours.   30 tablet   0   . hydrocortisone cream 1 %   Topical   Apply topically 2 (two) times daily.   30 g   0   . hydrOXYzine (ATARAX/VISTARIL) 25 MG tablet   Oral   Take 50 mg by mouth at bedtime. Take 1 or 2 tablets by mouth 3 times daily as needed for itching.(Max of 6 tablets a day): For anxiety/itching         . insulin glargine (LANTUS) 100 UNIT/ML injection   Subcutaneous   Inject 15 Units into the skin at bedtime.         . isosorbide mononitrate (IMDUR) 60 MG 24 hr tablet   Oral   Take 1 tablet (60 mg total) by mouth daily. For chronic chest pains         . omeprazole (PRILOSEC) 20 MG capsule   Oral   Take 20 mg by mouth daily.         . potassium chloride (K-DUR) 10 MEQ tablet   Oral   Take 2 tablets (20 mEq total) by mouth daily. For low potassium, take extra tab on Wed and Sat when you take Metolazone   70 tablet   6   . rosuvastatin (CRESTOR) 10 MG tablet   Oral   Take 1 tablet (10 mg total) by mouth daily. For control of high cholesterol         . torsemide (DEMADEX) 20 MG tablet   Oral   Take 120 mg by mouth daily with breakfast. For control of high blood pressure/sewllings.         . metolazone (ZAROXOLYN) 2.5 MG tablet   Oral   Take 1 tablet (2.5 mg total) by mouth every Wednesday and Saturday.   8  tablet   0    BP 113/87  Pulse 57  Temp(Src) 97.3 F (36.3 C)  Resp 16  Ht 6' (1.829 m)  Wt 218 lb (98.884 kg)  BMI 29.56 kg/m2  SpO2 100% Physical Exam  Nursing note and vitals reviewed. Constitutional: He is oriented to person, place, and time. He appears well-developed and well-nourished.  HENT:  Head: Normocephalic and atraumatic.  Right Ear: External ear normal.  Left Ear: External ear normal.  Nose: Nose normal.  Mouth/Throat: Oropharynx is clear and moist. No oropharyngeal exudate.  Eyes: Conjunctivae and EOM are normal. Pupils are equal, round, and reactive to light.  Neck: Normal range of motion. Neck supple.  Cardiovascular: Normal rate, regular rhythm, normal heart sounds and intact distal pulses.  Exam reveals no gallop and no friction rub.   No murmur heard. Pulmonary/Chest: Effort normal and breath sounds normal. No respiratory distress. He has no wheezes.  Abdominal: Soft. Bowel sounds are normal. He exhibits no distension. There is no tenderness. There is no rebound and no guarding.  Musculoskeletal: Normal range of motion. He exhibits no edema.  No obvious erythema of left arm. Mild, fluctuant swelling noted at posterior aspect of left elbow directly superior to the olecranon process. No other swelling of the left arm noted.   2+ distal pulses.  Normal rom of left elbow.   Neurological: He is alert and oriented to person, place, and time.  Skin: Skin is warm and dry.  Psychiatric: He has a normal mood and affect. His behavior is normal.    ED Course  ARTHOCENTESIS Date/Time: 10/16/2012 5:00 PM Performed by: Purvis Sheffield, S Authorized by: Purvis Sheffield, S Consent: Verbal consent obtained. written consent obtained. Risks and benefits: risks, benefits and alternatives were discussed Consent given by: patient Patient understanding: patient states understanding of the procedure being performed Patient consent: the patient's understanding of the  procedure matches consent given Procedure consent: procedure consent matches procedure scheduled Relevant documents: relevant documents present and verified Test results: test results available and properly labeled Site marked: the operative site was marked Imaging studies: imaging studies available Required items: required blood products, implants, devices, and special equipment available Patient identity confirmed: verbally with patient, arm band, provided demographic data and hospital-assigned identification number Time out: Immediately prior to procedure a "time out" was called to verify the correct patient, procedure, equipment, support staff and site/side marked as required. Indications: joint swelling  Body area: elbow Joint: left elbow Local anesthesia used: yes Anesthesia: local infiltration Local anesthetic: lidocaine 1% with epinephrine Anesthetic total: 2 ml Patient sedated: no Preparation: Patient was prepped and draped in the usual sterile fashion. Needle gauge: 18 G Approach: lateral Aspirate: blood-tinged Aspirate amount: 6 ml Patient tolerance: Patient tolerated the procedure well with no immediate complications. Comments: I initially attempted to aspirate synovial fluid from the elbow joint and was unsuccessful after 3 attempts. I then attempted to aspirate fluid from the olecranon bursa itself and was successful after 1 attempt. I removed approximately 6 ml of blood tinged fluid.    (including critical care time) Labs Review Labs Reviewed - No data to display Imaging Review Dg Elbow Complete Left  10/16/2012   CLINICAL DATA:  Left elbow pain  EXAM: LEFT ELBOW - COMPLETE 3+ VIEW  COMPARISON:  None.  FINDINGS: No acute fracture or dislocation is noted. Mild spurring from the coronoid process and olecranon is noted. Mild soft tissue swelling is seen which may be related to an underlying bursitis.  IMPRESSION: No acute bony abnormality is seen. There are changes  suggestive of an  underlying bursitis.   Electronically Signed   By: Alcide Clever   On: 10/16/2012 12:36    MDM   1. Bursitis of elbow, left    10/16/12 3:28 PM 47 y.o. male pw left elbow swelling and pain x 9 days. Fever of 101 noted 4 days ago [facility]. No fevers since then. Pt denies hx of gout or joint swelling in the past. He is AFVSS here, sent here by [facility]. Ddx includes abscess vs septic arthritis vs bursitis. Will get imaging, pain control, labs. I suspect that he has an olecranon bursitis based on clinical exam. The pt has no restriction of movement of his left elbow and his pain is localized to the ballotable swelling over the olecranon. Will perform arthrocentesis to confirm.    Pt insists that he needs to leave to pick his children up prior to the return of his lab results. While septic bursitis remains on the ddx, he has f/u at 10am in the morning w/ his pcp and has no systemic sx or abnormal VS on exam today. I will review his labwork when it returns and call him tomorrow.I suspect an olecranon bursitis. I have discussed the diagnosis/risks/treatment options with the patient and believe the pt to be eligible for discharge home to follow-up with his pcp as scheduled tomorrow. We also discussed returning to the ED immediately if new or worsening sx occur. We discussed the sx which are most concerning (e.g., fever, worsening pain, inc swelling, redness) that necessitate immediate return. Any new prescriptions provided to the patient are listed below.  Discharge Medication List as of 10/16/2012  6:26 PM    START taking these medications   Details  oxyCODONE (ROXICODONE) 5 MG immediate release tablet Take 1 tablet (5 mg total) by mouth every 4 (four) hours as needed for pain., Starting 10/16/2012, Until Discontinued, Print        10/17/12 1:11 PM I reviewed the pt's labs. Mildly elev wbc count in synovial fluid, gram stain neg. Cannot r/o septic bursitis w/ these results. I  called to speak with the pt who notes his pcp is having him f/u w/ a sports med doc. As he continues to be well, I think it is reasonable to cover with outpt abx. I notified him that I would call in a Rx for clindamycin to his pharmacy. I again gave him strong return precautions to return to the ED for any worsening. He understands.    Junius Argyle, MD 10/17/12 1318  Junius Argyle, MD 10/17/12 1321

## 2012-10-16 NOTE — ED Notes (Signed)
C/o left elbow swelling since last Saturday.  Denies injury.  States he did see heart pcp and was told it was fluid but no dose change in medications.

## 2012-10-16 NOTE — Telephone Encounter (Signed)
Pt calls c/o chest pain, left arm swollen, pain down left arm. Pt is ask to be seen immediately in ED or urg care, he is agreeable

## 2012-10-16 NOTE — ED Notes (Signed)
Pt given drink 

## 2012-10-17 ENCOUNTER — Ambulatory Visit (INDEPENDENT_AMBULATORY_CARE_PROVIDER_SITE_OTHER): Payer: Medicare Other | Admitting: Internal Medicine

## 2012-10-17 ENCOUNTER — Encounter: Payer: Self-pay | Admitting: Internal Medicine

## 2012-10-17 VITALS — BP 111/78 | HR 78 | Temp 98.5°F | Ht 72.0 in | Wt 220.6 lb

## 2012-10-17 DIAGNOSIS — M7020 Olecranon bursitis, unspecified elbow: Secondary | ICD-10-CM

## 2012-10-17 DIAGNOSIS — E1129 Type 2 diabetes mellitus with other diabetic kidney complication: Secondary | ICD-10-CM

## 2012-10-17 DIAGNOSIS — M7022 Olecranon bursitis, left elbow: Secondary | ICD-10-CM

## 2012-10-17 DIAGNOSIS — N058 Unspecified nephritic syndrome with other morphologic changes: Secondary | ICD-10-CM

## 2012-10-17 DIAGNOSIS — I10 Essential (primary) hypertension: Secondary | ICD-10-CM

## 2012-10-17 MED ORDER — INSULIN GLARGINE 100 UNIT/ML ~~LOC~~ SOLN: 15.0000 [IU] | Freq: Every day | SUBCUTANEOUS | Status: DC

## 2012-10-17 MED ORDER — TRAMADOL HCL 50 MG PO TABS: 50.0000 mg | ORAL_TABLET | Freq: Four times a day (QID) | ORAL | Status: DC | PRN
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
## 2012-10-17 NOTE — Assessment & Plan Note (Signed)
Patient presented to ED yesterday with complaints of left elbow pain which has been present for the past 10 days or so, thought to be olecranon bursitis. While in the ED, labs were performed showing no leukocytosis. ESR and CRP generally wnl. Synovial fluid was sampled from the elbow and revealed [address] [facility] w/ 44% PMN's. Culture and gram stain was negative. No crystals seen on microscopy. X-ray of the elbow shows No acute abnormalities, only changes suggestive of olecranon bursitis.  -Patient still with pain today along with swelling of olecranon process and surrounding joint/arm. Discussed joint protection in the case of bursitis -Given referral for Sports medicine to be seen in the next day or two.  -Patient has a long history of pain medication issues and UDS +ve for cocaine in the past. He has been told previously that he will no longer receive pain meds from the [facility]. He presents with significant bursitis pain today. On review of his labs, given the patient has a history of CKD (most recent Cr 1.67), I did not give him NSAIDS, which is the recommended therapy for pain related to bursitis (celebrex or naproxen). He was given Tramadol 50 mg q6h prn pain, #12, to aid his pain until he is seen by sports medicine in the next couple days.

## 2012-10-17 NOTE — Patient Instructions (Signed)
1. Please schedule a follow up appointment for 2-3 weeks. Please see Sports Medicine 2. Please take all medications as prescribed.  3. If you have worsening of your symptoms or new symptoms arise, please call the clinic ([phone]), or go to the ER immediately if symptoms are severe.  You have done a great job in taking all your medications. I appreciate it very much. Please continue doing that.

## 2012-10-17 NOTE — Assessment & Plan Note (Signed)
Well controlled today, 111/78. Patient claims to be compliant with his medications.

## 2012-10-17 NOTE — Assessment & Plan Note (Addendum)
Patient HbA1c repeated today, found to be 8.5, increased from 7.9 in June. He currently takes Lantus 15 units at night only. He mentioned that he used to be on Glucotrol in the AM but hasn't taken it for some time. After reviewing the patient's chart, it appears the he was taken off this medication d/t cost reasons??.  -Patient was told to start checking his blood sugar at regular intervals, most importantly in the AM, and bring his meter with him to the next appointment. At that time, further adjustments can be made to his DM regimen.  -Patient denies any recent issues with hypo- or hyperglycemia. He denies any dizziness, lightheadedness, nausea, vomiting, headache, diaphoresis, or tremulousness.  -Patient will return in 3-4 weeks for follow up regarding improvement of his bursitis and DM regimen.

## 2012-10-18 ENCOUNTER — Ambulatory Visit (INDEPENDENT_AMBULATORY_CARE_PROVIDER_SITE_OTHER): Payer: Medicare Other | Admitting: Sports Medicine

## 2012-10-18 ENCOUNTER — Encounter: Payer: Self-pay | Admitting: Sports Medicine

## 2012-10-18 VITALS — BP 116/80 | HR 98 | Ht 72.0 in | Wt 220.0 lb

## 2012-10-18 DIAGNOSIS — M7020 Olecranon bursitis, unspecified elbow: Secondary | ICD-10-CM

## 2012-10-18 DIAGNOSIS — M7022 Olecranon bursitis, left elbow: Secondary | ICD-10-CM

## 2012-10-18 MED ORDER — CEPHALEXIN 500 MG PO CAPS: 500.0000 mg | ORAL_CAPSULE | Freq: Three times a day (TID) | ORAL | Status: DC

## 2012-10-18 NOTE — Patient Instructions (Signed)
Thank you for coming in today  We think that you have an infection in the bursa over your elbow Try ACE wrap Take both clindamycin and cephalexin antibiotics Followup on Monday

## 2012-10-18 NOTE — Progress Notes (Signed)
CC: Left elbow pain HPI: Patient is a very pleasant 47 year old male with multiple medical problems who presents for evaluation of left posterior elbow pain. He states that this started last Thursday. He noticed redness, warmth, and pain over the olecranon. He also had a fever at that time. He was seen in urgent care where they felt he had septic olecranon bursitis and sent him to the ER for IV antibiotics. In the ER and aspiration was performed with negative Gram stain. Patient was discharged home without antibiotics and with close followup. He saw his primary doctor at the internal medicine clinic yesterday who referred him here. He is not currently on antibiotics. He states that someone from the ER call him yesterday and told him they were going to give him an antibiotic for infection. This happens the clindamycin on further investigation. Patient states he continues to have a lot of pain and swelling and warmth over the olecranon. It hurts to move the elbow. He cannot lift his child. He is not currently having fevers. However, he does state that he is felt nauseous, ill, and has had very poor appetite. He denies any trauma to the elbow prior to the onset of his symptoms. Fluid studies were negative for gout crystals.  ROS: As above in the HPI. All other systems are stable or negative.  PMH: Includes diabetes mellitus, obesity, sleep apnea, congestive heart failure, hypertension, depression, migraines, chronic kidney disease stage III, neuropathy, mood disorder, coronary artery disease   Social: Patient smokes cigarettes occasionally. He does not drink alcohol. He is raising 3 children. Family: Heart failure,Hypertension, Sarcoidosis, Diabetes type II in Mother. Heart attack in Brother Allergies: Shellfish, adhesive, Zoloft, Tylenol    OBJECTIVE: APPEARANCE:  Patient in no acute distress.The patient appeared well nourished and normally developed. HEENT: No scleral icterus. Conjunctiva  non-injected Resp: Non labored Skin: No rash MSK:  Left elbow: There is obvious swelling over the olecranon. There is fluctuance. This is very tender to palpation. Skin is warm. Patient can move the elbow with some discomfort in all directions. Normal pronation and supination. No elbow joint effusion.    ASSESSMENT: #1. Left olecranon bursitis with concern for septic bursitis   PLAN: Agree with starting antibiotics at this point. We will add Keflex for improved coverage. Patient will be on Keflex and clindamycin. We will see him back on Monday for reevaluation. He was given return precautions to go to the ER should he develop worsening fever, persistent vomiting, other worrisome symptoms.

## 2012-10-20 LAB — BODY FLUID CULTURE

## 2012-10-20 NOTE — Progress Notes (Signed)
I saw and evaluated the patient.  I personally confirmed the key portions of the history and exam documented by Dr. Yetta Barre and I reviewed pertinent patient test results.  The assessment, diagnosis, and plan were formulated together and I agree with the documentation in the resident's note.  The patient was given an urgent sports medicine referral and has been seen. Antibiotics have been started for possible septic bursitis.

## 2012-10-23 ENCOUNTER — Encounter: Payer: Self-pay | Admitting: Sports Medicine

## 2012-10-23 ENCOUNTER — Ambulatory Visit (HOSPITAL_COMMUNITY)
Admission: RE | Admit: 2012-10-23 | Discharge: 2012-10-23 | Disposition: A | Discharge status: Home / Self Care | Payer: Medicare Other | Source: Ambulatory Visit | Attending: Sports Medicine | Admitting: Sports Medicine

## 2012-10-23 ENCOUNTER — Ambulatory Visit (INDEPENDENT_AMBULATORY_CARE_PROVIDER_SITE_OTHER): Payer: Medicare Other | Admitting: Sports Medicine

## 2012-10-23 VITALS — BP 117/89 | HR 103 | Ht 72.0 in | Wt 220.0 lb

## 2012-10-23 DIAGNOSIS — I5022 Chronic systolic (congestive) heart failure: Secondary | ICD-10-CM | POA: Insufficient documentation

## 2012-10-23 DIAGNOSIS — A499 Bacterial infection, unspecified: Secondary | ICD-10-CM

## 2012-10-23 DIAGNOSIS — M109 Gout, unspecified: Secondary | ICD-10-CM

## 2012-10-23 DIAGNOSIS — M2540 Effusion, unspecified joint: Secondary | ICD-10-CM | POA: Insufficient documentation

## 2012-10-23 DIAGNOSIS — I509 Heart failure, unspecified: Secondary | ICD-10-CM | POA: Insufficient documentation

## 2012-10-23 DIAGNOSIS — M7989 Other specified soft tissue disorders: Secondary | ICD-10-CM

## 2012-10-23 DIAGNOSIS — B9689 Other specified bacterial agents as the cause of diseases classified elsewhere: Secondary | ICD-10-CM

## 2012-10-23 DIAGNOSIS — M7020 Olecranon bursitis, unspecified elbow: Secondary | ICD-10-CM

## 2012-10-23 DIAGNOSIS — M71122 Other infective bursitis, left elbow: Secondary | ICD-10-CM | POA: Insufficient documentation

## 2012-10-23 LAB — COMPREHENSIVE METABOLIC PANEL
ALT: 7 U/L (ref 0–53)
AST: 14 U/L (ref 0–37)
CO2: 26 mEq/L (ref 19–32)
Calcium: 8.8 mg/dL (ref 8.4–10.5)
Creatinine, Ser: 1.82 mg/dL — ABNORMAL HIGH (ref 0.50–1.35)
GFR calc Af Amer: 50 mL/min — ABNORMAL LOW (ref >90)
GFR calc non Af Amer: 43 mL/min — ABNORMAL LOW (ref >90)
Glucose, Bld: 219 mg/dL — ABNORMAL HIGH (ref 70–99)
Potassium: 3.1 mEq/L — ABNORMAL LOW (ref 3.5–5.1)
Sodium: 131 mEq/L — ABNORMAL LOW (ref 135–145)
Total Bilirubin: 2.1 mg/dL — ABNORMAL HIGH (ref 0.3–1.2)
Total Protein: 7.4 g/dL (ref 6.0–8.3)

## 2012-10-23 LAB — CBC WITH DIFFERENTIAL/PLATELET
Basophils Absolute: 0 10*3/uL (ref 0.0–0.1)
Eosinophils Relative: 2 % (ref 0–5)
HCT: 43.2 % (ref 39.0–52.0)
Lymphocytes Relative: 18 % (ref 12–46)
Lymphs Abs: 1.1 10*3/uL (ref 0.7–4.0)
MCH: 28.8 pg (ref 26.0–34.0)
MCV: 82.3 fL (ref 78.0–100.0)
Monocytes Absolute: 0.4 10*3/uL (ref 0.1–1.0)
Platelets: 210 10*3/uL (ref 150–400)
RBC: 5.25 MIL/uL (ref 4.22–5.81)
RDW: 15 % (ref 11.5–15.5)
WBC: 5.9 10*3/uL (ref 4.0–10.5)

## 2012-10-23 LAB — URIC ACID: Uric Acid, Serum: 14.6 mg/dL — ABNORMAL HIGH (ref 4.0–7.8)

## 2012-10-23 MED ORDER — COLCHICINE 0.6 MG PO TABS: 0.6000 mg | ORAL_TABLET | Freq: Two times a day (BID) | ORAL | Status: DC

## 2012-10-23 NOTE — Assessment & Plan Note (Addendum)
Pt with acute right foot pain, erythema, and warm foot presented today.  Placed in CAM walker boot due to acute severe pain causing antalgic gait.  With a concern for acute gout, a uric acid was sent which resulted in 14.6.  For his acute gout, will tx with colchicine 1.2 mg qd due to poor candidate for NSAID due to chronic renal failure and prednisone with his recent septic olecranon bursitis.  F/U in two days and consider switching to gout PPx in future after his acute gout has dissipated.  Uric Acid, CMP, CBC ordered and reviewed today.  No acute changes other than Uric acid noted today and slight increase in creatinine to 1.82 (baseline 1.5) so component of possible AKI.  As well, due to concern for DVT w/ hx of clots w/ acute RLE edema and pain and not on anticoagulant, a venous duplex was performed which did not show acute clot.

## 2012-10-23 NOTE — Progress Notes (Signed)
[name] is a 47 y.o. male w/ extensive PMHx who presents today for f/u of L elbow olecranon bursitis as well as R foot pain.    L olecranon bursitis - Pt has been compliant with his ABx of Keflex and Clindamycin since last visit 5 days ago.  Since that point, he has not had any fever, chills, sweats, vomiting.  Has had decreased joint effusion, pain, and warmth to touch.  Denies any numbness/tingling/weakness in his forearm, wrist, or hand.  As well, increase in his elbow flexion/extension with decrease in pain with his ROM.    R foot edema - Pt has never had this happen before, stating his dorsum of his foot has become extremely edematous since his visit 5 days ago.  At that time, he denies any trauma, inciting event.  Began initially as slight pain, worse with walking, that slowly progressed to severe pain with any type of movement over the past 2 days, limiting him to bed.  Located specifically on the dorsum of his foot, nothing in his ankle joint, plantar aspect of his foot or numbness/tingling or weakness in his digits.  Has not noticed anything that does improve the pain for him.    Social History Main Topics  . Smoking status: Current Some Day Smoker    Types: Cigars    Last Attempt to Quit: 07/27/2007  . Smokeless tobacco: Former Neurosurgeon     Comment: cigars only when stressed out  . Alcohol Use: No     Comment: no alcohol since 2012  . Drug Use: Yes    Special: Marijuana, Cocaine  . Sexual Activity: Not Currently    Current Outpatient Prescriptions on File Prior to Visit  Medication Sig Dispense Refill  . aspirin EC 81 MG tablet Take 1 tablet (81 mg total) by mouth daily.  30 tablet  6  . cephALEXin (KEFLEX) 500 MG capsule Take 1 capsule (500 mg total) by mouth 3 (three) times daily.  30 capsule  0  . clopidogrel (PLAVIX) 75 MG tablet Take 1 tablet (75 mg total) by mouth daily with breakfast.  30 tablet  11  . clorazepate (TRANXENE) 7.5 MG tablet Take 1 tablet (7.5 mg total) by  mouth daily. Take three tablets by mouth three times a day (1 in the morning and 2 in the evening): For anxiety  30 tablet  3  . dextromethorphan-guaiFENesin (MUCINEX DM) 30-600 MG per 12 hr tablet Take 1 tablet by mouth every 12 (twelve) hours.      . digoxin (LANOXIN) 0.125 MG tablet Take 1 tablet (0.125 mg total) by mouth daily. For heart problems      . hydrALAZINE (APRESOLINE) 25 MG tablet Take 1.5 tablets (37.5 mg total) by mouth every 8 (eight) hours.  30 tablet  0  . hydrocortisone cream 1 % Apply topically 2 (two) times daily.  30 g  0  . hydrOXYzine (ATARAX/VISTARIL) 25 MG tablet Take 50 mg by mouth at bedtime. Take 1 or 2 tablets by mouth 3 times daily as needed for itching.(Max of 6 tablets a day): For anxiety/itching      . insulin glargine (LANTUS) 100 UNIT/ML injection Inject 0.15 mLs (15 Units total) into the skin at bedtime.  10 mL  5  . isosorbide mononitrate (IMDUR) 60 MG 24 hr tablet Take 1 tablet (60 mg total) by mouth daily. For chronic chest pains      . metolazone (ZAROXOLYN) 2.5 MG tablet Take 1 tablet (2.5 mg total) by  mouth every Wednesday and Saturday.  8 tablet  0  . omeprazole (PRILOSEC) 20 MG capsule Take 20 mg by mouth daily.      Marland Kitchen oxyCODONE (ROXICODONE) 5 MG immediate release tablet Take 1 tablet (5 mg total) by mouth every 4 (four) hours as needed for pain.  15 tablet  0  . potassium chloride (K-DUR) 10 MEQ tablet Take 2 tablets (20 mEq total) by mouth daily. For low potassium, take extra tab on Wed and Sat when you take Metolazone  70 tablet  6  . rosuvastatin (CRESTOR) 10 MG tablet Take 1 tablet (10 mg total) by mouth daily. For control of high cholesterol      . torsemide (DEMADEX) 20 MG tablet Take 120 mg by mouth daily with breakfast. For control of high blood pressure/sewllings.      . traMADol (ULTRAM) 50 MG tablet Take 1 tablet (50 mg total) by mouth every 6 (six) hours as needed for pain.  12 tablet  0  . [DISCONTINUED] metoprolol tartrate (LOPRESSOR) 25 MG  tablet Take 1 tablet (25 mg total) by mouth 2 (two) times daily.  60 tablet  1  . [DISCONTINUED] traZODone (DESYREL) 25 mg TABS Take 0.5 tablets (25 mg total) by mouth at bedtime.  30 tablet  1   No current facility-administered medications on file prior to visit.    ROS: Per HPI.  All other systems reviewed and are negative.   Physical Exam Filed Vitals:   10/23/12 1009  BP: 117/89  Pulse: 103    Physical Examination:  Left elbow: Full range of motion. Small amount of boggy synovitis at the olecranon bursa but no significant fluctuation. No erythema. Area is not warm to touch. Minimal tenderness to touch. No vascular intact distally  R foot - moderate edema across the dorsum of the foot.  TTP along midfoot, 1st MTP.  Active ROM 10 degrees dorsiflexion, 50 degrees plantar flexion limited by pain.  MS 5/5 RLE, neurovascular intact RLE. Foot is slightly warm to touch and mildly erythematous.  Uric Acid 10/23/12 - 14.6   Lab Results  Component Value Date   CREATININE 1.82* 10/23/2012

## 2012-10-23 NOTE — Progress Notes (Signed)
Right lower extremity venous duplex completed.  Right:  No evidence of DVT, superficial thrombosis, or Baker's cyst.  Left:  Negative for DVT in the common femoral vein.  

## 2012-10-23 NOTE — Assessment & Plan Note (Signed)
[id number] near 20, however, has been like this in the past so pt may have chronic intravascular depletion.  Consider repeat in 1-2 weeks and encourage pt to stay hydrated on non alcoholic beverages.

## 2012-10-23 NOTE — Patient Instructions (Addendum)
Colchicine tablets What is this medicine? COLCHICINE (KOL chi seen) is for joint pain and swelling due to attacks of acute gouty arthritis. The medicine is also used to treat familial Mediterranean fever. This medicine may be used for other purposes; ask your health care provider or pharmacist if you have questions. What should I tell my health care provider before I take this medicine? They need to know if you have any of these conditions: -anemia -blood disorders like leukemia or lymphoma -heart disease -immune system problems -intestinal disease -kidney disease -liver disease -muscle pain or weakness -take other medicines -stomach problems -an unusual or allergic reaction to colchicine, other medicines, lactose, foods, dyes, or preservatives -pregnant or trying to get pregnant -breast-feeding How should I use this medicine? Take this medicine by mouth with a full glass of water. Follow the directions on the prescription label. You can take it with or without food. If it upsets your stomach, take it with food. Take your medicine at regular intervals. Do not take your medicine more often than directed. A special MedGuide will be given to you by the pharmacist with each prescription and refill. Be sure to read this information carefully each time. Talk to your pediatrician regarding the use of this medicine in children. While this drug may be prescribed for children as young as 66 years old for selected conditions, precautions do apply. Patients over 12 years old may have a stronger reaction and need a smaller dose. Overdosage: If you think you have taken too much of this medicine contact a poison control center or emergency room at once. NOTE: This medicine is only for you. Do not share this medicine with others. What if I miss a dose? If you miss a dose, take it as soon as you can. If it is almost time for your next dose, take only that dose. Do not take double or extra doses. What may  interact with this medicine? -alcohol -certain medicines for cholesterol like atorvastatin -certain medicines for coughs and colds -certain medicines to help you breathe better -cyclosporine -digoxin -epinephrine -grapefruit or grapefruit juice -methenamine -sodium bicarbonate -some antibiotics like clarithromycin, erythromycin, and telithromycin -some medicines for an irregular heartbeat or other heart problems -some medicines for cancer, like lapatinib and tamoxifen -some medicines for fungal infection -some medicines for HIV This list may not describe all possible interactions. Give your health care provider a list of all the medicines, herbs, non-prescription drugs, or dietary supplements you use. Also tell them if you smoke, drink alcohol, or use illegal drugs. Some items may interact with your medicine. What should I watch for while using this medicine? Visit your doctor or health care professional for regular checks on your progress. You may need periodic blood checks. Alcohol can increase the chance of getting stomach problems and gout attacks. Do not drink alcohol. What side effects may I notice from receiving this medicine? Side effects that you should report to your doctor or health care professional as soon as possible: -allergic reactions like skin rash, itching or hives, swelling of the face, lips, or tongue -fever, chills, or sore throat -muscle tenderness, pain, or weakness -numbness or tingling in hands or feet -unusual bleeding or bruising -unusually weak or tired -vomiting Side effects that usually do not require medical attention (report to your doctor or health care professional if they continue or are bothersome): -diarrhea -hair loss -loss of appetite -stomach pain or nausea This list may not describe all possible side effects. Call your doctor  for medical advice about side effects. You may report side effects to FDA at 1-800-FDA-1088. Where should I keep my  medicine? Keep out of the reach of children. Store at room temperature between 15 and 30 degrees C (59 and 86 degrees F). Keep container tightly closed. Protect from light. Throw away any unused medicine after the expiration date. NOTE: This sheet is a summary. It may not cover all possible information. If you have questions about this medicine, talk to your doctor, pharmacist, or health care provider.  2012, [employer]. (08/26/2009 12:57:48 PM)

## 2012-10-23 NOTE — Assessment & Plan Note (Signed)
Improved since last visit four days ago and denies any recent fever, chills, or sweats.  Elbow with minimal olecranon fluid, and no erythema/warm joint.  Continue with ABx course until completion.

## 2012-10-25 ENCOUNTER — Telehealth: Payer: Self-pay | Admitting: Sports Medicine

## 2012-10-25 ENCOUNTER — Ambulatory Visit: Payer: Medicare Other | Admitting: Sports Medicine

## 2012-10-25 NOTE — Telephone Encounter (Signed)
I spoke with [name] on the phone today. Recent blood work shows a uric acid of 14.6. WBC was within normal limits and Doppler was negative for DVT. Therefore, I think his right foot pain and swelling is due to a gout. He is taking Colcrys 0.6mg  twice a day but it is causing him some diarrhea. I've asked him to cut down to once a day for the next day or so and if diarrhea persists he may need to stop the medicine altogether. I will have the patient followup with me in one week.

## 2012-11-01 ENCOUNTER — Ambulatory Visit: Payer: Medicare Other | Admitting: Sports Medicine

## 2012-11-02 ENCOUNTER — Encounter: Payer: Self-pay | Admitting: Internal Medicine

## 2012-11-02 ENCOUNTER — Ambulatory Visit (INDEPENDENT_AMBULATORY_CARE_PROVIDER_SITE_OTHER): Payer: Medicare Other | Admitting: Internal Medicine

## 2012-11-02 DIAGNOSIS — N058 Unspecified nephritic syndrome with other morphologic changes: Secondary | ICD-10-CM

## 2012-11-02 DIAGNOSIS — T788XXS Other adverse effects, not elsewhere classified, sequela: Secondary | ICD-10-CM

## 2012-11-02 DIAGNOSIS — T7589XS Other specified effects of external causes, sequela: Secondary | ICD-10-CM

## 2012-11-02 DIAGNOSIS — K254 Chronic or unspecified gastric ulcer with hemorrhage: Secondary | ICD-10-CM

## 2012-11-02 DIAGNOSIS — I5022 Chronic systolic (congestive) heart failure: Secondary | ICD-10-CM

## 2012-11-02 DIAGNOSIS — I214 Non-ST elevation (NSTEMI) myocardial infarction: Secondary | ICD-10-CM

## 2012-11-02 DIAGNOSIS — I509 Heart failure, unspecified: Secondary | ICD-10-CM

## 2012-11-02 DIAGNOSIS — E1129 Type 2 diabetes mellitus with other diabetic kidney complication: Secondary | ICD-10-CM

## 2012-11-02 DIAGNOSIS — I10 Essential (primary) hypertension: Secondary | ICD-10-CM

## 2012-11-02 LAB — LIPID PANEL
Cholesterol: 98 mg/dL (ref 0–200)
HDL: 29 mg/dL — ABNORMAL LOW (ref >39)
Total CHOL/HDL Ratio: 3.4 Ratio
VLDL: 12 mg/dL (ref 0–40)

## 2012-11-02 LAB — GLUCOSE, CAPILLARY: Glucose-Capillary: 276 mg/dL — ABNORMAL HIGH (ref 70–99)

## 2012-11-02 LAB — COMPLETE METABOLIC PANEL WITH GFR
AST: 18 U/L (ref 0–37)
Albumin: 3.4 g/dL — ABNORMAL LOW (ref 3.5–5.2)
Alkaline Phosphatase: 77 U/L (ref 39–117)
BUN: 44 mg/dL — ABNORMAL HIGH (ref 6–23)
Potassium: 3.3 mEq/L — ABNORMAL LOW (ref 3.5–5.3)
Sodium: 134 mEq/L — ABNORMAL LOW (ref 135–145)
Total Protein: 7 g/dL (ref 6.0–8.3)

## 2012-11-02 MED ORDER — INSULIN GLARGINE 100 UNIT/ML ~~LOC~~ SOLN: 15.0000 [IU] | Freq: Every day | SUBCUTANEOUS | Status: DC

## 2012-11-02 MED ORDER — METOLAZONE 2.5 MG PO TABS: 2.5000 mg | ORAL_TABLET | ORAL | Status: DC

## 2012-11-02 MED ORDER — TORSEMIDE 20 MG PO TABS: 120.0000 mg | ORAL_TABLET | Freq: Every day | ORAL | Status: DC

## 2012-11-02 MED ORDER — RANITIDINE HCL 150 MG PO TABS: 150.0000 mg | ORAL_TABLET | Freq: Every day | ORAL | Status: DC

## 2012-11-02 MED ORDER — DIGOXIN 125 MCG PO TABS: 0.1250 mg | ORAL_TABLET | Freq: Every day | ORAL | Status: DC

## 2012-11-02 NOTE — Patient Instructions (Signed)
I will refer to a skin specialist  Please continue with your medications  Please follow up with your regular doctor in 1-2 months

## 2012-11-03 LAB — MICROALBUMIN / CREATININE URINE RATIO: Microalb, Ur: 26.33 mg/dL — ABNORMAL HIGH (ref 0.00–1.89)
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
## 2012-11-03 NOTE — Assessment & Plan Note (Signed)
Gave the chronicity of this rash, I will refer him to dermatology. Appointment scheduled for 11/09/2012. Will not prescribe high dose cream for now since his dermatology evaluation is close.

## 2012-11-03 NOTE — Assessment & Plan Note (Addendum)
Symptoms are stable. No evidence of fluid overload. Will not make changed in his medications today. Will order CMP for K level

## 2012-11-09 ENCOUNTER — Ambulatory Visit (HOSPITAL_COMMUNITY)
Admission: RE | Admit: 2012-11-09 | Discharge: 2012-11-09 | Disposition: A | Discharge status: Home / Self Care | Payer: Medicare Other | Source: Ambulatory Visit | Attending: Internal Medicine | Admitting: Internal Medicine

## 2012-11-09 ENCOUNTER — Encounter (HOSPITAL_COMMUNITY): Payer: Self-pay

## 2012-11-09 ENCOUNTER — Ambulatory Visit: Payer: Medicare Other | Admitting: Sports Medicine

## 2012-11-09 VITALS — BP 102/76 | HR 105 | Wt 221.8 lb

## 2012-11-09 DIAGNOSIS — N189 Chronic kidney disease, unspecified: Secondary | ICD-10-CM | POA: Insufficient documentation

## 2012-11-09 DIAGNOSIS — I509 Heart failure, unspecified: Secondary | ICD-10-CM | POA: Insufficient documentation

## 2012-11-09 DIAGNOSIS — I5022 Chronic systolic (congestive) heart failure: Secondary | ICD-10-CM | POA: Insufficient documentation

## 2012-11-09 DIAGNOSIS — I2510 Atherosclerotic heart disease of native coronary artery without angina pectoris: Secondary | ICD-10-CM

## 2012-11-09 MED ORDER — TORSEMIDE 20 MG PO TABS: 120.0000 mg | ORAL_TABLET | Freq: Every day | ORAL | Status: DC

## 2012-11-09 MED ORDER — DIGOXIN 125 MCG PO TABS: 0.1250 mg | ORAL_TABLET | Freq: Every day | ORAL | Status: DC

## 2012-11-09 MED ORDER — METOLAZONE 2.5 MG PO TABS: 2.5000 mg | ORAL_TABLET | ORAL | Status: DC

## 2012-11-09 MED ORDER — POTASSIUM CHLORIDE CRYS ER 20 MEQ PO TBCR: 40.0000 meq | EXTENDED_RELEASE_TABLET | Freq: Every day | ORAL | Status: DC

## 2012-11-09 NOTE — Patient Instructions (Signed)
Start taking all your medications again.  Increase your potassium to 40 mew (2 tablets) once a day.  Go get labs at [facility] next Thursday.  Follow up 1 month  Do the following things EVERYDAY: 1) Weigh yourself in the morning before breakfast. Write it down and keep it in a log. 2) Take your medicines as prescribed 3) Eat low salt foods-Limit salt (sodium) to 2000 mg per day.  4) Stay as active as you can everyday 5) Limit all fluids for the day to less than 2 liters 6)

## 2012-11-09 NOTE — Progress Notes (Signed)
Patient ID: [name], male   DOB: 19-Feb-1965, 47 y.o.   MRN: [id number] Psychiatrist: Dr Anthony Sar PCP: Dr Loistine Chance  HPI: [name] is a 47 y/o male with HTN and DM2 with longstanding HF due to NICM (probably ETOH), EF 20%. Current cocaine abuse. Cath in 2008 showed left circumflex with 40% stenosis. Baseline weight about 210. Was previously on [facility] but this ended in December in 2013 as he was doing well.   Admitted 03/2011 with NYHA Class IV symptoms, requiring milrinone therapy and IV lasix. Cath 04/13/11: cardiogenic shock with biventricular failure (R>L) and possible restrictive physiology. RA = 22 with steep y-descents, RV = 45/12/25, PA = 51/25 (34), PCW = 18-20, Fick cardiac output/index = 3.1/1.4, Thermo CO/CI = 3.6/1.6, PVR = 4.8 Woods O2 sat = 85% (after sedation), PA sat = 31%, 36%. Milrinone was initiated for low output physiology.   Admitted to [facility] 06/2012 witih CP/elevated troponin and increased SOB. He had a +USD for cocaine. He also had LHC 07/17/12 showing subtotal thrombotic occlusion of the distal OM1. It was too small at this point for PCI and was started on plavix.  Echo showed EF 10% with no LV thrombus and moderately decreased RV systolic function.  His d/c weight was 206 and now 228 lbs, though this is lower than 230 which was his weight at last appointment in this office.  Admitted to [facility] 08/04/12 through 08/09/12 NSTEMI thought to be from non st elevation MI from cocaine induced vasospasm and volume overload. + cocaine. Dry weight 205-208 pounds. Discharge weight 208 pounds.   Follow up: Has been moving all week to new apt with kids, now living at [facility]. Reports feeling fatigued. Has not taken meds for 3 days d/t moving and now SOB. +DOE. Does not have a scale at home. +Palpitations and CP. Drinking less than 2L a day and following low salt diet. Smoking 1-2 black and milds. Reports not using cocaine or drugs and had drug test at PCP and will pee today.     Labs  7/14: K 4.8, creatinine 2 08/09/12 Potassium 4.2 Creatinine 2.0 8/14: K 4, creatinine 1.6, digoxin 0.8 11/02/12: K+ 3.3, Creatinine 1.89, cholesterol 98  Spironolactone stopped due to hyperkalemia. Benazepril and metoprolol stopped due to hypotension.  SH: Homeless, 2 children, lives in [place].  + cocaine abuse until recently, says he has quit.  2-3 cigs/day  FH: No premature CAD.  ROS: All systems negative except as listed in HPI, PMH and Problem List.  Past Medical History  Diagnosis Date  . Hypertension   . CHF (congestive heart failure)     a. 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 b. EF 20-25%, s/p St. Jude AICD placement 04/2007. c. Echo 06/2012: EF 10%.  . History of peptic ulcer disease     EGD 09/2007 shows prox gastric ulcer with black eschar, treated with PPI  . Depression   . History of alcohol abuse     quit approx 2011-2012  . OSA (obstructive sleep apnea)     Intolerant of CPAP  . ICD (implantable cardiac defibrillator) in place   . Diabetes mellitus     insulin dependent  . GERD (gastroesophageal reflux disease)   . Headache(784.0)   . Myasthenia gravis 10/12/2011    Diagnosed in 09/2011. Received Plasmapheresis. CT chest no Thymoma. Acetylcholine receptor antibody negative.    . CKD (chronic kidney disease) stage 3, GFR 30-59 ml/min   . Polysubstance abuse  Including cocaine (+ UDS multiple times in 2014)  . PUD (peptic ulcer disease)   . Pulmonary nodule     a. CT 09/2011.  Marland Kitchen CAD (coronary artery disease)     a. NSTEMI 06/2012: cath showed subtotal thrombotic occlusion of the distal OM1, too small for PCI, ?cocaine-triggered plaque rupture with thrombosis (no LV thrombus on f/u echo).   - NSTEMI (6/14) with subtotal thrombotic occlusion of a distal OM1, too small for intervention.  No LV thrombus on echo.   Current Outpatient Prescriptions  Medication Sig Dispense Refill  . aspirin EC 81 MG tablet Take 1 tablet (81 mg  total) by mouth daily.  30 tablet  6  . cephALEXin (KEFLEX) 500 MG capsule Take 1 capsule (500 mg total) by mouth 3 (three) times daily.  30 capsule  0  . clindamycin (CLEOCIN) 150 MG capsule Take 150 mg by mouth.      . clopidogrel (PLAVIX) 75 MG tablet Take 1 tablet (75 mg total) by mouth daily with breakfast.  30 tablet  11  . clorazepate (TRANXENE) 7.5 MG tablet Take 1 tablet (7.5 mg total) by mouth daily. Take three tablets by mouth three times a day (1 in the morning and 2 in the evening): For anxiety  30 tablet  3  . colchicine 0.6 MG tablet Take 1 tablet (0.6 mg total) by mouth 2 (two) times daily.  20 tablet  0  . dextromethorphan-guaiFENesin (MUCINEX DM) 30-600 MG per 12 hr tablet Take 1 tablet by mouth every 12 (twelve) hours.      . digoxin (LANOXIN) 0.125 MG tablet Take 1 tablet (0.125 mg total) by mouth daily. For heart problems      . hydrALAZINE (APRESOLINE) 25 MG tablet Take 1.5 tablets (37.5 mg total) by mouth every 8 (eight) hours.  30 tablet  0  . hydrocortisone cream 1 % Apply topically 2 (two) times daily.  30 g  0  . hydrOXYzine (ATARAX/VISTARIL) 25 MG tablet Take 50 mg by mouth at bedtime. Take 1 or 2 tablets by mouth 3 times daily as needed for itching.(Max of 6 tablets a day): For anxiety/itching      . insulin glargine (LANTUS) 100 UNIT/ML injection Inject 0.15 mLs (15 Units total) into the skin at bedtime.  10 mL  5  . isosorbide mononitrate (IMDUR) 60 MG 24 hr tablet Take 1 tablet (60 mg total) by mouth daily. For chronic chest pains      . metolazone (ZAROXOLYN) 2.5 MG tablet Take 1 tablet (2.5 mg total) by mouth every Wednesday and Saturday.  8 tablet  0  . omeprazole (PRILOSEC) 20 MG capsule Take 20 mg by mouth daily.      Marland Kitchen oxyCODONE (ROXICODONE) 5 MG immediate release tablet Take 1 tablet (5 mg total) by mouth every 4 (four) hours as needed for pain.  15 tablet  0  . potassium chloride (K-DUR) 10 MEQ tablet Take 2 tablets (20 mEq total) by mouth daily. For low  potassium, take extra tab on Wed and Sat when you take Metolazone  70 tablet  6  . ranitidine (ZANTAC) 150 MG tablet Take 1 tablet (150 mg total) by mouth daily.  60 tablet  3  . rosuvastatin (CRESTOR) 10 MG tablet Take 1 tablet (10 mg total) by mouth daily. For control of high cholesterol      . torsemide (DEMADEX) 20 MG tablet Take 6 tablets (120 mg total) by mouth daily with breakfast. For control of high blood  pressure/sewllings.  30 tablet  3  . traMADol (ULTRAM) 50 MG tablet Take 1 tablet (50 mg total) by mouth every 6 (six) hours as needed for pain.  12 tablet  0  . [DISCONTINUED] metoprolol tartrate (LOPRESSOR) 25 MG tablet Take 1 tablet (25 mg total) by mouth 2 (two) times daily.  60 tablet  1  . [DISCONTINUED] traZODone (DESYREL) 25 mg TABS Take 0.5 tablets (25 mg total) by mouth at bedtime.  30 tablet  1   No current facility-administered medications for this encounter.   PHYSICAL EXAM: Filed Vitals:   11/09/12 1135  BP: 102/76  Pulse: 105  Weight: 221 lb 12.8 oz (100.608 kg)  SpO2: 100%   General:  Chronically ill appearing.  No resp difficulty;  HEENT: normal Neck: supple. JVP 12. Carotids 2+ bilaterally; no bruits. No lymphadenopathy or thryomegaly appreciated. Cor: PMI normal. Regular rate & rhythm. 2/6 systolic murmur LLSB Lungs: clear Abdomen: obese, soft, nontender, mildly distended. No hepatosplenomegaly. No bruits or masses. Good bowel sounds. Extremities: no cyanosis, clubbing, rash.  1+ ankle edema. Neuro: alert & orientedx3, cranial nerves grossly intact. Moves all 4 extremities w/o difficulty. Affect pleasant.  ASSESSMENT & PLAN: 1. CHF: Chronic systolic CHF, nonischemic cardiomyopathy.  Possibly related to cocaine abuse and prior ETOH abuse.  - He currently has NYHA III symptoms and volume status is elevated. He has not taken his medications since Monday d/t he has been so busy with moving into new apartment. Reinforced again to patient the importance to take his  medications daily. - Restart torsemide 120 mg daily and metolazone 2.5 mg prior to torsemide on Wednesdays and Saturdays. K+ 3.3 (10/16) will increase daily KCL to 40 meq. - Restart digoxin 0.125 mg, hydralazine 37.5 mg TID, and IMDUR 60 mg daily.   - No ACEI /spiro with CKD and non-compliance in the past.   - He has not been on a beta blocker given cocaine abuse in the past will get urine drug screen next week with BMET and dig level. 2. CAD Thrombotic occlusion of distal OM1 noted on cath in 6/14 after recent presentation with NSTEMI (too small caliber for intervention). Possible cocaine-induced plaque rupture.  Continue Plavix and ASA 81 for medical management of NSTEMI.   3. Cocaine abuse - Reports he continues to not use and we can drug test every time he comes to clinic. Will do urine drug screen next week and could consider starting low dose coreg in the future 4. CKD, stage III - Not on ACE-I or Spiro. Will check BMET next week.   F/U 1 month  [name] B NP-C 12:38 PM  Patient seen with NP, agree with the above note.  Patient has not taken his meds x 3 days and is volume overloaded on exam. He needs to restart on his prior home meds and take them daily.  We will see him back in 1 month to see how he looks when he is taking his medications.   [name] 11/09/2012

## 2012-11-10 NOTE — Addendum Note (Signed)
Encounter addended by: Theresia Bough, CMA on: 11/10/2012  1:57 PM<BR>     Documentation filed: Orders

## 2012-11-10 NOTE — Progress Notes (Signed)
Case discussed with Dr. Zada Girt soon after the resident saw the patient.  We reviewed the resident's history and exam and pertinent patient test results.  I agree with the assessment, diagnosis, and plan of care documented in the resident's note.  Agree with referral for possible biopsy given chronicity of the rash.

## 2012-11-10 NOTE — Addendum Note (Signed)
Addended by: Neomia Dear on: 11/10/2012 05:28 PM   Modules accepted: Orders

## 2012-11-16 ENCOUNTER — Ambulatory Visit: Payer: Medicare Other

## 2012-11-16 DIAGNOSIS — I5022 Chronic systolic (congestive) heart failure: Secondary | ICD-10-CM

## 2012-11-17 LAB — DIGOXIN LEVEL: Digoxin Level: 0.26 ng/mL — ABNORMAL LOW (ref 0.8–2.0)

## 2012-11-23 ENCOUNTER — Other Ambulatory Visit: Payer: Self-pay

## 2012-11-28 ENCOUNTER — Encounter: Admitting: Internal Medicine

## 2012-11-28 ENCOUNTER — Encounter: Payer: Self-pay | Admitting: Internal Medicine

## 2012-11-30 ENCOUNTER — Other Ambulatory Visit: Payer: Self-pay | Admitting: Internal Medicine

## 2012-11-30 ENCOUNTER — Telehealth: Payer: Self-pay | Admitting: *Deleted

## 2012-11-30 ENCOUNTER — Other Ambulatory Visit: Payer: Self-pay | Admitting: Family Medicine

## 2012-11-30 NOTE — Telephone Encounter (Signed)
This refill comes with a note from the pharmacist that the patien t is in severe pain.  Is this a current note?  Please investigate and see if this patient needs to be seen.

## 2012-11-30 NOTE — Telephone Encounter (Signed)
Pt called stating he got up today and left foot was swollen.  He stepped out of bed and foot gave away.  He has fallen 5 times today.  He is using crutches.  Rates pain 10/10 with pain radiating up leg, it's constant. He has not taken any pain meds and does not have any. ( per patient) Pt advised to go to [facility] or ED for xrays, now.   Do not bear any weight on leg.  Pt will call relative to bring him to ED. Dr Aundria Rud informed.

## 2012-12-01 NOTE — Telephone Encounter (Signed)
Mr Broad no longer gets controlled meds from [facility] per FYI.

## 2012-12-01 NOTE — Telephone Encounter (Signed)
See telephone note 11/13 per Larita Fife.

## 2012-12-11 ENCOUNTER — Ambulatory Visit (HOSPITAL_COMMUNITY)
Admission: RE | Admit: 2012-12-11 | Discharge: 2012-12-11 | Disposition: A | Discharge status: Home / Self Care | Payer: Medicare Other | Source: Ambulatory Visit | Attending: Internal Medicine | Admitting: Internal Medicine

## 2012-12-11 ENCOUNTER — Encounter (HOSPITAL_COMMUNITY): Payer: Self-pay

## 2012-12-11 ENCOUNTER — Telehealth (HOSPITAL_COMMUNITY): Payer: Self-pay | Admitting: Adult Health

## 2012-12-11 VITALS — BP 90/66 | HR 104 | Wt 214.1 lb

## 2012-12-11 DIAGNOSIS — Z794 Long term (current) use of insulin: Secondary | ICD-10-CM | POA: Insufficient documentation

## 2012-12-11 DIAGNOSIS — I509 Heart failure, unspecified: Secondary | ICD-10-CM | POA: Insufficient documentation

## 2012-12-11 DIAGNOSIS — I252 Old myocardial infarction: Secondary | ICD-10-CM | POA: Insufficient documentation

## 2012-12-11 DIAGNOSIS — N183 Chronic kidney disease, stage 3 unspecified: Secondary | ICD-10-CM | POA: Insufficient documentation

## 2012-12-11 DIAGNOSIS — I129 Hypertensive chronic kidney disease with stage 1 through stage 4 chronic kidney disease, or unspecified chronic kidney disease: Secondary | ICD-10-CM | POA: Insufficient documentation

## 2012-12-11 DIAGNOSIS — I5022 Chronic systolic (congestive) heart failure: Secondary | ICD-10-CM | POA: Insufficient documentation

## 2012-12-11 DIAGNOSIS — I2510 Atherosclerotic heart disease of native coronary artery without angina pectoris: Secondary | ICD-10-CM | POA: Insufficient documentation

## 2012-12-11 DIAGNOSIS — G4733 Obstructive sleep apnea (adult) (pediatric): Secondary | ICD-10-CM | POA: Insufficient documentation

## 2012-12-11 DIAGNOSIS — I428 Other cardiomyopathies: Secondary | ICD-10-CM | POA: Insufficient documentation

## 2012-12-11 DIAGNOSIS — E119 Type 2 diabetes mellitus without complications: Secondary | ICD-10-CM | POA: Insufficient documentation

## 2012-12-11 LAB — BASIC METABOLIC PANEL
BUN: 56 mg/dL — ABNORMAL HIGH (ref 6–23)
CO2: 28 mEq/L (ref 19–32)
Calcium: 9.2 mg/dL (ref 8.4–10.5)
Creatinine, Ser: 2.26 mg/dL — ABNORMAL HIGH (ref 0.50–1.35)
GFR calc non Af Amer: 33 mL/min — ABNORMAL LOW (ref >90)
Sodium: 132 mEq/L — ABNORMAL LOW (ref 135–145)

## 2012-12-11 LAB — PRO B NATRIURETIC PEPTIDE: Pro B Natriuretic peptide (BNP): 2957 pg/mL — ABNORMAL HIGH (ref 0–125)

## 2012-12-11 NOTE — Telephone Encounter (Signed)
Provided lab results.  K 3.1 . Instructed to take 40 meq today. Creatinine up from 1.8 to 2.2. Instructed to hold torsemide  And Wednesday for the next 2 days. Plan to restart torsemide on Thursday.   Refer to Paramed[facility].   [name] verbalized understanding.   CLEGG,AMY 4:47 PM

## 2012-12-11 NOTE — Patient Instructions (Addendum)
Follow up in 6 weeks  Do the following things EVERYDAY: 1) Weigh yourself in the morning before breakfast. Write it down and keep it in a log. 2) Take your medicines as prescribed 3) Eat low salt foods-Limit salt (sodium) to 2000 mg per day.  4) Stay as active as you can everyday 5) Limit all fluids for the day to less than 2 liters 

## 2012-12-11 NOTE — Addendum Note (Signed)
Encounter addended by: Noralee Space, RN on: 12/11/2012  4:57 PM<BR>     Documentation filed: Orders

## 2012-12-11 NOTE — Progress Notes (Signed)
Patient ID: [name], male   DOB: 05-19-1965, 47 y.o.   MRN: [id number] Psychiatrist: Dr Anthony Sar PCP: Dr Loistine Chance  HPI: [name] is a 47 y/o male with HTN and DM2 with longstanding HF due to NICM (probably ETOH), EF 20%. Current cocaine abuse. Cath in 2008 showed left circumflex with 40% stenosis. Baseline weight about 210. Was previously on [facility] but this ended in December in 2013 as he was doing well.   Admitted 03/2011 with NYHA Class IV symptoms, requiring milrinone therapy and IV lasix. Cath 04/13/11: cardiogenic shock with biventricular failure (R>L) and possible restrictive physiology. RA = 22 with steep y-descents, RV = 45/12/25, PA = 51/25 (34), PCW = 18-20, Fick cardiac output/index = 3.1/1.4, Thermo CO/CI = 3.6/1.6, PVR = 4.8 Woods O2 sat = 85% (after sedation), PA sat = 31%, 36%. Milrinone was initiated for low output physiology.   Admitted to [facility] 06/2012 witih CP/elevated troponin and increased SOB. He had a +USD for cocaine. He also had LHC 07/17/12 showing subtotal thrombotic occlusion of the distal OM1. It was too small at this point for PCI and was started on plavix.  Echo showed EF 10% with no LV thrombus and moderately decreased RV systolic function.  His d/c weight was 206 and now 228 lbs, though this is lower than 230 which was his weight at last appointment in this office.  Admitted to [facility] 08/04/12 through 08/09/12 NSTEMI thought to be from non st elevation MI from cocaine induced vasospasm and volume overload. + cocaine. Dry weight 205-208 pounds. Discharge weight 208 pounds.   He returns for follow up. Last visit he restarted 120 mg torsemide daily and Metolazone twice a week. He was restarted on digoxin, hydralazine, and  Imdur. Says his breathing is much better. Just has mild dyspnea with exertion. Denies PND/Orthopnea/CP. Weight at home 221-227 pounds.Drinking less than 2L a day and following low salt diet. Smoking 1-2  black and milds twice a week. Fredna Dow he is not using  any drugs. Taking all medications.     Labs  7/14: K 4.8, creatinine 2 08/09/12 Potassium 4.2 Creatinine 2.0 8/14: K 4, creatinine 1.6, digoxin 0.8 11/02/12: K+ 3.3, Creatinine 1.89, cholesterol 98  Spironolactone stopped due to hyperkalemia. Benazepril and metoprolol stopped due to hypotension.  SH: Homeless, 2 children, lives in [place].  + cocaine abuse until recently, says he has quit.  2-3 cigs/day  FH: No premature CAD.  ROS: All systems negative except as listed in HPI, PMH and Problem List.  Past Medical History  Diagnosis Date  . Hypertension   . CHF (congestive heart failure)     a. 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 b. EF 20-25%, s/p St. Jude AICD placement 04/2007. c. Echo 06/2012: EF 10%.  . History of peptic ulcer disease     EGD 09/2007 shows prox gastric ulcer with black eschar, treated with PPI  . Depression   . History of alcohol abuse     quit approx 2011-2012  . OSA (obstructive sleep apnea)     Intolerant of CPAP  . ICD (implantable cardiac defibrillator) in place   . Diabetes mellitus     insulin dependent  . GERD (gastroesophageal reflux disease)   . Headache(784.0)   . Myasthenia gravis 10/12/2011    Diagnosed in 09/2011. Received Plasmapheresis. CT chest no Thymoma. Acetylcholine receptor antibody negative.    . CKD (chronic kidney disease) stage 3, GFR 30-59 ml/min   . Polysubstance abuse  Including cocaine (+ UDS multiple times in 2014)  . PUD (peptic ulcer disease)   . Pulmonary nodule     a. CT 09/2011.  Marland Kitchen CAD (coronary artery disease)     a. NSTEMI 06/2012: cath showed subtotal thrombotic occlusion of the distal OM1, too small for PCI, ?cocaine-triggered plaque rupture with thrombosis (no LV thrombus on f/u echo).   - NSTEMI (6/14) with subtotal thrombotic occlusion of a distal OM1, too small for intervention.  No LV thrombus on echo.   Current Outpatient Prescriptions  Medication Sig Dispense Refill  . aspirin EC 81  MG tablet Take 1 tablet (81 mg total) by mouth daily.  30 tablet  6  . cephALEXin (KEFLEX) 500 MG capsule Take 1 capsule (500 mg total) by mouth 3 (three) times daily.  30 capsule  0  . clindamycin (CLEOCIN) 150 MG capsule Take 150 mg by mouth.      . clopidogrel (PLAVIX) 75 MG tablet Take 1 tablet (75 mg total) by mouth daily with breakfast.  30 tablet  11  . clorazepate (TRANXENE) 7.5 MG tablet Take 1 tablet (7.5 mg total) by mouth daily. Take three tablets by mouth three times a day (1 in the morning and 2 in the evening): For anxiety  30 tablet  3  . colchicine 0.6 MG tablet Take 1 tablet (0.6 mg total) by mouth 2 (two) times daily.  20 tablet  0  . dextromethorphan-guaiFENesin (MUCINEX DM) 30-600 MG per 12 hr tablet Take 1 tablet by mouth every 12 (twelve) hours.      . digoxin (LANOXIN) 0.125 MG tablet Take 1 tablet (0.125 mg total) by mouth daily. For heart  30 tablet  5  . hydrALAZINE (APRESOLINE) 25 MG tablet Take 1.5 tablets (37.5 mg total) by mouth every 8 (eight) hours.  30 tablet  0  . hydrocortisone cream 1 % Apply topically 2 (two) times daily.  30 g  0  . hydrOXYzine (ATARAX/VISTARIL) 25 MG tablet Take 50 mg by mouth at bedtime. Take 1 or 2 tablets by mouth 3 times daily as needed for itching.(Max of 6 tablets a day): For anxiety/itching      . insulin glargine (LANTUS) 100 UNIT/ML injection Inject 0.15 mLs (15 Units total) into the skin at bedtime.  10 mL  5  . isosorbide mononitrate (IMDUR) 60 MG 24 hr tablet Take 1 tablet (60 mg total) by mouth daily. For chronic chest pains      . metolazone (ZAROXOLYN) 2.5 MG tablet Take 1 tablet (2.5 mg total) by mouth every Wednesday and Saturday.  8 tablet  6  . omeprazole (PRILOSEC) 20 MG capsule Take 20 mg by mouth daily.      Marland Kitchen oxyCODONE (ROXICODONE) 5 MG immediate release tablet Take 1 tablet (5 mg total) by mouth every 4 (four) hours as needed for pain.  15 tablet  0  . potassium chloride SA (K-DUR,KLOR-CON) 20 MEQ tablet Take 2 tablets  (40 mEq total) by mouth daily.  60 tablet  6  . ranitidine (ZANTAC) 150 MG tablet Take 1 tablet (150 mg total) by mouth daily.  60 tablet  3  . rosuvastatin (CRESTOR) 10 MG tablet Take 1 tablet (10 mg total) by mouth daily. For control of high cholesterol      . torsemide (DEMADEX) 20 MG tablet Take 6 tablets (120 mg total) by mouth daily with breakfast. For control of high blood pressure/sewllings.  180 tablet  3  . traMADol (ULTRAM) 50 MG tablet  Take 1 tablet (50 mg total) by mouth every 6 (six) hours as needed for pain.  12 tablet  0  . [DISCONTINUED] metoprolol tartrate (LOPRESSOR) 25 MG tablet Take 1 tablet (25 mg total) by mouth 2 (two) times daily.  60 tablet  1  . [DISCONTINUED] traZODone (DESYREL) 25 mg TABS Take 0.5 tablets (25 mg total) by mouth at bedtime.  30 tablet  1   No current facility-administered medications for this encounter.   PHYSICAL EXAM: Filed Vitals:   12/11/12 0959  BP: 90/66  Pulse: 104  Weight: 214 lb 1.9 oz (97.124 kg)  SpO2: 100%   General:  Chronically ill appearing.  No resp difficulty;  HEENT: normal Neck: supple. JVP 5-6. Carotids 2+ bilaterally; no bruits. No lymphadenopathy or thryomegaly appreciated. Cor: PMI normal. Regular rate & rhythm. 2/6 systolic murmur LLSB Lungs: clear Abdomen: obese, soft, nontender, mildly distended. No hepatosplenomegaly. No bruits or masses. Good bowel sounds. Extremities: no cyanosis, clubbing, rash.  edema. Neuro: alert & orientedx3, cranial nerves grossly intact. Moves all 4 extremities w/o difficulty. Affect pleasant.  ASSESSMENT & PLAN: 1. CHF: Chronic systolic CHF, nonischemic cardiomyopathy.  Possibly related to cocaine abuse and prior ETOH abuse.  - He currently has NYHA III symptoms and volume status stable.   Continue torsemide 120 mg daily and metolazone 2.5 mg prior to torsemide on Wednesdays and Saturdays. Continue daily KCL to 40 meq. - Restart digoxin 0.125 mg, hydralazine 37.5 mg TID, and IMDUR 60 mg  daily.   - No ACEI /spiro with CKD and non-compliance in the past.   - He has not been on a beta blocker given cocaine abuse and low output.  - Check BMET and pro bnp  2. CAD Thrombotic occlusion of distal OM1 noted on cath in 6/14 after recent presentation with NSTEMI (too small caliber for intervention). Possible cocaine-induced plaque rupture.  Continue Plavix and ASA 81 for medical management of NSTEMI.   3. Cocaine abuse- Says he is not using cocaine. Says last cocaine was 07/2012.  4. CKD, stage III - Not on ACE-I or Spiro. Will check BMET   F/U 6 weeks   CLEGG,AMY NP-C 10:09 AM  Patient seen and examined with Tonye Becket, NP. We discussed all aspects of the encounter. I agree with the assessment and plan as stated above.   Overall doing fairly well from HF perspective. NYHA III. Volume status stable. Agree with med changes as above. Reinforced need for daily weights and reviewed use of sliding scale diuretics. May consider re-initiation of low-dose carvedilol in near future if he can tolerate. Check labs.   Suzan Manon,MD 10:52 AM

## 2012-12-25 ENCOUNTER — Telehealth: Payer: Self-pay | Admitting: *Deleted

## 2012-12-25 NOTE — Telephone Encounter (Signed)
Opened in error

## 2012-12-28 ENCOUNTER — Other Ambulatory Visit (HOSPITAL_COMMUNITY): Payer: Self-pay | Admitting: Anesthesiology

## 2012-12-28 DIAGNOSIS — I5022 Chronic systolic (congestive) heart failure: Secondary | ICD-10-CM

## 2012-12-30 DIAGNOSIS — E1165 Type 2 diabetes mellitus with hyperglycemia: Secondary | ICD-10-CM

## 2012-12-30 DIAGNOSIS — E1129 Type 2 diabetes mellitus with other diabetic kidney complication: Secondary | ICD-10-CM

## 2012-12-30 DIAGNOSIS — I5022 Chronic systolic (congestive) heart failure: Secondary | ICD-10-CM

## 2012-12-30 DIAGNOSIS — N189 Chronic kidney disease, unspecified: Secondary | ICD-10-CM

## 2012-12-30 DIAGNOSIS — I509 Heart failure, unspecified: Secondary | ICD-10-CM

## 2013-01-08 ENCOUNTER — Other Ambulatory Visit (HOSPITAL_COMMUNITY): Payer: Self-pay | Admitting: *Deleted

## 2013-01-08 DIAGNOSIS — I5022 Chronic systolic (congestive) heart failure: Secondary | ICD-10-CM

## 2013-01-08 MED ORDER — METOLAZONE 2.5 MG PO TABS: 2.5000 mg | ORAL_TABLET | ORAL | Status: DC

## 2013-01-15 ENCOUNTER — Telehealth: Payer: Self-pay | Admitting: Internal Medicine

## 2013-01-15 DIAGNOSIS — R911 Solitary pulmonary nodule: Secondary | ICD-10-CM

## 2013-01-15 NOTE — Telephone Encounter (Signed)
I called [name] to let him know we will be ordering a follow up CT chest without contrast as previously discussed based on nodule finding on prior imaging. He did not answer the phone and voicemail was left in regards to imaging being ordered and I will also ask RN to call patient again to confirm scheduling.

## 2013-01-15 NOTE — Telephone Encounter (Signed)
Message copied by Baltazar Apo on Mon Jan 15, 2013 10:11 AM ------      Message from: Baltazar Apo      Created: Mon Oct 09, 2012  7:04 PM       December follow up ct chest 3 months             ------

## 2013-01-19 ENCOUNTER — Ambulatory Visit (HOSPITAL_COMMUNITY): Payer: Medicare Other | Attending: Internal Medicine

## 2013-01-22 ENCOUNTER — Ambulatory Visit: Admitting: Internal Medicine

## 2013-01-23 ENCOUNTER — Telehealth: Payer: Self-pay | Admitting: *Deleted

## 2013-01-23 NOTE — Telephone Encounter (Signed)
Appt has been scheduled for CT Chest - next Tuesday 1/13 @ 0900am (arrive by 0845Am) and NPO x 4 hrs; pt called/informed.

## 2013-01-23 NOTE — Telephone Encounter (Signed)
Message copied by Hassan Buckler on Tue Jan 23, 2013 11:47 AM ------      Message from: Baltazar Apo      Created: Mon Jan 15, 2013 10:19 AM       Please schedule. Order placed. Confirm with patient. i tried calling but went to voicemail.                   ----- Message -----         From: Darden Palmer, MD         Sent: 01/08/2013           To: Darden Palmer, MD            December follow up ct chest 3 months                   ------

## 2013-01-30 ENCOUNTER — Ambulatory Visit (HOSPITAL_COMMUNITY)
Admission: RE | Admit: 2013-01-30 | Discharge: 2013-01-30 | Disposition: A | Discharge status: Home / Self Care | Payer: Medicare Other | Source: Ambulatory Visit | Attending: Internal Medicine | Admitting: Internal Medicine

## 2013-01-30 DIAGNOSIS — I517 Cardiomegaly: Secondary | ICD-10-CM | POA: Insufficient documentation

## 2013-01-30 DIAGNOSIS — R911 Solitary pulmonary nodule: Secondary | ICD-10-CM | POA: Insufficient documentation

## 2013-01-30 DIAGNOSIS — I519 Heart disease, unspecified: Secondary | ICD-10-CM | POA: Insufficient documentation

## 2013-01-30 DIAGNOSIS — R599 Enlarged lymph nodes, unspecified: Secondary | ICD-10-CM | POA: Insufficient documentation

## 2013-01-30 DIAGNOSIS — Z950 Presence of cardiac pacemaker: Secondary | ICD-10-CM | POA: Insufficient documentation

## 2013-01-30 DIAGNOSIS — E119 Type 2 diabetes mellitus without complications: Secondary | ICD-10-CM | POA: Insufficient documentation

## 2013-01-31 ENCOUNTER — Telehealth: Payer: Self-pay | Admitting: *Deleted

## 2013-01-31 NOTE — Telephone Encounter (Signed)
Pt called asking for the results of the Chest CT done yesterday. Pt # [id number]

## 2013-01-31 NOTE — Telephone Encounter (Signed)
Pt called to give report and no answer.  Message left asking him to call clinic and I will give him report.  Also I stated Dr Virgina Organ would call pt Friday and review results.

## 2013-02-01 ENCOUNTER — Telehealth (HOSPITAL_COMMUNITY): Payer: Self-pay

## 2013-02-01 NOTE — Telephone Encounter (Signed)
Patient returned call regarding earlier message left referring to weight gain, edema, and SOB.  Instructed to take extra dose of metolazone as prescribed and recheck weight tomorrow morning.  Patient understands and agreeable, will call us tomorrow to let us know if this helps or not.  Also made 6 week f/u appoint for 2/2 that was due. Ave Filter

## 2013-02-01 NOTE — Telephone Encounter (Signed)
[facility] RN called concerned of patient weight gain 219.6lb yesterday to 223.4lb today.  Per [facility] RN, patient c/o slight SOB, bilateral ankle and abdominal swelling, and LLL fine crackles.  No cough.  Attempted to call patient to follow up, no answer, left message, will attempt to call again later today. Ave Filter

## 2013-02-06 ENCOUNTER — Telehealth: Payer: Self-pay | Admitting: *Deleted

## 2013-02-06 ENCOUNTER — Encounter: Payer: Self-pay | Admitting: Licensed Clinical Social Worker

## 2013-02-06 ENCOUNTER — Telehealth: Payer: Self-pay | Admitting: Internal Medicine

## 2013-02-06 DIAGNOSIS — Z748 Other problems related to care provider dependency: Secondary | ICD-10-CM

## 2013-02-06 NOTE — Telephone Encounter (Signed)
I called [name] this morning to review results of his recent repeat CT scan.  The scan shows approximately 40% improvement of the R nodule, however it has not resolved and is recommended for another 6 month follow up scan for further characterization.  The nodule is below PET-CT size threshold.  Other findings are also suspicious for bronchitis or reactive airways disease, and stable mediastinal adenopathy. He is able to voice understanding of his results and agrees for 6 month follow up that will be in July.   He also endorses night time cramping in his lower extremities for a few hours that spontaneously resolves on its own after drinking water and walking around. He says it has been ongoing for ~2-3 months.  He does have a hx of hypokalemia and is on potassium supplementation but perhaps his level is still low? I have asked him to come to [facility] for further [facility] if possible whenever he can, however he says transportation is very limited.  He will discuss further with his home nursing and try to arrange an appointment to be seen.  He is encouraged to continue to follow up with heart failure team.

## 2013-02-06 NOTE — Telephone Encounter (Signed)
Order placed

## 2013-02-06 NOTE — Telephone Encounter (Signed)
CSW spoke with [name], pt agreeable for CSW to mail bus passes to him for transportation to appointment.  CSW will ask for [facility] CSW to assist pt with initiate SCAT application for transportation.

## 2013-02-06 NOTE — Addendum Note (Signed)
Addended by: Baltazar Apo on: 02/06/2013 05:29 PM   Modules accepted: Orders

## 2013-02-06 NOTE — Telephone Encounter (Signed)
Call from [facility] with [facility] - # [phone]  Nurse reports that pt is have trouble sleeping at night. He also is c/o leg pain, his feet cramp up at night and it hurts to walk.  Dr Virgina Organ talked with pt today and advised an OV. I left message with CSW to see if she could help with transportation for pt.

## 2013-02-15 ENCOUNTER — Ambulatory Visit (HOSPITAL_COMMUNITY)
Admission: RE | Admit: 2013-02-15 | Discharge: 2013-02-15 | Disposition: A | Discharge status: Home / Self Care | Payer: Medicare Other | Source: Ambulatory Visit | Attending: Internal Medicine | Admitting: Internal Medicine

## 2013-02-15 ENCOUNTER — Encounter: Payer: Self-pay | Admitting: Internal Medicine

## 2013-02-15 ENCOUNTER — Inpatient Hospital Stay
Admission: RE | Admit: 2013-02-15 | Discharge: 2013-02-15 | Disposition: A | Discharge status: Home / Self Care | Payer: Self-pay | Source: Ambulatory Visit | Attending: Internal Medicine | Admitting: Internal Medicine

## 2013-02-15 ENCOUNTER — Ambulatory Visit (INDEPENDENT_AMBULATORY_CARE_PROVIDER_SITE_OTHER): Payer: Medicare Other | Admitting: Internal Medicine

## 2013-02-15 VITALS — BP 112/74 | HR 96 | Temp 98.2°F | Ht 72.0 in | Wt 214.4 lb

## 2013-02-15 DIAGNOSIS — E1129 Type 2 diabetes mellitus with other diabetic kidney complication: Secondary | ICD-10-CM

## 2013-02-15 DIAGNOSIS — J209 Acute bronchitis, unspecified: Secondary | ICD-10-CM

## 2013-02-15 DIAGNOSIS — E119 Type 2 diabetes mellitus without complications: Secondary | ICD-10-CM

## 2013-02-15 DIAGNOSIS — J189 Pneumonia, unspecified organism: Secondary | ICD-10-CM

## 2013-02-15 DIAGNOSIS — R21 Rash and other nonspecific skin eruption: Secondary | ICD-10-CM | POA: Insufficient documentation

## 2013-02-15 DIAGNOSIS — I5022 Chronic systolic (congestive) heart failure: Secondary | ICD-10-CM

## 2013-02-15 DIAGNOSIS — R059 Cough, unspecified: Secondary | ICD-10-CM

## 2013-02-15 DIAGNOSIS — F32A Depression, unspecified: Secondary | ICD-10-CM

## 2013-02-15 DIAGNOSIS — R05 Cough: Secondary | ICD-10-CM

## 2013-02-15 DIAGNOSIS — F329 Major depressive disorder, single episode, unspecified: Secondary | ICD-10-CM

## 2013-02-15 LAB — GLUCOSE, CAPILLARY: Glucose-Capillary: 231 mg/dL — ABNORMAL HIGH (ref 70–99)

## 2013-02-15 LAB — POCT GLYCOSYLATED HEMOGLOBIN (HGB A1C): Hemoglobin A1C: 9.4

## 2013-02-15 MED ORDER — KETOCONAZOLE 2 % EX CREA: 1.0000 "application " | TOPICAL_CREAM | Freq: Every day | CUTANEOUS | Status: DC

## 2013-02-15 MED ORDER — DEXTROMETHORPHAN-GUAIFENESIN 10-100 MG/5ML PO LIQD: 5.0000 mL | ORAL | Status: DC | PRN

## 2013-02-15 MED ORDER — ALBUTEROL SULFATE (2.5 MG/3ML) 0.083% IN NEBU
2.5000 mg | INHALATION_SOLUTION | Freq: Once | RESPIRATORY_TRACT | Status: AC
  Administered 2013-02-15: 2.5 mg via RESPIRATORY_TRACT

## 2013-02-15 MED ORDER — IPRATROPIUM BROMIDE 0.02 % IN SOLN
0.5000 mg | Freq: Once | RESPIRATORY_TRACT | Status: AC
  Administered 2013-02-15: 0.5 mg via RESPIRATORY_TRACT

## 2013-02-15 MED ORDER — LEVOFLOXACIN 750 MG PO TABS: 750.0000 mg | ORAL_TABLET | Freq: Every day | ORAL | Status: AC

## 2013-02-15 NOTE — Assessment & Plan Note (Signed)
This has been an ongoing problem since 10/14 where it was documented. Per chart review this was being managed with OTC hydrocortisone cream but the pt has noticed progression of the rash. One lesion on the left upper arm has well demarcated area similar to tinea cutis. The flaking nature of the rash on the back include differential of psoriasis, pityriases rosacea, contact dermatitis given prutic quality, or eczema. It would not help to get ck-mb in the setting of the patients poor cardiac and renal function and pretest probability is very low.  -ketoconazole cream -dermatology referral

## 2013-02-15 NOTE — Assessment & Plan Note (Addendum)
Pt had recent CT chest for lung nodule follow up that showed no progression of nodule. Pt having some wheezing on PE and then duoneb was provided in clinic where crackles were heard.  -stat CXR (pt left before final read was performed by radiology) when compared to most recent CT on 01/30/13 appears to have some curly b lines and haziness in LLL. Pt wt is stable and no other signs of fluid overload to suggest CHF exacerbation.  Wt Readings from Last 3 Encounters:  02/15/13 214 lb 6.4 oz (97.251 kg)  12/11/12 214 lb 1.9 oz (97.124 kg)  11/09/12 221 lb 12.8 oz (100.608 kg)   -given appearance per my interpretation of slight hazziness to suggest possible early PNA will treat for CAP given last access to healthcare system was in 10/14.  -levofloxacin 750mg  x 7 days -tussin dm cough syrup -follow up if symptoms don't improve and pt was educated on warning symptoms that would need immediate medical attention.   Addendum: final CXR read with no frank consolidation but increased bronchitic changes and edema. This may indicate a bronchitis which would follow similar treatment for presumed CAP.

## 2013-02-15 NOTE — Progress Notes (Signed)
Subjective:    Patient ID: [name], male    DOB: [birth year], 48 y.o.   MRN: [id number]  HPI [name] is a 48 yo man pmh as listed below presents for acute URI symptoms.   Pt states that symptoms have been going on for 2 wks including cough that was at first productive and now dry, chills, fatigue, myalgias, and arthralgias. Pts children had been sick with similar symptoms. He is still able to keep adequate PO intake and function with baseline fatigue and baseline sob/doe. No new CP, HA or LE edema.   Pt has an ongoing rash for the past 4 months that he has been treating with OTC hydrocortisone cream with little relief. Pt has also had some progression of the rash up his arm pits across his chest and now by his back. It is pruritic that is unrelieved with the cream the pt denied any new lotions, creams, laundry detergent, or new environment that could be aggravating the rash. The patient wasn't able to make the last dermatology visit given a recent move. The pt states that no family members have had a rash or skin disease and he himself never had skin disorder before. Pt doesn't have liver disease.   Review of Systems  Constitutional: Positive for chills and fatigue. Negative for fever and diaphoresis.  HENT: Positive for congestion and postnasal drip. Negative for sinus pressure, sneezing, sore throat and tinnitus.   Eyes: Negative for visual disturbance.  Respiratory: Positive for cough and shortness of breath. Negative for chest tightness and wheezing.   Cardiovascular: Negative for chest pain, palpitations and leg swelling.  Gastrointestinal: Negative for nausea, vomiting, abdominal pain, diarrhea, constipation and abdominal distention.  Genitourinary: Negative for difficulty urinating.  Musculoskeletal: Positive for arthralgias and myalgias.  Skin: Positive for rash.  Neurological: Negative for dizziness, weakness, light-headedness and headaches.    Past Medical History    Diagnosis Date  . Hypertension   . CHF (congestive heart failure)     a. 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 b. EF 20-25%, s/p St. Jude AICD placement 04/2007. c. Echo 06/2012: EF 10%.  . History of peptic ulcer disease     EGD 09/2007 shows prox gastric ulcer with black eschar, treated with PPI  . Depression   . History of alcohol abuse     quit approx 2011-2012  . OSA (obstructive sleep apnea)     Intolerant of CPAP  . ICD (implantable cardiac defibrillator) in place   . Diabetes mellitus     insulin dependent  . GERD (gastroesophageal reflux disease)   . Headache(784.0)   . Myasthenia gravis 10/12/2011    Diagnosed in 09/2011. Received Plasmapheresis. CT chest no Thymoma. Acetylcholine receptor antibody negative.    . CKD (chronic kidney disease) stage 3, GFR 30-59 ml/min   . Polysubstance abuse     Including cocaine (+ UDS multiple times in 2014)  . PUD (peptic ulcer disease)   . Pulmonary nodule     a. CT 09/2011.  Marland Kitchen. CAD (coronary artery disease)     a. NSTEMI 06/2012: cath showed subtotal thrombotic occlusion of the distal OM1, too small for PCI, ?cocaine-triggered plaque rupture with thrombosis (no LV thrombus on f/u echo).    Social, surgical, family history reviewed with patient and updated in appropriate chart locations.     Objective:   Physical Exam  Skin:      Filed Vitals:   02/15/13 0957  BP: 112/74  Pulse: 96  Temp: 98.2 F (36.8 C)   General: sitting in chair, tired, chronically ill appearing, nad HEENT: PERRL, EOMI, no scleral icterus, clear pnd, TM with clear fluid behind no frank pus or erythema, no pinnae tenderness Cardiac: RRR, no rubs, 2/3 systolic murmur LLSB w/o radiation, no  gallops Pulm: inspiratory and expiratory wheezes before neb, after neb some LLL crackles and diffuse expiratory wheezes, moving normal volumes of air Abd: soft, nontender, nondistended, BS present Ext: warm and well perfused, no pedal edema, acanthosis  nigricans around neck, diffuse healing pustules on UE bilaterally, excoriations and dry flaking skin tracking up arms bilaterally, some hyperpigmented papules non-blanching that extend through t4 dermatome, similar hyperpigmented papules that consolidate along the center of back with flaking Neuro: alert and oriented X3, cranial nerves II-XII grossly intact    Assessment & Plan:  Please see problem oriented charting  Pt discussed with Dr. Rogelia Boga

## 2013-02-15 NOTE — Patient Instructions (Signed)
Pneumonia, Adult °Pneumonia is an infection of the lungs. It may be caused by a germ (virus or bacteria). Some types of pneumonia can spread easily from person to person. This can happen when you cough or sneeze. °HOME CARE °· Only take medicine as told by your doctor. °· Take your medicine (antibiotics) as told. Finish it even if you start to feel better. °· Do not smoke. °· You may use a vaporizer or humidifier in your room. This can help loosen thick spit (mucus). °· Sleep so you are almost sitting up (semi-upright). This helps reduce coughing. °· Rest. °A shot (vaccine) can help prevent pneumonia. Shots are often advised for: °· People over 65 years old. °· Patients on chemotherapy. °· People with long-term (chronic) lung problems. °· People with immune system problems. °GET HELP RIGHT AWAY IF:  °· You are getting worse. °· You cannot control your cough, and you are losing sleep. °· You cough up blood. °· Your pain gets worse, even with medicine. °· You have a fever. °· Any of your problems are getting worse, not better. °· You have shortness of breath or chest pain. °MAKE SURE YOU:  °· Understand these instructions. °· Will watch your condition. °· Will get help right away if you are not doing well or get worse. °Document Released: 06/23/2007 Document Revised: 03/29/2011 Document Reviewed: 03/27/2010 °Exit[employer]® Patient Information ©2014 [employer]. ° °

## 2013-02-19 ENCOUNTER — Ambulatory Visit (HOSPITAL_COMMUNITY)
Admission: RE | Admit: 2013-02-19 | Discharge: 2013-02-19 | Disposition: A | Discharge status: Home / Self Care | Payer: Medicare Other | Source: Ambulatory Visit | Attending: Internal Medicine | Admitting: Internal Medicine

## 2013-02-19 ENCOUNTER — Encounter (HOSPITAL_COMMUNITY): Payer: Self-pay

## 2013-02-19 ENCOUNTER — Encounter: Payer: Self-pay | Admitting: Licensed Clinical Social Worker

## 2013-02-19 ENCOUNTER — Other Ambulatory Visit (HOSPITAL_COMMUNITY): Payer: Self-pay

## 2013-02-19 VITALS — BP 132/62 | HR 124 | Resp 18 | Wt 214.5 lb

## 2013-02-19 DIAGNOSIS — I5022 Chronic systolic (congestive) heart failure: Secondary | ICD-10-CM

## 2013-02-19 DIAGNOSIS — I129 Hypertensive chronic kidney disease with stage 1 through stage 4 chronic kidney disease, or unspecified chronic kidney disease: Secondary | ICD-10-CM | POA: Insufficient documentation

## 2013-02-19 DIAGNOSIS — I2510 Atherosclerotic heart disease of native coronary artery without angina pectoris: Secondary | ICD-10-CM | POA: Insufficient documentation

## 2013-02-19 DIAGNOSIS — N183 Chronic kidney disease, stage 3 unspecified: Secondary | ICD-10-CM | POA: Insufficient documentation

## 2013-02-19 DIAGNOSIS — I509 Heart failure, unspecified: Secondary | ICD-10-CM | POA: Insufficient documentation

## 2013-02-19 LAB — BASIC METABOLIC PANEL
BUN: 49 mg/dL — ABNORMAL HIGH (ref 6–23)
CALCIUM: 9 mg/dL (ref 8.4–10.5)
CHLORIDE: 90 meq/L — AB (ref 96–112)
CO2: 25 meq/L (ref 19–32)
Creatinine, Ser: 2.07 mg/dL — ABNORMAL HIGH (ref 0.50–1.35)
GFR calc Af Amer: 43 mL/min — ABNORMAL LOW (ref >90)
GFR calc non Af Amer: 37 mL/min — ABNORMAL LOW (ref >90)
Glucose, Bld: 376 mg/dL — ABNORMAL HIGH (ref 70–99)
Potassium: 4.1 mEq/L (ref 3.7–5.3)
SODIUM: 134 meq/L — AB (ref 137–147)

## 2013-02-19 LAB — DIGOXIN LEVEL: DIGOXIN LVL: 0.4 ng/mL — AB (ref 0.8–2.0)

## 2013-02-19 MED ORDER — CARVEDILOL 3.125 MG PO TABS
3.1250 mg | ORAL_TABLET | Freq: Two times a day (BID) | ORAL | Status: DC
Start: 2013-02-19 — End: 2013-04-10

## 2013-02-19 MED ORDER — METOLAZONE 2.5 MG PO TABS: 2.5000 mg | ORAL_TABLET | ORAL | Status: DC

## 2013-02-19 NOTE — Patient Instructions (Signed)
Follow up 1 month  Take metolazone 2.5 mg Mon, Wed, Sat  Take carvedilol 3.125 mg twice a day  Do the following things EVERYDAY: 1) Weigh yourself in the morning before breakfast. Write it down and keep it in a log. 2) Take your medicines as prescribed 3) Eat low salt foods-Limit salt (sodium) to 2000 mg per day.  4) Stay as active as you can everyday 5) Limit all fluids for the day to less than 2 liters

## 2013-02-19 NOTE — Progress Notes (Signed)
I saw and evaluated the patient.  I personally confirmed the key portions of the history and exam documented by Dr. Sadek and I reviewed pertinent patient test results.  The assessment, diagnosis, and plan were formulated together and I agree with the documentation in the resident's note. 

## 2013-02-19 NOTE — Progress Notes (Signed)
CSW met with [name] in the clinic to assist with support and community resources. [name] reports that he resides in an apartment in [place] (housing program through [employer])  with his 3 children (ages 31, 10 and 84). He reports that his wife is "strung out on crack cocaine" and is not involved currently with raising the children. He states that he is on SSD and receives benefits from SS from all three children. He states that he is on Medicare and has Medicaid for the children. Patient states that he has heart failure, HTN and DM2 and has difficulty ambulating and "getting around cause I get short of breath". He spoke of signs and symptoms of heart failure and states that he is compliant with medications and medical regimen. He states that his sister [name] (lives in [place]) is his healthcare POA and he has signed documents for her to have "custody of the children if I pass". He also reports that [facility] visits monthly and has telemonitoring at home. [name] appears to have a good understanding of his disease and has the neccessary planning in place should he be hospitalized or no longer be able to care for his children. Patient stated that he has difficulty with transportation due to his decline and inability to ambulate long distances. CSW assisted patient with a SCAT application and faxed ([phone]) to Katheren Puller @ [facility] for face to face interview for transportation assistance. CSW will continue to be available as needed. Raquel Sarna, [place]

## 2013-02-19 NOTE — Progress Notes (Signed)
Patient ID: [name], male   DOB: Sep 19, 1965, 48 y.o.   MRN: [id number] Psychiatrist: Dr Anthony Sar PCP: Dr Loistine Chance  HPI: Mr. Matton is a 48 y/o male with HTN and DM2 with longstanding HF due to NICM (probably ETOH), EF 20%. Current cocaine abuse. Cath in 2008 showed left circumflex with 40% stenosis. Baseline weight about 210. Was previously on [facility] but this ended in December in 2013 as he was doing well.   Admitted 03/2011 with NYHA Class IV symptoms, requiring milrinone therapy and IV lasix. Cath 04/13/11: cardiogenic shock with biventricular failure (R>L) and possible restrictive physiology. RA = 22 with steep y-descents, RV = 45/12/25, PA = 51/25 (34), PCW = 18-20, Fick cardiac output/index = 3.1/1.4, Thermo CO/CI = 3.6/1.6, PVR = 4.8 Woods O2 sat = 85% (after sedation), PA sat = 31%, 36%. Milrinone was initiated for low output physiology.   Admitted to [facility] 06/2012 witih CP/elevated troponin and increased SOB. He had a +USD for cocaine. He also had LHC 07/17/12 showing subtotal thrombotic occlusion of the distal OM1. It was too small at this point for PCI and was started on plavix.  Echo showed EF 10% with no LV thrombus and moderately decreased RV systolic function.  His d/c weight was 206 and now 228 lbs, though this is lower than 230 which was his weight at last appointment in this office.  Admitted to [facility] 08/04/12 through 08/09/12 NSTEMI thought to be from non st elevation MI from cocaine induced vasospasm and volume overload. + cocaine. Dry weight 205-208 pounds. Discharge weight 208 pounds.   He returns for follow up. Last visit digoxin, hydralazine, imdur added. Last week he was started on antibiotics for pneumonia but he was unable to pick up his antibiotics but he plans to pick up today. Has all of his medications. Weight at home 206-214 pounds. Says he has not used alcohol or drugs in 3 months. Says he goes through at 10 pound bag of ice in 2 days. Followed by [facility] with tele monitoring.    ECG: sinus tachy at 114, incomplete LBBB, nonspecific T wave flattening  Labs  7/14: K 4.8, creatinine 2 08/09/12 Potassium 4.2 Creatinine 2.0 8/14: K 4, creatinine 1.6, digoxin 0.8 11/02/12: K+ 3.3, Creatinine 1.89, cholesterol 98 12/11/12 K 3.1 Creatinine 2.26 Pro BNP 2957   Spironolactone stopped due to hyperkalemia. Benazepril and metoprolol stopped due to hypotension.  SH: Lives in low income apartment. 3 children, lives in [place].  + cocaine abuse until recently, says he has quit.  Quit smoking.  FH: No premature CAD.  ROS: All systems negative except as listed in HPI, PMH and Problem List.  Past Medical History  Diagnosis Date  . Hypertension   . CHF (congestive heart failure)     a. 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 b. EF 20-25%, s/p [employer] AICD placement 04/2007. c. Echo 06/2012: EF 10%.  . History of peptic ulcer disease     EGD 09/2007 shows prox gastric ulcer with black eschar, treated with PPI  . Depression   . History of alcohol abuse     quit approx 2011-2012  . OSA (obstructive sleep apnea)     Intolerant of CPAP  . ICD (implantable cardiac defibrillator) in place   . Diabetes mellitus     insulin dependent  . GERD (gastroesophageal reflux disease)   . Headache(784.0)   . Myasthenia gravis 10/12/2011    Diagnosed in 09/2011. Received Plasmapheresis. CT chest no  Thymoma. Acetylcholine receptor antibody negative.    . CKD (chronic kidney disease) stage 3, GFR 30-59 ml/min   . Polysubstance abuse     Including cocaine (+ UDS multiple times in 2014)  . PUD (peptic ulcer disease)   . Pulmonary nodule     a. CT 09/2011.  Marland Kitchen. CAD (coronary artery disease)     a. NSTEMI 06/2012: cath showed subtotal thrombotic occlusion of the distal OM1, too small for PCI, ?cocaine-triggered plaque rupture with thrombosis (no LV thrombus on f/u echo).   - NSTEMI (6/14) with subtotal thrombotic occlusion of a distal OM1, too small for intervention.  No LV  thrombus on echo.   Current Outpatient Prescriptions  Medication Sig Dispense Refill  . aspirin EC 81 MG tablet Take 1 tablet (81 mg total) by mouth daily.  30 tablet  6  . clindamycin (CLEOCIN) 150 MG capsule Take 150 mg by mouth.      . clopidogrel (PLAVIX) 75 MG tablet Take 1 tablet (75 mg total) by mouth daily with breakfast.  30 tablet  11  . clorazepate (TRANXENE) 7.5 MG tablet Take 1 tablet (7.5 mg total) by mouth daily. Take three tablets by mouth three times a day (1 in the morning and 2 in the evening): For anxiety  30 tablet  3  . colchicine 0.6 MG tablet Take 1 tablet (0.6 mg total) by mouth 2 (two) times daily.  20 tablet  0  . dextromethorphan-guaiFENesin (MUCINEX DM) 30-600 MG per 12 hr tablet Take 1 tablet by mouth every 12 (twelve) hours.      Marland Kitchen. dextromethorphan-guaiFENesin (TUSSIN DM) 10-100 MG/5ML liquid Take 5 mLs by mouth every 4 (four) hours as needed for cough.  180 mL  0  . digoxin (LANOXIN) 0.125 MG tablet Take 1 tablet (0.125 mg total) by mouth daily. For heart  30 tablet  5  . hydrALAZINE (APRESOLINE) 25 MG tablet Take 1.5 tablets (37.5 mg total) by mouth every 8 (eight) hours.  30 tablet  0  . hydrocortisone cream 1 % Apply topically 2 (two) times daily.  30 g  0  . hydrOXYzine (ATARAX/VISTARIL) 25 MG tablet Take 50 mg by mouth at bedtime. Take 1 or 2 tablets by mouth 3 times daily as needed for itching.(Max of 6 tablets a day): For anxiety/itching      . insulin glargine (LANTUS) 100 UNIT/ML injection Inject 0.15 mLs (15 Units total) into the skin at bedtime.  10 mL  5  . isosorbide mononitrate (IMDUR) 60 MG 24 hr tablet Take 1 tablet (60 mg total) by mouth daily. For chronic chest pains      . ketoconazole (NIZORAL) 2 % cream Apply 1 application topically daily.  60 g  0  . levofloxacin (LEVAQUIN) 750 MG tablet Take 1 tablet (750 mg total) by mouth daily.  7 tablet  0  . metolazone (ZAROXOLYN) 2.5 MG tablet Take 1 tablet (2.5 mg total) by mouth every Wednesday and  Saturday. And as needed  15 tablet  6  . omeprazole (PRILOSEC) 20 MG capsule Take 20 mg by mouth daily.      Marland Kitchen. oxyCODONE (ROXICODONE) 5 MG immediate release tablet Take 1 tablet (5 mg total) by mouth every 4 (four) hours as needed for pain.  15 tablet  0  . potassium chloride SA (K-DUR,KLOR-CON) 20 MEQ tablet Take 2 tablets (40 mEq total) by mouth daily.  60 tablet  6  . ranitidine (ZANTAC) 150 MG tablet Take 1 tablet (150 mg  total) by mouth daily.  60 tablet  3  . rosuvastatin (CRESTOR) 10 MG tablet Take 1 tablet (10 mg total) by mouth daily. For control of high cholesterol      . torsemide (DEMADEX) 20 MG tablet Take 6 tablets (120 mg total) by mouth daily with breakfast. For control of high blood pressure/sewllings.  180 tablet  3  . traMADol (ULTRAM) 50 MG tablet Take 1 tablet (50 mg total) by mouth every 6 (six) hours as needed for pain.  12 tablet  0  . [DISCONTINUED] metoprolol tartrate (LOPRESSOR) 25 MG tablet Take 1 tablet (25 mg total) by mouth 2 (two) times daily.  60 tablet  1  . [DISCONTINUED] traZODone (DESYREL) 25 mg TABS Take 0.5 tablets (25 mg total) by mouth at bedtime.  30 tablet  1   No current facility-administered medications for this encounter.   PHYSICAL EXAM: Filed Vitals:   02/19/13 0900  BP: 132/62  Pulse: 124  Resp: 18  Weight: 214 lb 8 oz (97.297 kg)  SpO2: 98%   General:  Chronically ill appearing.  No resp difficulty;  HEENT: normal Neck: supple. JVP 10-12. Carotids 2+ bilaterally; no bruits. No lymphadenopathy or thryomegaly appreciated. Cor: PMI normal. Regular rate & rhythm. 2/6 systolic murmur LLSB Lungs: clear Abdomen: obese, soft, nontender, mildly distended. No hepatosplenomegaly. No bruits or masses. Good bowel sounds. Extremities: no cyanosis, clubbing, rash.  1+ ankle edema. Neuro: alert & orientedx3, cranial nerves grossly intact. Moves all 4 extremities w/o difficulty. Affect pleasant.  ASSESSMENT & PLAN: 1. CHF: Chronic systolic CHF,  nonischemic cardiomyopathy.  Possibly related to cocaine abuse and prior ETOH abuse. NYHA IIIB. Weight has been stable but he still has some volume overload on exam.  - Continue torsemide 120 mg daily and increase metolazone to 2.5 mg prior to torsemide on Monday -Wednesdays- Saturdays. Continue daily KCL to 40 meq. - He has not used cocaine in 3 months and is checked for cocaine in urine where he lives and by PCP.  Add 3.125 carvedilol twice a day (warned about risk of interaction with cocaine).  - Continue digoxin 0.125 mg, hydralazine 37.5 mg TID, and IMDUR 60 mg daily.   - No ACEI /spiro with CKD    - Reinforced low salt food choices, limiting fluid intake and medication compliance.  - Check BMET and digoxin level today.  2. CAD: Thrombotic occlusion of distal OM1 noted on cath in 6/14 after recent presentation with NSTEMI (too small caliber for intervention). Possible cocaine-induced plaque rupture.  Continue Plavix and ASA 81 for medical management of NSTEMI.   3. Cocaine abuse: Says he is not used cocaine in 3 months.  He is checked at the place he lives and by PCP.  4. CKD, stage III: Not on ACE-I or Spiro. Will check BMET today.  Follow up in 1 month   CLEGG,AMY NP-C 9:02 AM  Patient seen with NP, agree with the above note.  He still has some volume overload on exam but weight has been stable.  Does well on days he takes metolazone, gets short of breath on days in between.  - Increase metolazone to three times a week.  Will need to follow K and creatinine closely with this change.  BMET today and in 2 wks.  - We talked at length about his cocaine use.  He has quit and is getting urine drug screens.  Will start low dose Coreg, 3.125 mg bid.  - Followup in 1 month.  [name] 02/19/2013 9:31 AM

## 2013-02-20 NOTE — Addendum Note (Signed)
Encounter addended by: Ernestina Penna, CCT on: 02/20/2013  9:12 AM<BR>     Documentation filed: Charges VN

## 2013-02-28 ENCOUNTER — Telehealth: Payer: Self-pay | Admitting: *Deleted

## 2013-02-28 DIAGNOSIS — Z598 Other problems related to housing and economic circumstances: Secondary | ICD-10-CM

## 2013-02-28 DIAGNOSIS — I5022 Chronic systolic (congestive) heart failure: Secondary | ICD-10-CM

## 2013-02-28 DIAGNOSIS — Z599 Problem related to housing and economic circumstances, unspecified: Secondary | ICD-10-CM

## 2013-02-28 DIAGNOSIS — N189 Chronic kidney disease, unspecified: Secondary | ICD-10-CM

## 2013-02-28 DIAGNOSIS — I509 Heart failure, unspecified: Secondary | ICD-10-CM

## 2013-02-28 DIAGNOSIS — E1129 Type 2 diabetes mellitus with other diabetic kidney complication: Secondary | ICD-10-CM

## 2013-02-28 DIAGNOSIS — E1165 Type 2 diabetes mellitus with hyperglycemia: Secondary | ICD-10-CM

## 2013-02-28 NOTE — Telephone Encounter (Signed)
Given his multiple co-morbidities, this will need to be closely assessed in opc and then decided on treatment. Prior to this, he was not using cpap and said that was why he was having trouble with sleep, but I think CPAP has now been assessed and started. Please have social work follow up and if he likely needs opc appointment please schedule with next available person when he can come.

## 2013-02-28 NOTE — Telephone Encounter (Signed)
Call from [facility] from [facility] - states pt is depressed and having trouble sleeping at night. Requesting anti-anxiety medication; taking care of 3 children is becoming difficult. Also he wants to talk to [facility] - he's unable to afford his medications. His telephone# is [phone]; informed Edson Snowball will be back in the morning. Barbara's telephone# [phone].

## 2013-03-01 ENCOUNTER — Encounter: Payer: Self-pay | Admitting: Licensed Clinical Social Worker

## 2013-03-01 ENCOUNTER — Telehealth: Payer: Self-pay | Admitting: *Deleted

## 2013-03-01 ENCOUNTER — Telehealth: Payer: Self-pay | Admitting: Licensed Clinical Social Worker

## 2013-03-01 NOTE — Telephone Encounter (Signed)
Call from [place], [facility] Case Manager - states pt's medications: fluid pill, BP pill, abx, and Torsemide all were covered by [facility] pharmacy and be delivered by 1400PM today.

## 2013-03-01 NOTE — Telephone Encounter (Signed)
Confirmed with [facility] nurse has ordered new mask for patient.  Please explore adding on [facility] SW for Mr. Clemensen.  Mr. Oliveria would benefit from [facility] SW for depression/anxiety.  Hopeful Mr. Dejoie will accept [facility] services as they will address transportation while [facility] application is being processed. In addition to assist pt with Medicare medication co-payments.  Most likely pt will not have funds for psychiatry referral.

## 2013-03-01 NOTE — Telephone Encounter (Signed)
Pt is current with [facility] for [facility] RN.  If you place the order for [facility] SW, I will notify [facility] today to add on service.

## 2013-03-01 NOTE — Telephone Encounter (Signed)
Thank you shana.   What do i add for the face to face for home health for him. Not sure if he is home bound and if lack of transportation qualifies.

## 2013-03-01 NOTE — Telephone Encounter (Signed)
Talked to pt - stated he has no transportation at this time to schedule an appt but the [facility] nurse has been there. He stated he has been wearing his CPAP but the mask has been leaking and they suppose to be bringing another one; and the  [facility] nurse is aware also.

## 2013-03-01 NOTE — Telephone Encounter (Signed)
CSW placed call to [name] this morning.  Pt states he currently needs assistance with medications co-payments.  Pt has not pick up his medications due to limited funds.  [name] states he has applied for the ExtraHelp program but has yet to hear determination.  CSW placed call to [facility], currently cost for all pt's medications would be $62.87.   CSW discussed case with Val Eagle, [facility] SW.  [facility] Clinic able to assist [name] with metolazone, Coreg, and torsemide.  This CSW inquiring if [facility] can assist with levofloxacin. Antibiotic has not been picked up according to pharmacy.

## 2013-03-01 NOTE — Addendum Note (Signed)
Addended by: Baltazar Apo on: 03/01/2013 03:43 PM   Modules accepted: Orders

## 2013-03-01 NOTE — Telephone Encounter (Signed)
CSW placed call to [name].  Pt aware of medication assistance through the [facility] Clinic.  CSW notified [name] currently under consideration for [facility] to assist with cost of Levaquin.  This medication was prescribed on 02/15/2013 but never picked up due to cost.  This is an isolated consideration from [facility].  Pt states he is able to utilize the bus at times.  CSW informed [name], CSW will send 4 bus passes in the mail for pt to schedule appointment with [facility].

## 2013-03-02 ENCOUNTER — Telehealth: Payer: Self-pay | Admitting: Licensed Clinical Social Worker

## 2013-03-02 NOTE — Telephone Encounter (Signed)
CSW referred to assist patient with medication assistance for HF medications. CSW contacted patient and pharmacy to confirm cost and made arrangements for payment and delivery. CSW will follow up with patient on next visit to further discuss medication assistance and resources.Patient verbalizes understanding and also stated that he has not heard from [employer] about in person interview. CSW will follow up with [employer]. Lasandra Beech, LCSW [phone]

## 2013-03-02 NOTE — Telephone Encounter (Signed)
CSW contacted patient to follow up to make sure medications were delivered yesterday. Patient confirmed receipt of medications. CSW also informed patient that [employer] application was received and he should be hearing from [place] to schedule in person interview shortly.  Patient requested assistance with food pantry as he states he is low on food and does not get food stamps until next week. CSW provided food banks in area for patient to follow up. Patient verbalizes understanding of phone call and will follow up with CSW if further needs arise. Lasandra Beech, LCSW [phone]

## 2013-03-05 ENCOUNTER — Telehealth: Payer: Self-pay | Admitting: Licensed Clinical Social Worker

## 2013-03-05 NOTE — Telephone Encounter (Signed)
[name] placed call to CSW on 2/13 at 0839.  CSW was out of the office.  CSW placed called to pt.  CSW left message requesting return call. CSW provided contact hours and phone number.

## 2013-03-07 NOTE — Telephone Encounter (Signed)
CSW received email from [facility] liaison.  Mr. Drain has had previous home health SW to address his current issues.  At this time [facility] SW unable to accept referral, as current issue have already been addressed.  HH SW states pt is already receiving Medicare ExtraHelp program.  CSW will review potential manufacturer programs which Mr. Byram may be eligible.

## 2013-03-08 ENCOUNTER — Encounter: Payer: Self-pay | Admitting: Licensed Clinical Social Worker

## 2013-03-08 NOTE — Telephone Encounter (Signed)
There are four manufacturer program that pt may be eligible for to assist with Crestor, Potassium Chloride, Vistaril, and Ultram. CSW left message on pt's voicemail.  Letter mailed.

## 2013-03-26 ENCOUNTER — Encounter (HOSPITAL_COMMUNITY): Payer: Medicare Other

## 2013-03-28 ENCOUNTER — Other Ambulatory Visit (HOSPITAL_COMMUNITY): Payer: Self-pay

## 2013-03-28 ENCOUNTER — Telehealth (HOSPITAL_COMMUNITY): Payer: Self-pay

## 2013-03-28 DIAGNOSIS — I5022 Chronic systolic (congestive) heart failure: Secondary | ICD-10-CM

## 2013-03-28 MED ORDER — METOLAZONE 2.5 MG PO TABS: 2.5000 mg | ORAL_TABLET | ORAL | Status: DC

## 2013-03-28 NOTE — Telephone Encounter (Signed)
Called patient concerning increasing weight per [facility] electronic trend sheet faxed in.  Weight 03/20/13 was 203 lbs, weight 03/27/13 214.2 lbs.  Per patient, he missed two doses of metolazone last week and needed a refill.  Rx sent to preferred pharmacy.  States he took it this week and today's weight was back down to 209 lb. Patient states he feels okay and has no other s/s.  Reminded of upcoming appointment.

## 2013-04-04 ENCOUNTER — Inpatient Hospital Stay (HOSPITAL_COMMUNITY): Admission: RE | Admit: 2013-04-04 | Payer: Medicare Other | Source: Ambulatory Visit

## 2013-04-04 ENCOUNTER — Encounter (HOSPITAL_COMMUNITY): Payer: Self-pay

## 2013-04-09 ENCOUNTER — Ambulatory Visit: Payer: Medicare Other | Admitting: Internal Medicine

## 2013-04-10 ENCOUNTER — Emergency Department (HOSPITAL_COMMUNITY): Payer: Medicare Other

## 2013-04-10 ENCOUNTER — Telehealth (HOSPITAL_COMMUNITY): Payer: Self-pay

## 2013-04-10 ENCOUNTER — Inpatient Hospital Stay (HOSPITAL_COMMUNITY)
Admission: EM | Admit: 2013-04-10 | Discharge: 2013-04-16 | DRG: 194 | Disposition: A | Discharge status: Home / Self Care | Payer: Medicare Other | Attending: Internal Medicine | Admitting: Internal Medicine

## 2013-04-10 ENCOUNTER — Encounter (HOSPITAL_COMMUNITY): Payer: Self-pay | Admitting: Emergency Medicine

## 2013-04-10 DIAGNOSIS — I252 Old myocardial infarction: Secondary | ICD-10-CM

## 2013-04-10 DIAGNOSIS — I2510 Atherosclerotic heart disease of native coronary artery without angina pectoris: Secondary | ICD-10-CM | POA: Diagnosis present

## 2013-04-10 DIAGNOSIS — K5900 Constipation, unspecified: Secondary | ICD-10-CM | POA: Diagnosis present

## 2013-04-10 DIAGNOSIS — E785 Hyperlipidemia, unspecified: Secondary | ICD-10-CM | POA: Diagnosis present

## 2013-04-10 DIAGNOSIS — E1129 Type 2 diabetes mellitus with other diabetic kidney complication: Secondary | ICD-10-CM | POA: Diagnosis present

## 2013-04-10 DIAGNOSIS — N183 Chronic kidney disease, stage 3 unspecified: Secondary | ICD-10-CM | POA: Diagnosis present

## 2013-04-10 DIAGNOSIS — Z7902 Long term (current) use of antithrombotics/antiplatelets: Secondary | ICD-10-CM

## 2013-04-10 DIAGNOSIS — Z9119 Patient's noncompliance with other medical treatment and regimen: Secondary | ICD-10-CM

## 2013-04-10 DIAGNOSIS — Z79899 Other long term (current) drug therapy: Secondary | ICD-10-CM

## 2013-04-10 DIAGNOSIS — G4733 Obstructive sleep apnea (adult) (pediatric): Secondary | ICD-10-CM | POA: Diagnosis present

## 2013-04-10 DIAGNOSIS — R059 Cough, unspecified: Secondary | ICD-10-CM

## 2013-04-10 DIAGNOSIS — E1165 Type 2 diabetes mellitus with hyperglycemia: Secondary | ICD-10-CM | POA: Diagnosis present

## 2013-04-10 DIAGNOSIS — Z91199 Patient's noncompliance with other medical treatment and regimen due to unspecified reason: Secondary | ICD-10-CM

## 2013-04-10 DIAGNOSIS — I129 Hypertensive chronic kidney disease with stage 1 through stage 4 chronic kidney disease, or unspecified chronic kidney disease: Secondary | ICD-10-CM | POA: Diagnosis present

## 2013-04-10 DIAGNOSIS — I509 Heart failure, unspecified: Secondary | ICD-10-CM | POA: Diagnosis present

## 2013-04-10 DIAGNOSIS — Z794 Long term (current) use of insulin: Secondary | ICD-10-CM

## 2013-04-10 DIAGNOSIS — N058 Unspecified nephritic syndrome with other morphologic changes: Secondary | ICD-10-CM | POA: Diagnosis present

## 2013-04-10 DIAGNOSIS — Z95810 Presence of automatic (implantable) cardiac defibrillator: Secondary | ICD-10-CM

## 2013-04-10 DIAGNOSIS — F329 Major depressive disorder, single episode, unspecified: Secondary | ICD-10-CM | POA: Diagnosis present

## 2013-04-10 DIAGNOSIS — N179 Acute kidney failure, unspecified: Secondary | ICD-10-CM | POA: Diagnosis present

## 2013-04-10 DIAGNOSIS — M109 Gout, unspecified: Secondary | ICD-10-CM | POA: Diagnosis present

## 2013-04-10 DIAGNOSIS — G7000 Myasthenia gravis without (acute) exacerbation: Secondary | ICD-10-CM | POA: Diagnosis present

## 2013-04-10 DIAGNOSIS — F3289 Other specified depressive episodes: Secondary | ICD-10-CM | POA: Diagnosis present

## 2013-04-10 DIAGNOSIS — R0602 Shortness of breath: Secondary | ICD-10-CM

## 2013-04-10 DIAGNOSIS — R509 Fever, unspecified: Secondary | ICD-10-CM

## 2013-04-10 DIAGNOSIS — R05 Cough: Secondary | ICD-10-CM

## 2013-04-10 DIAGNOSIS — R911 Solitary pulmonary nodule: Secondary | ICD-10-CM | POA: Diagnosis present

## 2013-04-10 DIAGNOSIS — K219 Gastro-esophageal reflux disease without esophagitis: Secondary | ICD-10-CM | POA: Diagnosis present

## 2013-04-10 DIAGNOSIS — I10 Essential (primary) hypertension: Secondary | ICD-10-CM | POA: Diagnosis present

## 2013-04-10 DIAGNOSIS — F411 Generalized anxiety disorder: Secondary | ICD-10-CM | POA: Diagnosis present

## 2013-04-10 DIAGNOSIS — J189 Pneumonia, unspecified organism: Secondary | ICD-10-CM

## 2013-04-10 DIAGNOSIS — R079 Chest pain, unspecified: Secondary | ICD-10-CM

## 2013-04-10 DIAGNOSIS — G8929 Other chronic pain: Secondary | ICD-10-CM | POA: Diagnosis present

## 2013-04-10 DIAGNOSIS — I428 Other cardiomyopathies: Secondary | ICD-10-CM | POA: Diagnosis present

## 2013-04-10 DIAGNOSIS — Z87891 Personal history of nicotine dependence: Secondary | ICD-10-CM

## 2013-04-10 DIAGNOSIS — I5022 Chronic systolic (congestive) heart failure: Secondary | ICD-10-CM | POA: Diagnosis present

## 2013-04-10 LAB — CBC WITH DIFFERENTIAL/PLATELET
Basophils Absolute: 0 10*3/uL (ref 0.0–0.1)
Basophils Relative: 1 % (ref 0–1)
EOS ABS: 0 10*3/uL (ref 0.0–0.7)
Eosinophils Relative: 1 % (ref 0–5)
HCT: 44.4 % (ref 39.0–52.0)
Hemoglobin: 15.8 g/dL (ref 13.0–17.0)
LYMPHS PCT: 19 % (ref 12–46)
Lymphs Abs: 1 10*3/uL (ref 0.7–4.0)
MCH: 29.8 pg (ref 26.0–34.0)
MCHC: 35.6 g/dL (ref 30.0–36.0)
MCV: 83.8 fL (ref 78.0–100.0)
Monocytes Absolute: 0.4 10*3/uL (ref 0.1–1.0)
Monocytes Relative: 8 % (ref 3–12)
NEUTROS PCT: 72 % (ref 43–77)
Neutro Abs: 3.7 10*3/uL (ref 1.7–7.7)
Platelets: 163 10*3/uL (ref 150–400)
RBC: 5.3 MIL/uL (ref 4.22–5.81)
RDW: 15.5 % (ref 11.5–15.5)
WBC: 5.2 10*3/uL (ref 4.0–10.5)

## 2013-04-10 LAB — LIPASE, BLOOD: Lipase: 27 U/L (ref 11–59)

## 2013-04-10 LAB — I-STAT TROPONIN, ED: TROPONIN I, POC: 0.07 ng/mL (ref 0.00–0.08)

## 2013-04-10 LAB — CBG MONITORING, ED: GLUCOSE-CAPILLARY: 154 mg/dL — AB (ref 70–99)

## 2013-04-10 LAB — COMPREHENSIVE METABOLIC PANEL
ALK PHOS: 77 U/L (ref 39–117)
ALT: 7 U/L (ref 0–53)
AST: 16 U/L (ref 0–37)
Albumin: 3.1 g/dL — ABNORMAL LOW (ref 3.5–5.2)
BUN: 39 mg/dL — ABNORMAL HIGH (ref 6–23)
CO2: 24 mEq/L (ref 19–32)
Calcium: 9.1 mg/dL (ref 8.4–10.5)
Chloride: 92 mEq/L — ABNORMAL LOW (ref 96–112)
Creatinine, Ser: 2.23 mg/dL — ABNORMAL HIGH (ref 0.50–1.35)
GFR calc non Af Amer: 34 mL/min — ABNORMAL LOW (ref >90)
GFR, EST AFRICAN AMERICAN: 39 mL/min — AB (ref >90)
GLUCOSE: 149 mg/dL — AB (ref 70–99)
POTASSIUM: 3.3 meq/L — AB (ref 3.7–5.3)
SODIUM: 133 meq/L — AB (ref 137–147)
TOTAL PROTEIN: 7.6 g/dL (ref 6.0–8.3)
Total Bilirubin: 2.9 mg/dL — ABNORMAL HIGH (ref 0.3–1.2)

## 2013-04-10 LAB — I-STAT CG4 LACTIC ACID, ED: LACTIC ACID, VENOUS: 1.92 mmol/L (ref 0.5–2.2)

## 2013-04-10 LAB — PRO B NATRIURETIC PEPTIDE: Pro B Natriuretic peptide (BNP): 5986 pg/mL — ABNORMAL HIGH (ref 0–125)

## 2013-04-10 MED ORDER — ONDANSETRON HCL 4 MG/2ML IJ SOLN
4.0000 mg | Freq: Once | INTRAMUSCULAR | Status: AC
  Administered 2013-04-10: 4 mg via INTRAVENOUS
  Filled 2013-04-10: qty 2

## 2013-04-10 MED ORDER — SODIUM CHLORIDE 0.9 % IV BOLUS (SEPSIS): 250.0000 mL | Freq: Once | INTRAVENOUS | Status: DC

## 2013-04-10 MED ORDER — HYDROMORPHONE HCL PF 1 MG/ML IJ SOLN
1.0000 mg | Freq: Once | INTRAMUSCULAR | Status: AC
  Administered 2013-04-10: 1 mg via INTRAVENOUS
  Filled 2013-04-10: qty 1

## 2013-04-10 MED ORDER — SODIUM CHLORIDE 0.9 % IV SOLN
INTRAVENOUS | Status: DC
  Administered 2013-04-10: 19:00:00 via INTRAVENOUS

## 2013-04-10 MED ORDER — MORPHINE SULFATE 4 MG/ML IJ SOLN
4.0000 mg | Freq: Once | INTRAMUSCULAR | Status: AC
  Administered 2013-04-10: 4 mg via INTRAVENOUS
  Filled 2013-04-10: qty 1

## 2013-04-10 MED ORDER — OSELTAMIVIR PHOSPHATE 30 MG PO CAPS
30.0000 mg | ORAL_CAPSULE | Freq: Two times a day (BID) | ORAL | Status: AC
  Administered 2013-04-11 – 2013-04-15 (×10): 30 mg via ORAL
  Filled 2013-04-10 (×11): qty 1

## 2013-04-10 NOTE — ED Notes (Signed)
Called report to Tiffany RN, POD C.  

## 2013-04-10 NOTE — ED Notes (Signed)
Moved to room E48.

## 2013-04-10 NOTE — ED Notes (Signed)
Pt back from CT

## 2013-04-10 NOTE — Telephone Encounter (Signed)
[facility] RN called concerning patient, was called urgently to house because patient stated he was sick.  Temp 102.0 F, per patient he took 2 tylenol for fever.  Resp rate 24, rhonchi throughout, no rales noted.  HR 120, BP 120/80.  Patient has strong congested cough without expectorant.  Patient adamant he cannot leave his house until 5pm until childcare for his 3 children are arranged.  Advised for him to seek urgent care or ED at that time.  RN callback number [phone]

## 2013-04-10 NOTE — ED Notes (Signed)
Moving pt to room C24

## 2013-04-10 NOTE — ED Notes (Signed)
Patient transported to X-ray 

## 2013-04-10 NOTE — ED Notes (Signed)
Patient transported to CT 

## 2013-04-10 NOTE — ED Provider Notes (Signed)
    Nelia Shi, MD 04/10/13 [phone]

## 2013-04-10 NOTE — ED Notes (Signed)
Pt just informed nurse he has not urinated since Sunday, has been drinking very little PO fluids.  PA aware.

## 2013-04-10 NOTE — ED Provider Notes (Signed)
[name] S 8:00 PM patient discussed in sign out. Patient with significant medical problems including high blood pressure, diabetes, CHF who presents with worsening cough, shortness of breath and pleuritic chest pains the past 2-3 days. The patient does have home health nurse due to the multiple health problems. Initial lab testing and chest x-ray without significant findings for fever and cough. CT of chest pending.   9:20 PM CT scan shows questionable bronchopneumonia. Patient continues to have slight tachypnea. He he states that he is feeling very poorly does not feel he can return home and has no energy. We'll plan to consult internal medicine residents.  Spoke with internal medicine residents. They will see patient and evaluate for admission.  Angus Seller, PA-C 04/10/13 [id number]

## 2013-04-10 NOTE — ED Notes (Signed)
To ED in hall way waiting on room.  Onset 2-3 days NP cough, central chest pain- constant, increases with deep breaths.  Lungs clear.  ASA 324mg  given by EMS.  SR with occ PVC's.  Pts HHN came for visit this morning temperature was 102, H R 120, BP increased, nurse place call to PCP and was advised to go to ED.

## 2013-04-10 NOTE — ED Notes (Signed)
Internal medicine at bedside

## 2013-04-10 NOTE — ED Notes (Signed)
Pt's CBG is 154 mg/dl. RN notified.

## 2013-04-10 NOTE — ED Provider Notes (Signed)
CSN: [id number]     Arrival date & time 04/10/13  1811 History   First MD Initiated Contact with Patient 04/10/13 1819     Chief Complaint  Patient presents with  . Chest Pain  . Cough  . Fever     (Consider location/radiation/quality/duration/timing/severity/associated sxs/prior Treatment) Patient is a 48 y.o. male presenting with chest pain, cough, and fever. The history is provided by the patient.  Chest Pain Associated symptoms: cough, fever and shortness of breath   Associated symptoms: no abdominal pain, no back pain, no headache and no nausea   Cough Associated symptoms: chest pain, fever and shortness of breath   Associated symptoms: no headaches and no rash   Fever Associated symptoms: chest pain and cough   Associated symptoms: no confusion, no congestion, no dysuria, no headaches, no nausea and no rash    patient with known history of CHF frequent pneumonias. Patient has had 8-3 day history of a nonproductive cough early on but did have some yellow sputum. Central chest pain made worse by coughing and taking deep breaths. Consistent with a pleuritic type chest pain. Home nurse checks them frequently for his severe CHF and for his diabetes. Attempts morning 102. Patient's concerns for having pneumonia. Patient is followed by Augustina Mood from cardiology. And regular doctor is Dr. Virgina Organ. Patient has not been checking his blood sugars. Temperature upon arrival here was 100.8. Room air sats were 95%. No significant tachycardia. Patient has a history of an implantable defibrillator. Past Medical History  Diagnosis Date  . Hypertension   . CHF (congestive heart failure)     a. 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 b. EF 20-25%, s/p [employer] AICD placement 04/2007. c. Echo 06/2012: EF 10%.  . History of peptic ulcer disease     EGD 09/2007 shows prox gastric ulcer with black eschar, treated with PPI  . Depression   . History of alcohol abuse     quit approx  2011-2012  . OSA (obstructive sleep apnea)     Intolerant of CPAP  . ICD (implantable cardiac defibrillator) in place   . Diabetes mellitus     insulin dependent  . GERD (gastroesophageal reflux disease)   . Headache(784.0)   . Myasthenia gravis 10/12/2011    Diagnosed in 09/2011. Received Plasmapheresis. CT chest no Thymoma. Acetylcholine receptor antibody negative.    . CKD (chronic kidney disease) stage 3, GFR 30-59 ml/min   . Polysubstance abuse     Including cocaine (+ UDS multiple times in 2014)  . PUD (peptic ulcer disease)   . Pulmonary nodule     a. CT 09/2011.  Marland Kitchen CAD (coronary artery disease)     a. NSTEMI 06/2012: cath showed subtotal thrombotic occlusion of the distal OM1, too small for PCI, ?cocaine-triggered plaque rupture with thrombosis (no LV thrombus on f/u echo).    Past Surgical History  Procedure Laterality Date  . Cardiac defibrillator placement  04/2007  . Knee surgery    . Skin grafts    . Facial reconstruction surgery    . Insertion of dialysis catheter  10/12/2011    temporary    Family History  Problem Relation Age of Onset  . Heart failure Mother   . Hypertension Mother   . Sarcoidosis Mother   . Diabetes type II Mother   . Heart attack Brother    History  Substance Use Topics  . Smoking status: Former Smoker    Types: Cigars  Quit date: 07/27/2007  . Smokeless tobacco: Former Neurosurgeon     Comment: Quit x 1 month.  . Alcohol Use: No     Comment: no alcohol since 2012    Review of Systems  Constitutional: Positive for fever.  HENT: Negative for congestion.   Eyes: Negative for visual disturbance.  Respiratory: Positive for cough and shortness of breath.   Cardiovascular: Positive for chest pain.  Gastrointestinal: Negative for nausea and abdominal pain.  Genitourinary: Negative for dysuria.  Musculoskeletal: Negative for back pain.  Skin: Negative for rash.  Neurological: Negative for headaches.  Hematological: Does not bruise/bleed easily.   Psychiatric/Behavioral: Negative for confusion.      Allergies  Shellfish allergy; Adhesive; Tylenol; and Zoloft  Home Medications   Current Outpatient Rx  Name  Route  Sig  Dispense  Refill  . aspirin EC 81 MG tablet   Oral   Take 1 tablet (81 mg total) by mouth daily.   30 tablet   6   . carvedilol (COREG) 3.125 MG tablet   Oral   Take 1 tablet (3.125 mg total) by mouth 2 (two) times daily with a meal.   60 tablet   6   . clopidogrel (PLAVIX) 75 MG tablet   Oral   Take 1 tablet (75 mg total) by mouth daily with breakfast.   30 tablet   11   . clorazepate (TRANXENE) 7.5 MG tablet   Oral   Take 1 tablet (7.5 mg total) by mouth daily. Take three tablets by mouth three times a day (1 in the morning and 2 in the evening): For anxiety   30 tablet   3   . colchicine 0.6 MG tablet   Oral   Take 1 tablet (0.6 mg total) by mouth 2 (two) times daily.   20 tablet   0   . dextromethorphan-guaiFENesin (MUCINEX DM) 30-600 MG per 12 hr tablet   Oral   Take 1 tablet by mouth every 12 (twelve) hours.         Marland Kitchen dextromethorphan-guaiFENesin (TUSSIN DM) 10-100 MG/5ML liquid   Oral   Take 5 mLs by mouth every 4 (four) hours as needed for cough.   180 mL   0   . digoxin (LANOXIN) 0.125 MG tablet   Oral   Take 1 tablet (0.125 mg total) by mouth daily. For heart   30 tablet   5   . hydrALAZINE (APRESOLINE) 25 MG tablet   Oral   Take 1.5 tablets (37.5 mg total) by mouth every 8 (eight) hours.   30 tablet   0   . hydrocortisone cream 1 %   Topical   Apply topically 2 (two) times daily.   30 g   0   . hydrOXYzine (ATARAX/VISTARIL) 25 MG tablet   Oral   Take 50 mg by mouth at bedtime. Take 1 or 2 tablets by mouth 3 times daily as needed for itching.(Max of 6 tablets a day): For anxiety/itching         . insulin glargine (LANTUS) 100 UNIT/ML injection   Subcutaneous   Inject 0.15 mLs (15 Units total) into the skin at bedtime.   10 mL   5   . isosorbide  mononitrate (IMDUR) 60 MG 24 hr tablet   Oral   Take 1 tablet (60 mg total) by mouth daily. For chronic chest pains         . ketoconazole (NIZORAL) 2 % cream   Topical   Apply  1 application topically daily.   60 g   0   . metolazone (ZAROXOLYN) 2.5 MG tablet   Oral   Take 1 tablet (2.5 mg total) by mouth 3 (three) times a week. Mon-Wed-Sat   15 tablet   6   . omeprazole (PRILOSEC) 20 MG capsule   Oral   Take 20 mg by mouth daily.         Marland Kitchen. oxyCODONE (ROXICODONE) 5 MG immediate release tablet   Oral   Take 1 tablet (5 mg total) by mouth every 4 (four) hours as needed for pain.   15 tablet   0   . potassium chloride SA (K-DUR,KLOR-CON) 20 MEQ tablet   Oral   Take 2 tablets (40 mEq total) by mouth daily.   60 tablet   6   . ranitidine (ZANTAC) 150 MG tablet   Oral   Take 1 tablet (150 mg total) by mouth daily.   60 tablet   3   . rosuvastatin (CRESTOR) 10 MG tablet   Oral   Take 1 tablet (10 mg total) by mouth daily. For control of high cholesterol         . torsemide (DEMADEX) 20 MG tablet   Oral   Take 6 tablets (120 mg total) by mouth daily with breakfast. For control of high blood pressure/sewllings.   180 tablet   3   . traMADol (ULTRAM) 50 MG tablet   Oral   Take 1 tablet (50 mg total) by mouth every 6 (six) hours as needed for pain.   12 tablet   0    BP 108/78  Pulse 97  Temp(Src) 100.1 F (37.8 C) (Oral)  Resp 27  Ht 6' (1.829 m)  Wt 212 lb (96.163 kg)  BMI 28.75 kg/m2  SpO2 100% Physical Exam  Nursing note and vitals reviewed. Constitutional: He is oriented to person, place, and time. He appears well-developed and well-nourished. No distress.  HENT:  Head: Normocephalic and atraumatic.  Mouth/Throat: Oropharynx is clear and moist. No oropharyngeal exudate.  Eyes: Conjunctivae and EOM are normal. Pupils are equal, round, and reactive to light.  Neck: Normal range of motion. Neck supple.  Cardiovascular: Normal rate, regular rhythm  and normal heart sounds.   Pulmonary/Chest: Effort normal and breath sounds normal. No respiratory distress. He has no wheezes. He has no rales. He exhibits tenderness.  Some reproducible chest tenderness to palpation anteriorly.  Abdominal: Soft. Bowel sounds are normal. There is no tenderness.  Neurological: He is alert and oriented to person, place, and time. No cranial nerve deficit. He exhibits normal muscle tone. Coordination normal.  Skin: Skin is warm. No rash noted.    ED Course  Procedures (including critical care time) Labs Review Labs Reviewed  COMPREHENSIVE METABOLIC PANEL - Abnormal; Notable for the following:    Sodium 133 (*)    Potassium 3.3 (*)    Chloride 92 (*)    Glucose, Bld 149 (*)    BUN 39 (*)    Creatinine, Ser 2.23 (*)    Albumin 3.1 (*)    Total Bilirubin 2.9 (*)    GFR calc non Af Amer 34 (*)    GFR calc Af Amer 39 (*)    All other components within normal limits  PRO B NATRIURETIC PEPTIDE - Abnormal; Notable for the following:    Pro B Natriuretic peptide (BNP) 5986.0 (*)    All other components within normal limits  CBG MONITORING, ED - Abnormal; Notable for  the following:    Glucose-Capillary 154 (*)    All other components within normal limits  CULTURE, BLOOD (ROUTINE X 2)  CULTURE, BLOOD (ROUTINE X 2)  CBC WITH DIFFERENTIAL  LIPASE, BLOOD  I-STAT CG4 LACTIC ACID, ED  Rosezena Sensor, ED   Imaging Review Dg Chest 2 View  04/10/2013   CLINICAL DATA:  Chest pain, cough, fever  EXAM: CHEST  2 VIEW  COMPARISON:  Prior radiograph from 02/15/2013  FINDINGS: Left-sided transvenous pacemaker/ AICD is unchanged. Moderate cardiomegaly is stable.  Lungs are normally inflated. No focal infiltrate to suggest acute infectious pneumonitis is identified. No pulmonary edema or pleural effusion. Mild diffuse bronchitic changes are present. No pneumothorax.  No acute osseus abnormality.  IMPRESSION: 1. Mild diffuse bronchitic changes, which may reflect  bronchiolitis in the setting of cough and fever. No focal infiltrate to suggest bacterial pneumonia. 2. Stable cardiomegaly without pulmonary edema or pleural effusion.   Electronically Signed   By: Rise Mu M.D.   On: 04/10/2013 19:35     EKG Interpretation   Date/Time:  Tuesday April 10 2013 18:25:40 EDT Ventricular Rate:  97 PR Interval:  196 QRS Duration: 110 QT Interval:  378 QTC Calculation: 480 R Axis:   94 Text Interpretation:  Age not entered, assumed to be  48 years old for  purpose of ECG interpretation Multiform ventricular premature complexes  Probable left atrial enlargement Incomplete left bundle branch block Low  voltage, extremity leads Borderline prolonged QT interval No significant  change since last tracing Confirmed by Alpheus Stiff  MD, Zadaya Cuadra [phone]) on  04/10/2013 6:32:12 PM      MDM   Final diagnoses:  Fever  Cough  Chest pain  Shortness of breath    Clinically suspected patient would have a recurrent pneumonia with fevers pleuritic chest pain cough cough making chest hurt more. However initial chest x-ray is negative for pneumonia white count shows no significant leukocytosis. No significant for glycemic. Patient is on Doss and diuretics that probably explains the low sodium and potassium and chloride but are not clinically overwhelming. Patient once chest x-ray was negative was given a 250 cc bolus of normal saline. Patient will be given CT chest without contrast to further evaluate for possible occult pneumonia or congestive heart failure not showing up on the CT scan. Also for the persistent chest pain we'll get a troponin just to be sure that were not missing an acute cardiac event. EKG had no acute changes. Patient will reassessed after the troponin is back after the CT chest. Patient feels that he needs to be admitted. Symptoms all may be related to a viral bronchitis.   Shelda Jakes, MD 04/10/13 2029
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
## 2013-04-11 ENCOUNTER — Other Ambulatory Visit: Payer: Self-pay

## 2013-04-11 DIAGNOSIS — I5022 Chronic systolic (congestive) heart failure: Secondary | ICD-10-CM

## 2013-04-11 DIAGNOSIS — N183 Chronic kidney disease, stage 3 unspecified: Secondary | ICD-10-CM

## 2013-04-11 DIAGNOSIS — R079 Chest pain, unspecified: Secondary | ICD-10-CM

## 2013-04-11 DIAGNOSIS — E1129 Type 2 diabetes mellitus with other diabetic kidney complication: Secondary | ICD-10-CM

## 2013-04-11 DIAGNOSIS — I509 Heart failure, unspecified: Secondary | ICD-10-CM

## 2013-04-11 DIAGNOSIS — J189 Pneumonia, unspecified organism: Principal | ICD-10-CM

## 2013-04-11 LAB — BASIC METABOLIC PANEL
BUN: 41 mg/dL — ABNORMAL HIGH (ref 6–23)
CALCIUM: 8.6 mg/dL (ref 8.4–10.5)
CO2: 23 mEq/L (ref 19–32)
Chloride: 89 mEq/L — ABNORMAL LOW (ref 96–112)
Creatinine, Ser: 2.4 mg/dL — ABNORMAL HIGH (ref 0.50–1.35)
GFR calc Af Amer: 36 mL/min — ABNORMAL LOW (ref >90)
GFR, EST NON AFRICAN AMERICAN: 31 mL/min — AB (ref >90)
Glucose, Bld: 187 mg/dL — ABNORMAL HIGH (ref 70–99)
POTASSIUM: 3.4 meq/L — AB (ref 3.7–5.3)
SODIUM: 130 meq/L — AB (ref 137–147)

## 2013-04-11 LAB — CBC
HCT: 43.5 % (ref 39.0–52.0)
HEMOGLOBIN: 15.2 g/dL (ref 13.0–17.0)
MCH: 29.5 pg (ref 26.0–34.0)
MCHC: 34.9 g/dL (ref 30.0–36.0)
MCV: 84.5 fL (ref 78.0–100.0)
Platelets: 155 10*3/uL (ref 150–400)
RBC: 5.15 MIL/uL (ref 4.22–5.81)
RDW: 15.6 % — AB (ref 11.5–15.5)
WBC: 5.2 10*3/uL (ref 4.0–10.5)

## 2013-04-11 LAB — GLUCOSE, CAPILLARY
GLUCOSE-CAPILLARY: 163 mg/dL — AB (ref 70–99)
Glucose-Capillary: 166 mg/dL — ABNORMAL HIGH (ref 70–99)
Glucose-Capillary: 213 mg/dL — ABNORMAL HIGH (ref 70–99)
Glucose-Capillary: 211 mg/dL — ABNORMAL HIGH (ref 70–99)
Glucose-Capillary: 201 mg/dL — ABNORMAL HIGH (ref 70–99)

## 2013-04-11 LAB — TROPONIN I
Troponin I: <0.3 ng/mL (ref <0.30)
Troponin I: <0.3 ng/mL (ref <0.30)

## 2013-04-11 LAB — DIGOXIN LEVEL: Digoxin Level: <0.3 ng/mL — ABNORMAL LOW (ref 0.8–2.0)

## 2013-04-11 LAB — INFLUENZA PANEL BY PCR (TYPE A & B)
H1N1 flu by pcr: NOT DETECTED
INFLAPCR: NEGATIVE
Influenza B By PCR: NEGATIVE

## 2013-04-11 LAB — STREP PNEUMONIAE URINARY ANTIGEN: Strep Pneumo Urinary Antigen: NEGATIVE

## 2013-04-11 LAB — MAGNESIUM: MAGNESIUM: 1.7 mg/dL (ref 1.5–2.5)

## 2013-04-11 LAB — HIV ANTIBODY (ROUTINE TESTING W REFLEX): HIV: NONREACTIVE

## 2013-04-11 MED ORDER — LEVOFLOXACIN IN D5W 750 MG/150ML IV SOLN: 750.0000 mg | INTRAVENOUS | Status: DC

## 2013-04-11 MED ORDER — SODIUM CHLORIDE 0.9 % IJ SOLN: 3.0000 mL | INTRAMUSCULAR | Status: DC | PRN

## 2013-04-11 MED ORDER — SODIUM CHLORIDE 0.9 % IV SOLN: 250.0000 mL | INTRAVENOUS | Status: DC | PRN

## 2013-04-11 MED ORDER — HEPARIN SODIUM (PORCINE) 5000 UNIT/ML IJ SOLN
5000.0000 [IU] | Freq: Three times a day (TID) | INTRAMUSCULAR | Status: DC
  Administered 2013-04-11 – 2013-04-14 (×9): 5000 [IU] via SUBCUTANEOUS
  Filled 2013-04-11 (×19): qty 1

## 2013-04-11 MED ORDER — DIGOXIN 125 MCG PO TABS
0.1250 mg | ORAL_TABLET | Freq: Every day | ORAL | Status: DC
  Administered 2013-04-11 – 2013-04-16 (×6): 0.125 mg via ORAL
  Filled 2013-04-11 (×6): qty 1

## 2013-04-11 MED ORDER — SODIUM CHLORIDE 0.9 % IJ SOLN
3.0000 mL | Freq: Two times a day (BID) | INTRAMUSCULAR | Status: DC
  Administered 2013-04-11 – 2013-04-16 (×11): 3 mL via INTRAVENOUS

## 2013-04-11 MED ORDER — INSULIN GLARGINE 100 UNIT/ML ~~LOC~~ SOLN
10.0000 [IU] | Freq: Every day | SUBCUTANEOUS | Status: DC
  Administered 2013-04-11 (×2): 10 [IU] via SUBCUTANEOUS
  Filled 2013-04-11 (×3): qty 0.1

## 2013-04-11 MED ORDER — LEVOFLOXACIN IN D5W 750 MG/150ML IV SOLN
750.0000 mg | Freq: Every day | INTRAVENOUS | Status: DC
  Administered 2013-04-11: 750 mg via INTRAVENOUS
  Filled 2013-04-11: qty 150

## 2013-04-11 MED ORDER — CARVEDILOL 3.125 MG PO TABS
3.1250 mg | ORAL_TABLET | Freq: Two times a day (BID) | ORAL | Status: DC
  Administered 2013-04-11 – 2013-04-16 (×11): 3.125 mg via ORAL
  Filled 2013-04-11 (×13): qty 1

## 2013-04-11 MED ORDER — FAMOTIDINE 20 MG PO TABS
20.0000 mg | ORAL_TABLET | Freq: Every day | ORAL | Status: DC
Start: 2013-04-11 — End: 2013-04-16
  Administered 2013-04-11 – 2013-04-16 (×6): 20 mg via ORAL
  Filled 2013-04-11 (×6): qty 1

## 2013-04-11 MED ORDER — OXYCODONE HCL 5 MG PO TABS
5.0000 mg | ORAL_TABLET | Freq: Four times a day (QID) | ORAL | Status: DC | PRN
  Administered 2013-04-11 – 2013-04-13 (×5): 5 mg via ORAL
  Filled 2013-04-11 (×5): qty 1

## 2013-04-11 MED ORDER — LEVOFLOXACIN IN D5W 750 MG/150ML IV SOLN
750.0000 mg | INTRAVENOUS | Status: DC
  Filled 2013-04-11: qty 150

## 2013-04-11 MED ORDER — CLORAZEPATE DIPOTASSIUM 3.75 MG PO TABS: 7.5000 mg | ORAL_TABLET | Freq: Every day | ORAL | Status: DC

## 2013-04-11 MED ORDER — CLOPIDOGREL BISULFATE 75 MG PO TABS
75.0000 mg | ORAL_TABLET | Freq: Every day | ORAL | Status: DC
Start: 2013-04-11 — End: 2013-04-16
  Administered 2013-04-11 – 2013-04-16 (×6): 75 mg via ORAL
  Filled 2013-04-11 (×6): qty 1

## 2013-04-11 MED ORDER — OXYCODONE HCL 5 MG PO TABS
5.0000 mg | ORAL_TABLET | ORAL | Status: DC | PRN
  Administered 2013-04-11 (×2): 5 mg via ORAL
  Filled 2013-04-11 (×2): qty 1

## 2013-04-11 MED ORDER — CLORAZEPATE DIPOTASSIUM 3.75 MG PO TABS
15.0000 mg | ORAL_TABLET | Freq: Every day | ORAL | Status: DC
  Administered 2013-04-11: 15 mg via ORAL
  Filled 2013-04-11: qty 4

## 2013-04-11 MED ORDER — LEVOFLOXACIN 750 MG PO TABS
750.0000 mg | ORAL_TABLET | ORAL | Status: DC
  Administered 2013-04-13 – 2013-04-15 (×2): 750 mg via ORAL
  Filled 2013-04-11 (×3): qty 1

## 2013-04-11 MED ORDER — POTASSIUM CHLORIDE CRYS ER 20 MEQ PO TBCR
20.0000 meq | EXTENDED_RELEASE_TABLET | Freq: Once | ORAL | Status: AC
  Administered 2013-04-11: 20 meq via ORAL
  Filled 2013-04-11: qty 1

## 2013-04-11 MED ORDER — ATORVASTATIN CALCIUM 80 MG PO TABS
80.0000 mg | ORAL_TABLET | Freq: Every day | ORAL | Status: DC
  Administered 2013-04-11 – 2013-04-16 (×6): 80 mg via ORAL
  Filled 2013-04-11 (×6): qty 1

## 2013-04-11 MED ORDER — SODIUM CHLORIDE 0.9 % IJ SOLN: 3.0000 mL | Freq: Two times a day (BID) | INTRAMUSCULAR | Status: DC

## 2013-04-11 NOTE — H&P (Signed)
Internal Medicine Attending Admission Note Date: 04/11/2013  Patient name: [name] Medical record number: [id number] Date of birth: Mar 15, 1965 Age: 48 y.o. Gender: male  I saw and evaluated the patient. I reviewed the resident's note and I agree with the resident's findings and plan as documented in the resident's note.  [name] is a 48 year old man with a history of alcoholic cardiomyopathy and a left ventricular ejection fraction of 10% with AICD, coronary artery disease status post a non-ST elevation MI in 2014, hypertension, obstructive sleep apnea, diabetes, and stage III chronic kidney disease who presents with a five-day history of progressive dyspnea on exertion, productive cough, and subjective fevers. The symptoms progressed over the previous 5 days and he presented to the emergency department for further evaluation. Evaluation there included a CT scan of the chest which was suggestive of an early right lower lobe pneumonia. He was therefore admitted to the internal medicine teaching service for further evaluation and care.  Of note, because of his recent malaise he has had decreased oral intake over the last several days. He states his urine output has markedly decreased since then. Also, his daughter recently had a cold, and this is the only known sick contact. For his right lower lobe community-acquired pneumonia he was placed on Levaquin intravenously and Tamiflu as influenza could not be completely excluded. Blood cultures were obtained and are pending at the time of this dictation. With regards to his systolic cardiomyopathy he appears dry on exam. This is likely related to his decreased oral intake recently. Nonetheless, we will continue his outpatient cardiomyopathy regimen. Because of the mild chest pain he has experienced he will be ruled out for myocardial infarction with serial cardiac enzymes and ECGs. The rest of his chronic medical problems, including diabetes, will be  managed as per his outpatient regimen. I anticipate [name] will improve with this therapy for his right lower lobe community-acquired pneumonia and should be ready for discharge in the next 2-3 days.

## 2013-04-11 NOTE — Progress Notes (Signed)
Utilization review completed.  

## 2013-04-11 NOTE — Progress Notes (Signed)
ANTIBIOTIC CONSULT NOTE - INITIAL  Pharmacy Consult for Levoquin Indication: pneumonia  Allergies  Allergen Reactions  . Shellfish Allergy Anaphylaxis  . Tramadol     Causes seizures  . Adhesive [Tape] Hives  . Tylenol [Acetaminophen] Nausea Only  . Zoloft [Sertraline Hcl] Other (See Comments)    Talked to people in his sleep May 2013    Patient Measurements: Height: 6' (182.9 cm) Weight: 211 lb 8 oz (95.936 kg) IBW/kg (Calculated) : 77.6  Vital Signs: Temp: 98.2 F (36.8 C) (03/25 0546) Temp src: Oral (03/25 0546) BP: 97/78 mmHg (03/25 0546) Pulse Rate: 100 (03/25 0546) Intake/Output from previous day: 03/24 0701 - 03/25 0700 In: -  Out: 200 [Urine:200] Intake/Output from this shift: Total I/O In: -  Out: 200 [Urine:200]  Labs:  Recent Labs  04/10/13 1850 04/11/13 0315  WBC 5.2 5.2  HGB 15.8 15.2  PLT 163 155  CREATININE 2.23* 2.40*   Estimated Creatinine Clearance: 46.7 ml/min (by C-G formula based on Cr of 2.4). No results found for this basename: VANCOTROUGH, VANCOPEAK, VANCORANDOM, GENTTROUGH, GENTPEAK, GENTRANDOM, TOBRATROUGH, TOBRAPEAK, TOBRARND, AMIKACINPEAK, AMIKACINTROU, AMIKACIN,  in the last 72 hours   Microbiology: No results found for this or any previous visit (from the past 720 hour(s)).  Medical History: Past Medical History  Diagnosis Date  . Hypertension   . CHF (congestive heart failure)     a. 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 b. EF 20-25%, s/p St. Jude AICD placement 04/2007. c. Echo 06/2012: EF 10%.  . History of peptic ulcer disease     EGD 09/2007 shows prox gastric ulcer with black eschar, treated with PPI  . Depression   . History of alcohol abuse     quit approx 2011-2012  . OSA (obstructive sleep apnea)     Intolerant of CPAP  . ICD (implantable cardiac defibrillator) in place   . Diabetes mellitus     insulin dependent  . GERD (gastroesophageal reflux disease)   . Headache(784.0)   .  Myasthenia gravis 10/12/2011    Diagnosed in 09/2011. Received Plasmapheresis. CT chest no Thymoma. Acetylcholine receptor antibody negative.    . CKD (chronic kidney disease) stage 3, GFR 30-59 ml/min   . Polysubstance abuse     Including cocaine (+ UDS multiple times in 2014)  . PUD (peptic ulcer disease)   . Pulmonary nodule     a. CT 09/2011.  Marland Kitchen. CAD (coronary artery disease)     a. NSTEMI 06/2012: cath showed subtotal thrombotic occlusion of the distal OM1, too small for PCI, ?cocaine-triggered plaque rupture with thrombosis (no LV thrombus on f/u echo).     Medications:  Scheduled:  . atorvastatin  80 mg Oral Daily  . carvedilol  3.125 mg Oral BID WC  . clopidogrel  75 mg Oral Q breakfast  . clorazepate  15 mg Oral QHS  . clorazepate  7.5 mg Oral Daily  . digoxin  0.125 mg Oral Daily  . famotidine  20 mg Oral Daily  . heparin  5,000 Units Subcutaneous 3 times per day  . insulin glargine  10 Units Subcutaneous QHS  . levofloxacin (LEVAQUIN) IV  750 mg Intravenous QHS  . oseltamivir  30 mg Oral BID  . sodium chloride  3 mL Intravenous Q12H  . sodium chloride  3 mL Intravenous Q12H   Assessment: 48 yo F presents 3/24 w/ SOB, productive cough and fever. Recent admission in 01/2013 for CAP treated w/ levo. CXR shows mild digest  bacterial pna. CT revealing bronchitits w/ RLL bronchopneumonia. Mr. Christoph is currently afebrile, wbc wnl. The patients renal function is slightly above there baseline Cr (Baseline 2-2.2) and will need renal adjustment for antibiotics.    Goal of Therapy:  Eradicate Infection  Plan:  - Adjust to Levo 750mg  IV q48h - Monitor renal function, clinical progress, and cultures  Dolphus Jenny 04/11/2013,6:45 AM

## 2013-04-11 NOTE — Progress Notes (Signed)
Subjective:  Patient si very tired this morning after being up late in the ED. He states that he continues to feel weak and very thirsty. He is tolerating PO food and water w/o problems. However, food just does not taste good to him.   Objective: Vital signs in last 24 hours: Filed Vitals:   04/10/13 2230 04/11/13 0041 04/11/13 0132 04/11/13 0546  BP: 109/75 106/73 112/74 97/78  Pulse: 46 99 106 100  Temp:  100 F (37.8 C) 99 F (37.2 C) 98.2 F (36.8 C)  TempSrc:  Oral Oral Oral  Resp: 19     Height:   6' (1.829 m)   Weight:   211 lb 8 oz (95.936 kg)   SpO2: 98% 99% 100% 100%   Weight change:   Intake/Output Summary (Last 24 hours) at 04/11/13 0930 Last data filed at 04/11/13 0719  Gross per 24 hour  Intake    300 ml  Output    200 ml  Net    100 ml   Physical Exam  Constitutional: He appears well-developed and well-nourished. No distress.  Patient very tired during conversation.  HENT:  Head: Normocephalic and atraumatic.  Cardiovascular: Normal rate, regular rhythm, normal heart sounds and intact distal pulses.  Exam reveals no friction rub.   No murmur heard. Pulmonary/Chest: Effort normal. No respiratory distress. He has no wheezes. He has rales.  Moderate rales in Right lower lobe  Neurological: He is alert.  Very drowsy, difficult staying awake  Skin: He is not diaphoretic.  Psychiatric: He has a normal mood and affect. His behavior is normal.  Tired, difficult staying awake    Lab Results: Basic Metabolic Panel:  Recent Labs Lab 04/10/13 1850 04/11/13 0315  NA 133* 130*  K 3.3* 3.4*  CL 92* 89*  CO2 24 23  GLUCOSE 149* 187*  BUN 39* 41*  CREATININE 2.23* 2.40*  CALCIUM 9.1 8.6  MG  --  1.7   Liver Function Tests:  Recent Labs Lab 04/10/13 1850  AST 16  ALT 7  ALKPHOS 77  BILITOT 2.9*  PROT 7.6  ALBUMIN 3.1*    Recent Labs Lab 04/10/13 1850  LIPASE 27   CBC:  Recent Labs Lab 04/10/13 1850 04/11/13 0315  WBC 5.2 5.2    NEUTROABS 3.7  --   HGB 15.8 15.2  HCT 44.4 43.5  MCV 83.8 84.5  PLT 163 155   Cardiac Enzymes:  Recent Labs Lab 04/11/13 0315 04/11/13 0725  TROPONINI <0.30 <0.30   BNP:  Recent Labs Lab 04/10/13 1850  PROBNP 5986.0*   CBG:  Recent Labs Lab 04/10/13 1902 04/11/13 0138 04/11/13 0744  GLUCAP 154* 163* 166*   Urine Drug Screen: Drugs of Abuse     Component Value Date/Time   LABOPIA NONE DETECTED 08/04/2012 1742   LABOPIA NEG 02/01/2012 1356   COCAINSCRNUR POSITIVE* 08/04/2012 1742   COCAINSCRNUR PPS 02/01/2012 1356   LABBENZ NONE DETECTED 08/04/2012 1742   LABBENZ PPS 02/01/2012 1356   LABBENZ NEG 07/09/2010 1522   AMPHETMU NONE DETECTED 08/04/2012 1742   AMPHETMU NEG 07/09/2010 1522   THCU NONE DETECTED 08/04/2012 1742   LABBARB NONE DETECTED 08/04/2012 1742   LABBARB NEG 02/01/2012 1356    Studies/Results: Dg Chest 2 View  04/10/2013   CLINICAL DATA:  Chest pain, cough, fever  EXAM: CHEST  2 VIEW  COMPARISON:  Prior radiograph from 02/15/2013  FINDINGS: Left-sided transvenous pacemaker/ AICD is unchanged. Moderate cardiomegaly is stable.  Lungs are normally  inflated. No focal infiltrate to suggest acute infectious pneumonitis is identified. No pulmonary edema or pleural effusion. Mild diffuse bronchitic changes are present. No pneumothorax.  No acute osseus abnormality.  IMPRESSION: 1. Mild diffuse bronchitic changes, which may reflect bronchiolitis in the setting of cough and fever. No focal infiltrate to suggest bacterial pneumonia. 2. Stable cardiomegaly without pulmonary edema or pleural effusion.   Electronically Signed   By: Rise Mu M.D.   On: 04/10/2013 19:35   Ct Chest Wo Contrast  04/10/2013   CLINICAL DATA:  Fever, cough, chest pain.  EXAM: CT CHEST WITHOUT CONTRAST  TECHNIQUE: Multidetector CT imaging of the chest was performed following the standard protocol without IV contrast.  COMPARISON:  Chest x-ray 01/30/2013.  FINDINGS: Mediastinum: Heart size  is mildly enlarged. There is no significant pericardial fluid, thickening or pericardial calcification. Left-sided pacemaker/AICD with lead tip terminating in the right ventricular apex. Numerous borderline and mildly enlarged mediastinal and hilar lymph nodes, similar to prior examinations, measuring up to 2.0 cm in the subcarinal station, 1.3 cm in the low left paratracheal station and 1.2 cm in the lower right paratracheal station. These are similar to prior studies. Esophagus is unremarkable in appearance.  Lungs/Pleura: Mild diffuse bronchial wall thickening. Peribronchovascular ground-glass attenuation micronodularity most notably in the right lower lobe, concerning for acute bronchopneumonia. Similar findings are present to a lesser extent in the right upper lobe. The previously described right lower lobe nodule has further decreased in size, currently measuring only 3 mm (image 30 of series 3), compatible with a benign etiology. No pleural effusions.  Upper Abdomen: Calcified granulomas in the right lobe of the liver.  Musculoskeletal: There are no aggressive appearing lytic or blastic lesions noted in the visualized portions of the skeleton.  IMPRESSION: 1. Bronchitis with right lower lobe bronchopneumonia. 2. Cardiomegaly is similar to the prior study. 3. Mediastinal adenopathy similar to the prior examination. Clinical correlation for signs and symptoms of lymphoproliferative disorder such as lymphoma, or systemic disease such as sarcoidosis is recommended.   Electronically Signed   By: Trudie Reed M.D.   On: 04/10/2013 21:08   Medications: I have reviewed the patient's current medications. Scheduled Meds: . atorvastatin  80 mg Oral Daily  . carvedilol  3.125 mg Oral BID WC  . clopidogrel  75 mg Oral Q breakfast  . clorazepate  15 mg Oral QHS  . clorazepate  7.5 mg Oral Daily  . digoxin  0.125 mg Oral Daily  . famotidine  20 mg Oral Daily  . heparin  5,000 Units Subcutaneous 3 times per  day  . insulin glargine  10 Units Subcutaneous QHS  . [START ON 04/13/2013] levofloxacin  750 mg Oral Q48H  . oseltamivir  30 mg Oral BID  . sodium chloride  3 mL Intravenous Q12H  . 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 As patient is now tolerating PO, plan to transition to PO ABX. Similarly, this presentation is also consistent  with influenza. As influenza PCR has poor sensitivity, plan to continue tamiflu PO. 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 No evidence of volume overload. This appears stable at this time.  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 As he complains of chest pain, ACS was ruled out with serial troponins. Further, EKG demonstrated no dynamic ST/T wave changes. His chest pain is likely due to CAP.  - Continue home coreg, plavix, and statin  CKD Patient has CKD that's has  been slowly progressive since 2008. His Cr is mildly increased on admission (2.4 this am). The etiology of the patients CKD is likely uncontrolled DM, HTN. Possible AKI likely due to poor PO intake in setting of continued diuretic use. - Trend Cr - Hold diuretics  Depression/Anxiety Patient reports medication non-compliance with home meds (BZD chlorazepate 7.5 mg po in AM and 15 mg in PM). The patient received 15 mg chlorazepate at 230 am on 3/25. At 8:30 am on 3/25 he was unable to keep his eyes open. I suspect the AMS was 2/2 BZD. I plan to d/c BZD and monitor patient for signs of w/d.  DM II Poorly controlled as outpatient. Home lantus qhs 15 U. Stable on admission. - SSI - 2/3 home dose(10 U) lantus this evening  HTN Stable. - Continue coreg, holding diuretics per above.  GERD Stable, continue home zantac.  Gout Stable, continue home colchicine.  OSA CPAP  Myasthenia Gravis Appears stable. No reporteded home meds. Will observe w/o intervention at this time.   Dispo: Disposition is deferred at this time, awaiting improvement of current medical problems.  Anticipated discharge in approximately 1-2 day(s).   The patient does have a current PCP Darden Palmer(Samaya Qureshi, MD) and does need an [facility] follow-up appointment after discharge.  The patient does not know have transportation limitations that hinder transportation to clinic appointments.  .Services Needed at time of discharge: Y = Yes, Blank = No PT:   OT:   RN:   Equipment:   Other:     LOS: 1 day   Pleas Kochhristopher Khyla Mccumbers, MD 04/11/2013, 9:30 AM

## 2013-04-12 DIAGNOSIS — R509 Fever, unspecified: Secondary | ICD-10-CM

## 2013-04-12 LAB — URINALYSIS, ROUTINE W REFLEX MICROSCOPIC
Glucose, UA: NEGATIVE mg/dL
Hgb urine dipstick: NEGATIVE
Ketones, ur: NEGATIVE mg/dL
LEUKOCYTES UA: NEGATIVE
NITRITE: NEGATIVE
PROTEIN: 100 mg/dL — AB
Specific Gravity, Urine: 1.02 (ref 1.005–1.030)
UROBILINOGEN UA: 1 mg/dL (ref 0.0–1.0)
pH: 5 (ref 5.0–8.0)

## 2013-04-12 LAB — LEGIONELLA ANTIGEN, URINE: Legionella Antigen, Urine: NEGATIVE

## 2013-04-12 LAB — BASIC METABOLIC PANEL
BUN: 54 mg/dL — AB (ref 6–23)
CALCIUM: 9 mg/dL (ref 8.4–10.5)
CO2: 26 mEq/L (ref 19–32)
Chloride: 90 mEq/L — ABNORMAL LOW (ref 96–112)
Creatinine, Ser: 2.8 mg/dL — ABNORMAL HIGH (ref 0.50–1.35)
GFR calc Af Amer: 30 mL/min — ABNORMAL LOW (ref >90)
GFR, EST NON AFRICAN AMERICAN: 26 mL/min — AB (ref >90)
GLUCOSE: 163 mg/dL — AB (ref 70–99)
Potassium: 3.6 mEq/L — ABNORMAL LOW (ref 3.7–5.3)
SODIUM: 132 meq/L — AB (ref 137–147)

## 2013-04-12 LAB — GLUCOSE, CAPILLARY
GLUCOSE-CAPILLARY: 204 mg/dL — AB (ref 70–99)
Glucose-Capillary: 234 mg/dL — ABNORMAL HIGH (ref 70–99)
Glucose-Capillary: 211 mg/dL — ABNORMAL HIGH (ref 70–99)
Glucose-Capillary: 184 mg/dL — ABNORMAL HIGH (ref 70–99)

## 2013-04-12 LAB — URINE MICROSCOPIC-ADD ON

## 2013-04-12 LAB — MAGNESIUM: MAGNESIUM: 2.3 mg/dL (ref 1.5–2.5)

## 2013-04-12 MED ORDER — SENNOSIDES-DOCUSATE SODIUM 8.6-50 MG PO TABS
1.0000 | ORAL_TABLET | Freq: Two times a day (BID) | ORAL | Status: DC
  Administered 2013-04-12 – 2013-04-16 (×9): 1 via ORAL
  Filled 2013-04-12 (×10): qty 1

## 2013-04-12 MED ORDER — INSULIN GLARGINE 100 UNIT/ML ~~LOC~~ SOLN
12.0000 [IU] | Freq: Every day | SUBCUTANEOUS | Status: DC
  Administered 2013-04-12 – 2013-04-15 (×4): 12 [IU] via SUBCUTANEOUS
  Filled 2013-04-12 (×5): qty 0.12

## 2013-04-12 MED ORDER — INSULIN ASPART 100 UNIT/ML ~~LOC~~ SOLN
0.0000 [IU] | Freq: Three times a day (TID) | SUBCUTANEOUS | Status: DC
  Administered 2013-04-13: 3 [IU] via SUBCUTANEOUS
  Administered 2013-04-13: 5 [IU] via SUBCUTANEOUS
  Administered 2013-04-13: 3 [IU] via SUBCUTANEOUS
  Administered 2013-04-14 (×2): 2 [IU] via SUBCUTANEOUS
  Administered 2013-04-15: 3 [IU] via SUBCUTANEOUS
  Administered 2013-04-15: 5 [IU] via SUBCUTANEOUS
  Administered 2013-04-16: 3 [IU] via SUBCUTANEOUS
  Administered 2013-04-16: 1 [IU] via SUBCUTANEOUS

## 2013-04-12 MED ORDER — INSULIN GLARGINE 100 UNIT/ML ~~LOC~~ SOLN
12.0000 [IU] | Freq: Every day | SUBCUTANEOUS | Status: DC
  Filled 2013-04-12: qty 0.12

## 2013-04-12 MED ORDER — INSULIN ASPART 100 UNIT/ML ~~LOC~~ SOLN
0.0000 [IU] | Freq: Every day | SUBCUTANEOUS | Status: DC
  Administered 2013-04-14: 22:00:00 via SUBCUTANEOUS

## 2013-04-12 NOTE — Progress Notes (Signed)
Subjective:  NAEON. Cr continues to trend up. Patient is now having good urine output.  Objective: Vital signs in last 24 hours: Filed Vitals:   04/12/13 0500 04/12/13 0620 04/12/13 0856 04/12/13 0858  BP:  95/73 92/58 90/65   Pulse:  74 79 78  Temp:  97.4 F (36.3 C)    TempSrc:  Oral    Resp:  18    Height:      Weight: 215 lb 6.4 oz (97.705 kg)     SpO2:  100%     Weight change: 3 lb 6.4 oz (1.542 kg)  Intake/Output Summary (Last 24 hours) at 04/12/13 1025 Last data filed at 04/11/13 2050  Gross per 24 hour  Intake    820 ml  Output      0 ml  Net    820 ml   Physical Exam  Constitutional: He appears well-developed and well-nourished. No distress.  HENT:  Head: Normocephalic and atraumatic.  Cardiovascular: Normal rate, regular rhythm, normal heart sounds and intact distal pulses.  Exam reveals no friction rub.   No murmur heard. Pulmonary/Chest: Effort normal. No respiratory distress. He has no wheezes. He has rales.  Moderate rales in Right lower lobe  Neurological: He is alert.  Skin: He is not diaphoretic.  Psychiatric: He has a normal mood and affect. His behavior is normal.    Lab Results: Basic Metabolic Panel:  Recent Labs Lab 04/11/13 0315 04/12/13 0516  NA 130* 132*  K 3.4* 3.6*  CL 89* 90*  CO2 23 26  GLUCOSE 187* 163*  BUN 41* 54*  CREATININE 2.40* 2.80*  CALCIUM 8.6 9.0  MG 1.7 2.3   Liver Function Tests:  Recent Labs Lab 04/10/13 1850  AST 16  ALT 7  ALKPHOS 77  BILITOT 2.9*  PROT 7.6  ALBUMIN 3.1*    Recent Labs Lab 04/10/13 1850  LIPASE 27   CBC:  Recent Labs Lab 04/10/13 1850 04/11/13 0315  WBC 5.2 5.2  NEUTROABS 3.7  --   HGB 15.8 15.2  HCT 44.4 43.5  MCV 83.8 84.5  PLT 163 155   Cardiac Enzymes:  Recent Labs Lab 04/11/13 0315 04/11/13 0725 04/11/13 1335  TROPONINI <0.30 <0.30 <0.30   BNP:  Recent Labs Lab 04/10/13 1850  PROBNP 5986.0*   CBG:  Recent Labs Lab 04/11/13 0138  04/11/13 0744 04/11/13 1128 04/11/13 1643 04/11/13 2110 04/12/13 0816  GLUCAP 163* 166* 213* 211* 201* 204*   Urine Drug Screen: Drugs of Abuse     Component Value Date/Time   LABOPIA NONE DETECTED 08/04/2012 1742   LABOPIA NEG 02/01/2012 1356   COCAINSCRNUR POSITIVE* 08/04/2012 1742   COCAINSCRNUR PPS 02/01/2012 1356   LABBENZ NONE DETECTED 08/04/2012 1742   LABBENZ PPS 02/01/2012 1356   LABBENZ NEG 07/09/2010 1522   AMPHETMU NONE DETECTED 08/04/2012 1742   AMPHETMU NEG 07/09/2010 1522   THCU NONE DETECTED 08/04/2012 1742   LABBARB NONE DETECTED 08/04/2012 1742   LABBARB NEG 02/01/2012 1356    Studies/Results: Dg Chest 2 View  04/10/2013   CLINICAL DATA:  Chest pain, cough, fever  EXAM: CHEST  2 VIEW  COMPARISON:  Prior radiograph from 02/15/2013  FINDINGS: Left-sided transvenous pacemaker/ AICD is unchanged. Moderate cardiomegaly is stable.  Lungs are normally inflated. No focal infiltrate to suggest acute infectious pneumonitis is identified. No pulmonary edema or pleural effusion. Mild diffuse bronchitic changes are present. No pneumothorax.  No acute osseus abnormality.  IMPRESSION: 1. Mild diffuse bronchitic changes, which may  reflect bronchiolitis in the setting of cough and fever. No focal infiltrate to suggest bacterial pneumonia. 2. Stable cardiomegaly without pulmonary edema or pleural effusion.   Electronically Signed   By: Rise MuBenjamin  McClintock M.D.   On: 04/10/2013 19:35   Ct Chest Wo Contrast  04/10/2013   CLINICAL DATA:  Fever, cough, chest pain.  EXAM: CT CHEST WITHOUT CONTRAST  TECHNIQUE: Multidetector CT imaging of the chest was performed following the standard protocol without IV contrast.  COMPARISON:  Chest x-ray 01/30/2013.  FINDINGS: Mediastinum: Heart size is mildly enlarged. There is no significant pericardial fluid, thickening or pericardial calcification. Left-sided pacemaker/AICD with lead tip terminating in the right ventricular apex. Numerous borderline and mildly  enlarged mediastinal and hilar lymph nodes, similar to prior examinations, measuring up to 2.0 cm in the subcarinal station, 1.3 cm in the low left paratracheal station and 1.2 cm in the lower right paratracheal station. These are similar to prior studies. Esophagus is unremarkable in appearance.  Lungs/Pleura: Mild diffuse bronchial wall thickening. Peribronchovascular ground-glass attenuation micronodularity most notably in the right lower lobe, concerning for acute bronchopneumonia. Similar findings are present to a lesser extent in the right upper lobe. The previously described right lower lobe nodule has further decreased in size, currently measuring only 3 mm (image 30 of series 3), compatible with a benign etiology. No pleural effusions.  Upper Abdomen: Calcified granulomas in the right lobe of the liver.  Musculoskeletal: There are no aggressive appearing lytic or blastic lesions noted in the visualized portions of the skeleton.  IMPRESSION: 1. Bronchitis with right lower lobe bronchopneumonia. 2. Cardiomegaly is similar to the prior study. 3. Mediastinal adenopathy similar to the prior examination. Clinical correlation for signs and symptoms of lymphoproliferative disorder such as lymphoma, or systemic disease such as sarcoidosis is recommended.   Electronically Signed   By: Trudie Reedaniel  Entrikin M.D.   On: 04/10/2013 21:08   Medications: I have reviewed the patient's current medications. Scheduled Meds: . atorvastatin  80 mg Oral Daily  . carvedilol  3.125 mg Oral BID WC  . clopidogrel  75 mg Oral Q breakfast  . digoxin  0.125 mg Oral Daily  . famotidine  20 mg Oral Daily  . heparin  5,000 Units Subcutaneous 3 times per day  . insulin glargine  10 Units Subcutaneous QHS  . [START ON 04/13/2013] levofloxacin  750 mg Oral Q48H  . oseltamivir  30 mg Oral BID  . 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 Doing well on PO ABX. Similarly, this presentation is also consistent with influenza. As influenza PCR has poor sensitivity, plan to continue tamiflu PO. - PO levaquin q 48 hr - PO tamiflu - HIV negative - s pneumo (neg) and legionella pending collection - Continue to hold diuretics, anticipate restarting on 3/27 - d/c oxycodone 5 mg q 6 hr prn due to AMS, tylenol ok.  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 No evidence of volume overload. This appears stable at this time.  - Continue home dig as level was low - Continue home coreg - Continue to hold diuretics(Zaroxolyn 2.5 mg po MWSa, and Torsemide 120 mg po qd), anticipate restarting on 3/26 -Daily weights + I/O's  Acute on chronic kidney disease Patient has CKD that's has been slowly progressive since 2008. His Cr increased on admission (2.2, 2.4, 2.8 on 3/26). The etiology of the patients CKD is likely uncontrolled DM, HTN. AKI likely due to poor PO intake in setting of continued diuretic use. - Trend Cr - Hold diuretics - F/U UA and urine eosinophils.  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 As he complains of chest pain, ACS was ruled out with serial troponins. Further, EKG demonstrated no dynamic ST/T wave changes. His chest pain is likely due to CAP.  - Continue home coreg, plavix, and statin   Depression/Anxiety Patient reports medication non-compliance with home meds (BZD  chlorazepate 7.5 mg po in AM and 15 mg in PM). The patient received 15 mg chlorazepate at 230 am on 3/25. At 8:30 am on 3/25 he was unable to keep his eyes open. I suspect the AMS was 2/2 BZD. I plan to d/c BZD and monitor patient for signs of w/d.  DM II Poorly controlled as outpatient. Home lantus qhs 15 U. Stable on admission. - SSI - 2/3 home dose(10 U) lantus this evening  HTN Stable. - Continue coreg, holding diuretics per above.  GERD Stable, continue home zantac.  Gout Stable, continue home colchicine.  OSA CPAP  Myasthenia Gravis Appears stable. No reporteded home meds. Will observe w/o intervention at this time.   Dispo: Disposition is deferred at this time, awaiting improvement of current medical problems.  Anticipated discharge in approximately 1-2 day(s).   The patient does have a current PCP Darden Palmer, MD) and does need an [facility] hospital follow-up appointment after discharge.  The patient does not know have transportation limitations that hinder transportation to clinic appointments.  .Services Needed at time of discharge: Y = Yes, Blank = No PT:   OT:   RN:   Equipment:   Other:     LOS: 2 days   Pleas Koch, MD 04/12/2013, 10:25 AM

## 2013-04-12 NOTE — Progress Notes (Signed)
    Day 2 of stay      Patient name: [name]  Medical record number: [id number]  Date of birth: [birth year]  Mr Palladino is being treated for LL pneumonia/flu and AKI in the setting of heavy diuretic use. 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 Today the patient was drowsy s/p administration of pain medication, when I stopped by his room with my team. He is off of his diuretics as he appeared dry and his creatinine has been climbing. However the patient has started to make good urine, and I agree with observing creatinine trend for now. Agree with PO levoquin, however, we need to monitor QTc as it was already >500. If it appears to be widening further, we need to change the antibiotic.  I saw and evaluated this patient in the morning rounds with IM team. I have read Dr. Louann Liv note and agree with his plan. Please read his note from today for details of management for today.   Aletta Edouard 04/12/2013, 12:56 PM.

## 2013-04-12 NOTE — Progress Notes (Signed)
Inpatient Diabetes Program Recommendations  AACE/ADA: New Consensus Statement on Inpatient Glycemic Control (2013)  Target Ranges:  Prepandial:   less than 140 mg/dL      Peak postprandial:   less than 180 mg/dL (1-2 hours)      Critically ill patients:  140 - 180 mg/dL   Results for [name], OKUMURA (MRN [id number]) as of 04/12/2013 13:16  Ref. Range 04/11/2013 07:44 04/11/2013 11:28 04/11/2013 16:43 04/11/2013 21:10 04/12/2013 08:16 04/12/2013 11:01  Glucose-Capillary Latest Range: 70-99 mg/dL 945 (H) 038 (H) 882 (H) 201 (H) 204 (H) 234 (H)   Diabetes history: DM2 Outpatient Diabetes medications: Lantus 15 units QHS Current orders for Inpatient glycemic control: Lantus 10 units QHS  Inpatient Diabetes Program Recommendations Insulin - Basal: Please consider increasing Lantus to 12 units QHS Correction (SSI): Please order Novolog sensitive correction scale ACHS.  Thanks, Orlando Penner, RN, MSN, CCRN Diabetes Coordinator Inpatient Diabetes Program [phone] (Team Pager) [phone] (AP office) [phone] [facility] office)

## 2013-04-12 NOTE — Progress Notes (Signed)
16 Beats V tach on monitor.  Pt sitting up in bed very upset about getting a pork chop instead of ham for dinner.  Pt denies CP, SOB.  BP stable.  MD made aware.  Will obtain EKG.  No further orders.  Will continue to monitor.

## 2013-04-12 NOTE — Progress Notes (Signed)
Pt should continue Tamiflu, per MD order.  Should also continue Droplet precautions while on Tamiflu, per Infectious Disease.

## 2013-04-12 NOTE — Discharge Instructions (Signed)

## 2013-04-12 NOTE — Progress Notes (Signed)
Pharmacist Heart Failure Core Measure Documentation  Assessment: [name] has an EF documented as 10% on 07/18/2012 by 2D echo.  Rationale: Heart failure patients with left ventricular systolic dysfunction (LVSD) and an EF < 40% should be prescribed an angiotensin converting enzyme inhibitor (ACEI) or angiotensin receptor blocker (ARB) at discharge unless a contraindication is documented in the medical record.  This patient is not currently on an ACEI or ARB for HF.  This note is being placed in the record in order to provide documentation that a contraindication to the use of these agents is present for this encounter.  ACE Inhibitor or Angiotensin Receptor Blocker is contraindicated (specify all that apply)  []   ACEI allergy AND ARB allergy []   Angioedema []   Moderate or severe aortic stenosis []   Hyperkalemia [x]   Hypotension []   Renal artery stenosis []   Worsening renal function, preexisting renal disease or dysfunction   [employer], [address].D., BCPS Clinical Pharmacist Pager: [phone] 04/12/2013 8:42 AM

## 2013-04-12 NOTE — Clinical Documentation Improvement (Signed)
Possible Clinical Conditions?  Acute Systolic Congestive Heart Failure Chronic Systolic Congestive Heart Failure Acute on Chronic Systolic Congestive Heart Failure Other Condition Cannot Clinically Determine   Risk Factors: Severe Systolic CHF with proBNP of 5986 on admission, noted per 3/24 progress notes. Systolic CHF, EF 31%, noted per 3/25 progress notes.   Thank You, Marciano Sequin, Clinical Documentation Specialist:  [phone]  [facility]- Health Information Management

## 2013-04-13 LAB — COMPREHENSIVE METABOLIC PANEL
ALT: 6 U/L (ref 0–53)
AST: 17 U/L (ref 0–37)
Albumin: 2.8 g/dL — ABNORMAL LOW (ref 3.5–5.2)
Alkaline Phosphatase: 65 U/L (ref 39–117)
BILIRUBIN TOTAL: 1.6 mg/dL — AB (ref 0.3–1.2)
BUN: 55 mg/dL — ABNORMAL HIGH (ref 6–23)
CALCIUM: 8.8 mg/dL (ref 8.4–10.5)
CO2: 29 meq/L (ref 19–32)
CREATININE: 2.59 mg/dL — AB (ref 0.50–1.35)
Chloride: 90 mEq/L — ABNORMAL LOW (ref 96–112)
GFR calc Af Amer: 33 mL/min — ABNORMAL LOW (ref >90)
GFR, EST NON AFRICAN AMERICAN: 28 mL/min — AB (ref >90)
GLUCOSE: 195 mg/dL — AB (ref 70–99)
Potassium: 3.8 mEq/L (ref 3.7–5.3)
Sodium: 130 mEq/L — ABNORMAL LOW (ref 137–147)
Total Protein: 6.9 g/dL (ref 6.0–8.3)

## 2013-04-13 LAB — GLUCOSE, CAPILLARY
GLUCOSE-CAPILLARY: 211 mg/dL — AB (ref 70–99)
Glucose-Capillary: 234 mg/dL — ABNORMAL HIGH (ref 70–99)
Glucose-Capillary: 157 mg/dL — ABNORMAL HIGH (ref 70–99)
Glucose-Capillary: 178 mg/dL — ABNORMAL HIGH (ref 70–99)

## 2013-04-13 MED ORDER — ONDANSETRON HCL 4 MG/2ML IJ SOLN: 4.0000 mg | Freq: Four times a day (QID) | INTRAMUSCULAR | Status: DC | PRN

## 2013-04-13 MED ORDER — POLYETHYLENE GLYCOL 3350 17 G PO PACK
17.0000 g | PACK | Freq: Every day | ORAL | Status: DC | PRN
  Administered 2013-04-13: 17 g via ORAL
  Filled 2013-04-13: qty 1

## 2013-04-13 MED ORDER — PROMETHAZINE HCL 12.5 MG PO TABS
12.5000 mg | ORAL_TABLET | Freq: Four times a day (QID) | ORAL | Status: DC | PRN
  Administered 2013-04-13: 12.5 mg via ORAL
  Filled 2013-04-13: qty 1

## 2013-04-13 MED ORDER — PROMETHAZINE HCL 25 MG/ML IJ SOLN: 25.0000 mg | Freq: Once | INTRAMUSCULAR | Status: DC

## 2013-04-13 NOTE — Progress Notes (Signed)
INTERNAL MEDICINE ATTENDING NOTE   Day 3 of stay      Patient name: [name]  Medical record number: [id number]  Date of birth: [birth year]  Patient clinically much improved. Transitioned to PO antibiotics. Creatinine trending down. Elevated bilirubin is resolving. Agree with monitoring prolonged Qtc.   I have examined and evaluated this patient today and discussed the care plan with my IM team. I agree with Dr Louann Liv note from today. Please see his note for detailed care management.  Hazelgrace Bonham 04/13/2013, 11:04 AM.

## 2013-04-13 NOTE — Progress Notes (Signed)
Subjective:  NAEON. Cr began to decrease this AM. Patient is overall feeling much better. However, he complains of constipation and nausea after eating breakfast.   Objective: Vital signs in last 24 hours: Filed Vitals:   04/12/13 1642 04/12/13 1936 04/12/13 2204 04/13/13 0502  BP: 94/72 106/77 100/76 94/69  Pulse: 71 75 52 67  Temp:   97.7 F (36.5 C) 97.5 F (36.4 C)  TempSrc:   Oral Oral  Resp:   18 18  Height:    6' (1.829 m)  Weight:    214 lb 1.1 oz (97.1 kg)  SpO2:   100% 100%   Weight change: -1 lb 5.3 oz (-0.605 kg)  Intake/Output Summary (Last 24 hours) at 04/13/13 1610 Last data filed at 04/12/13 2205  Gross per 24 hour  Intake      0 ml  Output    500 ml  Net   -500 ml   Physical Exam  Constitutional: He appears well-developed and well-nourished. No distress.  HENT:  Head: Normocephalic and atraumatic.  Cardiovascular: Normal rate, regular rhythm, normal heart sounds and intact distal pulses.  Exam reveals no friction rub.   No murmur heard. Pulmonary/Chest: Effort normal. No respiratory distress. He has no wheezes. He has no rales.  Musculoskeletal: He exhibits no edema and no tenderness.  Neurological: He is alert.  Skin: He is not diaphoretic.  Psychiatric: He has a normal mood and affect. His behavior is normal.    Lab Results: Basic Metabolic Panel:  Recent Labs Lab 04/11/13 0315 04/12/13 0516 04/13/13 0448  NA 130* 132* 130*  K 3.4* 3.6* 3.8  CL 89* 90* 90*  CO2 23 26 29   GLUCOSE 187* 163* 195*  BUN 41* 54* 55*  CREATININE 2.40* 2.80* 2.59*  CALCIUM 8.6 9.0 8.8  MG 1.7 2.3  --    Liver Function Tests:  Recent Labs Lab 04/10/13 1850 04/13/13 0448  AST 16 17  ALT 7 6  ALKPHOS 77 65  BILITOT 2.9* 1.6*  PROT 7.6 6.9  ALBUMIN 3.1* 2.8*    Recent Labs Lab 04/10/13 1850  LIPASE 27   CBC:  Recent Labs Lab 04/10/13 1850 04/11/13 0315  WBC 5.2 5.2  NEUTROABS 3.7  --   HGB 15.8 15.2  HCT 44.4 43.5  MCV 83.8 84.5  PLT  163 155   Cardiac Enzymes:  Recent Labs Lab 04/11/13 0315 04/11/13 0725 04/11/13 1335  TROPONINI <0.30 <0.30 <0.30   BNP:  Recent Labs Lab 04/10/13 1850  PROBNP 5986.0*   CBG:  Recent Labs Lab 04/11/13 1643 04/11/13 2110 04/12/13 0816 04/12/13 1101 04/12/13 1620 04/12/13 2211  GLUCAP 211* 201* 204* 234* 211* 184*   Urine Drug Screen: Drugs of Abuse     Component Value Date/Time   LABOPIA NONE DETECTED 08/04/2012 1742   LABOPIA NEG 02/01/2012 1356   COCAINSCRNUR POSITIVE* 08/04/2012 1742   COCAINSCRNUR PPS 02/01/2012 1356   LABBENZ NONE DETECTED 08/04/2012 1742   LABBENZ PPS 02/01/2012 1356   LABBENZ NEG 07/09/2010 1522   AMPHETMU NONE DETECTED 08/04/2012 1742   AMPHETMU NEG 07/09/2010 1522   THCU NONE DETECTED 08/04/2012 1742   LABBARB NONE DETECTED 08/04/2012 1742   LABBARB NEG 02/01/2012 1356    Studies/Results: No results found. Medications: I have reviewed the patient's current medications. Scheduled Meds: . atorvastatin  80 mg Oral Daily  . carvedilol  3.125 mg Oral BID WC  . clopidogrel  75 mg Oral Q breakfast  . digoxin  0.125 mg  Oral Daily  . famotidine  20 mg Oral Daily  . heparin  5,000 Units Subcutaneous 3 times per day  . insulin aspart  0-5 Units Subcutaneous QHS  . insulin aspart  0-9 Units Subcutaneous TID WC  . insulin glargine  12 Units Subcutaneous QHS  . levofloxacin  750 mg Oral Q48H  . oseltamivir  30 mg Oral BID  . senna-docusate  1 tablet Oral BID  . 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 Doing well on PO ABX. Similarly, this presentation is also consistent with influenza. As influenza PCR has poor sensitivity, plan to continue tamiflu PO. - PO levaquin q 48 hr --> repeating EKG on 3/27 will change if QT remains prolonged.  - PO tamiflu - HIV negative - s pneumo (neg) and legionella negative - Continue to hold diuretics, anticipate restarting on 3/28 - d/c oxycodone 5 mg q 6 hr prn due to AMS, tylenol ok.  Constipation Senna-dulcolax and miralax prn  Elevated Bilirubin Resolving.  Prolonged QT QTc is prolonged on 3/26 at 542.  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 No evidence of volume overload. This appears stable at this time.  - Continue home dig as level was low - Continue home coreg - Continue to hold diuretics(Zaroxolyn 2.5 mg po MWSa, and Torsemide 120 mg po qd), anticipate restarting on 3/26 -Daily weights + I/O's  Acute on chronic kidney disease Patient has CKD that's has been slowly progressive since 2008. His Cr increased on admission (2.2, 2.4, 2.8). Cr decreased to 2.59 on 3/17. The etiology of the patients CKD is likely uncontrolled DM, HTN. AKI likely due to poor PO intake in setting of continued diuretic use. AKI appears to be resolving at this eim. 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 As he complains of chest pain, ACS was ruled out with serial troponins. Further, EKG demonstrated no dynamic ST/T wave changes. His chest pain is likely due to CAP.  - Continue home coreg, plavix, and statin  Depression/Anxiety Patient reports medication non-compliance with home meds (BZD chlorazepate  7.5 mg po in AM and 15 mg in PM). The patient received 15 mg chlorazepate at 230 am on 3/25. At 8:30 am on 3/25 he was unable to keep his eyes open. I suspect the AMS was 2/2 BZD. I plan to d/c BZD and monitor patient for signs of w/d.  DM II Poorly controlled as outpatient. Home lantus qhs 15 U. Stable on admission. - SSI - 2/3 home dose(10 U) lantus this evening  HTN Stable. - Continue coreg, holding diuretics per above.  GERD Stable, continue home zantac.  Gout Stable, continue home colchicine.  OSA CPAP  Myasthenia  Gravis Appears stable. No reporteded home meds. Will observe w/o intervention at this time.   Dispo: Disposition is deferred at this time, awaiting improvement of current medical problems.  Anticipated discharge in approximately 1-2 day(s).   The patient does have a current PCP Darden Palmer(Samaya Qureshi, MD) and [facility] discharge.  The patient does not know have transportation limitations that hinder transportation to clinic appointments.  .Services Needed at time of discharge: Y = Yes, Blank = No PT:   OT:   RN:   Equipment:   Other:     LOS: 3 days   Pleas Kochhristopher Elliot Meldrum, MD 04/13/2013, 6:59 AM

## 2013-04-14 LAB — GLUCOSE, CAPILLARY
GLUCOSE-CAPILLARY: 164 mg/dL — AB (ref 70–99)
GLUCOSE-CAPILLARY: 159 mg/dL — AB (ref 70–99)
GLUCOSE-CAPILLARY: 120 mg/dL — AB (ref 70–99)
Glucose-Capillary: 210 mg/dL — ABNORMAL HIGH (ref 70–99)

## 2013-04-14 LAB — BASIC METABOLIC PANEL
BUN: 47 mg/dL — AB (ref 6–23)
CHLORIDE: 91 meq/L — AB (ref 96–112)
CO2: 29 meq/L (ref 19–32)
CREATININE: 2.38 mg/dL — AB (ref 0.50–1.35)
Calcium: 9.1 mg/dL (ref 8.4–10.5)
GFR calc Af Amer: 36 mL/min — ABNORMAL LOW (ref >90)
GFR calc non Af Amer: 31 mL/min — ABNORMAL LOW (ref >90)
Glucose, Bld: 192 mg/dL — ABNORMAL HIGH (ref 70–99)
POTASSIUM: 4.1 meq/L (ref 3.7–5.3)
Sodium: 134 mEq/L — ABNORMAL LOW (ref 137–147)

## 2013-04-14 MED ORDER — POLYETHYLENE GLYCOL 3350 17 G PO PACK
17.0000 g | PACK | Freq: Three times a day (TID) | ORAL | Status: DC
  Administered 2013-04-14 – 2013-04-15 (×3): 17 g via ORAL
  Filled 2013-04-14 (×8): qty 1

## 2013-04-14 MED ORDER — OXYCODONE HCL 5 MG PO TABS
2.5000 mg | ORAL_TABLET | Freq: Four times a day (QID) | ORAL | Status: DC | PRN
  Administered 2013-04-14 (×2): 2.5 mg via ORAL
  Filled 2013-04-14 (×3): qty 1

## 2013-04-14 NOTE — Progress Notes (Signed)
Subjective:  NAEON. Pt complaining of diffuse pain asking for pain medicine. Appears to be in slight opioid withdrawal and had been getting OxyIR 5mg  but having significant sedation so this was discontinued. Pt has hx of polysubstance abuse and therefore maybe taking some off the street as has only been 24-48 hrs without opiates. Pt ambulated in hall w/o distress, cp, sob.   Objective: Vital signs in last 24 hours: Filed Vitals:   04/13/13 0827 04/13/13 1840 04/13/13 2029 04/14/13 0624  BP: 103/72 114/84 85/62 113/73  Pulse: 68 85 78 75  Temp:   98.1 F (36.7 C) 97.5 F (36.4 C)  TempSrc:   Oral Oral  Resp:   18 18  Height:      Weight:      SpO2:   100% 100%   Weight change:   Intake/Output Summary (Last 24 hours) at 04/14/13 0750 Last data filed at 04/14/13 1610  Gross per 24 hour  Intake    240 ml  Output    850 ml  Net   -610 ml   Physical Exam  Constitutional: He appears well-developed and well-nourished. No distress.  HENT:  Head: Normocephalic and atraumatic.  Cardiovascular: Normal rate, regular rhythm, normal heart sounds and intact distal pulses.  Exam reveals no friction rub.   No murmur heard. Pulmonary/Chest: Effort normal. No respiratory distress. He has no wheezes. He has no rales.  Musculoskeletal: He exhibits no edema and no tenderness.  Neurological: He is alert.  Skin: He is not diaphoretic.  Psychiatric: He has a normal mood and affect. His behavior is normal.    Lab Results: Basic Metabolic Panel:  Recent Labs Lab 04/11/13 0315 04/12/13 0516 04/13/13 0448  NA 130* 132* 130*  K 3.4* 3.6* 3.8  CL 89* 90* 90*  CO2 23 26 29   GLUCOSE 187* 163* 195*  BUN 41* 54* 55*  CREATININE 2.40* 2.80* 2.59*  CALCIUM 8.6 9.0 8.8  MG 1.7 2.3  --    Liver Function Tests:  Recent Labs Lab 04/10/13 1850 04/13/13 0448  AST 16 17  ALT 7 6  ALKPHOS 77 65  BILITOT 2.9* 1.6*  PROT 7.6 6.9  ALBUMIN 3.1* 2.8*    Recent Labs Lab 04/10/13 1850   LIPASE 27   CBC:  Recent Labs Lab 04/10/13 1850 04/11/13 0315  WBC 5.2 5.2  NEUTROABS 3.7  --   HGB 15.8 15.2  HCT 44.4 43.5  MCV 83.8 84.5  PLT 163 155   Cardiac Enzymes:  Recent Labs Lab 04/11/13 0315 04/11/13 0725 04/11/13 1335  TROPONINI <0.30 <0.30 <0.30   BNP:  Recent Labs Lab 04/10/13 1850  PROBNP 5986.0*   CBG:  Recent Labs Lab 04/12/13 1620 04/12/13 2211 04/13/13 0726 04/13/13 1129 04/13/13 1717 04/13/13 2025  GLUCAP 211* 184* 211* 234* 157* 178*   Urine Drug Screen: Drugs of Abuse     Component Value Date/Time   LABOPIA NONE DETECTED 08/04/2012 1742   LABOPIA NEG 02/01/2012 1356   COCAINSCRNUR POSITIVE* 08/04/2012 1742   COCAINSCRNUR PPS 02/01/2012 1356   LABBENZ NONE DETECTED 08/04/2012 1742   LABBENZ PPS 02/01/2012 1356   LABBENZ NEG 07/09/2010 1522   AMPHETMU NONE DETECTED 08/04/2012 1742   AMPHETMU NEG 07/09/2010 1522   THCU NONE DETECTED 08/04/2012 1742   LABBARB NONE DETECTED 08/04/2012 1742   LABBARB NEG 02/01/2012 1356    Studies/Results: No results found. Medications: I have reviewed the patient's current medications. Scheduled Meds: . atorvastatin  80 mg Oral Daily  .  carvedilol  3.125 mg Oral BID WC  . clopidogrel  75 mg Oral Q breakfast  . digoxin  0.125 mg Oral Daily  . famotidine  20 mg Oral Daily  . heparin  5,000 Units Subcutaneous 3 times per day  . insulin aspart  0-5 Units Subcutaneous QHS  . insulin aspart  0-9 Units Subcutaneous TID WC  . insulin glargine  12 Units Subcutaneous QHS  . levofloxacin  750 mg Oral Q48H  . oseltamivir  30 mg Oral BID  . promethazine  25 mg Intravenous Once  . senna-docusate  1 tablet Oral BID  . 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 Doing well on PO ABX. Similarly, this presentation is also consistent with influenza. As influenza PCR has poor sensitivity, plan to continue tamiflu PO. - PO levaquin q 48 hr repeat EKG on 3/27 didn't show prolonged QT - PO tamiflu - HIV negative - s pneumo (neg) and legionella negative - Continue to hold diuretics, anticipate restarting on 3/29 pt still appears dry - only allow oxycodone 2.5 mg q 6 hr prn due to AMS, tylenol ok but pt only requesting dilaudid (pain seeking behavior).  Constipation Senna-dulcolax BID and miralax TID. Pt refusing enemas   Elevated Bilirubin Resolving.  Prolonged QT QTc is prolonged on 3/26 at 542 and 462 on 3/27.  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 No evidence of volume overload. This appears stable at this time.  - Continue home dig as level was low - Continue home coreg - Continue to hold diuretics(Zaroxolyn 2.5 mg po MWSa, and Torsemide 120 mg po qd), anticipate restarting on 3/29 -Daily weights + I/O's  Acute on chronic kidney disease Patient has CKD that's has been slowly progressive since 2008. His Cr increased on admission (2.2, 2.4, 2.8). Cr decreased to 2.59 on 3/17. The etiology of the patients CKD is likely uncontrolled DM, HTN. AKI likely due to poor PO intake in setting of continued diuretic use. AKI appears to be resolving at this eim. - Trend Cr - Hold diuretics  CAD Patient has hx of NSTEMI though to be due  to cocaine vasospasm (2014). As he complains of chest pain, ACS was ruled out with serial troponins. Further, EKG demonstrated no dynamic ST/T wave changes. His chest pain is likely due to CAP.  - Continue home coreg, plavix, and statin  Depression/Anxiety Patient reports medication non-compliance with home meds (BZD chlorazepate 7.5 mg po in AM and 15 mg in PM). The patient received 15 mg chlorazepate at 230 am on 3/25. At 8:30 am on 3/25 he was unable to keep his eyes open. I suspect the AMS was 2/2 BZD. I plan to d/c BZD and monitor patient for signs of w/d.  DM  II Poorly controlled as outpatient. Home lantus qhs 15 U. Stable on admission. - SSI - 2/3 home dose(10 U) lantus this evening  HTN Stable. - Continue coreg, holding diuretics per above.  GERD Stable, continue home zantac.  Gout Stable, continue home colchicine.  OSA CPAP  Myasthenia Gravis Appears stable. No reporteded home meds. Will observe w/o intervention at this time.   Dispo: Disposition is deferred at this time, awaiting improvement of current medical problems.  Anticipated discharge in approximately 1-2 day(s).   The patient does have a current PCP Darden Palmer(Samaya Qureshi, MD) and [facility] after discharge.  The patient does not know have transportation limitations that hinder transportation to clinic appointments.  .Services Needed at time of discharge: Y = Yes, Blank = No PT:   OT:   RN:   Equipment:   Other:     LOS: 4 days   Christen BameNora Chanette Demo, MD 04/14/2013, 7:50 AM

## 2013-04-15 LAB — GLUCOSE, CAPILLARY
GLUCOSE-CAPILLARY: 261 mg/dL — AB (ref 70–99)
Glucose-Capillary: 217 mg/dL — ABNORMAL HIGH (ref 70–99)
Glucose-Capillary: 145 mg/dL — ABNORMAL HIGH (ref 70–99)

## 2013-04-15 LAB — BASIC METABOLIC PANEL
BUN: 36 mg/dL — ABNORMAL HIGH (ref 6–23)
CALCIUM: 8.9 mg/dL (ref 8.4–10.5)
CHLORIDE: 92 meq/L — AB (ref 96–112)
CO2: 29 mEq/L (ref 19–32)
Creatinine, Ser: 2.11 mg/dL — ABNORMAL HIGH (ref 0.50–1.35)
GFR calc Af Amer: 42 mL/min — ABNORMAL LOW (ref >90)
GFR calc non Af Amer: 36 mL/min — ABNORMAL LOW (ref >90)
Glucose, Bld: 169 mg/dL — ABNORMAL HIGH (ref 70–99)
Potassium: 4.3 mEq/L (ref 3.7–5.3)
SODIUM: 133 meq/L — AB (ref 137–147)

## 2013-04-15 NOTE — Progress Notes (Signed)
Subjective:  [name]. Pt did well overnight with oxy ir 2.5mg  with resolution of "diffuse body pain." pt eating full diet w/o complaint. Having compliance issues with insulin and lab draws.   Objective: Vital signs in last 24 hours: Filed Vitals:   04/13/13 2029 04/14/13 0624 04/14/13 2100 04/15/13 0511  BP: 85/62 113/73 108/78 108/75  Pulse: 78 75 77 81  Temp: 98.1 F (36.7 C) 97.5 F (36.4 C) 98.2 F (36.8 C) 98.5 F (36.9 C)  TempSrc: Oral Oral Oral Oral  Resp: 18 18 16 16   Height:      Weight:      SpO2: 100% 100% 100% 99%   Weight change:   Intake/Output Summary (Last 24 hours) at 04/15/13 1478 Last data filed at 04/15/13 0600  Gross per 24 hour  Intake    720 ml  Output   1050 ml  Net   -330 ml   Physical Exam  Constitutional: He appears well-developed and well-nourished. No distress.  HENT:  Head: Normocephalic and atraumatic.  Cardiovascular: Normal rate, regular rhythm, normal heart sounds and intact distal pulses.  Exam reveals no friction rub.   No murmur heard. Pulmonary/Chest: Effort normal. No respiratory distress. He has no wheezes. He has no rales.  Musculoskeletal: He exhibits no edema and no tenderness.  Neurological: He is alert.  Skin: He is not diaphoretic.  Psychiatric: He has a normal mood and affect. His behavior is normal.    Lab Results: Basic Metabolic Panel:  Recent Labs Lab 04/11/13 0315 04/12/13 0516 04/13/13 0448 04/14/13 0915  NA 130* 132* 130* 134*  K 3.4* 3.6* 3.8 4.1  CL 89* 90* 90* 91*  CO2 23 26 29 29   GLUCOSE 187* 163* 195* 192*  BUN 41* 54* 55* 47*  CREATININE 2.40* 2.80* 2.59* 2.38*  CALCIUM 8.6 9.0 8.8 9.1  MG 1.7 2.3  --   --    Liver Function Tests:  Recent Labs Lab 04/10/13 1850 04/13/13 0448  AST 16 17  ALT 7 6  ALKPHOS 77 65  BILITOT 2.9* 1.6*  PROT 7.6 6.9  ALBUMIN 3.1* 2.8*    Recent Labs Lab 04/10/13 1850  LIPASE 27   CBC:  Recent Labs Lab 04/10/13 1850 04/11/13 0315  WBC 5.2 5.2   NEUTROABS 3.7  --   HGB 15.8 15.2  HCT 44.4 43.5  MCV 83.8 84.5  PLT 163 155   Cardiac Enzymes:  Recent Labs Lab 04/11/13 0315 04/11/13 0725 04/11/13 1335  TROPONINI <0.30 <0.30 <0.30   BNP:  Recent Labs Lab 04/10/13 1850  PROBNP 5986.0*   CBG:  Recent Labs Lab 04/13/13 1717 04/13/13 2025 04/14/13 0811 04/14/13 1256 04/14/13 1712 04/14/13 2133  GLUCAP 157* 178* 164* 159* 120* 210*   Urine Drug Screen: Drugs of Abuse     Component Value Date/Time   LABOPIA NONE DETECTED 08/04/2012 1742   LABOPIA NEG 02/01/2012 1356   COCAINSCRNUR POSITIVE* 08/04/2012 1742   COCAINSCRNUR PPS 02/01/2012 1356   LABBENZ NONE DETECTED 08/04/2012 1742   LABBENZ PPS 02/01/2012 1356   LABBENZ NEG 07/09/2010 1522   AMPHETMU NONE DETECTED 08/04/2012 1742   AMPHETMU NEG 07/09/2010 1522   THCU NONE DETECTED 08/04/2012 1742   LABBARB NONE DETECTED 08/04/2012 1742   LABBARB NEG 02/01/2012 1356    Studies/Results: No results found. Medications: I have reviewed the patient's current medications. Scheduled Meds: . atorvastatin  80 mg Oral Daily  . carvedilol  3.125 mg Oral BID WC  . clopidogrel  75  mg Oral Q breakfast  . digoxin  0.125 mg Oral Daily  . famotidine  20 mg Oral Daily  . heparin  5,000 Units Subcutaneous 3 times per day  . insulin aspart  0-5 Units Subcutaneous QHS  . insulin aspart  0-9 Units Subcutaneous TID WC  . insulin glargine  12 Units Subcutaneous QHS  . levofloxacin  750 mg Oral Q48H  . oseltamivir  30 mg Oral BID  . polyethylene glycol  17 g Oral TID  . promethazine  25 mg Intravenous Once  . senna-docusate  1 tablet Oral BID  . 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 Doing well on PO ABX. Similarly, this presentation is also consistent with influenza. As influenza PCR has poor sensitivity, plan to continue tamiflu PO. - PO levaquin q 48 hr repeat EKG on 3/27 didn't show prolonged QT - PO tamiflu - HIV negative - s pneumo (neg) and legionella negative - Continue to hold diuretics, anticipate restarting on 3/30pt still appears dry - only allow oxycodone 2.5 mg q 6 hr prn due to AMS, tylenol ok but pt only requesting dilaudid (pain seeking behavior).  Constipation Senna-dulcolax BID and miralax TID. 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 No evidence of volume overload and pt still appears . This appears stable at this time.  - Continue home dig as level was low - Continue home coreg - Continue to hold diuretics(Zaroxolyn 2.5 mg po MWSa, and Torsemide 120 mg po qd), anticipate restarting on 3/30 -Daily weights + I/O's  Acute on chronic kidney disease Patient has CKD that's has been slowly progressive since 2008. His Cr increased on admission (2.2, 2.4, 2.8). Cr decreased to 2.59>>2.38. The etiology of the patients CKD is likely uncontrolled DM, HTN. AKI likely due to poor PO intake in setting of continued diuretic use. AKI appears to be resolving at this eim. 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 As he complains of chest pain, ACS was ruled out with serial troponins. Further, EKG demonstrated no dynamic ST/T wave changes. His chest pain is likely due to CAP.  - Continue home coreg, plavix, and  statin  Depression/Anxiety Patient reports medication non-compliance with home meds (BZD chlorazepate 7.5 mg po in AM and 15 mg in PM). The patient received 15 mg chlorazepate at 230 am on 3/25. At 8:30 am on 3/25 he was unable to keep his eyes open. I suspect the AMS was 2/2 BZD. I plan to d/c BZD and monitor patient for signs of w/d.  DM II Poorly controlled as outpatient. Home lantus qhs 15 U. Stable on admission. - SSI - 2/3 home dose(10 U) lantus this evening  HTN Stable. - Continue coreg, holding diuretics per above.  GERD Stable, continue home zantac.  Gout Stable,  continue home colchicine.  OSA CPAP  Myasthenia Gravis Appears stable. No reporteded home meds. Will observe w/o intervention at this time.   Dispo: Disposition is deferred at this time, awaiting improvement of current medical problems.  Anticipated discharge in approximately 1-2 day(s).   The patient does have a current PCP Darden Palmer(Samaya Qureshi, MD) and [facility].  The patient does not know have transportation limitations that hinder transportation to clinic appointments.  .Services Needed at time of discharge: Y = Yes, Blank = No PT:   OT:   RN:   Equipment:   Other:     LOS: 5 days   Christen BameNora Lexy Meininger, MD 04/15/2013, 8:52 AM

## 2013-04-15 NOTE — Progress Notes (Signed)
Pt refused cbg and insulin this am

## 2013-04-16 LAB — GLUCOSE, CAPILLARY
GLUCOSE-CAPILLARY: 235 mg/dL — AB (ref 70–99)
Glucose-Capillary: 128 mg/dL — ABNORMAL HIGH (ref 70–99)

## 2013-04-16 MED ORDER — TORSEMIDE 20 MG PO TABS
120.0000 mg | ORAL_TABLET | Freq: Every day | ORAL | Status: DC
  Administered 2013-04-16: 120 mg via ORAL
  Filled 2013-04-16: qty 1

## 2013-04-16 MED ORDER — METOLAZONE 2.5 MG PO TABS
2.5000 mg | ORAL_TABLET | Freq: Once | ORAL | Status: AC
  Administered 2013-04-16: 2.5 mg via ORAL
  Filled 2013-04-16: qty 1

## 2013-04-16 MED ORDER — LEVOFLOXACIN 750 MG PO TABS: 750.0000 mg | ORAL_TABLET | Freq: Every day | ORAL | Status: DC

## 2013-04-16 MED ORDER — LEVOFLOXACIN 750 MG PO TABS
750.0000 mg | ORAL_TABLET | Freq: Every day | ORAL | Status: DC
  Administered 2013-04-16: 750 mg via ORAL
  Filled 2013-04-16: qty 1

## 2013-04-16 NOTE — Progress Notes (Signed)
Patient had 5 runs of Vtach at 12:53. Md. Glendell Docker, was made aware. Patient remained asymptomatic with stable VS. Md gave orders to d/c telemetry because he is being d/c'd home.

## 2013-04-16 NOTE — Progress Notes (Signed)
Inpatient Diabetes Program Recommendations  AACE/ADA: New Consensus Statement on Inpatient Glycemic Control (2013)  Target Ranges:  Prepandial:   less than 140 mg/dL      Peak postprandial:   less than 180 mg/dL (1-2 hours)      Critically ill patients:  140 - 180 mg/dL     Results for [name], [name] (MRN [id number]) as of 04/16/2013 09:21  Ref. Range 04/15/2013 11:47 04/15/2013 16:31 04/15/2013 20:23  Glucose-Capillary Latest Range: 70-99 mg/dL 712 (H) 197 (H) 588 (H)    Results for [name], [name] (MRN [id number]) as of 04/16/2013 09:21  Ref. Range 04/16/2013 08:56  Glucose-Capillary Latest Range: 70-99 mg/dL 158 (H)     **Current insulin regimen= Lantus 12 units QHS + Novolog Sensitive SSI tid ac + HS  **Patient on PO diet eating 80-100% of meals  **Having issues with postprandial CBGs   MD- Please consider adding Novolog meal coverage- Novolog 4 units tid with meals (hold if pt NPO, hold if pt eats <50%)    Will follow. Ambrose Finland RN, MSN, CDE Diabetes Coordinator Inpatient Diabetes Program Team Pager: [phone] (8a-10p)

## 2013-04-16 NOTE — Progress Notes (Signed)
NURSING PROGRESS NOTE  DIYOR MCLEISH [id number] Discharge Data: 04/16/2013 3:35 PM Attending Provider: Aletta Edouard, MD DVV:[id number], Lindalou Hose, MD     Halford Decamp to be D/C'd Home per MD order.  Discussed with the patient the After Visit Summary and all questions fully answered. All IV's discontinued with no bleeding noted. All belongings returned to patient for patient to take home.   Last Vital Signs:  Blood pressure 97/63, pulse 72, temperature 97.9 F (36.6 C), temperature source Oral, resp. rate 18, height 6' (1.829 m), weight 94.348 kg (208 lb), SpO2 99.00%.  Discharge Medication List   Medication List    STOP taking these medications       clorazepate 7.5 MG tablet  Commonly known as:  TRANXENE      TAKE these medications       atorvastatin 40 MG tablet  Commonly known as:  LIPITOR  Take 80 mg by mouth daily.     carvedilol 3.125 MG tablet  Commonly known as:  COREG  Take 3.125 mg by mouth 2 (two) times daily with a meal.     clopidogrel 75 MG tablet  Commonly known as:  PLAVIX  Take 75 mg by mouth daily with breakfast.     colchicine 0.6 MG tablet  Take 0.6 mg by mouth daily as needed (for gout).     digoxin 0.125 MG tablet  Commonly known as:  LANOXIN  Take 0.125 mg by mouth daily.     hydrOXYzine 25 MG tablet  Commonly known as:  ATARAX/VISTARIL  Take 50 mg by mouth at bedtime. Take 1 or 2 tablets by mouth 3 times daily as needed for itching.(Max of 6 tablets a day): For anxiety/itching     insulin glargine 100 UNIT/ML injection  Commonly known as:  LANTUS  Inject 15 Units into the skin at bedtime.     levofloxacin 750 MG tablet  Commonly known as:  LEVAQUIN  Take 1 tablet (750 mg total) by mouth daily.     metolazone 2.5 MG tablet  Commonly known as:  ZAROXOLYN  Take 2.5 mg by mouth 3 (three) times a week. Takes on Monday, Wednesday, and Saturday     potassium chloride SA 20 MEQ tablet  Commonly known as:  K-DUR,KLOR-CON  Take 40 mEq by  mouth daily.     ranitidine 150 MG tablet  Commonly known as:  ZANTAC  Take 150 mg by mouth daily.     torsemide 20 MG tablet  Commonly known as:  DEMADEX  Take 120 mg by mouth daily.

## 2013-04-16 NOTE — Discharge Summary (Signed)
Name: [name] MRN: [id number] DOB: [birth year] 48 y.o. 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     carvedilol 3.125 MG tablet  Commonly known as:  COREG  Take 3.125 mg by mouth 2 (two) times daily with a meal.     clopidogrel 75 MG tablet  Commonly known as:  PLAVIX  Take 75 mg by mouth daily with breakfast.     colchicine 0.6 MG tablet  Take 0.6 mg by mouth daily as needed (for gout).     digoxin 0.125 MG tablet  Commonly known as:  LANOXIN  Take 0.125 mg by mouth daily.     hydrOXYzine 25 MG tablet  Commonly known as:  ATARAX/VISTARIL  Take 50 mg by mouth at bedtime. Take 1 or 2 tablets by mouth 3 times daily as needed for itching.(Max of 6 tablets a day): For anxiety/itching     insulin glargine 100 UNIT/ML injection  Commonly known as:  LANTUS  Inject 15 Units into the skin at bedtime.     levofloxacin 750 MG tablet  Commonly known as:  LEVAQUIN  Take 1 tablet (750 mg total) by mouth daily.     metolazone 2.5 MG tablet  Commonly known as:  ZAROXOLYN  Take 2.5 mg by mouth 3 (three) times a week. Takes on Monday, Wednesday, and Saturday     potassium chloride SA 20 MEQ tablet  Commonly known as:  K-DUR,KLOR-CON    Take 40 mEq by mouth daily.     ranitidine 150 MG tablet  Commonly known as:  ZANTAC  Take 150 mg by mouth daily.     torsemide 20 MG tablet  Commonly known as:  DEMADEX  Take 120 mg by mouth daily.        Disposition and follow-up:   [name] was discharged from [facility] in Stable condition.  At the hospital follow up visit please address:  1.  Chronic Pain, PNA, DM, HF, AKI  2.  Labs / imaging needed at time of follow-up: BMP, CBG, repeat imaging to ensure clearing  3.  Pending labs/ test needing follow-up: None  Follow-up Appointments:     Follow-up Information   Follow up with Ky Barban, MD On 04/24/2013. (8:15 am)    Specialty:  Internal Medicine   Contact information:   [address] [place] [address] [phone]       Discharge Instructions: Discharge Orders   Future Appointments Provider Department Dept Phone   04/24/2013 8:15 AM Ky Barban, MD [facility] [phone]   Future Orders Complete By Expires   Call MD for:  As directed    Comments:     New or worsening symptoms.   Diet - low sodium heart healthy  As directed    Discharge instructions  As directed    Comments:     You have been diagnosed with pneumonia and acute kidney injury. Please take your medicines as prescribed. Please follow up with the appointments we made for you.   Increase activity slowly  As directed       Consultations:    Procedures Performed:  Dg Chest 2 View  04/10/2013   CLINICAL DATA:  Chest pain, cough, fever  EXAM: CHEST  2 VIEW  COMPARISON:  Prior radiograph from 02/15/2013  FINDINGS: Left-sided transvenous pacemaker/ AICD is unchanged. Moderate cardiomegaly is stable.  Lungs are normally inflated. No focal infiltrate to suggest acute infectious pneumonitis is identified. No pulmonary edema or pleural effusion. Mild diffuse bronchitic changes are present. No pneumothorax.  No acute osseus  abnormality.  IMPRESSION: 1. Mild diffuse bronchitic changes, which may reflect bronchiolitis in the setting of cough and fever. No focal infiltrate to suggest bacterial pneumonia. 2. Stable cardiomegaly without pulmonary edema or pleural effusion.   Electronically Signed   By: Rise MuBenjamin  McClintock M.D.   On: 04/10/2013 19:35   Ct Chest Wo Contrast  04/10/2013   CLINICAL DATA:  Fever, cough, chest pain.  EXAM: CT CHEST WITHOUT CONTRAST  TECHNIQUE: Multidetector CT imaging of the chest was performed following the standard protocol without IV contrast.  COMPARISON:  Chest x-ray 01/30/2013.  FINDINGS: Mediastinum: Heart size is mildly enlarged. There is no significant pericardial fluid, thickening or pericardial calcification. Left-sided pacemaker/AICD with lead tip terminating in the right ventricular apex. Numerous borderline and mildly enlarged mediastinal and hilar lymph nodes, similar to prior examinations, measuring up to 2.0 cm in the subcarinal station, 1.3 cm in the low left paratracheal station and 1.2 cm in the lower right paratracheal station. These are similar to prior studies. Esophagus is unremarkable in appearance.  Lungs/Pleura: Mild diffuse bronchial wall thickening. Peribronchovascular ground-glass attenuation micronodularity most notably in the right lower lobe, concerning for acute bronchopneumonia. Similar findings are present to a lesser extent in the right upper lobe. The previously described right lower lobe nodule has further decreased in size, currently measuring only 3 mm (image 30 of series 3), compatible with a benign etiology. No pleural effusions.  Upper Abdomen: Calcified granulomas in the right lobe of the liver.  Musculoskeletal: There are no aggressive appearing lytic or blastic lesions noted in the visualized portions of the skeleton.  IMPRESSION: 1. Bronchitis with right lower lobe bronchopneumonia. 2. Cardiomegaly is similar to the prior study. 3. Mediastinal adenopathy similar  to the prior examination. Clinical correlation for signs and symptoms of lymphoproliferative disorder such as lymphoma, or systemic disease such as sarcoidosis is recommended.   Electronically Signed   By: Trudie Reedaniel  Entrikin M.D.   On: 04/10/2013 21:08    Admission HPI: [name] is a 48 y.o. male w/ PMHx of HTN, CHF (EF 10%) w/ AICD, CAD (NSTEMI 2014), OSA, DM type II, Myasthenia Gravis (dianosed 2013), CKD stage III, GERD w/ PUD, depression, w/ h/o polysubstance abuse, presents to the ED w/ complaints of SOB, productive cough and fever. The patient claims his SOB started first, about 5-7 days ago, followed by a productive cough w/ yellow sputum, and most recently a subjective fever and chills starting 2 days ago. The patient also admits to some recent sharp central chest pain, worse w/ inspiration and  cough, as well as sore throat, nausea, and decreased po intake. He claims he has not felt like eating or drinking anything for the past 4 days. The patient has baseline SOB/DOE w/ severe CHF, but claims he has been having trouble breathing even at rest. He takes diuretics for his CHF (torsemide + metolazone) at home and says he still has not passed urine in ~2 days. He admits to a sick contact at home, claiming his daughter recently had a cold. He denies vomiting, abdominal pain, diarrhea, or dysuria.  Of note, the patient was recently treated for CAP in the [facility] clinic on 01/2913 w/ Levaquin. He also says he did not receive a flu shot this year.    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 Doing well on PO ABX. Similarly, this presentation is also consistent with influenza. On arrival to the ED, patient w/ temp of 100.8. CXR showed mild pneumonia. CT chest was also completed, revealing bronchitis w/ RLL bronchopneumonia. Given recent sick contact at home, possibility of influenza infection as well, especially as patient has not had flu shot this season. No leukocytosis in the ED, WBC's 5.2, w/ lactic acid of 1.92. Given h/o severe CHF, some question if this could also be contributing to symptoms, however, on exam, patient w/out crackles on auscultation, no LE edema, or weight change since last [facility] clinic visit. pBNP 5986, elevated from previous values, however, in the setting of CKD in addition to clinical findings, do not suspect CHF exacerbation is responsible for SOB. Patient did well on PO levaquin. He also completed a course of tamiflu x 5 days.  Constipation  Resolved with senna-dulcolax BID and miralax TID.   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 This appears stable at this time.  Diuretics were held (Zaroxolyn 2.5 mg po MWSa, and Torsemide 120 mg po qd), restarted on 3/30.  Acute on chronic kidney disease  Patient has CKD that's has been slowly progressive since 2008. His Cr increased on admission 2.8, then downtrended to 2.11. The etiology of the patients CKD is likely uncontrolled DM, HTN. AKI likely due to poor PO intake in setting of continued diuretic use. AKI appears to be resolved.   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 As he complains of chest pain, ACS was ruled out with serial troponins. Further, EKG demonstrated no dynamic ST/T wave changes. His chest pain is likely due to CAP. Home coreg, plavix, and statin were continued.  Depression/Anxiety  Patient reports medication non-compliance  with home meds (BZD chlorazepate 7.5 mg po in AM and 15 mg in PM). The patient received 15 mg chlorazepate at 230 am on 3/25. At 8:30 am on 3/25 he was unable to keep his eyes open. I suspect the AMS was 2/2 BZD. BZD were d/c and there were no signs with w.d. I d/c BZD on discharge.   Discharge Vitals:   BP 97/63  Pulse 72  Temp(Src) 97.9 F (36.6 C) (Oral)  Resp 18  Ht 6' (1.829 m)  Wt 208 lb (94.348 kg)  BMI 28.20 kg/m2  SpO2 99%  Discharge Labs:  Results for orders placed during the hospital encounter of 04/10/13 (from the past 24 hour(s))  BASIC METABOLIC PANEL     Status: Abnormal   Collection Time    04/15/13  1:55 PM      Result Value Ref Range   Sodium 133 (*) 137 - 147 mEq/L   Potassium 4.3  3.7 - 5.3 mEq/L   Chloride 92 (*) 96 - 112 mEq/L   CO2 29  19 - 32 mEq/L   Glucose, Bld 169 (*) 70 - 99 mg/dL   BUN 36 (*) 6 - 23 mg/dL   Creatinine, Ser 8.88 (*) 0.50 - 1.35 mg/dL   Calcium 8.9  8.4 - 75.7 mg/dL   GFR calc non Af Amer 36 (*) >90 mL/min   GFR calc Af Amer 42 (*) >90 mL/min  GLUCOSE, CAPILLARY     Status: Abnormal   Collection Time    04/15/13  4:31 PM      Result Value Ref Range   Glucose-Capillary 217 (*) 70 - 99 mg/dL  GLUCOSE, CAPILLARY     Status: Abnormal   Collection Time    04/15/13  8:23 PM      Result Value Ref Range   Glucose-Capillary 145 (*) 70 - 99 mg/dL  GLUCOSE, CAPILLARY     Status: Abnormal   Collection Time    04/16/13  8:56 AM      Result Value Ref Range   Glucose-Capillary 128 (*) 70 - 99 mg/dL  GLUCOSE, CAPILLARY     Status: Abnormal   Collection Time    04/16/13  1:11 PM      Result Value Ref Range   Glucose-Capillary 235 (*) 70 - 99 mg/dL    Signed: Pleas Koch, MD 04/16/2013, 1:37 PM   Time Spent on Discharge: 35 minutes Services Ordered on Discharge: None Equipment Ordered on Discharge: None

## 2013-04-16 NOTE — Progress Notes (Signed)
Subjective:  NAEON. Pt continues to have diffuse body aches that are similar to his chronic pains.   Objective: Vital signs in last 24 hours: Filed Vitals:   04/15/13 0511 04/15/13 1352 04/15/13 2100 04/16/13 0500  BP: 108/75 110/68 104/79 98/63  Pulse: 81 88 73 75  Temp: 98.5 F (36.9 C) 98.9 F (37.2 C) 97.4 F (36.3 C) 97.4 F (36.3 C)  TempSrc: Oral Oral    Resp: 16 18 18 18   Height:      Weight:    208 lb (94.348 kg)  SpO2: 99% 100% 100% 99%   Weight change:   Intake/Output Summary (Last 24 hours) at 04/16/13 0731 Last data filed at 04/16/13 0500  Gross per 24 hour  Intake      0 ml  Output   1000 ml  Net  -1000 ml   Physical Exam  Constitutional: He appears well-developed and well-nourished. No distress.  HENT:  Head: Normocephalic and atraumatic.  Cardiovascular: Normal rate, regular rhythm, normal heart sounds and intact distal pulses.  Exam reveals no friction rub.   No murmur heard. Pulmonary/Chest: Effort normal. No respiratory distress. He has no wheezes. He has no rales.  Musculoskeletal: He exhibits edema. He exhibits no tenderness.  Trace LE edema  Neurological: He is alert.  Skin: He is not diaphoretic.  Psychiatric: He has a normal mood and affect. His behavior is normal.    Lab Results: Basic Metabolic Panel:  Recent Labs Lab 04/11/13 0315 04/12/13 0516  04/14/13 0915 04/15/13 1355  NA 130* 132*  < > 134* 133*  K 3.4* 3.6*  < > 4.1 4.3  CL 89* 90*  < > 91* 92*  CO2 23 26  < > 29 29  GLUCOSE 187* 163*  < > 192* 169*  BUN 41* 54*  < > 47* 36*  CREATININE 2.40* 2.80*  < > 2.38* 2.11*  CALCIUM 8.6 9.0  < > 9.1 8.9  MG 1.7 2.3  --   --   --   < > = values in this interval not displayed. Liver Function Tests:  Recent Labs Lab 04/10/13 1850 04/13/13 0448  AST 16 17  ALT 7 6  ALKPHOS 77 65  BILITOT 2.9* 1.6*  PROT 7.6 6.9  ALBUMIN 3.1* 2.8*    Recent Labs Lab 04/10/13 1850  LIPASE 27   CBC:  Recent Labs Lab  04/10/13 1850 04/11/13 0315  WBC 5.2 5.2  NEUTROABS 3.7  --   HGB 15.8 15.2  HCT 44.4 43.5  MCV 83.8 84.5  PLT 163 155   Cardiac Enzymes:  Recent Labs Lab 04/11/13 0315 04/11/13 0725 04/11/13 1335  TROPONINI <0.30 <0.30 <0.30   BNP:  Recent Labs Lab 04/10/13 1850  PROBNP 5986.0*   CBG:  Recent Labs Lab 04/14/13 1256 04/14/13 1712 04/14/13 2133 04/15/13 1147 04/15/13 1631 04/15/13 2023  GLUCAP 159* 120* 210* 261* 217* 145*   Urine Drug Screen: Drugs of Abuse     Component Value Date/Time   LABOPIA NONE DETECTED 08/04/2012 1742   LABOPIA NEG 02/01/2012 1356   COCAINSCRNUR POSITIVE* 08/04/2012 1742   COCAINSCRNUR PPS 02/01/2012 1356   LABBENZ NONE DETECTED 08/04/2012 1742   LABBENZ PPS 02/01/2012 1356   LABBENZ NEG 07/09/2010 1522   AMPHETMU NONE DETECTED 08/04/2012 1742   AMPHETMU NEG 07/09/2010 1522   THCU NONE DETECTED 08/04/2012 1742   LABBARB NONE DETECTED 08/04/2012 1742   LABBARB NEG 02/01/2012 1356    Studies/Results: No results found. Medications:  I have reviewed the patient's current medications. Scheduled Meds: . atorvastatin  80 mg Oral Daily  . carvedilol  3.125 mg Oral BID WC  . clopidogrel  75 mg Oral Q breakfast  . digoxin  0.125 mg Oral Daily  . famotidine  20 mg Oral Daily  . heparin  5,000 Units Subcutaneous 3 times per day  . insulin aspart  0-5 Units Subcutaneous QHS  . insulin aspart  0-9 Units Subcutaneous TID WC  . insulin glargine  12 Units Subcutaneous QHS  . levofloxacin  750 mg Oral Q48H  . polyethylene glycol  17 g Oral TID  . promethazine  25 mg Intravenous Once  . senna-docusate  1 tablet Oral BID  . 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 Doing well on PO ABX. Similarly, this presentation is also consistent with influenza. As influenza PCR has poor sensitivity, plan to continue tamiflu PO for 5 days, which has now completed. - PO levaquin q 48 hr repeat EKG on 3/27 didn't show prolonged QT - PO tamiflu x 5 days, then d/c - HIV negative - S. pneumo (neg) and legionella negative - Restart diuretics - No narcs, pt only requesting dilaudid (pain seeking behavior).  Constipation Senna-dulcolax BID and miralax TID. 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 This appears stable at this time.  - Continue home dig as level was low - Continue home coreg - Continue to hold diuretics(Zaroxolyn 2.5 mg po MWSa, and Torsemide 120 mg po qd), restarted on 3/30 -Daily weights + I/O's  Acute on chronic kidney disease Patient has CKD that's has been slowly progressive since 2008. His Cr increased on admission 2.8, then downtrended to 2.11.  The etiology of the patients CKD is likely uncontrolled DM, HTN. AKI likely due to poor PO intake in setting of continued diuretic use. AKI appears to be resolved. 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 As he complains of chest pain, ACS was ruled out with serial troponins. Further, EKG demonstrated no dynamic ST/T wave changes. His chest pain is likely due to CAP.  - Continue home coreg, plavix, and statin  Depression/Anxiety Patient reports medication non-compliance with home meds (BZD chlorazepate 7.5 mg po in AM and 15 mg in PM).  The patient received 15 mg chlorazepate at 230 am on 3/25. At 8:30 am on 3/25 he was unable to keep his eyes open. I suspect the AMS was 2/2 BZD. I plan to d/c BZD and monitor patient for signs of w/d.  DM II Poorly controlled as outpatient. Home lantus qhs 15 U. Stable on admission. - SSI - 2/3 home dose(10 U) lantus this evening  HTN Stable. - Continue coreg, restart diuretics per above.  GERD Stable, continue home zantac.  Gout  Stable, continue home colchicine.  OSA CPAP  Myasthenia Gravis Appears stable. No reporteded home meds. Will observe w/o intervention at this time.   Dispo: Disposition is deferred at this time, awaiting improvement of current medical problems.  Anticipated discharge in approximately 1-2 day(s).   The patient does have a current PCP Darden Palmer, MD) and does need [facility] after discharge.  The patient does not know have transportation limitations that hinder transportation to clinic appointments.  .Services Needed at time of discharge: Y = Yes, Blank = No PT:   OT:   RN:   Equipment:   Other:     LOS: 6 days   Pleas Koch, MD 04/16/2013, 7:31 AM

## 2013-04-17 LAB — CULTURE, BLOOD (ROUTINE X 2)
CULTURE: NO GROWTH
Culture: NO GROWTH

## 2013-04-19 NOTE — Discharge Summary (Signed)
INTERNAL MEDICINE ATTENDING DISCHARGE COSIGN   I evaluated the patient on the day of discharge and discussed the discharge plan with my resident team. I agree with the discharge documentation and disposition.   Aletta Edouard 04/19/2013, 11:47 AM

## 2013-04-24 ENCOUNTER — Encounter: Payer: Medicare Other | Admitting: Internal Medicine

## 2013-04-24 ENCOUNTER — Encounter: Payer: Self-pay | Admitting: Internal Medicine

## 2013-04-29 DIAGNOSIS — I509 Heart failure, unspecified: Secondary | ICD-10-CM

## 2013-04-29 DIAGNOSIS — I5022 Chronic systolic (congestive) heart failure: Secondary | ICD-10-CM

## 2013-04-29 DIAGNOSIS — E1165 Type 2 diabetes mellitus with hyperglycemia: Secondary | ICD-10-CM

## 2013-04-29 DIAGNOSIS — E1129 Type 2 diabetes mellitus with other diabetic kidney complication: Secondary | ICD-10-CM

## 2013-04-29 DIAGNOSIS — J189 Pneumonia, unspecified organism: Secondary | ICD-10-CM

## 2013-05-01 ENCOUNTER — Telehealth: Payer: Self-pay | Admitting: *Deleted

## 2013-05-01 ENCOUNTER — Telehealth (HOSPITAL_COMMUNITY): Payer: Self-pay | Admitting: Cardiology

## 2013-05-01 NOTE — Telephone Encounter (Signed)
Pt called stating he has been having a low energy level and unable to have erections.  He saw an advertisement on TV stating these were Sx of low Testosterone.   He wanted info on testosterone and help with his problem.  Pt has a scheduled appointment next week, 4/23 and I advised him to speak with his doctor about the above. Pt stated he has several problems and his doctor never gets back with him.  He is concerned about a lung nodule.   Pt advised to speak with Doctor at next visit.   I noted several missed appointments.

## 2013-05-01 NOTE — Telephone Encounter (Signed)
PT CALLED TO MAKE SURE IT WAS OK TO BEGIN NATURAL SUPPLEMENT/VITAMIN UNSURE OF NAME HOWEVER DOES CONTAIN TESTOSTERONE  PER VO ALI COSGROVE, NP OK FOR PT TO START MEDS  PT AWARE

## 2013-05-01 NOTE — Telephone Encounter (Signed)
I agree, please have this issue addressed on his schedule appointment with Dr. Claudell Kyle, as he has limited resources and transportation and thus very important to keep the appointment.  Of note, I have spoken to [name] several times as per the documented phone notes. We have also discussed his nodules in detail and the proper follow up. Please see my prior documentation.   Perhaps we can give Dr. Claudell Kyle some extra time for this appointment given his reporting of multiple issues.   Thanks,  Dr q

## 2013-05-10 ENCOUNTER — Encounter: Payer: Self-pay | Admitting: Dietician

## 2013-05-10 ENCOUNTER — Ambulatory Visit: Payer: Medicare Other | Admitting: Internal Medicine

## 2013-05-10 ENCOUNTER — Encounter: Payer: Self-pay | Admitting: Licensed Clinical Social Worker

## 2013-05-10 ENCOUNTER — Ambulatory Visit (INDEPENDENT_AMBULATORY_CARE_PROVIDER_SITE_OTHER): Payer: Medicare Other | Admitting: Internal Medicine

## 2013-05-10 ENCOUNTER — Encounter: Payer: Self-pay | Admitting: Internal Medicine

## 2013-05-10 VITALS — BP 118/87 | HR 103 | Temp 98.0°F | Ht 72.0 in | Wt 221.7 lb

## 2013-05-10 DIAGNOSIS — E1129 Type 2 diabetes mellitus with other diabetic kidney complication: Secondary | ICD-10-CM

## 2013-05-10 DIAGNOSIS — N529 Male erectile dysfunction, unspecified: Secondary | ICD-10-CM

## 2013-05-10 DIAGNOSIS — G629 Polyneuropathy, unspecified: Secondary | ICD-10-CM

## 2013-05-10 DIAGNOSIS — I10 Essential (primary) hypertension: Secondary | ICD-10-CM

## 2013-05-10 DIAGNOSIS — G609 Hereditary and idiopathic neuropathy, unspecified: Secondary | ICD-10-CM

## 2013-05-10 DIAGNOSIS — E119 Type 2 diabetes mellitus without complications: Secondary | ICD-10-CM

## 2013-05-10 LAB — GLUCOSE, CAPILLARY: GLUCOSE-CAPILLARY: 528 mg/dL — AB (ref 70–99)

## 2013-05-10 LAB — POCT GLYCOSYLATED HEMOGLOBIN (HGB A1C): Hemoglobin A1C: 10.9

## 2013-05-10 MED ORDER — GABAPENTIN 300 MG PO CAPS: ORAL_CAPSULE | ORAL | Status: DC

## 2013-05-10 NOTE — Progress Notes (Signed)
[name] is known to this worker and the [facility].  Pt voiced to RN today during scheduled appt, he was to meet with financial counselor to assist with medicaid application during his hospitalization.  Pt was d/c prior to meeting with financial counselor.  [name] states he has over $19,000 in medical bills.  Pt has Medicare A,B, and D and receives SSDI monthly.  Pt requesting assistance with medicaid application process due to limited literacy.  CSW printed application and assisted pt with application.  CSW will inquire if [employer] can assist as well.

## 2013-05-10 NOTE — Progress Notes (Unsigned)
Patient ID: [name], male   DOB: [birth year], 48 y.o.   MRN: [id number] Lab brought to CDE attention that patient would like a blood glucose meter. He reports that home health company- [facility], was in the process of getting him a meter through mail order which he prefers over getting supplies from a local pharmacy. [facility] SW- [phone]. Alice Ward.
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
## 2013-05-10 NOTE — Patient Instructions (Addendum)
Start taking Neurontin as instructed. Use your CPAP every night as advised. Take all your medications as advised before.

## 2013-05-11 NOTE — Assessment & Plan Note (Signed)
Not sure the reason why gabapentin was discontinued. Discussed the case with Dr. Josem Kaufmann.  Plans: Restart Gabapentin. 300 mg QD on day, BID on day 2 , TID from day three . Patient violated pain contract in the past as he was positive for cocaine.

## 2013-05-11 NOTE — Assessment & Plan Note (Signed)
Well controlled.  Plans: Continue current meds. 

## 2013-05-11 NOTE — Assessment & Plan Note (Signed)
Patient c/o lack of sexual drive, lack of erections and requesting to check for testosterone levels.  Plans: Check total and free testosterone.

## 2013-05-11 NOTE — Assessment & Plan Note (Signed)
Uncontrolled DM. Patient reports that he doesn't have the strips to check his blood sugar and hence not checking his sugars.  Plans: Patient is given information of the agency to call for glucostrips. Recommended patient to check blood sugars 3-4 times daily: Before each meal and at bedtime. Recommended patient for a close follow up until cbg's are under control.

## 2013-05-13 NOTE — Progress Notes (Signed)
Case discussed with Dr. Boggala at time of visit.  We reviewed the resident's history and exam and pertinent patient test results.  I agree with the assessment, diagnosis, and plan of care documented in the resident's note. 

## 2013-05-14 NOTE — Progress Notes (Signed)
Stage manager accepted pt's medicaid application and will send in to DSS.  CSW provided completed application, financial counseling states they will attached medical bills.  Pt notified.

## 2013-06-13 ENCOUNTER — Other Ambulatory Visit: Payer: Self-pay | Admitting: Internal Medicine

## 2013-06-13 ENCOUNTER — Telehealth: Payer: Self-pay | Admitting: Licensed Clinical Social Worker

## 2013-06-13 ENCOUNTER — Other Ambulatory Visit (HOSPITAL_COMMUNITY): Payer: Self-pay | Admitting: Cardiology

## 2013-06-13 DIAGNOSIS — I509 Heart failure, unspecified: Secondary | ICD-10-CM

## 2013-06-13 MED ORDER — TORSEMIDE 20 MG PO TABS: 120.0000 mg | ORAL_TABLET | Freq: Every day | ORAL | Status: DC

## 2013-06-13 NOTE — Telephone Encounter (Signed)
CSW referred due to multiple requests for medication assistance. Patient reports he has SSD and Medicare. He states he is unsure of his Medicare D provider but does have prescription coverage. Patient states that he has 3 children and most of his money goes to household bills and grocery shopping. Patient confirms that he has $300 monthly in food stamps but that only lasts half a month. CSW encouraged patient to manage his money to incorporate his medication costs. Patient states his daughter is a picky eater and he makes different meals for her and her siblings. CSW encouraged patient to apply for medicaid to assist with prescription assistance and medical bills. Patient states that an application is already in process. CSW will continue to monitor and assist as needed.  Lasandra Beech, LCSW [phone]

## 2013-06-13 NOTE — Telephone Encounter (Signed)
PT CALLED WITH CONCERNS OF INCREASED FLUID, W/O MEDS X A COUPLE OF WEEKS PT STATES HIS WEIGHT IS UP AROUND 12-18 LBS REALLY HARD TO SAY INCREASED SOB, UNABLE TO LIE DOWN, INCREASED ABDOMINAL SWELLING STATES HE WOULD LIKE TO COME IN FOR LIQUID LASIX  PER VO DR. Gala Romney PT SHOULD RE START MEDS  ADD/TAKE METOLAZONE X 3 DAYS CALL OFFICE IF FURTHER CONCERNS  PT AWARE PT STATES HE IS UNABLE TO PAY FOR MEDS CALLED PHARMACY AND [place] PAY FOR METOLAZONE, TORSEMIDE, AND COREG $14.74 WILL WALK $ OVER TO PHARM, AND PHARM WILL DELIVER TO PTS HOME

## 2013-06-13 NOTE — Addendum Note (Signed)
Addended by: Arturo Morton on: 06/13/2013 04:56 PM   Modules accepted: Orders

## 2013-06-19 ENCOUNTER — Telehealth: Payer: Self-pay | Admitting: *Deleted

## 2013-06-19 ENCOUNTER — Other Ambulatory Visit: Payer: Self-pay | Admitting: Internal Medicine

## 2013-06-19 DIAGNOSIS — G629 Polyneuropathy, unspecified: Secondary | ICD-10-CM

## 2013-06-19 DIAGNOSIS — E1129 Type 2 diabetes mellitus with other diabetic kidney complication: Secondary | ICD-10-CM

## 2013-06-19 MED ORDER — GABAPENTIN 100 MG PO CAPS: ORAL_CAPSULE | ORAL | Status: DC

## 2013-06-19 NOTE — Telephone Encounter (Signed)
Call from pt - states Gabapentin is too strong; takes it 3 times a day; making it difficult to get his children to school in the mornings. He's too drowsy - this did not start until he started taking the Gabapentin. Thanks

## 2013-06-19 NOTE — Assessment & Plan Note (Signed)
Patient called the clinic and reports that the gabapentin is too strong for him. In the setting of CKD with his CrCl ranging between 30-60, we will decrease the strength as well as the frequency.  Plans: Change Gabapentin to 100 mg 2 tablets BID. Called patient on his home number and talked to him. Recommended to stop taking the Gabapentin 300 tablet and start taking the 200 mg BID. Patient is agreeable to the plan.

## 2013-06-19 NOTE — Telephone Encounter (Signed)
I will forward this to Dr. Comer Locket who last saw him and started the medication incase he wishes to change anything based on his visit that day. Thoughts?

## 2013-06-22 ENCOUNTER — Encounter: Payer: Self-pay | Admitting: Internal Medicine

## 2013-06-26 ENCOUNTER — Inpatient Hospital Stay (HOSPITAL_COMMUNITY)
Admission: EM | Admit: 2013-06-26 | Discharge: 2013-07-05 | DRG: 291 | Disposition: A | Discharge status: Hospice | Payer: Medicare Other | Attending: Internal Medicine | Admitting: Internal Medicine

## 2013-06-26 ENCOUNTER — Encounter (HOSPITAL_COMMUNITY): Payer: Self-pay | Admitting: Emergency Medicine

## 2013-06-26 ENCOUNTER — Ambulatory Visit: Admitting: Internal Medicine

## 2013-06-26 ENCOUNTER — Emergency Department (HOSPITAL_COMMUNITY): Payer: Medicare Other

## 2013-06-26 DIAGNOSIS — I5043 Acute on chronic combined systolic (congestive) and diastolic (congestive) heart failure: Secondary | ICD-10-CM | POA: Diagnosis not present

## 2013-06-26 DIAGNOSIS — IMO0001 Reserved for inherently not codable concepts without codable children: Secondary | ICD-10-CM | POA: Diagnosis present

## 2013-06-26 DIAGNOSIS — Z9119 Patient's noncompliance with other medical treatment and regimen: Secondary | ICD-10-CM

## 2013-06-26 DIAGNOSIS — F1010 Alcohol abuse, uncomplicated: Secondary | ICD-10-CM | POA: Diagnosis present

## 2013-06-26 DIAGNOSIS — Z8249 Family history of ischemic heart disease and other diseases of the circulatory system: Secondary | ICD-10-CM

## 2013-06-26 DIAGNOSIS — G43909 Migraine, unspecified, not intractable, without status migrainosus: Secondary | ICD-10-CM | POA: Diagnosis present

## 2013-06-26 DIAGNOSIS — Z87891 Personal history of nicotine dependence: Secondary | ICD-10-CM

## 2013-06-26 DIAGNOSIS — I5023 Acute on chronic systolic (congestive) heart failure: Principal | ICD-10-CM

## 2013-06-26 DIAGNOSIS — I5022 Chronic systolic (congestive) heart failure: Secondary | ICD-10-CM

## 2013-06-26 DIAGNOSIS — I428 Other cardiomyopathies: Secondary | ICD-10-CM | POA: Diagnosis not present

## 2013-06-26 DIAGNOSIS — R531 Weakness: Secondary | ICD-10-CM

## 2013-06-26 DIAGNOSIS — N189 Chronic kidney disease, unspecified: Secondary | ICD-10-CM

## 2013-06-26 DIAGNOSIS — Z515 Encounter for palliative care: Secondary | ICD-10-CM

## 2013-06-26 DIAGNOSIS — G4733 Obstructive sleep apnea (adult) (pediatric): Secondary | ICD-10-CM | POA: Diagnosis present

## 2013-06-26 DIAGNOSIS — Z7982 Long term (current) use of aspirin: Secondary | ICD-10-CM

## 2013-06-26 DIAGNOSIS — N183 Chronic kidney disease, stage 3 unspecified: Secondary | ICD-10-CM | POA: Diagnosis present

## 2013-06-26 DIAGNOSIS — E1165 Type 2 diabetes mellitus with hyperglycemia: Secondary | ICD-10-CM

## 2013-06-26 DIAGNOSIS — I472 Ventricular tachycardia, unspecified: Secondary | ICD-10-CM | POA: Diagnosis not present

## 2013-06-26 DIAGNOSIS — F329 Major depressive disorder, single episode, unspecified: Secondary | ICD-10-CM | POA: Diagnosis present

## 2013-06-26 DIAGNOSIS — M109 Gout, unspecified: Secondary | ICD-10-CM | POA: Diagnosis present

## 2013-06-26 DIAGNOSIS — I252 Old myocardial infarction: Secondary | ICD-10-CM

## 2013-06-26 DIAGNOSIS — E785 Hyperlipidemia, unspecified: Secondary | ICD-10-CM | POA: Diagnosis present

## 2013-06-26 DIAGNOSIS — K219 Gastro-esophageal reflux disease without esophagitis: Secondary | ICD-10-CM | POA: Diagnosis present

## 2013-06-26 DIAGNOSIS — Z8711 Personal history of peptic ulcer disease: Secondary | ICD-10-CM

## 2013-06-26 DIAGNOSIS — Z91199 Patient's noncompliance with other medical treatment and regimen due to unspecified reason: Secondary | ICD-10-CM

## 2013-06-26 DIAGNOSIS — I4729 Other ventricular tachycardia: Secondary | ICD-10-CM | POA: Diagnosis not present

## 2013-06-26 DIAGNOSIS — Z888 Allergy status to other drugs, medicaments and biological substances status: Secondary | ICD-10-CM

## 2013-06-26 DIAGNOSIS — Z833 Family history of diabetes mellitus: Secondary | ICD-10-CM

## 2013-06-26 DIAGNOSIS — Z79899 Other long term (current) drug therapy: Secondary | ICD-10-CM

## 2013-06-26 DIAGNOSIS — R000 Tachycardia, unspecified: Secondary | ICD-10-CM

## 2013-06-26 DIAGNOSIS — F3289 Other specified depressive episodes: Secondary | ICD-10-CM | POA: Diagnosis present

## 2013-06-26 DIAGNOSIS — I2510 Atherosclerotic heart disease of native coronary artery without angina pectoris: Secondary | ICD-10-CM | POA: Diagnosis present

## 2013-06-26 DIAGNOSIS — I129 Hypertensive chronic kidney disease with stage 1 through stage 4 chronic kidney disease, or unspecified chronic kidney disease: Secondary | ICD-10-CM | POA: Diagnosis present

## 2013-06-26 DIAGNOSIS — Z7901 Long term (current) use of anticoagulants: Secondary | ICD-10-CM

## 2013-06-26 DIAGNOSIS — Z794 Long term (current) use of insulin: Secondary | ICD-10-CM

## 2013-06-26 DIAGNOSIS — I509 Heart failure, unspecified: Secondary | ICD-10-CM

## 2013-06-26 DIAGNOSIS — Z95810 Presence of automatic (implantable) cardiac defibrillator: Secondary | ICD-10-CM

## 2013-06-26 DIAGNOSIS — E871 Hypo-osmolality and hyponatremia: Secondary | ICD-10-CM | POA: Diagnosis present

## 2013-06-26 DIAGNOSIS — F1410 Cocaine abuse, uncomplicated: Secondary | ICD-10-CM | POA: Diagnosis present

## 2013-06-26 DIAGNOSIS — Z91013 Allergy to seafood: Secondary | ICD-10-CM

## 2013-06-26 DIAGNOSIS — E876 Hypokalemia: Secondary | ICD-10-CM

## 2013-06-26 DIAGNOSIS — R570 Cardiogenic shock: Secondary | ICD-10-CM

## 2013-06-26 DIAGNOSIS — N179 Acute kidney failure, unspecified: Secondary | ICD-10-CM | POA: Diagnosis present

## 2013-06-26 LAB — I-STAT TROPONIN, ED: Troponin i, poc: 0.07 ng/mL (ref 0.00–0.08)

## 2013-06-26 LAB — COMPREHENSIVE METABOLIC PANEL
ALK PHOS: 71 U/L (ref 39–117)
ALT: 9 U/L (ref 0–53)
AST: 23 U/L (ref 0–37)
Albumin: 3 g/dL — ABNORMAL LOW (ref 3.5–5.2)
BILIRUBIN TOTAL: 1.8 mg/dL — AB (ref 0.3–1.2)
BUN: 58 mg/dL — ABNORMAL HIGH (ref 6–23)
CO2: 26 meq/L (ref 19–32)
Calcium: 9.4 mg/dL (ref 8.4–10.5)
Chloride: 85 mEq/L — ABNORMAL LOW (ref 96–112)
Creatinine, Ser: 2.46 mg/dL — ABNORMAL HIGH (ref 0.50–1.35)
GFR, EST AFRICAN AMERICAN: 35 mL/min — AB (ref >90)
GFR, EST NON AFRICAN AMERICAN: 30 mL/min — AB (ref >90)
GLUCOSE: 322 mg/dL — AB (ref 70–99)
POTASSIUM: 3.8 meq/L (ref 3.7–5.3)
Sodium: 131 mEq/L — ABNORMAL LOW (ref 137–147)
Total Protein: 7.5 g/dL (ref 6.0–8.3)

## 2013-06-26 LAB — CBC WITH DIFFERENTIAL/PLATELET
Basophils Absolute: 0 10*3/uL (ref 0.0–0.1)
Basophils Relative: 1 % (ref 0–1)
Eosinophils Absolute: 0.5 10*3/uL (ref 0.0–0.7)
Eosinophils Relative: 10 % — ABNORMAL HIGH (ref 0–5)
HCT: 47 % (ref 39.0–52.0)
Hemoglobin: 15.7 g/dL (ref 13.0–17.0)
LYMPHS ABS: 1.1 10*3/uL (ref 0.7–4.0)
LYMPHS PCT: 24 % (ref 12–46)
MCH: 29.2 pg (ref 26.0–34.0)
MCHC: 33.4 g/dL (ref 30.0–36.0)
MCV: 87.4 fL (ref 78.0–100.0)
Monocytes Absolute: 0.3 10*3/uL (ref 0.1–1.0)
Monocytes Relative: 6 % (ref 3–12)
NEUTROS PCT: 59 % (ref 43–77)
Neutro Abs: 2.8 10*3/uL (ref 1.7–7.7)
Platelets: 190 10*3/uL (ref 150–400)
RBC: 5.38 MIL/uL (ref 4.22–5.81)
RDW: 15.5 % (ref 11.5–15.5)
WBC: 4.8 10*3/uL (ref 4.0–10.5)

## 2013-06-26 LAB — PRO B NATRIURETIC PEPTIDE: Pro B Natriuretic peptide (BNP): 6208 pg/mL — ABNORMAL HIGH (ref 0–125)

## 2013-06-26 MED ORDER — FUROSEMIDE 10 MG/ML IJ SOLN
80.0000 mg | Freq: Once | INTRAMUSCULAR | Status: AC
  Administered 2013-06-27: 80 mg via INTRAVENOUS
  Filled 2013-06-26: qty 8

## 2013-06-26 NOTE — ED Notes (Addendum)
Per EMS: pt having chest pressure due to abdominal bloating. Pt states he has gained 30lbs 2 weeks. Pt has swelling to abdomen and bilateral legs. Pt given 324mg  of aspirin and 1 SL nitro with EMS. Pain decreased from 10/10 to 6/10 after SL nitro. Pt was told to double his turosomide (6) and to take his metolazone Mon Tues Wed. Pt states that he has not been able to get rid of the fluid.

## 2013-06-26 NOTE — ED Provider Notes (Addendum)
CSN: [id number]     Arrival date & time 06/26/13  2056 History   First MD Initiated Contact with Patient 06/26/13 2059     No chief complaint on file.    (Consider location/radiation/quality/duration/timing/severity/associated sxs/prior Treatment) Patient is a 48 y.o. male presenting with shortness of breath.  Shortness of Breath Severity:  Moderate Onset quality:  Gradual Duration:  3 weeks Timing:  Constant Progression:  Worsening Context: not activity, not fumes, not pollens and not URI   Relieved by:  None tried Worsened by:  Movement Associated symptoms: chest pain   Associated symptoms: no abdominal pain, no ear pain, no fever, no sputum production, no syncope and no vomiting     Past Medical History  Diagnosis Date  . Hypertension   . CHF (congestive heart failure)     a. 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 b. EF 20-25%, s/p St. Jude AICD placement 04/2007. c. Echo 06/2012: EF 10%.  . History of peptic ulcer disease     EGD 09/2007 shows prox gastric ulcer with black eschar, treated with PPI  . Depression   . History of alcohol abuse     quit approx 2011-2012  . OSA (obstructive sleep apnea)     Intolerant of CPAP  . ICD (implantable cardiac defibrillator) in place   . Diabetes mellitus     insulin dependent  . GERD (gastroesophageal reflux disease)   . Headache(784.0)   . Myasthenia gravis 10/12/2011    Diagnosed in 09/2011. Received Plasmapheresis. CT chest no Thymoma. Acetylcholine receptor antibody negative.    . CKD (chronic kidney disease) stage 3, GFR 30-59 ml/min   . Polysubstance abuse     Including cocaine (+ UDS multiple times in 2014)  . PUD (peptic ulcer disease)   . Pulmonary nodule     a. CT 09/2011.  Marland Kitchen CAD (coronary artery disease)     a. NSTEMI 06/2012: cath showed subtotal thrombotic occlusion of the distal OM1, too small for PCI, ?cocaine-triggered plaque rupture with thrombosis (no LV thrombus on f/u echo).    Past Surgical  History  Procedure Laterality Date  . Cardiac defibrillator placement  04/2007  . Knee surgery    . Skin grafts    . Facial reconstruction surgery    . Insertion of dialysis catheter  10/12/2011    temporary    Family History  Problem Relation Age of Onset  . Heart failure Mother   . Hypertension Mother   . Sarcoidosis Mother   . Diabetes type II Mother   . Heart attack Brother    History  Substance Use Topics  . Smoking status: Former Smoker    Types: Cigars    Quit date: 07/27/2007  . Smokeless tobacco: Former Neurosurgeon     Comment: Quit x 1 month.  . Alcohol Use: No     Comment: no alcohol since 2012    Review of Systems  Constitutional: Negative for fever.  HENT: Negative for congestion and ear pain.   Eyes: Negative for pain.  Respiratory: Positive for shortness of breath. Negative for sputum production.   Cardiovascular: Positive for chest pain. Negative for syncope.  Gastrointestinal: Negative for vomiting and abdominal pain.  Endocrine: Negative for polydipsia and polyuria.  Genitourinary: Negative for dysuria and urgency.  Skin: Negative for wound.      Allergies  Shellfish allergy; Tramadol; Adhesive; Tylenol; and Zoloft  Home Medications   Prior to Admission medications   Medication Sig Start Date End Date  Taking? Authorizing Provider  atorvastatin (LIPITOR) 40 MG tablet Take 80 mg by mouth daily.    Historical Provider, MD  carvedilol (COREG) 3.125 MG tablet Take 3.125 mg by mouth 2 (two) times daily with a meal.    Historical Provider, MD  clopidogrel (PLAVIX) 75 MG tablet Take 75 mg by mouth daily with breakfast.    Historical Provider, MD  colchicine 0.6 MG tablet Take 0.6 mg by mouth daily as needed (for gout).    Historical Provider, MD  digoxin (LANOXIN) 0.125 MG tablet Take 0.125 mg by mouth daily.    Historical Provider, MD  gabapentin (NEURONTIN) 100 MG capsule Take 2 tablets in the morning and 2 tablets in the evening. 06/19/13   Arturo MortonVijaya Boggala, MD   hydrOXYzine (ATARAX/VISTARIL) 25 MG tablet Take 50 mg by mouth at bedtime. Take 1 or 2 tablets by mouth 3 times daily as needed for itching.(Max of 6 tablets a day): For anxiety/itching 09/08/12   Ky BarbanSolianny D Kennerly, MD  insulin glargine (LANTUS) 100 UNIT/ML injection Inject 15 Units into the skin at bedtime.    Historical Provider, MD  levofloxacin (LEVAQUIN) 750 MG tablet Take 1 tablet (750 mg total) by mouth daily. 04/16/13   Pleas Kochhristopher Komanski, MD  metolazone (ZAROXOLYN) 2.5 MG tablet Take 2.5 mg by mouth 3 (three) times a week. Takes on Monday, Wednesday, and Saturday    Historical Provider, MD  potassium chloride SA (K-DUR,KLOR-CON) 20 MEQ tablet Take 40 mEq by mouth daily.    Historical Provider, MD  ranitidine (ZANTAC) 150 MG tablet Take 150 mg by mouth daily.    Historical Provider, MD  torsemide (DEMADEX) 20 MG tablet Take 6 tablets (120 mg total) by mouth daily. 06/13/13   Dolores Pattyaniel R Bensimhon, MD   There were no vitals taken for this visit. Physical Exam  Nursing note and vitals reviewed. Constitutional: He is oriented to person, place, and time. He appears well-developed and well-nourished.  HENT:  Head: Normocephalic and atraumatic.  Eyes: Pupils are equal, round, and reactive to light.  Neck: JVD present.  Cardiovascular: Regular rhythm.  Tachycardia present.   Pulmonary/Chest: Effort normal. He has rales.  Abdominal: He exhibits no distension. There is no tenderness.  Musculoskeletal: He exhibits edema.  Neurological: He is alert and oriented to person, place, and time.  Skin: Skin is warm and dry.    ED Course  Procedures (including critical care time) Labs Review Labs Reviewed - No data to display  Imaging Review No results found.   EKG Interpretation None      MDM   Final diagnoses:  None    48 yo M w/ CHF (recent EF 10%) on torsemide and zaroxyln here with 3 weeks of worsening sob, le edema, needing to use cpap more frequently. No fever, cough, other  resp. Symptoms. Exam with jvd, le edema, faint crackles. Tachycardic. otherwise stable. Likely chf exac. Less likely acs, pneumonia, ptx. Will check screening labs and cxr. dispo accordingly.   Labs and imaging consistent with acute chf exacebation, cardiology consulted. Will admit.     Marily MemosJason Mesner, MD 06/27/13 2056  Marily MemosJason Mesner, MD 07/11/13 1452   Addended note with Physical Exam - I agree.  I saw and evaluated the patient, reviewed the resident's note and I agree with the findings and plan.   EKG Interpretation   Date/Time:  Tuesday June 26 2013 20:58:56 EDT Ventricular Rate:  109 PR Interval:  189 QRS Duration: 109 QT Interval:  408 QTC Calculation: 549 R Axis:  94 Text Interpretation:  Sinus tachycardia Ventricular premature complex  Probable left atrial enlargement Borderline right axis deviation Abnormal  R-wave progression, late transition Nonspecific T abnormalities, inferior  leads Prolonged QT interval ED PHYSICIAN INTERPRETATION AVAILABLE IN [facility] Confirmed by TEST, Record ([id number]) on 06/28/2013 9:28:47 AM        Dagmar Hait, MD 07/24/13 [phone]

## 2013-06-26 NOTE — H&P (Signed)
Cardiology History and Physical  Jonathan Pitch, MD  History of Present Illness (and review of medical records): [name] is a 48 y.o. male who presents for evaluation of shortness of breath.  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 Current cocaine abuse. Cath in 2008 showed left circumflex with 40% stenosis. Baseline weight about 210. Was previously on [place] but this ended in December in 2013 as he was doing well.   He states he was without his medications for approximately one week at the end of May.  He has since had them refilled and compliant with therapy.  He presents with shortness of breath, dyspnea on exertion, increased lower extremity swelling, PND, orthopnea.  He has also had chest tightness associated with his difficulty breathing.  He has been having progressively worsening symptoms for past 2-3 weeks.  Patient weight up 20lbs since last medicine clinic visit in April 2015.  Previous admission history: Admitted 03/2011 with NYHA Class IV symptoms, requiring milrinone therapy and IV lasix. Cath 04/13/11: cardiogenic shock with biventricular failure (R>L) and possible restrictive physiology. RA = 22 with steep y-descents, RV = 45/12/25, PA = 51/25 (34), PCW = 18-20, Fick cardiac output/index = 3.1/1.4, Thermo CO/CI = 3.6/1.6, PVR = 4.8 Woods O2 sat = 85% (after sedation), PA sat = 31%, 36%. Milrinone was initiated for low output physiology.  Admitted to [facility] 06/2012 witih CP/elevated troponin and increased SOB. He had a +USD for cocaine. He also had LHC 07/17/12 showing subtotal thrombotic occlusion of the distal OM1. It was too small at this point for PCI and was started on plavix. Echo showed EF 10% with no LV thrombus and moderately decreased RV systolic function. His d/c weight was 206 and now 228 lbs, though this is lower than 230 which was his weight at last appointment in this office.  Admitted to [facility] 08/04/12 through 08/09/12 NSTEMI thought  to be from non st elevation MI from cocaine induced vasospasm and volume overload. + cocaine. Dry weight 205-208 pounds. Discharge weight 208 pounds.   Review of Systems Further review of systems was otherwise negative other than stated in HPI.  Past Medical History  Diagnosis Date  . Hypertension   . CHF (congestive heart failure)     a. 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 b. EF 20-25%, s/p [employer] AICD placement 04/2007. c. Echo 06/2012: EF 10%.  . History of peptic ulcer disease     EGD 09/2007 shows prox gastric ulcer with black eschar, treated with PPI  . Depression   . History of alcohol abuse     quit approx 2011-2012  . OSA (obstructive sleep apnea)     Intolerant of CPAP  . ICD (implantable cardiac defibrillator) in place   . Diabetes mellitus     insulin dependent  . GERD (gastroesophageal reflux disease)   . Headache(784.0)   . Myasthenia gravis 10/12/2011    Diagnosed in 09/2011. Received Plasmapheresis. CT chest no Thymoma. Acetylcholine receptor antibody negative.    . CKD (chronic kidney disease) stage 3, GFR 30-59 ml/min   . Polysubstance abuse     Including cocaine (+ UDS multiple times in 2014)  . PUD (peptic ulcer disease)   . Pulmonary nodule     a. CT 09/2011.  Marland Kitchen CAD (coronary artery disease)     a. NSTEMI 06/2012: cath showed subtotal thrombotic occlusion of the distal OM1, too small for PCI, ?cocaine-triggered plaque  rupture with thrombosis (no LV thrombus on f/u echo).     Past Surgical History  Procedure Laterality Date  . Cardiac defibrillator placement  04/2007  . Knee surgery    . Skin grafts    . Facial reconstruction surgery    . Insertion of dialysis catheter  10/12/2011    temporary     Prescriptions prior to admission  Medication Sig Dispense Refill  . atorvastatin (LIPITOR) 40 MG tablet Take 80 mg by mouth daily.      . carvedilol (COREG) 3.125 MG tablet Take 3.125 mg by mouth 2 (two) times daily with a meal.      .  digoxin (LANOXIN) 0.125 MG tablet Take 0.125 mg by mouth daily.      Marland Kitchen gabapentin (NEURONTIN) 100 MG capsule Take 2 tablets in the morning and 2 tablets in the evening.  120 capsule  1  . insulin glargine (LANTUS) 100 UNIT/ML injection Inject 15 Units into the skin at bedtime.      . metolazone (ZAROXOLYN) 2.5 MG tablet Take 2.5 mg by mouth 3 (three) times a week. Takes on Monday, Wednesday, and Saturday      . potassium chloride SA (K-DUR,KLOR-CON) 20 MEQ tablet Take 40 mEq by mouth daily.      . ranitidine (ZANTAC) 150 MG tablet Take 150 mg by mouth daily.      . rosuvastatin (CRESTOR) 10 MG tablet Take 10 mg by mouth daily.      Marland Kitchen torsemide (DEMADEX) 20 MG tablet Take 6 tablets (120 mg total) by mouth daily.  180 tablet  3   Allergies  Allergen Reactions  . Shellfish Allergy Anaphylaxis  . Tramadol     Causes seizures  . Adhesive [Tape] Hives  . Tylenol [Acetaminophen] Nausea Only  . Zoloft [Sertraline Hcl] Other (See Comments)    Talked to people in his sleep May 2013    History  Substance Use Topics  . Smoking status: Former Smoker    Types: Cigars    Quit date: 07/27/2007  . Smokeless tobacco: Former Systems developer     Comment: Quit x 1 month.  . Alcohol Use: No     Comment: no alcohol since 2012    Family History  Problem Relation Age of Onset  . Heart failure Mother   . Hypertension Mother   . Sarcoidosis Mother   . Diabetes type II Mother   . Heart attack Brother      Objective:  Patient Vitals for the past 8 hrs:  BP Temp Temp src Pulse Resp SpO2 Height Weight  06/27/13 0520 99/80 mmHg 97.4 F (36.3 C) Oral 93 18 99 % - -  06/27/13 0217 - - - 92 18 99 % - -  06/27/13 0202 118/72 mmHg 97.9 F (36.6 C) Oral 92 20 99 % 5' 11"  (1.803 m) 109.317 kg (241 lb)  06/26/13 2345 109/90 mmHg - - 105 22 100 % - -  06/26/13 2330 107/85 mmHg - - 102 20 100 % - -  06/26/13 2315 104/75 mmHg - - 95 21 100 % - -  06/26/13 2300 109/85 mmHg - - 96 13 96 % - -  06/26/13 2245 108/90 mmHg -  - 98 24 99 % - -   General appearance: alert, cooperative, appears stated age  Head: Normocephalic, without obvious abnormality, atraumatic Eyes: conjunctivae/corneas clear. PERRL, EOM's intact. Fundi benign. Neck: positive JVD Lungs: clear to auscultation bilaterally Chest wall: no tenderness Heart: tachycardia, no murmurs detected Abdomen: soft,  non-tender; bowel sounds normal; no masses,  no organomegaly Extremities: extremities normal, atraumatic, no cyanosis or edema Pulses: 2+ and symmetric Neurologic: Grossly normal  Results for orders placed during the hospital encounter of 06/26/13 (from the past 48 hour(s))  CBC WITH DIFFERENTIAL     Status: Abnormal   Collection Time    06/26/13  9:37 PM      Result Value Ref Range   WBC 4.8  4.0 - 10.5 K/uL   RBC 5.38  4.22 - 5.81 MIL/uL   Hemoglobin 15.7  13.0 - 17.0 g/dL   HCT 47.0  39.0 - 52.0 %   MCV 87.4  78.0 - 100.0 fL   MCH 29.2  26.0 - 34.0 pg   MCHC 33.4  30.0 - 36.0 g/dL   RDW 15.5  11.5 - 15.5 %   Platelets 190  150 - 400 K/uL   Neutrophils Relative % 59  43 - 77 %   Neutro Abs 2.8  1.7 - 7.7 K/uL   Lymphocytes Relative 24  12 - 46 %   Lymphs Abs 1.1  0.7 - 4.0 K/uL   Monocytes Relative 6  3 - 12 %   Monocytes Absolute 0.3  0.1 - 1.0 K/uL   Eosinophils Relative 10 (*) 0 - 5 %   Eosinophils Absolute 0.5  0.0 - 0.7 K/uL   Basophils Relative 1  0 - 1 %   Basophils Absolute 0.0  0.0 - 0.1 K/uL  COMPREHENSIVE METABOLIC PANEL     Status: Abnormal   Collection Time    06/26/13  9:37 PM      Result Value Ref Range   Sodium 131 (*) 137 - 147 mEq/L   Potassium 3.8  3.7 - 5.3 mEq/L   Chloride 85 (*) 96 - 112 mEq/L   CO2 26  19 - 32 mEq/L   Glucose, Bld 322 (*) 70 - 99 mg/dL   BUN 58 (*) 6 - 23 mg/dL   Creatinine, Ser 2.46 (*) 0.50 - 1.35 mg/dL   Calcium 9.4  8.4 - 10.5 mg/dL   Total Protein 7.5  6.0 - 8.3 g/dL   Albumin 3.0 (*) 3.5 - 5.2 g/dL   AST 23  0 - 37 U/L   ALT 9  0 - 53 U/L   Alkaline Phosphatase 71  39 -  117 U/L   Total Bilirubin 1.8 (*) 0.3 - 1.2 mg/dL   GFR calc non Af Amer 30 (*) >90 mL/min   GFR calc Af Amer 35 (*) >90 mL/min   Comment: (NOTE)     The eGFR has been calculated using the CKD EPI equation.     This calculation has not been validated in all clinical situations.     eGFR's persistently <90 mL/min signify possible Chronic Kidney     Disease.  PRO B NATRIURETIC PEPTIDE     Status: Abnormal   Collection Time    06/26/13  9:37 PM      Result Value Ref Range   Pro B Natriuretic peptide (BNP) 6208.0 (*) 0 - 125 pg/mL  I-STAT TROPOININ, ED     Status: None   Collection Time    06/26/13  9:53 PM      Result Value Ref Range   Troponin i, poc 0.07  0.00 - 0.08 ng/mL   Comment 3            Comment: Due to the release kinetics of cTnI,     a negative result within  the first hours     of the onset of symptoms does not rule out     myocardial infarction with certainty.     If myocardial infarction is still suspected,     repeat the test at appropriate intervals.  BASIC METABOLIC PANEL     Status: Abnormal   Collection Time    06/27/13  3:56 AM      Result Value Ref Range   Sodium 135 (*) 137 - 147 mEq/L   Potassium 3.6 (*) 3.7 - 5.3 mEq/L   Chloride 92 (*) 96 - 112 mEq/L   Comment: DELTA CHECK NOTED   CO2 29  19 - 32 mEq/L   Glucose, Bld 297 (*) 70 - 99 mg/dL   BUN 59 (*) 6 - 23 mg/dL   Creatinine, Ser 2.36 (*) 0.50 - 1.35 mg/dL   Calcium 9.2  8.4 - 10.5 mg/dL   GFR calc non Af Amer 32 (*) >90 mL/min   GFR calc Af Amer 37 (*) >90 mL/min   Comment: (NOTE)     The eGFR has been calculated using the CKD EPI equation.     This calculation has not been validated in all clinical situations.     eGFR's persistently <90 mL/min signify possible Chronic Kidney     Disease.  MAGNESIUM     Status: None   Collection Time    06/27/13  3:56 AM      Result Value Ref Range   Magnesium 2.1  1.5 - 2.5 mg/dL  PROTIME-INR     Status: Abnormal   Collection Time    06/27/13  3:56 AM       Result Value Ref Range   Prothrombin Time 17.0 (*) 11.6 - 15.2 seconds   INR 1.42  0.00 - 1.49  DIGOXIN LEVEL     Status: Abnormal   Collection Time    06/27/13  3:56 AM      Result Value Ref Range   Digoxin Level <0.3 (*) 0.8 - 2.0 ng/mL  TSH     Status: None   Collection Time    06/27/13  3:56 AM      Result Value Ref Range   TSH 2.920  0.350 - 4.500 uIU/mL  TROPONIN I     Status: None   Collection Time    06/27/13  3:56 AM      Result Value Ref Range   Troponin I <0.30  <0.30 ng/mL   Comment:            Due to the release kinetics of cTnI,     a negative result within the first hours     of the onset of symptoms does not rule out     myocardial infarction with certainty.     If myocardial infarction is still suspected,     repeat the test at appropriate intervals.   Dg Chest 2 View  06/26/2013   CLINICAL DATA:  Chest pain for 2 days. Shortness of breath for 1 week. Cough. Pain and swelling in feet.  EXAM: CHEST  2 VIEW  COMPARISON:  04/10/2013  FINDINGS: Single lead cardiac pacemaker. Cardiac enlargement without vascular congestion. No focal airspace disease or consolidation in the lungs. No blunting of costophrenic angles. No pneumothorax.  IMPRESSION: Cardiac enlargement.  No active cardiopulmonary disease.   Electronically Signed   By: Lucienne Capers M.D.   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 Cardiology Admission  2. Patient hemodynamically stable, will monitor closely for need of inotropic support 3. IV diuresis, Lasix bolus, followed by gtt 4. Strict I/Os, daily weights, 1.5 liter fluid restriction 5. Serial enzymes r/o MI, ASA, BB, Statin 6. Check Digoxin level, UDS 7. Basal Insulin, SSI 8. CPAP QHS

## 2013-06-27 DIAGNOSIS — I5023 Acute on chronic systolic (congestive) heart failure: Principal | ICD-10-CM

## 2013-06-27 DIAGNOSIS — I509 Heart failure, unspecified: Secondary | ICD-10-CM | POA: Insufficient documentation

## 2013-06-27 LAB — PROTIME-INR
INR: 1.42 (ref 0.00–1.49)
PROTHROMBIN TIME: 17 s — AB (ref 11.6–15.2)

## 2013-06-27 LAB — RAPID URINE DRUG SCREEN, HOSP PERFORMED
Amphetamines: NOT DETECTED
BARBITURATES: NOT DETECTED
Benzodiazepines: NOT DETECTED
Cocaine: POSITIVE — AB
Opiates: NOT DETECTED
Tetrahydrocannabinol: NOT DETECTED

## 2013-06-27 LAB — GLUCOSE, CAPILLARY
GLUCOSE-CAPILLARY: 268 mg/dL — AB (ref 70–99)
Glucose-Capillary: 220 mg/dL — ABNORMAL HIGH (ref 70–99)
Glucose-Capillary: 223 mg/dL — ABNORMAL HIGH (ref 70–99)
Glucose-Capillary: 248 mg/dL — ABNORMAL HIGH (ref 70–99)

## 2013-06-27 LAB — TROPONIN I
Troponin I: <0.3 ng/mL (ref <0.30)
Troponin I: <0.3 ng/mL (ref <0.30)

## 2013-06-27 LAB — BASIC METABOLIC PANEL
BUN: 59 mg/dL — ABNORMAL HIGH (ref 6–23)
CALCIUM: 9.2 mg/dL (ref 8.4–10.5)
CO2: 29 mEq/L (ref 19–32)
Chloride: 92 mEq/L — ABNORMAL LOW (ref 96–112)
Creatinine, Ser: 2.36 mg/dL — ABNORMAL HIGH (ref 0.50–1.35)
GFR calc Af Amer: 37 mL/min — ABNORMAL LOW (ref >90)
GFR calc non Af Amer: 32 mL/min — ABNORMAL LOW (ref >90)
Glucose, Bld: 297 mg/dL — ABNORMAL HIGH (ref 70–99)
Potassium: 3.6 mEq/L — ABNORMAL LOW (ref 3.7–5.3)
Sodium: 135 mEq/L — ABNORMAL LOW (ref 137–147)

## 2013-06-27 LAB — TSH: TSH: 2.92 u[IU]/mL (ref 0.350–4.500)

## 2013-06-27 LAB — DIGOXIN LEVEL: Digoxin Level: <0.3 ng/mL — ABNORMAL LOW (ref 0.8–2.0)

## 2013-06-27 LAB — MAGNESIUM: Magnesium: 2.1 mg/dL (ref 1.5–2.5)

## 2013-06-27 MED ORDER — HEPARIN SODIUM (PORCINE) 5000 UNIT/ML IJ SOLN
5000.0000 [IU] | Freq: Three times a day (TID) | INTRAMUSCULAR | Status: DC
  Administered 2013-06-27 – 2013-06-30 (×10): 5000 [IU] via SUBCUTANEOUS
  Filled 2013-06-27 (×28): qty 1

## 2013-06-27 MED ORDER — INSULIN ASPART 100 UNIT/ML ~~LOC~~ SOLN
0.0000 [IU] | Freq: Three times a day (TID) | SUBCUTANEOUS | Status: DC
  Administered 2013-06-27 (×3): 3 [IU] via SUBCUTANEOUS
  Administered 2013-06-28 (×2): 2 [IU] via SUBCUTANEOUS
  Administered 2013-06-28: 3 [IU] via SUBCUTANEOUS
  Administered 2013-06-29 (×3): 2 [IU] via SUBCUTANEOUS
  Administered 2013-06-30 (×2): 3 [IU] via SUBCUTANEOUS
  Administered 2013-07-01: 5 [IU] via SUBCUTANEOUS
  Administered 2013-07-01 – 2013-07-02 (×2): 9 [IU] via SUBCUTANEOUS
  Administered 2013-07-02: 5 [IU] via SUBCUTANEOUS

## 2013-06-27 MED ORDER — INSULIN GLARGINE 100 UNIT/ML ~~LOC~~ SOLN
15.0000 [IU] | Freq: Every day | SUBCUTANEOUS | Status: DC
  Administered 2013-06-27 – 2013-07-02 (×6): 15 [IU] via SUBCUTANEOUS
  Filled 2013-06-27 (×8): qty 0.15

## 2013-06-27 MED ORDER — SODIUM CHLORIDE 0.9 % IJ SOLN
3.0000 mL | Freq: Two times a day (BID) | INTRAMUSCULAR | Status: DC
Start: 2013-06-27 — End: 2013-07-05
  Administered 2013-06-27 – 2013-06-29 (×3): 3 mL via INTRAVENOUS
  Administered 2013-06-29: 10:00:00 via INTRAVENOUS
  Administered 2013-06-30 – 2013-07-04 (×5): 3 mL via INTRAVENOUS

## 2013-06-27 MED ORDER — INSULIN ASPART 100 UNIT/ML ~~LOC~~ SOLN
0.0000 [IU] | Freq: Every day | SUBCUTANEOUS | Status: DC
  Administered 2013-06-27: 3 [IU] via SUBCUTANEOUS
  Administered 2013-06-28: 2 [IU] via SUBCUTANEOUS
  Administered 2013-06-30: 3 [IU] via SUBCUTANEOUS
  Administered 2013-07-01: 5 [IU] via SUBCUTANEOUS
  Administered 2013-07-02 – 2013-07-04 (×2): 4 [IU] via SUBCUTANEOUS

## 2013-06-27 MED ORDER — ASPIRIN EC 81 MG PO TBEC
81.0000 mg | DELAYED_RELEASE_TABLET | Freq: Every day | ORAL | Status: DC
  Administered 2013-06-27 – 2013-07-05 (×9): 81 mg via ORAL
  Filled 2013-06-27 (×9): qty 1

## 2013-06-27 MED ORDER — SODIUM CHLORIDE 0.9 % IV SOLN: 250.0000 mL | INTRAVENOUS | Status: DC | PRN

## 2013-06-27 MED ORDER — MILRINONE IN DEXTROSE 20 MG/100ML IV SOLN
0.3750 ug/kg/min | INTRAVENOUS | Status: DC
  Administered 2013-06-27 – 2013-06-28 (×3): 0.25 ug/kg/min via INTRAVENOUS
  Administered 2013-06-29 (×2): 0.375 ug/kg/min via INTRAVENOUS
  Administered 2013-06-29: 0.25 ug/kg/min via INTRAVENOUS
  Administered 2013-06-30 – 2013-07-02 (×6): 0.375 ug/kg/min via INTRAVENOUS
  Filled 2013-06-27 (×12): qty 100

## 2013-06-27 MED ORDER — POTASSIUM CHLORIDE CRYS ER 20 MEQ PO TBCR
40.0000 meq | EXTENDED_RELEASE_TABLET | Freq: Once | ORAL | Status: AC
  Administered 2013-06-27: 40 meq via ORAL
  Filled 2013-06-27 (×2): qty 2

## 2013-06-27 MED ORDER — DEXTROSE 5 % IV SOLN
20.0000 mg/h | INTRAVENOUS | Status: DC
  Administered 2013-06-27: 8 mg/h via INTRAVENOUS
  Administered 2013-06-27 – 2013-06-28 (×2): 15 mg/h via INTRAVENOUS
  Administered 2013-06-29 – 2013-07-01 (×5): 20 mg/h via INTRAVENOUS
  Filled 2013-06-27 (×15): qty 25

## 2013-06-27 MED ORDER — FAMOTIDINE 20 MG PO TABS
20.0000 mg | ORAL_TABLET | Freq: Every day | ORAL | Status: DC
  Administered 2013-06-27 – 2013-07-05 (×9): 20 mg via ORAL
  Filled 2013-06-27 (×9): qty 1

## 2013-06-27 MED ORDER — ATORVASTATIN CALCIUM 80 MG PO TABS
80.0000 mg | ORAL_TABLET | Freq: Every day | ORAL | Status: DC
  Administered 2013-06-27 – 2013-07-04 (×8): 80 mg via ORAL
  Filled 2013-06-27 (×9): qty 1

## 2013-06-27 MED ORDER — GABAPENTIN 100 MG PO CAPS
200.0000 mg | ORAL_CAPSULE | Freq: Two times a day (BID) | ORAL | Status: DC
  Administered 2013-06-27: 200 mg via ORAL
  Filled 2013-06-27 (×2): qty 2

## 2013-06-27 MED ORDER — FUROSEMIDE 10 MG/ML IJ SOLN
80.0000 mg | Freq: Once | INTRAMUSCULAR | Status: AC
  Administered 2013-06-27: 80 mg via INTRAVENOUS
  Filled 2013-06-27 (×2): qty 8

## 2013-06-27 MED ORDER — SODIUM CHLORIDE 0.9 % IJ SOLN
3.0000 mL | INTRAMUSCULAR | Status: DC | PRN
  Administered 2013-06-30 – 2013-07-02 (×2): 3 mL via INTRAVENOUS

## 2013-06-27 MED ORDER — CARVEDILOL 3.125 MG PO TABS
3.1250 mg | ORAL_TABLET | Freq: Two times a day (BID) | ORAL | Status: DC
  Administered 2013-06-27: 3.125 mg via ORAL
  Filled 2013-06-27 (×3): qty 1

## 2013-06-27 NOTE — Progress Notes (Signed)
Advanced Heart Failure Rounding Note  PCP: [facility] (Dr. Loistine Chance) Psychiatrist: Dr. Anthony Sar Subjective:    [name] is a 48 y/o male with HTN, DM2, longstanding HF due to NICM s/p ICD, (probably related to ETOH/drug abuse), CAD (thombotic occlusion of the distal OM1 too small for PCI and was started on plavix 06/2012) , CKD stage III, OSA, drug abuse and noncompliance.   He has not been seen in the HF clinic since 02/2013. Admitted in 03/2013 for CP and cough and was treated for CAP. Reports he has been out of medications however we provided them for him and paid for them last month. Called clinic a couple weeks back and was instructed to take metolazone for a couple of days.   Presented to the ED with weight gain and SOB. Weight is up about 30 lbs. +DOE and orthopnea.  Reports no appetite. Reports he has not bee on Plavix.    Objective:   Weight Range:  Vital Signs:   Temp:  [97.4 F (36.3 C)-99.8 F (37.7 C)] 97.4 F (36.3 C) (06/10 0520) Pulse Rate:  [88-107] 88 (06/10 1424) Resp:  [13-24] 18 (06/10 1424) BP: (86-118)/(61-90) 104/71 mmHg (06/10 1424) SpO2:  [94 %-100 %] 100 % (06/10 1424) Weight:  [241 lb (109.317 kg)-242 lb (109.77 kg)] 241 lb (109.317 kg) (06/10 0202)    Weight change: Filed Weights   06/26/13 2100 06/27/13 0202  Weight: 242 lb (109.77 kg) 241 lb (109.317 kg)    Intake/Output:   Intake/Output Summary (Last 24 hours) at 06/27/13 1635 Last data filed at 06/27/13 1030  Gross per 24 hour  Intake    240 ml  Output    400 ml  Net   -160 ml     Physical Exam: General:  Chronically ill appearing. Fatigued No resp difficulty HEENT: normal Neck: supple. JVP to ear. Carotids 2+ bilat; no bruits. No lymphadenopathy or thryomegaly appreciated. Cor: PMI normal. Regular rate & rhythm. 2/6 systolic murmur LLSB. + S3 and RV lift Lungs: clear Abdomen: soft, tender, mildy distended. No hepatosplenomegaly. No bruits or masses. Good bowel  sounds. Extremities: no cyanosis, clubbing, rash, 2+ bilateral edema Neuro: alert & orientedx3, cranial nerves grossly intact. moves all 4 extremities w/o difficulty. Affect pleasant  Telemetry: SR 80s  Labs: Basic Metabolic Panel:  Recent Labs Lab 06/26/13 2137 06/27/13 0356  NA 131* 135*  K 3.8 3.6*  CL 85* 92*  CO2 26 29  GLUCOSE 322* 297*  BUN 58* 59*  CREATININE 2.46* 2.36*  CALCIUM 9.4 9.2  MG  --  2.1    Liver Function Tests:  Recent Labs Lab 06/26/13 2137  AST 23  ALT 9  ALKPHOS 71  BILITOT 1.8*  PROT 7.5  ALBUMIN 3.0*   No results found for this basename: LIPASE, AMYLASE,  in the last 168 hours No results found for this basename: AMMONIA,  in the last 168 hours  CBC:  Recent Labs Lab 06/26/13 2137  WBC 4.8  NEUTROABS 2.8  HGB 15.7  HCT 47.0  MCV 87.4  PLT 190    Cardiac Enzymes:  Recent Labs Lab 06/27/13 0356 06/27/13 0758  TROPONINI <0.30 <0.30    BNP: BNP (last 3 results)  Recent Labs  12/11/12 1021 04/10/13 1850 06/26/13 2137  PROBNP 2957.0* 5986.0* 6208.0*     Other results:  EKG: ST 109 bpm  Imaging: Dg Chest 2 View  06/26/2013   CLINICAL DATA:  Chest pain for 2 days. Shortness of breath for  1 week. Cough. Pain and swelling in feet.  EXAM: CHEST  2 VIEW  COMPARISON:  04/10/2013  FINDINGS: Single lead cardiac pacemaker. Cardiac enlargement without vascular congestion. No focal airspace disease or consolidation in the lungs. No blunting of costophrenic angles. No pneumothorax.  IMPRESSION: Cardiac enlargement.  No active cardiopulmonary disease.   Electronically Signed   By: Burman NievesWilliam  Stevens M.D.   On: 06/26/2013 22:40      Medications:     Scheduled Medications: . aspirin EC  81 mg Oral Daily  . atorvastatin  80 mg Oral q1800  . carvedilol  3.125 mg Oral BID WC  . famotidine  20 mg Oral Daily  . gabapentin  200 mg Oral BID  . heparin  5,000 Units Subcutaneous 3 times per day  . insulin aspart  0-5 Units  Subcutaneous QHS  . insulin aspart  0-9 Units Subcutaneous TID WC  . insulin glargine  15 Units Subcutaneous QHS  . sodium chloride  3 mL Intravenous Q12H     Infusions: . furosemide (LASIX) infusion 8 mg/hr (06/27/13 0602)     PRN Medications:  sodium chloride, sodium chloride   Assessment:   1) A/C systolic HF - EF 09%10%, RV mildly dilated and systolic fx moderately reduced 2) CAD - thombotic occlusion of the distal OM1 too small for PCI and was started on plavix 06/2012 3) CKD stage III - baseline Creatinine 2.3-2.5 4) HTN 5) Polysubstance abuse  Plan/Discussion:    [name] is well known to the HF team and was last seen in the HF clinic 02/2013. He has end-stage HF and presents today with markedly volume overloaded likely in the setting of non-compliance (was out of medications for about a week).  Was started on lasix gtt last night and has had minimal UOP. He has needed milrinone in the past and he does have an S3 and RV lift. Will start milrinone to help facilitate diuresis and to support his CO. May need PICC line, but will hold off currently. Place TED hose. Will stop BB. Not on an ACE-I or Spiro with CKD. Will not repeat ECHO will not change therapy.   Consult CR for ambulation.   Length of Stay: 1 Aundria RudCosgrove, Ali B NP-C 06/27/2013, 4:35 PM  Advanced Heart Failure Team Pager [phone] (M-F; 7a - 4p)  Please contact [facility] for night-coverage after hours (4p -7a ) and weekends on [email or web address]  Patient seen and examined with Ulla PotashAli Cosgrove, NP. We discussed all aspects of the encounter. I agree with the assessment and plan as stated above. He has low output HF with massive volume overload not responding to IV lasix. Agree with milrinone and lasix gtt. Respiratory status stable. Can place central access and move to SDU as needed.  Truman Haywardaniel Anshul Meddings,MD 6:26 PM

## 2013-06-27 NOTE — Progress Notes (Signed)
Alert and oriented. Lying in bed resting quietly. No sob noted. Lower extremities edema 3+.Pt independent. No complaints.

## 2013-06-27 NOTE — Progress Notes (Signed)
UR Completed.  Lewi Drost Jane [phone] 06/27/2013  

## 2013-06-27 NOTE — Progress Notes (Signed)
Admitted pt from ED per stretcher, assisted by the Nurse tech. Alert, oriented x 4 and ambulatory. Put on telemetry. Advised on strict I&O's. Dr. Terressa Koyanagi was notified with this admission. Will continue to monitor pt

## 2013-06-27 NOTE — Progress Notes (Signed)
18:09 patient had 8 beats of V Tach, no c/o cp, dizziness, v/s stable bp 121/86, hr. 93, 95%/RA. MD notified. Start milrinone on patient per order. Will continue to monitor patient.

## 2013-06-28 ENCOUNTER — Inpatient Hospital Stay (HOSPITAL_COMMUNITY): Payer: Medicare Other

## 2013-06-28 DIAGNOSIS — I509 Heart failure, unspecified: Secondary | ICD-10-CM

## 2013-06-28 DIAGNOSIS — R570 Cardiogenic shock: Secondary | ICD-10-CM

## 2013-06-28 LAB — GLUCOSE, CAPILLARY
GLUCOSE-CAPILLARY: 177 mg/dL — AB (ref 70–99)
Glucose-Capillary: 172 mg/dL — ABNORMAL HIGH (ref 70–99)
Glucose-Capillary: 206 mg/dL — ABNORMAL HIGH (ref 70–99)

## 2013-06-28 LAB — CARBOXYHEMOGLOBIN
Carboxyhemoglobin: 1.7 % — ABNORMAL HIGH (ref 0.5–1.5)
METHEMOGLOBIN: 0.7 % (ref 0.0–1.5)
O2 Saturation: 64.8 %
Total hemoglobin: 15.7 g/dL (ref 13.5–18.0)

## 2013-06-28 LAB — MAGNESIUM: Magnesium: 2 mg/dL (ref 1.5–2.5)

## 2013-06-28 LAB — BASIC METABOLIC PANEL
BUN: 56 mg/dL — ABNORMAL HIGH (ref 6–23)
CO2: 27 meq/L (ref 19–32)
CREATININE: 2.48 mg/dL — AB (ref 0.50–1.35)
Calcium: 8.9 mg/dL (ref 8.4–10.5)
Chloride: 91 mEq/L — ABNORMAL LOW (ref 96–112)
GFR calc Af Amer: 34 mL/min — ABNORMAL LOW (ref >90)
GFR calc non Af Amer: 30 mL/min — ABNORMAL LOW (ref >90)
GLUCOSE: 186 mg/dL — AB (ref 70–99)
Potassium: 3.4 mEq/L — ABNORMAL LOW (ref 3.7–5.3)
SODIUM: 135 meq/L — AB (ref 137–147)

## 2013-06-28 MED ORDER — ACETAMINOPHEN 325 MG PO TABS
650.0000 mg | ORAL_TABLET | Freq: Four times a day (QID) | ORAL | Status: DC | PRN
  Filled 2013-06-28: qty 2

## 2013-06-28 MED ORDER — POTASSIUM CHLORIDE CRYS ER 20 MEQ PO TBCR
40.0000 meq | EXTENDED_RELEASE_TABLET | Freq: Once | ORAL | Status: AC
  Administered 2013-06-28: 40 meq via ORAL
  Filled 2013-06-28: qty 2

## 2013-06-28 MED ORDER — METOLAZONE 5 MG PO TABS
5.0000 mg | ORAL_TABLET | Freq: Every day | ORAL | Status: DC
  Administered 2013-06-28 – 2013-06-29 (×2): 5 mg via ORAL
  Filled 2013-06-28 (×2): qty 1

## 2013-06-28 MED ORDER — FENTANYL CITRATE 0.05 MG/ML IJ SOLN
50.0000 ug | Freq: Once | INTRAMUSCULAR | Status: AC
  Administered 2013-06-28: 50 ug via INTRAVENOUS

## 2013-06-28 MED ORDER — FENTANYL CITRATE 0.05 MG/ML IJ SOLN
INTRAMUSCULAR | Status: AC
  Filled 2013-06-28: qty 2

## 2013-06-28 MED ORDER — MIDAZOLAM HCL 2 MG/2ML IJ SOLN
INTRAMUSCULAR | Status: AC
  Administered 2013-06-28: 2 mg
  Filled 2013-06-28: qty 2

## 2013-06-28 MED ORDER — OXYCODONE HCL 5 MG PO TABS
5.0000 mg | ORAL_TABLET | Freq: Once | ORAL | Status: AC
  Administered 2013-06-28: 5 mg via ORAL
  Filled 2013-06-28: qty 1

## 2013-06-28 MED ORDER — FENTANYL CITRATE 0.05 MG/ML IJ SOLN
INTRAMUSCULAR | Status: AC
  Administered 2013-06-28: 25 ug
  Filled 2013-06-28: qty 2

## 2013-06-28 NOTE — Procedures (Signed)
Central Venous Catheter Insertion Procedure Note HAIDER CARRERA [id number] November 26, 1965  Procedure: Insertion of Central Venous Catheter Indications: Assessment of intravascular volume  Procedure Details Consent: Risks of procedure as well as the alternatives and risks of each were explained to the (patient/caregiver).  Consent for procedure obtained. Time Out: Verified patient identification, verified procedure, site/side was marked, verified correct patient position, special equipment/implants available, medications/allergies/relevent history reviewed, required imaging and test results available.  Performed  Maximum sterile technique was used including antiseptics, cap, gloves, gown, hand hygiene, mask and sheet. Skin prep: Chlorhexidine; local anesthetic administered A antimicrobial bonded/coated triple lumen catheter was placed in the right internal jugular vein using the Seldinger technique.  Evaluation Blood flow good Complications: No apparent complications Patient did tolerate procedure well. Chest X-ray ordered to verify placement.  CXR: pending.  Daniel Bensimhon 06/28/2013, 8:50 PM

## 2013-06-28 NOTE — Progress Notes (Signed)
Advanced Heart Failure Rounding Note  PCP: [facility] (Dr. Loistine ChanceIllath) Psychiatrist: Dr. Anthony SarPolowski Subjective:    Mr. Georgina PillionMassey is a 48 y/o male with HTN, DM2, longstanding HF due to NICM s/p ICD, (probably related to ETOH/drug abuse), CAD (thombotic occlusion of the distal OM1 too small for PCI and was started on plavix 06/2012) , CKD stage III, OSA, drug abuse and noncompliance.   He has not been seen in the HF clinic since 02/2013. Admitted in 03/2013 for CP and cough and was treated for CAP. Reports he has been out of medications however we provided them for him and paid for them last month. Called clinic a couple weeks back and was instructed to take metolazone for a couple of days.   Presented to the ED with weight gain and SOB 6/9.Weight is up about 30 lbs.   Started on IV milrinone and lasix gtt last night with sluggish UOP. Weight stable. 24 hr I/O -57 cc. +DOE and orthopnea. Patient having brief runs Vtach. +UDS cocaine.   Objective:   Weight Range:  Vital Signs:   Temp:  [97.2 F (36.2 C)-98.2 F (36.8 C)] 98.2 F (36.8 C) (06/11 0218) Pulse Rate:  [80-98] 98 (06/11 0712) Resp:  [18] 18 (06/11 0712) BP: (98-121)/(70-86) 99/78 mmHg (06/11 0712) SpO2:  [95 %-100 %] 99 % (06/11 0712) Weight:  [241 lb 13.5 oz (109.7 kg)] 241 lb 13.5 oz (109.7 kg) (06/11 0712) Last BM Date: 06/27/13  Weight change: Filed Weights   06/26/13 2100 06/27/13 0202 06/28/13 0712  Weight: 242 lb (109.77 kg) 241 lb (109.317 kg) 241 lb 13.5 oz (109.7 kg)    Intake/Output:   Intake/Output Summary (Last 24 hours) at 06/28/13 1122 Last data filed at 06/28/13 0857  Gross per 24 hour  Intake 1224.04 ml  Output   1401 ml  Net -176.96 ml     Physical Exam: General:  Chronically ill appearing. Fatigued No resp difficulty HEENT: normal Neck: supple. JVP to ear. Carotids 2+ bilat; no bruits. No lymphadenopathy or thryomegaly appreciated. Cor: PMI normal. Regular rate & rhythm. 2/6 systolic  murmur LLSB. + S3 and RV lift Lungs: clear Abdomen: soft, tender, mildy distended. No hepatosplenomegaly. No bruits or masses. Good bowel sounds. Extremities: no cyanosis, clubbing, rash, 2+ bilateral edema Neuro: alert & orientedx3, cranial nerves grossly intact. moves all 4 extremities w/o difficulty. Affect pleasant  Telemetry: SR 90s  Labs: Basic Metabolic Panel:  Recent Labs Lab 06/26/13 2137 06/27/13 0356 06/28/13 0555  NA 131* 135* 135*  K 3.8 3.6* 3.4*  CL 85* 92* 91*  CO2 26 29 27   GLUCOSE 322* 297* 186*  BUN 58* 59* 56*  CREATININE 2.46* 2.36* 2.48*  CALCIUM 9.4 9.2 8.9  MG  --  2.1 2.0    Liver Function Tests:  Recent Labs Lab 06/26/13 2137  AST 23  ALT 9  ALKPHOS 71  BILITOT 1.8*  PROT 7.5  ALBUMIN 3.0*   No results found for this basename: LIPASE, AMYLASE,  in the last 168 hours No results found for this basename: AMMONIA,  in the last 168 hours  CBC:  Recent Labs Lab 06/26/13 2137  WBC 4.8  NEUTROABS 2.8  HGB 15.7  HCT 47.0  MCV 87.4  PLT 190    Cardiac Enzymes:  Recent Labs Lab 06/27/13 0356 06/27/13 0758  TROPONINI <0.30 <0.30    BNP: BNP (last 3 results)  Recent Labs  12/11/12 1021 04/10/13 1850 06/26/13 2137  PROBNP 2957.0* 5986.0* 6208.0*  Other results:  EKG: ST 109 bpm  Imaging: Dg Chest 2 View  06/26/2013   CLINICAL DATA:  Chest pain for 2 days. Shortness of breath for 1 week. Cough. Pain and swelling in feet.  EXAM: CHEST  2 VIEW  COMPARISON:  04/10/2013  FINDINGS: Single lead cardiac pacemaker. Cardiac enlargement without vascular congestion. No focal airspace disease or consolidation in the lungs. No blunting of costophrenic angles. No pneumothorax.  IMPRESSION: Cardiac enlargement.  No active cardiopulmonary disease.   Electronically Signed   By: Burman Nieves M.D.   On: 06/26/2013 22:40     Medications:     Scheduled Medications: . aspirin EC  81 mg Oral Daily  . atorvastatin  80 mg Oral q1800   . famotidine  20 mg Oral Daily  . heparin  5,000 Units Subcutaneous 3 times per day  . insulin aspart  0-5 Units Subcutaneous QHS  . insulin aspart  0-9 Units Subcutaneous TID WC  . insulin glargine  15 Units Subcutaneous QHS  . sodium chloride  3 mL Intravenous Q12H    Infusions: . furosemide (LASIX) infusion 15 mg/hr (06/28/13 0559)  . milrinone 0.25 mcg/kg/min (06/28/13 0448)    PRN Medications: sodium chloride, sodium chloride   Assessment:   1) A/C systolic HF - EF 72%, RV mildly dilated and systolic fx moderately reduced 2) CAD - thombotic occlusion of the distal OM1 too small for PCI and was started on plavix 06/2012 3) CKD stage III - baseline Creatinine 2.3-2.5 4) HTN 5) Polysubstance abuse 6) Vtach 7) hypokalemia  Plan/Discussion:    Mr. Scheidecker is well known to the HF team and was last seen in the HF clinic 02/2013. He has end-stage HF and presents today with markedly volume overloaded likely in the setting of non-compliance (was out of medications for about a week).  Remains on lasix gtt 15 mg/hr and UOP sluggish. Patient reports he is having good UOP but not represented in his I/Os. Will start metolazone 5 mg daily and if he remains sluggish will need to be transferred to step-down for central line placement to follow co-oxs and CVPs.   Will continue milrinone 0.25.  Patient is critically ill and is very tenuous. He is having short runs of Vtach. Mag and K+ stable. Will continue to follow. Discussed with patient concerns and prognosis.   Can use Tylenol for pain currently, allergy was listed nausea.    Length of Stay: 2 Aundria Rud NP-C 06/28/2013, 11:22 AM  Advanced Heart Failure Team Pager [phone] (M-F; 7a - 4p)  Please contact [facility] for night-coverage after hours (4p -7a ) and weekends on [email or web address]  Patient seen and examined with Ulla Potash, NP. We discussed all aspects of the encounter. I agree with the assessment and plan as stated  above. Remains markedly volume overloaded. Not responding well to IV lasix and milainone. Will transfer to SDU for central access to follow co-ox and CVPs. Suspect he will need higher dose lasix +/- increase in inotropes. Supp K+. No b-blocker or ACE due to shock and renal failure.   [name] 8:48 PM

## 2013-06-28 NOTE — Progress Notes (Signed)
Pt transferred to 2H 22.  Only pain complaint is right knee.  Warm pack applied to knee per patient request.

## 2013-06-28 NOTE — Progress Notes (Signed)
Minimal UOP. Will transfer to step down for central line placement in order to monitor CVPs and co-oxs.  Ulla Potash B NP-C 5:23 PM

## 2013-06-28 NOTE — Progress Notes (Signed)
Called by RN due to pt requesting pain rx for knee pain.  He got oxycodone 5mg  x 1 earlier today and wants more. Says is allergic to Tylenol and Tramadol.  Will order 1 tablet, requested that RN let pt know this is the only med he will get till am, then primary MD can decide how to eval/treat his knee pain.

## 2013-06-28 NOTE — ED Provider Notes (Addendum)
I saw and evaluated the patient, reviewed the resident's note and I agree with the findings and plan.   EKG Interpretation None     Exam: CHF patient here with SOB. Lungs clear, no JVD.  No respiratory distress. Worsening DOE for past few days. BNP elevated. Admitted.  Dagmar Hait, MD 06/28/13 1624  Dagmar Hait, MD 07/11/13 [phone]

## 2013-06-28 NOTE — Progress Notes (Signed)
Pt had 7 beats of ventricular tachycardia around 0148 and another 13 beats of V tach at 0217, asymptomatic. Denies chest pain, nausea and vomiting, not in respiratory distress. Set of VS taken to chart. Dr. Adolm Joseph was informed and ordered for Magnesium lab this am. Will continue to monitor pt

## 2013-06-28 NOTE — Progress Notes (Signed)
Came to ambulate but pt refused. Sts he isn't able to walk right now due to knee pain with fluid. Sts he will walk tomorrow. Will f/u then. Encouraged walking this pm with staff. He sts he doesn't plan to bother them. Ethelda Chick CES, ACSM 3:31 PM 06/28/2013

## 2013-06-29 DIAGNOSIS — R5383 Other fatigue: Secondary | ICD-10-CM

## 2013-06-29 DIAGNOSIS — N183 Chronic kidney disease, stage 3 unspecified: Secondary | ICD-10-CM

## 2013-06-29 DIAGNOSIS — R570 Cardiogenic shock: Secondary | ICD-10-CM

## 2013-06-29 DIAGNOSIS — R5381 Other malaise: Secondary | ICD-10-CM

## 2013-06-29 DIAGNOSIS — Z515 Encounter for palliative care: Secondary | ICD-10-CM

## 2013-06-29 LAB — GLUCOSE, CAPILLARY
GLUCOSE-CAPILLARY: 195 mg/dL — AB (ref 70–99)
Glucose-Capillary: 227 mg/dL — ABNORMAL HIGH (ref 70–99)
Glucose-Capillary: 151 mg/dL — ABNORMAL HIGH (ref 70–99)
Glucose-Capillary: 153 mg/dL — ABNORMAL HIGH (ref 70–99)
Glucose-Capillary: 185 mg/dL — ABNORMAL HIGH (ref 70–99)

## 2013-06-29 LAB — BASIC METABOLIC PANEL
BUN: 55 mg/dL — AB (ref 6–23)
CO2: 28 mEq/L (ref 19–32)
CREATININE: 2.51 mg/dL — AB (ref 0.50–1.35)
Calcium: 9 mg/dL (ref 8.4–10.5)
Chloride: 94 mEq/L — ABNORMAL LOW (ref 96–112)
GFR, EST AFRICAN AMERICAN: 34 mL/min — AB (ref >90)
GFR, EST NON AFRICAN AMERICAN: 29 mL/min — AB (ref >90)
Glucose, Bld: 159 mg/dL — ABNORMAL HIGH (ref 70–99)
Potassium: 3.7 mEq/L (ref 3.7–5.3)
Sodium: 136 mEq/L — ABNORMAL LOW (ref 137–147)

## 2013-06-29 LAB — CARBOXYHEMOGLOBIN
Carboxyhemoglobin: 1.5 % (ref 0.5–1.5)
METHEMOGLOBIN: 0.6 % (ref 0.0–1.5)
O2 Saturation: 45.9 %
TOTAL HEMOGLOBIN: 14 g/dL (ref 13.5–18.0)

## 2013-06-29 LAB — MRSA PCR SCREENING: MRSA BY PCR: NEGATIVE

## 2013-06-29 MED ORDER — HYDROMORPHONE HCL PF 1 MG/ML IJ SOLN
INTRAMUSCULAR | Status: AC
  Administered 2013-06-29: 1 mg via INTRAVENOUS
  Filled 2013-06-29: qty 1

## 2013-06-29 MED ORDER — ONDANSETRON HCL 4 MG/2ML IJ SOLN
4.0000 mg | Freq: Four times a day (QID) | INTRAMUSCULAR | Status: DC
  Filled 2013-06-29: qty 2

## 2013-06-29 MED ORDER — METOLAZONE 5 MG PO TABS
5.0000 mg | ORAL_TABLET | Freq: Two times a day (BID) | ORAL | Status: DC
  Administered 2013-06-29 – 2013-06-30 (×2): 5 mg via ORAL
  Filled 2013-06-29 (×3): qty 1

## 2013-06-29 MED ORDER — OXYCODONE HCL 5 MG PO TABS
5.0000 mg | ORAL_TABLET | Freq: Three times a day (TID) | ORAL | Status: DC | PRN
  Administered 2013-06-29 – 2013-07-04 (×10): 5 mg via ORAL
  Filled 2013-06-29 (×11): qty 1

## 2013-06-29 MED ORDER — HYDROMORPHONE HCL PF 1 MG/ML IJ SOLN
1.0000 mg | Freq: Once | INTRAMUSCULAR | Status: AC
  Administered 2013-06-29: 1 mg via INTRAVENOUS

## 2013-06-29 MED ORDER — ONDANSETRON HCL 4 MG/2ML IJ SOLN: 4.0000 mg | Freq: Four times a day (QID) | INTRAMUSCULAR | Status: DC | PRN

## 2013-06-29 NOTE — Progress Notes (Signed)
Advanced Heart Failure Rounding Note  PCP: [facility] (Dr. Loistine ChanceIllath) Psychiatrist: Dr. Anthony SarPolowski Subjective:    [name] is a 48 y/o male with HTN, DM2, longstanding HF due to NICM s/p ICD, (probably related to ETOH/drug abuse), CAD (thombotic occlusion of the distal OM1 too small for PCI and was started on plavix 06/2012) , CKD stage III, OSA, drug abuse and noncompliance.   He has not been seen in the HF clinic since 02/2013. Admitted in 03/2013 for CP and cough and was treated for CAP. Reports he has been out of medications however we provided them for him and paid for them last month. Called clinic a couple weeks back and was instructed to take metolazone for a couple of days.   Presented to the ED with weight gain and SOB 6/9.Weight is up about 30 lbs.   Transferred to CCU yesterday d/t sluggish UOP. Central line placed. Initial co-ox 65%. However this am CVP 26 and co-ox 45%. +SOB, DOE and orthopnea. Weight stable. 24 hr I/O -1.7 liters  Objective:   Weight Range:  Vital Signs:   Temp:  [97.7 F (36.5 C)-98.1 F (36.7 C)] 97.7 F (36.5 C) (06/12 0800) Pulse Rate:  [70-107] 107 (06/12 0800) Resp:  [10-32] 12 (06/12 0800) BP: (99-132)/(75-101) 110/94 mmHg (06/12 0800) SpO2:  [98 %-100 %] 98 % (06/12 0800) Weight:  [241 lb 10 oz (109.6 kg)] 241 lb 10 oz (109.6 kg) (06/12 0400) Last BM Date: 06/27/13  Weight change: Filed Weights   06/27/13 0202 06/28/13 0712 06/29/13 0400  Weight: 241 lb (109.317 kg) 241 lb 13.5 oz (109.7 kg) 241 lb 10 oz (109.6 kg)    Intake/Output:   Intake/Output Summary (Last 24 hours) at 06/29/13 1132 Last data filed at 06/29/13 0700  Gross per 24 hour  Intake 1308.02 ml  Output   3130 ml  Net -1821.98 ml     Physical Exam: General:  Chronically ill appearing. Fatigued No resp difficulty HEENT: normal Neck: supple. JVP to ear. Carotids 2+ bilat; no bruits. No lymphadenopathy or thryomegaly appreciated. Cor: PMI normal. Regular rate &  rhythm. 2/6 systolic murmur LLSB. + S3 and RV lift Lungs: clear Abdomen: soft, tender, mildy distended. No hepatosplenomegaly. No bruits or masses. Good bowel sounds. Extremities: no cyanosis, clubbing, rash, 2+ bilateral edema Neuro: alert & orientedx3, cranial nerves grossly intact. moves all 4 extremities w/o difficulty. Affect pleasant  Telemetry: SR 90s  Labs: Basic Metabolic Panel:  Recent Labs Lab 06/26/13 2137 06/27/13 0356 06/28/13 0555 06/29/13 0630  NA 131* 135* 135* 136*  K 3.8 3.6* 3.4* 3.7  CL 85* 92* 91* 94*  CO2 26 29 27 28   GLUCOSE 322* 297* 186* 159*  BUN 58* 59* 56* 55*  CREATININE 2.46* 2.36* 2.48* 2.51*  CALCIUM 9.4 9.2 8.9 9.0  MG  --  2.1 2.0  --     Liver Function Tests:  Recent Labs Lab 06/26/13 2137  AST 23  ALT 9  ALKPHOS 71  BILITOT 1.8*  PROT 7.5  ALBUMIN 3.0*   No results found for this basename: LIPASE, AMYLASE,  in the last 168 hours No results found for this basename: AMMONIA,  in the last 168 hours  CBC:  Recent Labs Lab 06/26/13 2137  WBC 4.8  NEUTROABS 2.8  HGB 15.7  HCT 47.0  MCV 87.4  PLT 190    Cardiac Enzymes:  Recent Labs Lab 06/27/13 0356 06/27/13 0758  TROPONINI <0.30 <0.30    BNP: BNP (last 3  results)  Recent Labs  12/11/12 1021 04/10/13 1850 06/26/13 2137  PROBNP 2957.0* 5986.0* 6208.0*     Other results:  EKG: ST 109 bpm  Imaging: Dg Chest Port 1 View  06/28/2013   CLINICAL DATA:  Central line placement.  CHF.  EXAM: PORTABLE CHEST - 1 VIEW  COMPARISON:  06/26/2013.  FINDINGS: Cardiac enlargement. AICD device good position. Marked worsening aeration with developing pulmonary edema. Right IJ central venous line tip mid SVC, no pneumothorax.  IMPRESSION: Cardiomegaly with pulmonary edema. Right IJ catheter good position, no pneumothorax.   Electronically Signed   By: Davonna Belling M.D.   On: 06/28/2013 21:04     Medications:     Scheduled Medications: . aspirin EC  81 mg Oral Daily   . atorvastatin  80 mg Oral q1800  . famotidine  20 mg Oral Daily  . heparin  5,000 Units Subcutaneous 3 times per day  . insulin aspart  0-5 Units Subcutaneous QHS  . insulin aspart  0-9 Units Subcutaneous TID WC  . insulin glargine  15 Units Subcutaneous QHS  . metolazone  5 mg Oral Daily  . sodium chloride  3 mL Intravenous Q12H    Infusions: . furosemide (LASIX) infusion 20 mg/hr (06/28/13 2255)  . milrinone 0.25 mcg/kg/min (06/29/13 0134)    PRN Medications: sodium chloride, acetaminophen, ondansetron (ZOFRAN) IV, sodium chloride   Assessment:   1) A/C systolic HF - EF 27%, RV mildly dilated and systolic fx moderately reduced 2) CAD - thombotic occlusion of the distal OM1 too small for PCI and was started on plavix 06/2012 3) CKD stage III - baseline Creatinine 2.3-2.5 4) HTN 5) Polysubstance abuse 6) Vtach 7) hypokalemia  Plan/Discussion:    [name] is well known to the HF team and was last seen in the HF clinic 02/2013. He has end-stage HF and presents today with markedly volume overloaded likely in the setting of non-compliance (was out of medications for about a week).  Transferred to CCU for last night for sluggish UOP. Central line placed and CVP 26. Co-ox 45% and still making minimal UOP. Will increase metolazone to 5 mg BID and increase milrinone to 0.375. Patient has had runs of Vtach so have to be careful with milrinone.  Patient is critically ill and is very tenuous. I am not sure if he is going to pull through this admission. Will consult palliative care for goals of care.  Length of Stay: 3 Aundria Rud NP-C 06/29/2013, 11:32 AM  Advanced Heart Failure Team Pager [phone] (M-F; 7a - 4p)  Please contact [facility] for night-coverage after hours (4p -7a ) and weekends on [email or web address]  Patient seen and examined with Ulla Potash, NP. We discussed all aspects of the encounter. I agree with the assessment and plan as stated above.   Starting to  diurese but co-ox remains very low. Agree with increasing milrinone and continuing lasix gtt. Follow co-ox. Can add second inotrope as needed. Agree with Palliative Care consult.  Mirinda Monte,MD 12:10 PM

## 2013-06-29 NOTE — Progress Notes (Signed)
Patient adamantly refusing to walk. Encourage patient to atleast get up to chair with my assistance. Patient stated his body is tired and it is not going to heal unless he rests. Encouraged patient to go for a walk with CCU staff once he feels rested later to day. Will f/u with patient at a later time.

## 2013-06-29 NOTE — Progress Notes (Signed)
Referral received from [facility] for Hospice and Palliative Care of [place] services at discharge.  Patient previously known to [facility]-  discharged from services September 2014 for improved condition, he would be going into the 4th Hospice Medicare benefit period.  Writer spoke with patient at bedside; he is aware, per Medicare regulations, he will require a face to face visit from the [facility] physician to confirm hospice eligibility.    Currently clinical course and plan of care still to be determined, pt remains on IV Milrinone/ IV Lasix,  [facility] Liaison will follow-up on Monday with pt and attending team.  Valente David, RN, MSN, [facility] Liaison [phone]

## 2013-06-29 NOTE — Consult Note (Signed)
Patient [name]      DOB: 12-05-1965      DUK:[id number]     Consult Note from the Palliative Medicine Team at [facility]    Consult Requested by: Dr Gala Romney     PCP: Darden Palmer, MD Reason for Consultation:  Clarification of GOC and options    Phone [phone]  Assessment of patients Current state:  [name] is a 48 y/o male with HTN, DM2, longstanding HF due to NICM s/p ICD, (probably related to ETOH/drug abuse), CAD (thombotic occlusion of the distal OM1 too small for PCI and was started on plavix 06/2012) , CKD stage III, OSA, drug abuse and noncompliance.   Continued slow physical and functional decline over the past year  Consult is for review of medical treatment options, clarification of goals of care and options.   This NP Lorinda Creed reviewed medical records, received report from team, assessed the patient and then meet at the patient's bedside  to discuss diagnosis prognosis, GOC, EOL wishes disposition and options.  A detailed discussion was had today regarding advanced directives.  Concepts specific to code status, artifical feeding and hydration, continued IV antibiotics and rehospitalization was had.  The difference between a aggressive medical intervention path  and a palliative comfort care path for this patient at this time was had.  Values and goals of care important to patient and family were attempted to be elicited.  Concept of Hospice and Palliative Care were discussed.  He has received hospice services in the past    Questions and concerns addressed.   Patient  encouraged to call with questions or concerns.  PMT will continue to support holistically.   Goals of Care: 1.  Code Status:  Full code   2. Scope of Treatment: Patient is open to all available and offered medical interventions to prolong life.  We discussed personal responsibility and choices in healthcare plan.  3. Disposition:   Home with hospice services if eligible,  will write for choice. (he reports continued physical and functional decline)  Spoke with CMRN, Debbie regarding assistance with Medicaid application   4. Symptom Management:   1. Weakness:  Continue medical management of chronic disease   5. Psychosocial:  Emotional support offered.  Patient [facility] lives at [facility], an [employer] housing program.  He has custody of his three young children.  He has support from his church and his sister in [place] is his HPOA.   Brief HPI:  [name] is a 48 y.o. male admitted for shortness of breath. PMH for HTN and DM2 with longstanding HF due to NICM (probably ETOH), EF 10%. Current cocaine abuse. Cath in 2008 showed left circumflex with 40% stenosis.    Was previously on Hospice Care but this ended in December in 2013 as he was doing well.   He states he was without his medications for approximately one week at the end of May. He has since had them refilled and compliant with therapy. He presents with shortness of breath, dyspnea on exertion, increased lower extremity swelling, PND, orthopnea.   Multiple layers of psycho-social issues.     ROS: weakness, fatigue, SOB on exertion   PMH:  Past Medical History  Diagnosis Date  . Hypertension   . CHF (congestive heart failure)     a. 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 b. EF 20-25%, s/p [employer] AICD placement 04/2007. c. Echo 06/2012: EF 10%.  . History of peptic  ulcer disease     EGD 09/2007 shows prox gastric ulcer with black eschar, treated with PPI  . Depression   . History of alcohol abuse     quit approx 2011-2012  . OSA (obstructive sleep apnea)     Intolerant of CPAP  . ICD (implantable cardiac defibrillator) in place   . Diabetes mellitus     insulin dependent  . GERD (gastroesophageal reflux disease)   . Headache(784.0)   . Myasthenia gravis 10/12/2011    Diagnosed in 09/2011. Received Plasmapheresis. CT chest no Thymoma.  Acetylcholine receptor antibody negative.    . CKD (chronic kidney disease) stage 3, GFR 30-59 ml/min   . Polysubstance abuse     Including cocaine (+ UDS multiple times in 2014)  . PUD (peptic ulcer disease)   . Pulmonary nodule     a. CT 09/2011.  Marland Kitchen CAD (coronary artery disease)     a. NSTEMI 06/2012: cath showed subtotal thrombotic occlusion of the distal OM1, too small for PCI, ?cocaine-triggered plaque rupture with thrombosis (no LV thrombus on f/u echo).      PSH: Past Surgical History  Procedure Laterality Date  . Cardiac defibrillator placement  04/2007  . Knee surgery    . Skin grafts    . Facial reconstruction surgery    . Insertion of dialysis catheter  10/12/2011    temporary    I have reviewed the FH and SH and  If appropriate update it with new information. Allergies  Allergen Reactions  . Shellfish Allergy Anaphylaxis  . Tramadol     Causes seizures  . Adhesive [Tape] Hives  . Tylenol [Acetaminophen] Nausea Only  . Zoloft [Sertraline Hcl] Other (See Comments)    Talked to people in his sleep May 2013   Scheduled Meds: . aspirin EC  81 mg Oral Daily  . atorvastatin  80 mg Oral q1800  . famotidine  20 mg Oral Daily  . heparin  5,000 Units Subcutaneous 3 times per day  . insulin aspart  0-5 Units Subcutaneous QHS  . insulin aspart  0-9 Units Subcutaneous TID WC  . insulin glargine  15 Units Subcutaneous QHS  . metolazone  5 mg Oral BID  . sodium chloride  3 mL Intravenous Q12H   Continuous Infusions: . furosemide (LASIX) infusion 20 mg/hr (06/28/13 2255)  . milrinone 0.375 mcg/kg/min (06/29/13 1133)   PRN Meds:.sodium chloride, acetaminophen, ondansetron (ZOFRAN) IV, sodium chloride    BP 123/82  Pulse 101  Temp(Src) 97.7 F (36.5 C) (Oral)  Resp 12  Ht 5\' 11"  (1.803 m)  Wt 109.6 kg (241 lb 10 oz)  BMI 33.71 kg/m2  SpO2 99%   PPS:40 % at best   Intake/Output Summary (Last 24 hours) at 06/29/13 1348 Last data filed at 06/29/13 1200  Gross per  24 hour  Intake 1308.02 ml  Output   4730 ml  Net -3421.98 ml    Physical Exam:  General: chronically ill appearing, sitting on edge of bed HEENT:  Dry buccal membranes, no exudate Chest:   CTA CVS: RRR noted murmur  Abdomen: mild distention Ext: BLE +2 edema Neuro: alert and oriented X3  Labs: CBC    Component Value Date/Time   WBC 4.8 06/26/2013 2137   RBC 5.38 06/26/2013 2137   HGB 15.7 06/26/2013 2137   HCT 47.0 06/26/2013 2137   PLT 190 06/26/2013 2137   MCV 87.4 06/26/2013 2137   MCH 29.2 06/26/2013 2137   MCHC 33.4 06/26/2013 2137  RDW 15.5 06/26/2013 2137   LYMPHSABS 1.1 06/26/2013 2137   MONOABS 0.3 06/26/2013 2137   EOSABS 0.5 06/26/2013 2137   BASOSABS 0.0 06/26/2013 2137    BMET    Component Value Date/Time   NA 136* 06/29/2013 0630   K 3.7 06/29/2013 0630   CL 94* 06/29/2013 0630   CO2 28 06/29/2013 0630   GLUCOSE 159* 06/29/2013 0630   BUN 55* 06/29/2013 0630   CREATININE 2.51* 06/29/2013 0630   CREATININE 1.89* 11/02/2012 1120   CALCIUM 9.0 06/29/2013 0630   GFRNONAA 29* 06/29/2013 0630   GFRNONAA 42* 11/02/2012 1120   GFRAA 34* 06/29/2013 0630   GFRAA 48* 11/02/2012 1120    CMP     Component Value Date/Time   NA 136* 06/29/2013 0630   K 3.7 06/29/2013 0630   CL 94* 06/29/2013 0630   CO2 28 06/29/2013 0630   GLUCOSE 159* 06/29/2013 0630   BUN 55* 06/29/2013 0630   CREATININE 2.51* 06/29/2013 0630   CREATININE 1.89* 11/02/2012 1120   CALCIUM 9.0 06/29/2013 0630   PROT 7.5 06/26/2013 2137   ALBUMIN 3.0* 06/26/2013 2137   AST 23 06/26/2013 2137   ALT 9 06/26/2013 2137   ALKPHOS 71 06/26/2013 2137   BILITOT 1.8* 06/26/2013 2137   GFRNONAA 29* 06/29/2013 0630   GFRNONAA 42* 11/02/2012 1120   GFRAA 34* 06/29/2013 0630   GFRAA 48* 11/02/2012 1120     Time In Time Out Total Time Spent with Patient Total Overall Time  1300 1415 70 min 75 min    Greater than 50%  of this time was spent counseling and coordinating care related to the above assessment and plan.   Lorinda CreedMary Larach NP   Palliative Medicine Team Team Phone # [phone] Pager [phone]  Discussed with CMRN

## 2013-06-29 NOTE — Care Management Note (Addendum)
    Page 1 of 2   07/05/2013     11:26:53 AM CARE MANAGEMENT NOTE 07/05/2013  Patient:  [name], [name]   Account Number:  [id number]  Date Initiated:  06/29/2013  Documentation initiated by:  Junius Creamer  Subjective/Objective Assessment:   adm w heart failure     Action/Plan:   lives w fam, pcp dr Lindalou Hose [place] Organ   Anticipated DC Date:  07/05/2013   Anticipated DC Plan:  HOME W [facility] CARE  In-house referral  Financial Counselor  Clinical Social Worker      DC Associate Professor  CM consult      PAC Choice  HOSPICE   Choice offered to / List presented to:  C-1 Patient        HH arranged  HH-1 RN  HH-6 SOCIAL WORKER      HH agency  [facility] [place]   Status of service:  Completed, signed off Medicare Important Message given?  YES (If response is "NO", the following Medicare IM given date fields will be blank) Date Medicare IM given:  06/29/2013 Date Additional Medicare IM given:  07/05/2013  Discharge Disposition:  HOME W HOSPICE CARE  Per UR Regulation:  Reviewed for med. necessity/level of care/duration of stay  If discussed at Long Length of Stay Meetings, dates discussed:   07/03/2013  07/05/2013    Comments:  07/05/13 1000 Oletta Cohn, RN, BSN, NCM [phone] Pt will discharge home with Hospice and Palliative Care today.  Pt in need of ride home per pt and Valente David, RN.  NCM consulted LCSW.  6/12 1405 debbie dowell rn,bsn mary larosh w paliative care saw pt. whe is rec home hospice again. spoke w pt and he would like hospice and [facility] of [place]. ref to [employer] [facility] [place]. also left vm for [employer] to see if pt would qualify for medicaid. pt lives [facility] home that [facility]. [facility] case worker Ambrose Pancoast [phone], cell [phone]. has three children from age 63 to age 76 that live w him.

## 2013-06-30 DIAGNOSIS — E876 Hypokalemia: Secondary | ICD-10-CM

## 2013-06-30 DIAGNOSIS — R000 Tachycardia, unspecified: Secondary | ICD-10-CM

## 2013-06-30 LAB — GLUCOSE, CAPILLARY
GLUCOSE-CAPILLARY: 275 mg/dL — AB (ref 70–99)
Glucose-Capillary: 117 mg/dL — ABNORMAL HIGH (ref 70–99)
Glucose-Capillary: 240 mg/dL — ABNORMAL HIGH (ref 70–99)
Glucose-Capillary: 225 mg/dL — ABNORMAL HIGH (ref 70–99)

## 2013-06-30 LAB — CARBOXYHEMOGLOBIN
Carboxyhemoglobin: 1.9 % — ABNORMAL HIGH (ref 0.5–1.5)
METHEMOGLOBIN: 0.7 % (ref 0.0–1.5)
O2 Saturation: 75.5 %
TOTAL HEMOGLOBIN: 14.3 g/dL (ref 13.5–18.0)

## 2013-06-30 LAB — BASIC METABOLIC PANEL
BUN: 56 mg/dL — ABNORMAL HIGH (ref 6–23)
CHLORIDE: 88 meq/L — AB (ref 96–112)
CO2: 31 mEq/L (ref 19–32)
Calcium: 9.8 mg/dL (ref 8.4–10.5)
Creatinine, Ser: 2.74 mg/dL — ABNORMAL HIGH (ref 0.50–1.35)
GFR calc Af Amer: 31 mL/min — ABNORMAL LOW (ref >90)
GFR calc non Af Amer: 26 mL/min — ABNORMAL LOW (ref >90)
GLUCOSE: 131 mg/dL — AB (ref 70–99)
POTASSIUM: 3.3 meq/L — AB (ref 3.7–5.3)
SODIUM: 133 meq/L — AB (ref 137–147)

## 2013-06-30 LAB — URIC ACID: URIC ACID, SERUM: 16.7 mg/dL — AB (ref 4.0–7.8)

## 2013-06-30 MED ORDER — PREDNISONE 20 MG PO TABS
40.0000 mg | ORAL_TABLET | Freq: Every day | ORAL | Status: AC
  Administered 2013-06-30 – 2013-07-02 (×3): 40 mg via ORAL
  Filled 2013-06-30 (×3): qty 2

## 2013-06-30 MED ORDER — POTASSIUM CHLORIDE CRYS ER 20 MEQ PO TBCR
30.0000 meq | EXTENDED_RELEASE_TABLET | Freq: Two times a day (BID) | ORAL | Status: AC
  Administered 2013-06-30 – 2013-07-01 (×3): 30 meq via ORAL
  Filled 2013-06-30 (×2): qty 1

## 2013-06-30 NOTE — Progress Notes (Signed)
Asked patient to wear CPAP x2, once at 2300 and another at 0020 and patient said he would call when ready to wear. No call throughout the night at this point. Will continue to assist when needed.

## 2013-06-30 NOTE — Progress Notes (Signed)
1220 Cardiac Rehab Attempted to get pt to ambulate. He c/o of pain in both ankles and left elbow. Pt states that he was on bedrest and "I am not suppose to walk." He refused due to pain. We will continue to follow.

## 2013-06-30 NOTE — Progress Notes (Signed)
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 Polysubstance abuse incl EToH/cocaine-recurrent assoc with NSTEMI) Also DM /ICD    [facility] 07/17/12 showing subtotal thrombotic occlusion of the distal OM1. It was too small at this point for PCI and was started on plavix. Echo showed EF 10% with no LV thrombus and moderately decreased RV systolic function  Admitted with SOB and because of decreased urine output>milrinone and metalozone lasix drip Palliateve care consulted  Previous with Hospice  That is his goal   Sob better still edematous Very tired Pain left elbow Past Medical History  Diagnosis Date  . Hypertension   . CHF (congestive heart failure)     a. 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 b. EF 20-25%, s/p St. Jude AICD placement 04/2007. c. Echo 06/2012: EF 10%.  . History of peptic ulcer disease     EGD 09/2007 shows prox gastric ulcer with black eschar, treated with PPI  . Depression   . History of alcohol abuse     quit approx 2011-2012  . OSA (obstructive sleep apnea)     Intolerant of CPAP  . ICD (implantable cardiac defibrillator) in place   . Diabetes mellitus     insulin dependent  . GERD (gastroesophageal reflux disease)   . Headache(784.0)   . Myasthenia gravis 10/12/2011    Diagnosed in 09/2011. Received Plasmapheresis. CT chest no Thymoma. Acetylcholine receptor antibody negative.    . CKD (chronic kidney disease) stage 3, GFR 30-59 ml/min   . Polysubstance abuse     Including cocaine (+ UDS multiple times in 2014)  . PUD (peptic ulcer disease)   . Pulmonary nodule     a. CT 09/2011.  Marland Kitchen CAD (coronary artery disease)     a. NSTEMI 06/2012: cath showed subtotal thrombotic occlusion of the distal OM1, too small for PCI, ?cocaine-triggered plaque rupture with thrombosis (no LV thrombus on f/u echo).     Scheduled Meds:  Scheduled Meds: . aspirin EC  81 mg Oral Daily  . atorvastatin  80 mg Oral  q1800  . famotidine  20 mg Oral Daily  . heparin  5,000 Units Subcutaneous 3 times per day  . insulin aspart  0-5 Units Subcutaneous QHS  . insulin aspart  0-9 Units Subcutaneous TID WC  . insulin glargine  15 Units Subcutaneous QHS  . metolazone  5 mg Oral BID  . sodium chloride  3 mL Intravenous Q12H   Continuous Infusions: . furosemide (LASIX) infusion 20 mg/hr (06/30/13 0500)  . milrinone 0.375 mcg/kg/min (06/30/13 0923)    PHYSICAL EXAM Filed Vitals:   06/30/13 0000 06/30/13 0434 06/30/13 0552 06/30/13 0809  BP:  100/76  150/136  Pulse:  106  105  Temp:  99.4 F (37.4 C)  98.7 F (37.1 C)  TempSrc:  Oral  Oral  Resp: 30 10 15 20   Height:      Weight:  231 lb 11.3 oz (105.1 kg)    SpO2:  100%  100%    Well developed and nourished in no acute distress but tired HENT normal Neck supple   Clear JVP high Rapid Regular rate and rhythm, no murmurs or gallops Abd-soft with active BS No Clubbing cyanosis  2+ edema  Swelling of left elbow Skin-warm and dry A & Oriented  Grossly normal sensory and motor function   TELEMETRY: Reviewed telemetry pt in  Tachycardia -supraventricular:  Intake/Output Summary (Last 24 hours) at 06/30/13 1148 Last data filed at 06/30/13 0800  Gross per 24 hour  Intake 1436.05 ml  Output   6800 ml  Net -5363.95 ml    LABS: Basic Metabolic Panel:  Recent Labs Lab 06/26/13 2137 06/27/13 0356 06/28/13 0555 06/29/13 0630 06/30/13 0543  NA 131* 135* 135* 136* 133*  K 3.8 3.6* 3.4* 3.7 3.3*  CL 85* 92* 91* 94* 88*  CO2 26 29 27 28 31   GLUCOSE 322* 297* 186* 159* 131*  BUN 58* 59* 56* 55* 56*  CREATININE 2.46* 2.36* 2.48* 2.51* 2.74*  CALCIUM 9.4 9.2 8.9 9.0 9.8  MG  --  2.1 2.0  --   --    Cardiac Enzymes: No results found for this basename: CKTOTAL, CKMB, CKMBINDEX, TROPONINI,  in the last 72 hours CBC:  Recent Labs Lab 06/26/13 2137  WBC 4.8  NEUTROABS 2.8  HGB 15.7  HCT 47.0  MCV 87.4  PLT 190   PROTIME: No  results found for this basename: LABPROT, INR,  in the last 72 hours Liver Function Tests: No results found for this basename: AST, ALT, ALKPHOS, BILITOT, PROT, ALBUMIN,  in the last 72 hours No results found for this basename: LIPASE, AMYLASE,  in the last 72 hours BNP: BNP (last 3 results)  Recent Labs  12/11/12 1021 04/10/13 1850 06/26/13 2137  PROBNP 2957.0* 5986.0* 6208.0*   D-Dimer: No results found for this basename: DDIMER,  in the last 72 hours Hemoglobin A1C: No results found for this basename: HGBA1C,  in the last 72 hours Fasting Lipid Panel:   ASSESSMENT AND PLAN:  Active Problems:   Chronic renal failure   Acute gout   Acute on chronic systolic heart failure   Cardiogenic shock   Tachycardia-sinus v atrial   Hypokalemia  He is diuresing well and his renal function is staying stable wil lcontinue current regime I suspect his elbow pain and foot pain may be gout,  Will check uric acid and maybe add colchicine;  His GFR is barely above 30  I would prefer not to use steroids He has I wonder whether his tachycardia is primary or secondary  There are subtle p wave abnormalities, but striking BAE whichmakes interpretation difficult.  He may be a candidate for ivabradine but we need to get him oh his betablockers first and see Replete K gently      Signed, Sherryl MangesSteven Klein MD  06/30/2013

## 2013-06-30 NOTE — Consult Note (Signed)
I have reviewed this case with our NP and agree with the Assessment and Plan as stated.  Tessia Kassin L. Mauricio Dahlen, MD MBA The Palliative Medicine Team at [place] Team Phone: [phone] Pager: [phone]   

## 2013-07-01 LAB — BASIC METABOLIC PANEL
BUN: 57 mg/dL — ABNORMAL HIGH (ref 6–23)
CHLORIDE: 84 meq/L — AB (ref 96–112)
CO2: 31 mEq/L (ref 19–32)
Calcium: 9.9 mg/dL (ref 8.4–10.5)
Creatinine, Ser: 2.61 mg/dL — ABNORMAL HIGH (ref 0.50–1.35)
GFR calc Af Amer: 32 mL/min — ABNORMAL LOW (ref >90)
GFR, EST NON AFRICAN AMERICAN: 28 mL/min — AB (ref >90)
GLUCOSE: 387 mg/dL — AB (ref 70–99)
POTASSIUM: 3.9 meq/L (ref 3.7–5.3)
SODIUM: 129 meq/L — AB (ref 137–147)

## 2013-07-01 LAB — CARBOXYHEMOGLOBIN
Carboxyhemoglobin: 1.8 % — ABNORMAL HIGH (ref 0.5–1.5)
Methemoglobin: 0.6 % (ref 0.0–1.5)
O2 SAT: 70.6 %
Total hemoglobin: 15.5 g/dL (ref 13.5–18.0)

## 2013-07-01 LAB — GLUCOSE, CAPILLARY: GLUCOSE-CAPILLARY: 370 mg/dL — AB (ref 70–99)

## 2013-07-01 MED ORDER — SUMATRIPTAN SUCCINATE 50 MG PO TABS
50.0000 mg | ORAL_TABLET | ORAL | Status: DC | PRN
  Administered 2013-07-01 – 2013-07-05 (×6): 50 mg via ORAL
  Filled 2013-07-01 (×6): qty 1

## 2013-07-01 MED ORDER — COLCHICINE 0.6 MG PO TABS
0.6000 mg | ORAL_TABLET | Freq: Two times a day (BID) | ORAL | Status: AC
  Administered 2013-07-01 (×2): 0.6 mg via ORAL
  Filled 2013-07-01 (×2): qty 1

## 2013-07-01 MED ORDER — COLCHICINE 0.6 MG PO TABS
0.3000 mg | ORAL_TABLET | Freq: Every day | ORAL | Status: DC
  Filled 2013-07-01: qty 0.5

## 2013-07-01 NOTE — Progress Notes (Signed)
When RN entered room, pt yelled,"Why do yall do this? Why do you want to interrupt my breakfast to check my blood sugar?" RN explained pt's wishes of not being woken up for tasks was passed along and was trying to group tasks together. Pt refused CBG.  Tammy Sours

## 2013-07-01 NOTE — Progress Notes (Signed)
Patient placed on CPAP for HS.  FFM used per patient's home regimen.  Auto-titration (min 7, max 20) mode used as patient home settings are unknown.  Patient is familiar with equipment and procedure.  Room air used.

## 2013-07-01 NOTE — Progress Notes (Addendum)
Patient Name: [name]      SUBJECTIVE: 48 yo with Class IV heart failure 2/2 NICM (? Polysubstance abuse incl EToH/cocaine-recurrent assoc with NSTEMI) Also DM and with  ICD in place   [place] 07/17/12 showing subtotal thrombotic occlusion of the distal OM1. It was too small at this point for PCI and was started on plavix. Echo showed EF 10% with no LV thrombus and moderately decreased RV systolic function  Admitted with SOB and because of decreased urine output>milrinone and metalozone lasix drip Palliateve care consulted  Previous with Hospice  That is his goal   breatthing better Still with pain in elbow and feet Also complaining of headaches-migraines and similar to what he has had for a long time    Past Medical History  Diagnosis Date  . Hypertension   . CHF (congestive heart failure)     a. 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 b. EF 20-25%, s/p St. Jude AICD placement 04/2007. c. Echo 06/2012: EF 10%.  . History of peptic ulcer disease     EGD 09/2007 shows prox gastric ulcer with black eschar, treated with PPI  . Depression   . History of alcohol abuse     quit approx 2011-2012  . OSA (obstructive sleep apnea)     Intolerant of CPAP  . ICD (implantable cardiac defibrillator) in place   . Diabetes mellitus     insulin dependent  . GERD (gastroesophageal reflux disease)   . Headache(784.0)   . Myasthenia gravis 10/12/2011    Diagnosed in 09/2011. Received Plasmapheresis. CT chest no Thymoma. Acetylcholine receptor antibody negative.    . CKD (chronic kidney disease) stage 3, GFR 30-59 ml/min   . Polysubstance abuse     Including cocaine (+ UDS multiple times in 2014)  . PUD (peptic ulcer disease)   . Pulmonary nodule     a. CT 09/2011.  Marland Kitchen CAD (coronary artery disease)     a. NSTEMI 06/2012: cath showed subtotal thrombotic occlusion of the distal OM1, too small for PCI, ?cocaine-triggered plaque rupture with thrombosis (no LV thrombus on  f/u echo).     Scheduled Meds:  Scheduled Meds: . aspirin EC  81 mg Oral Daily  . atorvastatin  80 mg Oral q1800  . famotidine  20 mg Oral Daily  . heparin  5,000 Units Subcutaneous 3 times per day  . insulin aspart  0-5 Units Subcutaneous QHS  . insulin aspart  0-9 Units Subcutaneous TID WC  . insulin glargine  15 Units Subcutaneous QHS  . potassium chloride  30 mEq Oral BID  . predniSONE  40 mg Oral Q breakfast  . sodium chloride  3 mL Intravenous Q12H   Continuous Infusions: sodium chloride, acetaminophen, ondansetron (ZOFRAN) IV, oxyCODONE, sodium chloride   PHYSICAL EXAM Filed Vitals:   06/30/13 2343 07/01/13 0230 07/01/13 0329 07/01/13 0451  BP: 110/80  129/78   Pulse:      Temp:   98.5 F (36.9 C)   TempSrc:   Oral   Resp: 12 18 19    Height:      Weight:    220 lb 3.8 oz (99.9 kg)  SpO2: 100% 100% 100%     Well developed and nourished in no acute distress but tired HENT normal Neck supple   Clear JVP high Rapid Regular rate and rhythm, no murmurs or gallops Abd-soft with active BS No Clubbing cyanosis  2+ edema  Swelling of left elbow  Skin-warm and dry A & Oriented  Grossly normal sensory and motor function   TELEMETRY: Reviewed telemetry pt in  Tachycardia -supraventricular:    Intake/Output Summary (Last 24 hours) at 07/01/13 1100 Last data filed at 07/01/13 1000  Gross per 24 hour  Intake 1789.2 ml  Output   4525 ml  Net -2735.8 ml    LABS: Basic Metabolic Panel:  Recent Labs Lab 06/26/13 2137 06/27/13 0356 06/28/13 0555 06/29/13 0630 06/30/13 0543 07/01/13 0330  NA 131* 135* 135* 136* 133* 129*  K 3.8 3.6* 3.4* 3.7 3.3* 3.9  CL 85* 92* 91* 94* 88* 84*  CO2 26 29 27 28 31 31   GLUCOSE 322* 297* 186* 159* 131* 387*  BUN 58* 59* 56* 55* 56* 57*  CREATININE 2.46* 2.36* 2.48* 2.51* 2.74* 2.61*  CALCIUM 9.4 9.2 8.9 9.0 9.8 9.9  MG  --  2.1 2.0  --   --   --    Cardiac Enzymes: No results found for this basename: CKTOTAL, CKMB,  CKMBINDEX, TROPONINI,  in the last 72 hours CBC:  Recent Labs Lab 06/26/13 2137  WBC 4.8  NEUTROABS 2.8  HGB 15.7  HCT 47.0  MCV 87.4  PLT 190   PROTIME: No results found for this basename: LABPROT, INR,  in the last 72 hours Liver Function Tests: No results found for this basename: AST, ALT, ALKPHOS, BILITOT, PROT, ALBUMIN,  in the last 72 hours No results found for this basename: LIPASE, AMYLASE,  in the last 72 hours BNP: BNP (last 3 results)  Recent Labs  12/11/12 1021 04/10/13 1850 06/26/13 2137  PROBNP 2957.0* 5986.0* 6208.0*   D-Dimer: No results found for this basename: DDIMER,  in the last 72 hours Hemoglobin A1C: No results found for this basename: HGBA1C,  in the last 72 hours Fasting Lipid Panel: URIC acid 16.7   ASSESSMENT AND PLAN:  Active Problems:   Chronic renal failure   Acute gout   Acute on chronic systolic heart failure   Cardiogenic shock   Tachycardia-sinus v atrial  He is diuresing well and his renal function is staying stable Will continue current regime I suspect his elbow pain and foot pain may be gout,  Uric acid elevated will  add colchicine;  His GFR is barely above 30  He does have long historyof left elbow issues   I wonder whether his tachycardia is primary or secondary  There are subtle p wave abnormalities, but striking BAE whichmakes interpretation difficult.  He may be a candidate for ivabradine but we need to get him oh his betablockers first and see    will try imitrex for migraine headaches   Signed, Sherryl MangesSteven Klein MD  07/01/2013

## 2013-07-01 NOTE — Progress Notes (Signed)
Pt refuses heparin sq and scd's  at this time.  Agrees to wear scds later in the night.  Educated pt.  Will continue to monitor. [name]

## 2013-07-02 DIAGNOSIS — N189 Chronic kidney disease, unspecified: Secondary | ICD-10-CM

## 2013-07-02 DIAGNOSIS — M109 Gout, unspecified: Secondary | ICD-10-CM

## 2013-07-02 LAB — BASIC METABOLIC PANEL
BUN: 65 mg/dL — AB (ref 6–23)
CALCIUM: 10.1 mg/dL (ref 8.4–10.5)
CO2: 32 meq/L (ref 19–32)
CREATININE: 2.44 mg/dL — AB (ref 0.50–1.35)
Chloride: 81 mEq/L — ABNORMAL LOW (ref 96–112)
GFR calc Af Amer: 35 mL/min — ABNORMAL LOW (ref >90)
GFR calc non Af Amer: 30 mL/min — ABNORMAL LOW (ref >90)
Glucose, Bld: 335 mg/dL — ABNORMAL HIGH (ref 70–99)
Potassium: 3.9 mEq/L (ref 3.7–5.3)
Sodium: 128 mEq/L — ABNORMAL LOW (ref 137–147)

## 2013-07-02 LAB — CARBOXYHEMOGLOBIN
CARBOXYHEMOGLOBIN: 2.2 % — AB (ref 0.5–1.5)
Methemoglobin: 0.7 % (ref 0.0–1.5)
O2 Saturation: 85.9 %
TOTAL HEMOGLOBIN: 13.1 g/dL — AB (ref 13.5–18.0)

## 2013-07-02 LAB — GLUCOSE, CAPILLARY
Glucose-Capillary: 280 mg/dL — ABNORMAL HIGH (ref 70–99)
Glucose-Capillary: 398 mg/dL — ABNORMAL HIGH (ref 70–99)
Glucose-Capillary: 295 mg/dL — ABNORMAL HIGH (ref 70–99)
Glucose-Capillary: 330 mg/dL — ABNORMAL HIGH (ref 70–99)
Glucose-Capillary: 377 mg/dL — ABNORMAL HIGH (ref 70–99)

## 2013-07-02 MED ORDER — MILRINONE IN DEXTROSE 20 MG/100ML IV SOLN
0.1250 ug/kg/min | INTRAVENOUS | Status: DC
  Administered 2013-07-02: 0.25 ug/kg/min via INTRAVENOUS
  Administered 2013-07-03: 0.125 ug/kg/min via INTRAVENOUS
  Filled 2013-07-02 (×2): qty 100

## 2013-07-02 MED ORDER — ALLOPURINOL 100 MG PO TABS
100.0000 mg | ORAL_TABLET | Freq: Every day | ORAL | Status: DC
  Administered 2013-07-02 – 2013-07-05 (×4): 100 mg via ORAL
  Filled 2013-07-02 (×4): qty 1

## 2013-07-02 MED ORDER — ISOSORBIDE MONONITRATE ER 30 MG PO TB24
30.0000 mg | ORAL_TABLET | Freq: Every day | ORAL | Status: DC
  Administered 2013-07-02 – 2013-07-05 (×4): 30 mg via ORAL
  Filled 2013-07-02 (×4): qty 1

## 2013-07-02 MED ORDER — TORSEMIDE 20 MG PO TABS
60.0000 mg | ORAL_TABLET | Freq: Two times a day (BID) | ORAL | Status: DC
  Filled 2013-07-02 (×3): qty 3

## 2013-07-02 MED ORDER — HYDRALAZINE HCL 25 MG PO TABS
25.0000 mg | ORAL_TABLET | Freq: Three times a day (TID) | ORAL | Status: DC
  Administered 2013-07-02 – 2013-07-05 (×9): 25 mg via ORAL
  Filled 2013-07-02 (×12): qty 1

## 2013-07-02 MED ORDER — TORSEMIDE 20 MG PO TABS
60.0000 mg | ORAL_TABLET | Freq: Two times a day (BID) | ORAL | Status: DC
  Administered 2013-07-02 – 2013-07-05 (×7): 60 mg via ORAL
  Filled 2013-07-02 (×9): qty 3

## 2013-07-02 NOTE — Progress Notes (Signed)
Inpatient Diabetes Program Recommendations  AACE/ADA: New Consensus Statement on Inpatient Glycemic Control (2013)  Target Ranges:  Prepandial:   less than 140 mg/dL      Peak postprandial:   less than 180 mg/dL (1-2 hours)      Critically ill patients:  140 - 180 mg/dL  Results for [name], [name] (MRN [id number]) as of 07/02/2013 12:37  Ref. Range 07/01/2013 12:30 07/01/2013 17:48 07/01/2013 22:30 07/02/2013 08:08 07/02/2013 11:35  Glucose-Capillary Latest Range: 70-99 mg/dL 938 (H) 101 (H) 751 (H) 295 (H) 330 (H)   Inpatient Diabetes Program Recommendations Insulin - Basal: Increase Lantus to 20 units  Correction (SSI): Increase to resistant scale during steroid therapy  Thank you  Piedad Climes BSN, RN,CDE Inpatient Diabetes Coordinator [phone] (team pager)

## 2013-07-02 NOTE — Progress Notes (Signed)
Spoke with patient regarding CPAP. Patient states he will call when ready.

## 2013-07-02 NOTE — Progress Notes (Signed)
[facility]  Patient Status: Active (receiving services up to time of hospitalization)  [facility] is providing the following services: RN, MSW and HHA  If patient discharges after hours, please call [phone].   [name] 07/02/2013, 5:27 PM

## 2013-07-02 NOTE — Progress Notes (Signed)
Clinical Social Work Department BRIEF PSYCHOSOCIAL ASSESSMENT 07/02/2013  Patient:  [name], [name]     Account Number:  [id number]     Admit date:  06/26/2013  Clinical Social Worker:  Varney Biles  Date/Time:  07/02/2013 03:53 PM  Referred by:  Physician  Date Referred:  07/02/2013 Referred for  Substance Abuse   Other Referral:   Interview type:  Patient Other interview type:    PSYCHOSOCIAL DATA Living Status:  FAMILY Admitted from facility:   Level of care:   Primary support name:  Renda Rolls [phone]) Primary support relationship to patient:  SIBLING Degree of support available:   Good--pt lives in supportive housing through [employer] and has a Sports coach; receives support from his pastor, who is currently watching his 3 children while pt is hospitalized.    CURRENT CONCERNS Current Concerns  Substance Abuse   Other Concerns:    SOCIAL WORK ASSESSMENT / PLAN Pt expressed frustrations with missing his 3 children (ages 24-12). Pt had inquired about Medicaid application, and spoken with the financial counselor and is aware he needs to go to DSS to complete this. Pt states his application is pending and was completed a few weeks ago, so this is no longer a concern. CSW attempted to engage pt in conversation about substance use; pt states he does not drink, smoke, or use drugs. Pt's pastor is caring for his children while he is in the hospital.   Assessment/plan status:  No Further Intervention Required Other assessment/ plan:   Information/referral to community resources:   N/A    PATIENT'S/FAMILY'S RESPONSE TO PLAN OF CARE: Good--pt engaged in conversation with CSW, understanding of CSW role. Pt states he has no needs for hospital CSW and has a case manager in the community who helps him. CSW signing off.       Maryclare Labrador, MSW, [facility] Clinical Social Worker [phone]

## 2013-07-02 NOTE — Progress Notes (Signed)
Patient calls when ready for CPAP, no Call as of yet. Will continue monitoring.

## 2013-07-02 NOTE — Progress Notes (Signed)
Advanced Heart Failure Rounding Note  [facility] (Dr. Loistine Chance) Psychiatrist: Dr. Anthony Sar Subjective:    [name] is a 48 y/o male with HTN, DM2, longstanding HF due to NICM s/p ICD, (probably related to ETOH/drug abuse), CAD (thombotic occlusion of the distal OM1 too small for PCI and was started on plavix 06/2012) , CKD stage III, OSA, drug abuse and noncompliance.   He has not been seen in the HF clinic since 02/2013. Admitted in 03/2013 for CP and cough and was treated for CAP. Reports he has been out of medications however we provided them for him and paid for them last month. Called clinic a couple weeks back and was instructed to take metolazone for a couple of days.   Presented to the ED with weight gain and SOB 6/9.Weight is up about 30 lbs. He has been diuresing a lasix drip and milrinone at 0.375 mcg. Overall weight is down  29  pounds.   CO-OX 85%  Creatinine 2.6>2.4  CVP 5 Objective:   Weight Range:  Vital Signs:   Temp:  [97.7 F (36.5 C)-97.9 F (36.6 C)] 97.8 F (36.6 C) (06/15 0500) Pulse Rate:  [97] 97 (06/14 1228) Resp:  [12-28] 21 (06/15 0510) BP: (108-131)/(68-93) 117/93 mmHg (06/15 0510) SpO2:  [94 %-100 %] 95 % (06/15 0510) Weight:  [225 lb 5 oz (102.2 kg)] 225 lb 5 oz (102.2 kg) (06/15 0517) Last BM Date: 06/28/13  Weight change: Filed Weights   06/30/13 0434 07/01/13 0451 07/02/13 0517  Weight: 231 lb 11.3 oz (105.1 kg) 220 lb 3.8 oz (99.9 kg) 225 lb 5 oz (102.2 kg)    Intake/Output:   Intake/Output Summary (Last 24 hours) at 07/02/13 0747 Last data filed at 07/02/13 0500  Gross per 24 hour  Intake 1846.9 ml  Output   4050 ml  Net -2203.1 ml     Physical Exam: CVP 5 General:  Chronically ill appearing. Fatigued No resp difficulty. Lying in bed.  HEENT: normal Neck: supple. JVP 5-6 . Carotids 2+ bilat; no bruits. No lymphadenopathy or thryomegaly appreciated. Cor: PMI normal. Regular rate & rhythm. 2/6 systolic murmur LLSB. +  S3 and RV lift Lungs: clear Abdomen: soft, tender, mildy distended. No hepatosplenomegaly. No bruits or masses. Good bowel sounds. Extremities: no cyanosis, clubbing, rash,  edema Neuro: alert & orientedx3, cranial nerves grossly intact. moves all 4 extremities w/o difficulty. Affect pleasant  Telemetry: ST 100s with occasional PVC  Labs: Basic Metabolic Panel:  Recent Labs Lab 06/27/13 0356 06/28/13 0555 06/29/13 0630 06/30/13 0543 07/01/13 0330 07/02/13 0458  NA 135* 135* 136* 133* 129* 128*  K 3.6* 3.4* 3.7 3.3* 3.9 3.9  CL 92* 91* 94* 88* 84* 81*  CO2 29 27 28 31 31  32  GLUCOSE 297* 186* 159* 131* 387* 335*  BUN 59* 56* 55* 56* 57* 65*  CREATININE 2.36* 2.48* 2.51* 2.74* 2.61* 2.44*  CALCIUM 9.2 8.9 9.0 9.8 9.9 10.1  MG 2.1 2.0  --   --   --   --     Liver Function Tests:  Recent Labs Lab 06/26/13 2137  AST 23  ALT 9  ALKPHOS 71  BILITOT 1.8*  PROT 7.5  ALBUMIN 3.0*   No results found for this basename: LIPASE, AMYLASE,  in the last 168 hours No results found for this basename: AMMONIA,  in the last 168 hours  CBC:  Recent Labs Lab 06/26/13 2137  WBC 4.8  NEUTROABS 2.8  HGB 15.7  HCT  47.0  MCV 87.4  PLT 190    Cardiac Enzymes:  Recent Labs Lab 06/27/13 0356 06/27/13 0758  TROPONINI <0.30 <0.30    BNP: BNP (last 3 results)  Recent Labs  12/11/12 1021 04/10/13 1850 06/26/13 2137  PROBNP 2957.0* 5986.0* 6208.0*     Other results:  EKG: ST 109 bpm  Imaging: No results found.   Medications:     Scheduled Medications: . aspirin EC  81 mg Oral Daily  . atorvastatin  80 mg Oral q1800  . colchicine  0.3 mg Oral Daily  . famotidine  20 mg Oral Daily  . heparin  5,000 Units Subcutaneous 3 times per day  . insulin aspart  0-5 Units Subcutaneous QHS  . insulin aspart  0-9 Units Subcutaneous TID WC  . insulin glargine  15 Units Subcutaneous QHS  . predniSONE  40 mg Oral Q breakfast  . sodium chloride  3 mL Intravenous Q12H     Infusions: . furosemide (LASIX) infusion 20 mg/hr (07/01/13 2102)  . milrinone 0.375 mcg/kg/min (07/02/13 0700)    PRN Medications: sodium chloride, acetaminophen, ondansetron (ZOFRAN) IV, oxyCODONE, sodium chloride, SUMAtriptan   Assessment:   1) A/C systolic HF - EF 16%10%, RV mildly dilated and systolic fx moderately reduced 2) CAD - thombotic occlusion of the distal OM1 too small for PCI and was started on plavix 06/2012 3) CKD stage III - baseline Creatinine 2.3-2.5 4) HTN 5) Polysubstance abuse 6) Vtach 7) hypokalemia 8) DM 9) Hypnatremia  Plan/Discussion:    [name] is well known to the HF team and was last seen in the HF clinic 02/2013. He has end-stage HF admitted with markedly volume overloaded likely in the setting of non-compliance (was out of medications for about a week).  Volume status much improved. CVP 5. Stop lasix drip. Start torsemide 60 mg twice a day.  CO-OX 85%. Cut back milrinone to 0.25 mcg.No beta blocker yet. He was again + for cocaine.   No ace due to CKD. Watch sodium closely.   Consult cardiac rehab.   Uncontrolled DM. Consult diabetes coordinator.   Consult SW for drug abuse.   Length of Stay: 6 [name],Jonathan [name]-C 07/02/2013, 7:47 AM  Advanced Heart Failure Team Pager [phone] (M-F; 7a - 4p)  Please contact [facility] for night-coverage after hours (4p -7a ) and weekends on [email or web address]  Patient seen and examined with Jonathan BecketAmy [name], [name]. We discussed all aspects of the encounter. I agree with the assessment and plan as stated above.   He is much improved. Volume status and renal function back to baseline. Agree with stopping IV lasix. Begin milrinone wean. Gout much better with steroids. Stop colchicine with renal failure. Start renally-dosed allopurinol. Start hydralazine/nitrates.   Jonathan Corter,MD 8:51 AM

## 2013-07-03 LAB — CARBOXYHEMOGLOBIN
CARBOXYHEMOGLOBIN: 1.6 % — AB (ref 0.5–1.5)
Methemoglobin: 0.6 % (ref 0.0–1.5)
O2 SAT: 57 %
Total hemoglobin: 16.3 g/dL (ref 13.5–18.0)

## 2013-07-03 LAB — GLUCOSE, CAPILLARY
Glucose-Capillary: 434 mg/dL — ABNORMAL HIGH (ref 70–99)
Glucose-Capillary: 274 mg/dL — ABNORMAL HIGH (ref 70–99)
Glucose-Capillary: 440 mg/dL — ABNORMAL HIGH (ref 70–99)
Glucose-Capillary: 166 mg/dL — ABNORMAL HIGH (ref 70–99)
Glucose-Capillary: 220 mg/dL — ABNORMAL HIGH (ref 70–99)

## 2013-07-03 LAB — HEMOGLOBIN A1C
HEMOGLOBIN A1C: 13.5 % — AB (ref <5.7)
MEAN PLASMA GLUCOSE: 341 mg/dL — AB (ref <117)

## 2013-07-03 MED ORDER — INSULIN GLARGINE 100 UNIT/ML ~~LOC~~ SOLN
20.0000 [IU] | Freq: Every day | SUBCUTANEOUS | Status: DC
  Administered 2013-07-03 – 2013-07-04 (×2): 20 [IU] via SUBCUTANEOUS
  Filled 2013-07-03 (×3): qty 0.2

## 2013-07-03 MED ORDER — INSULIN ASPART 100 UNIT/ML ~~LOC~~ SOLN
0.0000 [IU] | Freq: Three times a day (TID) | SUBCUTANEOUS | Status: DC
  Administered 2013-07-03: 11 [IU] via SUBCUTANEOUS
  Administered 2013-07-03: 4 [IU] via SUBCUTANEOUS
  Administered 2013-07-03: 20 [IU] via SUBCUTANEOUS
  Administered 2013-07-04: 7 [IU] via SUBCUTANEOUS
  Administered 2013-07-04: 4 [IU] via SUBCUTANEOUS
  Administered 2013-07-04: 11 [IU] via SUBCUTANEOUS

## 2013-07-03 MED ORDER — INSULIN GLARGINE 100 UNIT/ML ~~LOC~~ SOLN
10.0000 [IU] | Freq: Once | SUBCUTANEOUS | Status: AC
  Administered 2013-07-03: 10 [IU] via SUBCUTANEOUS
  Filled 2013-07-03: qty 0.1

## 2013-07-03 NOTE — Progress Notes (Signed)
Advanced Heart Failure Rounding Note  [facility]P: [facility] (Dr. Loistine Chance) Psychiatrist: Dr. Anthony Sar Subjective:    Mr. Wachtler is a 48 y/o male with HTN, DM2, longstanding HF due to NICM s/p ICD, (probably related to ETOH/drug abuse), CAD (thombotic occlusion of the distal OM1 too small for PCI and was started on plavix 06/2012) , CKD stage III, OSA, drug abuse and noncompliance.   He has not been seen in the HF clinic since 02/2013. Admitted in 03/2013 for CP and cough and was treated for CAP. Reports he has been out of medications however we provided them for him and paid for them last month. Called clinic a couple weeks back and was instructed to take metolazone for a couple of days.   Presented to the ED with weight gain and SOB 6/9.He has been diuresing a lasix drip and milrinone . Yesterday Milrinone was cut back to 0.25 mcg and he was started on hydralazine/imdur. Overall weight is down  35  pounds.   Denies SOB. Wants more food.   CO-OX 85>57%  Creatinine 2.6>2.4 >pending CVP 5 Objective:   Weight Range:  Vital Signs:   Temp:  [97.3 F (36.3 C)-98.7 F (37.1 C)] 98.5 F (36.9 C) (06/16 0500) Pulse Rate:  [68-139] 68 (06/16 0500) Resp:  [16-24] 19 (06/16 0500) BP: (110-119)/(74-98) 119/87 mmHg (06/16 0641) SpO2:  [96 %-98 %] 97 % (06/16 0500) Weight:  [207 lb 12.8 oz (94.257 kg)] 207 lb 12.8 oz (94.257 kg) (06/16 0500) Last BM Date: 06/28/13  Weight change: Filed Weights   07/02/13 0517 07/02/13 0700 07/03/13 0500  Weight: 225 lb 5 oz (102.2 kg) 213 lb (96.616 kg) 207 lb 12.8 oz (94.257 kg)    Intake/Output:   Intake/Output Summary (Last 24 hours) at 07/03/13 0808 Last data filed at 07/03/13 0700  Gross per 24 hour  Intake 1238.4 ml  Output   5100 ml  Net -3861.6 ml     Physical Exam: CVP  General:  Chronically ill appearing. Fatigued No resp difficulty. Sitting on the side of the bed. Lying in bed.  HEENT: normal Neck: supple. JVP 5-6 . Carotids 2+  bilat; no bruits. No lymphadenopathy or thryomegaly appreciated. Cor: PMI normal. Regular rate & rhythm. 2/6 systolic murmur LLSB. + S3 and RV lift Lungs: clear Abdomen: soft, tender, mildy distended. No hepatosplenomegaly. No bruits or masses. Good bowel sounds. Extremities: no cyanosis, clubbing, rash,  edema Neuro: alert & orientedx3, cranial nerves grossly intact. moves all 4 extremities w/o difficulty. Affect pleasant  Telemetry: ST 100s with occasional PVC  Labs: Basic Metabolic Panel:  Recent Labs Lab 06/27/13 0356 06/28/13 0555 06/29/13 0630 06/30/13 0543 07/01/13 0330 07/02/13 0458  NA 135* 135* 136* 133* 129* 128*  K 3.6* 3.4* 3.7 3.3* 3.9 3.9  CL 92* 91* 94* 88* 84* 81*  CO2 29 27 28 31 31  32  GLUCOSE 297* 186* 159* 131* 387* 335*  BUN 59* 56* 55* 56* 57* 65*  CREATININE 2.36* 2.48* 2.51* 2.74* 2.61* 2.44*  CALCIUM 9.2 8.9 9.0 9.8 9.9 10.1  MG 2.1 2.0  --   --   --   --     Liver Function Tests:  Recent Labs Lab 06/26/13 2137  AST 23  ALT 9  ALKPHOS 71  BILITOT 1.8*  PROT 7.5  ALBUMIN 3.0*   No results found for this basename: LIPASE, AMYLASE,  in the last 168 hours No results found for this basename: AMMONIA,  in the last 168 hours  CBC:  Recent Labs Lab 06/26/13 2137  WBC 4.8  NEUTROABS 2.8  HGB 15.7  HCT 47.0  MCV 87.4  PLT 190    Cardiac Enzymes:  Recent Labs Lab 06/27/13 0356 06/27/13 0758  TROPONINI <0.30 <0.30    BNP: BNP (last 3 results)  Recent Labs  12/11/12 1021 04/10/13 1850 06/26/13 2137  PROBNP 2957.0* 5986.0* 6208.0*     Other results:  EKG: ST 109 bpm  Imaging: No results found.   Medications:     Scheduled Medications: . allopurinol  100 mg Oral Daily  . aspirin EC  81 mg Oral Daily  . atorvastatin  80 mg Oral q1800  . famotidine  20 mg Oral Daily  . heparin  5,000 Units Subcutaneous 3 times per day  . hydrALAZINE  25 mg Oral 3 times per day  . insulin aspart  0-5 Units Subcutaneous QHS  .  insulin aspart  0-9 Units Subcutaneous TID WC  . insulin glargine  15 Units Subcutaneous QHS  . isosorbide mononitrate  30 mg Oral Daily  . sodium chloride  3 mL Intravenous Q12H  . torsemide  60 mg Oral BID    Infusions: . milrinone 0.25 mcg/kg/min (07/03/13 0400)    PRN Medications: sodium chloride, acetaminophen, ondansetron (ZOFRAN) IV, oxyCODONE, sodium chloride, SUMAtriptan   Assessment:   1) A/C systolic HF - EF 16%10%, RV mildly dilated and systolic fx moderately reduced 2) CAD - thombotic occlusion of the distal OM1 too small for PCI and was started on plavix 06/2012 3) CKD stage III - baseline Creatinine 2.3-2.5 4) HTN 5) Polysubstance abuse 6) Vtach 7) hypokalemia 8) DM 9) Hypnatremia  Plan/Discussion:    Mr. Georgina PillionMassey is well known to the HF team and was last seen in the HF clinic 02/2013. He has end-stage HF admitted with markedly volume overloaded likely in the setting of non-compliance (was out of medications for about a week).  Volume status much improved. Weight down another 6 pounds. Overall weight is down 35 pounds. Start torsemide 60 mg twice a day.  CO-OX 56%. Cut back milrinone to 0.125 mcg and watch closely. He is not a candidate for home milrinone due to ongoing drug abuse and noncompliance. No beta blocker yet. He was again + for cocaine.   Continue current dose of hydralazine/imdurNo ace due to CKD. BMET pending.   Consult cardiac rehab.   Uncontrolled DM. Consult diabetes coordinator. Increase lantus 20 units at hs.   Consult SW for drug abuse.   Length of Stay: 7 CLEGG,AMY NP-C 07/03/2013, 8:08 AM  Advanced Heart Failure Team Pager [phone] (M-F; 7a - 4p)  Please contact [facility] for night-coverage after hours (4p -7a ) and weekends on [email or web address]  Patient seen and examined with Tonye BecketAmy Clegg, NP. We discussed all aspects of the encounter. I agree with the assessment and plan as stated above.   HF much improved. Agree with milrinone wean and  switch to oral diuretics. Gout improved. I discussed case with Dr. Gibson RampFeldman from Hospice and they will take him back after discharge. I have confirmed that he is not a candidate for home inotropes.   Daniel Bensimhon,MD 10:36 AM

## 2013-07-03 NOTE — Progress Notes (Signed)
Inpatient Diabetes Program Recommendations  AACE/ADA: New Consensus Statement on Inpatient Glycemic Control (2013)  Target Ranges:  Prepandial:   less than 140 mg/dL      Peak postprandial:   less than 180 mg/dL (1-2 hours)      Critically ill patients:  140 - 180 mg/dL   Inpatient Diabetes Program Recommendations Insulin - Basal: Regardless of steroid therapy, Please increase lantus by 5 units until fasting glucose is maintained </= 150 mg/dL Correction (SSI): xxxxxxxxxxxxxxx Insulin - Meal Coverage: If prednisone is continued, please add meal coverage of 3 units tidwc.  (If Prednisone not continued, resume correction as ordered)  Thank you, Lenor Coffin, RN, CNS, Diabetes Coordinator [phone])

## 2013-07-03 NOTE — Progress Notes (Signed)
Pt. States he will inform staff when he is ready to wear his cpap. Pt. Did not want to be disturbed at this time.

## 2013-07-03 NOTE — Progress Notes (Signed)
CARDIAC REHAB PHASE I   PRE:  Rate/Rhythm: 99 SR with freq  PVC's  BP:  Supine:   Sitting: 108/76  Standing:    SaO2: 97 RA  MODE:  Ambulation: 350 ft   POST:  Rate/Rhythm: 109 St with freq PVC's  BP:  Supine:   Sitting: 132/83  Standing:    SaO2: 98 RA 1425-1345 Assisted X 1 to ambulate. Gait steady. Pt able to walk 350 feet without c/o. He is grumpy and states "I want to sleep." Pt back to side of bed after walk, getting ready to move to new room on [address]. Discussed with pt medication compliance, daily weights and when to call MD. Pt states that he had ran out of hs medications and did not have the money to get them refilled. He states that he called his primary care physician and that would not listen to him that his fluid medications were not working. We discussed calling the HF clinic when he was having issues with fluid and to call before he gets to the last of his medications. Pt states that he is tired of [place] fussing at him, attempted to explain that we were trying to help him.  Melina Copa RN 07/03/2013 2:54 PM

## 2013-07-03 NOTE — Progress Notes (Signed)
Follow-up: spoke with [facility] Debbie and pt at bedside; pt not yet medically ready for discharge; [facility] Liaison continues to follow for needs at discharge.   Valente David, RN [phone]

## 2013-07-04 LAB — BASIC METABOLIC PANEL
BUN: 81 mg/dL — AB (ref 6–23)
CALCIUM: 10.2 mg/dL (ref 8.4–10.5)
CO2: 34 meq/L — AB (ref 19–32)
Chloride: 83 mEq/L — ABNORMAL LOW (ref 96–112)
Creatinine, Ser: 2.47 mg/dL — ABNORMAL HIGH (ref 0.50–1.35)
GFR calc Af Amer: 35 mL/min — ABNORMAL LOW (ref >90)
GFR, EST NON AFRICAN AMERICAN: 30 mL/min — AB (ref >90)
GLUCOSE: 171 mg/dL — AB (ref 70–99)
Potassium: 3.3 mEq/L — ABNORMAL LOW (ref 3.7–5.3)
Sodium: 134 mEq/L — ABNORMAL LOW (ref 137–147)

## 2013-07-04 LAB — GLUCOSE, CAPILLARY
GLUCOSE-CAPILLARY: 241 mg/dL — AB (ref 70–99)
Glucose-Capillary: 169 mg/dL — ABNORMAL HIGH (ref 70–99)
Glucose-Capillary: 284 mg/dL — ABNORMAL HIGH (ref 70–99)
Glucose-Capillary: 311 mg/dL — ABNORMAL HIGH (ref 70–99)

## 2013-07-04 MED ORDER — METOLAZONE 5 MG PO TABS
5.0000 mg | ORAL_TABLET | Freq: Once | ORAL | Status: AC
  Administered 2013-07-04: 5 mg via ORAL
  Filled 2013-07-04 (×2): qty 1

## 2013-07-04 MED ORDER — POTASSIUM CHLORIDE CRYS ER 20 MEQ PO TBCR
40.0000 meq | EXTENDED_RELEASE_TABLET | Freq: Once | ORAL | Status: AC
  Administered 2013-07-04: 40 meq via ORAL
  Filled 2013-07-04: qty 2

## 2013-07-04 MED ORDER — POTASSIUM CHLORIDE CRYS ER 20 MEQ PO TBCR
40.0000 meq | EXTENDED_RELEASE_TABLET | Freq: Two times a day (BID) | ORAL | Status: DC
  Administered 2013-07-04 (×2): 40 meq via ORAL
  Filled 2013-07-04 (×4): qty 2

## 2013-07-04 MED ORDER — SODIUM CHLORIDE 0.9 % IJ SOLN
10.0000 mL | INTRAMUSCULAR | Status: DC | PRN
  Administered 2013-07-04: 30 mL

## 2013-07-04 NOTE — Progress Notes (Signed)
Pt states he is able to place himself on and off cpap.  Encouraged pt to call if help is needed

## 2013-07-04 NOTE — Progress Notes (Signed)
Advanced Heart Failure Rounding Note  [facility]P: [facility] (Dr. Loistine Chance) Psychiatrist: Dr. Anthony Sar Subjective:    [name] is a 48 y/o male with HTN, DM2, longstanding HF due to NICM s/p ICD, (probably related to ETOH/drug abuse), CAD (thombotic occlusion of the distal OM1 too small for PCI and was started on plavix 06/2012) , CKD stage III, OSA, drug abuse and noncompliance.   He has not been seen in the HF clinic since 02/2013. Admitted in 03/2013 for CP and cough and was treated for CAP. Reports he has been out of medications however we provided them for him and paid for them last month. Called clinic a couple weeks back and was instructed to take metolazone for a couple of days.   Presented to the ED with weight gain and SOB 6/9.He has been diuresing a lasix drip and milrinone . Yesterday Milrinone was cut back to 0.125 mcg and he was started on hydralazine/imdur. Overall weight is down 40 pounds.   Denies SOB. Wants more food.    Creatinine 2.4  Objective:   Weight Range:  Vital Signs:   Temp:  [97.4 F (36.3 C)-98.1 F (36.7 C)] 98.1 F (36.7 C) (06/17 0607) Pulse Rate:  [49-58] 49 (06/17 0607) Resp:  [18-21] 18 (06/17 0607) BP: (104-138)/(62-84) 110/79 mmHg (06/17 0607) SpO2:  [97 %-100 %] 98 % (06/17 0607) Weight:  [206 lb (93.441 kg)-211 lb 3.2 oz (95.8 kg)] 206 lb (93.441 kg) (06/17 0607) Last BM Date: 07/03/13  Weight change: Filed Weights   07/03/13 0500 07/03/13 1544 07/04/13 0607  Weight: 207 lb 12.8 oz (94.257 kg) 211 lb 3.2 oz (95.8 kg) 206 lb (93.441 kg)    Intake/Output:   Intake/Output Summary (Last 24 hours) at 07/04/13 0831 Last data filed at 07/04/13 4975  Gross per 24 hour  Intake 970.58 ml  Output   2500 ml  Net -1529.42 ml     Physical Exam:   General:  Chronically ill appearing. Fatigued No resp difficulty. Lying in bed.  HEENT: normal Neck: supple. JVP 5-6 . Carotids 2+ bilat; no bruits. No lymphadenopathy or thryomegaly  appreciated. Cor: PMI normal. Regular rate & rhythm. 2/6 systolic murmur LLSB. + S3 and RV lift Lungs: clear Abdomen: soft, tender, mildy distended. No hepatosplenomegaly. No bruits or masses. Good bowel sounds. Extremities: no cyanosis, clubbing, rash,  edema Neuro: alert & orientedx3, cranial nerves grossly intact. moves all 4 extremities w/o difficulty. Affect pleasant  Telemetry: ST 100s with occasional PVC  Labs: Basic Metabolic Panel:  Recent Labs Lab 06/28/13 0555 06/29/13 0630 06/30/13 0543 07/01/13 0330 07/02/13 0458 07/04/13 0615  NA 135* 136* 133* 129* 128* 134*  K 3.4* 3.7 3.3* 3.9 3.9 3.3*  CL 91* 94* 88* 84* 81* 83*  CO2 27 28 31 31  32 34*  GLUCOSE 186* 159* 131* 387* 335* 171*  BUN 56* 55* 56* 57* 65* 81*  CREATININE 2.48* 2.51* 2.74* 2.61* 2.44* 2.47*  CALCIUM 8.9 9.0 9.8 9.9 10.1 10.2  MG 2.0  --   --   --   --   --     Liver Function Tests: No results found for this basename: AST, ALT, ALKPHOS, BILITOT, PROT, ALBUMIN,  in the last 168 hours No results found for this basename: LIPASE, AMYLASE,  in the last 168 hours No results found for this basename: AMMONIA,  in the last 168 hours  CBC: No results found for this basename: WBC, NEUTROABS, HGB, HCT, MCV, PLT,  in the last 168  hours  Cardiac Enzymes: No results found for this basename: CKTOTAL, CKMB, CKMBINDEX, TROPONINI,  in the last 168 hours  BNP: BNP (last 3 results)  Recent Labs  12/11/12 1021 04/10/13 1850 06/26/13 2137  PROBNP 2957.0* 5986.0* 6208.0*     Other results:    Imaging: No results found.   Medications:     Scheduled Medications: . allopurinol  100 mg Oral Daily  . aspirin EC  81 mg Oral Daily  . atorvastatin  80 mg Oral q1800  . famotidine  20 mg Oral Daily  . heparin  5,000 Units Subcutaneous 3 times per day  . hydrALAZINE  25 mg Oral 3 times per day  . insulin aspart  0-20 Units Subcutaneous TID WC  . insulin aspart  0-5 Units Subcutaneous QHS  . insulin  glargine  20 Units Subcutaneous QHS  . isosorbide mononitrate  30 mg Oral Daily  . sodium chloride  3 mL Intravenous Q12H  . torsemide  60 mg Oral BID    Infusions: . milrinone 0.125 mcg/kg/min (07/03/13 1602)    PRN Medications: sodium chloride, acetaminophen, ondansetron (ZOFRAN) IV, oxyCODONE, sodium chloride, SUMAtriptan   Assessment:   1) A/C systolic HF - EF 16%10%, RV mildly dilated and systolic fx moderately reduced 2) CAD - thombotic occlusion of the distal OM1 too small for PCI and was started on plavix 06/2012 3) CKD stage III - baseline Creatinine 2.3-2.5 4) HTN 5) Polysubstance abuse 6) Vtach 7) hypokalemia 8) DM 9) Hypnatremia  Plan/Discussion:    [name] is well known to the HF team and was last seen in the HF clinic 02/2013. He has end-stage HF admitted with markedly volume overloaded likely in the setting of non-compliance (was out of medications for about a week).  Stop milrinone. Volume status much improved. Weight down another 5 pounds. Overall weight is down 40 pounds. Continue  torsemide 60 mg twice a day.  He is not a candidate for home milrinone due to ongoing drug abuse and noncompliance. No beta blocker yet. He was again + for cocaine.   Continue current dose of hydralazine/imdur. No ace due to CKD.   Uncontrolled DM. Glucose better controlled. Continue lantus 20 units at hs.   Home tomorrow with [facility]  Length of Stay: 8 CLEGG,AMY NP-C 07/04/2013, 8:31 AM  Advanced Heart Failure Team Pager [phone] (M-F; 7a - 4p)  Please contact [facility] for night-coverage after hours (4p -7a ) and weekends on [email or web address]  Patient seen and examined with Tonye BecketAmy Clegg, NP. We discussed all aspects of the encounter. I agree with the assessment and plan as stated above.   Now off milrinone. Volume status much improved but still with some volume left. Will add metolazone today. Probably home tomorrow. Will need to help with meds.   Daniel  Bensimhon,MD 9:12 AM

## 2013-07-04 NOTE — Discharge Summary (Signed)
Advanced Heart Failure Team  Discharge Summary   Patient ID: [name] MRN: [id number], DOB/AGE: [birth year] 48 y.o. 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  He has not been seen in the HF clinic since February 2015.    Admitted to [facility] with weight gain and increased dyspnea in the setting of medication noncompliance.Prior to admit he was out of his medications for over 2 weeks. He did not respond well to a lasix drip and renal function began to deteriorate. So central access was obtained and he was found to have a low co-ox and markedly elevated CVP suggested of cardiogenic shock with marked volume overload.  Milrinone was started. He was diuresed with lasix drip, metolazone. He diuresed about 40 pounds  As his volume status improved lasix drip was stopped and milrinone was weaned off. He was placed on low dose carvedilol and hydralazine. Imdur was stopped due to headaches. He is not on Acee due to CKD.  He was not placed on spironolactone due to ongoing noncompliance.   Social Work met with him and he denies substance abuse despite being positive for cocaine the last 6 admissions. Due to end stage heart failure he will be followed by [facility]. He is not a candidate for advanced therapies due to ongoing noncompliance  and drug abuse. He will continue to be followed in the HF clinic and has follow up June 25 at 1:15.     Discharge Weight Range:  Discharge Vitals: Blood pressure 108/82, pulse 54, temperature 98.6 F (37 C), temperature source Oral, resp. rate 20, height 6' (1.829 m), weight 206 lb 5.6 oz (93.6 kg), SpO2 96.00%.  Physical Exam:  General: Chronically ill appearing. Fatigued No resp difficulty. Sitting on the side of bed.   HEENT: normal  Neck: supple. JVP 5-6 . Carotids 2+ bilat; no bruits. No lymphadenopathy or thryomegaly appreciated.  Cor: PMI normal. Regular rate & rhythm. 2/6 systolic murmur LLSB. + S3 and RV lift  Lungs: clear  Abdomen: soft, tender, mildy distended. No hepatosplenomegaly. No bruits or masses. Good bowel sounds.  Extremities: no cyanosis, clubbing, rash, edema  Neuro: alert & orientedx3, cranial nerves grossly intact. moves all 4 extremities w/o difficulty. Affect pleasant  Telemetry: ST 100s with occasional PVC   Labs: Lab Results  Component Value Date   WBC 4.8 06/26/2013   HGB 15.7 06/26/2013   HCT 47.0 06/26/2013   MCV 87.4 06/26/2013   PLT 190 06/26/2013     Recent Labs Lab 07/05/13 0500  NA 131*  K 3.5*  CL 85*  CO2 30  BUN 87*  CREATININE 2.44*  CALCIUM 9.7  GLUCOSE 177*   Lab Results  Component Value Date   CHOL 98 11/02/2012   HDL 29* 11/02/2012   [place]  57 11/02/2012   TRIG 61 11/02/2012   BNP (last 3 results)  Recent Labs  12/11/12 1021 04/10/13 1850 06/26/13 2137  PROBNP 2957.0* 5986.0* 6208.0*    Diagnostic Studies/Procedures   No results found.  Discharge Medications     Medication List         allopurinol 100 MG tablet  Commonly known as:  ZYLOPRIM  Take 1 tablet (100 mg total) by mouth daily.     aspirin 81 MG EC tablet  Take 1 tablet (81 mg total) by mouth daily.     atorvastatin 40 MG tablet  Commonly known as:  LIPITOR  Take 80 mg by mouth daily.     carvedilol 3.125 MG tablet  Commonly known as:  COREG   Take 3.125 mg by mouth 2 (two) times daily with a meal.     digoxin 0.125 MG tablet  Commonly known as:  LANOXIN  Take 0.125 mg by mouth daily.     gabapentin 100 MG capsule  Commonly known as:  NEURONTIN  Take 2 tablets in the morning and 2 tablets in the evening.     hydrALAZINE 25 MG tablet  Commonly known as:  APRESOLINE  Take 1 tablet (25 mg total) by mouth every 8 (eight) hours.     insulin glargine 100 UNIT/ML injection  Commonly known as:  LANTUS  Inject 0.2 mLs (20 Units total) into the skin at bedtime.     metolazone 2.5 MG tablet  Commonly known as:  ZAROXOLYN  Take 1 tablet (2.5 mg total) by mouth as needed.     ondansetron 4 MG tablet  Commonly known as:  ZOFRAN  Take 1 tablet (4 mg total) by mouth every 8 (eight) hours as needed for nausea or vomiting.     oxyCODONE 5 MG immediate release tablet  Commonly known as:  Oxy IR/ROXICODONE  Take 1 tablet (5 mg total) by mouth every 8 (eight) hours as needed for severe pain.     potassium chloride SA 20 MEQ tablet  Commonly known as:  K-DUR,KLOR-CON  Take 40 mEq by mouth daily.     ranitidine 150 MG tablet  Commonly known as:  ZANTAC  Take 150 mg by mouth daily.     rosuvastatin 10 MG tablet  Commonly known as:  CRESTOR  Take 10 mg by mouth daily.     torsemide 20 MG tablet  Commonly known as:  DEMADEX  Take 3 tablets (60 mg total) by mouth 2 (two) times daily.        Disposition   The patient will be discharged in stable condition to home. Discharge Instructions   Contraindication to ACEI at discharge    Complete by:  As directed      Diet - low sodium heart healthy    Complete by:  As directed      Heart Failure patients record your daily weight using the same scale at the same time of day    Complete by:  As directed      Increase activity slowly    Complete by:  As directed           Follow-up Information   Follow up with CLEGG,AMY, NP On 07/12/2013. (HF clinic at [facility] At 1:15 )     Specialty:  Nurse Practitioner   Contact information:   [address] [address] [phone]         Duration of Discharge Encounter: Greater than 35 minutes  Signed, CLEGG,AMY NP-C  07/05/2013, 9:22 AM  Patient seen and examined with Darrick Grinder, NP. We discussed all aspects of the encounter. I agree with the assessment and plan as stated above. I have edited the note with my changes.  Benay Spice 10:39 PM

## 2013-07-04 NOTE — Progress Notes (Signed)
Follow-up for discharge plan -chart notes reviewed, spoke with pt at bedside who indicated his understanding was he would be discharging home tomorrow; pt stated he does not have transportation and asked if this writer could contact Ambrose Pancoast who is pt's caseworker with [employer] program her wk # [phone] c# [phone].  Writer spoke with Ms Olevia Bowens and relayed pt has transferred rooms to 3E14 and plan is for d/c tomorrow- Ms Olevia Bowens indicated their agency does not personally transport people but could possibly help with cab voucher if needed and she requests writer give her information to [facility] CMRN to contact her. *Ms Olevia Bowens would like to be notified when pt leaves th hospital so she can arrange to be at, or near, pt's apartment when he arrives home to assist with him settling back in at home. *Pt and Ms Olevia Bowens aware that [facility] MD and assessment RN plan to see pt once he is at home -if possible, please plan for discharge as close to noon as possible Discussed home DME needs -at this time pt denies needs stated he would like to discuss further once home, may want to consider a shower chair  Contact numbers for Ms Olevia Bowens and above issues discussed with CMRN Durward Mallard who will follow up re: transportation needs [facility] aware of possible discharge tomorrow. Please call with any questions/concerns Valente David, RN MSN [facility] Liaison  [phone]

## 2013-07-04 NOTE — Discharge Instructions (Signed)

## 2013-07-04 NOTE — Progress Notes (Signed)
Pt had 5 beats of V-Tach, asymptomatic, resting in bed quietly, alert and oriented times 4, Milrinone discontinued by [facility] nurse in to visit with patient, report given to on-coming nurse.

## 2013-07-04 NOTE — Progress Notes (Signed)
CARDIAC REHAB PHASE I   PRE:  Rate/Rhythm: 104 ST    BP: sitting 111/81    SaO2: 99 RA  MODE:  Ambulation: 550 ft   POST:  Rate/Rhythm: 111 sT    BP: sitting 108/75     SaO2: 96 RA  Asleep, reluctant to walk. Sts he doesn't sleep much at night. Shuffled steps entire walk, slow. Sts he doesn't know why he has to walk when he is to d/c tomorrow. Slight SOB but much better than admit. Denies questions, just anxious to go home. Declined formal ed.  Encouraged x3 walks a day at home, and daily wts.  [phone]   Elissa Lovett [place] CES, ACSM 07/04/2013 3:10 PM

## 2013-07-05 LAB — BASIC METABOLIC PANEL
BUN: 87 mg/dL — ABNORMAL HIGH (ref 6–23)
CO2: 30 mEq/L (ref 19–32)
Calcium: 9.7 mg/dL (ref 8.4–10.5)
Chloride: 85 mEq/L — ABNORMAL LOW (ref 96–112)
Creatinine, Ser: 2.44 mg/dL — ABNORMAL HIGH (ref 0.50–1.35)
GFR calc Af Amer: 35 mL/min — ABNORMAL LOW (ref >90)
GFR calc non Af Amer: 30 mL/min — ABNORMAL LOW (ref >90)
GLUCOSE: 177 mg/dL — AB (ref 70–99)
POTASSIUM: 3.5 meq/L — AB (ref 3.7–5.3)
Sodium: 131 mEq/L — ABNORMAL LOW (ref 137–147)

## 2013-07-05 LAB — GLUCOSE, CAPILLARY: Glucose-Capillary: 235 mg/dL — ABNORMAL HIGH (ref 70–99)

## 2013-07-05 MED ORDER — POTASSIUM CHLORIDE CRYS ER 20 MEQ PO TBCR
40.0000 meq | EXTENDED_RELEASE_TABLET | Freq: Once | ORAL | Status: AC
  Administered 2013-07-05: 40 meq via ORAL
  Filled 2013-07-05: qty 2

## 2013-07-05 MED ORDER — HYDRALAZINE HCL 25 MG PO TABS: 25.0000 mg | ORAL_TABLET | Freq: Three times a day (TID) | ORAL | Status: DC

## 2013-07-05 MED ORDER — ASPIRIN 81 MG PO TBEC: 81.0000 mg | DELAYED_RELEASE_TABLET | Freq: Every day | ORAL | Status: AC

## 2013-07-05 MED ORDER — ONDANSETRON HCL 4 MG PO TABS: 4.0000 mg | ORAL_TABLET | Freq: Three times a day (TID) | ORAL | Status: AC | PRN

## 2013-07-05 MED ORDER — ALLOPURINOL 100 MG PO TABS: 100.0000 mg | ORAL_TABLET | Freq: Every day | ORAL | Status: DC

## 2013-07-05 MED ORDER — OXYCODONE HCL 5 MG PO TABS: 5.0000 mg | ORAL_TABLET | Freq: Three times a day (TID) | ORAL | Status: DC | PRN

## 2013-07-05 MED ORDER — ONDANSETRON HCL 4 MG PO TABS
4.0000 mg | ORAL_TABLET | Freq: Three times a day (TID) | ORAL | Status: DC | PRN
  Administered 2013-07-05: 4 mg via ORAL
  Filled 2013-07-05: qty 1

## 2013-07-05 MED ORDER — INSULIN GLARGINE 100 UNIT/ML ~~LOC~~ SOLN: 20.0000 [IU] | Freq: Every day | SUBCUTANEOUS | Status: AC

## 2013-07-05 MED ORDER — TORSEMIDE 20 MG PO TABS: 60.0000 mg | ORAL_TABLET | Freq: Two times a day (BID) | ORAL | Status: DC

## 2013-07-05 MED ORDER — METOLAZONE 2.5 MG PO TABS: 2.5000 mg | ORAL_TABLET | ORAL | Status: DC | PRN

## 2013-07-05 NOTE — Progress Notes (Signed)
Inpatient Diabetes Program Recommendations  AACE/ADA: New Consensus Statement on Inpatient Glycemic Control (2013)  Target Ranges:  Prepandial:   less than 140 mg/dL      Peak postprandial:   less than 180 mg/dL (1-2 hours)      Critically ill patients:  140 - 180 mg/dL  Results for JARRATT, NIESS (MRN [id number]) as of 07/05/2013 11:47  Ref. Range 07/04/2013 06:06 07/04/2013 12:27 07/04/2013 17:42 07/04/2013 21:32  Glucose-Capillary Latest Range: 70-99 mg/dL 497 (H) 530 (H) 051 (H) 311 (H)   Recommend adding Novolog 4 units TID with meals (per Glycemic Control Order-set)  for elevated postprandial CBGs.  Titrate as needed to control postprandials. Will follow. Thank you  Piedad Climes BSN, RN,CDE Inpatient Diabetes Coordinator [phone] (team pager)

## 2013-07-05 NOTE — Progress Notes (Signed)
Per MD- patient is medically stable for d/c today. CSW referred to talk to patient about transportation needs;  met with patient to discuss issues. Patient related that he does not have a ride or anyone to come and pick him up. CSW discussed transportation options- he cannot safely take a bus; cannot afford a taxi and does not qualify for EMS.  CSW spoke to Stella for [employer] who has been working with [place]. She stated that her agency would pay for patient's cab ride home through their petty cash.  Patient notified and was pleased with this. [employer] notified to come and pick up patient. Nursing notified.  CSW signing off. Lorie Phenix. [place], [place]  .

## 2013-07-05 NOTE — Progress Notes (Signed)
Patient refused CBG monitoring so no insulin sliding scale was given for the AM.  Educated and patient still refused.

## 2013-07-06 ENCOUNTER — Telehealth (HOSPITAL_COMMUNITY): Payer: Self-pay | Admitting: Vascular Surgery

## 2013-07-06 NOTE — Telephone Encounter (Signed)
Rozanna [facility] health nurse called about pt please call back

## 2013-07-12 ENCOUNTER — Telehealth (HOSPITAL_COMMUNITY): Payer: Self-pay

## 2013-07-12 ENCOUNTER — Encounter (HOSPITAL_COMMUNITY): Payer: Self-pay

## 2013-07-12 ENCOUNTER — Other Ambulatory Visit (HOSPITAL_COMMUNITY): Payer: Self-pay

## 2013-07-12 ENCOUNTER — Ambulatory Visit (HOSPITAL_COMMUNITY)
Admit: 2013-07-12 | Discharge: 2013-07-12 | Disposition: A | Discharge status: Home / Self Care | Source: Ambulatory Visit | Attending: Internal Medicine | Admitting: Internal Medicine

## 2013-07-12 VITALS — BP 99/74 | HR 98 | Resp 22 | Wt 211.1 lb

## 2013-07-12 DIAGNOSIS — Z7982 Long term (current) use of aspirin: Secondary | ICD-10-CM | POA: Diagnosis not present

## 2013-07-12 DIAGNOSIS — K219 Gastro-esophageal reflux disease without esophagitis: Secondary | ICD-10-CM | POA: Insufficient documentation

## 2013-07-12 DIAGNOSIS — I129 Hypertensive chronic kidney disease with stage 1 through stage 4 chronic kidney disease, or unspecified chronic kidney disease: Secondary | ICD-10-CM | POA: Diagnosis not present

## 2013-07-12 DIAGNOSIS — I252 Old myocardial infarction: Secondary | ICD-10-CM | POA: Insufficient documentation

## 2013-07-12 DIAGNOSIS — G7000 Myasthenia gravis without (acute) exacerbation: Secondary | ICD-10-CM | POA: Insufficient documentation

## 2013-07-12 DIAGNOSIS — G4733 Obstructive sleep apnea (adult) (pediatric): Secondary | ICD-10-CM | POA: Diagnosis not present

## 2013-07-12 DIAGNOSIS — Z794 Long term (current) use of insulin: Secondary | ICD-10-CM | POA: Diagnosis not present

## 2013-07-12 DIAGNOSIS — I428 Other cardiomyopathies: Secondary | ICD-10-CM | POA: Diagnosis not present

## 2013-07-12 DIAGNOSIS — F1410 Cocaine abuse, uncomplicated: Secondary | ICD-10-CM | POA: Diagnosis not present

## 2013-07-12 DIAGNOSIS — E119 Type 2 diabetes mellitus without complications: Secondary | ICD-10-CM | POA: Diagnosis not present

## 2013-07-12 DIAGNOSIS — N183 Chronic kidney disease, stage 3 unspecified: Secondary | ICD-10-CM | POA: Diagnosis not present

## 2013-07-12 DIAGNOSIS — Z8711 Personal history of peptic ulcer disease: Secondary | ICD-10-CM | POA: Insufficient documentation

## 2013-07-12 DIAGNOSIS — Z95810 Presence of automatic (implantable) cardiac defibrillator: Secondary | ICD-10-CM | POA: Insufficient documentation

## 2013-07-12 DIAGNOSIS — I5022 Chronic systolic (congestive) heart failure: Secondary | ICD-10-CM | POA: Insufficient documentation

## 2013-07-12 DIAGNOSIS — I509 Heart failure, unspecified: Secondary | ICD-10-CM | POA: Insufficient documentation

## 2013-07-12 DIAGNOSIS — I2510 Atherosclerotic heart disease of native coronary artery without angina pectoris: Secondary | ICD-10-CM | POA: Insufficient documentation

## 2013-07-12 LAB — BASIC METABOLIC PANEL
BUN: 75 mg/dL — AB (ref 6–23)
CO2: 27 meq/L (ref 19–32)
CREATININE: 2.27 mg/dL — AB (ref 0.50–1.35)
Calcium: 9.3 mg/dL (ref 8.4–10.5)
Chloride: 83 mEq/L — ABNORMAL LOW (ref 96–112)
GFR calc Af Amer: 38 mL/min — ABNORMAL LOW (ref >90)
GFR calc non Af Amer: 33 mL/min — ABNORMAL LOW (ref >90)
GLUCOSE: 336 mg/dL — AB (ref 70–99)
Potassium: 3.3 mEq/L — ABNORMAL LOW (ref 3.7–5.3)
Sodium: 128 mEq/L — ABNORMAL LOW (ref 137–147)

## 2013-07-12 LAB — PRO B NATRIURETIC PEPTIDE: Pro B Natriuretic peptide (BNP): 5773 pg/mL — ABNORMAL HIGH (ref 0–125)

## 2013-07-12 MED ORDER — POTASSIUM CHLORIDE CRYS ER 20 MEQ PO TBCR: 40.0000 meq | EXTENDED_RELEASE_TABLET | Freq: Two times a day (BID) | ORAL | Status: DC

## 2013-07-12 NOTE — Progress Notes (Signed)
Patient ID: Halford DecampJames L Lince, male   DOB: [birth year], 48 y.o.   MRN: [id number] Psychiatrist: Dr Anthony SarPolowski PCP: Dr Loistine ChanceIllath  HPI: Mr. Georgina PillionMassey is a 48 y/o male with HTN and DM2 with longstanding HF due to NICM (probably ETOH), EF 20%. Current cocaine abuse. Cath in 2008 showed left circumflex with 40% stenosis. Baseline weight about 210. Was previously on [facility] but this ended in December in 2013 as he was doing well.   Admitted 03/2011 with NYHA Class IV symptoms, requiring milrinone therapy and IV lasix. Cath 04/13/11: cardiogenic shock with biventricular failure (R>L) and possible restrictive physiology. RA = 22 with steep y-descents, RV = 45/12/25, PA = 51/25 (34), PCW = 18-20, Fick cardiac output/index = 3.1/1.4, Thermo CO/CI = 3.6/1.6, PVR = 4.8 Woods O2 sat = 85% (after sedation), PA sat = 31%, 36%. Milrinone was initiated for low output physiology.   [facility] witih CP/elevated troponin and increased SOB. He had a +USD for cocaine. He also had LHC 07/17/12 showing subtotal thrombotic occlusion of the distal OM1. It was too small at this point for PCI and was started on plavix.  Echo showed EF 10% with no LV thrombus and moderately decreased RV systolic function.  His d/c weight was 206 and now 228 lbs, though this is lower than 230 which was his weight at last appointment in this office.  Ad[facility] 08[facility]04/12 through 08/09/12 NSTEMI thought to be from non st elevation MI from cocaine induced vasospasm and volume overload. + cocaine. Dry weight 205-208 pounds. Discharge weight 208 pounds.   Admitted June 9 through June 18 with volume overload and low output heart failure.  He required lasix drip and milrinone. As he improved he was transitioned  to torsemide 60 mg twice a day. Discharge weight 206 pounds.   He returns for post hospital follow up. Complains of fatigue. SOB with exertion. + Orthopnea. Initially had nausea but says its improving. Has no help with his children. Trying to  get an aide to help with his children. [facility] by [facility]. Not weighing because he doesn't weigh . Taking all medications. Medications placed in pill box per [facility] .   Labs  7/14: K 4.8, creatinine 2 08/09/12 Potassium 4.2 Creatinine 2.0 8/14: K 4, creatinine 1.6, digoxin 0.8 11/02/12: K+ 3.3, Creatinine 1.89, cholesterol 98 12/11/12 K 3.1 Creatinine 2.26 Pro BNP 2957  07/05/13 K 3.5 Creatinine 2.44    SH: Lives in low income apartment. 3 children, lives in [place].  + cocaine abuse until recently, says he has quit.  Quit smoking.  FH: No premature CAD.  ROS: All systems negative except as listed in HPI, PMH and Problem List.  Past Medical History  Diagnosis Date  . Hypertension   . CHF (congestive heart failure)     a. 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 b. EF 20-25%, s/p St. Jude AICD placement 04/2007. c. Echo 06/2012: EF 10%.  . History of peptic ulcer disease     EGD 09/2007 shows prox gastric ulcer with black eschar, treated with PPI  . Depression   . History of alcohol abuse     quit approx 2011-2012  . OSA (obstructive sleep apnea)     Intolerant of CPAP  . ICD (implantable cardiac defibrillator) in place   . Diabetes mellitus     insulin dependent  . GERD (gastroesophageal reflux disease)   . Headache(784.0)   . Myasthenia gravis 10/12/2011  Diagnosed in 09/2011. Received Plasmapheresis. CT chest no Thymoma. Acetylcholine receptor antibody negative.    . CKD (chronic kidney disease) stage 3, GFR 30-59 ml/min   . Polysubstance abuse     Including cocaine (+ UDS multiple times in 2014)  . PUD (peptic ulcer disease)   . Pulmonary nodule     a. CT 09/2011.  Marland Kitchen CAD (coronary artery disease)     a. NSTEMI 06/2012: cath showed subtotal thrombotic occlusion of the distal OM1, too small for PCI, ?cocaine-triggered plaque rupture with thrombosis (no LV thrombus on f/u echo).   - NSTEMI (6/14) with subtotal thrombotic occlusion of a  distal OM1, too small for intervention.  No LV thrombus on echo.   Current Outpatient Prescriptions  Medication Sig Dispense Refill  . allopurinol (ZYLOPRIM) 100 MG tablet Take 1 tablet (100 mg total) by mouth daily.  30 tablet  6  . aspirin EC 81 MG EC tablet Take 1 tablet (81 mg total) by mouth daily.  30 tablet  6  . atorvastatin (LIPITOR) 40 MG tablet Take 80 mg by mouth daily.      . carvedilol (COREG) 3.125 MG tablet Take 3.125 mg by mouth 2 (two) times daily with a meal.      . digoxin (LANOXIN) 0.125 MG tablet Take 0.125 mg by mouth daily.      Marland Kitchen gabapentin (NEURONTIN) 100 MG capsule Take 2 tablets in the morning and 2 tablets in the evening.  120 capsule  1  . hydrALAZINE (APRESOLINE) 25 MG tablet Take 1 tablet (25 mg total) by mouth every 8 (eight) hours.  90 tablet  6  . insulin glargine (LANTUS) 100 UNIT/ML injection Inject 0.2 mLs (20 Units total) into the skin at bedtime.  10 mL  11  . metolazone (ZAROXOLYN) 2.5 MG tablet Take 1 tablet (2.5 mg total) by mouth as needed.  12 tablet  6  . ondansetron (ZOFRAN) 4 MG tablet Take 1 tablet (4 mg total) by mouth every 8 (eight) hours as needed for nausea or vomiting.  20 tablet  0  . oxyCODONE (OXY IR/ROXICODONE) 5 MG immediate release tablet Take 1 tablet (5 mg total) by mouth every 8 (eight) hours as needed for severe pain.  30 tablet  0  . potassium chloride SA (K-DUR,KLOR-CON) 20 MEQ tablet Take 40 mEq by mouth daily.      . ranitidine (ZANTAC) 150 MG tablet Take 150 mg by mouth daily.      . rosuvastatin (CRESTOR) 10 MG tablet Take 10 mg by mouth daily.      Marland Kitchen torsemide (DEMADEX) 20 MG tablet Take 3 tablets (60 mg total) by mouth 2 (two) times daily.  180 tablet  3  . [DISCONTINUED] metoprolol tartrate (LOPRESSOR) 25 MG tablet Take 1 tablet (25 mg total) by mouth 2 (two) times daily.  60 tablet  1  . [DISCONTINUED] traZODone (DESYREL) 25 mg TABS Take 0.5 tablets (25 mg total) by mouth at bedtime.  30 tablet  1   No current  facility-administered medications for this encounter.   PHYSICAL EXAM: Filed Vitals:   07/12/13 1318  BP: 99/74  Pulse: 98  Resp: 22  Weight: 211 lb 2 oz (95.766 kg)  SpO2: 100%   General:  Chronically ill appearing. Fatigued appearing. No resp difficulty;  HEENT: normal Neck: supple. JVP  jaw. Carotids 2+ bilaterally; no bruits. No lymphadenopathy or thryomegaly appreciated. Cor: PMI normal. Regular rate & rhythm. 2/6 systolic murmur LLSB. + S3  Lungs: clear  Abdomen: obese, soft, nontender, mildly distended. No hepatosplenomegaly. No bruits or masses. Good bowel sounds. Extremities: no cyanosis, clubbing, rash.  R and LLE 2+ ankle edema. Neuro: alert & orientedx3, cranial nerves grossly intact. Moves all 4 extremities w/o difficulty. Affect pleasant.  ASSESSMENT & PLAN: 1. CHF: Chronic systolic CHF, nonischemic cardiomyopathy.  Possibly related to cocaine abuse and prior ETOH abuse. ICD remains on. NYHA IIIB-IV  Volume status elevated. Continue torsemide 60 mg twice a day. Give one dose of metolazone today.  Will stop carvedilol as he was low output last admit. Today he is hypptensive and has profound fatigue.  Has ICD. Discussed turning it off as he is a [facility] patient however he would like it remain on.   - Continue digoxin 0.125 mg, hydralazine 25 mg TID and imdur 30 mg daily.    - No ACEI /spiro with CKD    Provided a scale for daily weights.  - Reinforced low salt food choices, limiting fluid intake and medication compliance.  - Check BMET now.  2. Cocaine abuse: Last admit + for cocaine.  4. CKD, stage III: Not on ACE-I or Spiro. Will check BMET today.  Follow up in 1 month   CLEGG,AMY NP-C 1:36 PM

## 2013-07-12 NOTE — Patient Instructions (Signed)
Follow up in 2  Weeks  Stop carvedilol   Take Metolazone 2.5 mg today   Stop crestor  Please weigh and record daily   Do the following things EVERYDAY: 1) Weigh yourself in the morning before breakfast. Write it down and keep it in a log. 2) Take your medicines as prescribed 3) Eat low salt foods-Limit salt (sodium) to 2000 mg per day.  4) Stay as active as you can everyday 5) Limit all fluids for the day to less than 2 liters

## 2013-07-12 NOTE — Telephone Encounter (Signed)
Lab results reviewed with patient, instructed to increase potassium to twice daily.  Patient complains of difficulty taking pills but does not want smaller pills in larger quantity or liquid form.  Advised to try dissolving in small amount of fluid and drinking.  Rx sent to preferred pharmacy electronically.  Aware and agreeable. Ave Filter

## 2013-07-23 ENCOUNTER — Telehealth (HOSPITAL_COMMUNITY): Payer: Self-pay | Admitting: Vascular Surgery

## 2013-07-23 NOTE — Telephone Encounter (Signed)
Jennifer from [facility] called she faxed plan of care and orders last week she wanted to know did you get the fax

## 2013-07-24 ENCOUNTER — Telehealth (HOSPITAL_COMMUNITY): Payer: Self-pay | Admitting: Cardiology

## 2013-07-24 DIAGNOSIS — M109 Gout, unspecified: Secondary | ICD-10-CM

## 2013-07-24 MED ORDER — PREDNISONE 20 MG PO TABS: 40.0000 mg | ORAL_TABLET | Freq: Every day | ORAL | Status: DC

## 2013-07-24 NOTE — Telephone Encounter (Signed)
[name] called after speaking with pt regarding L arm pain (elbow to wrist) Mild swelling, numbness Pt feels this is a gout flare Currently takes allopurinol Nurse has already advised pt to follow a low purine diet to help decrease uric acid/ trigger gout flare (NON COMPLIANT)  Symptoms reviewed with Dinah Beers Pt would benefit from small dose of prednisone 40 mg x 3 days rx sent to pharm and Nicole,RN aware

## 2013-07-25 NOTE — Progress Notes (Signed)
Patient ID: [name], male   DOB: 29-Sep-1965, 48 y.o.   MRN: [id number] Psychiatrist: Dr Anthony Sar PCP: Dr Loistine Chance  HPI: [name] is a 48 y/o male with HTN and DM2 with longstanding HF due to NICM (probably ETOH), EF 20%. Current cocaine abuse. Cath in 2008 showed left circumflex with 40% stenosis. Baseline weight about 210. Was previously on [facility] but this ended in December in 2013 as he was doing well.   Admitted 03/2011 with NYHA Class IV symptoms, requiring milrinone therapy and IV lasix. Cath 04/13/11: cardiogenic shock with biventricular failure (R>L) and possible restrictive physiology. RA = 22 with steep y-descents, RV = 45/12/25, PA = 51/25 (34), PCW = 18-20, Fick cardiac output/index = 3.1/1.4, Thermo CO/CI = 3.6/1.6, PVR = 4.8 Woods O2 sat = 85% (after sedation), PA sat = 31%, 36%. Milrinone was initiated for low output physiology.   Admitted to [facility] 06/2012 witih CP/elevated troponin and increased SOB. He had a +USD for cocaine. He also had LHC 07/17/12 showing subtotal thrombotic occlusion of the distal OM1. It was too small at this point for PCI and was started on plavix.  Echo showed EF 10% with no LV thrombus and moderately decreased RV systolic function.  His d/c weight was 206 and now 228 lbs, though this is lower than 230 which was his weight at last appointment in this office.  Admitted to [facility] 08/04/12 through 08/09/12 NSTEMI thought to be from non st elevation MI from cocaine induced vasospasm and volume overload. + cocaine. Dry weight 205-208 pounds. Discharge weight 208 pounds.   Admitted June 9 through June 18 with volume overload and low output heart failure.  He required lasix drip and milrinone. As he improved he was transitioned  to torsemide 60 mg twice a day. Discharge weight 206 pounds.   He returns for post hospital follow up. Last visit carvedilol was stopped due to hypotension and fatigue. He says he feels so much better and a=ble to do more things without dyspnea.  . Denies SOB/PND/Orthopnea/CP. Denies cocaine use. Taking all medications. [facility] following twice a week. Weight at home 213 pounds. Complains of fatigue. SOB with exertion. + Orthopnea. Initially had nausea but says its improving. Has no help with his children. Trying to get an aide to help with his children.    7/14: K 4.8, creatinine 2 08/09/12 Potassium 4.2 Creatinine 2.0 8/14: K 4, creatinine 1.6, digoxin 0.8 11/02/12: K+ 3.3, Creatinine 1.89, cholesterol 98 12/11/12 K 3.1 Creatinine 2.26 Pro BNP 2957  07/05/13 K 3.5 Creatinine 2.44  07/12/13 K 3.3 Creatinine 2.27   SH: Lives in low income apartment. 3 children, lives in [place].  + cocaine abuse until recently, says he has quit.  Quit smoking.  FH: No premature CAD.  ROS: All systems negative except as listed in HPI, PMH and Problem List.  Past Medical History  Diagnosis Date  . Hypertension   . CHF (congestive heart failure)     a. 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 b. EF 20-25%, s/p St. Jude AICD placement 04/2007. c. Echo 06/2012: EF 10%.  . History of peptic ulcer disease     EGD 09/2007 shows prox gastric ulcer with black eschar, treated with PPI  . Depression   . History of alcohol abuse     quit approx 2011-2012  . OSA (obstructive sleep apnea)     Intolerant of CPAP  . ICD (implantable cardiac defibrillator) in place   .  Diabetes mellitus     insulin dependent  . GERD (gastroesophageal reflux disease)   . Headache(784.0)   . Myasthenia gravis 10/12/2011    Diagnosed in 09/2011. Received Plasmapheresis. CT chest no Thymoma. Acetylcholine receptor antibody negative.    . CKD (chronic kidney disease) stage 3, GFR 30-59 ml/min   . Polysubstance abuse     Including cocaine (+ UDS multiple times in 2014)  . PUD (peptic ulcer disease)   . Pulmonary nodule     a. CT 09/2011.  Marland Kitchen CAD (coronary artery disease)     a. NSTEMI 06/2012: cath showed subtotal thrombotic occlusion of the  distal OM1, too small for PCI, ?cocaine-triggered plaque rupture with thrombosis (no LV thrombus on f/u echo).   - NSTEMI (6/14) with subtotal thrombotic occlusion of a distal OM1, too small for intervention.  No LV thrombus on echo.   Current Outpatient Prescriptions  Medication Sig Dispense Refill  . allopurinol (ZYLOPRIM) 100 MG tablet Take 1 tablet (100 mg total) by mouth daily.  30 tablet  6  . aspirin EC 81 MG EC tablet Take 1 tablet (81 mg total) by mouth daily.  30 tablet  6  . atorvastatin (LIPITOR) 40 MG tablet Take 80 mg by mouth daily.      . digoxin (LANOXIN) 0.125 MG tablet Take 0.125 mg by mouth daily.      Marland Kitchen gabapentin (NEURONTIN) 100 MG capsule Take 2 tablets in the morning and 2 tablets in the evening.  120 capsule  1  . hydrALAZINE (APRESOLINE) 25 MG tablet Take 1 tablet (25 mg total) by mouth every 8 (eight) hours.  90 tablet  6  . insulin glargine (LANTUS) 100 UNIT/ML injection Inject 0.2 mLs (20 Units total) into the skin at bedtime.  10 mL  11  . metolazone (ZAROXOLYN) 2.5 MG tablet Take 1 tablet (2.5 mg total) by mouth as needed.  12 tablet  6  . ondansetron (ZOFRAN) 4 MG tablet Take 1 tablet (4 mg total) by mouth every 8 (eight) hours as needed for nausea or vomiting.  20 tablet  0  . oxyCODONE (OXY IR/ROXICODONE) 5 MG immediate release tablet Take 1 tablet (5 mg total) by mouth every 8 (eight) hours as needed for severe pain.  30 tablet  0  . potassium chloride SA (K-DUR,KLOR-CON) 20 MEQ tablet Take 2 tablets (40 mEq total) by mouth 2 (two) times daily.  120 tablet  3  . predniSONE (DELTASONE) 20 MG tablet Take 2 tablets (40 mg total) by mouth daily with breakfast.  6 tablet  0  . ranitidine (ZANTAC) 150 MG tablet Take 150 mg by mouth daily.      Marland Kitchen torsemide (DEMADEX) 20 MG tablet Take 3 tablets (60 mg total) by mouth 2 (two) times daily.  180 tablet  3  . [DISCONTINUED] metoprolol tartrate (LOPRESSOR) 25 MG tablet Take 1 tablet (25 mg total) by mouth 2 (two) times daily.   60 tablet  1  . [DISCONTINUED] traZODone (DESYREL) 25 mg TABS Take 0.5 tablets (25 mg total) by mouth at bedtime.  30 tablet  1   No current facility-administered medications for this encounter.   PHYSICAL EXAM: Filed Vitals:   07/26/13 1047  BP: 94/70  Pulse: 94  Weight: 221 lb (100.245 kg)  SpO2: 93%   General:  Chronically ill appearing.  No resp difficulty;  HEENT: normal Neck: supple. JVP  jaw. Carotids 2+ bilaterally; no bruits. No lymphadenopathy or thryomegaly appreciated. Cor: PMI normal. Regular rate &  rhythm. 2/6 systolic murmur LLSB. + S3  Lungs: clear Abdomen: obese, soft, nontender, mildly distended. No hepatosplenomegaly. No bruits or masses. Good bowel sounds. Extremities: no cyanosis, clubbing, rash.  R and LLE trace-1+ ankle edema. Neuro: alert & orientedx3, cranial nerves grossly intact. Moves all 4 extremities w/o difficulty. Affect pleasant.when  ASSESSMENT & PLAN: 1. CHF: Chronic systolic CHF, nonischemic cardiomyopathy.  Possibly related to cocaine abuse and prior ETOH abuse. ICD remains on. NYHA IIIB with funcitonal improvement.  Volume status elevated. Continue torsemide 60 mg twice a day. Add metolazone 2.5 mg once a week.  Continue 40 meq potassium twice a day.  Keep off BB. Functionally able to do more.   Has ICD. Discussed turning it off as he is a [facility] patient however he would like it remain on.   - Continue digoxin 0.125 mg, hydralazine 25 mg TID and imdur 30 mg daily.   Will not up titrate due to soft BP.  - No ACEI /spiro with CKD   - Reinforced low salt food choices, limiting fluid intake and medication compliance.  - Check BMET now.  2. Cocaine abuse: Last admit + for cocaine. Encouraged to remain off cocaine.  4. CKD, stage III: Not on ACE-I or Spiro. Will check BMET today.  Follow up in 1 month   CLEGG,AMY NP-C 10:51 AM

## 2013-07-26 ENCOUNTER — Ambulatory Visit (HOSPITAL_COMMUNITY)
Admission: RE | Admit: 2013-07-26 | Discharge: 2013-07-26 | Disposition: A | Discharge status: Home / Self Care | Source: Ambulatory Visit | Attending: Internal Medicine | Admitting: Internal Medicine

## 2013-07-26 VITALS — BP 94/70 | HR 94 | Wt 220.0 lb

## 2013-07-26 DIAGNOSIS — F1010 Alcohol abuse, uncomplicated: Secondary | ICD-10-CM | POA: Diagnosis not present

## 2013-07-26 DIAGNOSIS — I428 Other cardiomyopathies: Secondary | ICD-10-CM | POA: Diagnosis not present

## 2013-07-26 DIAGNOSIS — F329 Major depressive disorder, single episode, unspecified: Secondary | ICD-10-CM | POA: Insufficient documentation

## 2013-07-26 DIAGNOSIS — G7000 Myasthenia gravis without (acute) exacerbation: Secondary | ICD-10-CM | POA: Insufficient documentation

## 2013-07-26 DIAGNOSIS — K219 Gastro-esophageal reflux disease without esophagitis: Secondary | ICD-10-CM | POA: Insufficient documentation

## 2013-07-26 DIAGNOSIS — K279 Peptic ulcer, site unspecified, unspecified as acute or chronic, without hemorrhage or perforation: Secondary | ICD-10-CM | POA: Insufficient documentation

## 2013-07-26 DIAGNOSIS — N183 Chronic kidney disease, stage 3 unspecified: Secondary | ICD-10-CM | POA: Insufficient documentation

## 2013-07-26 DIAGNOSIS — F1410 Cocaine abuse, uncomplicated: Secondary | ICD-10-CM | POA: Insufficient documentation

## 2013-07-26 DIAGNOSIS — E119 Type 2 diabetes mellitus without complications: Secondary | ICD-10-CM | POA: Diagnosis not present

## 2013-07-26 DIAGNOSIS — I5023 Acute on chronic systolic (congestive) heart failure: Secondary | ICD-10-CM | POA: Insufficient documentation

## 2013-07-26 DIAGNOSIS — I5022 Chronic systolic (congestive) heart failure: Secondary | ICD-10-CM

## 2013-07-26 DIAGNOSIS — I129 Hypertensive chronic kidney disease with stage 1 through stage 4 chronic kidney disease, or unspecified chronic kidney disease: Secondary | ICD-10-CM | POA: Diagnosis not present

## 2013-07-26 DIAGNOSIS — F3289 Other specified depressive episodes: Secondary | ICD-10-CM | POA: Insufficient documentation

## 2013-07-26 DIAGNOSIS — Z95810 Presence of automatic (implantable) cardiac defibrillator: Secondary | ICD-10-CM | POA: Diagnosis not present

## 2013-07-26 DIAGNOSIS — I509 Heart failure, unspecified: Secondary | ICD-10-CM | POA: Insufficient documentation

## 2013-07-26 LAB — BASIC METABOLIC PANEL
Anion gap: 18 — ABNORMAL HIGH (ref 5–15)
BUN: 60 mg/dL — ABNORMAL HIGH (ref 6–23)
CALCIUM: 9 mg/dL (ref 8.4–10.5)
CO2: 20 meq/L (ref 19–32)
Chloride: 93 mEq/L — ABNORMAL LOW (ref 96–112)
Creatinine, Ser: 2.25 mg/dL — ABNORMAL HIGH (ref 0.50–1.35)
GFR calc Af Amer: 39 mL/min — ABNORMAL LOW (ref >90)
GFR calc non Af Amer: 33 mL/min — ABNORMAL LOW (ref >90)
GLUCOSE: 242 mg/dL — AB (ref 70–99)
Potassium: 4.4 mEq/L (ref 3.7–5.3)
Sodium: 131 mEq/L — ABNORMAL LOW (ref 137–147)

## 2013-07-26 MED ORDER — POTASSIUM CHLORIDE CRYS ER 20 MEQ PO TBCR: 40.0000 meq | EXTENDED_RELEASE_TABLET | Freq: Two times a day (BID) | ORAL | Status: DC

## 2013-07-26 MED ORDER — METOLAZONE 2.5 MG PO TABS: 2.5000 mg | ORAL_TABLET | ORAL | Status: DC

## 2013-07-26 NOTE — Patient Instructions (Signed)
Follow up in 1 month    Take 2.5 mg metolazone once a week with an extra 20 meq of potassium   Do the following things EVERYDAY: 1) Weigh yourself in the morning before breakfast. Write it down and keep it in a log. 2) Take your medicines as prescribed 3) Eat low salt foods-Limit salt (sodium) to 2000 mg per day.  4) Stay as active as you can everyday 5) Limit all fluids for the day to less than 2 liters

## 2013-07-31 ENCOUNTER — Ambulatory Visit (INDEPENDENT_AMBULATORY_CARE_PROVIDER_SITE_OTHER): Payer: Medicare Other | Admitting: Internal Medicine

## 2013-07-31 ENCOUNTER — Encounter: Payer: Self-pay | Admitting: Internal Medicine

## 2013-07-31 VITALS — BP 118/79 | HR 92 | Temp 96.8°F | Ht 72.0 in | Wt 215.8 lb

## 2013-07-31 DIAGNOSIS — E1129 Type 2 diabetes mellitus with other diabetic kidney complication: Secondary | ICD-10-CM

## 2013-07-31 DIAGNOSIS — I10 Essential (primary) hypertension: Secondary | ICD-10-CM

## 2013-07-31 DIAGNOSIS — M109 Gout, unspecified: Secondary | ICD-10-CM

## 2013-07-31 LAB — GLUCOSE, CAPILLARY: Glucose-Capillary: >600 mg/dL (ref 70–99)
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
## 2013-07-31 NOTE — Patient Instructions (Signed)
Please take 20 units of Lantus daily and check your blood sugar at least 3 times a day and BRING ME YOUR METER next week!  Thank you for coming in today, please cut back on your sugary foods.   General Instructions:   Please bring your medicines with you each time you come to clinic.  Medicines may include prescription medications, over-the-counter medications, herbal remedies, eye drops, vitamins, or other pills.   Progress Toward Treatment Goals:  Treatment Goal 09/05/2012  Hemoglobin A1C unable to assess  Blood pressure at goal  Stop smoking smoking less  Prevent falls at goal    Self Care Goals & Plans:  Self Care Goal 05/10/2013  Manage my medications take my medicines as prescribed; bring my medications to every visit; refill my medications on time; follow the sick day instructions if I am sick  Monitor my health -  Eat healthy foods eat smaller portions  Be physically active find an activity I enjoy    Home Blood Glucose Monitoring 09/05/2012  Check my blood sugar 3 times a day  When to check my blood sugar before meals     Care Management & Community Referrals:  Referral 07/10/2012  Referrals made for care management support -  Referrals made to community resources noncommercial health insurance options; transportation

## 2013-08-03 NOTE — Assessment & Plan Note (Signed)
Reports improved L elbow pain, is being treated for gout by hospice. On allopurinol and prednisone 40mg  x6 days was prescribed.

## 2013-08-03 NOTE — Progress Notes (Signed)
INTERNAL MEDICINE TEACHING ATTENDING ADDENDUM - Dallis Czaja, MD: I reviewed and discussed at the time of visit with the resident Dr. Qureshi, the patient's medical history, physical examination, diagnosis and results of pertinent tests and treatment and I agree with the patient's care as documented.  

## 2013-08-03 NOTE — Assessment & Plan Note (Addendum)
Lab Results  Component Value Date   HGBA1C 13.5* 07/03/2013   HGBA1C 10.9 05/10/2013   HGBA1C 9.4 02/15/2013    Assessment: Diabetes control: poor control (HgbA1C >9%) Progress toward A1C goal:    Comments: cbg >600 today, did not take insulin. Did not bring meter.   Plan: Medications:  continue current medications lantus 20 units. Home glucose monitoring: Frequency: 3 times a day Timing: before meals Instruction/counseling given: reminded to bring blood glucose meter & log to each visit and reminded to bring medications to each visit He will need to return to clinic next week with his meter so we can properly adjust his insulin and diabetic regimen at that time. Currently reports feeling well, baseline polyuria, no polydipsia.

## 2013-08-03 NOTE — Assessment & Plan Note (Signed)
BP Readings from Last 3 Encounters:  07/31/13 118/79  07/26/13 94/70  07/12/13 99/74   Lab Results  Component Value Date   NA 131* 07/26/2013   K 4.4 07/26/2013   CREATININE 2.25* 07/26/2013   Assessment: Blood pressure control: controlled Progress toward BP goal:  at goal  Plan: Medications:  continue current medications torsemide 60mg  bid, metolazone 2.5mg  q weekly, hydralazine 25mg  q8h, dig 0.125mg  qd Educational resources provided:   Self management tools provided:   Other plans: follows closely with HF clinic

## 2013-08-07 ENCOUNTER — Encounter: Payer: Medicare Other | Admitting: Internal Medicine

## 2013-08-09 ENCOUNTER — Other Ambulatory Visit: Payer: Self-pay | Admitting: *Deleted

## 2013-08-09 MED ORDER — INSULIN SYRINGES (DISPOSABLE) U-100 1 ML MISC: Status: DC

## 2013-08-20 ENCOUNTER — Telehealth: Payer: Self-pay | Admitting: Internal Medicine

## 2013-08-20 NOTE — Telephone Encounter (Signed)
I tried calling Mr. Jett today to touch base given that he missed his last appointment with me. On our last visit he was upset due to lack of opc pcp follow up but unfortunately no showed on his next appointment in clinic for diabetes check.  There was no answer to my call today. Hopefully, he will reschedule his visit.

## 2013-08-22 ENCOUNTER — Telehealth (HOSPITAL_COMMUNITY): Payer: Self-pay | Admitting: Vascular Surgery

## 2013-08-22 NOTE — Progress Notes (Signed)
Patient ID: [name], male   DOB: Mar 14, 1965, 48 y.o.   MRN: [id number] Psychiatrist: Dr Anthony Sar PCP: Dr Loistine Chance  HPI: [name] is a 48 y/o male with HTN and DM2 with longstanding HF due to NICM (probably ETOH), EF 20%. Current cocaine abuse. Cath in 2008 showed left circumflex with 40% stenosis. Baseline weight about 210. Was previously on [facility] but this ended in December in 2013 as he was doing well.   Admitted 03/2011 with NYHA Class IV symptoms, requiring milrinone therapy and IV lasix. Cath 04/13/11: cardiogenic shock with biventricular failure (R>L) and possible restrictive physiology. RA = 22 with steep y-descents, RV = 45/12/25, PA = 51/25 (34), PCW = 18-20, Fick cardiac output/index = 3.1/1.4, Thermo CO/CI = 3.6/1.6, PVR = 4.8 [place] O2 sat = 85% (after sedation), PA sat = 31%, 36%. Milrinone was initiated for low output physiology.   Admitted to [facility] 06/2012 witih CP/elevated troponin and increased SOB. He had a +USD for cocaine. He also had LHC 07/17/12 showing subtotal thrombotic occlusion of the distal OM1. It was too small at this point for PCI and was started on plavix.  Echo showed EF 10% with no LV thrombus and moderately decreased RV systolic function.  His d/c weight was 206 and now 228 lbs, though this is lower than 230 which was his weight at last appointment in this office.  Admitted to [facility] 08/04/12 through 08/09/12 NSTEMI thought to be from non st elevation MI from cocaine induced vasospasm and volume overload. + cocaine. Dry weight 205-208 pounds. Discharge weight 208 pounds.   Admitted June 9 through June 18 with volume overload and low output heart failure.  He required lasix drip and milrinone. As he improved he was transitioned  to torsemide 60 mg twice a day. Discharge weight 206 pounds.   He returns for follow up. He remains off BB due to suspected low output. Followed by [facility]. Complains of fatigue. Denies SOB/PNR/Orthopnea. As longs as he walking  slow he is ok. Taking all medications. Says his appetite has increased. Eating out most nights. High salt foods. Says he is to tired to cook.    7/14: K 4.8, creatinine 2 08/09/12 Potassium 4.2 Creatinine 2.0 8/14: K 4, creatinine 1.6, digoxin 0.8 11/02/12: K+ 3.3, Creatinine 1.89, cholesterol 98 12/11/12 K 3.1 Creatinine 2.26 Pro BNP 2957  07/05/13 K 3.5 Creatinine 2.44  07/12/13 K 3.3 Creatinine 2.27 Pro BNP 5773  07/26/13 K 4.4 Creatinine 2.25  SH: Lives in low income apartment. 3 children, lives in [place].  + cocaine abuse until recently, says he has quit.  Quit smoking.  FH: No premature CAD.  ROS: All systems negative except as listed in HPI, PMH and Problem List.  Past Medical History  Diagnosis Date  . Hypertension   . CHF (congestive heart failure)     a. 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 b. EF 20-25%, s/p St. Jude AICD placement 04/2007. c. Echo 06/2012: EF 10%.  . History of peptic ulcer disease     EGD 09/2007 shows prox gastric ulcer with black eschar, treated with PPI  . Depression   . History of alcohol abuse     quit approx 2011-2012  . OSA (obstructive sleep apnea)     Intolerant of CPAP  . ICD (implantable cardiac defibrillator) in place   . Diabetes mellitus     insulin dependent  . GERD (gastroesophageal reflux disease)   . Headache(784.0)   .  Myasthenia gravis 10/12/2011    Diagnosed in 09/2011. Received Plasmapheresis. CT chest no Thymoma. Acetylcholine receptor antibody negative.    . CKD (chronic kidney disease) stage 3, GFR 30-59 ml/min   . Polysubstance abuse     Including cocaine (+ UDS multiple times in 2014)  . PUD (peptic ulcer disease)   . Pulmonary nodule     a. CT 09/2011.  Marland Kitchen CAD (coronary artery disease)     a. NSTEMI 06/2012: cath showed subtotal thrombotic occlusion of the distal OM1, too small for PCI, ?cocaine-triggered plaque rupture with thrombosis (no LV thrombus on f/u echo).   - NSTEMI (6/14) with subtotal  thrombotic occlusion of a distal OM1, too small for intervention.  No LV thrombus on echo.   Current Outpatient Prescriptions  Medication Sig Dispense Refill  . allopurinol (ZYLOPRIM) 100 MG tablet Take 1 tablet (100 mg total) by mouth daily.  30 tablet  6  . aspirin EC 81 MG EC tablet Take 1 tablet (81 mg total) by mouth daily.  30 tablet  6  . atorvastatin (LIPITOR) 40 MG tablet Take 80 mg by mouth daily.      . digoxin (LANOXIN) 0.125 MG tablet Take 0.125 mg by mouth daily.      Marland Kitchen gabapentin (NEURONTIN) 100 MG capsule Take 2 tablets in the morning and 2 tablets in the evening.  120 capsule  1  . hydrALAZINE (APRESOLINE) 25 MG tablet Take 1 tablet (25 mg total) by mouth every 8 (eight) hours.  90 tablet  6  . insulin glargine (LANTUS) 100 UNIT/ML injection Inject 0.2 mLs (20 Units total) into the skin at bedtime.  10 mL  11  . Insulin Syringes, Disposable, (B-D INSULIN SYRINGE 1CC) U-100 1 ML MISC Use for insulin  100 each  3  . metolazone (ZAROXOLYN) 2.5 MG tablet Take 1 tablet (2.5 mg total) by mouth once a week. Take an extra 20 meq when you take metolazone  12 tablet  6  . ondansetron (ZOFRAN) 4 MG tablet Take 1 tablet (4 mg total) by mouth every 8 (eight) hours as needed for nausea or vomiting.  20 tablet  0  . oxyCODONE (OXY IR/ROXICODONE) 5 MG immediate release tablet Take 1 tablet (5 mg total) by mouth every 8 (eight) hours as needed for severe pain.  30 tablet  0  . potassium chloride SA (K-DUR,KLOR-CON) 20 MEQ tablet Take 2 tablets (40 mEq total) by mouth 2 (two) times daily. Take an extra 20 meq of potassium when you take metolazone  120 tablet  3  . predniSONE (DELTASONE) 20 MG tablet Take 2 tablets (40 mg total) by mouth daily with breakfast.  6 tablet  0  . ranitidine (ZANTAC) 150 MG tablet Take 150 mg by mouth daily.      Marland Kitchen torsemide (DEMADEX) 20 MG tablet Take 3 tablets (60 mg total) by mouth 2 (two) times daily.  180 tablet  3  . [DISCONTINUED] metoprolol tartrate (LOPRESSOR) 25  MG tablet Take 1 tablet (25 mg total) by mouth 2 (two) times daily.  60 tablet  1  . [DISCONTINUED] traZODone (DESYREL) 25 mg TABS Take 0.5 tablets (25 mg total) by mouth at bedtime.  30 tablet  1   No current facility-administered medications for this encounter.   PHYSICAL EXAM: Filed Vitals:   08/23/13 1130  BP: 108/82  Pulse: 116  Weight: 226 lb 3.2 oz (102.604 kg)  SpO2: 99%   General:  Chronically ill appearing.  No resp difficulty;  HEENT: normal Neck: supple. JVP  jaw. Carotids 2+ bilaterally; no bruits. No lymphadenopathy or thryomegaly appreciated. Cor: PMI normal. Regular rate & rhythm. 2/6 systolic murmur LLSB. + S3  Lungs: clear Abdomen: obese, soft, nontender, mildly distended. No hepatosplenomegaly. No bruits or masses. Good bowel sounds. Extremities: no cyanosis, clubbing, rash.  R and LLE trace-1+ ankle edema. Neuro: alert & orientedx3, cranial nerves grossly intact. Moves all 4 extremities w/o difficulty. Affect pleasant.when  ASSESSMENT & PLAN: 1. CHF: Chronic systolic CHF, nonischemic cardiomyopathy.  Possibly related to cocaine abuse and prior ETOH abuse. ICD remains on. NYHA IIIB with funcitonal improvement.  Volume status elevated. I have asked him metolazone for 2 days and continue torsemide 60 mg twice a day and metolazone 2.5 mg once a week.  Continue 40 meq potassium twice a day.  Keep off BB. Due to ongoing cocaine abuse.    -Has ICD. Discussed turning it off as he is a [facility] patient however he would like it remain on.   - Continue digoxin 0.125 mg, hydralazine 25 mg TID and imdur 30 mg daily.   Will not up titrate due to soft BP.  - No ACEI /spiro with CKD   - Reinforced low salt food choices, limiting fluid intake and medication compliance.  [facility] . I have notified them of todays visit.   2. Cocaine abuse: Last admit + for cocaine. Encouraged to remain off cocaine.  3. CKD, stage III: Not on ACE-I or Spiro.   Follow up  in 1 month   Fauna Neuner NP-C 11:34 AM

## 2013-08-22 NOTE — Telephone Encounter (Signed)
Pt is scheduled with Tonye Becket, NP tomorrow at 11:15, mess sent to her will address at [facility]

## 2013-08-22 NOTE — Telephone Encounter (Signed)
FYI... Nurse called to give update.. Pt has gained 10lbs and he is eating anything he wants. Pt will be seen in the clinic tomorrow.Marland KitchenMarland Kitchen

## 2013-08-23 ENCOUNTER — Ambulatory Visit (HOSPITAL_COMMUNITY)
Admission: RE | Admit: 2013-08-23 | Discharge: 2013-08-23 | Disposition: A | Discharge status: Home / Self Care | Source: Ambulatory Visit | Attending: Cardiology | Admitting: Cardiology

## 2013-08-23 ENCOUNTER — Encounter (HOSPITAL_COMMUNITY): Payer: Self-pay

## 2013-08-23 VITALS — BP 108/82 | HR 116 | Wt 226.2 lb

## 2013-08-23 DIAGNOSIS — I428 Other cardiomyopathies: Secondary | ICD-10-CM | POA: Insufficient documentation

## 2013-08-23 DIAGNOSIS — K219 Gastro-esophageal reflux disease without esophagitis: Secondary | ICD-10-CM | POA: Insufficient documentation

## 2013-08-23 DIAGNOSIS — Z95810 Presence of automatic (implantable) cardiac defibrillator: Secondary | ICD-10-CM | POA: Insufficient documentation

## 2013-08-23 DIAGNOSIS — F1410 Cocaine abuse, uncomplicated: Secondary | ICD-10-CM | POA: Insufficient documentation

## 2013-08-23 DIAGNOSIS — I129 Hypertensive chronic kidney disease with stage 1 through stage 4 chronic kidney disease, or unspecified chronic kidney disease: Secondary | ICD-10-CM | POA: Insufficient documentation

## 2013-08-23 DIAGNOSIS — F1910 Other psychoactive substance abuse, uncomplicated: Secondary | ICD-10-CM | POA: Diagnosis not present

## 2013-08-23 DIAGNOSIS — Z7982 Long term (current) use of aspirin: Secondary | ICD-10-CM | POA: Diagnosis not present

## 2013-08-23 DIAGNOSIS — I5022 Chronic systolic (congestive) heart failure: Secondary | ICD-10-CM | POA: Diagnosis not present

## 2013-08-23 DIAGNOSIS — E119 Type 2 diabetes mellitus without complications: Secondary | ICD-10-CM | POA: Insufficient documentation

## 2013-08-23 DIAGNOSIS — N183 Chronic kidney disease, stage 3 unspecified: Secondary | ICD-10-CM | POA: Diagnosis not present

## 2013-08-23 DIAGNOSIS — I2510 Atherosclerotic heart disease of native coronary artery without angina pectoris: Secondary | ICD-10-CM | POA: Insufficient documentation

## 2013-08-23 DIAGNOSIS — G4733 Obstructive sleep apnea (adult) (pediatric): Secondary | ICD-10-CM | POA: Diagnosis not present

## 2013-08-23 DIAGNOSIS — Z8711 Personal history of peptic ulcer disease: Secondary | ICD-10-CM | POA: Insufficient documentation

## 2013-08-23 DIAGNOSIS — I509 Heart failure, unspecified: Secondary | ICD-10-CM | POA: Diagnosis not present

## 2013-08-23 DIAGNOSIS — Z794 Long term (current) use of insulin: Secondary | ICD-10-CM | POA: Insufficient documentation

## 2013-08-23 DIAGNOSIS — G7000 Myasthenia gravis without (acute) exacerbation: Secondary | ICD-10-CM | POA: Insufficient documentation

## 2013-08-23 MED ORDER — METOLAZONE 2.5 MG PO TABS: 2.5000 mg | ORAL_TABLET | ORAL | Status: DC

## 2013-08-23 NOTE — Patient Instructions (Signed)
Follow up in 4 weeks  Take metolazone 2.5 mg today and tomorrow  Take an extra 20 meq of potassium today and tomorrow  Do the following things EVERYDAY: 1) Weigh yourself in the morning before breakfast. Write it down and keep it in a log. 2) Take your medicines as prescribed 3) Eat low salt foods-Limit salt (sodium) to 2000 mg per day.  4) Stay as active as you can everyday 5) Limit all fluids for the day to less than 2 liters

## 2013-09-08 ENCOUNTER — Encounter (HOSPITAL_COMMUNITY): Payer: Self-pay | Admitting: Emergency Medicine

## 2013-09-08 DIAGNOSIS — I129 Hypertensive chronic kidney disease with stage 1 through stage 4 chronic kidney disease, or unspecified chronic kidney disease: Secondary | ICD-10-CM | POA: Insufficient documentation

## 2013-09-08 DIAGNOSIS — R5381 Other malaise: Secondary | ICD-10-CM | POA: Insufficient documentation

## 2013-09-08 DIAGNOSIS — IMO0002 Reserved for concepts with insufficient information to code with codable children: Secondary | ICD-10-CM | POA: Insufficient documentation

## 2013-09-08 DIAGNOSIS — Z8669 Personal history of other diseases of the nervous system and sense organs: Secondary | ICD-10-CM | POA: Diagnosis not present

## 2013-09-08 DIAGNOSIS — K279 Peptic ulcer, site unspecified, unspecified as acute or chronic, without hemorrhage or perforation: Secondary | ICD-10-CM | POA: Diagnosis not present

## 2013-09-08 DIAGNOSIS — I509 Heart failure, unspecified: Secondary | ICD-10-CM | POA: Diagnosis not present

## 2013-09-08 DIAGNOSIS — N183 Chronic kidney disease, stage 3 unspecified: Secondary | ICD-10-CM | POA: Insufficient documentation

## 2013-09-08 DIAGNOSIS — Z95810 Presence of automatic (implantable) cardiac defibrillator: Secondary | ICD-10-CM | POA: Insufficient documentation

## 2013-09-08 DIAGNOSIS — K219 Gastro-esophageal reflux disease without esophagitis: Secondary | ICD-10-CM | POA: Insufficient documentation

## 2013-09-08 DIAGNOSIS — Z794 Long term (current) use of insulin: Secondary | ICD-10-CM | POA: Insufficient documentation

## 2013-09-08 DIAGNOSIS — I2510 Atherosclerotic heart disease of native coronary artery without angina pectoris: Secondary | ICD-10-CM | POA: Diagnosis not present

## 2013-09-08 DIAGNOSIS — Z7982 Long term (current) use of aspirin: Secondary | ICD-10-CM | POA: Diagnosis not present

## 2013-09-08 DIAGNOSIS — R5383 Other fatigue: Secondary | ICD-10-CM | POA: Diagnosis not present

## 2013-09-08 DIAGNOSIS — Z79899 Other long term (current) drug therapy: Secondary | ICD-10-CM | POA: Insufficient documentation

## 2013-09-08 DIAGNOSIS — Z87891 Personal history of nicotine dependence: Secondary | ICD-10-CM | POA: Diagnosis not present

## 2013-09-08 DIAGNOSIS — E119 Type 2 diabetes mellitus without complications: Secondary | ICD-10-CM | POA: Insufficient documentation

## 2013-09-08 DIAGNOSIS — F329 Major depressive disorder, single episode, unspecified: Secondary | ICD-10-CM | POA: Diagnosis not present

## 2013-09-08 DIAGNOSIS — F3289 Other specified depressive episodes: Secondary | ICD-10-CM | POA: Insufficient documentation

## 2013-09-08 LAB — COMPREHENSIVE METABOLIC PANEL
ALK PHOS: 84 U/L (ref 39–117)
ALT: 7 U/L (ref 0–53)
AST: 19 U/L (ref 0–37)
Albumin: 3.2 g/dL — ABNORMAL LOW (ref 3.5–5.2)
Anion gap: 15 (ref 5–15)
BUN: 38 mg/dL — ABNORMAL HIGH (ref 6–23)
CO2: 26 meq/L (ref 19–32)
Calcium: 9.4 mg/dL (ref 8.4–10.5)
Chloride: 92 mEq/L — ABNORMAL LOW (ref 96–112)
Creatinine, Ser: 2.11 mg/dL — ABNORMAL HIGH (ref 0.50–1.35)
GFR, EST AFRICAN AMERICAN: 42 mL/min — AB (ref >90)
GFR, EST NON AFRICAN AMERICAN: 36 mL/min — AB (ref >90)
GLUCOSE: 192 mg/dL — AB (ref 70–99)
POTASSIUM: 4.3 meq/L (ref 3.7–5.3)
Sodium: 133 mEq/L — ABNORMAL LOW (ref 137–147)
Total Bilirubin: 2.7 mg/dL — ABNORMAL HIGH (ref 0.3–1.2)
Total Protein: 7.2 g/dL (ref 6.0–8.3)

## 2013-09-08 LAB — CBC WITH DIFFERENTIAL/PLATELET
Basophils Absolute: 0 10*3/uL (ref 0.0–0.1)
Basophils Relative: 1 % (ref 0–1)
Eosinophils Absolute: 0.2 10*3/uL (ref 0.0–0.7)
Eosinophils Relative: 5 % (ref 0–5)
HCT: 45 % (ref 39.0–52.0)
HEMOGLOBIN: 14.8 g/dL (ref 13.0–17.0)
LYMPHS ABS: 0.9 10*3/uL (ref 0.7–4.0)
Lymphocytes Relative: 25 % (ref 12–46)
MCH: 27.3 pg (ref 26.0–34.0)
MCHC: 32.9 g/dL (ref 30.0–36.0)
MCV: 82.9 fL (ref 78.0–100.0)
MONOS PCT: 6 % (ref 3–12)
Monocytes Absolute: 0.2 10*3/uL (ref 0.1–1.0)
NEUTROS PCT: 63 % (ref 43–77)
Neutro Abs: 2.5 10*3/uL (ref 1.7–7.7)
Platelets: 185 10*3/uL (ref 150–400)
RBC: 5.43 MIL/uL (ref 4.22–5.81)
RDW: 17 % — ABNORMAL HIGH (ref 11.5–15.5)
WBC: 3.8 10*3/uL — ABNORMAL LOW (ref 4.0–10.5)

## 2013-09-08 LAB — PRO B NATRIURETIC PEPTIDE: Pro B Natriuretic peptide (BNP): 6632 pg/mL — ABNORMAL HIGH (ref 0–125)

## 2013-09-08 LAB — TROPONIN I: Troponin I: <0.3 ng/mL (ref <0.30)

## 2013-09-08 NOTE — ED Notes (Signed)
Patient now c/o chest pain.

## 2013-09-08 NOTE — ED Notes (Signed)
Patient presents via EMS with c/o feeling weak, tired and retaining fluid

## 2013-09-08 NOTE — ED Notes (Signed)
Patient presents via EMS with c/o generalized weakness.  EMS reports he comes here frequently to "have fluid drawn off"  EMS BP 116/82 CBG 190  History of heart failure and renal failure

## 2013-09-09 ENCOUNTER — Emergency Department (HOSPITAL_COMMUNITY)
Admission: EM | Admit: 2013-09-09 | Discharge: 2013-09-09 | Disposition: A | Discharge status: Home / Self Care | Attending: Emergency Medicine | Admitting: Emergency Medicine

## 2013-09-09 DIAGNOSIS — R5381 Other malaise: Secondary | ICD-10-CM

## 2013-09-09 LAB — URINALYSIS, ROUTINE W REFLEX MICROSCOPIC
GLUCOSE, UA: NEGATIVE mg/dL
Ketones, ur: NEGATIVE mg/dL
Leukocytes, UA: NEGATIVE
Nitrite: NEGATIVE
PH: 5 (ref 5.0–8.0)
Protein, ur: >300 mg/dL — AB
SPECIFIC GRAVITY, URINE: 1.016 (ref 1.005–1.030)
Urobilinogen, UA: 1 mg/dL (ref 0.0–1.0)

## 2013-09-09 LAB — I-STAT CG4 LACTIC ACID, ED: Lactic Acid, Venous: 1.56 mmol/L (ref 0.5–2.2)

## 2013-09-09 LAB — URINE MICROSCOPIC-ADD ON

## 2013-09-09 LAB — DIGOXIN LEVEL: Digoxin Level: <0.3 ng/mL — ABNORMAL LOW (ref 0.8–2.0)

## 2013-09-09 LAB — LIPASE, BLOOD: LIPASE: 34 U/L (ref 11–59)

## 2013-09-09 MED ORDER — ONDANSETRON HCL 4 MG/2ML IJ SOLN
4.0000 mg | Freq: Once | INTRAMUSCULAR | Status: AC
  Administered 2013-09-09: 4 mg via INTRAVENOUS
  Filled 2013-09-09: qty 2

## 2013-09-09 MED ORDER — MORPHINE SULFATE 4 MG/ML IJ SOLN
4.0000 mg | Freq: Once | INTRAMUSCULAR | Status: AC
  Administered 2013-09-09: 4 mg via INTRAVENOUS
  Filled 2013-09-09: qty 1

## 2013-09-09 NOTE — ED Provider Notes (Signed)
CSN: [id number]     Arrival date & time 09/08/13  2155 History   First MD Initiated Contact with Patient 09/09/13 [phone]     Chief Complaint  Patient presents with  . Fatigue     (Consider location/radiation/quality/duration/timing/severity/associated sxs/prior Treatment) HPI  Patient is a 48 yo man with extensive medical problems including dilated cardiomyopathy, PUD, OSA, IDDM, GERD, myasthenia gravis, polysubstance abuse and CAD.   He comes in today with complaints of abdominal bloating and discomfort - diffuse. The patient reports three episodes of emesis today. He says that the vomitus was pink and had clots of blood. Patient says his whole body hurts. He feels fatigued.  No FEVER.  Normal po intake. No cp, abd pain, sob.  Past Medical History  Diagnosis Date  . Hypertension   . CHF (congestive heart failure)     a. 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 b. EF 20-25%, s/p [employer] AICD placement 04/2007. c. Echo 06/2012: EF 10%.  . History of peptic ulcer disease     EGD 09/2007 shows prox gastric ulcer with black eschar, treated with PPI  . Depression   . History of alcohol abuse     quit approx 2011-2012  . OSA (obstructive sleep apnea)     Intolerant of CPAP  . ICD (implantable cardiac defibrillator) in place   . Diabetes mellitus     insulin dependent  . GERD (gastroesophageal reflux disease)   . Headache(784.0)   . Myasthenia gravis 10/12/2011    Diagnosed in 09/2011. Received Plasmapheresis. CT chest no Thymoma. Acetylcholine receptor antibody negative.    . CKD (chronic kidney disease) stage 3, GFR 30-59 ml/min   . Polysubstance abuse     Including cocaine (+ UDS multiple times in 2014)  . PUD (peptic ulcer disease)   . Pulmonary nodule     a. CT 09/2011.  Marland Kitchen CAD (coronary artery disease)     a. NSTEMI 06/2012: cath showed subtotal thrombotic occlusion of the distal OM1, too small for PCI, ?cocaine-triggered plaque rupture with thrombosis (no LV  thrombus on f/u echo).    Past Surgical History  Procedure Laterality Date  . Cardiac defibrillator placement  04/2007  . Knee surgery    . Skin grafts    . Facial reconstruction surgery    . Insertion of dialysis catheter  10/12/2011    temporary    Family History  Problem Relation Age of Onset  . Heart failure Mother   . Hypertension Mother   . Sarcoidosis Mother   . Diabetes type II Mother   . Heart attack Brother    History  Substance Use Topics  . Smoking status: Former Smoker    Types: Cigars    Quit date: 07/27/2007  . Smokeless tobacco: Former Neurosurgeon     Comment: Quit x 1 month.  . Alcohol Use: No     Comment: no alcohol since 2012    Review of Systems 10 point review of symptoms obtained and is negative with the exceptions of symptoms noted abov.e    Allergies  Shellfish allergy; Tramadol; Adhesive; Tylenol; and Zoloft  Home Medications   Prior to Admission medications   Medication Sig Start Date End Date Taking? Authorizing Provider  allopurinol (ZYLOPRIM) 100 MG tablet Take 1 tablet (100 mg total) by mouth daily. 07/05/13   Amy D Filbert Schilder, NP  aspirin EC 81 MG EC tablet Take 1 tablet (81 mg total) by mouth daily. 07/05/13   Amy D Clegg,  NP  atorvastatin (LIPITOR) 40 MG tablet Take 80 mg by mouth daily.    Historical Provider, MD  digoxin (LANOXIN) 0.125 MG tablet Take 0.125 mg by mouth daily.    Historical Provider, MD  gabapentin (NEURONTIN) 100 MG capsule Take 2 tablets in the morning and 2 tablets in the evening. 06/19/13   Cathlean Cower, MD  hydrALAZINE (APRESOLINE) 25 MG tablet Take 1 tablet (25 mg total) by mouth every 8 (eight) hours. 07/05/13   Amy D Clegg, NP  insulin glargine (LANTUS) 100 UNIT/ML injection Inject 0.2 mLs (20 Units total) into the skin at bedtime. 07/05/13   Amy D Filbert Schilder, NP  Insulin Syringes, Disposable, (B-D INSULIN SYRINGE 1CC) U-100 1 ML MISC Use for insulin 08/09/13   Baltazar Apo, MD  metolazone (ZAROXOLYN) 2.5 MG tablet Take 1  tablet (2.5 mg total) by mouth once a week. Take an extra 20 meq when you take metolazone 08/23/13   Amy D Clegg, NP  ondansetron (ZOFRAN) 4 MG tablet Take 1 tablet (4 mg total) by mouth every 8 (eight) hours as needed for nausea or vomiting. 07/05/13   Amy D Clegg, NP  oxyCODONE (OXY IR/ROXICODONE) 5 MG immediate release tablet Take 1 tablet (5 mg total) by mouth every 8 (eight) hours as needed for severe pain. 07/05/13   Amy D Clegg, NP  potassium chloride SA (K-DUR,KLOR-CON) 20 MEQ tablet Take 60 mEq by mouth 2 (two) times daily. Take an extra 20 meq of potassium when you take metolazone 07/26/13   Amy D Clegg, NP  predniSONE (DELTASONE) 20 MG tablet Take 2 tablets (40 mg total) by mouth daily with breakfast. 07/24/13   Dolores Patty, MD  ranitidine (ZANTAC) 150 MG tablet Take 150 mg by mouth daily.    Historical Provider, MD  torsemide (DEMADEX) 20 MG tablet Take 3 tablets (60 mg total) by mouth 2 (two) times daily. 07/05/13   Amy D Clegg, NP   BP 105/77  Pulse 100  Temp(Src) 98.2 F (36.8 C) (Oral)  Resp 20  Ht 6' (1.829 m)  Wt 226 lb (102.513 kg)  BMI 30.64 kg/m2  SpO2 97% Physical Exam Gen: well nourished and well developed appearing Head: NCAT Ears: normal to inspection Nose: normal to inspection, no epistaxis or drainage Mouth: oral mucsoa is well hydrated appearing, normal posterior oropharynx Neck: supple, no stridor CV: RRR, no murmur, palpable peripheral pulses Resp: lung sounds are clear to auscultation bilaterally, no wheeing or rhonchi or rales, normal respiratory effort.  Abd: soft, nontender, nondistended Extremities: normal to inspection.  Skin: warm and dry Neuro: CN ii - XII, no focal deficitis Psyche; normal affect, cooperative.   ED Course  Procedures (including critical care time) Labs Review  Results for orders placed during the hospital encounter of 09/09/13 (from the past 24 hour(s))  CBC WITH DIFFERENTIAL     Status: Abnormal   Collection Time     09/08/13 10:23 PM      Result Value Ref Range   WBC 3.8 (*) 4.0 - 10.5 K/uL   RBC 5.43  4.22 - 5.81 MIL/uL   Hemoglobin 14.8  13.0 - 17.0 g/dL   HCT 45.4  09.8 - 11.9 %   MCV 82.9  78.0 - 100.0 fL   MCH 27.3  26.0 - 34.0 pg   MCHC 32.9  30.0 - 36.0 g/dL   RDW 14.7 (*) 82.9 - 56.2 %   Platelets 185  150 - 400 K/uL   Neutrophils Relative %  63  43 - 77 %   Neutro Abs 2.5  1.7 - 7.7 K/uL   Lymphocytes Relative 25  12 - 46 %   Lymphs Abs 0.9  0.7 - 4.0 K/uL   Monocytes Relative 6  3 - 12 %   Monocytes Absolute 0.2  0.1 - 1.0 K/uL   Eosinophils Relative 5  0 - 5 %   Eosinophils Absolute 0.2  0.0 - 0.7 K/uL   Basophils Relative 1  0 - 1 %   Basophils Absolute 0.0  0.0 - 0.1 K/uL  COMPREHENSIVE METABOLIC PANEL     Status: Abnormal   Collection Time    09/08/13 10:23 PM      Result Value Ref Range   Sodium 133 (*) 137 - 147 mEq/L   Potassium 4.3  3.7 - 5.3 mEq/L   Chloride 92 (*) 96 - 112 mEq/L   CO2 26  19 - 32 mEq/L   Glucose, Bld 192 (*) 70 - 99 mg/dL   BUN 38 (*) 6 - 23 mg/dL   Creatinine, Ser 4.65 (*) 0.50 - 1.35 mg/dL   Calcium 9.4  8.4 - 03.5 mg/dL   Total Protein 7.2  6.0 - 8.3 g/dL   Albumin 3.2 (*) 3.5 - 5.2 g/dL   AST 19  0 - 37 U/L   ALT 7  0 - 53 U/L   Alkaline Phosphatase 84  39 - 117 U/L   Total Bilirubin 2.7 (*) 0.3 - 1.2 mg/dL   GFR calc non Af Amer 36 (*) >90 mL/min   GFR calc Af Amer 42 (*) >90 mL/min   Anion gap 15  5 - 15  PRO B NATRIURETIC PEPTIDE     Status: Abnormal   Collection Time    09/08/13 10:24 PM      Result Value Ref Range   Pro B Natriuretic peptide (BNP) 6632.0 (*) 0 - 125 pg/mL  TROPONIN I     Status: None   Collection Time    09/08/13 10:24 PM      Result Value Ref Range   Troponin I <0.30  <0.30 ng/mL    EKG:  NSR, right axis deviation, normal QRS, no acute ischemic changes.   MDM   DDX: PUD, gastritis, colitis, IBD, UTI, volume overload, mesenteric ischemia.   Patient states that he is a Hospice patient. 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 He typically takes oxycodone 10mg  po as needed. We are awaiting results of CXR, lactic acid level and U/A. Thus far, work up is nondiagnostic and notable for stable CKD with BNP at baseline elevated range and mild leukopenia.   4656: ED work up is non-diagnostic. The patient continues to have normal VS. Resting comfortably. No indication for further urgent work up. Patient is stable to for discharge with plan for close ouatpeitn f/u with primary care provider.     Brandt Loosen, MD 09/09/13 [id number]

## 2013-09-09 NOTE — ED Notes (Signed)
Pt refused collection of a rectal stool sample. This RN informed the pt that the sample was to test for blood in his stool, and that if he could provide a sample that it could be used. Pt also advised that he needs to give a urine sample.

## 2013-09-09 NOTE — ED Notes (Signed)
This RN called a cab for the pt.  

## 2013-09-09 NOTE — ED Notes (Signed)
Pt is still refusing to give a stool sample. Lavella Lemons, MD is aware.

## 2013-09-09 NOTE — ED Notes (Addendum)
While this RN was reading the pt his discharge instructions, the pt had a 7 beat run of v-tach. Lavella Lemons, MD is aware, and will let the pt be discharged. Pt has an ICD, and a hx of CHF and is on hospice due to heart failure.

## 2013-09-09 NOTE — Discharge Instructions (Signed)
Deconditioning Deconditioning refers to the changes in your body that occur during a period of inactivity. Deconditioning results in changes to your heart, lungs, and muscles. These changes decrease your ability to endure activity, resulting in feelings of fatigue and weakness. Deconditioning can occur after only a few days of bed rest or inactivity. The longer the period of inactivity, the more severe your symptoms of deconditioning will be. After longer periods of inactivity, it will also take longer for you to return to your previous level of functioning. Deconditioning can be mild, moderate, or severe:  Mild. Your condition interferes with your ability to perform your usual types of exercise, such as running, biking, or swimming.  Moderate. Your condition interferes with your ability to do normal everyday activities. This may include walking, grocery shopping, or doing chores or lawn work.  Severe. Your condition interferes with your ability to perform minimal activity or normal self-care. CAUSES  Some common reasons for inactivity that may result in deconditioning include:  Illnesses, such as cancer, stroke, heart attack, fibromyalgia, or chronic fatigue syndrome.  Injuries, especially back injuries, broken bones, or injury to ligaments or tendons.  Surgery or a long stay in the hospital for any reason.  Pregnancy, especially with conditions that require long periods of bed rest. RISK FACTORS Anything that results in a period of hospitalization or bed rest will put you at risk of deconditioning. Some other factors that can increase the risk include:  Obesity.  Poor nutrition.  Old age.  Injuries or illnesses that interfere with movement and activity. SIGNS AND SYMPTOMS  Feeling weak.  Feeling tired.  Shortness of breath with minor exertion.  Your heart beating faster than normal. You may or may not notice this without taking your pulse.  Pain or discomfort with  activity.  Decreased strength.  Decreased sense of balance.  Decreased endurance.  Difficulty participating in usual forms of exercise.  Difficulty doing activities of daily living, such as grocery shopping or chores.  Difficulty walking around the house and doing basic self-care, such as getting to the bathroom, preparing meals, or doing laundry. DIAGNOSIS  There is no specific test to diagnose deconditioning. Your health care provider will take your medical history and do a physical exam. During the physical exam, the health care provider will check for signs of deconditioning, such as:  Decreased size of muscles.  Decreased strength.  Difficulty with balance.  Shortness of breath or abnormally increased heart rate after minor exertion. TREATMENT  Treatment usually involves a structured exercise program in which activity is increased gradually. Your health care provider will determine which exercises are right for you. The exercise program will likely include aerobic exercise and strength training. Aerobic exercise helps improve the functioning of the heart and lungs as well as the muscles. Strength training helps improve muscle size and strength. Both of these types of exercise will improve your endurance. You may be referred to a physical therapist who can create a safe strengthening program for you to follow. HOME CARE INSTRUCTIONS  Follow the exercise program recommended by your health care provider or physical therapist.  Do not increase your exercise any faster than directed.  Eat a healthy diet.  If your health care provider thinks that you need to lose weight, consider seeing a dietitian to help you do so in a healthy way.  Do not use any tobacco products, including cigarettes, chewing tobacco, or electronic cigarettes. If you need help quitting, ask your health care provider.  Take  medicines only as directed by your health care provider.  Keep all follow-up visits as  directed by your health care provider. This is important. SEEK MEDICAL CARE IF:  You are not able to carry out the prescribed exercise program.  You are not able to carry out your usual level of activity.  You are having trouble doing normal household chores or caring for yourself.  You are becoming increasingly fatigued and weak.  You become light-headed when rising to a sitting or standing position.  Your level of endurance decreases after having improved. SEEK IMMEDIATE MEDICAL CARE IF:  You have chest pain.  You are very short of breath.  You have any episodes of passing out. Document Released: 05/21/2013 Document Reviewed: 01/09/2013 [facility] Patient Information 2015 [place], [place]. This information is not intended to replace advice given to you by your health care provider. Make sure you discuss any questions you have with your health care provider.  Fatigue Fatigue is a feeling of tiredness, lack of energy, lack of motivation, or feeling tired all the time. Having enough rest, good nutrition, and reducing stress will normally reduce fatigue. Consult your caregiver if it persists. The nature of your fatigue will help your caregiver to find out its cause. The treatment is based on the cause.  CAUSES  There are many causes for fatigue. Most of the time, fatigue can be traced to one or more of your habits or routines. Most causes fit into one or more of three general areas. They are: Lifestyle problems  Sleep disturbances.  Overwork.  Physical exertion.  Unhealthy habits.  Poor eating habits or eating disorders.  Alcohol and/or drug use .  Lack of proper nutrition (malnutrition). Psychological problems  Stress and/or anxiety problems.  Depression.  Grief.  Boredom. Medical Problems or Conditions  Anemia.  Pregnancy.  Thyroid gland problems.  Recovery from major surgery.  Continuous pain.  Emphysema or asthma that is not well controlled  Allergic  conditions.  Diabetes.  Infections (such as mononucleosis).  Obesity.  Sleep disorders, such as sleep apnea.  Heart failure or other heart-related problems.  Cancer.  Kidney disease.  Liver disease.  Effects of certain medicines such as antihistamines, cough and cold remedies, prescription pain medicines, heart and blood pressure medicines, drugs used for treatment of cancer, and some antidepressants. SYMPTOMS  The symptoms of fatigue include:   Lack of energy.  Lack of drive (motivation).  Drowsiness.  Feeling of indifference to the surroundings. DIAGNOSIS  The details of how you feel help guide your caregiver in finding out what is causing the fatigue. You will be asked about your present and past health condition. It is important to review all medicines that you take, including prescription and non-prescription items. A thorough exam will be done. You will be questioned about your feelings, habits, and normal lifestyle. Your caregiver may suggest blood tests, urine tests, or other tests to look for common medical causes of fatigue.  TREATMENT  Fatigue is treated by correcting the underlying cause. For example, if you have continuous pain or depression, treating these causes will improve how you feel. Similarly, adjusting the dose of certain medicines will help in reducing fatigue.  HOME CARE INSTRUCTIONS   Try to get the required amount of good sleep every night.  Eat a healthy and nutritious diet, and drink enough water throughout the day.  Practice ways of relaxing (including yoga or meditation).  Exercise regularly.  Make plans to change situations that cause stress. Act on  those plans so that stresses decrease over time. Keep your work and personal routine reasonable.  Avoid street drugs and minimize use of alcohol.  Start taking a daily multivitamin after consulting your caregiver. SEEK MEDICAL CARE IF:   You have persistent tiredness, which cannot be  accounted for.  You have fever.  You have unintentional weight loss.  You have headaches.  You have disturbed sleep throughout the night.  You are feeling sad.  You have constipation.  You have dry skin.  You have gained weight.  You are taking any new or different medicines that you suspect are causing fatigue.  You are unable to sleep at night.  You develop any unusual swelling of your legs or other parts of your body. SEEK IMMEDIATE MEDICAL CARE IF:   You are feeling confused.  Your vision is blurred.  You feel faint or pass out.  You develop severe headache.  You develop severe abdominal, pelvic, or back pain.  You develop chest pain, shortness of breath, or an irregular or fast heartbeat.  You are unable to pass a normal amount of urine.  You develop abnormal bleeding such as bleeding from the rectum or you vomit blood.  You have thoughts about harming yourself or committing suicide.  You are worried that you might harm someone else. MAKE SURE YOU:   Understand these instructions.  Will watch your condition.  Will get help right away if you are not doing well or get worse. Document Released: 11/01/2006 Document Revised: 03/29/2011 Document Reviewed: 05/08/2013 [facility] Patient Information 2015 [place]. This information is not intended to replace advice given to you by your health care provider. Make sure you discuss any questions you have with your health care provider.

## 2013-09-09 NOTE — ED Notes (Addendum)
Pt called this RN to his room, stating that he will allow this RN to collect a rectal stool sample. The pt allowed this RN to use a rectal thermometer probe to collect a sample. This RN did not observe any visibile stool on the probe.

## 2013-09-10 LAB — POC URINE PREG, ED: Preg Test, Ur: NEGATIVE

## 2013-09-11 ENCOUNTER — Ambulatory Visit (INDEPENDENT_AMBULATORY_CARE_PROVIDER_SITE_OTHER): Admitting: Internal Medicine

## 2013-09-11 ENCOUNTER — Telehealth: Payer: Self-pay | Admitting: *Deleted

## 2013-09-11 ENCOUNTER — Encounter: Payer: Self-pay | Admitting: Internal Medicine

## 2013-09-11 VITALS — BP 109/79 | HR 97 | Ht 72.0 in | Wt 224.2 lb

## 2013-09-11 DIAGNOSIS — R112 Nausea with vomiting, unspecified: Secondary | ICD-10-CM

## 2013-09-11 DIAGNOSIS — R197 Diarrhea, unspecified: Principal | ICD-10-CM

## 2013-09-11 NOTE — Telephone Encounter (Signed)
Call from [facility] with Hospice - # [phone]  Pt seen in ED on Saturday and lab work was done.  Nurse spoke with Dr Bensimhon's office and wanted [place] to f/u on Urinalysis results. Nurse is asking for Hospice orders. Pt is having loose stools, weak, weight loss.  Unable to keep meds down. Zofran is not helping. No fever, BP 90/60,  pulse 104   Temp 95.8 Sats 96%  And cbg 203 Normal bp 110 systolic but will be low at times.    We have an open slot today at 3:15.

## 2013-09-11 NOTE — Telephone Encounter (Signed)
Talked with Hospice nurse and told her pt is scheduled for 3:15 today.   She will work on getting care for his children so he can come into clinic. I told her that Dr Meredith Pel wants pt evaluated today so if he can not come to clinic we advise ED for evaluation.

## 2013-09-11 NOTE — Telephone Encounter (Signed)
Patient should be seen and evaluated in clinic today.

## 2013-09-11 NOTE — Patient Instructions (Signed)
Thank you for your visit today.   Please return to the internal medicine clinic in 1 week      Your current medical regimen is effective;  continue present plan and take all medications as prescribed.    Please be sure to bring all of your medications with you to every visit; this includes herbal supplements, vitamins, eye drops, and any over-the-counter medications.   Should you have any questions regarding your medications and/or any new or worsening symptoms, please be sure to call the clinic at [phone].   If you believe that you are suffering from a life threatening condition or one that may result in the loss of limb or function, then you should call 911 or proceed to the nearest Emergency Department.   Viral Gastroenteritis Viral gastroenteritis is also known as stomach flu. This condition affects the stomach and intestinal tract. It can cause sudden diarrhea and vomiting. The illness typically lasts 3 to 8 days. Most people develop an immune response that eventually gets rid of the virus. While this natural response develops, the virus can make you quite ill. CAUSES  Many different viruses can cause gastroenteritis, such as rotavirus or noroviruses. You can catch one of these viruses by consuming contaminated food or water. You may also catch a virus by sharing utensils or other personal items with an infected person or by touching a contaminated surface. SYMPTOMS  The most common symptoms are diarrhea and vomiting. These problems can cause a severe loss of body fluids (dehydration) and a body salt (electrolyte) imbalance. Other symptoms may include:  Fever.  Headache.  Fatigue.  Abdominal pain. DIAGNOSIS  Your caregiver can usually diagnose viral gastroenteritis based on your symptoms and a physical exam. A stool sample may also be taken to test for the presence of viruses or other infections. TREATMENT  This illness typically goes away on its own. Treatments are aimed at  rehydration. The most serious cases of viral gastroenteritis involve vomiting so severely that you are not able to keep fluids down. In these cases, fluids must be given through an intravenous line (IV). HOME CARE INSTRUCTIONS   Drink enough fluids to keep your urine clear or pale yellow. Drink small amounts of fluids frequently and increase the amounts as tolerated.  Ask your caregiver for specific rehydration instructions.  Avoid:  Foods high in sugar.  Alcohol.  Carbonated drinks.  Tobacco.  Juice.  Caffeine drinks.  Extremely hot or cold fluids.  Fatty, greasy foods.  Too much intake of anything at one time.  Dairy products until 24 to 48 hours after diarrhea stops.  You may consume probiotics. Probiotics are active cultures of beneficial bacteria. They may lessen the amount and number of diarrheal stools in adults. Probiotics can be found in yogurt with active cultures and in supplements.  Wash your hands well to avoid spreading the virus.  Only take over-the-counter or prescription medicines for pain, discomfort, or fever as directed by your caregiver. Do not give aspirin to children. Antidiarrheal medicines are not recommended.  Ask your caregiver if you should continue to take your regular prescribed and over-the-counter medicines.  Keep all follow-up appointments as directed by your caregiver. SEEK IMMEDIATE MEDICAL CARE IF:   You are unable to keep fluids down.  You do not urinate at least once every 6 to 8 hours.  You develop shortness of breath.  You notice blood in your stool or vomit. This may look like coffee grounds.  You have abdominal pain that  increases or is concentrated in one small area (localized).  You have persistent vomiting or diarrhea.  You have a fever.  The patient is a child younger than 3 months, and he or she has a fever.  The patient is a child older than 3 months, and he or she has a fever and persistent symptoms.  The  patient is a child older than 3 months, and he or she has a fever and symptoms suddenly get worse.  The patient is a baby, and he or she has no tears when crying. MAKE SURE YOU:   Understand these instructions.  Will watch your condition.  Will get help right away if you are not doing well or get worse. Document Released: 01/04/2005 Document Revised: 03/29/2011 Document Reviewed: 10/21/2010 [facility] Patient Information 2015 [place], [place]. This information is not intended to replace advice given to you by your health care provider. Make sure you discuss any questions you have with your health care provider.
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
## 2013-09-12 DIAGNOSIS — R112 Nausea with vomiting, unspecified: Secondary | ICD-10-CM | POA: Insufficient documentation

## 2013-09-12 DIAGNOSIS — R197 Diarrhea, unspecified: Principal | ICD-10-CM

## 2013-09-12 NOTE — Assessment & Plan Note (Addendum)
Pt is a 48 yo male with rather complicated medical hx including uncontrolled DM (last HA1c:13.5), CKDIII, HFpEF (LVEF: 10%), and cocaine abuse who presents to clinic for ED follow up.  He complains of intermittent crampy abdominal pain that began 3 weeks ago and 1 week of nausea (initially w/"clots") and non-bloody watery diarrhea.  He reports not eating anything "for the past 5 days."  He is frustrated because he was told he had an "infection in his urine" when he was in the ED but found this out later.  Workup in the ED revealed no etiology and apparently he refused to provide a stool sample.  Labs were all essentially stable--no leukocytosis, lipase and lactic acid normal, troponin normal, digoxin level normal.  Mild AG of 15.  He denies any associated fever/chills, urinary symptoms, recent travel, sick contacts, recent abx, new medications, or new rashes.  He does report a h/o kidney stones but states this pain does not feel like his previous episodes.  He reports his symptoms are worsening and he "doesn't care what we have to do" but wants to find out what's wrong.  Reports "I have three kids to take care off."  Vital signs are stable and he is not orthostatic.  Physical exam reveals positive bowel sounds with diffuse tenderness greater in the mid-lower/left quadrant without guarding or rebound.  No CVA tenderness.  DDx includes viral gastroenteritis, colitis, uremia, UTI, nephrolithiasis, gastroparesis.  More likely a viral gastroenteritis/colitis--not likely uremia as SCr is stable, UA revealed no LE/nitrites so unlikely UTI, no hematuria so unlikely nephrolithiasis, and gastroparesis unlikely given acuity.  He was given zofran in the ED but states this has not helped.  He has oxycodone for chronic pain but does not want to take this for pain because he "doesn't want to waste it."   -continue to monitor -encouraged him to use oxycodone for pain -return to clinic on 8/28 for follow-up

## 2013-09-13 ENCOUNTER — Telehealth: Payer: Self-pay | Admitting: Internal Medicine

## 2013-09-13 NOTE — Progress Notes (Signed)
I saw and evaluated the patient.  I personally confirmed the key portions of Dr. Shiela Mayer history and exam and reviewed pertinent patient test results.  The assessment, diagnosis, and plan were formulated together and I agree with the documentation in the resident's note.  Working diagnosis is a viral gastroenteritis/colitis (drinks city water).  Hemodynamically stable w/o orthostasis and starting to tolerate ice/water orally.  I believe he has just turned the corner and is improving symptomatically.  Will manage conservatively with supportive care and reassess later this week to assure he continues to improve.

## 2013-09-13 NOTE — Telephone Encounter (Signed)
I saw that Mr. Zarek was recently in the ER and also seen in [facility] by a different provider. I just called to see how he was doing but no answer. Left a general message. He is scheduled to return to opc tomorrow.

## 2013-09-14 ENCOUNTER — Encounter: Payer: Self-pay | Admitting: Internal Medicine

## 2013-09-14 ENCOUNTER — Encounter (HOSPITAL_COMMUNITY): Payer: Self-pay | Admitting: *Deleted

## 2013-09-14 ENCOUNTER — Observation Stay (HOSPITAL_COMMUNITY)

## 2013-09-14 ENCOUNTER — Ambulatory Visit (HOSPITAL_COMMUNITY)
Admission: RE | Admit: 2013-09-14 | Discharge: 2013-09-14 | Disposition: A | Discharge status: Home / Self Care | Payer: Medicare Other | Source: Ambulatory Visit | Attending: Internal Medicine | Admitting: Internal Medicine

## 2013-09-14 ENCOUNTER — Ambulatory Visit (INDEPENDENT_AMBULATORY_CARE_PROVIDER_SITE_OTHER): Payer: Medicare Other | Admitting: Internal Medicine

## 2013-09-14 ENCOUNTER — Inpatient Hospital Stay (HOSPITAL_COMMUNITY)
Admission: AD | Admit: 2013-09-14 | Discharge: 2013-10-08 | DRG: 291 | Disposition: A | Discharge status: Home / Self Care | Source: Ambulatory Visit | Attending: Internal Medicine | Admitting: Internal Medicine

## 2013-09-14 VITALS — BP 129/90 | HR 77 | Temp 95.4°F | Ht 72.0 in | Wt 231.0 lb

## 2013-09-14 DIAGNOSIS — R197 Diarrhea, unspecified: Secondary | ICD-10-CM

## 2013-09-14 DIAGNOSIS — I509 Heart failure, unspecified: Secondary | ICD-10-CM | POA: Diagnosis present

## 2013-09-14 DIAGNOSIS — R112 Nausea with vomiting, unspecified: Secondary | ICD-10-CM | POA: Diagnosis not present

## 2013-09-14 DIAGNOSIS — R079 Chest pain, unspecified: Secondary | ICD-10-CM | POA: Diagnosis present

## 2013-09-14 DIAGNOSIS — J45909 Unspecified asthma, uncomplicated: Secondary | ICD-10-CM | POA: Diagnosis present

## 2013-09-14 DIAGNOSIS — I4729 Other ventricular tachycardia: Secondary | ICD-10-CM | POA: Diagnosis present

## 2013-09-14 DIAGNOSIS — F1010 Alcohol abuse, uncomplicated: Secondary | ICD-10-CM | POA: Diagnosis present

## 2013-09-14 DIAGNOSIS — Z833 Family history of diabetes mellitus: Secondary | ICD-10-CM

## 2013-09-14 DIAGNOSIS — E876 Hypokalemia: Secondary | ICD-10-CM | POA: Diagnosis not present

## 2013-09-14 DIAGNOSIS — E871 Hypo-osmolality and hyponatremia: Secondary | ICD-10-CM | POA: Diagnosis present

## 2013-09-14 DIAGNOSIS — I422 Other hypertrophic cardiomyopathy: Secondary | ICD-10-CM | POA: Diagnosis present

## 2013-09-14 DIAGNOSIS — I10 Essential (primary) hypertension: Secondary | ICD-10-CM | POA: Diagnosis present

## 2013-09-14 DIAGNOSIS — Z9119 Patient's noncompliance with other medical treatment and regimen: Secondary | ICD-10-CM

## 2013-09-14 DIAGNOSIS — N179 Acute kidney failure, unspecified: Secondary | ICD-10-CM | POA: Diagnosis present

## 2013-09-14 DIAGNOSIS — R188 Other ascites: Secondary | ICD-10-CM | POA: Diagnosis present

## 2013-09-14 DIAGNOSIS — I5023 Acute on chronic systolic (congestive) heart failure: Secondary | ICD-10-CM | POA: Diagnosis present

## 2013-09-14 DIAGNOSIS — K5900 Constipation, unspecified: Secondary | ICD-10-CM | POA: Diagnosis not present

## 2013-09-14 DIAGNOSIS — I428 Other cardiomyopathies: Secondary | ICD-10-CM | POA: Diagnosis present

## 2013-09-14 DIAGNOSIS — I472 Ventricular tachycardia, unspecified: Secondary | ICD-10-CM | POA: Diagnosis present

## 2013-09-14 DIAGNOSIS — E875 Hyperkalemia: Secondary | ICD-10-CM | POA: Diagnosis present

## 2013-09-14 DIAGNOSIS — I130 Hypertensive heart and chronic kidney disease with heart failure and stage 1 through stage 4 chronic kidney disease, or unspecified chronic kidney disease: Principal | ICD-10-CM | POA: Diagnosis present

## 2013-09-14 DIAGNOSIS — Z794 Long term (current) use of insulin: Secondary | ICD-10-CM

## 2013-09-14 DIAGNOSIS — E785 Hyperlipidemia, unspecified: Secondary | ICD-10-CM | POA: Diagnosis present

## 2013-09-14 DIAGNOSIS — F1411 Cocaine abuse, in remission: Secondary | ICD-10-CM | POA: Diagnosis present

## 2013-09-14 DIAGNOSIS — R570 Cardiogenic shock: Secondary | ICD-10-CM

## 2013-09-14 DIAGNOSIS — K7030 Alcoholic cirrhosis of liver without ascites: Secondary | ICD-10-CM | POA: Diagnosis present

## 2013-09-14 DIAGNOSIS — Z7982 Long term (current) use of aspirin: Secondary | ICD-10-CM

## 2013-09-14 DIAGNOSIS — M1009 Idiopathic gout, multiple sites: Secondary | ICD-10-CM

## 2013-09-14 DIAGNOSIS — IMO0002 Reserved for concepts with insufficient information to code with codable children: Secondary | ICD-10-CM

## 2013-09-14 DIAGNOSIS — Z95810 Presence of automatic (implantable) cardiac defibrillator: Secondary | ICD-10-CM

## 2013-09-14 DIAGNOSIS — Z87891 Personal history of nicotine dependence: Secondary | ICD-10-CM

## 2013-09-14 DIAGNOSIS — K219 Gastro-esophageal reflux disease without esophagitis: Secondary | ICD-10-CM | POA: Diagnosis present

## 2013-09-14 DIAGNOSIS — K5732 Diverticulitis of large intestine without perforation or abscess without bleeding: Secondary | ICD-10-CM | POA: Diagnosis present

## 2013-09-14 DIAGNOSIS — G7000 Myasthenia gravis without (acute) exacerbation: Secondary | ICD-10-CM | POA: Diagnosis present

## 2013-09-14 DIAGNOSIS — F1410 Cocaine abuse, uncomplicated: Secondary | ICD-10-CM | POA: Diagnosis present

## 2013-09-14 DIAGNOSIS — Z91199 Patient's noncompliance with other medical treatment and regimen due to unspecified reason: Secondary | ICD-10-CM

## 2013-09-14 DIAGNOSIS — M109 Gout, unspecified: Secondary | ICD-10-CM | POA: Diagnosis present

## 2013-09-14 DIAGNOSIS — N058 Unspecified nephritic syndrome with other morphologic changes: Secondary | ICD-10-CM | POA: Diagnosis present

## 2013-09-14 DIAGNOSIS — M10022 Idiopathic gout, left elbow: Secondary | ICD-10-CM

## 2013-09-14 DIAGNOSIS — Z8249 Family history of ischemic heart disease and other diseases of the circulatory system: Secondary | ICD-10-CM

## 2013-09-14 DIAGNOSIS — G4733 Obstructive sleep apnea (adult) (pediatric): Secondary | ICD-10-CM | POA: Diagnosis present

## 2013-09-14 DIAGNOSIS — E1129 Type 2 diabetes mellitus with other diabetic kidney complication: Secondary | ICD-10-CM | POA: Diagnosis present

## 2013-09-14 DIAGNOSIS — I5022 Chronic systolic (congestive) heart failure: Secondary | ICD-10-CM

## 2013-09-14 DIAGNOSIS — K7460 Unspecified cirrhosis of liver: Secondary | ICD-10-CM | POA: Diagnosis present

## 2013-09-14 DIAGNOSIS — N189 Chronic kidney disease, unspecified: Principal | ICD-10-CM

## 2013-09-14 DIAGNOSIS — M10071 Idiopathic gout, right ankle and foot: Secondary | ICD-10-CM

## 2013-09-14 DIAGNOSIS — N183 Chronic kidney disease, stage 3 unspecified: Secondary | ICD-10-CM | POA: Diagnosis present

## 2013-09-14 DIAGNOSIS — I2510 Atherosclerotic heart disease of native coronary artery without angina pectoris: Secondary | ICD-10-CM | POA: Diagnosis present

## 2013-09-14 DIAGNOSIS — I252 Old myocardial infarction: Secondary | ICD-10-CM

## 2013-09-14 LAB — GLUCOSE, CAPILLARY
GLUCOSE-CAPILLARY: 164 mg/dL — AB (ref 70–99)
GLUCOSE-CAPILLARY: 233 mg/dL — AB (ref 70–99)
Glucose-Capillary: 178 mg/dL — ABNORMAL HIGH (ref 70–99)
Glucose-Capillary: 489 mg/dL — ABNORMAL HIGH (ref 70–99)

## 2013-09-14 LAB — COMPLETE METABOLIC PANEL WITH GFR
ALT: 10 U/L (ref 0–53)
AST: 21 U/L (ref 0–37)
Albumin: 3.6 g/dL (ref 3.5–5.2)
Alkaline Phosphatase: 91 U/L (ref 39–117)
BILIRUBIN TOTAL: 3.1 mg/dL — AB (ref 0.2–1.2)
BUN: 50 mg/dL — ABNORMAL HIGH (ref 6–23)
CHLORIDE: 90 meq/L — AB (ref 96–112)
CO2: 20 mEq/L (ref 19–32)
CREATININE: 2.84 mg/dL — AB (ref 0.50–1.35)
Calcium: 9.5 mg/dL (ref 8.4–10.5)
GFR, EST NON AFRICAN AMERICAN: 25 mL/min — AB
GFR, Est African American: 29 mL/min — ABNORMAL LOW
GLUCOSE: 212 mg/dL — AB (ref 70–99)
Potassium: 6.5 mEq/L (ref 3.5–5.3)
Sodium: 127 mEq/L — ABNORMAL LOW (ref 135–145)
Total Protein: 7.8 g/dL (ref 6.0–8.3)

## 2013-09-14 LAB — PRO B NATRIURETIC PEPTIDE: PRO B NATRI PEPTIDE: 10023 pg/mL — AB (ref <126)

## 2013-09-14 LAB — CBC WITH DIFFERENTIAL/PLATELET
Basophils Absolute: 0 10*3/uL (ref 0.0–0.1)
Basophils Relative: 1 % (ref 0–1)
EOS PCT: 1 % (ref 0–5)
Eosinophils Absolute: 0 10*3/uL (ref 0.0–0.7)
HCT: 50.7 % (ref 39.0–52.0)
Hemoglobin: 16.9 g/dL (ref 13.0–17.0)
LYMPHS ABS: 0.9 10*3/uL (ref 0.7–4.0)
Lymphocytes Relative: 21 % (ref 12–46)
MCH: 28.4 pg (ref 26.0–34.0)
MCHC: 33.3 g/dL (ref 30.0–36.0)
MCV: 85.1 fL (ref 78.0–100.0)
Monocytes Absolute: 0.4 10*3/uL (ref 0.1–1.0)
Monocytes Relative: 9 % (ref 3–12)
NEUTROS ABS: 2.9 10*3/uL (ref 1.7–7.7)
Neutrophils Relative %: 68 % (ref 43–77)
PLATELETS: 194 10*3/uL (ref 150–400)
RBC: 5.96 MIL/uL — ABNORMAL HIGH (ref 4.22–5.81)
RDW: 17.6 % — ABNORMAL HIGH (ref 11.5–15.5)
WBC: 4.3 10*3/uL (ref 4.0–10.5)

## 2013-09-14 LAB — GLUCOSE, RANDOM: Glucose, Bld: 179 mg/dL — ABNORMAL HIGH (ref 70–99)

## 2013-09-14 LAB — POTASSIUM
Potassium: 6.5 mEq/L (ref 3.7–5.3)
Potassium: 5.6 mEq/L — ABNORMAL HIGH (ref 3.7–5.3)

## 2013-09-14 LAB — TROPONIN I

## 2013-09-14 LAB — LIPASE, BLOOD: Lipase: 61 U/L — ABNORMAL HIGH (ref 11–59)

## 2013-09-14 LAB — DIGOXIN LEVEL

## 2013-09-14 LAB — LIPASE: Lipase: 70 U/L — ABNORMAL HIGH (ref 0–75)

## 2013-09-14 MED ORDER — INSULIN GLARGINE 100 UNIT/ML ~~LOC~~ SOLN
13.0000 [IU] | Freq: Every day | SUBCUTANEOUS | Status: DC
  Administered 2013-09-14 – 2013-09-18 (×5): 13 [IU] via SUBCUTANEOUS
  Filled 2013-09-14 (×6): qty 0.13

## 2013-09-14 MED ORDER — ONDANSETRON HCL 4 MG/2ML IJ SOLN
4.0000 mg | Freq: Once | INTRAMUSCULAR | Status: AC
  Administered 2013-09-14: 4 mg via INTRAVENOUS

## 2013-09-14 MED ORDER — ALBUTEROL SULFATE (2.5 MG/3ML) 0.083% IN NEBU: 2.5000 mg | INHALATION_SOLUTION | RESPIRATORY_TRACT | Status: DC | PRN

## 2013-09-14 MED ORDER — SODIUM CHLORIDE 0.9 % IJ SOLN: 3.0000 mL | INTRAMUSCULAR | Status: DC | PRN

## 2013-09-14 MED ORDER — FAMOTIDINE 20 MG PO TABS
20.0000 mg | ORAL_TABLET | Freq: Every day | ORAL | Status: DC
  Administered 2013-09-14 – 2013-10-08 (×25): 20 mg via ORAL
  Filled 2013-09-14 (×27): qty 1

## 2013-09-14 MED ORDER — ASPIRIN EC 81 MG PO TBEC
81.0000 mg | DELAYED_RELEASE_TABLET | Freq: Every day | ORAL | Status: DC
Start: 2013-09-14 — End: 2013-10-08
  Administered 2013-09-14 – 2013-10-08 (×25): 81 mg via ORAL
  Filled 2013-09-14 (×26): qty 1

## 2013-09-14 MED ORDER — ATORVASTATIN CALCIUM 80 MG PO TABS
80.0000 mg | ORAL_TABLET | Freq: Every day | ORAL | Status: DC
Start: 2013-09-14 — End: 2013-10-08
  Administered 2013-09-14 – 2013-10-07 (×24): 80 mg via ORAL
  Filled 2013-09-14 (×26): qty 1

## 2013-09-14 MED ORDER — LORAZEPAM 1 MG PO TABS
1.0000 mg | ORAL_TABLET | Freq: Two times a day (BID) | ORAL | Status: DC | PRN
  Administered 2013-09-18 – 2013-10-07 (×15): 1 mg via ORAL
  Filled 2013-09-14 (×16): qty 1

## 2013-09-14 MED ORDER — SODIUM POLYSTYRENE SULFONATE 15 GM/60ML PO SUSP
30.0000 g | Freq: Once | ORAL | Status: AC
  Administered 2013-09-14: 30 g via ORAL
  Filled 2013-09-14: qty 120

## 2013-09-14 MED ORDER — DEXTROSE 50 % IV SOLN
1.0000 | Freq: Once | INTRAVENOUS | Status: AC
  Administered 2013-09-14: 50 mL via INTRAVENOUS
  Filled 2013-09-14 (×2): qty 50

## 2013-09-14 MED ORDER — INSULIN ASPART 100 UNIT/ML ~~LOC~~ SOLN
0.0000 [IU] | Freq: Three times a day (TID) | SUBCUTANEOUS | Status: DC
  Administered 2013-09-15 (×2): 2 [IU] via SUBCUTANEOUS
  Administered 2013-09-15 – 2013-09-16 (×2): 3 [IU] via SUBCUTANEOUS
  Administered 2013-09-16: 2 [IU] via SUBCUTANEOUS

## 2013-09-14 MED ORDER — DIGOXIN 125 MCG PO TABS
0.1250 mg | ORAL_TABLET | Freq: Every day | ORAL | Status: DC
  Administered 2013-09-14 – 2013-09-17 (×3): 0.125 mg via ORAL
  Filled 2013-09-14 (×5): qty 1

## 2013-09-14 MED ORDER — SODIUM CHLORIDE 0.9 % IJ SOLN
3.0000 mL | Freq: Two times a day (BID) | INTRAMUSCULAR | Status: DC
  Administered 2013-09-14: 3 mL via INTRAVENOUS

## 2013-09-14 MED ORDER — SODIUM CHLORIDE 0.9 % IJ SOLN
3.0000 mL | Freq: Two times a day (BID) | INTRAMUSCULAR | Status: DC
  Administered 2013-09-14 – 2013-10-06 (×22): 3 mL via INTRAVENOUS

## 2013-09-14 MED ORDER — ONDANSETRON HCL 4 MG/2ML IJ SOLN
4.0000 mg | Freq: Four times a day (QID) | INTRAMUSCULAR | Status: DC | PRN
  Administered 2013-09-14 – 2013-09-18 (×4): 4 mg via INTRAVENOUS
  Filled 2013-09-14 (×5): qty 2

## 2013-09-14 MED ORDER — SODIUM CHLORIDE 0.9 % IV SOLN
250.0000 mL | INTRAVENOUS | Status: DC | PRN
  Administered 2013-09-17 – 2013-09-24 (×3): 250 mL via INTRAVENOUS

## 2013-09-14 MED ORDER — SODIUM BICARBONATE 8.4 % IV SOLN
50.0000 meq | Freq: Once | INTRAVENOUS | Status: AC
  Administered 2013-09-14: 50 meq via INTRAVENOUS
  Filled 2013-09-14 (×2): qty 50

## 2013-09-14 MED ORDER — IOHEXOL 300 MG/ML  SOLN
25.0000 mL | INTRAMUSCULAR | Status: AC
  Administered 2013-09-14: 25 mL via ORAL

## 2013-09-14 MED ORDER — HYDRALAZINE HCL 25 MG PO TABS
25.0000 mg | ORAL_TABLET | Freq: Three times a day (TID) | ORAL | Status: DC
  Administered 2013-09-14 – 2013-09-16 (×8): 25 mg via ORAL
  Filled 2013-09-14 (×15): qty 1

## 2013-09-14 MED ORDER — SODIUM CHLORIDE 0.9 % IV SOLN: 1.0000 g | Freq: Once | INTRAVENOUS | Status: DC

## 2013-09-14 MED ORDER — TORSEMIDE 20 MG PO TABS
60.0000 mg | ORAL_TABLET | Freq: Two times a day (BID) | ORAL | Status: DC
Start: 2013-09-14 — End: 2013-09-15
  Administered 2013-09-14 – 2013-09-15 (×2): 60 mg via ORAL
  Filled 2013-09-14 (×6): qty 3

## 2013-09-14 MED ORDER — PROMETHAZINE HCL 25 MG/ML IJ SOLN: 12.5000 mg | Freq: Four times a day (QID) | INTRAMUSCULAR | Status: DC | PRN

## 2013-09-14 MED ORDER — METOLAZONE 2.5 MG PO TABS
2.5000 mg | ORAL_TABLET | Freq: Once | ORAL | Status: AC
  Administered 2013-09-14: 2.5 mg via ORAL
  Filled 2013-09-14: qty 1

## 2013-09-14 MED ORDER — MORPHINE SULFATE 2 MG/ML IJ SOLN
1.0000 mg | Freq: Four times a day (QID) | INTRAMUSCULAR | Status: DC | PRN
  Administered 2013-09-15 – 2013-09-17 (×7): 1 mg via INTRAVENOUS
  Filled 2013-09-14 (×8): qty 1

## 2013-09-14 MED ORDER — HEPARIN SODIUM (PORCINE) 5000 UNIT/ML IJ SOLN
5000.0000 [IU] | Freq: Three times a day (TID) | INTRAMUSCULAR | Status: DC
  Administered 2013-09-14 – 2013-09-25 (×19): 5000 [IU] via SUBCUTANEOUS
  Filled 2013-09-14 (×75): qty 1

## 2013-09-14 MED ORDER — GABAPENTIN 100 MG PO CAPS
200.0000 mg | ORAL_CAPSULE | Freq: Two times a day (BID) | ORAL | Status: DC
Start: 2013-09-14 — End: 2013-09-22
  Administered 2013-09-14 – 2013-09-21 (×15): 200 mg via ORAL
  Filled 2013-09-14 (×18): qty 2

## 2013-09-14 MED ORDER — INSULIN ASPART 100 UNIT/ML IV SOLN
10.0000 [IU] | Freq: Once | INTRAVENOUS | Status: AC
  Administered 2013-09-14: 10 [IU] via INTRAVENOUS

## 2013-09-14 MED ORDER — METOLAZONE 2.5 MG PO TABS
2.5000 mg | ORAL_TABLET | ORAL | Status: DC
  Filled 2013-09-14: qty 1

## 2013-09-14 MED ORDER — OXYCODONE HCL 5 MG PO TABS
10.0000 mg | ORAL_TABLET | Freq: Three times a day (TID) | ORAL | Status: DC | PRN
  Administered 2013-09-14 – 2013-09-17 (×6): 10 mg via ORAL
  Filled 2013-09-14 (×11): qty 2
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
## 2013-09-14 NOTE — Progress Notes (Signed)
CRITICAL VALUE ALERT  Critical value received:  K 6.5  Date of notification: 09/14/13  Time of notification:  1543  Critical value read back:Yes.    Nurse who received alert:  Dayna Barker  MD notified (1st page):  Dr. Allena Katz  Time of first page:  1545  MD notified (2nd page):  Time of second page:  Responding MD:  Dr. Allena Katz  Time MD responded:  [id number]

## 2013-09-14 NOTE — Progress Notes (Signed)
Internal Medicine Clinic Attending Date of visit: 09/14/2013  I saw and evaluated the patient.  I personally confirmed the key portions of the history and exam documented by Dr. Rivet and I reviewed pertinent patient test results.  The assessment, diagnosis, and plan were formulated together and I agree with the documentation in the resident's note. 

## 2013-09-14 NOTE — Patient Instructions (Signed)
Pt to be admitted for obs.

## 2013-09-14 NOTE — Assessment & Plan Note (Addendum)
Pt is a 48 yo male with rather complicated medical hx including uncontrolled DM (last HA1c:13.5), CKDIII, HFpEF (LVEF: 10%), and cocaine abuse who presents to clinic for follow up (seen on 8/25)  He continues to complain of  intermittent crampy abdominal pain that began 3 weeks ago and 1 week of nausea with vomiting (initially w/"clots") .  Reports diarrhea has resolved.  He was given zofran in the ED and has been taking it but states it hasn't helped him.  He has not been able to eat anything for the past 5 days but ate some green beans, rice, and meat last evening at a soup kitchen and vomited this AM.  He was in the ED with similar symptoms prior to his clinic visit on 8/25 which revealed no etiology and he refused to provide a stool sample. Labs were all essentially stable--no leukocytosis, lipase and lactic acid normal, troponin normal, digoxin level normal. Mild AG of 15. He endorses chills, SOB, and CP.  Denies urinary symptoms, recent travel, sick contacts, recent abx, new medications, or new rashes. He reports a h/o kidney stones but states this pain does not feel like his previous episodes. He reports his symptoms are worsening. Vital signs are stable and he is not orthostatic. Physical exam reveals positive bowel sounds with diffuse tenderness greater in the epigastrium/mid-lower/left quadrant without guarding or rebound. No CVA tenderness. DDx includes viral gastroenteritis/colitis, PUD, nephrolithiasis.  EKG done today without acute changes.  -CMP, cbc/dif, lipase, UA -would get CT Abd/pelvis wo contrast -may need EGD -admit to tele for observation
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
## 2013-09-14 NOTE — H&P (Signed)
  I have seen and examined the patient myself, and I have reviewed the note by Enis Gash, MS 3 and was present during the interview and physical exam.  Please see my separate H&P for additional findings, assessment, and plan.   Signed: Heywood Iles, MD 09/14/2013, 9:37 PM
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
## 2013-09-14 NOTE — Assessment & Plan Note (Addendum)
Pt reports worsening SOB since he was seen here on Tuesday, 8/25.  He reports being unable to walk around his house without feeling SOB.  He reports compliance with his all his meds (torsemide, metolazone, digoxin, hydralazine).  Has reports drinking more fluids since his last visit because he did not want to get dehydrated.  He does have some LE swelling which he reports is about the same as usual.  Lungs are clear on exam and 2-3+ pitting edema b/l. Also is reporting some substernal chest pain that began yesterday while he was sitting down that he describes as squeezing, non-radiating, lasting about 30 minutes.  Endorses associated nausea/vomiting, diaphoresis, weakness, and no dizziness.  Reports his hospice nurse gave him two 81mg  ASA yesterday which helped.  EKG done today reveals no acute changes.   -troponin, BNP -CXR

## 2013-09-14 NOTE — Progress Notes (Signed)
Attempted to start NSL- unsuccessful; poor IV access.

## 2013-09-15 ENCOUNTER — Observation Stay (HOSPITAL_COMMUNITY)

## 2013-09-15 DIAGNOSIS — K5730 Diverticulosis of large intestine without perforation or abscess without bleeding: Secondary | ICD-10-CM

## 2013-09-15 DIAGNOSIS — E785 Hyperlipidemia, unspecified: Secondary | ICD-10-CM | POA: Diagnosis present

## 2013-09-15 DIAGNOSIS — I2510 Atherosclerotic heart disease of native coronary artery without angina pectoris: Secondary | ICD-10-CM

## 2013-09-15 DIAGNOSIS — E871 Hypo-osmolality and hyponatremia: Secondary | ICD-10-CM | POA: Diagnosis present

## 2013-09-15 DIAGNOSIS — E876 Hypokalemia: Secondary | ICD-10-CM | POA: Diagnosis not present

## 2013-09-15 DIAGNOSIS — N183 Chronic kidney disease, stage 3 unspecified: Secondary | ICD-10-CM | POA: Diagnosis present

## 2013-09-15 DIAGNOSIS — K5900 Constipation, unspecified: Secondary | ICD-10-CM | POA: Diagnosis not present

## 2013-09-15 DIAGNOSIS — Z833 Family history of diabetes mellitus: Secondary | ICD-10-CM | POA: Diagnosis not present

## 2013-09-15 DIAGNOSIS — Z794 Long term (current) use of insulin: Secondary | ICD-10-CM | POA: Diagnosis not present

## 2013-09-15 DIAGNOSIS — IMO0002 Reserved for concepts with insufficient information to code with codable children: Secondary | ICD-10-CM | POA: Diagnosis not present

## 2013-09-15 DIAGNOSIS — I422 Other hypertrophic cardiomyopathy: Secondary | ICD-10-CM | POA: Diagnosis present

## 2013-09-15 DIAGNOSIS — F1410 Cocaine abuse, uncomplicated: Secondary | ICD-10-CM | POA: Diagnosis present

## 2013-09-15 DIAGNOSIS — I5022 Chronic systolic (congestive) heart failure: Secondary | ICD-10-CM

## 2013-09-15 DIAGNOSIS — E119 Type 2 diabetes mellitus without complications: Secondary | ICD-10-CM

## 2013-09-15 DIAGNOSIS — I509 Heart failure, unspecified: Secondary | ICD-10-CM | POA: Diagnosis present

## 2013-09-15 DIAGNOSIS — K7030 Alcoholic cirrhosis of liver without ascites: Secondary | ICD-10-CM | POA: Diagnosis present

## 2013-09-15 DIAGNOSIS — Z7982 Long term (current) use of aspirin: Secondary | ICD-10-CM | POA: Diagnosis not present

## 2013-09-15 DIAGNOSIS — N189 Chronic kidney disease, unspecified: Principal | ICD-10-CM

## 2013-09-15 DIAGNOSIS — Z91199 Patient's noncompliance with other medical treatment and regimen due to unspecified reason: Secondary | ICD-10-CM | POA: Diagnosis not present

## 2013-09-15 DIAGNOSIS — K219 Gastro-esophageal reflux disease without esophagitis: Secondary | ICD-10-CM | POA: Diagnosis present

## 2013-09-15 DIAGNOSIS — M109 Gout, unspecified: Secondary | ICD-10-CM | POA: Diagnosis present

## 2013-09-15 DIAGNOSIS — G7000 Myasthenia gravis without (acute) exacerbation: Secondary | ICD-10-CM | POA: Diagnosis present

## 2013-09-15 DIAGNOSIS — N058 Unspecified nephritic syndrome with other morphologic changes: Secondary | ICD-10-CM | POA: Diagnosis present

## 2013-09-15 DIAGNOSIS — Z87891 Personal history of nicotine dependence: Secondary | ICD-10-CM | POA: Diagnosis not present

## 2013-09-15 DIAGNOSIS — I130 Hypertensive heart and chronic kidney disease with heart failure and stage 1 through stage 4 chronic kidney disease, or unspecified chronic kidney disease: Principal | ICD-10-CM

## 2013-09-15 DIAGNOSIS — G4733 Obstructive sleep apnea (adult) (pediatric): Secondary | ICD-10-CM | POA: Diagnosis present

## 2013-09-15 DIAGNOSIS — Z95810 Presence of automatic (implantable) cardiac defibrillator: Secondary | ICD-10-CM | POA: Diagnosis not present

## 2013-09-15 DIAGNOSIS — I428 Other cardiomyopathies: Secondary | ICD-10-CM | POA: Diagnosis present

## 2013-09-15 DIAGNOSIS — Z8249 Family history of ischemic heart disease and other diseases of the circulatory system: Secondary | ICD-10-CM | POA: Diagnosis not present

## 2013-09-15 DIAGNOSIS — R188 Other ascites: Secondary | ICD-10-CM | POA: Diagnosis present

## 2013-09-15 DIAGNOSIS — J45909 Unspecified asthma, uncomplicated: Secondary | ICD-10-CM | POA: Diagnosis present

## 2013-09-15 DIAGNOSIS — R112 Nausea with vomiting, unspecified: Secondary | ICD-10-CM | POA: Diagnosis present

## 2013-09-15 DIAGNOSIS — I472 Ventricular tachycardia: Secondary | ICD-10-CM | POA: Diagnosis present

## 2013-09-15 DIAGNOSIS — F1010 Alcohol abuse, uncomplicated: Secondary | ICD-10-CM | POA: Diagnosis present

## 2013-09-15 DIAGNOSIS — N179 Acute kidney failure, unspecified: Secondary | ICD-10-CM | POA: Diagnosis present

## 2013-09-15 DIAGNOSIS — I252 Old myocardial infarction: Secondary | ICD-10-CM | POA: Diagnosis not present

## 2013-09-15 DIAGNOSIS — E1129 Type 2 diabetes mellitus with other diabetic kidney complication: Secondary | ICD-10-CM | POA: Diagnosis present

## 2013-09-15 DIAGNOSIS — K5732 Diverticulitis of large intestine without perforation or abscess without bleeding: Secondary | ICD-10-CM | POA: Diagnosis present

## 2013-09-15 DIAGNOSIS — E875 Hyperkalemia: Secondary | ICD-10-CM | POA: Diagnosis present

## 2013-09-15 DIAGNOSIS — I5023 Acute on chronic systolic (congestive) heart failure: Secondary | ICD-10-CM | POA: Diagnosis present

## 2013-09-15 DIAGNOSIS — I4729 Other ventricular tachycardia: Secondary | ICD-10-CM | POA: Diagnosis present

## 2013-09-15 LAB — COMPREHENSIVE METABOLIC PANEL
ALT: 9 U/L (ref 0–53)
AST: 25 U/L (ref 0–37)
Albumin: 3.4 g/dL — ABNORMAL LOW (ref 3.5–5.2)
Alkaline Phosphatase: 83 U/L (ref 39–117)
Anion gap: 19 — ABNORMAL HIGH (ref 5–15)
BUN: 53 mg/dL — AB (ref 6–23)
CHLORIDE: 91 meq/L — AB (ref 96–112)
CO2: 19 meq/L (ref 19–32)
CREATININE: 3.1 mg/dL — AB (ref 0.50–1.35)
Calcium: 9.8 mg/dL (ref 8.4–10.5)
GFR calc Af Amer: 26 mL/min — ABNORMAL LOW (ref >90)
GFR calc non Af Amer: 23 mL/min — ABNORMAL LOW (ref >90)
Glucose, Bld: 199 mg/dL — ABNORMAL HIGH (ref 70–99)
POTASSIUM: 5.1 meq/L (ref 3.7–5.3)
Sodium: 129 mEq/L — ABNORMAL LOW (ref 137–147)
Total Bilirubin: 2.8 mg/dL — ABNORMAL HIGH (ref 0.3–1.2)
Total Protein: 7.7 g/dL (ref 6.0–8.3)

## 2013-09-15 LAB — HEPATITIS PANEL, ACUTE
HCV Ab: NEGATIVE
Hep A IgM: NONREACTIVE
Hep B C IgM: NONREACTIVE
Hepatitis B Surface Ag: NEGATIVE

## 2013-09-15 LAB — CARBOXYHEMOGLOBIN
CARBOXYHEMOGLOBIN: 1.2 % (ref 0.5–1.5)
METHEMOGLOBIN: 0.6 % (ref 0.0–1.5)
O2 SAT: 51.4 %
Total hemoglobin: 15.2 g/dL (ref 13.5–18.0)

## 2013-09-15 LAB — MRSA PCR SCREENING: MRSA BY PCR: POSITIVE — AB

## 2013-09-15 LAB — CREATININE, URINE, RANDOM: CREATININE, URINE: 198.72 mg/dL

## 2013-09-15 LAB — URINALYSIS, ROUTINE W REFLEX MICROSCOPIC
GLUCOSE, UA: NEGATIVE mg/dL
Ketones, ur: NEGATIVE mg/dL
Leukocytes, UA: NEGATIVE
Nitrite: NEGATIVE
Protein, ur: >300 mg/dL — AB
Specific Gravity, Urine: 1.018 (ref 1.005–1.030)
Urobilinogen, UA: 1 mg/dL (ref 0.0–1.0)
pH: 5 (ref 5.0–8.0)

## 2013-09-15 LAB — CLOSTRIDIUM DIFFICILE BY PCR: Toxigenic C. Difficile by PCR: NEGATIVE

## 2013-09-15 LAB — GLUCOSE, CAPILLARY
GLUCOSE-CAPILLARY: 188 mg/dL — AB (ref 70–99)
Glucose-Capillary: 161 mg/dL — ABNORMAL HIGH (ref 70–99)
Glucose-Capillary: 170 mg/dL — ABNORMAL HIGH (ref 70–99)
Glucose-Capillary: 207 mg/dL — ABNORMAL HIGH (ref 70–99)

## 2013-09-15 LAB — CBC
HEMATOCRIT: 45.7 % (ref 39.0–52.0)
Hemoglobin: 15.7 g/dL (ref 13.0–17.0)
MCH: 28.6 pg (ref 26.0–34.0)
MCHC: 34.4 g/dL (ref 30.0–36.0)
MCV: 83.2 fL (ref 78.0–100.0)
Platelets: 183 10*3/uL (ref 150–400)
RBC: 5.49 MIL/uL (ref 4.22–5.81)
RDW: 17.3 % — ABNORMAL HIGH (ref 11.5–15.5)
WBC: 4.1 10*3/uL (ref 4.0–10.5)

## 2013-09-15 LAB — RAPID URINE DRUG SCREEN, HOSP PERFORMED
AMPHETAMINES: NOT DETECTED
Barbiturates: NOT DETECTED
Benzodiazepines: NOT DETECTED
COCAINE: NOT DETECTED
OPIATES: NOT DETECTED
Tetrahydrocannabinol: NOT DETECTED

## 2013-09-15 LAB — URINE MICROSCOPIC-ADD ON

## 2013-09-15 LAB — TROPONIN I
Troponin I: <0.3 ng/mL (ref <0.30)
Troponin I: <0.3 ng/mL (ref <0.30)

## 2013-09-15 LAB — UREA NITROGEN, URINE: Urea Nitrogen, Ur: 473 mg/dL

## 2013-09-15 MED ORDER — MILRINONE IN DEXTROSE 20 MG/100ML IV SOLN
0.2500 ug/kg/min | INTRAVENOUS | Status: DC
  Administered 2013-09-15 – 2013-09-19 (×8): 0.25 ug/kg/min via INTRAVENOUS
  Filled 2013-09-15 (×9): qty 100

## 2013-09-15 MED ORDER — FUROSEMIDE 10 MG/ML IJ SOLN
80.0000 mg | Freq: Two times a day (BID) | INTRAMUSCULAR | Status: DC
  Administered 2013-09-15 – 2013-09-16 (×3): 80 mg via INTRAVENOUS
  Filled 2013-09-15 (×5): qty 8

## 2013-09-15 NOTE — Progress Notes (Signed)
Subjective: Overnight, he had diarrhea x 5 with vomiting x 1. He feels short of breath while in bed but denies fever/chills, melena, hemoptysis, hematochezia.   Objective: Vital signs in last 24 hours: Filed Vitals:   09/14/13 2036 09/15/13 0012 09/15/13 0500 09/15/13 0744  BP: 104/83 98/78 109/82 113/79  Pulse: 99 99 99   Temp: 98.3 F (36.8 C) 97.6 F (36.4 C) 98 F (36.7 C) 97.9 F (36.6 C)  TempSrc: Oral Oral Oral Oral  Resp: Weight:   231 lb (104.781 kg)   SpO2: 100% 100% 94% 99%   Weight change:   Intake/Output Summary (Last 24 hours) at 09/15/13 0944 Last data filed at 09/14/13 1700  Gross per 24 hour  Intake    100 ml  Output      0 ml  Net    100 ml   General appearance: cooperative, fatigued  Lungs: clear to auscultation bilaterally; no crackles, rales, or ronchi  Heart: RRR, 2/6 systolic murmur LLSB, JVD+ Abdomen: soft, tender to palpation with increased pain in LLQ; bowel sounds normal; no masses, no organomegaly.  Extremities: 2+ pitting edema in legs bilaterally; swollen, enlarged ankles; 2+ PT/DP pulses  Neurologic: alert and oriented to general circumstances   Lab Results: Basic Metabolic Panel:  Recent Labs Lab 09/14/13 1203  09/14/13 1923 09/15/13 0105  NA 127*  --   --  129*  K 6.5*  < > 5.6* 5.1  CL 90*  --   --  91*  CO2 20  --   --  19  GLUCOSE 212*  --  179* 199*  BUN 50*  --   --  53*  CREATININE 2.84*  --   --  3.10*  CALCIUM 9.5  --   --  9.8  < > = values in this interval not displayed.  Liver Function Tests:  Recent Labs Lab 09/14/13 1203 09/15/13 0105  AST 21 25  ALT 10 9  ALKPHOS 91 83  BILITOT 3.1* 2.8*  PROT 7.8 7.7  ALBUMIN 3.6 3.4*   CBC:  Recent Labs Lab 09/08/13 2223 09/14/13 1203 09/15/13 0105  WBC 3.8* 4.3 4.1  NEUTROABS 2.5 2.9  --   HGB 14.8 16.9 15.7  HCT 45.0 50.7 45.7  MCV 82.9 85.1 83.2  PLT 185 194 183   Cardiac Enzymes:  Recent Labs Lab 09/14/13 1923 09/15/13 0105  09/15/13 0732  TROPONINI <0.30 <0.30 <0.30   CBG:  Recent Labs Lab 09/14/13 1028 09/14/13 1653 09/14/13 1744 09/14/13 2043 09/15/13 0746  GLUCAP 178* 489* 164* 233* 161*   Urine Drug Screen: Drugs of Abuse     Component Value Date/Time   LABOPIA NONE DETECTED 09/14/2013 2359   LABOPIA NEG 02/01/2012 1356   COCAINSCRNUR NONE DETECTED 09/14/2013 2359   COCAINSCRNUR PPS 02/01/2012 1356   LABBENZ NONE DETECTED 09/14/2013 2359   LABBENZ PPS 02/01/2012 1356   LABBENZ NEG 07/09/2010 1522   AMPHETMU NONE DETECTED 09/14/2013 2359   AMPHETMU NEG 07/09/2010 1522   THCU NONE DETECTED 09/14/2013 2359   LABBARB NONE DETECTED 09/14/2013 2359   LABBARB NEG 02/01/2012 1356    Studies/Results: Ct Abdomen Pelvis Wo Contrast  09/14/2013   CLINICAL DATA:  Nausea and vomiting for 4 weeks. Diarrhea starting 5 days ago. Some vomiting of blood. Left lower quadrant abdominal pain.  EXAM: CT ABDOMEN AND PELVIS WITHOUT CONTRAST  TECHNIQUE: Multidetector CT imaging of the abdomen and pelvis was performed following the standard protocol without IV  contrast.  COMPARISON:  08/15/2009  FINDINGS: Mild atelectasis in the lung bases.  Cardiac enlargement.  Probable hepatic cirrhosis with enlarged left lateral segment and nodular contour. Small amount of ascites in the upper abdomen around the liver, extending along both pericolic gutters, and into the pelvis. Increased density in the gallbladder suggesting sludge. Unenhanced appearance of the spleen, pancreas, adrenal glands, kidneys, abdominal aorta, inferior vena cava, and retroperitoneal lymph nodes is unremarkable stomach and small bowel are unremarkable. Contrast material flows through to the colon without evidence of obstruction. Colon is not abnormally distended. Edema throughout the subcutaneous fat. Mild mesenteric edema. No free air in the abdomen.  Pelvis: Wall thickening in the sigmoid colon with diverticulosis. Probable early diverticulitis. No abscess. No  significant pelvic lymphadenopathy. Prostate gland is not enlarged. Mild degenerative changes in the spine. No destructive bone lesions.  IMPRESSION: Diverticulosis of the colon with mild thickening of the sigmoid colon suggesting early diverticulitis. No abscess. Hepatic cirrhosis with mild ascites and edema throughout the subcutaneous soft tissues. Cardiac enlargement.   Electronically Signed   By: Burman Nieves M.D.   On: 09/14/2013 23:51   Dg Chest 2 View  09/15/2013   CLINICAL DATA:  Chest pain.  EXAM: CHEST  2 VIEW  COMPARISON:  06/28/2013  FINDINGS: Cardiac pacemaker. Cardiac enlargement without vascular congestion. Mild perihilar infiltration suggesting edema. Appearance is improved since previous study. No blunting of costophrenic angles. No pneumothorax.  IMPRESSION: Cardiac enlargement with mild perihilar edema.   Electronically Signed   By: Burman Nieves M.D.   On: 09/15/2013 00:08   Medications: I have reviewed the patient's current medications. Scheduled Meds: . aspirin EC  81 mg Oral Daily  . atorvastatin  80 mg Oral q1800  . digoxin  0.125 mg Oral Daily  . famotidine  20 mg Oral Daily  . gabapentin  200 mg Oral BID  . heparin  5,000 Units Subcutaneous 3 times per day  . hydrALAZINE  25 mg Oral 3 times per day  . insulin aspart  0-9 Units Subcutaneous TID WC  . insulin glargine  13 Units Subcutaneous QHS  . sodium chloride  3 mL Intravenous Q12H  . sodium chloride  3 mL Intravenous Q12H  . torsemide  60 mg Oral BID   Continuous Infusions:  PRN Meds:.sodium chloride, albuterol, LORazepam, morphine injection, ondansetron (ZOFRAN) IV, oxyCODONE, promethazine, sodium chloride Assessment/Plan:  Mr. Gallihugh is a 48 year old male with poorly controlled DM2 (A1c 13.5), CKD Stage 3, chronic systolic non-ischemic CHF (EF 45%, 07/18/12) 2/2 polysubstance abuse (EtOH, cocaine) hospitalized for possible early diverticulitis and cardiorenal syndrome.   #Possible early diverticulitis: Ab  CT showed diverticulosis of colon with mild thickening of sigmoid colon suggestive of early diverticulitis and consistent with LLQ pain. Infectious etiology still suspect given persistent diarrhea though he remains afebrile. Hepatitis panel unremarkable. -Check C diff -Give Phenergan 12.5mg  prn for nausea/vomiting -Continue monitoring orthostatics, I&Os  #Cardiorenal syndrome: Crt 3 today, increased from yesterday and baseline 2.5. He did receive metolazone 2.5mg  overnight. Dark urine and cold extremities indicate poor perfusion though hypervolemic on exam as evidenced by peripheral edema and CXR findings. Fe urea 13.5% further suggests pre-renal azotemia. HF team consulted for further recs. -Start milrinone 0.25 mcg/kg -Insert central line for to assess CVP & CO-Hb -Switch torsemide 60mg  BID with Lasix 80mg  IV BID -Hold digoxin given bradycardia (HR 47)  #Hyperkalemia: Resolved. K 5.1 this AM, down from 6.5  -Repeat BMET tomorrow -Monitor on telemetry   #CAD: Continue  atorvastatin .   #HTN: Continue hydralazine  q8h.   #DM2: Continue on sensitive SSI.   #GERD: Continue famotidine .   #Asthma: Continue albuterol 2.5mg  q2h prn for wheezing.   #FEN:  -Diet: carb modified   #DVT prophylaxis: Heparin 5000 units subcutaneous   #CODE STATUS: FULL CODE.  -Discussed with him the likelihood of poor outcome with resuscitation and intubation and he acknowledged   Dispo: Disposition is deferred at this time, awaiting improvement of current medical problems.  Anticipated discharge in approximately 1-2 day(s).   The patient does have a current PCP Baltazar Apo, MD) and does need an [facility] hospital follow-up appointment after discharge.  The patient does not know have transportation limitations that hinder transportation to clinic appointments.  .Services Needed at time of discharge: Y = Yes, Blank = No PT:   OT:   RN:   Equipment:   Other:     LOS: 1 day   Heywood Iles, MD 09/15/2013, 9:44 AM

## 2013-09-15 NOTE — Progress Notes (Signed)
  Date: 09/15/2013  Patient name: [name]  Medical record number: [id number]  Date of birth: 1965-01-21   I have seen and evaluated [name] and discussed their care with the Residency Team. [name] was seen with the IM team. He was admitted from [facility] for 4 weeks N//V, 5 days of D, ABD pain in LLQ, hyperkalemia, 10 lb weight increase since April 2015, and acute on chronic renal failure.  He is enrolled with and is active with home hospice. He understands severity of his cardiac dz but denied having GOC and end of life discussions. He states he wants everything done but team explained CPR, defib, and intubation highly unlikely to have good outcome and we would not rec it. He agreed to think about it.  One exam, BP 102/73. HRRR with systolic murmur. Lungs had great air flow with no crackles. ABD obese, soft + tenderness LLQ. Ext cool L>R but with DP pulses. + 2 edema to thighs. Mental status a bit sluggish.  Cr 3.1 up from 2.1 on the 22nd. Na 129, K 5.1, trop negative x 4. CXR Kerley B's and cephalization,.   Assessment and Plan: I have seen and evaluated the patient as outlined above. I agree with the formulated Assessment and Plan as detailed in the residents' admission note, with the following changes:   1. ABD pain, N/V/D - Currently, the most likely etiologies are C diff (stool is being tested) and diverticulitis (CT suggested mild thickening of the sigmoid). Will start with C diff testing and if negative and pain and D continue / increase would then start empiric ABX but no ABX now.   2. Acute on chronic non ischemic heart failure - pt does not seem to be perfusing kidneys, peripheries, and brain well and vol status confusing. Will consult HF team - rec milrinone and IV lasix. Appreciate their assistance.   3. Code status / end of life - discussion started. Will get hospice records and cont to discuss with pt.   Burns Spain, MD 8/29/20159:43 AM
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
## 2013-09-15 NOTE — Progress Notes (Addendum)
Pt c/o 9 out of 10 back, side, chest cramping; EKG obtained; currently not time for pain medications; paged MD on call; will come see pt; will continue to assess; no new changes noted on EKG except for pt is sinus tachycardia with occassional PVCs

## 2013-09-15 NOTE — Progress Notes (Signed)
UR Completed.  Brendaly Townsel Jane [phone] 09/15/2013  

## 2013-09-15 NOTE — Procedures (Signed)
Central Venous Catheter Insertion Procedure Note AMITOJ BOLICH [id number] Jul 03, 1965  Procedure: Insertion of Central Venous Catheter Indications: Assessment of intravascular volume  Procedure Details Consent: Risks of procedure as well as the alternatives and risks of each were explained to the (patient/caregiver).  Consent for procedure obtained. Time Out: Verified patient identification, verified procedure, site/side was marked, verified correct patient position, special equipment/implants available, medications/allergies/relevent history reviewed, required imaging and test results available.  Performed  Maximum sterile technique was used including antiseptics, cap, gloves, gown, hand hygiene, mask and sheet. Skin prep: Chlorhexidine; local anesthetic administered A antimicrobial bonded/coated triple lumen catheter was placed in the right internal jugular vein using the Seldinger technique.  Evaluation Blood flow good Complications: No apparent complications Patient did tolerate procedure well. Chest X-ray ordered to verify placement.  CXR: pending.  Arvilla Meres MD 09/15/2013, 11:38 AM

## 2013-09-15 NOTE — Progress Notes (Signed)
MD Garald Braver notified of patient's heart rate. Orders to hold 10AM digoxin

## 2013-09-15 NOTE — Progress Notes (Signed)
Received tele alert for vtach. MD Bensemon on unit and reviewed strip. No new orders at this time.

## 2013-09-15 NOTE — Progress Notes (Signed)
Report given to Marylene Land, RN for patient to be transferred into 2H23.

## 2013-09-15 NOTE — Progress Notes (Signed)
  I have seen and examined the patient, and reviewed the daily progress note by Enis Gash, MS 3 and discussed the care of the patient with them. Please see my progress note from 09/15/2013 for further details regarding assessment and plan.    Signed:  Heywood Iles, MD 09/15/2013, 5:12 PM

## 2013-09-15 NOTE — Progress Notes (Signed)
Subjective: [name] reports improved LLQ pain that is tender only to pressure.  He reports 5 episodes of watery green diarrhea overnight and one episode of vomiting.  Reports dyspnea while lying in bed, but denies fever/chills, melena, hemoptysis, hematochezia.   Objective: Vital signs in last 24 hours: Filed Vitals:   09/15/13 0500 09/15/13 0744 09/15/13 1006 09/15/13 1100  BP: 109/82 113/79  102/73  Pulse: 99  47 94  Temp: 98 F (36.7 C) 97.9 F (36.6 C)    TempSrc: Oral Oral    Resp: 18 17    Weight: 104.781 kg (231 lb)     SpO2: 94% 99%  100%   Weight change:   Intake/Output Summary (Last 24 hours) at 09/15/13 1111 Last data filed at 09/14/13 1700  Gross per 24 hour  Intake    100 ml  Output      0 ml  Net    100 ml   General appearance: cooperative, fatigued Lungs: clear to auscultation bilaterally; no crackles, rales, or ronchi Heart: RRR; s1, S2 normal; +S3; 2/6 systolic murmur LLSB, JVP  Abdomen: soft, tender to palpation with increased pain in LLQ; bowel sounds normal; no masses, no organomegaly. Extremities: +2 pitting edema in legs bilaterally; swollen, enlarged ankles; 2+ PT/DP pulses Neurologic: alert and oriented to general circumstances  Lab Results: Basic Metabolic Panel:  Recent Labs Lab 09/14/13 1203  09/14/13 1923 09/15/13 0105  NA 127*  --   --  129*  K 6.5*  < > 5.6* 5.1  CL 90*  --   --  91*  CO2 20  --   --  19  GLUCOSE 212*  --  179* 199*  BUN 50*  --   --  53*  CREATININE 2.84*  --   --  3.10*  CALCIUM 9.5  --   --  9.8  < > = values in this interval not displayed.  Liver Function Tests:  Recent Labs Lab 09/14/13 1203 09/15/13 0105  AST 21 25  ALT 10 9  ALKPHOS 91 83  BILITOT 3.1* 2.8*  PROT 7.8 7.7  ALBUMIN 3.6 3.4*    Recent Labs Lab 09/14/13 1203 09/14/13 1923  LIPASE 70* 61*   CBC:  Recent Labs Lab 09/08/13 2223 09/14/13 1203 09/15/13 0105  WBC 3.8* 4.3 4.1  NEUTROABS 2.5 2.9  --   HGB 14.8 16.9 15.7    HCT 45.0 50.7 45.7  MCV 82.9 85.1 83.2  PLT 185 194 183   Cardiac Enzymes:  Recent Labs Lab 09/14/13 1923 09/15/13 0105 09/15/13 0732  TROPONINI <0.30 <0.30 <0.30   BNP:  Recent Labs Lab 09/08/13 2224 09/14/13 1203  PROBNP 6632.0* 10023*   CBG:  Recent Labs Lab 09/14/13 1028 09/14/13 1653 09/14/13 1744 09/14/13 2043 09/15/13 0746  GLUCAP 178* 489* 164* 233* 161*   Urinalysis: Recent Labs Lab 09/09/13 0342 09/14/13 2359  COLORURINE AMBER* ORANGE*  LABSPEC 1.016 1.018  PHURINE 5.0 5.0  GLUCOSEU NEGATIVE NEGATIVE  HGBUR TRACE* TRACE*  BILIRUBINUR SMALL* MODERATE*  KETONESUR NEGATIVE NEGATIVE  PROTEINUR >300* >300*  UROBILINOGEN 1.0 1.0  NITRITE NEGATIVE NEGATIVE  LEUKOCYTESUR NEGATIVE NEGATIVE    Micro Results: No results found for this or any previous visit (from the past 240 hour(s)). Studies/Results: Ct Abdomen Pelvis Wo Contrast  09/14/2013   CLINICAL DATA:  Nausea and vomiting for 4 weeks. Diarrhea starting 5 days ago. Some vomiting of blood. Left lower quadrant abdominal pain.  EXAM: CT ABDOMEN AND PELVIS WITHOUT CONTRAST  TECHNIQUE:  Multidetector CT imaging of the abdomen and pelvis was performed following the standard protocol without IV contrast.  COMPARISON:  08/15/2009  FINDINGS: Mild atelectasis in the lung bases.  Cardiac enlargement.  Probable hepatic cirrhosis with enlarged left lateral segment and nodular contour. Small amount of ascites in the upper abdomen around the liver, extending along both pericolic gutters, and into the pelvis. Increased density in the gallbladder suggesting sludge. Unenhanced appearance of the spleen, pancreas, adrenal glands, kidneys, abdominal aorta, inferior vena cava, and retroperitoneal lymph nodes is unremarkable stomach and small bowel are unremarkable. Contrast material flows through to the colon without evidence of obstruction. Colon is not abnormally distended. Edema throughout the subcutaneous fat. Mild  mesenteric edema. No free air in the abdomen.  Pelvis: Wall thickening in the sigmoid colon with diverticulosis. Probable early diverticulitis. No abscess. No significant pelvic lymphadenopathy. Prostate gland is not enlarged. Mild degenerative changes in the spine. No destructive bone lesions.  IMPRESSION: Diverticulosis of the colon with mild thickening of the sigmoid colon suggesting early diverticulitis. No abscess. Hepatic cirrhosis with mild ascites and edema throughout the subcutaneous soft tissues. Cardiac enlargement.   Electronically Signed   By: Burman Nieves M.D.   On: 09/14/2013 23:51   Dg Chest 2 View  09/15/2013   CLINICAL DATA:  Chest pain.  EXAM: CHEST  2 VIEW  COMPARISON:  06/28/2013  FINDINGS: Cardiac pacemaker. Cardiac enlargement without vascular congestion. Mild perihilar infiltration suggesting edema. Appearance is improved since previous study. No blunting of costophrenic angles. No pneumothorax.  IMPRESSION: Cardiac enlargement with mild perihilar edema.   Electronically Signed   By: Burman Nieves M.D.   On: 09/15/2013 00:08   Medications: I have reviewed the patient's current medications. Scheduled Meds: . aspirin EC  81 mg Oral Daily  . atorvastatin  80 mg Oral q1800  . digoxin  0.125 mg Oral Daily  . famotidine  20 mg Oral Daily  . furosemide  80 mg Intravenous BID  . gabapentin  200 mg Oral BID  . heparin  5,000 Units Subcutaneous 3 times per day  . hydrALAZINE  25 mg Oral 3 times per day  . insulin aspart  0-9 Units Subcutaneous TID WC  . insulin glargine  13 Units Subcutaneous QHS  . sodium chloride  3 mL Intravenous Q12H  . sodium chloride  3 mL Intravenous Q12H   Continuous Infusions: . 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   HTN (hypertension)   Acute renal failure superimposed on stage 3 chronic kidney disease   HLD (hyperlipidemia)   Cocaine abuse   CAD (coronary artery disease)   Acute on chronic systolic heart failure   Hyperkalemia   Nausea with vomiting   Diarrhea   Acute on chronic renal failure  [name] is a 48 year old male with poorly controlled DM2 (A1c 13.5), CKD Stage 3, chronic systolic non-ischemic CHF (EF 16%, 07/18/12) secondary to alcoholism, and cocaine abuse who is admitted from clinic for abdominal pain, nausea, vomiting, and diarrhea found to have hyperkalemia and acute kidney injury.   Abdominal pain, nausea, vomiting, diarrhea: Abdominal CT showed diverticulosis of colon w/mild thickening of sigmoid colon suggestive of early diverticulitis, which is consistent with LLQ pain.  Hepatic cirrhosis w/mild ascites and edema throughout soft tissues also observed.  Patient remains  afebrile, but given N/V and watery diarrhea, infectious etiology also possible.  Will assess for C diff.   -Order C diff -Advance diet as tolerated  -Check orthostatic vital signs   Hyperkalemia: K 6.5 on admission possibly from excessive K supplementation at home. K is 5.1 today.  Troponins negative x3. EKG today shows resolving peaked T-waves.  Telemetry shows frequent short runs polymorphic NSVT Will continue to monitor and give kayexelate if needed.  He denies chest pain at this time.  -Repeat BMP tomorrow AM -Monitor on telemetry   Acute-on-chronic kidney disease: Crt 2.84 on admission w/ a baseline 2.5. Crt has increased to 3.10 today in setting of dark urine and cold extremities bilaterally.  Most likely due to poor perfusion.  Pt appears hypervolemic (2/2 to CHF with EF 10%) with 2+ pitting edema to the calf, as well as pulmonary edema on CXR.  No crackles auscultated on exam.  Consulted HF team, and per their rec, will start milrinone 0.25 mcg/kg/min and will replace torsemide with IV lasix.    -Replace torsemide  BID w/IV Lasix 80 mg BID for diuresis  -Gave one dose of metolazone 2.5mg  last night 8/28 -Held digoxin 0.125mg  this AM due to pulse 47.  Pulse now 99.   -Placed central line for co-ox and CVP monitoring  -Check Fe urea   CAD: Continue atorvastatin .   HTN: Stable. Continue hydralazine  q8h.   DM2: Continue on sensitive SSI.   GERD: Continue famotidine .   Asthma: Continue albuterol 2.5mg  q2h prn for wheezing.   FEN:  -Diet: Clear liquids   DVT prophylaxis: Heparin 5000 units subcutaneous   CODE STATUS: FULL CODE.  Discussed patient's end-of-life decisions regarding medical care.  [name] informed of likely poor outcome if resuscitation and intubation were given.  Pt confirmed full code status.    Dispo: Disposition is deferred at this time, awaiting improvement of current medical problems. Anticipated discharge in approximately 1-2 day(s).  The patient does have a current PCP Baltazar Apo, MD) and does need an [facility] hospital follow-up appointment after discharge.  The patient does not know have transportation limitations that hinder transportation to clinic appointments.   This is a Psychologist, occupational Note.  The care of the patient was discussed with Dr. Heywood Iles and the assessment and plan formulated with their assistance.  Please see their attached note for official documentation of the daily encounter.   LOS: 1 day   [place] B Niyah Mamaril, Med Student 09/15/2013, 11:11 AM

## 2013-09-16 LAB — BASIC METABOLIC PANEL
ANION GAP: 16 — AB (ref 5–15)
Anion gap: 19 — ABNORMAL HIGH (ref 5–15)
BUN: 53 mg/dL — ABNORMAL HIGH (ref 6–23)
BUN: 51 mg/dL — ABNORMAL HIGH (ref 6–23)
CHLORIDE: 91 meq/L — AB (ref 96–112)
CO2: 21 mEq/L (ref 19–32)
CO2: 22 mEq/L (ref 19–32)
CREATININE: 3.11 mg/dL — AB (ref 0.50–1.35)
CREATININE: 2.56 mg/dL — AB (ref 0.50–1.35)
Calcium: 8.6 mg/dL (ref 8.4–10.5)
Calcium: 8.7 mg/dL (ref 8.4–10.5)
Chloride: 89 mEq/L — ABNORMAL LOW (ref 96–112)
GFR calc non Af Amer: 22 mL/min — ABNORMAL LOW (ref >90)
GFR, EST AFRICAN AMERICAN: 26 mL/min — AB (ref >90)
GFR, EST AFRICAN AMERICAN: 33 mL/min — AB (ref >90)
GFR, EST NON AFRICAN AMERICAN: 28 mL/min — AB (ref >90)
GLUCOSE: 161 mg/dL — AB (ref 70–99)
Glucose, Bld: 242 mg/dL — ABNORMAL HIGH (ref 70–99)
POTASSIUM: 4 meq/L (ref 3.7–5.3)
Potassium: 4.5 mEq/L (ref 3.7–5.3)
Sodium: 131 mEq/L — ABNORMAL LOW (ref 137–147)
Sodium: 127 mEq/L — ABNORMAL LOW (ref 137–147)

## 2013-09-16 LAB — CBC
HCT: 44.4 % (ref 39.0–52.0)
HCT: 43.2 % (ref 39.0–52.0)
Hemoglobin: 15.2 g/dL (ref 13.0–17.0)
Hemoglobin: 14.8 g/dL (ref 13.0–17.0)
MCH: 28.6 pg (ref 26.0–34.0)
MCH: 28.5 pg (ref 26.0–34.0)
MCHC: 34.2 g/dL (ref 30.0–36.0)
MCHC: 34.3 g/dL (ref 30.0–36.0)
MCV: 83.5 fL (ref 78.0–100.0)
MCV: 83.2 fL (ref 78.0–100.0)
PLATELETS: 194 10*3/uL (ref 150–400)
Platelets: 183 10*3/uL (ref 150–400)
RBC: 5.32 MIL/uL (ref 4.22–5.81)
RBC: 5.19 MIL/uL (ref 4.22–5.81)
RDW: 17.3 % — AB (ref 11.5–15.5)
RDW: 16.9 % — AB (ref 11.5–15.5)
WBC: 5.2 10*3/uL (ref 4.0–10.5)
WBC: 5 10*3/uL (ref 4.0–10.5)

## 2013-09-16 LAB — CARBOXYHEMOGLOBIN
CARBOXYHEMOGLOBIN: 1.8 % — AB (ref 0.5–1.5)
METHEMOGLOBIN: 0.3 % (ref 0.0–1.5)
O2 SAT: 69.7 %
TOTAL HEMOGLOBIN: 15.4 g/dL (ref 13.5–18.0)

## 2013-09-16 LAB — MAGNESIUM: Magnesium: 2.1 mg/dL (ref 1.5–2.5)

## 2013-09-16 LAB — GLUCOSE, CAPILLARY
GLUCOSE-CAPILLARY: 173 mg/dL — AB (ref 70–99)
GLUCOSE-CAPILLARY: 207 mg/dL — AB (ref 70–99)
Glucose-Capillary: 258 mg/dL — ABNORMAL HIGH (ref 70–99)
Glucose-Capillary: 262 mg/dL — ABNORMAL HIGH (ref 70–99)

## 2013-09-16 MED ORDER — INSULIN ASPART 100 UNIT/ML ~~LOC~~ SOLN
0.0000 [IU] | Freq: Three times a day (TID) | SUBCUTANEOUS | Status: DC
  Administered 2013-09-16 – 2013-09-19 (×9): 11 [IU] via SUBCUTANEOUS
  Administered 2013-09-19: 7 [IU] via SUBCUTANEOUS
  Administered 2013-09-20: 4 [IU] via SUBCUTANEOUS
  Administered 2013-09-20 (×2): 15 [IU] via SUBCUTANEOUS
  Administered 2013-09-21: 11 [IU] via SUBCUTANEOUS
  Administered 2013-09-21 (×2): 7 [IU] via SUBCUTANEOUS
  Administered 2013-09-22: 4 [IU] via SUBCUTANEOUS
  Administered 2013-09-22: 11 [IU] via SUBCUTANEOUS
  Administered 2013-09-22: 7 [IU] via SUBCUTANEOUS
  Administered 2013-09-23: 11 [IU] via SUBCUTANEOUS
  Administered 2013-09-23: 3 [IU] via SUBCUTANEOUS
  Administered 2013-09-23: 4 [IU] via SUBCUTANEOUS
  Administered 2013-09-24: 3 [IU] via SUBCUTANEOUS
  Administered 2013-09-24 (×2): 20 [IU] via SUBCUTANEOUS
  Administered 2013-09-25: 4 [IU] via SUBCUTANEOUS
  Administered 2013-09-25: 7 [IU] via SUBCUTANEOUS
  Administered 2013-09-25: 4 [IU] via SUBCUTANEOUS
  Administered 2013-09-26 (×2): 7 [IU] via SUBCUTANEOUS
  Administered 2013-09-26: 3 [IU] via SUBCUTANEOUS
  Administered 2013-09-27 (×2): 7 [IU] via SUBCUTANEOUS
  Administered 2013-09-28 (×3): 4 [IU] via SUBCUTANEOUS
  Administered 2013-09-29: 7 [IU] via SUBCUTANEOUS
  Administered 2013-09-29: 3 [IU] via SUBCUTANEOUS
  Administered 2013-09-29: 15 [IU] via SUBCUTANEOUS
  Administered 2013-09-30: 20 [IU] via SUBCUTANEOUS
  Administered 2013-09-30: 11 [IU] via SUBCUTANEOUS
  Administered 2013-09-30: 15 [IU] via SUBCUTANEOUS
  Administered 2013-10-01: 7 [IU] via SUBCUTANEOUS
  Administered 2013-10-01 (×2): 4 [IU] via SUBCUTANEOUS
  Administered 2013-10-02: 3 [IU] via SUBCUTANEOUS
  Administered 2013-10-02 (×2): 4 [IU] via SUBCUTANEOUS
  Administered 2013-10-03: 15 [IU] via SUBCUTANEOUS
  Administered 2013-10-03: 4 [IU] via SUBCUTANEOUS
  Administered 2013-10-04 (×2): 3 [IU] via SUBCUTANEOUS
  Administered 2013-10-04: 4 [IU] via SUBCUTANEOUS
  Administered 2013-10-05: 7 [IU] via SUBCUTANEOUS
  Administered 2013-10-05: 3 [IU] via SUBCUTANEOUS
  Administered 2013-10-06 (×2): 11 [IU] via SUBCUTANEOUS
  Administered 2013-10-07: 20 [IU] via SUBCUTANEOUS
  Administered 2013-10-07: 7 [IU] via SUBCUTANEOUS
  Administered 2013-10-08 (×2): 4 [IU] via SUBCUTANEOUS

## 2013-09-16 MED ORDER — INSULIN ASPART 100 UNIT/ML ~~LOC~~ SOLN
0.0000 [IU] | Freq: Every day | SUBCUTANEOUS | Status: DC
  Administered 2013-09-16 – 2013-09-17 (×2): 3 [IU] via SUBCUTANEOUS
  Administered 2013-09-19: 4 [IU] via SUBCUTANEOUS
  Administered 2013-09-21: 2 [IU] via SUBCUTANEOUS
  Administered 2013-09-22: 5 [IU] via SUBCUTANEOUS
  Administered 2013-09-23: 4 [IU] via SUBCUTANEOUS
  Administered 2013-09-26: 2 [IU] via SUBCUTANEOUS
  Administered 2013-09-27 – 2013-09-29 (×2): 5 [IU] via SUBCUTANEOUS
  Administered 2013-10-01 – 2013-10-05 (×2): 4 [IU] via SUBCUTANEOUS
  Administered 2013-10-06: 20 [IU] via SUBCUTANEOUS

## 2013-09-16 MED ORDER — FUROSEMIDE 10 MG/ML IJ SOLN
80.0000 mg | Freq: Once | INTRAMUSCULAR | Status: AC
  Administered 2013-09-16: 80 mg via INTRAVENOUS

## 2013-09-16 MED ORDER — METOLAZONE 5 MG PO TABS
5.0000 mg | ORAL_TABLET | Freq: Every day | ORAL | Status: DC
  Administered 2013-09-16 – 2013-09-17 (×2): 5 mg via ORAL
  Filled 2013-09-16 (×3): qty 1

## 2013-09-16 MED ORDER — PREDNISONE 20 MG PO TABS
40.0000 mg | ORAL_TABLET | Freq: Every day | ORAL | Status: AC
  Administered 2013-09-16 – 2013-09-18 (×3): 40 mg via ORAL
  Filled 2013-09-16 (×3): qty 2

## 2013-09-16 MED ORDER — INSULIN ASPART 100 UNIT/ML ~~LOC~~ SOLN: 0.0000 [IU] | Freq: Three times a day (TID) | SUBCUTANEOUS | Status: DC

## 2013-09-16 MED ORDER — FUROSEMIDE 10 MG/ML IJ SOLN
15.0000 mg/h | INTRAVENOUS | Status: DC
  Administered 2013-09-16 – 2013-09-22 (×10): 15 mg/h via INTRAVENOUS
  Filled 2013-09-16 (×22): qty 25

## 2013-09-16 NOTE — Consult Note (Signed)
Advanced Heart Failure Team Consult Note   ADVANCED HF TEAM ROUNDING NOTE  HPI:    [name] is a 48y/o male with HTN, DM2, polysunbstance abuse with longstanding HF due to NICM (probably ETOH), EF 10%. Current cocaine abuse. Was previously on [facility].  Admitted with ab pain & n/v/diarrhea. Weight up to 231. No response to oral diuretics. + edema, fatigue. Multiple runs NSVT. Cr 2.11->2.84-3.10  Noncontrast CT ab: Diverticulosis of the colon with mild thickening of the sigmoid colon suggesting early diverticulitis. No abscess. Hepatic cirrhosis with mild ascites and edema throughout the subcutaneous soft tissues.  Started on milrinone yesterday after co-ox 51. Now 70%. Belly feeling much better. U/o remains sluggish on bolus lasix. C/o L elbow pain.   Objective:    Vital Signs:   Temp:  [97.2 F (36.2 C)-97.7 F (36.5 C)] 97.7 F (36.5 C) (08/30 0812) Pulse Rate:  [62-112] 112 (08/30 1100) Resp:  [16-18] 18 (08/30 0800) BP: (102-123)/(70-93) 121/82 mmHg (08/30 1100) SpO2:  [94 %-100 %] 94 % (08/30 1100) Weight:  [106.8 kg (235 lb 7.2 oz)] 106.8 kg (235 lb 7.2 oz) (08/30 0402) Last BM Date: 09/14/13 Filed Weights   09/15/13 0500 09/16/13 0402  Weight: 104.781 kg (231 lb) 106.8 kg (235 lb 7.2 oz)    Physical Exam: CVP 17 General: Chronically ill appearing. No resp difficulty;  HEENT: normal  Neck: supple. RIJ central line. JVP jaw. Carotids 2+ bilaterally; no bruits. No lymphadenopathy or thryomegaly appreciated.  Cor: PMI normal. Regular rate & rhythm. 2/6 systolic murmur LLSB. + S3  Lungs: clear  Abdomen: obese, soft, nontender, + distended. No hepatosplenomegaly. No bruits or masses. Good bowel sounds.  Extremities: no cyanosis, clubbing, rash. 3+ edema.  L elbow sore to touch swollen Neuro: alert & orientedx3, cranial nerves grossly intact. Moves all 4 extremities w/o difficulty. Affect pleasant.when   Telemetry: SR with frequent short runs polymorphic  NSVT  Labs: Basic Metabolic Panel:  Recent Labs Lab 09/14/13 1203 09/14/13 1447 09/14/13 1923 09/15/13 0105 09/16/13 0425  NA 127*  --   --  129* 131*  K 6.5* 6.5* 5.6* 5.1 4.0  CL 90*  --   --  91* 91*  CO2 20  --   --  19 21  GLUCOSE 212*  --  179* 199* 161*  BUN 50*  --   --  53* 53*  CREATININE 2.84*  --   --  3.10* 3.11*  CALCIUM 9.5  --   --  9.8 8.6  MG  --   --   --   --  2.1    Liver Function Tests:  Recent Labs Lab 09/14/13 1203 09/15/13 0105  AST 21 25  ALT 10 9  ALKPHOS 91 83  BILITOT 3.1* 2.8*  PROT 7.8 7.7  ALBUMIN 3.6 3.4*    Recent Labs Lab 09/14/13 1203 09/14/13 1923  LIPASE 70* 61*   No results found for this basename: AMMONIA,  in the last 168 hours  CBC:  Recent Labs Lab 09/14/13 1203 09/15/13 0105 09/16/13 0425  WBC 4.3 4.1 5.2  NEUTROABS 2.9  --   --   HGB 16.9 15.7 15.2  HCT 50.7 45.7 44.4  MCV 85.1 83.2 83.5  PLT 194 183 183    Cardiac Enzymes:  Recent Labs Lab 09/14/13 1203 09/14/13 1923 09/15/13 0105 09/15/13 0732  TROPONINI <0.30 <0.30 <0.30 <0.30    BNP: BNP (last 3 results)  Recent Labs  07/12/13 1344 09/08/13 2224 09/14/13 1203  PROBNP 5773.0* 6632.0* 10023*    CBG:  Recent Labs Lab 09/15/13 0746 09/15/13 1236 09/15/13 1618 09/15/13 2124 09/16/13 0816  GLUCAP 161* 170* 207* 188* 173*    Coagulation Studies: No results found for this basename: LABPROT, INR,  in the last 72 hours  Other results: EKG:NSR 95 IVCD No ST-T wave abnormalities.    Imaging: Ct Abdomen Pelvis Wo Contrast  09/14/2013   CLINICAL DATA:  Nausea and vomiting for 4 weeks. Diarrhea starting 5 days ago. Some vomiting of blood. Left lower quadrant abdominal pain.  EXAM: CT ABDOMEN AND PELVIS WITHOUT CONTRAST  TECHNIQUE: Multidetector CT imaging of the abdomen and pelvis was performed following the standard protocol without IV contrast.  COMPARISON:  08/15/2009  FINDINGS: Mild atelectasis in the lung bases.  Cardiac  enlargement.  Probable hepatic cirrhosis with enlarged left lateral segment and nodular contour. Small amount of ascites in the upper abdomen around the liver, extending along both pericolic gutters, and into the pelvis. Increased density in the gallbladder suggesting sludge. Unenhanced appearance of the spleen, pancreas, adrenal glands, kidneys, abdominal aorta, inferior vena cava, and retroperitoneal lymph nodes is unremarkable stomach and small bowel are unremarkable. Contrast material flows through to the colon without evidence of obstruction. Colon is not abnormally distended. Edema throughout the subcutaneous fat. Mild mesenteric edema. No free air in the abdomen.  Pelvis: Wall thickening in the sigmoid colon with diverticulosis. Probable early diverticulitis. No abscess. No significant pelvic lymphadenopathy. Prostate gland is not enlarged. Mild degenerative changes in the spine. No destructive bone lesions.  IMPRESSION: Diverticulosis of the colon with mild thickening of the sigmoid colon suggesting early diverticulitis. No abscess. Hepatic cirrhosis with mild ascites and edema throughout the subcutaneous soft tissues. Cardiac enlargement.   Electronically Signed   By: Burman Nieves M.D.   On: 09/14/2013 23:51   Dg Chest 2 View  09/15/2013   CLINICAL DATA:  Chest pain.  EXAM: CHEST  2 VIEW  COMPARISON:  06/28/2013  FINDINGS: Cardiac pacemaker. Cardiac enlargement without vascular congestion. Mild perihilar infiltration suggesting edema. Appearance is improved since previous study. No blunting of costophrenic angles. No pneumothorax.  IMPRESSION: Cardiac enlargement with mild perihilar edema.   Electronically Signed   By: Burman Nieves M.D.   On: 09/15/2013 00:08   Dg Chest Port 1 View  09/15/2013   CLINICAL DATA:  Status post central line placement  EXAM: PORTABLE CHEST - 1 VIEW  COMPARISON:  09/14/2013  FINDINGS: A defibrillator is noted on the left. A right jugular central line is noted in the  mid to distal superior vena cava. No pneumothorax is seen. Mild interstitial changes are again identified. No focal infiltrate is seen.  IMPRESSION: No pneumothorax following central line placement.   Electronically Signed   By: Alcide Clever M.D.   On: 09/15/2013 12:38        Assessment:   1. A/c systolic HF with low output physiology    --d/c weight in 6/15 was 206 pounds 2. NICM EF 10-15% 3. A/c renal failure, stage IV (baseline Cr ~2.0-2.5) 4. Hyperkalemia 5. NSVT 6. N/V/diarrhea - w/u for C. Diff in progress 7. H/o polysubstance abuse 8. Noncompliance 9. Hyponatremia 10. Acute gout in L elbow  Plan/Discussion:    He has low output HF. His weight is up about 25 pounds from baseline. Co-ox improved and renal function stabilized on milrinone. U/o sluggish. Change to lasix gtt. Add metolazone. Place TED hose. Prednisone burst for gout.  and renal function is getting  worse. NSVT is common for him. Keep K >= 4.0. Mg > = 2.0. No b-blocker due to low output. No ACE due to renal failure. Dig level ok.  Length of Stay: 2   Arvilla Meres MD 09/16/2013, 12:14 PM  Advanced Heart Failure Team Pager [phone] (M-F; 7a - 4p)  Please contact [facility] for night-coverage after hours (4p -7a ) and weekends on [email or web address]

## 2013-09-16 NOTE — Progress Notes (Signed)
I have seen the patient and reviewed the daily progress note by Jonathan B Hanvey MS 3 and discussed the care of the patient with them.  See below for documentation of my findings, assessment, and plans.  Subjective: He had 7 episodes of non bloody watery diarrhea lyesterday but no BM today. Had 9/10 back and left side pain with cramping. EKG repeated and with no changes. Complains of left arm pain this am that is present mostly in the forearm at the site of peripheral IV that has been removes. Has dyspnea with activity. Reports increased urine output but not as much.   Objective: Vital signs in last 24 hours: Filed Vitals:   09/16/13 1200 09/16/13 1300 09/16/13 1400 09/16/13 1500  BP: 186/147 126/91 119/72 129/91  Pulse: 109 119 114 107  Temp:      TempSrc:      Resp:      Height:      Weight:      SpO2: 98% 99% 97% 94%   Weight change: 4 lb 7.2 oz (2.019 kg)  Intake/Output Summary (Last 24 hours) at 09/16/13 1643 Last data filed at 09/16/13 1500  Gross per 24 hour  Intake 1242.2 ml  Output   2225 ml  Net -982.8 ml  Vitals Reviewed General appearance: cooperative, fatigued  Lungs: clear to auscultation bilaterally; no crackles, rales, or ronchi  Heart: RRR, 2/6 systolic murmur LLSB, JVD+  Abdomen: soft, tender to palpation with increased pain in LLQ; bowel sounds normal; no masses, no organomegaly.  Extremities: 2+ pitting edema in legs bilaterally; swollen ankles; 2+ PT/DP pulses. Let forearm and elbow TTP with mild edema but no erythema, ROM somewhat limited.  Neurologic: alert and oriented x3, follows commands appropriately, moves all extremities voluntairly  Lab Results: Reviewed and documented in Electronic Record Micro Results: Reviewed and documented in Electronic Record Studies/Results: Reviewed and documented in Electronic Record Medications: I have reviewed the patient's current medications. Scheduled Meds: . aspirin EC  81 mg Oral Daily  . atorvastatin  80 mg  Oral q1800  . digoxin  0.125 mg Oral Daily  . famotidine  20 mg Oral Daily  . gabapentin  200 mg Oral BID  . heparin  5,000 Units Subcutaneous 3 times per day  . hydrALAZINE  25 mg Oral 3 times per day  . insulin aspart  0-9 Units Subcutaneous TID WC  . insulin glargine  13 Units Subcutaneous QHS  . metolazone  5 mg Oral Daily  . predniSONE  40 mg Oral Q breakfast  . sodium chloride  3 mL Intravenous Q12H   Continuous Infusions: . furosemide (LASIX) infusion 15 mg/hr (09/16/13 1305)  . milrinone 0.25 mcg/kg/min (09/16/13 1314)   PRN Meds:.sodium chloride, albuterol, LORazepam, morphine injection, ondansetron (ZOFRAN) IV, oxyCODONE, promethazine, sodium chloride Assessment/Plan: [name] is a 48 year old man with poorly controlled DM2 (HgbA1c 13.5%), CKD Stage 3, chronic systolic non-ischemic CHF with AICD implant (EF 10%, 07/18/12)  2/2 polysubstance abuse (EtOH, cocaine) hospitalized for possible early diverticulitis and cardiorenal syndrome.   #Acute on Chronic systolic heart failure: Most recent dry weight in June was 206 lbs, was 2031 lbs on presentation, it is 235lbs today with s/s of volume overload (DOE, bl LE edema, increased JVD) in setting of ongoing diarrhea and despite IV Lasix with low UOP net ~0.5L diuresis. Has multiple runs of NSVT.  -Heart Failure team, appreciate help managing this patient: continue milrinone, start Lasix gtt and metolazone, central line for to assess CVP &  CO-Hb  -Avoid BB due to low output -Avoid ACEi due to a/c RF -TED hose  #Left arm pain: Likely gout attack. Not a candidate for NSAIDs with CKD, prednisone may lead to rise in CBG, fluid retention but ok to start per HF team.  -Started on prednisone  daily x3 days   #Cardiorenal syndrome: Crt plateau at 3.1 from baseline of 2.5. Continues to have dark urine with cold extremities but UOP increased >05/ml/kg/hr with IV Lasix.  -BMET in am -strict I&O -Lasix gtt and metolazone per  above -Nephrology consult if Cr not improving  #Possible early diverticulitis: Ab CT with diverticulosis of colon with mild thickening of sigmoid colon suggestive of early diverticulitis and consistent with LLQ pain. His pain and diarrhea are improving. C. Diff negative.  -Serial abd exam -Consider Abx if pain worsens or if he decompensates  -Phenergan 12.5mg  prn for nausea/vomiting  -f/u stool pathogen   #CAD: Continue home atorvastatin  daily  #HTN: Continue hydralazine  q8h  #DM2: Continue on sensitive SSI.   #GERD: Continue famotidine .   #Asthma: Continue albuterol 2.5mg  q2h prn for wheezing.   #FEN:  -Diet: carb modified low sodium  #DVT prophylaxis: Heparin 5000 units TID subcutaneous   #CODE STATUS: FULL CODE. -Discussed with him the likelihood of poor outcome with resuscitation and intubation and he acknowledged but he wants to remains Full code despite being under hospice care as outpatient.   Dispo: Disposition is deferred at this time, awaiting improvement of current medical problems. Anticipated discharge in approximately 1-2 day(s).   The patient does have a current PCP Baltazar Apo, MD) and does need an [facility] hospital follow-up appointment after discharge.   The patient does not know have transportation limitations that hinder transportation to clinic appointments.  .Services Needed at time of discharge: Y = Yes, Blank = No PT:   OT:   RN:   Equipment:   Other:     LOS: 2 days   Jonathan Barban, MD 09/16/2013, 4:43 PM

## 2013-09-16 NOTE — Progress Notes (Signed)
Subjective: [name] reports that LLQ pain is continuing to improve and is tender only to pressure.  He reports 7 episodes of watery diarrhea overnight, but denies vomiting.  Overnight, reported 9/10 back and side pain with some chest cramping.  EKG was obtained, but no new changes were note.  This AM, patients reports new 10/10 "dull, crampy" left arm pain that began last night around 6 pm and extends from left shoulder to wrist.  Denies numbness or tingling in extremities.  Reports some dyspnea while sitting in bed, but denies fever/chills, melena, hemoptysis, hematochezia.   Objective: Vital signs in last 24 hours: Filed Vitals:   09/16/13 0402 09/16/13 0812 09/16/13 0840 09/16/13 0953  BP: 116/71 109/70 119/93   Pulse: 107 62  105  Temp: 97.4 F (36.3 C) 97.7 F (36.5 C)    TempSrc: Oral Oral    Resp: 16     Height:      Weight: 106.8 kg (235 lb 7.2 oz)     SpO2: 98% 99%     Weight change: 2.019 kg (4 lb 7.2 oz)  Intake/Output Summary (Last 24 hours) at 09/16/13 1105 Last data filed at 09/16/13 0918  Gross per 24 hour  Intake 1063.1 ml  Output   1525 ml  Net -461.9 ml   General appearance: cooperative, fatigued Lungs: clear to auscultation bilaterally; no crackles, rales, or ronchi Heart: RRR; S1, S2 normal; +S3; systolic murmur LLSB Abdomen: soft, tender to palpation with mild pain in LLQ; bowel sounds normal; no masses, no organomegaly. Extremities: +2 pitting edema to thighs bilaterally; swollen, enlarged ankles; cold extremities; 2+ PT/DP pulses Neurologic: alert and oriented to general circumstances; strength 5/5 in left arm biceps, triceps, flexor carpi ulnaris, extensor carpi radialis, and finger abduction/adduction  Lab Results: Basic Metabolic Panel:  Recent Labs Lab 09/15/13 0105 09/16/13 0425  NA 129* 131*  K 5.1 4.0  CL 91* 91*  CO2 19 21  GLUCOSE 199* 161*  BUN 53* 53*  CREATININE 3.10* 3.11*  CALCIUM 9.8 8.6  MG  --  2.1    Liver Function  Tests:  Recent Labs Lab 09/14/13 1203 09/15/13 0105  AST 21 25  ALT 10 9  ALKPHOS 91 83  BILITOT 3.1* 2.8*  PROT 7.8 7.7  ALBUMIN 3.6 3.4*    Recent Labs Lab 09/14/13 1203 09/14/13 1923  LIPASE 70* 61*   CBC:  Recent Labs Lab 09/14/13 1203 09/15/13 0105 09/16/13 0425  WBC 4.3 4.1 5.2  NEUTROABS 2.9  --   --   HGB 16.9 15.7 15.2  HCT 50.7 45.7 44.4  MCV 85.1 83.2 83.5  PLT 194 183 183   Cardiac Enzymes:  Recent Labs Lab 09/14/13 1923 09/15/13 0105 09/15/13 0732  TROPONINI <0.30 <0.30 <0.30   BNP:  Recent Labs Lab 09/14/13 1203  PROBNP 10023*   CBG:  Recent Labs Lab 09/14/13 2043 09/15/13 0746 09/15/13 1236 09/15/13 1618 09/15/13 2124 09/16/13 0816  GLUCAP 233* 161* 170* 207* 188* 173*    Micro Results: Recent Results (from the past 240 hour(s))  MRSA PCR SCREENING     Status: Abnormal   Collection Time    09/15/13 11:01 AM      Result Value Ref Range Status   MRSA by PCR POSITIVE (*) NEGATIVE Final   Comment:            The GeneXpert MRSA Assay (FDA     approved for NASAL specimens     only), is one component of a  comprehensive MRSA colonization     surveillance program. It is not     intended to diagnose MRSA     infection nor to guide or     monitor treatment for     MRSA infections.     RESULT CALLED TO, READ BACK BY AND VERIFIED WITH:     BRODY J.,RN 09/15/13 1455 BY JONESJ  CLOSTRIDIUM DIFFICILE BY PCR     Status: None   Collection Time    09/15/13 11:15 AM      Result Value Ref Range Status   C difficile by pcr NEGATIVE  NEGATIVE Final   Medications: I have reviewed the patient's current medications. Scheduled Meds: . aspirin EC  81 mg Oral Daily  . atorvastatin  80 mg Oral q1800  . digoxin  0.125 mg Oral Daily  . famotidine  20 mg Oral Daily  . furosemide  80 mg Intravenous BID  . gabapentin  200 mg Oral BID  . heparin  5,000 Units Subcutaneous 3 times per day  . hydrALAZINE  25 mg Oral 3 times per day  .  insulin aspart  0-9 Units Subcutaneous TID WC  . insulin glargine  13 Units Subcutaneous QHS  . sodium chloride  3 mL Intravenous Q12H  . sodium chloride  3 mL Intravenous Q12H   Continuous Infusions: . 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   HTN (hypertension)   Acute renal failure superimposed on stage 3 chronic kidney disease   HLD (hyperlipidemia)   Cocaine abuse   CAD (coronary artery disease)   Acute on chronic systolic heart failure   Hyperkalemia   Nausea with vomiting   Diarrhea   Acute on chronic renal failure   Heart failure  [name] is a 48 year old male with poorly controlled DM2 (A1c 13.5), CKD Stage 3, chronic systolic non-ischemic CHF (EF 16%, 07/18/12) secondary to alcoholism, and cocaine abuse who is admitted from clinic for abdominal pain, nausea, vomiting, and diarrhea found to have hyperkalemia and acute kidney injury.   Abdominal pain, nausea, vomiting, diarrhea:  Diverticulitis is most likely etiology given LLQ pain and abdominal CT that showed mild thickening of sigmoid colon. Since pain is resolving, will start start empiric antibiotics for now.  Hepatitis panel unremarkable. Patient remains afebrile, but given watery diarrhea, infectious etiology also possible.  C diff was negative.  GI pathogen panel pending.   -Advance diet as tolerated  -Check orthostatic vital signs -Continue Phenergan 12.5mg  prn for nausea/vomiting   Cardiorenal syndrome: Crt 3.1 today, stable from yesterday but increased from baseline 2.5.  Wt increased 2kg. Continues to have cold extremities indicative of poor perfusion, though remains hypervolemic on exam as evidenced by 2+ peripheral edema and JVP. Fe urea 13.5%  further suggests pre-renal azotemia. HF team following. Appreciate their assistance.  -Continue milrinone 0.25 mcg/kg/min -Continue to assess CVP & CO-Hb via central line -Continue Lasix  IV BID  -Continue digoxin 0.125 mg  Hyperkalemia:  Resolved. K 4.0 this AM, down from 6.5 on admission. Will continue to monitor and give kayexelate if needed.   -Repeat BMP tomorrow AM -Monitor on telemetry   Arm pain: EKG overnight showed no changes.  Pt is already being managed on morphine  q6h PRN and oxycodone  q8h PRN for pain. No  other pain meds prescribed at this time. Pt has history of narcotic misuse/drug-seeking behavior. Will defer additional pain meds at this time.  Continue to monitor.  CAD: Continue atorvastatin 80mg .   HTN: Stable. Continue hydralazine 25mg  q8h.   DM2: Continue on sensitive SSI.   GERD: Continue famotidine 20mg .   Asthma: Continue albuterol 2.5mg  q2h prn for wheezing.   FEN:  -Diet: Clear liquids   DVT prophylaxis: Heparin 5000 units subcutaneous   CODE STATUS: FULL CODE.  Discussed patient's end-of-life decisions regarding medical care yesterday 8/29. Pt  confirmed full code status.   Dispo: Disposition is deferred at this time, awaiting improvement of current medical problems. Anticipated discharge in approximately 1-2 day(s).  The patient does have a current PCP Baltazar Apo, MD) and does need an [facility] hospital follow-up appointment after discharge.  The patient does not know have transportation limitations that hinder transportation to clinic appointments.   This is a Psychologist, occupational Note.  The care of the patient was discussed with Dr. Heywood Iles and the assessment and plan formulated with their assistance.  Please see their attached note for official documentation of the daily encounter.   LOS: 2 days   [place] B Anneke Cundy, Med Student 09/16/2013, 11:05 AM

## 2013-09-17 ENCOUNTER — Encounter: Payer: Self-pay | Admitting: Internal Medicine

## 2013-09-17 DIAGNOSIS — K5900 Constipation, unspecified: Secondary | ICD-10-CM

## 2013-09-17 DIAGNOSIS — M79609 Pain in unspecified limb: Secondary | ICD-10-CM

## 2013-09-17 DIAGNOSIS — I5023 Acute on chronic systolic (congestive) heart failure: Secondary | ICD-10-CM

## 2013-09-17 DIAGNOSIS — K7460 Unspecified cirrhosis of liver: Secondary | ICD-10-CM | POA: Diagnosis present

## 2013-09-17 LAB — CARBOXYHEMOGLOBIN
Carboxyhemoglobin: 1.7 % — ABNORMAL HIGH (ref 0.5–1.5)
METHEMOGLOBIN: 0.6 % (ref 0.0–1.5)
O2 Saturation: 83 %
TOTAL HEMOGLOBIN: 14.1 g/dL (ref 13.5–18.0)

## 2013-09-17 LAB — BASIC METABOLIC PANEL
Anion gap: 14 (ref 5–15)
BUN: 53 mg/dL — AB (ref 6–23)
CALCIUM: 8.8 mg/dL (ref 8.4–10.5)
CO2: 25 mEq/L (ref 19–32)
Chloride: 87 mEq/L — ABNORMAL LOW (ref 96–112)
Creatinine, Ser: 2.47 mg/dL — ABNORMAL HIGH (ref 0.50–1.35)
GFR, EST AFRICAN AMERICAN: 34 mL/min — AB (ref >90)
GFR, EST NON AFRICAN AMERICAN: 30 mL/min — AB (ref >90)
Glucose, Bld: 281 mg/dL — ABNORMAL HIGH (ref 70–99)
POTASSIUM: 4.2 meq/L (ref 3.7–5.3)
Sodium: 126 mEq/L — ABNORMAL LOW (ref 137–147)

## 2013-09-17 LAB — GLUCOSE, CAPILLARY
GLUCOSE-CAPILLARY: 268 mg/dL — AB (ref 70–99)
GLUCOSE-CAPILLARY: 300 mg/dL — AB (ref 70–99)
GLUCOSE-CAPILLARY: 290 mg/dL — AB (ref 70–99)
Glucose-Capillary: 261 mg/dL — ABNORMAL HIGH (ref 70–99)

## 2013-09-17 LAB — GI PATHOGEN PANEL BY PCR, STOOL
C DIFFICILE TOXIN A/B: NEGATIVE
CAMPYLOBACTER BY PCR: NEGATIVE
CRYPTOSPORIDIUM BY PCR: NEGATIVE
E COLI 0157 BY PCR: NEGATIVE
E coli (ETEC) LT/ST: NEGATIVE
E coli (STEC): NEGATIVE
G lamblia by PCR: NEGATIVE
Norovirus GI/GII: NEGATIVE
Rotavirus A by PCR: NEGATIVE
Salmonella by PCR: NEGATIVE
Shigella by PCR: NEGATIVE

## 2013-09-17 MED ORDER — CHLORHEXIDINE GLUCONATE CLOTH 2 % EX PADS
6.0000 | MEDICATED_PAD | Freq: Every day | CUTANEOUS | Status: AC
Start: 2013-09-17 — End: 2013-09-22
  Administered 2013-09-19 – 2013-09-20 (×2): 6 via TOPICAL

## 2013-09-17 MED ORDER — HYDRALAZINE HCL 25 MG PO TABS
37.5000 mg | ORAL_TABLET | Freq: Three times a day (TID) | ORAL | Status: DC
  Administered 2013-09-17 (×3): 37.5 mg via ORAL
  Filled 2013-09-17 (×6): qty 1.5

## 2013-09-17 MED ORDER — OXYCODONE HCL 5 MG PO TABS
10.0000 mg | ORAL_TABLET | ORAL | Status: DC | PRN
  Administered 2013-09-17: 10 mg via ORAL
  Filled 2013-09-17: qty 2

## 2013-09-17 MED ORDER — ISOSORBIDE MONONITRATE ER 30 MG PO TB24
30.0000 mg | ORAL_TABLET | Freq: Every day | ORAL | Status: DC
  Administered 2013-09-17 – 2013-10-02 (×16): 30 mg via ORAL
  Filled 2013-09-17 (×16): qty 1

## 2013-09-17 MED ORDER — MUPIROCIN 2 % EX OINT
1.0000 | TOPICAL_OINTMENT | Freq: Two times a day (BID) | CUTANEOUS | Status: AC
Start: 2013-09-17 — End: 2013-09-22
  Administered 2013-09-17 – 2013-09-21 (×9): 1 via NASAL
  Filled 2013-09-17 (×2): qty 22

## 2013-09-17 MED ORDER — ALLOPURINOL 100 MG PO TABS
100.0000 mg | ORAL_TABLET | Freq: Every day | ORAL | Status: DC
  Administered 2013-09-17 – 2013-10-05 (×19): 100 mg via ORAL
  Filled 2013-09-17 (×19): qty 1

## 2013-09-17 MED ORDER — MORPHINE SULFATE 2 MG/ML IJ SOLN
2.0000 mg | Freq: Four times a day (QID) | INTRAMUSCULAR | Status: DC | PRN
  Administered 2013-09-17 – 2013-10-08 (×61): 2 mg via INTRAVENOUS
  Filled 2013-09-17 (×64): qty 1

## 2013-09-17 MED ORDER — MUPIROCIN 2 % EX OINT: TOPICAL_OINTMENT | Freq: Two times a day (BID) | CUTANEOUS | Status: DC

## 2013-09-17 NOTE — Progress Notes (Signed)
I have seen the patient and reviewed the daily progress note by [place] B Chelsei Mcchesney MS 3 and discussed the care of the patient with them.  See below for documentation of my findings, assessment, and plans.  Subjective: He reports no BM in last 3 days and significantly increased urine output. Left forearm pain has resolved w/decreased edema of left elbow.  Reports diffuse headache that started overnight and feels similar to a migraine.  No N/V, dizziness, or changes in vision. Denies dyspnea.   Objective: Vital signs in last 24 hours: Filed Vitals:   09/17/13 0300 09/17/13 0400 09/17/13 0500 09/17/13 0803  BP: 136/79   138/91  Pulse: 109 114  113  Temp: 98.3 F (36.8 C)   97.2 F (36.2 C)  TempSrc: Oral   Axillary  Resp:      Height:   6' (1.829 m)   Weight:  109.6 kg (241 lb 10 oz)    SpO2: 98% 95%  98%   Weight change: 2.8 kg (6 lb 2.8 oz)  Intake/Output Summary (Last 24 hours) at 09/17/13 1106 Last data filed at 09/17/13 1000  Gross per 24 hour  Intake 1495.55 ml  Output   4725 ml  Net -3229.45 ml   Vitals Reviewed General appearance: cooperative, fatigued, NAD Lungs: clear to auscultation bilaterally; no crackles, rales, or ronchi  Heart: RRR, murmur at LLSB Abdomen: moderately soft, non-tender to palpation; bowel sounds normal; no masses, no organomegaly.  Extremities: 1+ pitting edema in legs bilaterally; swollen ankles; 2+ PT/DP pulses. Left forearm and elbow non-tender to palpation with mild edema in left wrist, but no erythema; ROM somewhat limited.  Neurologic: alert and oriented to general circumstances, follows commands appropriately, moves all extremities voluntairly  Medications: I have reviewed the patient's current medications. Scheduled Meds: . allopurinol  100 mg Oral Daily  . aspirin EC  81 mg Oral Daily  . atorvastatin  80 mg Oral q1800  . famotidine  20 mg Oral Daily  . gabapentin  200 mg Oral BID  . heparin  5,000 Units Subcutaneous 3 times per day  .  hydrALAZINE  37.5 mg Oral TID  . insulin aspart  0-20 Units Subcutaneous TID WC  . insulin aspart  0-5 Units Subcutaneous QHS  . insulin glargine  13 Units Subcutaneous QHS  . isosorbide mononitrate  30 mg Oral Daily  . metolazone  5 mg Oral Daily  . mupirocin ointment   Nasal BID  . predniSONE  40 mg Oral Q breakfast  . sodium chloride  3 mL Intravenous Q12H   Continuous Infusions: . furosemide (LASIX) infusion 15 mg/hr (09/17/13 0800)  . milrinone 0.25 mcg/kg/min (09/17/13 0800)   PRN Meds:.sodium chloride, albuterol, LORazepam, morphine injection, ondansetron (ZOFRAN) IV, oxyCODONE, promethazine, sodium chloride  Assessment/Plan: Mr. Brecker is a 48 year old man with poorly controlled DM2 (HgbA1c 13.5%), CKD Stage 3, chronic systolic non-ischemic CHF with AICD implant (EF 10%, 07/18/12)  2/2 polysubstance abuse (EtOH, cocaine) hospitalized for possible early diverticulitis and cardiorenal syndrome.   Acute on Chronic systolic heart failure: Most recent dry weight in June was 206 lbs, was 231 lbs on presentation, and is 241lbs today with bilateral leg edema.  Started continuous Lasix infusion 15mg /h yesterday and today has increased UOP net ~2.1 L.  Has multiple runs of NSVT.  -Heart Failure team, appreciate help managing this patient: continue milrinone, Lasix gtt, and metolazone; central line to assess CVP & CO-Hb  -Avoid BB due to low output -Avoid ACEi due to  a/c RF -TED hose  Left arm pain: Likely due to gout attack. Pain has resolved.   -Per HF team, continue prednisone  daily x 2 more days  Cardiorenal syndrome: Crt 2.5 today after plateau at 3.1 yesterday from baseline of 2.5. Continues to have dark urine with cold extremities but UOP >1.5/ml/kg/hr with continuous Lasix infusion.  -BMET in am -strict I&O -Lasix gtt and metolazone per above -Nephrology consult if Cr not improving  Possible early diverticulitis: Ab CT with diverticulosis of colon with mild thickening of  sigmoid colon suggestive of early diverticulitis and consistent with LLQ pain. His pain and diarrhea have resolved. C. Diff negative.  -Serial abd exam -Consider Abx if pain worsens or if he decompensates  -Phenergan 12.5mg  prn for nausea/vomiting  -f/u stool pathogen  Constipation: Pt reports no BM in past 3 days, but 7 episodes of diarrhea noted on 8/29.  Will defer enema or Colace for now and monitor BMs today, given recent course of diarrhea.  Pt reports baseline at home is 1 BM every 2 d. -Consider Colace or enema if no BM by tomorrow AM    CAD: Continue home atorvastatin  daily  HTN: Continue hydralazine  q8h  DM2: Continue on sensitive SSI.   GERD: Continue famotidine .   Asthma: Continue albuterol 2.5mg  q2h prn for wheezing.   FEN:  -Diet: carb modified low sodium  DVT prophylaxis: Heparin 5000 units TID subcutaneous   CODE STATUS: FULL CODE. -Discussed with Mr. Bensinger the likelihood of poor outcome with resuscitation and intubation and he acknowledged but he wants to remains Full code despite being under hospice care as outpatient.  Spoke with Child psychotherapist Ms. Althia Forts at [facility] [place], who confirmed that patient has expressed the same wishes to Hospice.      Dispo: Disposition is deferred at this time, awaiting improvement of current medical problems. Anticipated discharge in approximately 1-2 day(s).   The patient does have a current PCP Baltazar Apo, MD) and does need an [facility] hospital follow-up appointment after discharge.   The patient does not know have transportation limitations that hinder transportation to clinic appointments.  .Services Needed at time of discharge: Y = Yes, Blank = No PT:   OT:   RN:   Equipment:   Other:     LOS: 3 days   [place] B Hildred Mollica, Med Student 09/17/2013, 11:06 AM

## 2013-09-17 NOTE — Progress Notes (Signed)
  I have seen and examined the patient myself, and I have reviewed the note by Enis Gash, MS 3 and was present during the interview and physical exam.  Please see my separate H&P for additional findings, assessment, and plan.   Signed: Heywood Iles, MD 09/17/2013, 4:50 PM

## 2013-09-17 NOTE — Progress Notes (Addendum)
Subjective: No acute events overnight. He feels better this AM with improving pain of his L elbow. He has not had a BM for a few days but reports HA without associated nausea, vomiting, dizziness, or changes in vision.   He expresses to me his wishes for having me be his PCP as he feels there is a lot of turnover in the [facility].   Per hospice, his eligibility may need to be reconsidered in light of current treatment. He also wavers in code status given his state of mind, and currently, his 46 year old daughter has expressed to him the need to survive and fight.  Objective: Vital signs in last 24 hours: Filed Vitals:   09/17/13 0400 09/17/13 0500 09/17/13 0803 09/17/13 1158  BP:   138/91 126/70  Pulse: 114  113 102  Temp:   97.2 F (36.2 C) 97.5 F (36.4 C)  TempSrc:   Axillary Oral  Resp:      Height:  6' (1.829 m)    Weight: 241 lb 10 oz (109.6 kg)     SpO2: 95%  98% 96%   Weight change: 6 lb 2.8 oz (2.8 kg)  Intake/Output Summary (Last 24 hours) at 09/17/13 1652 Last data filed at 09/17/13 1615  Gross per 24 hour  Intake 1159.7 ml  Output   5800 ml  Net -4640.3 ml   General appearance: cooperative, fatigued  Lungs: clear to auscultation bilaterally; no crackles, rales, or ronchi  Heart: RRR, 2/6 systolic murmur LLSB Abdomen: soft, tender to palpation with increased pain in LLQ; bowel sounds normal; no masses, no organomegaly.  Extremities: 1+ pitting edema in legs bilaterally; swollen ankles; 2+ PT/DP pulses. Left forearm and elbow non-tender to palpation with mild edema in left wrist, but no erythema; ROM somewhat limited  Neurologic: responds to questions appropriately; moving all extremities freely  BMET    Component Value Date/Time   NA 126* 09/17/2013 0830   K 4.2 09/17/2013 0830   CL 87* 09/17/2013 0830   CO2 25 09/17/2013 0830   GLUCOSE 281* 09/17/2013 0830   BUN 53* 09/17/2013 0830   CREATININE 2.47* 09/17/2013 0830   CREATININE 2.84* 09/14/2013 1203   CALCIUM 8.8  09/17/2013 0830   GFRNONAA 30* 09/17/2013 0830   GFRNONAA 25* 09/14/2013 1203   GFRAA 34* 09/17/2013 0830   GFRAA 29* 09/14/2013 1203    CBC    Component Value Date/Time   WBC 5.0 09/16/2013 2250   RBC 5.19 09/16/2013 2250   HGB 14.8 09/16/2013 2250   HCT 43.2 09/16/2013 2250   PLT 194 09/16/2013 2250   MCV 83.2 09/16/2013 2250   MCH 28.5 09/16/2013 2250   MCHC 34.3 09/16/2013 2250   RDW 16.9* 09/16/2013 2250   LYMPHSABS 0.9 09/14/2013 1203   MONOABS 0.4 09/14/2013 1203   EOSABS 0.0 09/14/2013 1203   BASOSABS 0.0 09/14/2013 1203    Medications: I have reviewed the patient's current medications. Scheduled Meds: . allopurinol  100 mg Oral Daily  . aspirin EC  81 mg Oral Daily  . atorvastatin  80 mg Oral q1800  . Chlorhexidine Gluconate Cloth  6 each Topical Q0600  . famotidine  20 mg Oral Daily  . gabapentin  200 mg Oral BID  . heparin  5,000 Units Subcutaneous 3 times per day  . hydrALAZINE  37.5 mg Oral TID  . insulin aspart  0-20 Units Subcutaneous TID WC  . insulin aspart  0-5 Units Subcutaneous QHS  . insulin glargine  13 Units  Subcutaneous QHS  . isosorbide mononitrate  30 mg Oral Daily  . metolazone  5 mg Oral Daily  . mupirocin ointment  1 application Nasal BID  . predniSONE  40 mg Oral Q breakfast  . sodium chloride  3 mL Intravenous Q12H   Continuous Infusions: . furosemide (LASIX) infusion 15 mg/hr (09/17/13 0800)  . milrinone 0.25 mcg/kg/min (09/17/13 1615)   PRN Meds:.sodium chloride, albuterol, LORazepam, ondansetron (ZOFRAN) IV, oxyCODONE, sodium chloride Assessment/Plan: Mr. Leathem is a 48 year old man with poorly controlled DM2 (HgbA1c 13.5%), CKD Stage 3, chronic systolic non-ischemic CHF with AICD implant (EF 10%, 07/18/12)  2/2 polysubstance abuse (EtOH, cocaine) hospitalized for possible early diverticulitis and cardiorenal syndrome found to have a possible acute gout flare with improvement today.  #Cardiorenal syndrome: CHF complicated by his CKD. 241 lbs today, up  from 206 lbs in June, though he continues to diurese ~2L today (net -4L). Crt 2.47 today, stable from yesterday, which is consistent with improvement in his condition. -Per Heart Failure team, continue milrinone, Lasix gtt,  metolazone, central line for to assess CVP & CO-Hb  -Avoid BB due to low output -Avoid ACEi due to a/c RF -Continue TED hose -Continue strict I&O -Consult nephrology if Crt is worsening  #Acute gout flare: His pain is improved today.  -Continue prednisone 40mg  (Day 2/3)  #Possible early diverticulitis: Ab CT with diverticulosis of colon with mild thickening of sigmoid colon suggestive of early diverticulitis and consistent with initial LLQ pain though pain and diarrhea have mostly resovled.  -GI panel pending -Give Zofran 4mg  IV prn for nausea/vomiting -Discontinued morphine 2mg  IV in favor of his home pain medication  #CAD: Continue home atorvastatin 80mg  daily  #HTN: Continue hydralazine 25mg  q8h  #DM2: Continue on sensitive SSI.   #GERD: Continue famotidine 20mg .   #Asthma: Continue albuterol 2.5mg  q2h prn for wheezing.   #FEN:  -Diet: carb modified low sodium  #DVT prophylaxis: Heparin 5000 units TID subcutaneous   #CODE STATUS: FULL CODE. -Discussed with him the likelihood of poor outcome with resuscitation and intubation and he acknowledged but he wants to remains Full code despite being under hospice care as outpatient.   Dispo: Disposition is deferred at this time, awaiting improvement of current medical problems. Anticipated discharge in approximately 1-2 day(s).   The patient does have a current PCP Baltazar Apo, MD) and does need an [facility] hospital follow-up appointment after discharge.   The patient does not know have transportation limitations that hinder transportation to clinic appointments.  .Services Needed at time of discharge: Y = Yes, Blank = No PT:   OT:   RN:   Equipment:   Other:     LOS: 3 days   Heywood Iles,  MD 09/17/2013, 4:52 PM

## 2013-09-17 NOTE — Care Management Note (Addendum)
    Page 1 of 2   10/08/2013     11:17:32 AM CARE MANAGEMENT NOTE 10/08/2013  Patient:  [name], [name]   Account Number:  [id number]  Date Initiated:  09/17/2013  Documentation initiated by:  Junius Creamer  Subjective/Objective Assessment:   adm w ch pain     Action/Plan:   pcp dr s Marilu Favre, lives w fam  act w [facility]   Anticipated DC Date:  10/08/2013   Anticipated DC Plan:  HOME W [facility] CARE  In-house referral  Clinical Social Worker  Hospice / Palliative Care      DC Planning Services  CM consult      [facility] Choice  Resumption Of Svcs/PTA Provider   Choice offered to / List presented to:     DME arranged  [facility] - MANUAL      DME agency  [facility]     [facility] arranged  HH-1 RN  HH-6 SOCIAL WORKER      Status of service:  Completed, signed off Medicare Important Message given?  YES (If response is "NO", the following Medicare IM given date fields will be blank) Date Medicare IM given:  10/08/2013 Medicare IM given by:  Junius Creamer Date Additional Medicare IM given:  09/27/2013 Additional Medicare IM given by:  Junius Creamer  Discharge Disposition:  HOME W HOSPICE CARE  Per UR Regulation:  Reviewed for med. necessity/level of care/duration of stay  If discussed at Long Length of Stay Meetings, dates discussed:   09/20/2013  09/25/2013  09/27/2013  10/02/2013  10/04/2013  10/09/2013    Comments:  9/21  1109 debbie [place]ond rn,bsn spoke w karen w [facility]. pt for dc home today. hospice has ordered pt w/c. he plans on going home by taxi that his friend owns. he does want to talk w soc worker poonum if his transp falls through. have spoken w poonum to speak w pt. pt uses [facility] and they del and pt has no way to get meds. they close at 5pm so pt will need to get home before 5pm.  [id number] Davis,RN,BSN,CCM: patient does not want to go to [facility] nor [facility] wants to go home/spoke with Clydie [name] with hospice/they will try to  piece meal together what he needs if he is allowed to go home.  [id number]/IDPOEU Davis,RN,BSN,CCM: iv milrinone dcd on [id number] Lasix infusion continues/diuresing well/poss dcd to home with hospice in the am.  Dayna Barker with [facility] notified.  [id number] Davis,RN,BSN,CCM: patient with complex history of heart failure due to polysubstance and etoh abuse/end stage heart failure and under hospice care/iv lasix and strict i&o for diuresis/pt c/o of continual pain/ abusive to staff and MD/outcome will be home with hospice or resdiental hospice.  9/1  1001 debbie Akshar Starnes rn,bsn [facility] aware pt in hospital.

## 2013-09-17 NOTE — Progress Notes (Signed)
Inpatient RN visit- [name] [facility] 2H  Room 23-[facility] RN Visit-[name] RN  Related admission to [facility] diagnosis of Hypertrophic Cardiomyopathy.  Pt is Full Code. [facility] unaware of pt admission until today (8/31). Per chart review pt admitted from [facility] for abdominal pain, n/v and chest pain. Pt alert & oriented, sitting up on the side of the bed eating lunch. Pt had company visiting, interactive and appropriate with all present. He reports he is "feeling better than when I came in".   Per chart review pt started on IV lasix (8/30) and IV Milrinone (8/29) for management of fluid overload/wt gain.  Current wt appears to show a 10 lb wt gain in the last 3 days. Pt reports he has had "big" increase in his urine output, and his appetite has returned. He had also been worked up for c-diff, d/t having multiple bm's over the weekend.Stool culture was negative per staff RN [place].  [facility] SW to f/u with patient today. [facility] will continue to follow through discharge.  Patient's home medication list is on shadow chart.  Please call [facility] @ [phone] with any hospice needs.   Thank you. Hansel Starling, RN  [facility]  Hospice Liaison  [phone])

## 2013-09-17 NOTE — Progress Notes (Signed)
I went to see Mr. Oen a couple of times this admission to to say hello and also touch base.  We had a long discussion today in regards to opc follow up issues.  He is currently happy with his care and would like to continue with [facility] follow up, however, he wishes to only be seen by certain providers first if available in [facility].  I will remain as his PCP and other providers he requests are Dr. Allena Katz and Dr. Garald Braver.  Dr. Rogelia Boga also joined the conversation near the end and we will do our best to accommodate regular follow ups in [facility] with his preferred providers and AM appointments when available.  He will likely need long office visit slots given multiple complex medical problems that need to be addressed at regular visits to prevent multiple office visits that he cannot make due to transportation, child care accommodations, and his overall health.  We also sincerely appreciate hospital assistance and social work assistance with his continued care at home.  He is requesting more frequent nursing visits if possible and also personal care services that I think social work is trying to assist him with at this time. I have let him know that I will be away for most of September, thus, he will be seeing hopefully one of the above providers listed for hospital follow up if possible.

## 2013-09-17 NOTE — Progress Notes (Signed)
Inpatient Diabetes Program Recommendations  AACE/ADA: New Consensus Statement on Inpatient Glycemic Control (2013)  Target Ranges:  Prepandial:   less than 140 mg/dL      Peak postprandial:   less than 180 mg/dL (1-2 hours)      Critically ill patients:  140 - 180 mg/dL  Results for KENDARIOUS, DUBUQUE (MRN [id number]) as of 09/17/2013 11:09  Ref. Range 09/16/2013 12:24 09/16/2013 16:55 09/16/2013 21:35 09/17/2013 08:06  Glucose-Capillary Latest Range: 70-99 mg/dL 024 (H) 097 (H) 353 (H) 261 (H)   Inpatient Diabetes Program Recommendations Insulin - Basal: increase Lantus to home dose 20 units  Thank you  Piedad Climes BSN, RN,CDE Inpatient Diabetes Coordinator [phone] (team pager)

## 2013-09-17 NOTE — Progress Notes (Signed)
Advanced Heart Failure Rounding Note   Subjective:    [name] is a 48y/o male with HTN, DM2, polysunbstance abuse with longstanding HF due to NICM (probably ETOH), EF 10%. Current cocaine abuse. Was previously on [facility].   Admitted with ab pain & n/v/diarrhea. Weight up to 231. No response to oral diuretics. + edema, fatigue. Multiple runs NSVT. Cr 2.11->2.84-3.10   Noncontrast CT ab: Diverticulosis of the colon with mild thickening of the sigmoid colon suggesting early diverticulitis. No abscess. Hepatic cirrhosis with mild ascites and edema throughout the subcutaneous soft tissues.  Started on milrinone on 09/15/13 for co-ox 51%. Changed to lasix gtt 15 mg/hr yesterday with a dose of metoloazone and 24 hr I/O -2.1 liters. Weight not accurate. Belly feeling better. SBP 140s.   Objective:   Weight Range:  Vital Signs:   Temp:  [97.7 F (36.5 C)-98.6 F (37 C)] 98.3 F (36.8 C) (08/31 0300) Pulse Rate:  [62-119] 114 (08/31 0400) Resp:  [18] 18 (08/30 0800) BP: (109-186)/(70-147) 136/79 mmHg (08/31 0300) SpO2:  [93 %-100 %] 95 % (08/31 0400) Weight:  [241 lb 10 oz (109.6 kg)] 241 lb 10 oz (109.6 kg) (08/31 0400) Last BM Date: 09/14/13  Weight change: Filed Weights   09/15/13 0500 09/16/13 0402 09/17/13 0400  Weight: 231 lb (104.781 kg) 235 lb 7.2 oz (106.8 kg) 241 lb 10 oz (109.6 kg)    Intake/Output:   Intake/Output Summary (Last 24 hours) at 09/17/13 0735 Last data filed at 09/17/13 0600  Gross per 24 hour  Intake 1838.55 ml  Output   4000 ml  Net -2161.45 ml     Physical Exam: CVP 12 General: Chronically ill appearing. No resp difficulty;  HEENT: normal  Neck: supple. RIJ central line. JVP 11. Carotids 2+ bilaterally; no bruits. No lymphadenopathy or thryomegaly appreciated.  Cor: PMI normal. Regular rate & rhythm. 2/6 systolic murmur LLSB. + S3  Lungs: clear  Abdomen: obese, soft, nontender, + distended. No hepatosplenomegaly. No bruits or masses. Good  bowel sounds.  Extremities: no cyanosis, clubbing, rash. 2+ bilateral edema. L elbow sore to touch swollen  Neuro: alert & orientedx3, cranial nerves grossly intact. Moves all 4 extremities w/o difficulty. Affect pleasant.when   Telemetry: ST 110-115  Labs: Basic Metabolic Panel:  Recent Labs Lab 09/14/13 1203 09/14/13 1447 09/14/13 1923 09/15/13 0105 09/16/13 0425 09/16/13 2120  NA 127*  --   --  129* 131* 127*  K 6.5* 6.5* 5.6* 5.1 4.0 4.5  CL 90*  --   --  91* 91* 89*  CO2 20  --   --  GLUCOSE 212*  --  179* 199* 161* 242*  BUN 50*  --   --  53* 53* 51*  CREATININE 2.84*  --   --  3.10* 3.11* 2.56*  CALCIUM 9.5  --   --  9.8 8.6 8.7  MG  --   --   --   --  2.1  --     Liver Function Tests:  Recent Labs Lab 09/14/13 1203 09/15/13 0105  AST 21 25  ALT 10 9  ALKPHOS 91 83  BILITOT 3.1* 2.8*  PROT 7.8 7.7  ALBUMIN 3.6 3.4*    Recent Labs Lab 09/14/13 1203 09/14/13 1923  LIPASE 70* 61*   No results found for this basename: AMMONIA,  in the last 168 hours  CBC:  Recent Labs Lab 09/14/13 1203 09/15/13 0105 09/16/13 0425 09/16/13 2250  WBC 4.3 4.1 5.2  5.0  NEUTROABS 2.9  --   --   --   HGB 16.9 15.7 15.2 14.8  HCT 50.7 45.7 44.4 43.2  MCV 85.1 83.2 83.5 83.2  PLT 194 183 183 194    Cardiac Enzymes:  Recent Labs Lab 09/14/13 1203 09/14/13 1923 09/15/13 0105 09/15/13 0732  TROPONINI <0.30 <0.30 <0.30 <0.30    BNP: BNP (last 3 results)  Recent Labs  07/12/13 1344 09/08/13 2224 09/14/13 1203  PROBNP 5773.0* 6632.0* 10023*     Other results:  EKG:   Imaging: Dg Chest Port 1 View  09/15/2013   CLINICAL DATA:  Status post central line placement  EXAM: PORTABLE CHEST - 1 VIEW  COMPARISON:  09/14/2013  FINDINGS: A defibrillator is noted on the left. A right jugular central line is noted in the mid to distal superior vena cava. No pneumothorax is seen. Mild interstitial changes are again identified. No focal infiltrate is seen.   IMPRESSION: No pneumothorax following central line placement.   Electronically Signed   By: Alcide Clever M.D.   On: 09/15/2013 12:38      Medications:     Scheduled Medications: . aspirin EC  81 mg Oral Daily  . atorvastatin  80 mg Oral q1800  . digoxin  0.125 mg Oral Daily  . famotidine  20 mg Oral Daily  . gabapentin  200 mg Oral BID  . heparin  5,000 Units Subcutaneous 3 times per day  . hydrALAZINE  25 mg Oral 3 times per day  . insulin aspart  0-20 Units Subcutaneous TID WC  . insulin aspart  0-5 Units Subcutaneous QHS  . insulin glargine  13 Units Subcutaneous QHS  . metolazone  5 mg Oral Daily  . predniSONE  40 mg Oral Q breakfast  . sodium chloride  3 mL Intravenous Q12H     Infusions: . furosemide (LASIX) infusion 15 mg/hr (09/17/13 0219)  . milrinone 0.25 mcg/kg/min (09/17/13 0218)     PRN Medications:  sodium chloride, albuterol, LORazepam, morphine injection, ondansetron (ZOFRAN) IV, oxyCODONE, promethazine, sodium chloride   Assessment:   1. A/c systolic HF with low output physiology  --d/c weight in 6/15 was 206 pounds  2. NICM EF 10-15%  3. A/c renal failure, stage IV (baseline Cr ~2.0-2.5)  4. Hyperkalemia  5. NSVT  6. N/V/diarrhea - w/u for C. Diff in progress  7. H/o polysubstance abuse  8. Noncompliance  9. Hyponatremia  10. Acute gout in L elbow  Plan/Discussion:    Presented in low output and was started on IV milrinone and transitioned to lasix gtt yesterday. Weight not accurate but having good UOP. CVP 12 this am will continue lasix gtt one more day with plan to transition to PO torsemide tomorrow. Renal function pending.   Still having some gout pain will continue burst of prednisone and will restart allopurinol.   SBP 130-140s will increase hydralazine 37.5 mg TID and add Imdur 30 mg daily. Not on ACE-I or [name] with CKD and no BB with low output.  Consult CR for ambulation. Place TED hose.    Length of Stay: 3 Aundria Rud  NP-C 09/17/2013, 7:35 AM  Advanced Heart Failure Team Pager [phone] (M-F; 7a - 4p)  Please contact [facility] for night-coverage after hours (4p -7a ) and weekends on [email or web address]  Patient seen and examined with Ulla Potash, NP. We discussed all aspects of the encounter. I agree with the assessment and plan as stated above.   Much improved with  inotropes and lasix gtt + metolazone. Would continue. Still with fluid on board. Renal function better. Watch electrolytes.   Brinleigh Tew,MD 8:46 AM

## 2013-09-17 NOTE — Progress Notes (Signed)
CARDIAC REHAB PHASE I   PRE:  Rate/Rhythm: 103 St with PVC    BP: sitting 120/83    SaO2: 97 RA  MODE:  Ambulation: 310 ft   POST:  Rate/Rhythm: 113 ST    BP: sitting 139/83     SaO2: 97 RA  Pt reluctant to walk, visitor in room encouraged him to do so. Small, slow steps. C/o thighs burning/painful with walking. Also c/o HA. At end of walk c/o dizziness. VSS. Will f/u. [phone]   Elissa Lovett [place] CES, ACSM 09/17/2013 2:47 PM

## 2013-09-17 NOTE — Progress Notes (Signed)
  Date: 09/17/2013  Patient name: [name]  Medical record number: [id number]  Date of birth: 09-20-1965   This patient has been seen and the plan of care was discussed with the house staff. Please see their note for complete details. I concur with their findings with the following additions/corrections: He is in good spirits today and is feeling better and less "tense" from fluid. Elbow also feeling better and he is able to bend it. Net negative 4 L but weight is up 4 lbs (? Accuracy).   1. Acute on chronic heart failure,  Non ischemic 2. Presumed gout L elbow 3. Acute on chronic renal failure - improving 4. Outpt hospice pt, full code  Burns Spain, MD 09/17/2013, 2:58 PM

## 2013-09-18 DIAGNOSIS — M109 Gout, unspecified: Secondary | ICD-10-CM

## 2013-09-18 DIAGNOSIS — I1310 Hypertensive heart and chronic kidney disease without heart failure, with stage 1 through stage 4 chronic kidney disease, or unspecified chronic kidney disease: Secondary | ICD-10-CM

## 2013-09-18 DIAGNOSIS — E871 Hypo-osmolality and hyponatremia: Secondary | ICD-10-CM

## 2013-09-18 LAB — GLUCOSE, CAPILLARY
GLUCOSE-CAPILLARY: 265 mg/dL — AB (ref 70–99)
GLUCOSE-CAPILLARY: 414 mg/dL — AB (ref 70–99)
Glucose-Capillary: 278 mg/dL — ABNORMAL HIGH (ref 70–99)
Glucose-Capillary: 291 mg/dL — ABNORMAL HIGH (ref 70–99)
Glucose-Capillary: 378 mg/dL — ABNORMAL HIGH (ref 70–99)

## 2013-09-18 LAB — BASIC METABOLIC PANEL
Anion gap: 16 — ABNORMAL HIGH (ref 5–15)
BUN: 56 mg/dL — ABNORMAL HIGH (ref 6–23)
CHLORIDE: 84 meq/L — AB (ref 96–112)
CO2: 25 meq/L (ref 19–32)
Calcium: 9.5 mg/dL (ref 8.4–10.5)
Creatinine, Ser: 2.09 mg/dL — ABNORMAL HIGH (ref 0.50–1.35)
GFR calc Af Amer: 42 mL/min — ABNORMAL LOW (ref >90)
GFR calc non Af Amer: 36 mL/min — ABNORMAL LOW (ref >90)
Glucose, Bld: 293 mg/dL — ABNORMAL HIGH (ref 70–99)
POTASSIUM: 4 meq/L (ref 3.7–5.3)
Sodium: 125 mEq/L — ABNORMAL LOW (ref 137–147)

## 2013-09-18 LAB — CARBOXYHEMOGLOBIN
Carboxyhemoglobin: 1.5 % (ref 0.5–1.5)
Methemoglobin: 0.6 % (ref 0.0–1.5)
O2 Saturation: 70.4 %
Total hemoglobin: 15.6 g/dL (ref 13.5–18.0)

## 2013-09-18 MED ORDER — INSULIN ASPART 100 UNIT/ML ~~LOC~~ SOLN
20.0000 [IU] | Freq: Once | SUBCUTANEOUS | Status: AC
  Administered 2013-09-18: 20 [IU] via SUBCUTANEOUS

## 2013-09-18 MED ORDER — OXYCODONE HCL 5 MG PO TABS
10.0000 mg | ORAL_TABLET | ORAL | Status: DC | PRN
  Administered 2013-09-18: 10 mg via ORAL
  Filled 2013-09-18 (×3): qty 2

## 2013-09-18 MED ORDER — MORPHINE SULFATE 2 MG/ML IJ SOLN
2.0000 mg | Freq: Once | INTRAMUSCULAR | Status: AC
  Administered 2013-09-18: 2 mg via INTRAVENOUS
  Filled 2013-09-18: qty 1

## 2013-09-18 MED ORDER — LOPERAMIDE HCL 2 MG PO CAPS: 2.0000 mg | ORAL_CAPSULE | Freq: Every day | ORAL | Status: DC | PRN

## 2013-09-18 MED ORDER — HYDRALAZINE HCL 50 MG PO TABS
50.0000 mg | ORAL_TABLET | Freq: Three times a day (TID) | ORAL | Status: DC
  Administered 2013-09-18 – 2013-10-01 (×42): 50 mg via ORAL
  Filled 2013-09-18 (×45): qty 1

## 2013-09-18 MED ORDER — LOPERAMIDE HCL 2 MG PO CAPS
2.0000 mg | ORAL_CAPSULE | Freq: Every day | ORAL | Status: DC | PRN
  Administered 2013-09-18: 2 mg via ORAL
  Filled 2013-09-18: qty 1

## 2013-09-18 MED ORDER — LOPERAMIDE HCL 2 MG PO CAPS
4.0000 mg | ORAL_CAPSULE | Freq: Once | ORAL | Status: AC
  Administered 2013-09-18: 4 mg via ORAL
  Filled 2013-09-18: qty 2

## 2013-09-18 MED ORDER — TOLVAPTAN 15 MG PO TABS
30.0000 mg | ORAL_TABLET | Freq: Once | ORAL | Status: AC
  Administered 2013-09-18: 30 mg via ORAL
  Filled 2013-09-18: qty 2

## 2013-09-18 MED ORDER — OXYCODONE HCL 5 MG PO TABS
5.0000 mg | ORAL_TABLET | ORAL | Status: DC | PRN
  Administered 2013-09-19 – 2013-10-01 (×31): 5 mg via ORAL
  Filled 2013-09-18 (×39): qty 1

## 2013-09-18 NOTE — Progress Notes (Addendum)
Inpatient RN visit- Samule Ugalde [facility] 2H Room 23-[facility] RN Visit-[name] RN  Related admission to [facility] diagnosis of  Hypertrophic Cardiomyopathy. Pt is Full Code Pt seen at bedside lying in bed asleep, roused briefly to Printmaker. Per staff RN [place], pt was up overnight with multiple bm's,( c-diff negative), he also had just recvd pain medication for headache pain. Pt remains with generalized edema, elevated CVP and continues on IV lasix and IV milrinone.  [facility] will continue to follow through discharge.  Patient's home medication list and transfer summary in place on shadow chart.  Please call [facility] @ [phone] with any hospice needs.   Thank you. Hansel Starling, RN  [facility]  Hospice Liaison  [phone])

## 2013-09-18 NOTE — Progress Notes (Signed)
Subjective: [name] reports 4 episodes of watery non-bloody diarrhea overnight with nausea, but no vomiting.  His headache has improved from yesterday, but is still 6/10 in severity.  He reports poor sleep overnight.  He requests a different medication for his pain, since morphine is not working.  He denies fever, chills, SOB, and chest pain.    Objective: Vital signs in last 24 hours: Filed Vitals:   09/18/13 0405 09/18/13 0805 09/18/13 1211 09/18/13 1714  BP:  111/79 137/80 126/87  Pulse:  112 111 110  Temp: 97.8 F (36.6 C) 98 F (36.7 C) 97.3 F (36.3 C) 97.3 F (36.3 C)  TempSrc: Oral Oral Oral Oral  Resp:      Height:      Weight: 232 lb 8 oz (105.461 kg)     SpO2:  96% 100% 97%   Weight change: -9 lb 2 oz (-4.139 kg)  Intake/Output Summary (Last 24 hours) at 09/18/13 1856 Last data filed at 09/18/13 1800  Gross per 24 hour  Intake  789.6 ml  Output   2250 ml  Net -1460.4 ml   General appearance: lying in bed, cooperative, very fatigued, NAD Lungs: clear to auscultation bilaterally; no crackles, rales, or ronchi Heart: RRR; S1, S2 normal; +S3; systolic murmur LLSB Abdomen: soft, tender to palpation; bowel sounds normal; no masses, no organomegaly. Extremities: +2 pitting edema to thighs bilaterally; swollen, enlarged ankles; cold extremities; 2+ PT/DP pulses; left elbow tender to palpation w/some swelling Neurologic: alert and oriented to general circumstances  Lab Results: Basic Metabolic Panel:  Recent Labs Lab 09/16/13 0425  09/17/13 0830 09/18/13 0400  NA 131*  < > 126* 125*  K 4.0  < > 4.2 4.0  CL 91*  < > 87* 84*  CO2 21  < > 25 25  GLUCOSE 161*  < > 281* 293*  BUN 53*  < > 53* 56*  CREATININE 3.11*  < > 2.47* 2.09*  CALCIUM 8.6  < > 8.8 9.5  MG 2.1  --   --   --   < > = values in this interval not displayed.  Liver Function Tests:  Recent Labs Lab 09/14/13 1203 09/15/13 0105  AST 21 25  ALT 10 9  ALKPHOS 91 83  BILITOT 3.1* 2.8*  PROT  7.8 7.7  ALBUMIN 3.6 3.4*    Recent Labs Lab 09/14/13 1203 09/14/13 1923  LIPASE 70* 61*   CBC:  Recent Labs Lab 09/14/13 1203  09/16/13 0425 09/16/13 2250  WBC 4.3  < > 5.2 5.0  NEUTROABS 2.9  --   --   --   HGB 16.9  < > 15.2 14.8  HCT 50.7  < > 44.4 43.2  MCV 85.1  < > 83.5 83.2  PLT 194  < > 183 194  < > = values in this interval not displayed.   CBG:  Recent Labs Lab 09/17/13 1200 09/17/13 1703 09/17/13 2129 09/18/13 0807 09/18/13 1220 09/18/13 1718  GLUCAP 268* 300* 290* 278* 291* 265*    Micro Results: Recent Results (from the past 240 hour(s))  MRSA PCR SCREENING     Status: Abnormal   Collection Time    09/15/13 11:01 AM      Result Value Ref Range Status   MRSA by PCR POSITIVE (*) NEGATIVE Final   Comment:            The GeneXpert MRSA Assay (FDA     approved for NASAL specimens  only), is one component of a     comprehensive MRSA colonization     surveillance program. It is not     intended to diagnose MRSA     infection nor to guide or     monitor treatment for     MRSA infections.     RESULT CALLED TO, READ BACK BY AND VERIFIED WITH:     BRODY J.,RN 09/15/13 1455 BY JONESJ  CLOSTRIDIUM DIFFICILE BY PCR     Status: None   Collection Time    09/15/13 11:15 AM      Result Value Ref Range Status   C difficile by pcr NEGATIVE  NEGATIVE Final   Medications: I have reviewed the patient's current medications. Scheduled Meds: . allopurinol  100 mg Oral Daily  . aspirin EC  81 mg Oral Daily  . atorvastatin  80 mg Oral q1800  . Chlorhexidine Gluconate Cloth  6 each Topical Q0600  . famotidine  20 mg Oral Daily  . gabapentin  200 mg Oral BID  . heparin  5,000 Units Subcutaneous 3 times per day  . hydrALAZINE  50 mg Oral TID  . insulin aspart  0-20 Units Subcutaneous TID WC  . insulin aspart  0-5 Units Subcutaneous QHS  . insulin glargine  13 Units Subcutaneous QHS  . isosorbide mononitrate  30 mg Oral Daily  . mupirocin ointment  1  application Nasal BID  . sodium chloride  3 mL Intravenous Q12H   Continuous Infusions: . furosemide (LASIX) infusion 15 mg/hr (09/18/13 1600)  . 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   HTN (hypertension)   Acute renal failure superimposed on stage 3 chronic kidney disease   HLD (hyperlipidemia)   History of cocaine abuse   CAD (coronary artery disease)   Acute on chronic systolic heart failure   Hyperkalemia   Nausea with vomiting   Diarrhea   Heart failure   Hepatic cirrhosis  [name] is a 48 year old male with poorly controlled DM2 (A1c 13.5), CKD Stage 3, chronic systolic non-ischemic CHF (EF 99%, 07/18/12) secondary to alcoholism, and cocaine abuse who is admitted from clinic for abdominal pain, nausea, vomiting, and diarrhea found to have hyperkalemia and acute kidney injury.   Abdominal pain, nausea, vomiting, diarrhea:  Diverticulitis is most likely etiology given LLQ pain and abdominal CT that showed mild thickening of sigmoid colon. Hepatitis panel unremarkable. Patient remains afebrile, but given watery diarrhea, infectious etiology also possible.  C diff and GI pathogen panel was negative.  -Advance diet as tolerated  -Check orthostatic vital signs -Continue Phenergan 12.5mg  prn for nausea/vomiting   Cardiorenal syndrome: Crt 2.09 today, down from 2.47 yesterday w/baseline 2.5 and 3.10 on admission.  Wt decreased 4kg. Net I/O over last 24 hrs >3.4L. Continues to have cold extremities indicative of poor perfusion, though remains hypervolemic on exam as evidenced by 2+ peripheral edema and JVP.  -HF team following. Appreciate their assistance. -Increase hydralazine to 50mg  TID -Hold  Metolazone today  -Continue milrinone 0.25 mcg/kg/min -Continue Lasix gtt -Continue Imdur 30mg  daily -Keep central line: assess CVP & CO-Hb  - Avoid beta blocker due to low output  -Avoid ACE- inhibitor due to acute renal failure  -Continue strict I&Os  -Daily weights  Hyponatremia: Pt's Na  127 on admission and slowly decreasing. -Per HF team, will start Tolvaptan 30 mg x 1 dose -Repeat BMP tomorrow AM  Hyperkalemia:  Resolved. K 4.0 this AM, down from 6.5 on admission. Will continue to monitor and give kayexelate if needed.   -Repeat BMP tomorrow AM -Monitor on telemetry   Arm pain: Likely due to gout attack.  Pain improving w/prednisone burst. -Per HF team, continue prednisone  daily x 1 more dose.  CAD: Continue atorvastatin  daily and ASA 81 mg daily.  HTN: BP 111/79 this AM. -Per HF team, increase Hydralazine to  TID -Continue Lasix gtt   DM2:  -Continue on sensitive SSI.  -Continue Lantus 13 units SQ QHS  GERD:  -Continue famotidine .   Asthma:  -Continue albuterol 2.5mg  q2h prn for wheezing.   FEN:  -Diet: Modified carb diet  DVT prophylaxis: Heparin 5000 units subcutaneous   CODE STATUS: FULL CODE.  Began conversations about end-of-life care on 8/28 and pt confirmed full code status 8/28.  Spoke with Hospice social worker yesterday, who confirmed that during pt's most recent conversation, he expressed that he wanted to stay on full code.      Dispo: Disposition is deferred at this time, awaiting improvement of current medical problems. Anticipated discharge in approximately 1-2 day(s).  The patient does have a current PCP Baltazar Apo, MD) and does need an [facility] hospital follow-up appointment after discharge.  The patient does not know have transportation limitations that hinder transportation to clinic appointments.   This is a Psychologist, occupational Note.  The care of the patient was discussed with Dr. Heywood Iles and the assessment and plan  formulated with their assistance.  Please see their attached note for official documentation of the daily encounter.   LOS: 4 days   Rich Number, MD 09/18/2013, 6:56 PM   I have seen and examined the patient, and reviewed the daily progress note by [place] Hanvey, MS 3 and discussed the care of the patient with them. Please see my progress note from 09/18/2013 for further details regarding assessment and plan.    Signed:  Rich Number, MD 09/18/2013, 6:56 PM

## 2013-09-18 NOTE — Progress Notes (Signed)
Pt seen and examined with Dr. Beckie Salts and Dr. Sherrine Maples. Please refer to the resident note for details. I have read their note and agree with their findings and assessment and plan with the following addition; Pt complained of persistent HA as well as complains of body aches especially in the legs. States his elbow is better but still unable to straighten it  - Pt with cardiorenal syndrome. Cr now improving. C/w lasix gtt and milrinone gtt per Cardio - Hydralazine increased to 50 mg TID. Metolazone on hold - Pt with hyponatremia- Started on Tolvaptan per cardio - c/w prednisone for gout flare - Pt with recurrent diarrhea- uncertain etiology. C diff/GI pathogen panel negative. Will monitor

## 2013-09-18 NOTE — Progress Notes (Signed)
Came earlier and pt sleeping soundly. Now pt is irate about food and RN warned me not to try to walk him.  Will f/u as time allows. Ethelda Chick CES, ACSM 2:33 PM 09/18/2013

## 2013-09-18 NOTE — Addendum Note (Signed)
Addended by: Remus Blake on: 09/18/2013 08:54 AM   Modules accepted: Orders

## 2013-09-18 NOTE — Progress Notes (Signed)
Subjective: Patient reports he had 4 episodes of non-bloody diarrhea this AM. He reports mild diffuse abdominal pain and nausea with no vomiting. He also notes a headache that he describes as 6/10 in severity, constant, and mostly in the frontal area. He notes palpitations occasionally, but denies fever, chills, SOB, chest pain. He is also complaining of pain in his back and states the morphine is not helping and would like something else for his pain.  Objective: Vital signs in last 24 hours: Filed Vitals:   09/18/13 0400 09/18/13 0405 09/18/13 0805 09/18/13 1211  BP: 136/83  111/79 137/80  Pulse: 124  112 111  Temp:  97.8 F (36.6 C) 98 F (36.7 C) 97.3 F (36.3 C)  TempSrc:  Oral Oral Oral  Resp: 18     Height:      Weight:  232 lb 8 oz (105.461 kg)    SpO2: 100%  96% 100%   Weight change: -9 lb 2 oz (-4.139 kg)  Intake/Output Summary (Last 24 hours) at 09/18/13 1442 Last data filed at 09/18/13 1300  Gross per 24 hour  Intake  756.7 ml  Output   2275 ml  Net -1518.3 ml   Physical Exam: General: sitting up in bed, eating breakfast, NAD HEENT: [place]/AT, EOMI, PERRL, mucus membranes moist CV: RRR, 2/6 systolic murmur Pulm: CTA bilaterally, breaths non-labored Abd: BS+, soft, mild tenderness to LLQ, non-distended Msk: 1+ pitting edema in lower extremities bilaterally, distal pulses 2+. Mild edema in left elbow, nontender to palpation Neuro: alert and oriented x 3, CNs II-XII intact  Lab Results: Basic Metabolic Panel:  Recent Labs Lab 09/16/13 0425  09/17/13 0830 09/18/13 0400  NA 131*  < > 126* 125*  K 4.0  < > 4.2 4.0  CL 91*  < > 87* 84*  CO2 21  < > 25 25  GLUCOSE 161*  < > 281* 293*  BUN 53*  < > 53* 56*  CREATININE 3.11*  < > 2.47* 2.09*  CALCIUM 8.6  < > 8.8 9.5  MG 2.1  --   --   --   < > = values in this interval not displayed. Liver Function Tests:  Recent Labs Lab 09/14/13 1203 09/15/13 0105  AST 21 25  ALT 10 9  ALKPHOS 91 83  BILITOT 3.1*  2.8*  PROT 7.8 7.7  ALBUMIN 3.6 3.4*    Recent Labs Lab 09/14/13 1203 09/14/13 1923  LIPASE 70* 61*   No results found for this basename: AMMONIA,  in the last 168 hours CBC:  Recent Labs Lab 09/14/13 1203  09/16/13 0425 09/16/13 2250  WBC 4.3  < > 5.2 5.0  NEUTROABS 2.9  --   --   --   HGB 16.9  < > 15.2 14.8  HCT 50.7  < > 44.4 43.2  MCV 85.1  < > 83.5 83.2  PLT 194  < > 183 194  < > = values in this interval not displayed. Cardiac Enzymes:  Recent Labs Lab 09/14/13 1923 09/15/13 0105 09/15/13 0732  TROPONINI <0.30 <0.30 <0.30   BNP:  Recent Labs Lab 09/14/13 1203  PROBNP 10023*   D-Dimer: No results found for this basename: DDIMER,  in the last 168 hours CBG:  Recent Labs Lab 09/17/13 0806 09/17/13 1200 09/17/13 1703 09/17/13 2129 09/18/13 0807 09/18/13 1220  GLUCAP 261* 268* 300* 290* 278* 291*   Hemoglobin A1C: No results found for this basename: HGBA1C,  in the last 168 hours Fasting  Lipid Panel: No results found for this basename: CHOL, HDL, LDLCALC, TRIG, CHOLHDL, LDLDIRECT,  in the last 168 hours Thyroid Function Tests: No results found for this basename: TSH, T4TOTAL, FREET4, T3FREE, THYROIDAB,  in the last 168 hours Coagulation: No results found for this basename: LABPROT, INR,  in the last 168 hours Anemia Panel: No results found for this basename: VITAMINB12, FOLATE, FERRITIN, TIBC, IRON, RETICCTPCT,  in the last 168 hours Urine Drug Screen: Drugs of Abuse     Component Value Date/Time   LABOPIA NONE DETECTED 09/14/2013 2359   LABOPIA NEG 02/01/2012 1356   COCAINSCRNUR NONE DETECTED 09/14/2013 2359   COCAINSCRNUR PPS 02/01/2012 1356   LABBENZ NONE DETECTED 09/14/2013 2359   LABBENZ PPS 02/01/2012 1356   LABBENZ NEG 07/09/2010 1522   AMPHETMU NONE DETECTED 09/14/2013 2359   AMPHETMU NEG 07/09/2010 1522   THCU NONE DETECTED 09/14/2013 2359   LABBARB NONE DETECTED 09/14/2013 2359   LABBARB NEG 02/01/2012 1356    Alcohol Level: No  results found for this basename: ETH,  in the last 168 hours Urinalysis:  Recent Labs Lab 09/14/13 2359  COLORURINE ORANGE*  LABSPEC 1.018  PHURINE 5.0  GLUCOSEU NEGATIVE  HGBUR TRACE*  BILIRUBINUR MODERATE*  KETONESUR NEGATIVE  PROTEINUR >300*  UROBILINOGEN 1.0  NITRITE NEGATIVE  LEUKOCYTESUR NEGATIVE    Micro Results: Recent Results (from the past 240 hour(s))  MRSA PCR SCREENING     Status: Abnormal   Collection Time    09/15/13 11:01 AM      Result Value Ref Range Status   MRSA by PCR POSITIVE (*) NEGATIVE Final   Comment:            The GeneXpert MRSA Assay (FDA     approved for NASAL specimens     only), is one component of a     comprehensive MRSA colonization     surveillance program. It is not     intended to diagnose MRSA     infection nor to guide or     monitor treatment for     MRSA infections.     RESULT CALLED TO, READ BACK BY AND VERIFIED WITH:     BRODY J.,RN 09/15/13 1455 BY JONESJ  CLOSTRIDIUM DIFFICILE BY PCR     Status: None   Collection Time    09/15/13 11:15 AM      Result Value Ref Range Status   C difficile by pcr NEGATIVE  NEGATIVE Final   Studies/Results: No results found. Medications: I have reviewed the patient's current medications. Scheduled Meds: . allopurinol  100 mg Oral Daily  . aspirin EC  81 mg Oral Daily  . atorvastatin  80 mg Oral q1800  . Chlorhexidine Gluconate Cloth  6 each Topical Q0600  . famotidine  20 mg Oral Daily  . gabapentin  200 mg Oral BID  . heparin  5,000 Units Subcutaneous 3 times per day  . hydrALAZINE  50 mg Oral TID  . insulin aspart  0-20 Units Subcutaneous TID WC  . insulin aspart  0-5 Units Subcutaneous QHS  . insulin glargine  13 Units Subcutaneous QHS  . isosorbide mononitrate  30 mg Oral Daily  . mupirocin ointment  1 application Nasal BID  . sodium chloride  3 mL Intravenous Q12H   Continuous Infusions: . furosemide (LASIX) infusion 15 mg/hr (09/18/13 0800)  . milrinone 0.25 mcg/kg/min  (09/18/13 0700)   PRN Meds:.sodium chloride, albuterol, [START ON 09/19/2013] loperamide, LORazepam, morphine injection, ondansetron (ZOFRAN) IV, oxyCODONE,  sodium chloride Assessment/Plan: [name] is a 48yo man w/ PMHx of poorly controlled Type 2 DM, CKD Stage 3, chronic systolic non-ischemic cardiomyopathy with AICD (EF 10%) 2/2 polysubstance abuse (alcohol, cocaine) who was admitted for abdominal pain, nausea, vomiting, and diarrhea and cardiorenal syndrome.   1. Cardiorenal syndrome: CHF complicated by CKD. Weight is 232 lb today, down 9 lbs from yesterday. He has diuresed 3.4 L in last 24 hours. Cr 2.09 today, down from 2.47 yesterday. Cr appears to be trending down overall.  - Heart Failure team following, appreciate recs - Increase Hydralazine 50 mg TID - Hold Metolazone for now - Continue Milirone - Continue Lasix gtt - Continue Imdur 30 mg daily - Keep central line to access CVP and CO-Hb - Avoid beta blocker due to low output - Avoid ACE- inhibitor due to acute renal failure - Continue strict I&Os - Daily weights  2. Hyponatremia:  Patient's sodium has been steadily decreasing: 127-->126-->125 today. Patient asymptomatic.  - Tolvaptan 30 mg started by HF team.  3. Acute gout flare: Pain improved today, elbow appears slightly swollen - Continue Prednisone 40 mg (Day 3/3) - D/c tomorrow  4. Possible early diverticulitis?: Pt having recurrent diarrhea this AM with mild abdominal pain. CT Abd showed diverticulosis of colon with mild thickening of sigmoid colon suggestive of early diverticulitis. GI panel neg. C diff neg.  - Continue Zofran 4 mg IV prn - Pt on morphine  Q6H prn severe pain  - Oxycodone 5 mg Q4H prn pain started today  5. CAD: Continue home atorvastatin 80 mg daily and ASA 81 mg daily.   6. HTN: BP 136/83 today.  - Hydralazine increased to 50 mg TID by HF team - Continue Lasix  7. Type 2 DM: Blood sugars in 260-300s.  - Continue Lantus 13 units QHS -  Continue insulin sliding scale  8. GERD: - Continue famotidine .   9. Asthma: - Continue albuterol 2.5mg  q2h prn for wheezing.  Diet: Carb modified DVT/PE PPx: heparin SQ Dispo: Deferred at this time. Awaiting resolution of medical problems.    The patient does have a current PCP Baltazar Apo, MD) and does need an [facility] hospital follow-up appointment after discharge.  The patient does not have transportation limitations that hinder transportation to clinic appointments.  .Services Needed at time of discharge: Y = Yes, Blank = No PT:   OT:   RN:   Equipment:   Other:     LOS: 4 days   Rich Number, MD 09/18/2013, 2:42 PM

## 2013-09-18 NOTE — Progress Notes (Signed)
Advanced Heart Failure Rounding Note   Subjective:    [name] is a 48y/o male with HTN, DM2, polysunbstance abuse with longstanding HF due to NICM (probably ETOH), EF 10%. Current cocaine abuse. Was previously on [facility].   Admitted with ab pain & n/v/diarrhea. Weight up to 231. No response to oral diuretics. + edema, fatigue. Multiple runs NSVT. Cr 2.11->2.84-3.10   Noncontrast CT ab: Diverticulosis of the colon with mild thickening of the sigmoid colon suggesting early diverticulitis. No abscess. Hepatic cirrhosis with mild ascites and edema throughout the subcutaneous soft tissues.  Started on milrinone on 09/15/13 for co-ox 51%. Remains on lasix gtt 15 mg/hr. Weight down 9 lbs and 24 hr I/O -3.4 liters. Having diarrhea again. Increased hydralazine to 37.5 mg TID and Imdur 30 mg/hr. Renal function trending down. Sodium trending down.   Objective:   Weight Range:  Vital Signs:   Temp:  [97.3 F (36.3 C)-98.1 F (36.7 C)] 97.8 F (36.6 C) (09/01 0405) Pulse Rate:  [98-124] 124 (09/01 0400) Resp:  [18-24] 18 (09/01 0400) BP: (126-136)/(69-85) 136/83 mmHg (09/01 0400) SpO2:  [96 %-100 %] 100 % (09/01 0400) Weight:  [232 lb 8 oz (105.461 kg)] 232 lb 8 oz (105.461 kg) (09/01 0405) Last BM Date: 09/14/13  Weight change: Filed Weights   09/16/13 0402 09/17/13 0400 09/18/13 0405  Weight: 235 lb 7.2 oz (106.8 kg) 241 lb 10 oz (109.6 kg) 232 lb 8 oz (105.461 kg)    Intake/Output:   Intake/Output Summary (Last 24 hours) at 09/18/13 0805 Last data filed at 09/18/13 0600  Gross per 24 hour  Intake  723.8 ml  Output   3475 ml  Net -2751.2 ml     Physical Exam: CVP 19-20 General: Chronically ill appearing. No resp difficulty;  HEENT: normal  Neck: supple. RIJ central line. JVP to ear. Carotids 2+ bilaterally; no bruits. No lymphadenopathy or thryomegaly appreciated.  Cor: PMI normal. Tachy & regular rhythm. 2/6 systolic murmur LLSB. + S3  Lungs: clear  Abdomen: obese,  soft, nontender, + distended. No hepatosplenomegaly. No bruits or masses. Good bowel sounds.  Extremities: no cyanosis, clubbing, rash. 2-3+ bilateral edema. L elbow sore to touch swollen  Neuro: alert & orientedx3, cranial nerves grossly intact. Moves all 4 extremities w/o difficulty. Affect pleasant.when   Telemetry: ST 110-115  Labs: Basic Metabolic Panel:  Recent Labs Lab 09/15/13 0105 09/16/13 0425 09/16/13 2120 09/17/13 0830 09/18/13 0400  NA 129* 131* 127* 126* 125*  K 5.1 4.0 4.5 4.2 4.0  CL 91* 91* 89* 87* 84*  CO2 19 21 22 25 25   GLUCOSE 199* 161* 242* 281* 293*  BUN 53* 53* 51* 53* 56*  CREATININE 3.10* 3.11* 2.56* 2.47* 2.09*  CALCIUM 9.8 8.6 8.7 8.8 9.5  MG  --  2.1  --   --   --     Liver Function Tests:  Recent Labs Lab 09/14/13 1203 09/15/13 0105  AST 21 25  ALT 10 9  ALKPHOS 91 83  BILITOT 3.1* 2.8*  PROT 7.8 7.7  ALBUMIN 3.6 3.4*    Recent Labs Lab 09/14/13 1203 09/14/13 1923  LIPASE 70* 61*   No results found for this basename: AMMONIA,  in the last 168 hours  CBC:  Recent Labs Lab 09/14/13 1203 09/15/13 0105 09/16/13 0425 09/16/13 2250  WBC 4.3 4.1 5.2 5.0  NEUTROABS 2.9  --   --   --   HGB 16.9 15.7 15.2 14.8  HCT 50.7 45.7 44.4 43.2  MCV 85.1 83.2 83.5 83.2  PLT 194 183 183 194    Cardiac Enzymes:  Recent Labs Lab 09/14/13 1203 09/14/13 1923 09/15/13 0105 09/15/13 0732  TROPONINI <0.30 <0.30 <0.30 <0.30    BNP: BNP (last 3 results)  Recent Labs  07/12/13 1344 09/08/13 2224 09/14/13 1203  PROBNP 5773.0* 6632.0* 10023*     Other results:  EKG:   Imaging: No results found.   Medications:     Scheduled Medications: . allopurinol  100 mg Oral Daily  . aspirin EC  81 mg Oral Daily  . atorvastatin  80 mg Oral q1800  . Chlorhexidine Gluconate Cloth  6 each Topical Q0600  . famotidine  20 mg Oral Daily  . gabapentin  200 mg Oral BID  . heparin  5,000 Units Subcutaneous 3 times per day  .  hydrALAZINE  37.5 mg Oral TID  . insulin aspart  0-20 Units Subcutaneous TID WC  . insulin aspart  0-5 Units Subcutaneous QHS  . insulin glargine  13 Units Subcutaneous QHS  . isosorbide mononitrate  30 mg Oral Daily  . metolazone  5 mg Oral Daily  . mupirocin ointment  1 application Nasal BID  . predniSONE  40 mg Oral Q breakfast  . sodium chloride  3 mL Intravenous Q12H    Infusions: . furosemide (LASIX) infusion 15 mg/hr (09/17/13 2334)  . milrinone 0.25 mcg/kg/min (09/18/13 0444)    PRN Medications: sodium chloride, albuterol, LORazepam, morphine injection, ondansetron (ZOFRAN) IV, sodium chloride   Assessment:   1. A/c systolic HF with low output physiology  --d/c weight in 6/15 was 206 pounds  2. NICM EF 10-15%  3. A/c renal failure, stage IV (baseline Cr ~2.0-2.5)  4. Hyperkalemia  5. NSVT  6. N/V/diarrhea - w/u for C. Diff in progress  7. H/o polysubstance abuse  8. Noncompliance  9. Hyponatremia  10. Acute gout in L elbow 11. NSVT  Plan/Discussion:    Presented in low output and was started on IV milrinone and transitioned to lasix gtt. Volume status improving and diuresing briskly, however remains volume overloaded with CVP 20 and hyponatremic. Will add dose of 30 mg of tolvaptan. Hold metolazone for now. Will need to follow Na+ closely.   Renal function stable, will continue to follow.   Gout pain better will continue burst of prednisone and continue allopurinol.   SBP remains 130s will increase hydralazine to 50 mg TID and continue Imdur.   Consult CR for ambulation. Place TED hose.    Length of Stay: 4 Jonathan Rud NP-C 09/18/2013, 8:05 AM  Advanced Heart Failure Team Pager [phone] (M-F; 7a - 4p)  Please contact [facility] for night-coverage after hours (4p -7a ) and weekends on [email or web address]  Patient seen and examined with Jonathan Potash, NP. We discussed all aspects of the encounter. I agree with the assessment and plan as stated above.    Diuresis and renal function improved on milrinone. Sodium dropping. Will give one dose tolvaptan. Continue lasix and milrinone.   Jonathan Gunnells,MD 11:46 AM

## 2013-09-18 NOTE — Progress Notes (Signed)
Pt requesting graham crackers and a [place] sandwich. pts CBG 414. Educated pt on eating his diabetic snack. Pt is getting increasingly agitated. Unable to calm. Despite education pt continue to demand a [place] sandwich and grahman crackers.  Perkins, [place] Elizabeth

## 2013-09-19 LAB — GLUCOSE, CAPILLARY
GLUCOSE-CAPILLARY: 258 mg/dL — AB (ref 70–99)
GLUCOSE-CAPILLARY: 258 mg/dL — AB (ref 70–99)
Glucose-Capillary: 224 mg/dL — ABNORMAL HIGH (ref 70–99)
Glucose-Capillary: 322 mg/dL — ABNORMAL HIGH (ref 70–99)

## 2013-09-19 LAB — BASIC METABOLIC PANEL
Anion gap: 15 (ref 5–15)
BUN: 59 mg/dL — ABNORMAL HIGH (ref 6–23)
CHLORIDE: 86 meq/L — AB (ref 96–112)
CO2: 29 meq/L (ref 19–32)
Calcium: 9.2 mg/dL (ref 8.4–10.5)
Creatinine, Ser: 2.1 mg/dL — ABNORMAL HIGH (ref 0.50–1.35)
GFR calc Af Amer: 42 mL/min — ABNORMAL LOW (ref >90)
GFR calc non Af Amer: 36 mL/min — ABNORMAL LOW (ref >90)
GLUCOSE: 219 mg/dL — AB (ref 70–99)
POTASSIUM: 3.7 meq/L (ref 3.7–5.3)
Sodium: 130 mEq/L — ABNORMAL LOW (ref 137–147)

## 2013-09-19 LAB — CARBOXYHEMOGLOBIN
Carboxyhemoglobin: 1.5 % (ref 0.5–1.5)
Methemoglobin: 0.5 % (ref 0.0–1.5)
O2 Saturation: 85 %
TOTAL HEMOGLOBIN: 15.2 g/dL (ref 13.5–18.0)

## 2013-09-19 LAB — MAGNESIUM: Magnesium: 1.9 mg/dL (ref 1.5–2.5)

## 2013-09-19 MED ORDER — INSULIN GLARGINE 100 UNIT/ML ~~LOC~~ SOLN
20.0000 [IU] | Freq: Every day | SUBCUTANEOUS | Status: DC
  Administered 2013-09-19 – 2013-10-07 (×18): 20 [IU] via SUBCUTANEOUS
  Filled 2013-09-19 (×21): qty 0.2

## 2013-09-19 MED ORDER — POTASSIUM CHLORIDE CRYS ER 20 MEQ PO TBCR
30.0000 meq | EXTENDED_RELEASE_TABLET | Freq: Once | ORAL | Status: AC
  Administered 2013-09-19: 30 meq via ORAL
  Filled 2013-09-19 (×2): qty 1

## 2013-09-19 MED ORDER — MILRINONE IN DEXTROSE 20 MG/100ML IV SOLN
0.1250 ug/kg/min | INTRAVENOUS | Status: DC
  Administered 2013-09-19 – 2013-09-23 (×7): 0.25 ug/kg/min via INTRAVENOUS
  Administered 2013-09-24 – 2013-09-28 (×5): 0.125 ug/kg/min via INTRAVENOUS
  Filled 2013-09-19 (×15): qty 100

## 2013-09-19 MED ORDER — INSULIN ASPART 100 UNIT/ML ~~LOC~~ SOLN: 6.0000 [IU] | Freq: Three times a day (TID) | SUBCUTANEOUS | Status: DC

## 2013-09-19 MED ORDER — METOLAZONE 5 MG PO TABS
5.0000 mg | ORAL_TABLET | Freq: Every day | ORAL | Status: DC
  Administered 2013-09-19: 5 mg via ORAL
  Filled 2013-09-19 (×2): qty 1

## 2013-09-19 NOTE — Progress Notes (Signed)
Pt seen and examined with Dr. Beckie Salts and Dr. Sherrine Maples. Please refer to the resident note for details. I have read their note and agree with their findings and assessment and plan with the following addition;  - Pt complained of persistent diarrhea- 3 episodes last night. No episodes today. Expresses frustration at multiple doctors examining him.  - Pt with cardiorenal syndrome. Cr now improving. C/w lasix gtt and milrinone gtt per Cardio. Metolazone resumed today - c/w hydralazine, nitrate. Pt remains non compliant with diet.   - Pt with hyponatremia- Improved today. S/p 1 dose of tolvaptan yesterday with improvement of Na to 130   - s/p prednisone for gout flare. C/w allopurinol. Elbow improving   - Pt with recurrent diarrhea- uncertain etiology. C diff/GI pathogen panel negative. Started on immodium today  - Pt with elevated BS. Will increase lantus and novolog today and monitor

## 2013-09-19 NOTE — Progress Notes (Signed)
Subjective: Mr. Fadden reports watery non-bloody diarrhea overnight with nausea, but notes that diarrhea has improved after receiving imodium.  He also reports nausea, but no vomiting.  His headache is stable from yesterday (6/10 in severity).  He reports poor sleep overnight.  He denies fever, chills, SOB, and chest pain.    Objective: Vital signs in last 24 hours: Filed Vitals:   09/19/13 1000 09/19/13 1100 09/19/13 1200 09/19/13 1207  BP:    114/66  Pulse: 115 112 107 110  Temp:    97.7 F (36.5 C)  TempSrc:    Oral  Resp:      Height:      Weight:      SpO2: 97% 96% 97% 94%   Weight change: -1.814 kg (-4 lb)  Intake/Output Summary (Last 24 hours) at 09/19/13 1405 Last data filed at 09/19/13 1300  Gross per 24 hour  Intake 1476.7 ml  Output   2475 ml  Net -998.3 ml   General appearance: sitting up in bed, cooperative, very fatigued, NAD Lungs: clear to auscultation bilaterally; no crackles, rales, or ronchi Heart: RRR; S1, S2 normal; +S3; systolic murmur LLSB Abdomen: soft, bowel sounds normal Extremities: +2 pitting edema to thighs bilaterally; swollen, enlarged ankles; cold extremities; 2+ PT/DP pulses; left elbow tender to palpation w/some swelling Neurologic: alert and oriented to general circumstances  Lab Results: Basic Metabolic Panel:  Recent Labs Lab 09/16/13 0425  09/18/13 0400 09/19/13 0419  NA 131*  < > 125* 130*  K 4.0  < > 4.0 3.7  CL 91*  < > 84* 86*  CO2 21  < > 25 29  GLUCOSE 161*  < > 293* 219*  BUN 53*  < > 56* 59*  CREATININE 3.11*  < > 2.09* 2.10*  CALCIUM 8.6  < > 9.5 9.2  MG 2.1  --   --   --   < > = values in this interval not displayed.  Liver Function Tests:  Recent Labs Lab 09/14/13 1203 09/15/13 0105  AST 21 25  ALT 10 9  ALKPHOS 91 83  BILITOT 3.1* 2.8*  PROT 7.8 7.7  ALBUMIN 3.6 3.4*    Recent Labs Lab 09/14/13 1203 09/14/13 1923  LIPASE 70* 61*   CBC:  Recent Labs Lab 09/14/13 1203  09/16/13 0425  09/16/13 2250  WBC 4.3  < > 5.2 5.0  NEUTROABS 2.9  --   --   --   HGB 16.9  < > 15.2 14.8  HCT 50.7  < > 44.4 43.2  MCV 85.1  < > 83.5 83.2  PLT 194  < > 183 194  < > = values in this interval not displayed.   CBG:  Recent Labs Lab 09/18/13 1220 09/18/13 1718 09/18/13 2114 09/18/13 2347 09/19/13 0716 09/19/13 1203  GLUCAP 291* 265* 414* 378* 258* 224*   Medications: I have reviewed the patient's current medications. Scheduled Meds: . allopurinol  100 mg Oral Daily  . aspirin EC  81 mg Oral Daily  . atorvastatin  80 mg Oral q1800  . Chlorhexidine Gluconate Cloth  6 each Topical Q0600  . famotidine  20 mg Oral Daily  . gabapentin  200 mg Oral BID  . heparin  5,000 Units Subcutaneous 3 times per day  . hydrALAZINE  50 mg Oral TID  . insulin aspart  0-20 Units Subcutaneous TID WC  . insulin aspart  0-5 Units Subcutaneous QHS  . insulin glargine  20 Units Subcutaneous QHS  .  isosorbide mononitrate  30 mg Oral Daily  . metolazone  5 mg Oral Daily  . mupirocin ointment  1 application Nasal BID  . sodium chloride  3 mL Intravenous Q12H   Continuous Infusions: . furosemide (LASIX) infusion 15 mg/hr (09/19/13 0900)  . milrinone 0.25 mcg/kg/min (09/19/13 0622)   PRN Meds:.sodium chloride, albuterol, loperamide, LORazepam, morphine injection, ondansetron (ZOFRAN) IV, oxyCODONE, sodium chloride  Assessment/Plan: Principal Problem:   Chest pain Active Problems:   Type II diabetes mellitus with renal manifestations not at goal   HTN (hypertension)   Acute renal failure superimposed on stage 3 chronic kidney disease   HLD (hyperlipidemia)   History of cocaine abuse   CAD (coronary artery disease)   Acute on chronic systolic heart failure   Hyperkalemia   Nausea with vomiting   Diarrhea   Heart failure   Hepatic cirrhosis  Mr. Mares is a 48 year old male with poorly controlled DM2 (A1c 13.5), CKD Stage 3, chronic systolic non-ischemic CHF (EF 20%, 07/18/12) secondary to  alcoholism, and cocaine abuse who is admitted from clinic for abdominal pain, nausea, vomiting, and diarrhea found to have hyperkalemia and acute kidney injury.   Abdominal pain, nausea, vomiting, diarrhea:  Diverticulitis is most likely etiology given LLQ pain and abdominal CT that showed mild thickening of sigmoid colon. Hepatitis panel unremarkable. Patient remains afebrile, but given watery diarrhea, infectious etiology also possible.  C diff and GI pathogen panel was negative.  -Continue Imodium 2mg  daily PRN -Continue Phenergan 12.5mg  prn for nausea/vomiting   Cardiorenal syndrome: Crt 2.10 today, stable from yesterday (baseline Crt 2.5 and 3.10 on admission).  Wt decreased 1.9kg in last 24 hrs. Net I/O over last 24 hrs >1.2L. Continues to have cold extremities indicative of poor perfusion, though remains hypervolemic on exam as evidenced by 2+ peripheral edema and JVP.  -HF team following. Appreciate their assistance. -Continue Hydralazine 50mg  TID -Re-started Metolazone 5mg  oral daily  -Continue Milrinone 0.25 mcg/kg/min -Continue Lasix gtt -Continue Imdur 30mg  daily -Keep central line: assess CVP & CO-Hb  -Avoid beta blocker due to low output  -Avoid ACE- inhibitor due to acute renal failure  -Continue strict I&Os  -Daily weights  Hyponatremia: Na 130 today after receiving 1 dose Tolvaptan 30 mg yesterday.  -Repeat BMP tomorrow AM  Hyperkalemia:  K is 3.6 today, trending down from 6.5 on admission.  Given multiple PVCs, will supplement K. -Give KCl oral x 1 dose -Repeat BMP tomorrow AM -Monitor on telemetry   Arm pain: Likely due to gout attack.  Pain improving.  -Per HF team, continue Allopurinol 100mg  daily   CAD: Continue atorvastatin 80mg  daily and ASA 81 mg daily.  HTN: BP 111/79 this AM. -Per HF team, continue Hydralazine to 50mg  TID -Continue Lasix gtt   DM2:  -Continue on sensitive SSI.  -Continue Lantus 13 units SQ QHS  GERD:  -Continue famotidine 20mg   daily.   Asthma:  -Continue albuterol 2.5mg  q2h prn for wheezing.   FEN:  -Diet: Heart diet  DVT prophylaxis: Heparin 5000 units subcutaneous   CODE STATUS: FULL CODE.  Began conversations about end-of-life care on 8/28 and pt confirmed full code status 8/28.  Hospice social worker following.   Dispo: Disposition is deferred at this time, awaiting improvement of current medical problems. Anticipated discharge in approximately 1-2 day(s).   The patient does have a current PCP Baltazar Apo, MD) and does need an [facility] hospital follow-up appointment after discharge.  The patient does not know have  transportation limitations that hinder transportation to clinic appointments.   This is a Psychologist, occupational Note.  The care of the patient was discussed with Dr. Heywood Iles and the assessment and plan formulated with their assistance.  Please see their attached note for official documentation of the daily encounter.   LOS: 5 days   [place] B Hanvey, Med Student 09/19/2013, 2:05 PM   I have seen and examined the patient, and reviewed the daily progress note by [place] Hanvey, MS 3 and discussed the care of the patient with them. Please see my progress note from 09/19/2013 for further details regarding assessment and plan.    Signed:  Rich Number, MD 09/19/2013, 8:25 PM

## 2013-09-19 NOTE — Progress Notes (Signed)
Inpatient RN visit- [name] [facility] 2H Room 23-[name] RN Visit-[name] RN  Related admission to [facility] diagnosis of Hypertrophic Cardiomyopathy.  Pt is Full Code. Pt seen at bedside, lying in bed, sound asleep, did not awaken to voice or gentle touch.   Staff RN Vernard Gambles in after Clinical research associate and woke pt for insulin administration, pt was able to sit up on the side of the bed to eat his lunch. Per chart review and discussion with Emilee, pt remains on the IV lasix and IV milrinone. His wt is down to 228, CVP remains elevated at 15. He recvd 1 dose of imodium for loose stools, and recvd 1 dose of IV morphine early this am for pain.Marland Kitchen [facility] will continue to follow through discharge.  Patient's home medication list and transfer summary in place on shadow chart.  Please call [facility] @ [phone]-  with any hospice needs.   Thank you. Jonathan Starling, RN  [facility]  Hospice Liaison  [phone])

## 2013-09-19 NOTE — Progress Notes (Signed)
Inpatient Diabetes Program Recommendations  AACE/ADA: New Consensus Statement on Inpatient Glycemic Control (2013)  Target Ranges:  Prepandial:   less than 140 mg/dL      Peak postprandial:   less than 180 mg/dL (1-2 hours)      Critically ill patients:  140 - 180 mg/dL   Results for SAYER, DERBYSHIRE (MRN [id number]) as of 09/19/2013 11:57  Ref. Range 09/18/2013 08:07 09/18/2013 12:20 09/18/2013 17:18 09/18/2013 21:14 09/18/2013 23:47 09/19/2013 07:16  Glucose-Capillary Latest Range: 70-99 mg/dL 121 (H) 975 (H) 883 (H) 414 (H) 378 (H) 258 (H)   Diabetes history: DM2 Outpatient Diabetes medications: Lantus 20 units QHS Current orders for Inpatient glycemic control: Lantus 13 units QHS, Novolog 0-20 units AC, Novolog 0-5 units HS   Inpatient Diabetes Program Recommendations Insulin - Basal: Please increase Lantus to 20 units QHS (home dose). Insulin - Meal Coverage: Please consider ordering Novolog 6 units TID with meals for meal coverage.  Thanks, Orlando Penner, RN, MSN, CCRN Diabetes Coordinator Inpatient Diabetes Program [phone] (Team Pager) [phone] ([facility] office) [phone] [facility] office)

## 2013-09-19 NOTE — Progress Notes (Signed)
Subjective: Patient reports he had 3 episodes of non-bloody diarrhea last night. He also notes occasional palpitations. He denies fever, chills, chest pain, SOB, abdominal pain, nausea, and vomiting.   Objective: Vital signs in last 24 hours: Filed Vitals:   09/19/13 0719 09/19/13 0800 09/19/13 0900 09/19/13 1000  BP: 139/47     Pulse: 105 117 115 115  Temp: 97.9 F (36.6 C)     TempSrc: Oral     Resp:      Height:      Weight:      SpO2: 95% 99% 97% 97%   Weight change: -4 lb (-1.814 kg)  Intake/Output Summary (Last 24 hours) at 09/19/13 1113 Last data filed at 09/19/13 1000  Gross per 24 hour  Intake 1476.7 ml  Output   2475 ml  Net -998.3 ml   Physical Exam: General: agitated, laying in bed, NAD HEENT: [place]/AT, EOMI, mucus membranes moist Neck: supple, JVD to mandible, no carotid bruits, right IJ central line CV: tachycardic, 2/6 systolic murmur Pulm: CTA bilaterally, breaths non-labored Abd: BS+, soft, nontender, nondistended Msk: warm, 2+ pitting edema in lower extremities bilaterally Neuro: alert and oriented x 3, CNs II-XII intact  Lab Results: Basic Metabolic Panel:  Recent Labs Lab 09/16/13 0425  09/18/13 0400 09/19/13 0419  NA 131*  < > 125* 130*  K 4.0  < > 4.0 3.7  CL 91*  < > 84* 86*  CO2 21  < > 25 29  GLUCOSE 161*  < > 293* 219*  BUN 53*  < > 56* 59*  CREATININE 3.11*  < > 2.09* 2.10*  CALCIUM 8.6  < > 9.5 9.2  MG 2.1  --   --   --   < > = values in this interval not displayed. Liver Function Tests:  Recent Labs Lab 09/14/13 1203 09/15/13 0105  AST 21 25  ALT 10 9  ALKPHOS 91 83  BILITOT 3.1* 2.8*  PROT 7.8 7.7  ALBUMIN 3.6 3.4*    Recent Labs Lab 09/14/13 1203 09/14/13 1923  LIPASE 70* 61*   No results found for this basename: AMMONIA,  in the last 168 hours CBC:  Recent Labs Lab 09/14/13 1203  09/16/13 0425 09/16/13 2250  WBC 4.3  < > 5.2 5.0  NEUTROABS 2.9  --   --   --   HGB 16.9  < > 15.2 14.8  HCT 50.7  < >  44.4 43.2  MCV 85.1  < > 83.5 83.2  PLT 194  < > 183 194  < > = values in this interval not displayed. Cardiac Enzymes:  Recent Labs Lab 09/14/13 1923 09/15/13 0105 09/15/13 0732  TROPONINI <0.30 <0.30 <0.30   BNP:  Recent Labs Lab 09/14/13 1203  PROBNP 10023*   D-Dimer: No results found for this basename: DDIMER,  in the last 168 hours CBG:  Recent Labs Lab 09/18/13 0807 09/18/13 1220 09/18/13 1718 09/18/13 2114 09/18/13 2347 09/19/13 0716  GLUCAP 278* 291* 265* 414* 378* 258*   Hemoglobin A1C: No results found for this basename: HGBA1C,  in the last 168 hours Fasting Lipid Panel: No results found for this basename: CHOL, HDL, LDLCALC, TRIG, CHOLHDL, LDLDIRECT,  in the last 168 hours Thyroid Function Tests: No results found for this basename: TSH, T4TOTAL, FREET4, T3FREE, THYROIDAB,  in the last 168 hours Coagulation: No results found for this basename: LABPROT, INR,  in the last 168 hours Anemia Panel: No results found for this basename: VITAMINB12, FOLATE,  FERRITIN, TIBC, IRON, RETICCTPCT,  in the last 168 hours Urine Drug Screen: Drugs of Abuse     Component Value Date/Time   LABOPIA NONE DETECTED 09/14/2013 2359   LABOPIA NEG 02/01/2012 1356   COCAINSCRNUR NONE DETECTED 09/14/2013 2359   COCAINSCRNUR PPS 02/01/2012 1356   LABBENZ NONE DETECTED 09/14/2013 2359   LABBENZ PPS 02/01/2012 1356   LABBENZ NEG 07/09/2010 1522   AMPHETMU NONE DETECTED 09/14/2013 2359   AMPHETMU NEG 07/09/2010 1522   THCU NONE DETECTED 09/14/2013 2359   LABBARB NONE DETECTED 09/14/2013 2359   LABBARB NEG 02/01/2012 1356    Alcohol Level: No results found for this basename: ETH,  in the last 168 hours Urinalysis:  Recent Labs Lab 09/14/13 2359  COLORURINE ORANGE*  LABSPEC 1.018  PHURINE 5.0  GLUCOSEU NEGATIVE  HGBUR TRACE*  BILIRUBINUR MODERATE*  KETONESUR NEGATIVE  PROTEINUR >300*  UROBILINOGEN 1.0  NITRITE NEGATIVE  LEUKOCYTESUR NEGATIVE     Micro Results: Recent  Results (from the past 240 hour(s))  MRSA PCR SCREENING     Status: Abnormal   Collection Time    09/15/13 11:01 AM      Result Value Ref Range Status   MRSA by PCR POSITIVE (*) NEGATIVE Final   Comment:            The GeneXpert MRSA Assay (FDA     approved for NASAL specimens     only), is one component of a     comprehensive MRSA colonization     surveillance program. It is not     intended to diagnose MRSA     infection nor to guide or     monitor treatment for     MRSA infections.     RESULT CALLED TO, READ BACK BY AND VERIFIED WITH:     BRODY J.,RN 09/15/13 1455 BY JONESJ  CLOSTRIDIUM DIFFICILE BY PCR     Status: None   Collection Time    09/15/13 11:15 AM      Result Value Ref Range Status   C difficile by pcr NEGATIVE  NEGATIVE Final   Studies/Results: No results found. Medications: I have reviewed the patient's current medications. Scheduled Meds: . allopurinol  100 mg Oral Daily  . aspirin EC  81 mg Oral Daily  . atorvastatin  80 mg Oral q1800  . Chlorhexidine Gluconate Cloth  6 each Topical Q0600  . famotidine  20 mg Oral Daily  . gabapentin  200 mg Oral BID  . heparin  5,000 Units Subcutaneous 3 times per day  . hydrALAZINE  50 mg Oral TID  . insulin aspart  0-20 Units Subcutaneous TID WC  . insulin aspart  0-5 Units Subcutaneous QHS  . insulin glargine  13 Units Subcutaneous QHS  . isosorbide mononitrate  30 mg Oral Daily  . metolazone  5 mg Oral Daily  . mupirocin ointment  1 application Nasal BID  . sodium chloride  3 mL Intravenous Q12H   Continuous Infusions: . furosemide (LASIX) infusion 15 mg/hr (09/19/13 0900)  . milrinone 0.25 mcg/kg/min (09/19/13 0622)   PRN Meds:.sodium chloride, albuterol, loperamide, LORazepam, morphine injection, ondansetron (ZOFRAN) IV, oxyCODONE, sodium chloride Assessment/Plan: Mr. Hallowell is a 48yo man w/ PMHx of poorly controlled Type 2 DM, CKD Stage 3, chronic systolic non-ischemic cardiomyopathy with AICD (EF 10%) 2/2  polysubstance abuse (alcohol, cocaine) who was admitted for abdominal pain, nausea, vomiting, and diarrhea and cardiorenal syndrome.  1. Cardiorenal syndrome: CHF complicated by CKD. Weight is 228 lb today,  down 4 lbs from yesterday. He has diuresed 1.2 L in last 24 hours. Cr 2.10 today, similar to yesterday at 2.09. Cr appears to be trending down overall.  - Heart Failure team following, appreciate recs  - Continue Hydralazine 50 mg TID  - Start Metolazone 5 mg daily today - Continue Milirone  - Continue Lasix gtt  - Continue Imdur 30 mg daily  - Keep central line to access CVP and CO-Hb  - Avoid beta blocker due to low output  - Avoid ACE- inhibitor due to acute renal failure  - Continue strict I&Os  - Daily weights   2. Hyponatremia- Resolving: Patient asymptomatic. Patient's sodium had been steadily decreasing: 127-->126-->125 yesterday. Pt received 1 dose of Tolvaptan 30 mg yesterday. Sodium is 130 today.  - Continue to monitor   3. Acute gout flare: Pain improved today, elbow appears slightly swollen still but improving. Pt received 3 days of Prednisone 40 mg, discontinued today. - Continue Allopurinol 100 mg daily   4. Possible early diverticulitis?: Pt having recurrent diarrhea yesterday and this AM with mild abdominal pain. CT Abd showed diverticulosis of colon with mild thickening of sigmoid colon suggestive of early diverticulitis. GI panel neg. C diff neg.  - Continue Zofran 4 mg IV prn  - Continue morphine  Q6H prn severe pain  - Continue Oxycodone 5 mg Q4H prn pain    5. CAD: Continue home atorvastatin 80 mg daily and ASA 81 mg daily.   6. HTN: BP 139/47 today.  - Continue Hydralazine 50 mg TID  - Continue Lasix gtt - Start Metolazone 5 mg daily  7. Type 2 DM: Blood sugars in 260-400s. Patient sneaking food according to nurses. He had BG 414 last night and was given Novolog 20 units.  - Continue Lantus 13 units QHS  - Continue insulin sliding scale   8. GERD:  -  Continue famotidine .   9. Asthma:  - Continue albuterol 2.5mg  q2h prn for wheezing.   Diet: Carb modified  DVT/PE PPx: heparin SQ  Dispo: Deferred at this time. Awaiting resolution of medical problems.    The patient does have a current PCP Baltazar Apo, MD) and does need an [facility] hospital follow-up appointment after discharge.  The patient does not have transportation limitations that hinder transportation to clinic appointments.  .Services Needed at time of discharge: Y = Yes, Blank = No PT:   OT:   RN:   Equipment:   Other:     LOS: 5 days   Rich Number, MD 09/19/2013, 11:13 AM

## 2013-09-19 NOTE — Progress Notes (Signed)
Came to ambulate, awoke pt, he sts he is hurting too bad, c/o leg pain and HA. Asked for pain meds. Sts he would like to walk later. Gave pt permission to walk with staff or family. Ethelda Chick CES, ACSM 2:59 PM 09/19/2013

## 2013-09-19 NOTE — Progress Notes (Signed)
Advanced Heart Failure Rounding Note   Subjective:    [name] is a 48y/o male with HTN, DM2, polysunbstance abuse with longstanding HF due to NICM (probably ETOH), EF 10%. Current cocaine abuse. Was previously on [facility].   Admitted with ab pain & n/v/diarrhea. Weight up to 231. No response to oral diuretics. + edema, fatigue. Multiple runs NSVT. Cr 2.11->2.84-3.10   Noncontrast CT ab: Diverticulosis of the colon with mild thickening of the sigmoid colon suggesting early diverticulitis. No abscess. Hepatic cirrhosis with mild ascites and edema throughout the subcutaneous soft tissues. Renal function stable.   Started on milrinone on 09/15/13 for co-ox 51%. Remains on lasix gtt 15 mg/hr. Weight down 4 lbs (total 13 lbs) and 24 hr I/O -.12 liters. Gave one dose of tolvaptan and did not have much UOP documented, however patient went to the bathroom a lot and was not recorded. Diarrhea better. Patient has not been following low salt diet. SBP 120-130s.   Objective:   Weight Range:  Vital Signs:   Temp:  [97.3 F (36.3 C)-98 F (36.7 C)] 97.9 F (36.6 C) (09/02 0719) Pulse Rate:  [105-119] 105 (09/02 0719) Resp:  [18] 18 (09/01 2000) BP: (108-139)/(47-87) 139/47 mmHg (09/02 0719) SpO2:  [95 %-100 %] 95 % (09/02 0719) Weight:  [228 lb 8 oz (103.647 kg)] 228 lb 8 oz (103.647 kg) (09/02 0500) Last BM Date: 09/14/13  Weight change: Filed Weights   09/17/13 0400 09/18/13 0405 09/19/13 0500  Weight: 241 lb 10 oz (109.6 kg) 232 lb 8 oz (105.461 kg) 228 lb 8 oz (103.647 kg)    Intake/Output:   Intake/Output Summary (Last 24 hours) at 09/19/13 0756 Last data filed at 09/19/13 0622  Gross per 24 hour  Intake 1236.7 ml  Output   2475 ml  Net -1238.3 ml     Physical Exam: CVP 16 General: Chronically ill appearing. No resp difficulty;  HEENT: normal  Neck: supple. RIJ central line. JVP to ear. Carotids 2+ bilaterally; no bruits. No lymphadenopathy or thryomegaly appreciated.   Cor: PMI normal. Tachy & regular rhythm. 2/6 systolic murmur LLSB. + S3  Lungs: clear  Abdomen: obese, soft, nontender, + distended. No hepatosplenomegaly. No bruits or masses. Good bowel sounds.  Extremities: no cyanosis, clubbing, rash. 2+ bilateral edema with TED hose; L elbow sore to touch swollen  Neuro: alert & orientedx3, cranial nerves grossly intact. Moves all 4 extremities w/o difficulty. Affect pleasant.when   Telemetry: ST 110-120s  Labs: Basic Metabolic Panel:  Recent Labs Lab 09/16/13 0425 09/16/13 2120 09/17/13 0830 09/18/13 0400 09/19/13 0419  NA 131* 127* 126* 125* 130*  K 4.0 4.5 4.2 4.0 3.7  CL 91* 89* 87* 84* 86*  CO2 21 22 25 25 29   GLUCOSE 161* 242* 281* 293* 219*  BUN 53* 51* 53* 56* 59*  CREATININE 3.11* 2.56* 2.47* 2.09* 2.10*  CALCIUM 8.6 8.7 8.8 9.5 9.2  MG 2.1  --   --   --   --     Liver Function Tests:  Recent Labs Lab 09/14/13 1203 09/15/13 0105  AST 21 25  ALT 10 9  ALKPHOS 91 83  BILITOT 3.1* 2.8*  PROT 7.8 7.7  ALBUMIN 3.6 3.4*    Recent Labs Lab 09/14/13 1203 09/14/13 1923  LIPASE 70* 61*   No results found for this basename: AMMONIA,  in the last 168 hours  CBC:  Recent Labs Lab 09/14/13 1203 09/15/13 0105 09/16/13 0425 09/16/13 2250  WBC 4.3 4.1 5.2  5.0  NEUTROABS 2.9  --   --   --   HGB 16.9 15.7 15.2 14.8  HCT 50.7 45.7 44.4 43.2  MCV 85.1 83.2 83.5 83.2  PLT 194 183 183 194    Cardiac Enzymes:  Recent Labs Lab 09/14/13 1203 09/14/13 1923 09/15/13 0105 09/15/13 0732  TROPONINI <0.30 <0.30 <0.30 <0.30    BNP: BNP (last 3 results)  Recent Labs  07/12/13 1344 09/08/13 2224 09/14/13 1203  PROBNP 5773.0* 6632.0* 10023*     Other results:  EKG:   Imaging: No results found.   Medications:     Scheduled Medications: . allopurinol  100 mg Oral Daily  . aspirin EC  81 mg Oral Daily  . atorvastatin  80 mg Oral q1800  . Chlorhexidine Gluconate Cloth  6 each Topical Q0600  .  famotidine  20 mg Oral Daily  . gabapentin  200 mg Oral BID  . heparin  5,000 Units Subcutaneous 3 times per day  . hydrALAZINE  50 mg Oral TID  . insulin aspart  0-20 Units Subcutaneous TID WC  . insulin aspart  0-5 Units Subcutaneous QHS  . insulin glargine  13 Units Subcutaneous QHS  . isosorbide mononitrate  30 mg Oral Daily  . mupirocin ointment  1 application Nasal BID  . sodium chloride  3 mL Intravenous Q12H    Infusions: . furosemide (LASIX) infusion 15 mg/hr (09/18/13 1600)  . milrinone 0.25 mcg/kg/min (09/19/13 0622)    PRN Medications: sodium chloride, albuterol, loperamide, LORazepam, morphine injection, ondansetron (ZOFRAN) IV, oxyCODONE, sodium chloride   Assessment:   1. A/c systolic HF with low output physiology  --d/c weight in 6/15 was 206 pounds  2. NICM EF 10-15%  3. A/c renal failure, stage IV (baseline Cr ~2.0-2.5)  4. Hyperkalemia  5. NSVT  6. N/V/diarrhea - w/u for C. Diff in progress  7. H/o polysubstance abuse  8. Noncompliance  9. Hyponatremia  10. Acute gout in L elbow 11. NSVT  Plan/Discussion:    Presented in low output and was started on IV milrinone and transitioned to lasix gtt. Volume status improving however remains very volume overloaded. Will continue lasix gtt and milrinone currently. Give 5 mg of metolazone. One dose of tolvaptan yesterday, sodium now 130.   Renal function stable, will continue to follow.   Gout pain better, continue allopurinol.   SBP remains 120-130s will not titrate any medications currently.   Check Mag and supplement K+ having lots of PVCs.   Consult CR for ambulation. Place TED hose.   Length of Stay: 5 Aundria Rud NP-C 09/19/2013, 7:56 AM  Advanced Heart Failure Team Pager [phone] (M-F; 7a - 4p)  Please contact [facility] for night-coverage after hours (4p -7a ) and weekends on [email or web address]  Patient seen and examined with Ulla Potash, NP. We discussed all aspects of the encounter. I agree  with the assessment and plan as stated above.   He is diuresing on IV lasix and milrinone but continues to be noncompliant with his diet badgering the dietary staff for bacon and sausage. When I walked into his room he was eating french fries with extra salt loaded on them. I discussed this with him at length but he continues to make excuses. Will continue lasix and milrinone. Add metolazone.   Daniel Bensimhon,MD 1:46 PM

## 2013-09-20 ENCOUNTER — Encounter (HOSPITAL_COMMUNITY)

## 2013-09-20 DIAGNOSIS — R570 Cardiogenic shock: Secondary | ICD-10-CM

## 2013-09-20 LAB — BASIC METABOLIC PANEL
Anion gap: 15 (ref 5–15)
BUN: 60 mg/dL — AB (ref 6–23)
CHLORIDE: 83 meq/L — AB (ref 96–112)
CO2: 32 mEq/L (ref 19–32)
Calcium: 9 mg/dL (ref 8.4–10.5)
Creatinine, Ser: 1.85 mg/dL — ABNORMAL HIGH (ref 0.50–1.35)
GFR calc Af Amer: 49 mL/min — ABNORMAL LOW (ref >90)
GFR calc non Af Amer: 42 mL/min — ABNORMAL LOW (ref >90)
GLUCOSE: 228 mg/dL — AB (ref 70–99)
POTASSIUM: 3.3 meq/L — AB (ref 3.7–5.3)
Sodium: 130 mEq/L — ABNORMAL LOW (ref 137–147)

## 2013-09-20 LAB — CARBOXYHEMOGLOBIN
Carboxyhemoglobin: 1.5 % (ref 0.5–1.5)
Methemoglobin: 0.5 % (ref 0.0–1.5)
O2 Saturation: 82.2 %
Total hemoglobin: 15.5 g/dL (ref 13.5–18.0)

## 2013-09-20 LAB — GLUCOSE, CAPILLARY
Glucose-Capillary: 190 mg/dL — ABNORMAL HIGH (ref 70–99)
Glucose-Capillary: 302 mg/dL — ABNORMAL HIGH (ref 70–99)
Glucose-Capillary: 306 mg/dL — ABNORMAL HIGH (ref 70–99)
Glucose-Capillary: 185 mg/dL — ABNORMAL HIGH (ref 70–99)

## 2013-09-20 LAB — MAGNESIUM: Magnesium: 1.9 mg/dL (ref 1.5–2.5)

## 2013-09-20 LAB — TROPONIN I
Troponin I: <0.3 ng/mL (ref <0.30)
Troponin I: <0.3 ng/mL

## 2013-09-20 MED ORDER — POTASSIUM CHLORIDE CRYS ER 20 MEQ PO TBCR
40.0000 meq | EXTENDED_RELEASE_TABLET | Freq: Two times a day (BID) | ORAL | Status: AC
  Administered 2013-09-20 (×2): 40 meq via ORAL
  Filled 2013-09-20: qty 4
  Filled 2013-09-20: qty 2

## 2013-09-20 MED ORDER — INSULIN ASPART 100 UNIT/ML ~~LOC~~ SOLN
4.0000 [IU] | Freq: Three times a day (TID) | SUBCUTANEOUS | Status: DC
  Administered 2013-09-20 – 2013-09-29 (×26): 4 [IU] via SUBCUTANEOUS

## 2013-09-20 MED ORDER — METOLAZONE 5 MG PO TABS
5.0000 mg | ORAL_TABLET | Freq: Two times a day (BID) | ORAL | Status: DC
  Administered 2013-09-20 (×2): 5 mg via ORAL
  Filled 2013-09-20 (×4): qty 1

## 2013-09-20 MED ORDER — MAGNESIUM SULFATE IN D5W 10-5 MG/ML-% IV SOLN
1.0000 g | Freq: Once | INTRAVENOUS | Status: AC
  Administered 2013-09-20: 1 g via INTRAVENOUS
  Filled 2013-09-20: qty 100

## 2013-09-20 NOTE — Progress Notes (Signed)
Advanced Heart Failure Rounding Note   Subjective:    [name] is a 48y/o male with HTN, DM2, polysunbstance abuse with longstanding HF due to NICM (probably ETOH), EF 10%. Current cocaine abuse. Was previously on [facility].   Admitted with ab pain & n/v/diarrhea. Weight up to 231. No response to oral diuretics. + edema, fatigue. Multiple runs NSVT. Cr 2.11->2.84-3.10   Noncontrast CT ab: Diverticulosis of the colon with mild thickening of the sigmoid colon suggesting early diverticulitis. No abscess. Hepatic cirrhosis with mild ascites and edema throughout the subcutaneous soft tissues. Renal function stable.   Started on milrinone on 09/15/13 for co-ox 51%. Remains on lasix gtt 15 mg/hr and received 5 mg of metoalzone yesterday. Weight down 5 lbs (total 18 lbs) and 24 hr I/O -3.2 liters. Patient being very manipulative and has been putting salt on food and not following low salt diet. ST. Renal function trending down. Denies SOB or CP.    Objective:   Weight Range:  Vital Signs:   Temp:  [97.4 F (36.3 C)-98.3 F (36.8 C)] 98.1 F (36.7 C) (09/03 0722) Pulse Rate:  [107-128] 128 (09/03 0300) Resp:  [16-20] 20 (09/03 0722) BP: (103-126)/(58-80) 104/60 mmHg (09/03 0722) SpO2:  [94 %-100 %] 96 % (09/03 0722) Weight:  [222 lb 12.8 oz (101.061 kg)] 222 lb 12.8 oz (101.061 kg) (09/03 0140) Last BM Date: 09/19/13  Weight change: Filed Weights   09/18/13 0405 09/19/13 0500 09/20/13 0140  Weight: 232 lb 8 oz (105.461 kg) 228 lb 8 oz (103.647 kg) 222 lb 12.8 oz (101.061 kg)    Intake/Output:   Intake/Output Summary (Last 24 hours) at 09/20/13 0821 Last data filed at 09/20/13 0700  Gross per 24 hour  Intake 1120.23 ml  Output   4575 ml  Net -3454.77 ml     Physical Exam: CVP 20 General: Chronically ill appearing. No resp difficulty;  HEENT: normal  Neck: supple. RIJ central line. JVP to ear. Carotids 2+ bilaterally; no bruits. No lymphadenopathy or thryomegaly appreciated.   Cor: PMI normal. Tachy & regular rhythm. 2/6 systolic murmur LLSB. + S3  Lungs: clear  Abdomen: obese, soft, nontender, + distended. No hepatosplenomegaly. No bruits or masses. Good bowel sounds.  Extremities: no cyanosis, clubbing, rash. 2+ bilateral edema with TED hose; L elbow sore to touch swollen  Neuro: alert & orientedx3, cranial nerves grossly intact. Moves all 4 extremities w/o difficulty. Affect pleasant.when   Telemetry: ST 120s  Labs: Basic Metabolic Panel:  Recent Labs Lab 09/16/13 0425 09/16/13 2120 09/17/13 0830 09/18/13 0400 09/19/13 0419 09/20/13 0400  NA 131* 127* 126* 125* 130* 130*  K 4.0 4.5 4.2 4.0 3.7 3.3*  CL 91* 89* 87* 84* 86* 83*  CO2 21 22 25 25 29  32  GLUCOSE 161* 242* 281* 293* 219* 228*  BUN 53* 51* 53* 56* 59* 60*  CREATININE 3.11* 2.56* 2.47* 2.09* 2.10* 1.85*  CALCIUM 8.6 8.7 8.8 9.5 9.2 9.0  MG 2.1  --   --   --  1.9 1.9    Liver Function Tests:  Recent Labs Lab 09/14/13 1203 09/15/13 0105  AST 21 25  ALT 10 9  ALKPHOS 91 83  BILITOT 3.1* 2.8*  PROT 7.8 7.7  ALBUMIN 3.6 3.4*    Recent Labs Lab 09/14/13 1203 09/14/13 1923  LIPASE 70* 61*   No results found for this basename: AMMONIA,  in the last 168 hours  CBC:  Recent Labs Lab 09/14/13 1203 09/15/13 0105 09/16/13 0425  09/16/13 2250  WBC 4.3 4.1 5.2 5.0  NEUTROABS 2.9  --   --   --   HGB 16.9 15.7 15.2 14.8  HCT 50.7 45.7 44.4 43.2  MCV 85.1 83.2 83.5 83.2  PLT 194 183 183 194    Cardiac Enzymes:  Recent Labs Lab 09/14/13 1203 09/14/13 1923 09/15/13 0105 09/15/13 0732  TROPONINI <0.30 <0.30 <0.30 <0.30    BNP: BNP (last 3 results)  Recent Labs  07/12/13 1344 09/08/13 2224 09/14/13 1203  PROBNP 5773.0* 6632.0* 10023*     Other results:  EKG:   Imaging: No results found.   Medications:     Scheduled Medications: . allopurinol  100 mg Oral Daily  . aspirin EC  81 mg Oral Daily  . atorvastatin  80 mg Oral q1800  . Chlorhexidine  Gluconate Cloth  6 each Topical Q0600  . famotidine  20 mg Oral Daily  . gabapentin  200 mg Oral BID  . heparin  5,000 Units Subcutaneous 3 times per day  . hydrALAZINE  50 mg Oral TID  . insulin aspart  0-20 Units Subcutaneous TID WC  . insulin aspart  0-5 Units Subcutaneous QHS  . insulin glargine  20 Units Subcutaneous QHS  . isosorbide mononitrate  30 mg Oral Daily  . metolazone  5 mg Oral Daily  . mupirocin ointment  1 application Nasal BID  . sodium chloride  3 mL Intravenous Q12H    Infusions: . furosemide (LASIX) infusion 15 mg/hr (09/20/13 0138)  . milrinone 0.25 mcg/kg/min (09/20/13 0537)    PRN Medications: sodium chloride, albuterol, loperamide, LORazepam, morphine injection, ondansetron (ZOFRAN) IV, oxyCODONE, sodium chloride   Assessment:   1. A/c systolic HF with low output physiology  --d/c weight in 6/15 was 206 pounds  2. NICM EF 10-15%  3. A/c renal failure, stage IV (baseline Cr ~2.0-2.5)  4. Hyperkalemia  5. NSVT  6. N/V/diarrhea - w/u for C. Diff in progress  7. H/o polysubstance abuse  8. Noncompliance  9. Hyponatremia  10. Acute gout in L elbow 11. NSVT  Plan/Discussion:    Presented in low output and was started on IV milrinone and transitioned to lasix gtt. Volume status improving however remains very volume overloaded. Will continue lasix gtt and milrinone currently. Give 5 mg of metolazone BID again today. CVP 21.   Renal function stable, will continue to follow. Supplement K+  Gout pain better, continue allopurinol.   SBP remains 120-130s will not titrate any medications currently.   Supplement Mag  CR for ambulation. Place TED hose.   Length of Stay: 6 Aundria Rud NP-C 09/20/2013, 8:21 AM  Advanced Heart Failure Team Pager [phone] (M-F; 7a - 4p)  Please contact [facility] for night-coverage after hours (4p -7a ) and weekends on [email or web address]  Patient seen and examined with Ulla Potash, NP. We discussed all aspects of the  encounter. I agree with the assessment and plan as stated above.   Continues to diurese on IV lasix and milrinone. CVP still very high. Agree with increasing metolazone to bid.  Reinforced need for dietary compliance.   Angell Pincock,MD 10:05 AM

## 2013-09-20 NOTE — Progress Notes (Signed)
Pt seen and examined with Dr. Sherrine Maples. Please refer to the resident note for details. I have read their note and agree with their findings and assessment and plan with the following addition: - Pt feels tired today and expresses frustration at multiple doctors seeing him. Pt also complained of chest tightness today - Pt with cardiorenal syndrome. Cr now improving. C/w lasix gtt and milrinone gtt per Cardio. Given metolazone today as well - Pt reports AICD firing to hospice SW. Will interrogate AICD and inform heart failure team - c/w hydralazine, nitrate.   - Pt with hyponatremia- Improved s/p 1 dose of tolvaptan. Likely hypervolemic hyponatremia  - s/p prednisone for gout flare. C/w allopurinol. Elbow improving   - Diarrhea now resolved. Will monitor  - Pt with elevated BS. Will start standing novolog today in addition to continuing lantus and monitor

## 2013-09-20 NOTE — Plan of Care (Signed)
Problem: Food- and Nutrition-Related Knowledge Deficit (NB-1.1) Goal: Nutrition education Formal process to instruct or train a patient/client in a skill or to impart knowledge to help patients/clients voluntarily manage or modify food choices and eating behavior to maintain or improve health. Outcome: Completed/Met Date Met:  09/20/13 Nutrition Education Note  RD consulted for low sodium nutrition education.  Per pt he is raising three kids on his own. A 6, 69, and 35 year old. He is too tired to cook in the evening so he gets take out or eats out most of the time. We discussed foods to avoid and how to order foods lower in sodium. Pt feels he has to buy food from places his kids will eat such as [place], etc. Reviewed some of the foods that he typically eats out.   RD provided "Low Sodium Nutrition Therapy" handout from the Academy of [employer]etics. Reviewed patient's dietary recall. Provided examples on ways to decrease sodium intake in diet. Discouraged intake of processed foods and use of salt shaker. Encouraged fresh fruits and vegetables as well as whole grain sources of carbohydrates to maximize fiber intake.   RD discussed why it is important for patient to adhere to diet recommendations, and emphasized the role of fluids, foods to avoid. Teach back method used.  Expect poor to fair compliance.  Body mass index is 30.21 kg/(m^2). Pt meets criteria for obesity based on current BMI.  Current diet order is Heart Healthy/CHO Modified, patient is consuming approximately 100% of meals at this time. Labs and medications reviewed. No further nutrition interventions warranted at this time. RD contact information provided. If additional nutrition issues arise, please re-consult RD.   [place], [place], [place] Pager [phone] After Hours Pager

## 2013-09-20 NOTE — Progress Notes (Signed)
Subjective: Patient very agitated this morning. He states he does not like having so many doctors and would prefer just one person to be in charge. He is frustrated with his food restrictions as well. He states he has been avoiding salt and is upset that his food portions are so small. Pt states his diarrhea is improved and did not have any episodes last night or this AM. He reports the Immodium helped. He denies CP, palpitations, SOB, nausea, and vomiting.   Objective: Vital signs in last 24 hours: Filed Vitals:   09/19/13 2300 09/20/13 0140 09/20/13 0300 09/20/13 0722  BP: 107/71  103/80 104/60  Pulse: 118  128   Temp: 98 F (36.7 C)  98.3 F (36.8 C) 98.1 F (36.7 C)  TempSrc: Oral  Oral Oral  Resp: Height:      Weight:  222 lb 12.8 oz (101.061 kg)    SpO2: 94%  95% 96%   Weight change: -5 lb 11.2 oz (-2.586 kg)  Intake/Output Summary (Last 24 hours) at 09/20/13 0857 Last data filed at 09/20/13 0800  Gross per 24 hour  Intake 1120.23 ml  Output   5225 ml  Net -4104.77 ml   Physical Exam General: agitated, laying in bed HEENT: [place]/AT, EOMI, mucus membranes moist Neck: JVD to jaw CV: irregularly irregular rhythm, no m/g/r Pulm: CTA bilaterally, breaths non-labored Abd: BS+, soft, non-distended, non-tender Msk: warm, 2+ pitting edema in lower extremities bilaterally Neuro: alert and oriented x 3, agitated  Lab Results: Basic Metabolic Panel:  Recent Labs Lab 09/19/13 0419 09/20/13 0400  NA 130* 130*  K 3.7 3.3*  CL 86* 83*  CO2 29 32  GLUCOSE 219* 228*  BUN 59* 60*  CREATININE 2.10* 1.85*  CALCIUM 9.2 9.0  MG 1.9 1.9   Liver Function Tests:  Recent Labs Lab 09/14/13 1203 09/15/13 0105  AST 21 25  ALT 10 9  ALKPHOS 91 83  BILITOT 3.1* 2.8*  PROT 7.8 7.7  ALBUMIN 3.6 3.4*    Recent Labs Lab 09/14/13 1203 09/14/13 1923  LIPASE 70* 61*   No results found for this basename: AMMONIA,  in the last 168 hours CBC:  Recent Labs Lab  09/14/13 1203  09/16/13 0425 09/16/13 2250  WBC 4.3  < > 5.2 5.0  NEUTROABS 2.9  --   --   --   HGB 16.9  < > 15.2 14.8  HCT 50.7  < > 44.4 43.2  MCV 85.1  < > 83.5 83.2  PLT 194  < > 183 194  < > = values in this interval not displayed. Cardiac Enzymes:  Recent Labs Lab 09/14/13 1923 09/15/13 0105 09/15/13 0732  TROPONINI <0.30 <0.30 <0.30   BNP:  Recent Labs Lab 09/14/13 1203  PROBNP 10023*   D-Dimer: No results found for this basename: DDIMER,  in the last 168 hours CBG:  Recent Labs Lab 09/18/13 2347 09/19/13 0716 09/19/13 1203 09/19/13 1637 09/19/13 2120 09/20/13 0726  GLUCAP 378* 258* 224* 258* 322* 190*   Hemoglobin A1C: No results found for this basename: HGBA1C,  in the last 168 hours Fasting Lipid Panel: No results found for this basename: CHOL, HDL, LDLCALC, TRIG, CHOLHDL, LDLDIRECT,  in the last 168 hours Thyroid Function Tests: No results found for this basename: TSH, T4TOTAL, FREET4, T3FREE, THYROIDAB,  in the last 168 hours Coagulation: No results found for this basename: LABPROT, INR,  in the last 168 hours Anemia Panel: No results found  for this basename: VITAMINB12, FOLATE, FERRITIN, TIBC, IRON, RETICCTPCT,  in the last 168 hours Urine Drug Screen: Drugs of Abuse     Component Value Date/Time   LABOPIA NONE DETECTED 09/14/2013 2359   LABOPIA NEG 02/01/2012 1356   COCAINSCRNUR NONE DETECTED 09/14/2013 2359   COCAINSCRNUR PPS 02/01/2012 1356   LABBENZ NONE DETECTED 09/14/2013 2359   LABBENZ PPS 02/01/2012 1356   LABBENZ NEG 07/09/2010 1522   AMPHETMU NONE DETECTED 09/14/2013 2359   AMPHETMU NEG 07/09/2010 1522   THCU NONE DETECTED 09/14/2013 2359   LABBARB NONE DETECTED 09/14/2013 2359   LABBARB NEG 02/01/2012 1356    Alcohol Level: No results found for this basename: ETH,  in the last 168 hours Urinalysis:  Recent Labs Lab 09/14/13 2359  COLORURINE ORANGE*  LABSPEC 1.018  PHURINE 5.0  GLUCOSEU NEGATIVE  HGBUR TRACE*  BILIRUBINUR  MODERATE*  KETONESUR NEGATIVE  PROTEINUR >300*  UROBILINOGEN 1.0  NITRITE NEGATIVE  LEUKOCYTESUR NEGATIVE    Micro Results: Recent Results (from the past 240 hour(s))  MRSA PCR SCREENING     Status: Abnormal   Collection Time    09/15/13 11:01 AM      Result Value Ref Range Status   MRSA by PCR POSITIVE (*) NEGATIVE Final   Comment:            The GeneXpert MRSA Assay (FDA     approved for NASAL specimens     only), is one component of a     comprehensive MRSA colonization     surveillance program. It is not     intended to diagnose MRSA     infection nor to guide or     monitor treatment for     MRSA infections.     RESULT CALLED TO, READ BACK BY AND VERIFIED WITH:     BRODY J.,RN 09/15/13 1455 BY JONESJ  CLOSTRIDIUM DIFFICILE BY PCR     Status: None   Collection Time    09/15/13 11:15 AM      Result Value Ref Range Status   C difficile by pcr NEGATIVE  NEGATIVE Final   Studies/Results: No results found. Medications: I have reviewed the patient's current medications. Scheduled Meds: . allopurinol  100 mg Oral Daily  . aspirin EC  81 mg Oral Daily  . atorvastatin  80 mg Oral q1800  . Chlorhexidine Gluconate Cloth  6 each Topical Q0600  . famotidine  20 mg Oral Daily  . gabapentin  200 mg Oral BID  . heparin  5,000 Units Subcutaneous 3 times per day  . hydrALAZINE  50 mg Oral TID  . insulin aspart  0-20 Units Subcutaneous TID WC  . insulin aspart  0-5 Units Subcutaneous QHS  . insulin glargine  20 Units Subcutaneous QHS  . isosorbide mononitrate  30 mg Oral Daily  . magnesium sulfate 1 - 4 g bolus IVPB  1 g Intravenous Once  . metolazone  5 mg Oral BID  . mupirocin ointment  1 application Nasal BID  . potassium chloride  40 mEq Oral BID  . sodium chloride  3 mL Intravenous Q12H   Continuous Infusions: . furosemide (LASIX) infusion 15 mg/hr (09/20/13 0138)  . milrinone 0.25 mcg/kg/min (09/20/13 0537)   PRN Meds:.sodium chloride, albuterol, loperamide,  LORazepam, morphine injection, ondansetron (ZOFRAN) IV, oxyCODONE, sodium chloride Assessment/Plan: Mr. [place] is a 48yo man w/ PMHx of poorly controlled Type 2 DM, CKD Stage 3, chronic systolic non-ischemic cardiomyopathy with AICD (EF 10%) 2/2 polysubstance abuse (  alcohol, cocaine) who was admitted for abdominal pain, nausea, vomiting, and diarrhea and cardiorenal syndrome.   1. Cardiorenal syndrome: CHF complicated by CKD. Weight is 222 lb today, down 6 lbs from yesterday. Wt has been trending down: 241>232>228>222. He has diuresed 4.5 L in last 24 hours. Cr 1.85 today, down from 2.10 yesterday. Cr appears to be trending down overall. Mg stable at 1.9 on 9/3, given 1g Mg sulfate. Potassium low today at 3.3. He was given 30 mEq of K-Dur. Pt noted to have irregularly irregular rhythm on exam today.  - EKG ordered for irregularly irregular rhythm on exam --> likely due to PVCs.  - Heart Failure team following, appreciate recs  - Continue Hydralazine 50 mg TID  - Continue Metolazone 5 mg BID  - Continue Milirone  - Continue Lasix gtt  - Continue Imdur 30 mg daily  - Keep central line to access CVP and CO-Hb  - Avoid beta blocker due to low output  - Avoid ACE- inhibitor due to acute renal failure  - Continue strict I&Os  - Daily weights   2. Hyponatremia- Resolving: Patient asymptomatic. Patient's sodium had been steadily decreasing to 125, he received 1 dose of Tolvaptan 30 mg on 9/1. Na 130 yesterday and now stable at 130 today.  - Continue to monitor   3. Acute gout flare: Pain improved today, elbow appears slightly swollen still but improving. Pt received 3 days of Prednisone 40 mg, which was completed on 9/1.  - Continue Allopurinol 100 mg daily   4. Possible early diverticulitis?: Pt had recurrent diarrhea on 9/1 and 9/2 with mild abdominal pain. Pt states he has not had any more episodes of diarrhea. He was started on Immodium on 9/2 which seems to have helped. CT Abd showed  diverticulosis of colon with mild thickening of sigmoid colon suggestive of early diverticulitis. GI panel neg. C diff neg.  - Continue to monitor for recurrent diarrhea - Continue Zofran 4 mg IV prn  - Continue morphine  Q6H prn severe pain  - Continue Oxycodone 5 mg Q4H prn pain   5. CAD: Continue home atorvastatin 80 mg daily and ASA 81 mg daily.   6. HTN: BP 103/80 today.  - Continue Hydralazine 50 mg TID  - Continue Lasix gtt  - Continue Metolazone 5 mg daily   7. Type 2 DM: Blood sugars in 220-300s. Patient sneaking food according to nurses.  - Lantus increased to home dose of 20 units QHS  - Added Novolog 4 units TID with meals - Continue insulin sliding scale   8. GERD:  - Continue famotidine .   9. Asthma:  - Continue albuterol 2.5mg  q2h prn for wheezing.   Diet: Carb modified  DVT/PE PPx: heparin SQ  Dispo: Deferred at this time. Awaiting resolution of medical problems.    The patient does have a current PCP Baltazar Apo, MD) and does need an [facility] hospital follow-up appointment after discharge.  The patient does not have transportation limitations that hinder transportation to clinic appointments.  .Services Needed at time of discharge: Y = Yes, Blank = No PT:   OT:   RN:   Equipment:   Other:     LOS: 6 days   Rich Number, MD 09/20/2013, 8:57 AM

## 2013-09-20 NOTE — Progress Notes (Signed)
Inpatient RN visit- [name] 2H Room 23 -[facility] RN Visit-[name] RN  Related admission to [facility] diagnosis of Hypertrophic Cardiomyopathy. Pt is Full code.  Pt seen at bedside, lying in bed, able to respond to questions. Pt reports a "pounding " headache, had already used his call light to request pain medication, staff RN Gigi in to assess and give pain medication, pt recvd 2 mg of IV morphine, had previously recvd 5 mg of oxycodone for 10/10  headache, which he reports is still 10/10. Pt back to sleep after pain med.  CVP remains elevated at 21, pt remains on IV lasix and IV milrinone to manage volume overload. Per chart review pt to recv metolazone 5 mg BID today. No discharge plans at this time.  [facility] will continue to follow daily.  Patient's home medication list and transfer summary in place  on shadow chart.   Please call [facility] @ [phone]-with any hospice needs.   Thank you. Hansel Starling, RN  [facility]  Hospice Liaison  [phone])

## 2013-09-20 NOTE — Progress Notes (Signed)
PO fluid intake monitoring discussed w/PT . Pt made no reply .

## 2013-09-20 NOTE — Progress Notes (Signed)
RM [id number]-01 [name]                                           [facility] MSW Note:  Pt was eating lunch when Sw arrived. Pleasant,reported feeling very tired and intends to return to sleep following lunch.He informed that his heart stopped this am and was shocked by his defibrillator. He states that he does not intend to have it shut off and that his goal is to live for as long as he can. Inquired about any concerns or immediate needs. He voiced no none. Pt will be returning home following discharge. SW will continue to follow to assess for needs and extend support.Exie Parody [place],LCSW

## 2013-09-21 DIAGNOSIS — N189 Chronic kidney disease, unspecified: Secondary | ICD-10-CM

## 2013-09-21 LAB — BASIC METABOLIC PANEL WITH GFR
Anion gap: 12 (ref 5–15)
BUN: 58 mg/dL — ABNORMAL HIGH (ref 6–23)
CO2: 36 meq/L — ABNORMAL HIGH (ref 19–32)
Calcium: 8.6 mg/dL (ref 8.4–10.5)
Chloride: 81 meq/L — ABNORMAL LOW (ref 96–112)
Creatinine, Ser: 1.86 mg/dL — ABNORMAL HIGH (ref 0.50–1.35)
GFR calc Af Amer: 48 mL/min — ABNORMAL LOW
GFR calc non Af Amer: 42 mL/min — ABNORMAL LOW
Glucose, Bld: 251 mg/dL — ABNORMAL HIGH (ref 70–99)
Potassium: 3.6 meq/L — ABNORMAL LOW (ref 3.7–5.3)
Sodium: 129 meq/L — ABNORMAL LOW (ref 137–147)

## 2013-09-21 LAB — CARBOXYHEMOGLOBIN
Carboxyhemoglobin: 2 % — ABNORMAL HIGH (ref 0.5–1.5)
Methemoglobin: 0.6 % (ref 0.0–1.5)
O2 Saturation: 75.6 %
Total hemoglobin: 15.5 g/dL (ref 13.5–18.0)

## 2013-09-21 LAB — GLUCOSE, CAPILLARY
GLUCOSE-CAPILLARY: 241 mg/dL — AB (ref 70–99)
Glucose-Capillary: 231 mg/dL — ABNORMAL HIGH (ref 70–99)
Glucose-Capillary: 238 mg/dL — ABNORMAL HIGH (ref 70–99)
Glucose-Capillary: 287 mg/dL — ABNORMAL HIGH (ref 70–99)

## 2013-09-21 MED ORDER — METOLAZONE 5 MG PO TABS
5.0000 mg | ORAL_TABLET | Freq: Once | ORAL | Status: AC
  Administered 2013-09-21: 5 mg via ORAL
  Filled 2013-09-21: qty 1

## 2013-09-21 MED ORDER — POTASSIUM CHLORIDE CRYS ER 20 MEQ PO TBCR
40.0000 meq | EXTENDED_RELEASE_TABLET | Freq: Two times a day (BID) | ORAL | Status: AC
  Administered 2013-09-21 (×2): 40 meq via ORAL
  Filled 2013-09-21 (×2): qty 2

## 2013-09-21 NOTE — Progress Notes (Signed)
Inpatient RN visit- [facility] 2 H  Room 23-[facility] RN Visit-[name] RN  Related admission to [facility] diagnosis of Hypertrophic Cardiomyopathy. Pt is Full code.  Pt seen lying in bed awake, but drowsy,  with complaints of "pain all over",  rated 10/10.   Staff RN Lissa Hoard and [facility] Nursing Student Lisabeth Pick in to give PRN oxycodone 5 mg and scheduled po meds. Pt able to swallow w/o difficulty. Per chart review pt had 2 mg PRN IV morphine earlier this am. He has recvd 2-3 doses of PRN morphine a day for the last 3 days as well as the PRN oxycodone. He reports not sleeping well last night, appetite is good, no loose stools overnight. Pt remains on IV lasix and milrinone for management of fluid volume overload. CVP this am was 17 per staff RN Lissa Hoard.  Plan remains for pt to return home when medically stable. Pt resting with eyes closed at end of visit. [facility] will continue to follow daily.  No family present at time of visit, pt reports his sister, daughter and son visited with him yesterday.   Patient's home medication list and transfer summary in place on shadow chart.  Please call [facility] @ [phone]-  with any hospice needs.   Thank you. Hansel Starling, RN  [facility]  Hospice Liaison  [phone])

## 2013-09-21 NOTE — Progress Notes (Signed)
Pt seen and examined with Dr. Beckie Salts and  Dr. Sherrine Maples. Please refer to the resident note for details. I have read their note and agree with their findings and assessment and plan with the following addition:   Pt is upset that multiple doctors are seeing him everyday. He complains of pain "everywhere" but denies CP/abd pain. Diarrhea has improved  - Pt with cardiorenal syndrome. Cr now improving. To be weaned off milrinone gtt this weekend. - Lasix gtt to be dcd today. Patient to start torsemide - Resident discussed AICD firing with cardio yesterday. No further w/u for now - c/w hydralazine, nitrate.   - Pt with hyponatremia- Improved s/p 1 dose of tolvaptan. Likely hypervolemic hyponatremia   - s/p prednisone for gout flare. C/w allopurinol. Elbow improving   - Diarrhea now resolved. Will monitor   - BS improving. Will monitor. May need to increase novolog to 6 units

## 2013-09-21 NOTE — Progress Notes (Signed)
Inpatient Diabetes Program Recommendations  AACE/ADA: New Consensus Statement on Inpatient Glycemic Control (2013)  Target Ranges:  Prepandial:   less than 140 mg/dL      Peak postprandial:   less than 180 mg/dL (1-2 hours)      Critically ill patients:  140 - 180 mg/dL   Inpatient Diabetes Program Recommendations Insulin - Basal: consider increasing Lantus to 25 units  Insulin - Meal Coverage: . Thank you  Piedad Climes BSN, RN,CDE Inpatient Diabetes Coordinator [phone] (team pager)

## 2013-09-21 NOTE — Progress Notes (Signed)
Subjective: Patient still frustrated this AM because of having multiple healthcare providers. He was sitting up and eating grits this AM. He denies chest pain, palpitations, SOB, abdominal pain, and diarrhea.   Objective: Vital signs in last 24 hours: Filed Vitals:   09/20/13 1920 09/21/13 0000 09/21/13 0334 09/21/13 0400  BP: 114/56  117/64   Pulse:      Temp: 97.9 F (36.6 C) 97.8 F (36.6 C)  97.9 F (36.6 C)  TempSrc: Oral Oral  Oral  Resp:      Height:      Weight:    216 lb 8 oz (98.204 kg)  SpO2: 97%      Weight change: -6 lb 4.8 oz (-2.858 kg)  Intake/Output Summary (Last 24 hours) at 09/21/13 1154 Last data filed at 09/21/13 1128  Gross per 24 hour  Intake   2056 ml  Output   5625 ml  Net  -3569 ml   Physical Exam: General: quiet, agitated, sitting on edge of bed eating breakfast HEENT: [place]/AT, EOMI, mucus membranes moist Neck: JVD present to jawline CV: irregularly irregular rhythm, no m/g/r Pulm: CTA bilaterally, breaths non-labored Abd: BS+, soft, non-distended, non-tender Msk: warm, 2+ pitting edema in lower extremities bilaterally Neuro: alert and oriented x 3, agitated, CNs II-XII intact  Lab Results: Basic Metabolic Panel:  Recent Labs Lab 09/19/13 0419 09/20/13 0400 09/21/13 0435  NA 130* 130* 129*  K 3.7 3.3* 3.6*  CL 86* 83* 81*  CO2 29 32 36*  GLUCOSE 219* 228* 251*  BUN 59* 60* 58*  CREATININE 2.10* 1.85* 1.86*  CALCIUM 9.2 9.0 8.6  MG 1.9 1.9  --    Liver Function Tests:  Recent Labs Lab 09/14/13 1203 09/15/13 0105  AST 21 25  ALT 10 9  ALKPHOS 91 83  BILITOT 3.1* 2.8*  PROT 7.8 7.7  ALBUMIN 3.6 3.4*    Recent Labs Lab 09/14/13 1203 09/14/13 1923  LIPASE 70* 61*   No results found for this basename: AMMONIA,  in the last 168 hours CBC:  Recent Labs Lab 09/14/13 1203  09/16/13 0425 09/16/13 2250  WBC 4.3  < > 5.2 5.0  NEUTROABS 2.9  --   --   --   HGB 16.9  < > 15.2 14.8  HCT 50.7  < > 44.4 43.2  MCV 85.1   < > 83.5 83.2  PLT 194  < > 183 194  < > = values in this interval not displayed. Cardiac Enzymes:  Recent Labs Lab 09/15/13 0732 09/20/13 1130 09/20/13 1701  TROPONINI <0.30 <0.30 <0.30   BNP:  Recent Labs Lab 09/14/13 1203  PROBNP 10023*   D-Dimer: No results found for this basename: DDIMER,  in the last 168 hours CBG:  Recent Labs Lab 09/19/13 2120 09/20/13 0726 09/20/13 1135 09/20/13 1617 09/20/13 2112 09/21/13 0825  GLUCAP 322* 190* 302* 306* 185* 231*   Hemoglobin A1C: No results found for this basename: HGBA1C,  in the last 168 hours Fasting Lipid Panel: No results found for this basename: CHOL, HDL, LDLCALC, TRIG, CHOLHDL, LDLDIRECT,  in the last 168 hours Thyroid Function Tests: No results found for this basename: TSH, T4TOTAL, FREET4, T3FREE, THYROIDAB,  in the last 168 hours Coagulation: No results found for this basename: LABPROT, INR,  in the last 168 hours Anemia Panel: No results found for this basename: VITAMINB12, FOLATE, FERRITIN, TIBC, IRON, RETICCTPCT,  in the last 168 hours Urine Drug Screen: Drugs of Abuse     Component  Value Date/Time   LABOPIA NONE DETECTED 09/14/2013 2359   LABOPIA NEG 02/01/2012 1356   COCAINSCRNUR NONE DETECTED 09/14/2013 2359   COCAINSCRNUR PPS 02/01/2012 1356   LABBENZ NONE DETECTED 09/14/2013 2359   LABBENZ PPS 02/01/2012 1356   LABBENZ NEG 07/09/2010 1522   AMPHETMU NONE DETECTED 09/14/2013 2359   AMPHETMU NEG 07/09/2010 1522   THCU NONE DETECTED 09/14/2013 2359   LABBARB NONE DETECTED 09/14/2013 2359   LABBARB NEG 02/01/2012 1356    Alcohol Level: No results found for this basename: ETH,  in the last 168 hours Urinalysis:  Recent Labs Lab 09/14/13 2359  COLORURINE ORANGE*  LABSPEC 1.018  PHURINE 5.0  GLUCOSEU NEGATIVE  HGBUR TRACE*  BILIRUBINUR MODERATE*  KETONESUR NEGATIVE  PROTEINUR >300*  UROBILINOGEN 1.0  NITRITE NEGATIVE  LEUKOCYTESUR NEGATIVE    Micro Results: Recent Results (from the past 240  hour(s))  MRSA PCR SCREENING     Status: Abnormal   Collection Time    09/15/13 11:01 AM      Result Value Ref Range Status   MRSA by PCR POSITIVE (*) NEGATIVE Final   Comment:            The GeneXpert MRSA Assay (FDA     approved for NASAL specimens     only), is one component of a     comprehensive MRSA colonization     surveillance program. It is not     intended to diagnose MRSA     infection nor to guide or     monitor treatment for     MRSA infections.     RESULT CALLED TO, READ BACK BY AND VERIFIED WITH:     BRODY J.,RN 09/15/13 1455 BY JONESJ  CLOSTRIDIUM DIFFICILE BY PCR     Status: None   Collection Time    09/15/13 11:15 AM      Result Value Ref Range Status   C difficile by pcr NEGATIVE  NEGATIVE Final   Studies/Results: No results found. Medications: I have reviewed the patient's current medications. Scheduled Meds: . allopurinol  100 mg Oral Daily  . aspirin EC  81 mg Oral Daily  . atorvastatin  80 mg Oral q1800  . Chlorhexidine Gluconate Cloth  6 each Topical Q0600  . famotidine  20 mg Oral Daily  . gabapentin  200 mg Oral BID  . heparin  5,000 Units Subcutaneous 3 times per day  . hydrALAZINE  50 mg Oral TID  . insulin aspart  0-20 Units Subcutaneous TID WC  . insulin aspart  0-5 Units Subcutaneous QHS  . insulin aspart  4 Units Subcutaneous TID WC  . insulin glargine  20 Units Subcutaneous QHS  . isosorbide mononitrate  30 mg Oral Daily  . mupirocin ointment  1 application Nasal BID  . potassium chloride  40 mEq Oral BID  . sodium chloride  3 mL Intravenous Q12H   Continuous Infusions: . furosemide (LASIX) infusion 15 mg/hr (09/20/13 1806)  . milrinone 0.25 mcg/kg/min (09/21/13 0620)   PRN Meds:.sodium chloride, albuterol, loperamide, LORazepam, morphine injection, ondansetron (ZOFRAN) IV, oxyCODONE, sodium chloride Assessment/Plan: [name] is a 48yo man w/ PMHx of poorly controlled Type 2 DM, CKD Stage 3, chronic systolic non-ischemic  cardiomyopathy with AICD (EF 10%) 2/2 polysubstance abuse (alcohol, cocaine) who was admitted for abdominal pain, nausea, vomiting, and diarrhea and cardiorenal syndrome.   1. Cardiorenal syndrome: CHF complicated by CKD. Weight is 216 lb today, down 6 lbs from yesterday. Wt has been trending down:  241>232>228>222>216. Baseline weight appears to be ~205 lbs. He has diuresed 3.4 L in last 24 hours, -13.7 L since admission. Cr 1.86 today, similar to 1.85 yesterday. Cr appears to be trending down overall. Mg stable at 1.9 on 9/3, given 1g Mg sulfate. Potassium 3.6 today after given 40 mEq KCl yesterday. He was given an additional 40 mEq of K-Dur. Pt noted to have irregularly irregular rhythm on exam yesterday and today. Covering HF physician notified last night about patient reporting his AICD had gone off yesterday and had a few episodes of VTach on telemetry. HF did not think AICD interrogation necessary at this time. EKG yesterday showed PVCs.  - Heart Failure team following, appreciate recs  - Continue Hydralazine 50 mg TID  - One more dose Metolazone 5 mg today and then transition to Torsemide 80 mg PO BID tomorrow  - Continue Milirone --> wean off this weekend. Not a home Milirone candidate - Continue Lasix gtt until 15:00 today - Continue Imdur 30 mg daily  - Keep central line to access CVP and CO-Hb  - Avoid beta blocker due to low output  - Avoid ACE- inhibitor due to acute renal failure  - Continue strict I&Os  - Daily weights   2. Hyponatremia- Resolving: Patient asymptomatic. Patient's sodium had been steadily decreasing to 125, he received 1 dose of Tolvaptan 30 mg on 9/1. Na 129 today, 130 yesterday.   - Continue to monitor   3. Acute gout flare: Pain improved today, elbow appears slightly swollen still but improving. Pt received 3 days of Prednisone 40 mg, which was completed on 9/1.  - Continue Allopurinol 100 mg daily   4. Possible early diverticulitis?- Resolving: Pt had recurrent  diarrhea on 9/1 and 9/2 with mild abdominal pain. Pt states he has not had any more episodes of diarrhea. He was started on Immodium on 9/2 which seems to have helped. CT Abd showed diverticulosis of colon with mild thickening of sigmoid colon suggestive of early diverticulitis. GI panel neg. C diff neg.  - Continue to monitor for recurrent diarrhea  - Continue Zofran 4 mg IV prn  - Continue morphine  Q6H prn severe pain  - Continue Oxycodone 5 mg Q4H prn pain   5. CAD: Continue home atorvastatin 80 mg daily and ASA 81 mg daily.   6. HTN: BP 117/64 today.  - Continue Hydralazine 50 mg TID  - Continue Lasix gtt until 15:00 today - One more dose of Metolazone 5 mg today and then transition to Torsemide 80 mg PO BID tomorrow   7. Type 2 DM: Blood sugars 185-300s. Patient noncompliant with diet.  - Continue Lantus 20 units QHS  - Continue Novolog 4 units TID with meals  - Continue insulin sliding scale   8. GERD:  - Continue famotidine .   9. Asthma:  - Continue albuterol 2.5mg  q2h prn for wheezing.   Diet: Carb modified  DVT/PE PPx: heparin SQ  Dispo: Deferred at this time. Awaiting resolution of medical problems.  The patient does have a current PCP Baltazar Apo, MD) and does need an [facility] hospital follow-up appointment after discharge.  The patient does not have transportation limitations that hinder transportation to clinic appointments.  .Services Needed at time of discharge: Y = Yes, Blank = No PT:   OT:   RN:   Equipment:   Other:     LOS: 7 days   Rich Number, MD 09/21/2013, 11:54 AM

## 2013-09-21 NOTE — Progress Notes (Signed)
1500 Cardiac Rehab On arrival pt in bed sleeping. Discussed with pt's nurse and she states that he got IV Morphine at 2pm for generalized pain. She recommend to let pt rest. We will follow later. Beatrix Fetters, RN 09/21/2013 3:34 PM

## 2013-09-21 NOTE — Progress Notes (Signed)
Advanced Heart Failure Rounding Note   Subjective:    [name] is a 48y/o male with HTN, DM2, polysunbstance abuse with longstanding HF due to NICM (probably ETOH), EF 10%. Current cocaine abuse. Was previously on [facility].   Admitted with ab pain & n/v/diarrhea. Weight up to 231. No response to oral diuretics. + edema, fatigue. Multiple runs NSVT. Cr 2.11->2.84-3.10   Noncontrast CT ab: Diverticulosis of the colon with mild thickening of the sigmoid colon suggesting early diverticulitis. No abscess. Hepatic cirrhosis with mild ascites and edema throughout the subcutaneous soft tissues. Renal function stable.   Started on milrinone on 09/15/13 for co-ox 51%. Remains on lasix gtt 15 mg/hr and received 5 mg of metoalzone BID yesterday. Weight down 6 lbs (total 24 lbs) and 24 hr I/O -3.3 liters. Renal function stable. Denies SOB. Pain all over.    Objective:   Weight Range:  Vital Signs:   Temp:  [97.7 F (36.5 C)-98 F (36.7 C)] 97.9 F (36.6 C) (09/04 0400) Resp:  [18] 18 (09/03 1613) BP: (90-117)/(56-74) 117/64 mmHg (09/04 0334) SpO2:  [92 %-97 %] 97 % (09/03 1920) Weight:  [216 lb 8 oz (98.204 kg)] 216 lb 8 oz (98.204 kg) (09/04 0400) Last BM Date: 09/19/13  Weight change: Filed Weights   09/19/13 0500 09/20/13 0140 09/21/13 0400  Weight: 228 lb 8 oz (103.647 kg) 222 lb 12.8 oz (101.061 kg) 216 lb 8 oz (98.204 kg)    Intake/Output:   Intake/Output Summary (Last 24 hours) at 09/21/13 0749 Last data filed at 09/21/13 0700  Gross per 24 hour  Intake 2399.2 ml  Output   5775 ml  Net -3375.8 ml     Physical Exam: CVP 9-10 General: Chronically ill appearing. No resp difficulty;  HEENT: normal  Neck: supple. RIJ central line. JVP 9-10. Carotids 2+ bilaterally; no bruits. No lymphadenopathy or thryomegaly appreciated.  Cor: PMI normal. Tachy & regular rhythm. 2/6 systolic murmur LLSB. + S3  Lungs: clear  Abdomen: obese, soft, nontender, mildly distended. No  hepatosplenomegaly. No bruits or masses. Good bowel sounds.  Extremities: no cyanosis, clubbing, rash. 1+ bilateral edema; L elbow sore to touch swollen  Neuro: alert & orientedx3, cranial nerves grossly intact. Moves all 4 extremities w/o difficulty. Affect pleasant.when   Telemetry: ST 120 with NSVT  Labs: Basic Metabolic Panel:  Recent Labs Lab 09/16/13 0425  09/17/13 0830 09/18/13 0400 09/19/13 0419 09/20/13 0400 09/21/13 0435  NA 131*  < > 126* 125* 130* 130* 129*  K 4.0  < > 4.2 4.0 3.7 3.3* 3.6*  CL 91*  < > 87* 84* 86* 83* 81*  CO2 21  < > 32 36*  GLUCOSE 161*  < > 281* 293* 219* 228* 251*  BUN 53*  < > 53* 56* 59* 60* 58*  CREATININE 3.11*  < > 2.47* 2.09* 2.10* 1.85* 1.86*  CALCIUM 8.6  < > 8.8 9.5 9.2 9.0 8.6  MG 2.1  --   --   --  1.9 1.9  --   < > = values in this interval not displayed.  Liver Function Tests:  Recent Labs Lab 09/14/13 1203 09/15/13 0105  AST 21 25  ALT 10 9  ALKPHOS 91 83  BILITOT 3.1* 2.8*  PROT 7.8 7.7  ALBUMIN 3.6 3.4*    Recent Labs Lab 09/14/13 1203 09/14/13 1923  LIPASE 70* 61*   No results found for this basename: AMMONIA,  in the last 168 hours  CBC:  Recent Labs Lab 09/14/13 1203 09/15/13 0105 09/16/13 0425 09/16/13 2250  WBC 4.3 4.1 5.2 5.0  NEUTROABS 2.9  --   --   --   HGB 16.9 15.7 15.2 14.8  HCT 50.7 45.7 44.4 43.2  MCV 85.1 83.2 83.5 83.2  PLT 194 183 183 194    Cardiac Enzymes:  Recent Labs Lab 09/14/13 1923 09/15/13 0105 09/15/13 0732 09/20/13 1130 09/20/13 1701  TROPONINI <0.30 <0.30 <0.30 <0.30 <0.30    BNP: BNP (last 3 results)  Recent Labs  07/12/13 1344 09/08/13 2224 09/14/13 1203  PROBNP 5773.0* 6632.0* 10023*     Other results:  EKG:   Imaging: No results found.   Medications:     Scheduled Medications: . allopurinol  100 mg Oral Daily  . aspirin EC  81 mg Oral Daily  . atorvastatin  80 mg Oral q1800  . Chlorhexidine Gluconate Cloth  6 each Topical  Q0600  . famotidine  20 mg Oral Daily  . gabapentin  200 mg Oral BID  . heparin  5,000 Units Subcutaneous 3 times per day  . hydrALAZINE  50 mg Oral TID  . insulin aspart  0-20 Units Subcutaneous TID WC  . insulin aspart  0-5 Units Subcutaneous QHS  . insulin aspart  4 Units Subcutaneous TID WC  . insulin glargine  20 Units Subcutaneous QHS  . isosorbide mononitrate  30 mg Oral Daily  . metolazone  5 mg Oral BID  . mupirocin ointment  1 application Nasal BID  . sodium chloride  3 mL Intravenous Q12H    Infusions: . furosemide (LASIX) infusion 15 mg/hr (09/20/13 1806)  . milrinone 0.25 mcg/kg/min (09/21/13 0620)    PRN Medications: sodium chloride, albuterol, loperamide, LORazepam, morphine injection, ondansetron (ZOFRAN) IV, oxyCODONE, sodium chloride   Assessment:   1. A/c systolic HF with low output physiology  --d/c weight in 6/15 was 206 pounds  2. NICM EF 10-15%  3. A/c renal failure, stage IV (baseline Cr ~2.0-2.5)  4. Hyperkalemia  5. NSVT  6. N/V/diarrhea - w/u for C. Diff in progress  7. H/o polysubstance abuse  8. Noncompliance  9. Hyponatremia  10. Acute gout in L elbow 11. NSVT  Plan/Discussion:    Presented in low output and was started on IV milrinone and transitioned to lasix gtt. Volume status improving and CVP 9. In the past we got his weight down to around 208-214 lbs. We are approaching his dry weight, will continue lasix gtt until 1500 with one more dose of metolazone 5 mg and then will turn off and transition to PO torsemide 80 mg BID tomorrow.   Renal function stable, will continue to follow. Supplement K+  Complains of pain all over he has PRN pain meds ordered.  Very poor prognosis, however patient remains FULL CODE. Over the weekend would wean milrinone off he is not a home milrinone candidate d/t compliance, drug abuse and he is currently a hospice patient.    CR for ambulation.   Length of Stay: 7 Aundria Rud NP-C 09/21/2013, 7:49  AM  Advanced Heart Failure Team Pager [phone] (M-F; 7a - 4p)  Please contact [facility] for night-coverage after hours (4p -7a ) and weekends on [email or web address]  Patient seen and examined with Ulla Potash, NP. We discussed all aspects of the encounter. I agree with the assessment and plan as stated above.   Continues to diurese well. Will continue current regimen for 1-2 more days. Need to get him as dry as  possible. Not candidate for home inotropes. No evidence of ICD firing on tele.   Krystan Northrop,MD 9:54 AM

## 2013-09-22 LAB — BASIC METABOLIC PANEL
Anion gap: 12 (ref 5–15)
BUN: 54 mg/dL — AB (ref 6–23)
CALCIUM: 9.1 mg/dL (ref 8.4–10.5)
CO2: 38 mEq/L — ABNORMAL HIGH (ref 19–32)
Chloride: 77 mEq/L — ABNORMAL LOW (ref 96–112)
Creatinine, Ser: 1.9 mg/dL — ABNORMAL HIGH (ref 0.50–1.35)
GFR calc Af Amer: 47 mL/min — ABNORMAL LOW (ref >90)
GFR, EST NON AFRICAN AMERICAN: 41 mL/min — AB (ref >90)
Glucose, Bld: 279 mg/dL — ABNORMAL HIGH (ref 70–99)
Potassium: 3.5 mEq/L — ABNORMAL LOW (ref 3.7–5.3)
SODIUM: 127 meq/L — AB (ref 137–147)

## 2013-09-22 LAB — GLUCOSE, CAPILLARY
GLUCOSE-CAPILLARY: 159 mg/dL — AB (ref 70–99)
GLUCOSE-CAPILLARY: 248 mg/dL — AB (ref 70–99)
GLUCOSE-CAPILLARY: 504 mg/dL — AB (ref 70–99)
Glucose-Capillary: 275 mg/dL — ABNORMAL HIGH (ref 70–99)
Glucose-Capillary: 434 mg/dL — ABNORMAL HIGH (ref 70–99)
Glucose-Capillary: 503 mg/dL — ABNORMAL HIGH (ref 70–99)

## 2013-09-22 LAB — CARBOXYHEMOGLOBIN
CARBOXYHEMOGLOBIN: 2 % — AB (ref 0.5–1.5)
Methemoglobin: 0.6 % (ref 0.0–1.5)
O2 SAT: 77.9 %
Total hemoglobin: 15.9 g/dL (ref 13.5–18.0)

## 2013-09-22 MED ORDER — METOLAZONE 5 MG PO TABS
5.0000 mg | ORAL_TABLET | Freq: Every day | ORAL | Status: DC
Start: 2013-09-22 — End: 2013-09-29
  Administered 2013-09-22 – 2013-09-29 (×8): 5 mg via ORAL
  Filled 2013-09-22 (×9): qty 1

## 2013-09-22 MED ORDER — POTASSIUM CHLORIDE CRYS ER 20 MEQ PO TBCR
40.0000 meq | EXTENDED_RELEASE_TABLET | Freq: Once | ORAL | Status: AC
  Administered 2013-09-22: 40 meq via ORAL
  Filled 2013-09-22: qty 2

## 2013-09-22 MED ORDER — INSULIN ASPART 100 UNIT/ML ~~LOC~~ SOLN
8.0000 [IU] | Freq: Once | SUBCUTANEOUS | Status: AC
  Administered 2013-09-22: 8 [IU] via SUBCUTANEOUS

## 2013-09-22 MED ORDER — POTASSIUM CHLORIDE CRYS ER 20 MEQ PO TBCR: 40.0000 meq | EXTENDED_RELEASE_TABLET | Freq: Two times a day (BID) | ORAL | Status: DC

## 2013-09-22 MED ORDER — GABAPENTIN 100 MG PO CAPS
200.0000 mg | ORAL_CAPSULE | Freq: Three times a day (TID) | ORAL | Status: DC
  Administered 2013-09-22 – 2013-10-06 (×44): 200 mg via ORAL
  Filled 2013-09-22 (×45): qty 2

## 2013-09-22 MED ORDER — PREDNISONE 20 MG PO TABS
40.0000 mg | ORAL_TABLET | Freq: Every day | ORAL | Status: AC
  Administered 2013-09-22 – 2013-09-24 (×2): 40 mg via ORAL
  Filled 2013-09-22 (×3): qty 2

## 2013-09-22 NOTE — Progress Notes (Addendum)
Subjective: Patient complains of significant lower leg and foot pain. He will not let anyone touch his feet. He denies chest pain, SOB, palpitations, and diarrhea. He was much more pleasant this AM.   Objective: Vital signs in last 24 hours: Filed Vitals:   09/21/13 1630 09/21/13 1932 09/21/13 2313 09/22/13 0327  BP: 126/76 108/69 110/70 117/67  Pulse:  111 99 116  Temp: 97.8 F (36.6 C) 97.4 F (36.3 C) 98.5 F (36.9 C) 98.7 F (37.1 C)  TempSrc: Oral Oral Oral Oral  Resp: Height:      Weight:    218 lb 7.6 oz (99.1 kg)  SpO2: 96% 96% 94% 95%   Weight change: 1 lb 15.6 oz (0.896 kg)  Intake/Output Summary (Last 24 hours) at 09/22/13 0827 Last data filed at 09/22/13 1610  Gross per 24 hour  Intake 3033.4 ml  Output   6725 ml  Net -3691.6 ml   Physical Exam: General: alert, pleasant, sitting up eating breakfast HEENT: [place]/AT, EOMI, sclera anicteric, mucus membranes moist Neck: JVD to jaw CV: tachycardic, normal S1/S2, 2/6 systolic murmur Pulm: CTA bilaterally, breaths non-labored Abd: BS+, soft, non-distended, non-tender Msk: tenderness to palpation of lower extremities, not able to assess feet. 1+ pitting edema in lower extremities bilaterally Neuro: alert and oriented x 3, in better mood today, CNs II-XII intact  Lab Results: Basic Metabolic Panel:  Recent Labs Lab 09/19/13 0419 09/20/13 0400 09/21/13 0435 09/22/13 0419  NA 130* 130* 129* 127*  K 3.7 3.3* 3.6* 3.5*  CL 86* 83* 81* 77*  CO2 29 32 36* 38*  GLUCOSE 219* 228* 251* 279*  BUN 59* 60* 58* 54*  CREATININE 2.10* 1.85* 1.86* 1.90*  CALCIUM 9.2 9.0 8.6 9.1  MG 1.9 1.9  --   --    Liver Function Tests: No results found for this basename: AST, ALT, ALKPHOS, BILITOT, PROT, ALBUMIN,  in the last 168 hours No results found for this basename: LIPASE, AMYLASE,  in the last 168 hours No results found for this basename: AMMONIA,  in the last 168 hours CBC:  Recent Labs Lab 09/16/13 0425  09/16/13 2250  WBC 5.2 5.0  HGB 15.2 14.8  HCT 44.4 43.2  MCV 83.5 83.2  PLT 183 194   Cardiac Enzymes:  Recent Labs Lab 09/20/13 1130 09/20/13 1701  TROPONINI <0.30 <0.30   BNP: No results found for this basename: PROBNP,  in the last 168 hours D-Dimer: No results found for this basename: DDIMER,  in the last 168 hours CBG:  Recent Labs Lab 09/20/13 1617 09/20/13 2112 09/21/13 0825 09/21/13 1123 09/21/13 1639 09/21/13 2118  GLUCAP 306* 185* 231* 238* 287* 241*   Hemoglobin A1C: No results found for this basename: HGBA1C,  in the last 168 hours Fasting Lipid Panel: No results found for this basename: CHOL, HDL, LDLCALC, TRIG, CHOLHDL, LDLDIRECT,  in the last 168 hours Thyroid Function Tests: No results found for this basename: TSH, T4TOTAL, FREET4, T3FREE, THYROIDAB,  in the last 168 hours Coagulation: No results found for this basename: LABPROT, INR,  in the last 168 hours Anemia Panel: No results found for this basename: VITAMINB12, FOLATE, FERRITIN, TIBC, IRON, RETICCTPCT,  in the last 168 hours Urine Drug Screen: Drugs of Abuse     Component Value Date/Time   LABOPIA NONE DETECTED 09/14/2013 2359   LABOPIA NEG 02/01/2012 1356   COCAINSCRNUR NONE DETECTED 09/14/2013 2359   COCAINSCRNUR PPS 02/01/2012 1356   LABBENZ NONE DETECTED  09/14/2013 2359   LABBENZ PPS 02/01/2012 1356   LABBENZ NEG 07/09/2010 1522   AMPHETMU NONE DETECTED 09/14/2013 2359   AMPHETMU NEG 07/09/2010 1522   THCU NONE DETECTED 09/14/2013 2359   LABBARB NONE DETECTED 09/14/2013 2359   LABBARB NEG 02/01/2012 1356    Alcohol Level: No results found for this basename: ETH,  in the last 168 hours Urinalysis: No results found for this basename: COLORURINE, APPERANCEUR, LABSPEC, PHURINE, GLUCOSEU, HGBUR, BILIRUBINUR, KETONESUR, PROTEINUR, UROBILINOGEN, NITRITE, LEUKOCYTESUR,  in the last 168 hours   Micro Results: Recent Results (from the past 240 hour(s))  MRSA PCR SCREENING     Status: Abnormal     Collection Time    09/15/13 11:01 AM      Result Value Ref Range Status   MRSA by PCR POSITIVE (*) NEGATIVE Final   Comment:            The GeneXpert MRSA Assay (FDA     approved for NASAL specimens     only), is one component of a     comprehensive MRSA colonization     surveillance program. It is not     intended to diagnose MRSA     infection nor to guide or     monitor treatment for     MRSA infections.     RESULT CALLED TO, READ BACK BY AND VERIFIED WITH:     BRODY J.,RN 09/15/13 1455 BY JONESJ  CLOSTRIDIUM DIFFICILE BY PCR     Status: None   Collection Time    09/15/13 11:15 AM      Result Value Ref Range Status   C difficile by pcr NEGATIVE  NEGATIVE Final   Studies/Results: No results found. Medications: I have reviewed the patient's current medications. Scheduled Meds: . allopurinol  100 mg Oral Daily  . aspirin EC  81 mg Oral Daily  . atorvastatin  80 mg Oral q1800  . famotidine  20 mg Oral Daily  . gabapentin  200 mg Oral BID  . heparin  5,000 Units Subcutaneous 3 times per day  . hydrALAZINE  50 mg Oral TID  . insulin aspart  0-20 Units Subcutaneous TID WC  . insulin aspart  0-5 Units Subcutaneous QHS  . insulin aspart  4 Units Subcutaneous TID WC  . insulin glargine  20 Units Subcutaneous QHS  . isosorbide mononitrate  30 mg Oral Daily  . mupirocin ointment  1 application Nasal BID  . sodium chloride  3 mL Intravenous Q12H   Continuous Infusions: . furosemide (LASIX) infusion 15 mg/hr (09/22/13 0433)  . milrinone 0.25 mcg/kg/min (09/22/13 0648)   PRN Meds:.sodium chloride, albuterol, loperamide, LORazepam, morphine injection, ondansetron (ZOFRAN) IV, oxyCODONE, sodium chloride Assessment/Plan: Mr. Clinton is a 48yo man w/ PMHx of poorly controlled Type 2 DM, CKD Stage 3, chronic systolic non-ischemic cardiomyopathy with AICD (EF 10%) 2/2 polysubstance abuse (alcohol, cocaine) who was admitted for abdominal pain, nausea, vomiting, and diarrhea and  cardiorenal syndrome.   1. Cardiorenal syndrome: CHF complicated by CKD. Weight is 218 lb today, up 2 lbs from yesterday. Wt has been trending down overall. His peak weight was 241 lbs. Baseline weight appears to be ~205 lbs. He has diuresed 3.7 L in last 24 hours, -17.5 L since admission. Cr 1.90 today, similar to 1.86 yesterday. Cr appears to be trending down overall. Mg stable at 1.9 on 9/3, given 1g Mg sulfate. Potassium 3.5 today. Pt received 40 mEq of K-Dur yesterday.  - Heart Failure team following,  appreciate recs  - Continue Hydralazine 50 mg TID  - D/c'd Metolazone 5 mg yesterday, likely will be transitioned to Torsemide 80 mg PO BID today  - Continue Milrinone --> wean off this weekend. Not a home Milrinone candidate  - Lasix gtt still continued   - Continue Imdur 30 mg daily  - Keep central line to access CVP and CO-Hb  - Avoid beta blocker due to low output  - Avoid ACE- inhibitor due to acute renal failure  - Continue strict I&Os  - Daily weights   2. Hyponatremia: Patient asymptomatic. Patient's sodium had been steadily decreasing to 125 on 9/1 and he received 1 dose of Tolvaptan 30 mg. Na improved to 130. Na now trending down again, today 127. Hyponatremia likely 2/2 hypervolemia.  - Consider another dose of Tolvaptan if Na continues to decrease - Repeat bmet tomorrow AM - Continue to monitor   3. Lower extremity/foot pain: Pt complaining of increased lower extremity pain, especially in his feet bilaterally. He is currently on Gabapentin 200 mg BID, which is his home dose. Patient states he takes Gabapentin 300 mg TID at home. However, this is not consistent with med rec. Pain could be neuropathic, but more likely due to his hypervolemia/edema.  - Will increase Gabapentin to 200 mg TID  4. Acute gout flare: Pain improved today, elbow appears to be improving. Pt received 3 days of Prednisone 40 mg, which was completed on 9/1.  - Continue Allopurinol 100 mg daily   5. Possible  early diverticulitis?- Resolving: Pt had recurrent diarrhea on 9/1 and 9/2 with mild abdominal pain. Pt states he has not had any more episodes of diarrhea. He was started on Immodium on 9/2 which seems to have helped. CT Abd showed diverticulosis of colon with mild thickening of sigmoid colon suggestive of early diverticulitis. GI panel neg. C diff neg.  - Continue to monitor for recurrent diarrhea  - Continue Zofran 4 mg IV prn  - Continue morphine  Q6H prn severe pain  - Continue Oxycodone 5 mg Q4H prn pain   6. CAD: Continue home atorvastatin 80 mg daily and ASA 81 mg daily.   7. HTN: BP 117/67 today.  - Continue Hydralazine 50 mg TID  - Continue Lasix gtt for now, follow HF team recs - Metolazone 5 mg discontinued yesterday, likely will transition to Torsemide 80 mg PO BID today  8. Type 2 DM: Blood sugars in 230-280s. Patient noncompliant with diet.  - Continue Lantus 20 units QHS  - Continue Novolog 4 units TID with meals  - Continue insulin sliding scale   9. GERD:  - Continue famotidine   10. Asthma:  - Continue albuterol 2.5mg  q2h prn for wheezing.   Diet: Carb modified  DVT/PE PPx: Heparin SQ  Dispo: Deferred at this time. Awaiting resolution of medical problems.  The patient does have a current PCP Baltazar Apo, MD) and does need an [facility] hospital follow-up appointment after discharge.  The patient does not have transportation limitations that hinder transportation to clinic appointments.  .Services Needed at time of discharge: Y = Yes, Blank = No PT:   OT:   RN:   Equipment:   Other:     LOS: 8 days   Rich Number, MD 09/22/2013, 8:27 AM

## 2013-09-22 NOTE — Progress Notes (Signed)
Patient made aware that his bed sure be in low position for safety .  Patient refuses to have  Bed in low position .

## 2013-09-22 NOTE — Progress Notes (Signed)
Related admission to [facility] diagnosis of hypertrophic cardiomyopathy.  Patient is a full code.  Found patient resting in bed with lights off.  No family present.  Reviewed chart and patient continues to diurese well with plan to continue Lasix gtt for today.  Patient to receive Milrinone wean beginning tomorrow.  Anticipate discharge home with continued Hospice services.  Continue current plan of care and encourage a call to Hospice at [phone] with any needs.  April Costella Hatcher, RN, BSN Hospice

## 2013-09-22 NOTE — Progress Notes (Signed)
Advanced Heart Failure Rounding Note   Subjective:    [name] is a 48y/o male with HTN, DM2, polysunbstance abuse with longstanding HF due to NICM (probably ETOH), EF 10%. Current cocaine abuse. Was previously on [facility].   Admitted with ab pain & n/v/diarrhea. Weight up to 231. No response to oral diuretics. + edema, fatigue. Multiple runs NSVT. Cr 2.11->2.84-3.10   Noncontrast CT ab: Diverticulosis of the colon with mild thickening of the sigmoid colon suggesting early diverticulitis. No abscess. Hepatic cirrhosis with mild ascites and edema throughout the subcutaneous soft tissues. Renal function stable.   Remains on lasix gtt and milrinone. Metolazone stopped. Continues to refuse various things. Weight up 2 pounds overnight. C/o severe pain in feet. Soaking in warm water.  Co-ox 78%   Objective:   Weight Range:  Vital Signs:   Temp:  [97.4 F (36.3 C)-98.7 F (37.1 C)] 98.1 F (36.7 C) (09/05 0800) Pulse Rate:  [99-118] 118 (09/05 0800) Resp:  [18-20] 18 (09/05 0800) BP: (108-126)/(67-84) 108/84 mmHg (09/05 0800) SpO2:  [94 %-96 %] 94 % (09/05 0800) Weight:  [99.1 kg (218 lb 7.6 oz)] 99.1 kg (218 lb 7.6 oz) (09/05 0327) Last BM Date: 09/19/13  Weight change: Filed Weights   09/20/13 0140 09/21/13 0400 09/22/13 0327  Weight: 101.061 kg (222 lb 12.8 oz) 98.204 kg (216 lb 8 oz) 99.1 kg (218 lb 7.6 oz)    Intake/Output:   Intake/Output Summary (Last 24 hours) at 09/22/13 1116 Last data filed at 09/22/13 1106  Gross per 24 hour  Intake 2693.4 ml  Output   7775 ml  Net -5081.6 ml     Physical Exam: CVP 9-10 General: Chronically ill appearing. No resp difficulty;  HEENT: normal  Neck: supple. RIJ central line. JVP 9-10. Carotids 2+ bilaterally; no bruits. No lymphadenopathy or thryomegaly appreciated.  Cor: PMI normal. Tachy & regular rhythm. 2/6 systolic murmur LLSB. + S3  Lungs: clear  Abdomen: obese, soft, nontender, non distended. No hepatosplenomegaly. No  bruits or masses. Good bowel sounds.  Extremities: no cyanosis, clubbing, rash. No edema; both feet and 1st MTP sore to touch  Neuro: alert & orientedx3, cranial nerves grossly intact. Moves all 4 extremities w/o difficulty. Affect pleasant.when   Telemetry: ST 100-120 with PVCs  Labs: Basic Metabolic Panel:  Recent Labs Lab 09/16/13 0425  09/18/13 0400 09/19/13 0419 09/20/13 0400 09/21/13 0435 09/22/13 0419  NA 131*  < > 125* 130* 130* 129* 127*  K 4.0  < > 4.0 3.7 3.3* 3.6* 3.5*  CL 91*  < > 84* 86* 83* 81* 77*  CO2 21  < > 25 29 32 36* 38*  GLUCOSE 161*  < > 293* 219* 228* 251* 279*  BUN 53*  < > 56* 59* 60* 58* 54*  CREATININE 3.11*  < > 2.09* 2.10* 1.85* 1.86* 1.90*  CALCIUM 8.6  < > 9.5 9.2 9.0 8.6 9.1  MG 2.1  --   --  1.9 1.9  --   --   < > = values in this interval not displayed.  Liver Function Tests: No results found for this basename: AST, ALT, ALKPHOS, BILITOT, PROT, ALBUMIN,  in the last 168 hours No results found for this basename: LIPASE, AMYLASE,  in the last 168 hours No results found for this basename: AMMONIA,  in the last 168 hours  CBC:  Recent Labs Lab 09/16/13 0425 09/16/13 2250  WBC 5.2 5.0  HGB 15.2 14.8  HCT 44.4 43.2  MCV  83.5 83.2  PLT 183 194    Cardiac Enzymes:  Recent Labs Lab 09/20/13 1130 09/20/13 1701  TROPONINI <0.30 <0.30    BNP: BNP (last 3 results)  Recent Labs  07/12/13 1344 09/08/13 2224 09/14/13 1203  PROBNP 5773.0* 6632.0* 10023*     Other results:  EKG:   Imaging: No results found.   Medications:     Scheduled Medications: . allopurinol  100 mg Oral Daily  . aspirin EC  81 mg Oral Daily  . atorvastatin  80 mg Oral q1800  . famotidine  20 mg Oral Daily  . gabapentin  200 mg Oral TID  . heparin  5,000 Units Subcutaneous 3 times per day  . hydrALAZINE  50 mg Oral TID  . insulin aspart  0-20 Units Subcutaneous TID WC  . insulin aspart  0-5 Units Subcutaneous QHS  . insulin aspart  4 Units  Subcutaneous TID WC  . insulin glargine  20 Units Subcutaneous QHS  . isosorbide mononitrate  30 mg Oral Daily  . potassium chloride  40 mEq Oral BID  . sodium chloride  3 mL Intravenous Q12H    Infusions: . furosemide (LASIX) infusion 15 mg/hr (09/22/13 0433)  . milrinone 0.25 mcg/kg/min (09/22/13 0648)    PRN Medications: sodium chloride, albuterol, loperamide, LORazepam, morphine injection, ondansetron (ZOFRAN) IV, oxyCODONE, sodium chloride   Assessment:   1. A/c systolic HF with low output physiology  --d/c weight in 6/15 was 206 pounds  2. NICM EF 10-15%  3. A/c renal failure, stage IV (baseline Cr ~2.0-2.5)  4. Hyperkalemia  5. NSVT  6. N/V/diarrhea - w/u for C. Diff in progress  7. H/o polysubstance abuse  8. Noncompliance  9. Hyponatremia  10. Acute gout 11. NSVT  Plan/Discussion:    Continues to diurese well. Weight down to near baseline. Will continue lasix gtt today and give one more dose metolazone. Switch to po tomorrow.   He has severe gout in feet. Restart prednisone.   Will start milrinone wean tomorrow. Suspect it may not go well. He is not candidate for home inotropes.   Daniel Bensimhon,MD 11:16 AM

## 2013-09-22 NOTE — Progress Notes (Signed)
Patient refused to let nurse do CVP

## 2013-09-23 LAB — BASIC METABOLIC PANEL
Anion gap: 12 (ref 5–15)
BUN: 60 mg/dL — AB (ref 6–23)
CO2: 39 mEq/L — ABNORMAL HIGH (ref 19–32)
CREATININE: 1.94 mg/dL — AB (ref 0.50–1.35)
Calcium: 9.5 mg/dL (ref 8.4–10.5)
Chloride: 76 mEq/L — ABNORMAL LOW (ref 96–112)
GFR calc non Af Amer: 40 mL/min — ABNORMAL LOW (ref >90)
GFR, EST AFRICAN AMERICAN: 46 mL/min — AB (ref >90)
GLUCOSE: 137 mg/dL — AB (ref 70–99)
Potassium: 3.2 mEq/L — ABNORMAL LOW (ref 3.7–5.3)
Sodium: 127 mEq/L — ABNORMAL LOW (ref 137–147)

## 2013-09-23 LAB — CARBOXYHEMOGLOBIN
Carboxyhemoglobin: 2 % — ABNORMAL HIGH (ref 0.5–1.5)
Methemoglobin: 0.5 % (ref 0.0–1.5)
O2 SAT: 83.9 %
Total hemoglobin: 16.1 g/dL (ref 13.5–18.0)

## 2013-09-23 LAB — GLUCOSE, CAPILLARY
GLUCOSE-CAPILLARY: 235 mg/dL — AB (ref 70–99)
GLUCOSE-CAPILLARY: 255 mg/dL — AB (ref 70–99)
GLUCOSE-CAPILLARY: 426 mg/dL — AB (ref 70–99)
Glucose-Capillary: 133 mg/dL — ABNORMAL HIGH (ref 70–99)
Glucose-Capillary: 193 mg/dL — ABNORMAL HIGH (ref 70–99)
Glucose-Capillary: 304 mg/dL — ABNORMAL HIGH (ref 70–99)

## 2013-09-23 MED ORDER — POTASSIUM CHLORIDE CRYS ER 20 MEQ PO TBCR
40.0000 meq | EXTENDED_RELEASE_TABLET | Freq: Two times a day (BID) | ORAL | Status: AC
  Administered 2013-09-23 (×2): 40 meq via ORAL
  Filled 2013-09-23 (×2): qty 2

## 2013-09-23 MED ORDER — DEXTROSE 5 % IV SOLN: 500.0000 mg | INTRAVENOUS | Status: DC

## 2013-09-23 MED ORDER — INSULIN ASPART 100 UNIT/ML ~~LOC~~ SOLN
12.0000 [IU] | Freq: Once | SUBCUTANEOUS | Status: AC
  Administered 2013-09-23: 12 [IU] via SUBCUTANEOUS

## 2013-09-23 NOTE — Progress Notes (Signed)
Pt CBG at 2130, 434. IM Resident on-call notified and order placed to give 5 units of insulin and recheck CBG in 1 hr. At 2300 PT CBG, 504. IM Resident on-call order for 8 additional units and to recheck CBG in 1hr. Pt also complaining of 10/10 gout pain and very upset that Prednisone was not order for him. IM Resident on-call was made aware of pt's complaint. Will continue to monitor pt.

## 2013-09-23 NOTE — Progress Notes (Signed)
Subjective: Overnight, patient had blood glucoses in 400-500s. He was given 25 units Novolog total. Blood glucose 133 this AM. Patient reports his feet are still in a lot of pain. He is refusing Prednisone because it elevates his blood sugars. He denies chest pain, SOB, palpitations, diarrhea.  Objective: Vital signs in last 24 hours: Filed Vitals:   09/23/13 0438 09/23/13 0452 09/23/13 0735 09/23/13 0737  BP:  121/64 129/74   Pulse:  107 109   Temp:    98.2 F (36.8 C)  TempSrc:    Oral  Resp:      Height:      Weight: 210 lb 12.2 oz (95.6 kg)     SpO2:   92%    Weight change: -7 lb 11.5 oz (-3.5 kg)  Intake/Output Summary (Last 24 hours) at 09/23/13 1351 Last data filed at 09/23/13 0700  Gross per 24 hour  Intake  912.6 ml  Output   3250 ml  Net -2337.4 ml   Physical Exam: General: alert, sitting up in bed HEENT: [place]/AT, EOMI, sclera anicteric, mucus membranes moist  Neck: JVD to jaw  CV: tachycardic, normal S1/S2, 2/6 systolic murmur  Pulm: CTA bilaterally, breaths non-labored  Abd: BS+, soft, non-distended, non-tender  Msk: tenderness to palpation of lower extremities, patient will not allow me to assess feet. 1+ pitting edema in lower extremities bilaterally  Neuro: alert and oriented x 3, CNs II-XII intact  Lab Results: Basic Metabolic Panel:  Recent Labs Lab 09/19/13 0419 09/20/13 0400  09/22/13 0419 09/23/13 0514  NA 130* 130*  < > 127* 127*  K 3.7 3.3*  < > 3.5* 3.2*  CL 86* 83*  < > 77* 76*  CO2 29 32  < > 38* 39*  GLUCOSE 219* 228*  < > 279* 137*  BUN 59* 60*  < > 54* 60*  CREATININE 2.10* 1.85*  < > 1.90* 1.94*  CALCIUM 9.2 9.0  < > 9.1 9.5  MG 1.9 1.9  --   --   --   < > = values in this interval not displayed. Liver Function Tests: No results found for this basename: AST, ALT, ALKPHOS, BILITOT, PROT, ALBUMIN,  in the last 168 hours No results found for this basename: LIPASE, AMYLASE,  in the last 168 hours No results found for this basename:  AMMONIA,  in the last 168 hours CBC:  Recent Labs Lab 09/16/13 2250  WBC 5.0  HGB 14.8  HCT 43.2  MCV 83.2  PLT 194   Cardiac Enzymes:  Recent Labs Lab 09/20/13 1130 09/20/13 1701  TROPONINI <0.30 <0.30   BNP: No results found for this basename: PROBNP,  in the last 168 hours D-Dimer: No results found for this basename: DDIMER,  in the last 168 hours CBG:  Recent Labs Lab 09/22/13 2308 09/22/13 2310 09/23/13 0026 09/23/13 0244 09/23/13 0729 09/23/13 1207  GLUCAP 504* 503* 426* 235* 133* 193*   Hemoglobin A1C: No results found for this basename: HGBA1C,  in the last 168 hours Fasting Lipid Panel: No results found for this basename: CHOL, HDL, LDLCALC, TRIG, CHOLHDL, LDLDIRECT,  in the last 168 hours Thyroid Function Tests: No results found for this basename: TSH, T4TOTAL, FREET4, T3FREE, THYROIDAB,  in the last 168 hours Coagulation: No results found for this basename: LABPROT, INR,  in the last 168 hours Anemia Panel: No results found for this basename: VITAMINB12, FOLATE, FERRITIN, TIBC, IRON, RETICCTPCT,  in the last 168 hours Urine Drug Screen: Drugs of  Abuse     Component Value Date/Time   LABOPIA NONE DETECTED 09/14/2013 2359   LABOPIA NEG 02/01/2012 1356   COCAINSCRNUR NONE DETECTED 09/14/2013 2359   COCAINSCRNUR PPS 02/01/2012 1356   LABBENZ NONE DETECTED 09/14/2013 2359   LABBENZ PPS 02/01/2012 1356   LABBENZ NEG 07/09/2010 1522   AMPHETMU NONE DETECTED 09/14/2013 2359   AMPHETMU NEG 07/09/2010 1522   THCU NONE DETECTED 09/14/2013 2359   LABBARB NONE DETECTED 09/14/2013 2359   LABBARB NEG 02/01/2012 1356    Alcohol Level: No results found for this basename: ETH,  in the last 168 hours Urinalysis: No results found for this basename: COLORURINE, APPERANCEUR, LABSPEC, PHURINE, GLUCOSEU, HGBUR, BILIRUBINUR, KETONESUR, PROTEINUR, UROBILINOGEN, NITRITE, LEUKOCYTESUR,  in the last 168 hours   Micro Results: Recent Results (from the past 240 hour(s))  MRSA  PCR SCREENING     Status: Abnormal   Collection Time    09/15/13 11:01 AM      Result Value Ref Range Status   MRSA by PCR POSITIVE (*) NEGATIVE Final   Comment:            The GeneXpert MRSA Assay (FDA     approved for NASAL specimens     only), is one component of a     comprehensive MRSA colonization     surveillance program. It is not     intended to diagnose MRSA     infection nor to guide or     monitor treatment for     MRSA infections.     RESULT CALLED TO, READ BACK BY AND VERIFIED WITH:     BRODY J.,RN 09/15/13 1455 BY JONESJ  CLOSTRIDIUM DIFFICILE BY PCR     Status: None   Collection Time    09/15/13 11:15 AM      Result Value Ref Range Status   C difficile by pcr NEGATIVE  NEGATIVE Final   Studies/Results: No results found. Medications: I have reviewed the patient's current medications. Scheduled Meds: . allopurinol  100 mg Oral Daily  . aspirin EC  81 mg Oral Daily  . atorvastatin  80 mg Oral q1800  . famotidine  20 mg Oral Daily  . gabapentin  200 mg Oral TID  . heparin  5,000 Units Subcutaneous 3 times per day  . hydrALAZINE  50 mg Oral TID  . insulin aspart  0-20 Units Subcutaneous TID WC  . insulin aspart  0-5 Units Subcutaneous QHS  . insulin aspart  4 Units Subcutaneous TID WC  . insulin glargine  20 Units Subcutaneous QHS  . isosorbide mononitrate  30 mg Oral Daily  . metolazone  5 mg Oral Q breakfast  . potassium chloride  40 mEq Oral BID  . predniSONE  40 mg Oral Q breakfast  . sodium chloride  3 mL Intravenous Q12H   Continuous Infusions: . milrinone 0.125 mcg/kg/min (09/23/13 1230)   PRN Meds:.sodium chloride, albuterol, loperamide, LORazepam, morphine injection, ondansetron (ZOFRAN) IV, oxyCODONE, sodium chloride Assessment/Plan: Mr. Ratte is a 48yo man w/ PMHx of poorly controlled Type 2 DM, CKD Stage 3, chronic systolic non-ischemic cardiomyopathy with AICD (EF 10%) 2/2 polysubstance abuse (alcohol, cocaine) who was admitted for abdominal  pain, nausea, vomiting, and diarrhea and cardiorenal syndrome.   1. Cardiorenal syndrome: CHF complicated by CKD. Weight is 210 lb today, down 8 lbs from yesterday. Wt has been trending down overall. His peak weight was 241 lbs. Baseline weight appears to be ~205 lbs. He has diuresed 4.9 L in  last 24 hours, -21.2 L since admission. Cr 1.94 today, similar to 1.90 yesterday. Cr appears to be trending down overall. Mg stable at 1.9 on 9/3, given 1g Mg sulfate. Potassium 3.2 today. Pt scheduled to have K-Dur 40 mEq twice daily.   - Heart Failure team following, appreciate recs  - Continue Hydralazine 50 mg TID  - Continue Metolazone 5 mg daily - Likely will be transitioned to Torsemide 80 mg PO BID tomorrow  - Continue Milrinone --> wean off today?. Not a home Milrinone candidate  - Lasix gtt discontinued today  - Continue Imdur 30 mg daily  - Keep central line to access CVP and CO-Hb  - Avoid beta blocker due to low output  - Avoid ACE- inhibitor due to acute renal failure  - Continue strict I&Os  - Daily weights   2. Hyponatremia: Patient asymptomatic. Patient's sodium had been steadily decreasing to 125 on 9/1 and he received 1 dose of Tolvaptan 30 mg. Na improved to 130. Na now trending down again, today 127. Hyponatremia likely 2/2 hypervolemia.  - Consider another dose of Tolvaptan if Na continues to decrease  - Repeat bmet tomorrow AM  - Continue to monitor   3. Acute gout flare: Elbow pain has improved. Pt now having severe pain in feet due to gout. Pt received 3 days of Prednisone 40 mg, which was completed on 9/1 for elbow pain. He has been restarted on Prednisone but is refusing to take the medication because of side effect of elevated blood sugars.  - Continue Allopurinol 100 mg daily ' - Continue Prednisone, encourage pt to take medication. Can increase insulin if decides to take Prednisone. - Continue Gabapentin 200 mg TID  4. Possible early diverticulitis?- Resolving: Pt had  recurrent diarrhea on 9/1 and 9/2 with mild abdominal pain. Pt states he has not had any more episodes of diarrhea. He was started on Immodium on 9/2 which seems to have helped. CT Abd showed diverticulosis of colon with mild thickening of sigmoid colon suggestive of early diverticulitis. GI panel neg. C diff neg. Pt has denied recurrent diarrhea for several days now.  - Continue to monitor for recurrent diarrhea  - Continue Zofran 4 mg IV prn  - Continue morphine  Q6H prn severe pain  - Continue Oxycodone 5 mg Q4H prn pain   5. CAD: Continue home atorvastatin 80 mg daily and ASA 81 mg daily.   6. HTN: BP 129/74 today.  - Continue Hydralazine 50 mg TID  - Off diuretics today, will resume PO diuretics tomorrow - Continue Metolazone 5 mg daily  - Likely will transition to Torsemide 80 mg PO BID tomorrow  7. Type 2 DM: Pt had extremely elevated blood sugars last night in 400-500s. Was given additional 25 units Novolog to bring blood sugar down. Patient noncompliant with diet.  - Continue Lantus 20 units QHS  - Continue Novolog 4 units TID with meals, consider increasing especially if starts taking Prednisone - Continue insulin sliding scale   8. GERD:  - Continue famotidine    9. Asthma:  - Continue albuterol 2.5mg  q2h prn for wheezing.   Diet: Carb modified  DVT/PE PPx: Heparin SQ  Dispo: Deferred at this time. Awaiting resolution of medical problems.   The patient does have a current PCP Baltazar Apo, MD) and does need an [facility] hospital follow-up appointment after discharge.  The patient does not have transportation limitations that hinder transportation to clinic appointments.  .Services Needed at time of  discharge: Y = Yes, Blank = No PT:   OT:   RN:   Equipment:   Other:     LOS: 9 days   Rich Number, MD 09/23/2013, 1:51 PM

## 2013-09-23 NOTE — Progress Notes (Signed)
Rechecked pt CBG after 12 units. CBG 248. Pt continues to be aggitated and restless about gout pain. He is unhappy with pain management and became verbally abusive toward nurse tech. Pt demands to speak with MD. Internal Medicine notified and on their way to the unit.

## 2013-09-23 NOTE — Progress Notes (Addendum)
Related admission to Hospice diagnosis of hypertrophic cardiomyopathy.  Patient is a full code.  Reviewed patient's chart and diuretics have been stopped secondary to weight below baseline.  Patient has begun Milrinone wean today.  Patient has been uncomfortable secondary to gout pain and has not tolerated Prednisone with relation to blood sugars.  MD has been made aware and pain regimen will continue with Oxycodone and Morphine as needed.  Spoke to patient's RN and she noted that the patient has decided he no longer wants to receive Hospice services.  This RN to follow up and make primary team aware of request.  No further needs at this time, continue current plan of care.  Encourage a call to Hospice at [phone] with any needs.  April Costella Hatcher, RN, BSN Hospice  Information provided to [facility] RN was relayed via report.  Pt has not directly expressed desire to leave hospice care to me, as was communicated to the Hospice RN.  Encouraged hospice RN to have team follow up with pt tomorrow for clarification of hospice involvement. Reinhardt Licausi, [facility]

## 2013-09-23 NOTE — Progress Notes (Signed)
Advanced Heart Failure Rounding Note   Subjective:    [name] is a 48y/o male with HTN, DM2, polysunbstance abuse with longstanding HF due to NICM (probably ETOH), EF 10%. Current cocaine abuse. Was previously on Hospice Care.   Admitted with ab pain & n/v/diarrhea. Weight up to 231. No response to oral diuretics. + edema, fatigue. Multiple runs NSVT. Cr 2.11->2.84-3.10   Noncontrast CT ab: Diverticulosis of the colon with mild thickening of the sigmoid colon suggesting early diverticulitis. No abscess. Hepatic cirrhosis with mild ascites and edema throughout the subcutaneous soft tissues. Renal function stable.   Weight down to 210. This is below baseline. Renal function slightly worse. Co-ox 84% Continues to c/o of pain and has been abusive toward staff.    Objective:   Weight Range:  Vital Signs:   Temp:  [98.2 F (36.8 C)-98.6 F (37 C)] 98.2 F (36.8 C) (09/06 0737) Pulse Rate:  [107-122] 109 (09/06 0735) Resp:  [17-19] 18 (09/05 2000) BP: (108-129)/(64-78) 129/74 mmHg (09/06 0735) SpO2:  [92 %-96 %] 92 % (09/06 0735) Weight:  [95.6 kg (210 lb 12.2 oz)] 95.6 kg (210 lb 12.2 oz) (09/06 0438) Last BM Date: 09/19/13  Weight change: Filed Weights   09/21/13 0400 09/22/13 0327 09/23/13 0438  Weight: 98.204 kg (216 lb 8 oz) 99.1 kg (218 lb 7.6 oz) 95.6 kg (210 lb 12.2 oz)    Intake/Output:   Intake/Output Summary (Last 24 hours) at 09/23/13 1112 Last data filed at 09/23/13 0700  Gross per 24 hour  Intake  912.6 ml  Output   3250 ml  Net -2337.4 ml     Physical Exam: CVP 8-10 General: Chronically ill appearing. No resp difficulty;  HEENT: normal  Neck: supple. RIJ central line. JVP 9-10. Carotids 2+ bilaterally; no bruits. No lymphadenopathy or thryomegaly appreciated.  Cor: PMI normal. Tachy & regular rhythm. 2/6 systolic murmur LLSB. + S3  Lungs: clear  Abdomen: obese, soft, nontender, non distended. No hepatosplenomegaly. No bruits or masses. Good bowel sounds.   Extremities: no cyanosis, clubbing, rash. No edema; both feet and 1st MTP sore to touch  Neuro: alert & orientedx3, cranial nerves grossly intact. Moves all 4 extremities w/o difficulty. Affect pleasant.when   Telemetry: ST 100-120 with PVCs  Labs: Basic Metabolic Panel:  Recent Labs Lab 09/19/13 0419 09/20/13 0400 09/21/13 0435 09/22/13 0419 09/23/13 0514  NA 130* 130* 129* 127* 127*  K 3.7 3.3* 3.6* 3.5* 3.2*  CL 86* 83* 81* 77* 76*  CO2 29 32 36* 38* 39*  GLUCOSE 219* 228* 251* 279* 137*  BUN 59* 60* 58* 54* 60*  CREATININE 2.10* 1.85* 1.86* 1.90* 1.94*  CALCIUM 9.2 9.0 8.6 9.1 9.5  MG 1.9 1.9  --   --   --     Liver Function Tests: No results found for this basename: AST, ALT, ALKPHOS, BILITOT, PROT, ALBUMIN,  in the last 168 hours No results found for this basename: LIPASE, AMYLASE,  in the last 168 hours No results found for this basename: AMMONIA,  in the last 168 hours  CBC:  Recent Labs Lab 09/16/13 2250  WBC 5.0  HGB 14.8  HCT 43.2  MCV 83.2  PLT 194    Cardiac Enzymes:  Recent Labs Lab 09/20/13 1130 09/20/13 1701  TROPONINI <0.30 <0.30    BNP: BNP (last 3 results)  Recent Labs  07/12/13 1344 09/08/13 2224 09/14/13 1203  PROBNP 5773.0* 6632.0* 10023*     Other results:  EKG:   Imaging:  No results found.   Medications:     Scheduled Medications: . allopurinol  100 mg Oral Daily  . aspirin EC  81 mg Oral Daily  . atorvastatin  80 mg Oral q1800  . famotidine  20 mg Oral Daily  . gabapentin  200 mg Oral TID  . heparin  5,000 Units Subcutaneous 3 times per day  . hydrALAZINE  50 mg Oral TID  . insulin aspart  0-20 Units Subcutaneous TID WC  . insulin aspart  0-5 Units Subcutaneous QHS  . insulin aspart  4 Units Subcutaneous TID WC  . insulin glargine  20 Units Subcutaneous QHS  . isosorbide mononitrate  30 mg Oral Daily  . metolazone  5 mg Oral Q breakfast  . predniSONE  40 mg Oral Q breakfast  . sodium chloride  3 mL  Intravenous Q12H    Infusions: . furosemide (LASIX) infusion 15 mg/hr (09/22/13 2141)  . milrinone 0.25 mcg/kg/min (09/23/13 1039)    PRN Medications: sodium chloride, albuterol, loperamide, LORazepam, morphine injection, ondansetron (ZOFRAN) IV, oxyCODONE, sodium chloride   Assessment:   1. A/c systolic HF with low output physiology  --d/c weight in 6/15 was 206 pounds  2. NICM EF 10-15%  3. A/c renal failure, stage IV (baseline Cr ~2.0-2.5)  4. Hyperkalemia  5. NSVT  6. N/V/diarrhea -  7. H/o polysubstance abuse  8. Noncompliance  9. Hyponatremia/Hypokalemia 10. Acute gout 11. NSVT  Plan/Discussion:    He is dry.  Weight down below baseline. Stop diuretics today. Resume po tomorrow.  Start milrinone wean. Supp K=.   Prednisone restarted yesterday for gout.    He is not candidate for home inotropes.   Salayah Meares,MD 11:12 AM

## 2013-09-23 NOTE — Progress Notes (Signed)
Pt noted to have 22 beats of VT.  Pt asleep at the time.  Awakened without complaints of symptomology.  Pt upset about being woken up frequently.  Informed pt that he is being woken only when necessary and that care is being clustered as possible.  MD notified. No new orders. Will cont to monitor. Estera Ozier, [facility]

## 2013-09-24 LAB — BASIC METABOLIC PANEL
ANION GAP: 12 (ref 5–15)
BUN: 73 mg/dL — ABNORMAL HIGH (ref 6–23)
CHLORIDE: 79 meq/L — AB (ref 96–112)
CO2: 36 meq/L — AB (ref 19–32)
CREATININE: 2.03 mg/dL — AB (ref 0.50–1.35)
Calcium: 8.5 mg/dL (ref 8.4–10.5)
GFR calc non Af Amer: 38 mL/min — ABNORMAL LOW (ref >90)
GFR, EST AFRICAN AMERICAN: 44 mL/min — AB (ref >90)
Glucose, Bld: 201 mg/dL — ABNORMAL HIGH (ref 70–99)
POTASSIUM: 3.8 meq/L (ref 3.7–5.3)
Sodium: 127 mEq/L — ABNORMAL LOW (ref 137–147)

## 2013-09-24 LAB — CARBOXYHEMOGLOBIN
CARBOXYHEMOGLOBIN: 1.8 % — AB (ref 0.5–1.5)
Methemoglobin: 0.7 % (ref 0.0–1.5)
O2 Saturation: 78.5 %
Total hemoglobin: 15.1 g/dL (ref 13.5–18.0)

## 2013-09-24 LAB — GLUCOSE, CAPILLARY
GLUCOSE-CAPILLARY: 150 mg/dL — AB (ref 70–99)
GLUCOSE-CAPILLARY: 365 mg/dL — AB (ref 70–99)
Glucose-Capillary: 352 mg/dL — ABNORMAL HIGH (ref 70–99)
Glucose-Capillary: 402 mg/dL — ABNORMAL HIGH (ref 70–99)

## 2013-09-24 MED ORDER — INSULIN ASPART 100 UNIT/ML ~~LOC~~ SOLN
25.0000 [IU] | Freq: Once | SUBCUTANEOUS | Status: AC
  Administered 2013-09-24: 25 [IU] via SUBCUTANEOUS

## 2013-09-24 MED ORDER — SENNA 8.6 MG PO TABS
1.0000 | ORAL_TABLET | Freq: Every day | ORAL | Status: DC | PRN
  Administered 2013-09-24: 8.6 mg via ORAL
  Filled 2013-09-24 (×2): qty 1

## 2013-09-24 MED ORDER — TORSEMIDE 20 MG PO TABS
60.0000 mg | ORAL_TABLET | Freq: Two times a day (BID) | ORAL | Status: DC
  Administered 2013-09-24 – 2013-09-25 (×2): 60 mg via ORAL
  Filled 2013-09-24 (×4): qty 3

## 2013-09-24 NOTE — Progress Notes (Signed)
Pt complained of 9/10 CP, but asymptomatic, pt could barely open eyes when describing pain to nurse. Cardiology MD notified; EKG complete; No significant findings in EKG; Pt unable to receive requested Morphine PRN until 5am. Pt then sat on the side of bed and pain subsided. Vitals remain stable. Will continue to monitor pt.

## 2013-09-24 NOTE — Progress Notes (Signed)
Patient requesting apple juice to drink. Educated patient about carb modified diet and sugar content of apple juice and that apple juice can continue to cause increases in blood sugars which have already been elevated. Patient states "it won't raise my blood sugar that much." Requests juice despite education. Will continue to monitor.

## 2013-09-24 NOTE — Progress Notes (Signed)
Patient complaining of right sided chest pain 10/10 radiates to right side of back, states pain is intermittent. Denies nausea, diaphoresis, SOB, dizziness. BP 134/69, HR 108, occasional PVCs noted. EKG completed, no evidence of acute distress. Preparing to give Morphine 2mg  per PRN order, see MAR. Will continue to assess and monitor patient closely.

## 2013-09-24 NOTE — Progress Notes (Signed)
Subjective: Overnight, patient had 22 beats of VTach. He then complained of 9/10 CP. EKG was done that showed no significant changes. Pain eventually subsided after patient sat on side of bed. This AM, patient reports he is no longer having chest pain. He denies SOB, palpitations, diaphoresis. He states his feet are hurting him badly and he refuses to take Prednisone because it elevates his blood sugars.   Objective: Vital signs in last 24 hours: Filed Vitals:   09/24/13 0109 09/24/13 0500 09/24/13 0514 09/24/13 0800  BP: 101/68  96/60 101/68  Pulse:   112   Temp:   97.9 F (36.6 C) 98.8 F (37.1 C)  TempSrc:   Oral Oral  Resp:      Height:      Weight:  214 lb 2 oz (97.126 kg)    SpO2:    96%   Weight change: 3 lb 5.8 oz (1.526 kg)  Intake/Output Summary (Last 24 hours) at 09/24/13 1102 Last data filed at 09/24/13 0700  Gross per 24 hour  Intake 1118.55 ml  Output   1850 ml  Net -731.45 ml   Physical Exam General: sitting up in bed, NAD HEENT: [place]/AT, EOMI, mucus membranes moist CV: tachycardic, 2/6 systolic ejection murmur Pulm: CTA bilaterally Msk: 2+ pitting edema in lower extremities bilaterally, pt refuses to have feet examined Neuro: alert and oriented x 3, CNs II-XII intact  Lab Results: Basic Metabolic Panel:  Recent Labs Lab 09/19/13 0419 09/20/13 0400  09/23/13 0514 09/24/13 0508  NA 130* 130*  < > 127* 127*  K 3.7 3.3*  < > 3.2* 3.8  CL 86* 83*  < > 76* 79*  CO2 29 32  < > 39* 36*  GLUCOSE 219* 228*  < > 137* 201*  BUN 59* 60*  < > 60* 73*  CREATININE 2.10* 1.85*  < > 1.94* 2.03*  CALCIUM 9.2 9.0  < > 9.5 8.5  MG 1.9 1.9  --   --   --   < > = values in this interval not displayed. Liver Function Tests: No results found for this basename: AST, ALT, ALKPHOS, BILITOT, PROT, ALBUMIN,  in the last 168 hours No results found for this basename: LIPASE, AMYLASE,  in the last 168 hours No results found for this basename: AMMONIA,  in the last 168  hours CBC: No results found for this basename: WBC, NEUTROABS, HGB, HCT, MCV, PLT,  in the last 168 hours Cardiac Enzymes:  Recent Labs Lab 09/20/13 1130 09/20/13 1701  TROPONINI <0.30 <0.30   BNP: No results found for this basename: PROBNP,  in the last 168 hours D-Dimer: No results found for this basename: DDIMER,  in the last 168 hours CBG:  Recent Labs Lab 09/23/13 0244 09/23/13 0729 09/23/13 1207 09/23/13 1628 09/23/13 2106 09/24/13 0826  GLUCAP 235* 133* 193* 255* 304* 150*   Hemoglobin A1C: No results found for this basename: HGBA1C,  in the last 168 hours Fasting Lipid Panel: No results found for this basename: CHOL, HDL, LDLCALC, TRIG, CHOLHDL, LDLDIRECT,  in the last 168 hours Thyroid Function Tests: No results found for this basename: TSH, T4TOTAL, FREET4, T3FREE, THYROIDAB,  in the last 168 hours Coagulation: No results found for this basename: LABPROT, INR,  in the last 168 hours Anemia Panel: No results found for this basename: VITAMINB12, FOLATE, FERRITIN, TIBC, IRON, RETICCTPCT,  in the last 168 hours Urine Drug Screen: Drugs of Abuse     Component Value Date/Time  LABOPIA NONE DETECTED 09/14/2013 2359   LABOPIA NEG 02/01/2012 1356   COCAINSCRNUR NONE DETECTED 09/14/2013 2359   COCAINSCRNUR PPS 02/01/2012 1356   LABBENZ NONE DETECTED 09/14/2013 2359   LABBENZ PPS 02/01/2012 1356   LABBENZ NEG 07/09/2010 1522   AMPHETMU NONE DETECTED 09/14/2013 2359   AMPHETMU NEG 07/09/2010 1522   THCU NONE DETECTED 09/14/2013 2359   LABBARB NONE DETECTED 09/14/2013 2359   LABBARB NEG 02/01/2012 1356    Alcohol Level: No results found for this basename: ETH,  in the last 168 hours Urinalysis: No results found for this basename: COLORURINE, APPERANCEUR, LABSPEC, PHURINE, GLUCOSEU, HGBUR, BILIRUBINUR, KETONESUR, PROTEINUR, UROBILINOGEN, NITRITE, LEUKOCYTESUR,  in the last 168 hours   Micro Results: Recent Results (from the past 240 hour(s))  MRSA PCR SCREENING      Status: Abnormal   Collection Time    09/15/13 11:01 AM      Result Value Ref Range Status   MRSA by PCR POSITIVE (*) NEGATIVE Final   Comment:            The GeneXpert MRSA Assay (FDA     approved for NASAL specimens     only), is one component of a     comprehensive MRSA colonization     surveillance program. It is not     intended to diagnose MRSA     infection nor to guide or     monitor treatment for     MRSA infections.     RESULT CALLED TO, READ BACK BY AND VERIFIED WITH:     BRODY J.,RN 09/15/13 1455 BY JONESJ  CLOSTRIDIUM DIFFICILE BY PCR     Status: None   Collection Time    09/15/13 11:15 AM      Result Value Ref Range Status   C difficile by pcr NEGATIVE  NEGATIVE Final   Studies/Results: No results found. Medications: I have reviewed the patient's current medications. Scheduled Meds: . allopurinol  100 mg Oral Daily  . aspirin EC  81 mg Oral Daily  . atorvastatin  80 mg Oral q1800  . famotidine  20 mg Oral Daily  . gabapentin  200 mg Oral TID  . heparin  5,000 Units Subcutaneous 3 times per day  . hydrALAZINE  50 mg Oral TID  . insulin aspart  0-20 Units Subcutaneous TID WC  . insulin aspart  0-5 Units Subcutaneous QHS  . insulin aspart  4 Units Subcutaneous TID WC  . insulin glargine  20 Units Subcutaneous QHS  . isosorbide mononitrate  30 mg Oral Daily  . metolazone  5 mg Oral Q breakfast  . predniSONE  40 mg Oral Q breakfast  . sodium chloride  3 mL Intravenous Q12H   Continuous Infusions: . milrinone 0.125 mcg/kg/min (09/24/13 0841)   PRN Meds:.sodium chloride, albuterol, loperamide, LORazepam, morphine injection, ondansetron (ZOFRAN) IV, oxyCODONE, sodium chloride Assessment/Plan: Mr. Plass is a 48yo man w/ PMHx of poorly controlled Type 2 DM, CKD Stage 3, chronic systolic non-ischemic cardiomyopathy with AICD (EF 10%) 2/2 polysubstance abuse (alcohol, cocaine) who was admitted for abdominal pain, nausea, vomiting, and diarrhea and cardiorenal  syndrome.   1. Cardiorenal syndrome: CHF complicated by CKD. Weight is 214 lb today, up 4 lbs from yesterday. Wt has been trending down overall. His peak weight was 241 lbs. Baseline weight appears to be ~205 lbs. He has diuresed 2.3 L in last 24 hours, -21.8 L since admission. Cr 2.03 today, Cr has been trending back up. Potassium 3.8 today.  Pt scheduled to have K-Dur 40 mEq twice daily.  - Heart Failure team following, appreciate recs  - Continue Hydralazine 50 mg TID  - Continue Metolazone 5 mg daily  - Wean off Milrinone today? Not a home Milrinone candidate  - Lasix gtt discontinued yesterday, likely will switch to PO diuretics today  - Continue Imdur 30 mg daily  - Keep central line to access CVP and CO-Hb  - Avoid beta blocker due to low output  - Avoid ACE- inhibitor due to acute renal failure  - Continue strict I&Os  - Daily weights   2. Hyponatremia: Patient asymptomatic. Patient's sodium had been steadily decreasing to 125 on 9/1 and he received 1 dose of Tolvaptan 30 mg. Na improved to 130. Na now trending down again, has been stable at 127 for last 3 days. Hyponatremia likely 2/2 hypervolemia.  - Consider another dose of Tolvaptan if Na continues to decrease  - Repeat bmet tomorrow AM  - Continue to monitor   3. Acute gout flare: Elbow pain has improved. Pt now having severe pain in feet due to gout. Pt received 3 days of Prednisone 40 mg, which was completed on 9/1 for elbow pain. He has been restarted on Prednisone but is refusing to take the medication because of side effect of elevated blood sugars. Will not give Colchicine given worsening creatinine.  - Continue Allopurinol 100 mg daily '  - Continue Prednisone, encourage pt to take medication. Can increase insulin if decides to take Prednisone.  - Continue Gabapentin 200 mg TID   4. Possible early diverticulitis?- Resolved: Pt had recurrent diarrhea on 9/1 and 9/2 with mild abdominal pain. Pt states he has not had any more  episodes of diarrhea. He was started on Immodium on 9/2 which seems to have helped. CT Abd showed diverticulosis of colon with mild thickening of sigmoid colon suggestive of early diverticulitis. GI panel neg. C diff neg. Pt has denied recurrent diarrhea for several days now.  - Continue to monitor for recurrent diarrhea  - Continue Zofran 4 mg IV prn  - Continue morphine  Q6H prn severe pain  - Continue Oxycodone 5 mg Q4H prn pain   5. CAD: Continue home atorvastatin 80 mg daily and ASA 81 mg daily.   6. HTN: BP 101/68 today.  - Continue Hydralazine 50 mg TID  - Off diuretics today, will resume PO diuretics today? - Continue Metolazone 5 mg daily   7. Type 2 DM: Pt had extremely elevated blood sugars last night in 130-300s. Patient noncompliant with diet.  - Continue Lantus 20 units QHS  - Continue Novolog 4 units TID with meals, consider increasing especially if starts taking Prednisone  - Continue insulin sliding scale   8. GERD:  - Continue famotidine    9. Asthma:  - Continue albuterol 2.5mg  q2h prn for wheezing.   Diet: Carb modified  DVT/PE PPx: Heparin SQ  Dispo: Deferred at this time. Awaiting resolution of medical problems.   The patient does have a current PCP Baltazar Apo, MD) and does need an [facility] hospital follow-up appointment after discharge.  The patient does not have transportation limitations that hinder transportation to clinic appointments.  .Services Needed at time of discharge: Y = Yes, Blank = No PT:   OT:   RN:   Equipment:   Other:     LOS: 10 days   Rich Number, MD 09/24/2013, 11:02 AM

## 2013-09-24 NOTE — Progress Notes (Signed)
Patient was resting comfortably in bed. His lights were out and no family was present at the bedside. Patient was aroused to verbal prompts. He reported having intermittent pain, but feels it is being controlled with current pain regiment in place. He verbalized no other needs/concerns. There are no plans for discharge today. Primary LCSW, Althia Forts will be updated for follow-up. This is a related admission, please call [facility] ([phone]) for any questions or change in patient's condition.   Clydia Llano, MSW, LCSW [phone]

## 2013-09-24 NOTE — Progress Notes (Addendum)
Advanced Heart Failure Rounding Note   Subjective:    [name] is a 48y/o male with HTN, DM2, polysunbstance abuse with longstanding HF due to NICM (probably ETOH), EF 10%. Current cocaine abuse. Was previously on [facility].   Admitted with ab pain & n/v/diarrhea. Weight up to 231. No response to oral diuretics. + edema, fatigue. Multiple runs NSVT. Cr 2.11->2.84-3.10   Noncontrast CT ab: Diverticulosis of the colon with mild thickening of the sigmoid colon suggesting early diverticulitis. No abscess. Hepatic cirrhosis with mild ascites and edema throughout the subcutaneous soft tissues. Renal function stable.   Still c/o foot pain. Refusing prednisone. Milrinone cut to 0.Marland Kitchen125 yesterday. Diuretics held. Weight 210-> 214 today.  Co-ox remains 78%. Cr slightly worse 1.9->2.0.    Objective:   Weight Range:  Vital Signs:   Temp:  [97.9 F (36.6 C)-98.8 F (37.1 C)] 98.8 F (37.1 C) (09/07 0800) Pulse Rate:  [111-112] 112 (09/07 0514) Resp:  [18-19] 18 (09/07 0800) BP: (90-115)/(60-78) 101/68 mmHg (09/07 0800) SpO2:  [96 %-99 %] 96 % (09/07 0800) Weight:  [97.126 kg (214 lb 2 oz)] 97.126 kg (214 lb 2 oz) (09/07 0500) Last BM Date: 09/19/13  Weight change: Filed Weights   09/22/13 0327 09/23/13 0438 09/24/13 0500  Weight: 99.1 kg (218 lb 7.6 oz) 95.6 kg (210 lb 12.2 oz) 97.126 kg (214 lb 2 oz)    Intake/Output:   Intake/Output Summary (Last 24 hours) at 09/24/13 1223 Last data filed at 09/24/13 1100  Gross per 24 hour  Intake 1616.35 ml  Output   2250 ml  Net -633.65 ml     Physical Exam: CVP 10 General: Chronically ill appearing. No resp difficulty;  HEENT: normal  Neck: supple. RIJ central line. JVP 9-10. Carotids 2+ bilaterally; no bruits. No lymphadenopathy or thryomegaly appreciated.  Cor: PMI normal. Tachy & regular rhythm. 2/6 systolic murmur LLSB. + S3  Lungs: clear  Abdomen: obese, soft, nontender, non distended. No hepatosplenomegaly. No bruits or masses.  Good bowel sounds.  Extremities: no cyanosis, clubbing, rash. No edema; both feet and 1st MTP sore to touch  Neuro: alert & orientedx3, cranial nerves grossly intact. Moves all 4 extremities w/o difficulty. Affect pleasant.when   Telemetry: ST 100-120 with PVCs  Labs: Basic Metabolic Panel:  Recent Labs Lab 09/19/13 0419 09/20/13 0400 09/21/13 0435 09/22/13 0419 09/23/13 0514 09/24/13 0508  NA 130* 130* 129* 127* 127* 127*  K 3.7 3.3* 3.6* 3.5* 3.2* 3.8  CL 86* 83* 81* 77* 76* 79*  CO2 29 32 36* 38* 39* 36*  GLUCOSE 219* 228* 251* 279* 137* 201*  BUN 59* 60* 58* 54* 60* 73*  CREATININE 2.10* 1.85* 1.86* 1.90* 1.94* 2.03*  CALCIUM 9.2 9.0 8.6 9.1 9.5 8.5  MG 1.9 1.9  --   --   --   --     Liver Function Tests: No results found for this basename: AST, ALT, ALKPHOS, BILITOT, PROT, ALBUMIN,  in the last 168 hours No results found for this basename: LIPASE, AMYLASE,  in the last 168 hours No results found for this basename: AMMONIA,  in the last 168 hours  CBC: No results found for this basename: WBC, NEUTROABS, HGB, HCT, MCV, PLT,  in the last 168 hours  Cardiac Enzymes:  Recent Labs Lab 09/20/13 1130 09/20/13 1701  TROPONINI <0.30 <0.30    BNP: BNP (last 3 results)  Recent Labs  07/12/13 1344 09/08/13 2224 09/14/13 1203  PROBNP 5773.0* 6632.0* 10023*  Other results:  EKG:   Imaging: No results found.   Medications:     Scheduled Medications: . allopurinol  100 mg Oral Daily  . aspirin EC  81 mg Oral Daily  . atorvastatin  80 mg Oral q1800  . famotidine  20 mg Oral Daily  . gabapentin  200 mg Oral TID  . heparin  5,000 Units Subcutaneous 3 times per day  . hydrALAZINE  50 mg Oral TID  . insulin aspart  0-20 Units Subcutaneous TID WC  . insulin aspart  0-5 Units Subcutaneous QHS  . insulin aspart  4 Units Subcutaneous TID WC  . insulin glargine  20 Units Subcutaneous QHS  . isosorbide mononitrate  30 mg Oral Daily  . metolazone  5 mg Oral  Q breakfast  . predniSONE  40 mg Oral Q breakfast  . sodium chloride  3 mL Intravenous Q12H    Infusions: . milrinone 0.125 mcg/kg/min (09/24/13 0841)    PRN Medications: sodium chloride, albuterol, loperamide, LORazepam, morphine injection, ondansetron (ZOFRAN) IV, oxyCODONE, sodium chloride   Assessment:   1. A/c systolic HF with low output physiology  --d/c weight in 6/15 was 206 pounds  2. NICM EF 10-15%  3. A/c renal failure, stage IV (baseline Cr ~2.0-2.5)  4. Hyperkalemia  5. NSVT  6. N/V/diarrhea -  7. H/o polysubstance abuse  8. Noncompliance  9. Hyponatremia/Hypokalemia 10. Acute gout 11. NSVT  Plan/Discussion:    Weight at baseline. CVP 10. Resume po torsemide. Continue milrinone at 0.125 today. If weight stable overnight will wean milrinone to off tomorrow.  Watch renal function.    He is not candidate for home inotropes.   Jonathan Boom Delonta Yohannes,MD 12:23 PM

## 2013-09-25 LAB — BASIC METABOLIC PANEL
Anion gap: 13 (ref 5–15)
BUN: 78 mg/dL — AB (ref 6–23)
CO2: 35 meq/L — AB (ref 19–32)
CREATININE: 1.98 mg/dL — AB (ref 0.50–1.35)
Calcium: 9 mg/dL (ref 8.4–10.5)
Chloride: 78 mEq/L — ABNORMAL LOW (ref 96–112)
GFR calc Af Amer: 45 mL/min — ABNORMAL LOW (ref >90)
GFR calc non Af Amer: 39 mL/min — ABNORMAL LOW (ref >90)
Glucose, Bld: 171 mg/dL — ABNORMAL HIGH (ref 70–99)
Potassium: 3.8 mEq/L (ref 3.7–5.3)
Sodium: 126 mEq/L — ABNORMAL LOW (ref 137–147)

## 2013-09-25 LAB — CARBOXYHEMOGLOBIN
Carboxyhemoglobin: 1.8 % — ABNORMAL HIGH (ref 0.5–1.5)
METHEMOGLOBIN: 0.5 % (ref 0.0–1.5)
O2 Saturation: 76.9 %
TOTAL HEMOGLOBIN: 14.8 g/dL (ref 13.5–18.0)

## 2013-09-25 LAB — GLUCOSE, CAPILLARY
GLUCOSE-CAPILLARY: 175 mg/dL — AB (ref 70–99)
GLUCOSE-CAPILLARY: 267 mg/dL — AB (ref 70–99)
Glucose-Capillary: 190 mg/dL — ABNORMAL HIGH (ref 70–99)
Glucose-Capillary: 132 mg/dL — ABNORMAL HIGH (ref 70–99)

## 2013-09-25 MED ORDER — FUROSEMIDE 10 MG/ML IJ SOLN
80.0000 mg | Freq: Three times a day (TID) | INTRAMUSCULAR | Status: DC
  Administered 2013-09-25 – 2013-09-27 (×8): 80 mg via INTRAVENOUS
  Filled 2013-09-25 (×11): qty 8

## 2013-09-25 MED ORDER — POTASSIUM CHLORIDE CRYS ER 20 MEQ PO TBCR
20.0000 meq | EXTENDED_RELEASE_TABLET | Freq: Once | ORAL | Status: AC
  Administered 2013-09-25: 20 meq via ORAL
  Filled 2013-09-25: qty 1

## 2013-09-25 NOTE — Progress Notes (Signed)
Pt seen and examined. Please refer to resident note for details Denied CP to me, no fevers. Answers questions in yes/no fashion. Wants to go back to sleep.   Exam: Gen- AAO*3 Cardio- tachycardic, SEM + Lungs- CTA b/l Ext- improving LE edema Abd- soft, non tender, BS +   Assessment and Plan: 48 y/o man with cardiorenal syndrome now improving on milrinone and diuretics  Cardiorenal syndrome: - Cardio following - c/w milrinone gtt for now. Not a candidate for home milrinone secondary to h/o drug abuse and non compliance - c/w hydralazine, imfur - Now off lasix gtt. C/w IV lasix for now, c/w metolazone  Hyponatremia: - Na stable. S/p 1 dose of tolvaptan. Will monitor - Likely hypervolemic hyponatremia. Will consider another dose of tolvaptan if worsening Na  DM: - 1 episode of BS 402 secondary to pt snacking. Would c/w current regomen and monitor

## 2013-09-25 NOTE — Progress Notes (Signed)
CARDIAC REHAB PHASE I   PRE:  Rate/Rhythm: 110 ST with PVCs    BP: sitting 105/56    SaO2: 93 RA  MODE:  Ambulation: 340 ft   POST:  Rate/Rhythm: 113 ST with PVCs    BP: sitting 103/72     SaO2: 95 RA  RN was able to wake pt. I then waited on him an hr to eat. Pt was eating when I returned but willing to walk, just with many c/os. Steady pushing his IV pole. No major c/o, feels well. Will f/u tomorrow, pt sts he likes to walk after breakfast. Sts he has been trying to walk with someone for days. [phone]   Elissa Lovett [place] CES, ACSM 09/25/2013 3:29 PM

## 2013-09-25 NOTE — Progress Notes (Addendum)
Inpatient RN visit- [name] [facility] 2H Room 23 -[facility] RN Visit-[name] RN  Related admission to [facility] diagnosis of Hypertrophic Cardiomyopathy. Pt is Full Code.   Pt seen at bedside, appeared to be asleep, did not wake up to interact with Clinical research associate. He has become more withdrawn over the past week that writer has been visiting.Per staff RN Aggie Cosier pt had PRN oxycodone 5 mg prior to visit for all over pain. Per chart rev and Aggie Cosier pt had an episode of CP last evening, his CVP this am was 20 and he has gained 7 lbs since yesterday. He remains on the milrinone gtt, lasix gtt stopped on 9/6. Pt now recvg scheduled IV lasix tid and po metolazone 5 mg daily for management of volume overload.  [facility] will continue to follow through discharge. Patient's home medication list and transfer summary in place  on shadow chart.   Please call [facility] @ [phone]-with any hospice needs.   Thank you. Jonathan Starling, RN  [facility]  Hospice Liaison  [phone])

## 2013-09-25 NOTE — Progress Notes (Signed)
Subjective: Patient reports he had some chest pain this morning that has now resolved. He states the pain in his feet is getting better. Seems much more depressed today than angry. Weight is up 7 lbs today.  Objective: Vital signs in last 24 hours: Filed Vitals:   09/25/13 0535 09/25/13 0600 09/25/13 0700 09/25/13 0800  BP:   115/76 115/76  Pulse: 104 105 102   Temp:   97.5 F (36.4 C)   TempSrc:   Oral   Resp:      Height:      Weight:      SpO2:   98%    Weight change: 6 lb 15.9 oz (3.173 kg)  Intake/Output Summary (Last 24 hours) at 09/25/13 0952 Last data filed at 09/25/13 0800  Gross per 24 hour  Intake 1701.6 ml  Output   1850 ml  Net -148.4 ml   Physical Exam General: sitting up in bed, eating breakfast, NAD  HEENT: [place]/AT, EOMI, mucus membranes moist  CV: tachycardic, 2/6 systolic ejection murmur  Pulm: CTA bilaterally  Msk: 2+ pitting edema in lower extremities bilaterally Neuro: alert and oriented x 3, CNs II-XII intact     Lab Results: Basic Metabolic Panel:  Recent Labs Lab 09/19/13 0419 09/20/13 0400  09/24/13 0508 09/25/13 0400  NA 130* 130*  < > 127* 126*  K 3.7 3.3*  < > 3.8 3.8  CL 86* 83*  < > 79* 78*  CO2 29 32  < > 36* 35*  GLUCOSE 219* 228*  < > 201* 171*  BUN 59* 60*  < > 73* 78*  CREATININE 2.10* 1.85*  < > 2.03* 1.98*  CALCIUM 9.2 9.0  < > 8.5 9.0  MG 1.9 1.9  --   --   --   < > = values in this interval not displayed. Liver Function Tests: No results found for this basename: AST, ALT, ALKPHOS, BILITOT, PROT, ALBUMIN,  in the last 168 hours No results found for this basename: LIPASE, AMYLASE,  in the last 168 hours No results found for this basename: AMMONIA,  in the last 168 hours CBC: No results found for this basename: WBC, NEUTROABS, HGB, HCT, MCV, PLT,  in the last 168 hours Cardiac Enzymes:  Recent Labs Lab 09/20/13 1130 09/20/13 1701  TROPONINI <0.30 <0.30   BNP: No results found for this basename: PROBNP,  in the  last 168 hours D-Dimer: No results found for this basename: DDIMER,  in the last 168 hours CBG:  Recent Labs Lab 09/23/13 2106 09/24/13 0826 09/24/13 1224 09/24/13 1639 09/24/13 2119 09/25/13 0801  GLUCAP 304* 150* 365* 352* 402* 175*   Hemoglobin A1C: No results found for this basename: HGBA1C,  in the last 168 hours Fasting Lipid Panel: No results found for this basename: CHOL, HDL, LDLCALC, TRIG, CHOLHDL, LDLDIRECT,  in the last 168 hours Thyroid Function Tests: No results found for this basename: TSH, T4TOTAL, FREET4, T3FREE, THYROIDAB,  in the last 168 hours Coagulation: No results found for this basename: LABPROT, INR,  in the last 168 hours Anemia Panel: No results found for this basename: VITAMINB12, FOLATE, FERRITIN, TIBC, IRON, RETICCTPCT,  in the last 168 hours Urine Drug Screen: Drugs of Abuse     Component Value Date/Time   LABOPIA NONE DETECTED 09/14/2013 2359   LABOPIA NEG 02/01/2012 1356   COCAINSCRNUR NONE DETECTED 09/14/2013 2359   COCAINSCRNUR PPS 02/01/2012 1356   LABBENZ NONE DETECTED 09/14/2013 2359   LABBENZ PPS 02/01/2012 1356  LABBENZ NEG 07/09/2010 1522   AMPHETMU NONE DETECTED 09/14/2013 2359   AMPHETMU NEG 07/09/2010 1522   THCU NONE DETECTED 09/14/2013 2359   LABBARB NONE DETECTED 09/14/2013 2359   LABBARB NEG 02/01/2012 1356    Alcohol Level: No results found for this basename: ETH,  in the last 168 hours Urinalysis: No results found for this basename: COLORURINE, APPERANCEUR, LABSPEC, PHURINE, GLUCOSEU, HGBUR, BILIRUBINUR, KETONESUR, PROTEINUR, UROBILINOGEN, NITRITE, LEUKOCYTESUR,  in the last 168 hours   Micro Results: Recent Results (from the past 240 hour(s))  MRSA PCR SCREENING     Status: Abnormal   Collection Time    09/15/13 11:01 AM      Result Value Ref Range Status   MRSA by PCR POSITIVE (*) NEGATIVE Final   Comment:            The GeneXpert MRSA Assay (FDA     approved for NASAL specimens     only), is one component of a      comprehensive MRSA colonization     surveillance program. It is not     intended to diagnose MRSA     infection nor to guide or     monitor treatment for     MRSA infections.     RESULT CALLED TO, READ BACK BY AND VERIFIED WITH:     BRODY J.,RN 09/15/13 1455 BY JONESJ  CLOSTRIDIUM DIFFICILE BY PCR     Status: None   Collection Time    09/15/13 11:15 AM      Result Value Ref Range Status   C difficile by pcr NEGATIVE  NEGATIVE Final   Studies/Results: No results found. Medications: I have reviewed the patient's current medications. Scheduled Meds: . allopurinol  100 mg Oral Daily  . aspirin EC  81 mg Oral Daily  . atorvastatin  80 mg Oral q1800  . famotidine  20 mg Oral Daily  . furosemide  80 mg Intravenous TID  . gabapentin  200 mg Oral TID  . heparin  5,000 Units Subcutaneous 3 times per day  . hydrALAZINE  50 mg Oral TID  . insulin aspart  0-20 Units Subcutaneous TID WC  . insulin aspart  0-5 Units Subcutaneous QHS  . insulin aspart  4 Units Subcutaneous TID WC  . insulin glargine  20 Units Subcutaneous QHS  . isosorbide mononitrate  30 mg Oral Daily  . metolazone  5 mg Oral Q breakfast  . potassium chloride  20 mEq Oral Once  . sodium chloride  3 mL Intravenous Q12H   Continuous Infusions: . milrinone 0.125 mcg/kg/min (09/25/13 0618)   PRN Meds:.sodium chloride, albuterol, loperamide, LORazepam, morphine injection, ondansetron (ZOFRAN) IV, oxyCODONE, senna, sodium chloride Assessment/Plan: Mr. Raben is a 48yo man w/ PMHx of poorly controlled Type 2 DM, CKD Stage 3, chronic systolic non-ischemic cardiomyopathy with AICD (EF 10%) 2/2 polysubstance abuse (alcohol, cocaine) who was admitted for abdominal pain, nausea, vomiting, and diarrhea and cardiorenal syndrome.   1. Cardiorenal syndrome: CHF complicated by CKD. Weight is 221 lb today, up 7 lbs from yesterday. Patient has not been compliant with his diet, not watching fluid intake. His peak weight was 241 lbs. Baseline  weight appears to be ~205 lbs. He has diuresed 2.25 L in last 24 hours, -22.1 L since admission. Cr 1.98 today, similar to yesterday at 2.03. Cr has been stable around 2. Potassium 3.8 today. Pt scheduled to have K-Dur 40 mEq twice daily. Emphasized importance of restricting fluid intake and avoiding salt.  -  Heart Failure team following, appreciate recs  - Continue Hydralazine 50 mg TID  - Continue Metolazone 5 mg daily  - Milrinone decreased to 0,125 mcg today. Not a home Milrinone candidate due to noncompliance and drug abuse (cocaine)  - Lasix 80 mg IV TID started today  - Continue Imdur 30 mg daily  - Keep central line to access CVP and CO-Hb  - Avoid beta blocker due to low output  - Avoid ACE- inhibitor due to acute renal failure  - Continue strict I&Os  - Daily weights   2. Hyponatremia: Patient asymptomatic. Patient's sodium was 125 on 9/1 and he received 1 dose of Tolvaptan 30 mg. Na improved to 130. Na now trending down again, has been stable at 127 for last 3 days, now 126 this AM. Hyponatremia likely 2/2 hypervolemia.  - Consider another dose of Tolvaptan if Na continues to decrease  - Repeat bmet tomorrow AM  - Continue to monitor   3. Acute gout flare: Elbow pain has improved. Pt now having severe pain in feet due to gout. Pt received 3 days of Prednisone 40 mg, which was completed on 9/1 for elbow pain. He has been restarted on Prednisone but is refusing to take the medication because of side effect of elevated blood sugars. Will not give Colchicine given worsening creatinine. Patient states the pain has slightly improved today. Will continue to monitor.  - Continue Allopurinol 100 mg daily '  - Continue Prednisone, encourage pt to take medication. Can increase insulin if decides to take Prednisone.  - Continue Gabapentin 200 mg TID   4. CAD: Continue home atorvastatin 80 mg daily and ASA 81 mg daily.   5. HTN: BP 115/76 today.  - Continue Hydralazine 50 mg TID  - Lasix 80  mg IV TID started today  - Continue Metolazone 5 mg daily   6. Type 2 DM: Pt had extremely elevated blood sugars last night in 170s-400s. Patient noncompliant with diet.  - Continue Lantus 20 units QHS  - Continue Novolog 4 units TID with meals, consider increasing especially if starts taking Prednisone  - Continue insulin sliding scale   7. GERD:  - Continue famotidine 20mg    8. Asthma:  - Continue albuterol 2.5mg  q2h prn for wheezing.   Diet: Carb modified  DVT/PE PPx: Heparin SQ  Dispo: Deferred at this time. Awaiting resolution of medical problems  The patient does have a current PCP (Baltazar Apo, MD) and does need an [facility] hospital follow-up appointment after discharge.  The patient does not have transportation limitations that hinder transportation to clinic appointments.  .Services Needed at time of discharge: Y = Yes, Blank = No PT:   OT:   RN:   Equipment:   Other:     LOS: 11 days   Rich Number, MD 09/25/2013, 9:52 AM

## 2013-09-25 NOTE — Progress Notes (Signed)
Inpatient Diabetes Program Recommendations  AACE/ADA: New Consensus Statement on Inpatient Glycemic Control (2013)  Target Ranges:  Prepandial:   less than 140 mg/dL      Peak postprandial:   less than 180 mg/dL (1-2 hours)      Critically ill patients:  140 - 180 mg/dL  Results for [name], [name] (MRN [id number]) as of 09/25/2013 07:49  Ref. Range 09/23/2013 21:06 09/24/2013 08:26 09/24/2013 12:24 09/24/2013 16:39 09/24/2013 21:19  Glucose-Capillary Latest Range: 70-99 mg/dL 416 (H) 384 (H) 536 (H) 352 (H) 402 (H)   Inpatient Diabetes Program Recommendations Insulin - Basal: consider increasing Lantus to 25 units  Insulin - Meal Coverage: increase Novolog with meals to 6 units TID  Thank you  Piedad Climes BSN, RN,CDE Inpatient Diabetes Coordinator [phone] (team pager)

## 2013-09-25 NOTE — Progress Notes (Signed)
Advanced Heart Failure Rounding Note   Subjective:    [name] is a 48y/o male with HTN, DM2, polysunbstance abuse with longstanding HF due to NICM (probably ETOH), EF 10%. Current cocaine abuse. Was previously on [facility].   Admitted with ab pain & n/v/diarrhea. Weight up to 231. No response to oral diuretics. + edema, fatigue. Multiple runs NSVT. Cr 2.11->2.84-3.10   Noncontrast CT ab: Diverticulosis of the colon with mild thickening of the sigmoid colon suggesting early diverticulitis. No abscess. Hepatic cirrhosis with mild ascites and edema throughout the subcutaneous soft tissues. Renal function stable.   Foot pain improved. Drinking excessive amounts of fluid. Milrinone remains at 0.125 yesterday. Weight 210-> 214>221 today.  Co-ox remains 76%. Cr slightly worse 1.9->2.0>1.98   Denies SOB.    Objective:   Weight Range:  Vital Signs:   Temp:  [96.6 F (35.9 C)-98.7 F (37.1 C)] 97.5 F (36.4 C) (09/08 0700) Pulse Rate:  [100-113] 102 (09/08 0700) Resp:  [16-20] 18 (09/08 0400) BP: (101-134)/(53-76) 115/76 mmHg (09/08 0700) SpO2:  [94 %-98 %] 98 % (09/08 0700) Weight:  [221 lb 1.9 oz (100.3 kg)] 221 lb 1.9 oz (100.3 kg) (09/08 0400) Last BM Date: 09/24/13  Weight change: Filed Weights   09/23/13 0438 09/24/13 0500 09/25/13 0400  Weight: 210 lb 12.2 oz (95.6 kg) 214 lb 2 oz (97.126 kg) 221 lb 1.9 oz (100.3 kg)    Intake/Output:   Intake/Output Summary (Last 24 hours) at 09/25/13 0809 Last data filed at 09/25/13 0700  Gross per 24 hour  Intake 1930.7 ml  Output   2250 ml  Net -319.3 ml     Physical Exam: CVP 20  General: Chronically ill appearing. No resp difficulty; Sitting on side of bed.  HEENT: normal  Neck: supple. RIJ central line. JVP to jaw. Carotids 2+ bilaterally; no bruits. No lymphadenopathy or thryomegaly appreciated.  Cor: PMI normal. Tachy & regular rhythm. 2/6 systolic murmur LLSB. + S3  Lungs: clear  Abdomen: obese, soft, nontender, non  distended. No hepatosplenomegaly. No bruits or masses. Good bowel sounds.  Extremities: no cyanosis, clubbing, rash. No edema; both feet and 1st MTP sore to touch  Neuro: alert & orientedx3, cranial nerves grossly intact. Moves all 4 extremities w/o difficulty. Affect pleasant.when   Telemetry: ST 100-120 with PVCs  Labs: Basic Metabolic Panel:  Recent Labs Lab 09/19/13 0419 09/20/13 0400 09/21/13 0435 09/22/13 0419 09/23/13 0514 09/24/13 0508 09/25/13 0400  NA 130* 130* 129* 127* 127* 127* 126*  K 3.7 3.3* 3.6* 3.5* 3.2* 3.8 3.8  CL 86* 83* 81* 77* 76* 79* 78*  CO2 29 32 36* 38* 39* 36* 35*  GLUCOSE 219* 228* 251* 279* 137* 201* 171*  BUN 59* 60* 58* 54* 60* 73* 78*  CREATININE 2.10* 1.85* 1.86* 1.90* 1.94* 2.03* 1.98*  CALCIUM 9.2 9.0 8.6 9.1 9.5 8.5 9.0  MG 1.9 1.9  --   --   --   --   --     Liver Function Tests: No results found for this basename: AST, ALT, ALKPHOS, BILITOT, PROT, ALBUMIN,  in the last 168 hours No results found for this basename: LIPASE, AMYLASE,  in the last 168 hours No results found for this basename: AMMONIA,  in the last 168 hours  CBC: No results found for this basename: WBC, NEUTROABS, HGB, HCT, MCV, PLT,  in the last 168 hours  Cardiac Enzymes:  Recent Labs Lab 09/20/13 1130 09/20/13 1701  TROPONINI <0.30 <0.30  BNP: BNP (last 3 results)  Recent Labs  07/12/13 1344 09/08/13 2224 09/14/13 1203  PROBNP 5773.0* 6632.0* 10023*     Other results:  EKG:   Imaging: No results found.   Medications:     Scheduled Medications: . allopurinol  100 mg Oral Daily  . aspirin EC  81 mg Oral Daily  . atorvastatin  80 mg Oral q1800  . famotidine  20 mg Oral Daily  . gabapentin  200 mg Oral TID  . heparin  5,000 Units Subcutaneous 3 times per day  . hydrALAZINE  50 mg Oral TID  . insulin aspart  0-20 Units Subcutaneous TID WC  . insulin aspart  0-5 Units Subcutaneous QHS  . insulin aspart  4 Units Subcutaneous TID WC  .  insulin glargine  20 Units Subcutaneous QHS  . isosorbide mononitrate  30 mg Oral Daily  . metolazone  5 mg Oral Q breakfast  . sodium chloride  3 mL Intravenous Q12H  . torsemide  60 mg Oral BID    Infusions: . milrinone 0.125 mcg/kg/min (09/25/13 0618)    PRN Medications: sodium chloride, albuterol, loperamide, LORazepam, morphine injection, ondansetron (ZOFRAN) IV, oxyCODONE, senna, sodium chloride   Assessment:   1. A/c systolic HF with low output physiology  --d/c weight in 6/15 was 206 pounds  2. NICM EF 10-15%  3. A/c renal failure, stage IV (baseline Cr ~2.0-2.5)  4. Hyperkalemia  5. NSVT  6. N/V/diarrhea -  7. H/o polysubstance abuse  8. Noncompliance  9. Hyponatremia/Hypokalemia 10. Acute gout 11. NSVT  Plan/Discussion:   Volume back up which is most likely to due to diet and fluid noncompliance. CVP 20. Weight up 7 pounds. Stop torsemide and change to lasix 80 mg tid. Continue milrinone at 0.125 mcg.   He is not ace due to CKD. No BB due to low output.Not a candidate for home milrinone due to noncompliance and drug abuse.   I have stressed that he need to limit fluids to < 2 liters per day and eliminate salt.   Consult pastoral care.  CLEGG,AMY, NP-C  8:09 AM  Patient seen and examined with Tonye Becket, NP. We discussed all aspects of the encounter. I agree with the assessment and plan as stated above.   Volume status back up in setting of dietary noncompliance. Co-ox ok despite cutting back milrinone. Agree with  resuming IV lasix. Continue metolazone. Hopefully we can get milrinone off tomorrow and get him home soon.  Daniel Bensimhon,MD 10:20 AM

## 2013-09-25 NOTE — Progress Notes (Signed)
Have visited pt twice today. He has been asleep both times. Second visit pt jerks to stimuli, tries to open eyes but can't keep them open. Did not respond with any words. Notified RN. BP 105/56. Ethelda Chick CES, ACSM 2:00 PM 09/25/2013

## 2013-09-26 DIAGNOSIS — I129 Hypertensive chronic kidney disease with stage 1 through stage 4 chronic kidney disease, or unspecified chronic kidney disease: Secondary | ICD-10-CM

## 2013-09-26 LAB — BASIC METABOLIC PANEL
Anion gap: 12 (ref 5–15)
BUN: 79 mg/dL — ABNORMAL HIGH (ref 6–23)
CALCIUM: 9 mg/dL (ref 8.4–10.5)
CO2: 35 mEq/L — ABNORMAL HIGH (ref 19–32)
CREATININE: 2.12 mg/dL — AB (ref 0.50–1.35)
Chloride: 79 mEq/L — ABNORMAL LOW (ref 96–112)
GFR calc Af Amer: 41 mL/min — ABNORMAL LOW (ref >90)
GFR, EST NON AFRICAN AMERICAN: 36 mL/min — AB (ref >90)
GLUCOSE: 173 mg/dL — AB (ref 70–99)
Potassium: 3.1 mEq/L — ABNORMAL LOW (ref 3.7–5.3)
SODIUM: 126 meq/L — AB (ref 137–147)

## 2013-09-26 LAB — CARBOXYHEMOGLOBIN
Carboxyhemoglobin: 1.7 % — ABNORMAL HIGH (ref 0.5–1.5)
Methemoglobin: 0.3 % (ref 0.0–1.5)
O2 SAT: 68.6 %
TOTAL HEMOGLOBIN: 15 g/dL (ref 13.5–18.0)

## 2013-09-26 LAB — GLUCOSE, CAPILLARY
GLUCOSE-CAPILLARY: 243 mg/dL — AB (ref 70–99)
GLUCOSE-CAPILLARY: 242 mg/dL — AB (ref 70–99)
Glucose-Capillary: 207 mg/dL — ABNORMAL HIGH (ref 70–99)
Glucose-Capillary: 138 mg/dL — ABNORMAL HIGH (ref 70–99)

## 2013-09-26 MED ORDER — POTASSIUM CHLORIDE CRYS ER 20 MEQ PO TBCR
40.0000 meq | EXTENDED_RELEASE_TABLET | Freq: Once | ORAL | Status: AC
  Administered 2013-09-26: 40 meq via ORAL
  Filled 2013-09-26: qty 2

## 2013-09-26 NOTE — Progress Notes (Addendum)
Advanced Heart Failure Rounding Note   Subjective:    Mr. Delatte is a 48y/o male with HTN, DM2, polysunbstance abuse with longstanding HF due to NICM (probably ETOH), EF 10%. Current cocaine abuse. Was previously on [facility].   Admitted with ab pain & n/v/diarrhea. Weight up to 231.   Noncontrast CT ab: Diverticulosis of the colon with mild thickening of the sigmoid colon suggesting early diverticulitis. No abscess. Hepatic cirrhosis with mild ascites and edema throughout the subcutaneous soft tissues. Renal function stable.   Foot pain improved. Drinking excessive amounts of fluid. Milrinone remains at 0.125 yesterday. Weight 210-> 214>221>217.  Co-ox remains 68%. Ambulated 340 feet.   Cr slightly worse 1.9->2.0>1.98>2.1   Denies SOB.    Objective:   Weight Range:  Vital Signs:   Temp:  [97.3 F (36.3 C)-97.9 F (36.6 C)] 97.3 F (36.3 C) (09/09 0300) Pulse Rate:  [57-134] 57 (09/09 0300) Resp:  [16-20] 16 (09/09 0300) BP: (87-112)/(57-75) 87/69 mmHg (09/09 0300) SpO2:  [96 %-99 %] 96 % (09/09 0300) Weight:  [217 lb 13 oz (98.8 kg)] 217 lb 13 oz (98.8 kg) (09/09 0300) Last BM Date: 09/24/13  Weight change: Filed Weights   09/24/13 0500 09/25/13 0400 09/26/13 0300  Weight: 214 lb 2 oz (97.126 kg) 221 lb 1.9 oz (100.3 kg) 217 lb 13 oz (98.8 kg)    Intake/Output:   Intake/Output Summary (Last 24 hours) at 09/26/13 0807 Last data filed at 09/26/13 0600  Gross per 24 hour  Intake 1079.8 ml  Output   3100 ml  Net -2020.2 ml     Physical Exam: CVP 15  General: Chronically ill appearing. No resp difficulty; Sitting on side of bed.  HEENT: normal  Neck: supple. RIJ central line. JVP to jaw. Carotids 2+ bilaterally; no bruits. No lymphadenopathy or thryomegaly appreciated.  Cor: PMI normal. Tachy & regular rhythm. 2/6 systolic murmur LLSB. + S3  Lungs: clear  Abdomen: obese, soft, nontender, non distended. No hepatosplenomegaly. No bruits or masses. Good bowel  sounds.  Extremities: no cyanosis, clubbing, rash. No edema; both feet and 1st MTP sore to touch  Neuro: alert & orientedx3, cranial nerves grossly intact. Moves all 4 extremities w/o difficulty. Affect pleasant.when   Telemetry: ST 100s  Labs: Basic Metabolic Panel:  Recent Labs Lab 09/20/13 0400  09/22/13 0419 09/23/13 0514 09/24/13 0508 09/25/13 0400 09/26/13 0309  NA 130*  < > 127* 127* 127* 126* 126*  K 3.3*  < > 3.5* 3.2* 3.8 3.8 3.1*  CL 83*  < > 77* 76* 79* 78* 79*  CO2 32  < > 38* 39* 36* 35* 35*  GLUCOSE 228*  < > 279* 137* 201* 171* 173*  BUN 60*  < > 54* 60* 73* 78* 79*  CREATININE 1.85*  < > 1.90* 1.94* 2.03* 1.98* 2.12*  CALCIUM 9.0  < > 9.1 9.5 8.5 9.0 9.0  MG 1.9  --   --   --   --   --   --   < > = values in this interval not displayed.  Liver Function Tests: No results found for this basename: AST, ALT, ALKPHOS, BILITOT, PROT, ALBUMIN,  in the last 168 hours No results found for this basename: LIPASE, AMYLASE,  in the last 168 hours No results found for this basename: AMMONIA,  in the last 168 hours  CBC: No results found for this basename: WBC, NEUTROABS, HGB, HCT, MCV, PLT,  in the last 168 hours  Cardiac Enzymes:  Recent Labs Lab 09/20/13 1130 09/20/13 1701  TROPONINI <0.30 <0.30    BNP: BNP (last 3 results)  Recent Labs  07/12/13 1344 09/08/13 2224 09/14/13 1203  PROBNP 5773.0* 6632.0* 10023*     Other results:  EKG:   Imaging: No results found.   Medications:     Scheduled Medications: . allopurinol  100 mg Oral Daily  . aspirin EC  81 mg Oral Daily  . atorvastatin  80 mg Oral q1800  . famotidine  20 mg Oral Daily  . furosemide  80 mg Intravenous TID  . gabapentin  200 mg Oral TID  . heparin  5,000 Units Subcutaneous 3 times per day  . hydrALAZINE  50 mg Oral TID  . insulin aspart  0-20 Units Subcutaneous TID WC  . insulin aspart  0-5 Units Subcutaneous QHS  . insulin aspart  4 Units Subcutaneous TID WC  . insulin  glargine  20 Units Subcutaneous QHS  . isosorbide mononitrate  30 mg Oral Daily  . metolazone  5 mg Oral Q breakfast  . potassium chloride  40 mEq Oral Once  . sodium chloride  3 mL Intravenous Q12H    Infusions: . milrinone 0.125 mcg/kg/min (09/26/13 0612)    PRN Medications: sodium chloride, albuterol, loperamide, LORazepam, morphine injection, ondansetron (ZOFRAN) IV, oxyCODONE, senna, sodium chloride   Assessment:   1. A/c systolic HF with low output physiology  --d/c weight in 6/15 was 206 pounds  2. NICM EF 10-15%  3. A/c renal failure, stage IV (baseline Cr ~2.0-2.5)  4. Hyperkalemia  5. NSVT  6. N/V/diarrhea -  7. H/o polysubstance abuse  8. Noncompliance  9. Hyponatremia/Hypokalemia 10. Acute gout 11. NSVT 12. Hyokalemia  Plan/Discussion:   Volume status improving. Need to limit fluids and hight salt diet. Weight down 4 pounds. CVP down to 15. Continue IV lasix + metolazone.Continue milrinone at 0.125 mcg. Supplement K.   He is not ace due to CKD. No BB due to low output.Not a candidate for home milrinone due to noncompliance and drug abuse.   Will restrict diet and fluid intake.    CLEGG,AMY, NP-C  8:07 AM  Patient seen and examined with Tonye Becket, NP. We discussed all aspects of the encounter. I agree with the assessment and plan as stated above.   Will continue IV lasix, metolazone and milrinone one more day. Hopefully wean milrinone to off tomorrow and switch back to torsemide. Dietary indiscretion continues to be a major issue.  Daniel Bensimhon,MD 8:53 AM

## 2013-09-26 NOTE — Progress Notes (Signed)
Inpatient RN visit- Kaspar Uselton [facility] 2 H Room 23 -[facility] RN Visit-[name] RN Related admission to [facility] diagnosis of Hypertrophic Cardiomyopathy. Pt is Full Code. Pt seen at bedside, ambulating to the bathroom to "wash up", reports "my head is pounding". Pt declined assistance from Clinical research associate or staff, towel and supplies placed in bathroom.  Pt took off his monitor leads, staff RN Josh notified. Chart reviewed, wt and CVP down from yesterday. Continues on milrinone gtt and scheduled IV lasix.  [facility] will continue to follow through discharge. Patient's home medication list and transfer summary in place on shadow chart.   Please call [facility] @ [phone]-  with any hospice needs.   Thank you. Hansel Starling, RN  [facility]  Hospice Liaison  [phone])

## 2013-09-26 NOTE — Progress Notes (Signed)
Subjective: Patient reports he had some mild chest pain again this AM. His Milrinone drip was decreased to 0.125 mcg yesterday, will continue today. Complains that his feet are still hurting.   Objective: Vital signs in last 24 hours: Filed Vitals:   09/25/13 1900 09/25/13 2000 09/25/13 2300 09/26/13 0300  BP: 98/62 98/62 112/75 87/69  Pulse: 80  134 57  Temp: 97.9 F (36.6 C)  97.8 F (36.6 C) 97.3 F (36.3 C)  TempSrc: Oral  Oral Oral  Resp: Height:      Weight:    217 lb 13 oz (98.8 kg)  SpO2: 97%  97% 96%   Weight change: -3 lb 4.9 oz (-1.5 kg)  Intake/Output Summary (Last 24 hours) at 09/26/13 0833 Last data filed at 09/26/13 0600  Gross per 24 hour  Intake 1079.8 ml  Output   3100 ml  Net -2020.2 ml    Physical Exam General: laying in bed, eyes closed, NAD  HEENT: [place]/AT, EOMI, mucus membranes moist  CV: tachycardic, 2/6 systolic ejection murmur  Pulm: CTA bilaterally  Msk: 2+ pitting edema in lower extremities bilaterally  Neuro: alert and oriented x 3, CNs II-XII intact     Lab Results: Basic Metabolic Panel:  Recent Labs Lab 09/20/13 0400  09/25/13 0400 09/26/13 0309  NA 130*  < > 126* 126*  K 3.3*  < > 3.8 3.1*  CL 83*  < > 78* 79*  CO2 32  < > 35* 35*  GLUCOSE 228*  < > 171* 173*  BUN 60*  < > 78* 79*  CREATININE 1.85*  < > 1.98* 2.12*  CALCIUM 9.0  < > 9.0 9.0  MG 1.9  --   --   --   < > = values in this interval not displayed. Liver Function Tests: No results found for this basename: AST, ALT, ALKPHOS, BILITOT, PROT, ALBUMIN,  in the last 168 hours No results found for this basename: LIPASE, AMYLASE,  in the last 168 hours No results found for this basename: AMMONIA,  in the last 168 hours CBC: No results found for this basename: WBC, NEUTROABS, HGB, HCT, MCV, PLT,  in the last 168 hours Cardiac Enzymes:  Recent Labs Lab 09/20/13 1130 09/20/13 1701  TROPONINI <0.30 <0.30   BNP: No results found for this basename:  PROBNP,  in the last 168 hours D-Dimer: No results found for this basename: DDIMER,  in the last 168 hours CBG:  Recent Labs Lab 09/24/13 1639 09/24/13 2119 09/25/13 0801 09/25/13 1151 09/25/13 1643 09/25/13 2130  GLUCAP 352* 402* 175* 190* 267* 132*   Hemoglobin A1C: No results found for this basename: HGBA1C,  in the last 168 hours Fasting Lipid Panel: No results found for this basename: CHOL, HDL, LDLCALC, TRIG, CHOLHDL, LDLDIRECT,  in the last 168 hours Thyroid Function Tests: No results found for this basename: TSH, T4TOTAL, FREET4, T3FREE, THYROIDAB,  in the last 168 hours Coagulation: No results found for this basename: LABPROT, INR,  in the last 168 hours Anemia Panel: No results found for this basename: VITAMINB12, FOLATE, FERRITIN, TIBC, IRON, RETICCTPCT,  in the last 168 hours Urine Drug Screen: Drugs of Abuse     Component Value Date/Time   LABOPIA NONE DETECTED 09/14/2013 2359   LABOPIA NEG 02/01/2012 1356   COCAINSCRNUR NONE DETECTED 09/14/2013 2359   COCAINSCRNUR PPS 02/01/2012 1356   LABBENZ NONE DETECTED 09/14/2013 2359   LABBENZ PPS 02/01/2012 1356   LABBENZ NEG  07/09/2010 1522   AMPHETMU NONE DETECTED 09/14/2013 2359   AMPHETMU NEG 07/09/2010 1522   THCU NONE DETECTED 09/14/2013 2359   LABBARB NONE DETECTED 09/14/2013 2359   LABBARB NEG 02/01/2012 1356    Alcohol Level: No results found for this basename: ETH,  in the last 168 hours Urinalysis: No results found for this basename: COLORURINE, APPERANCEUR, LABSPEC, PHURINE, GLUCOSEU, HGBUR, BILIRUBINUR, KETONESUR, PROTEINUR, UROBILINOGEN, NITRITE, LEUKOCYTESUR,  in the last 168 hours  Micro Results: No results found for this or any previous visit (from the past 240 hour(s)). Studies/Results: No results found. Medications: I have reviewed the patient's current medications. Scheduled Meds: . allopurinol  100 mg Oral Daily  . aspirin EC  81 mg Oral Daily  . atorvastatin  80 mg Oral q1800  . famotidine  20 mg  Oral Daily  . furosemide  80 mg Intravenous TID  . gabapentin  200 mg Oral TID  . heparin  5,000 Units Subcutaneous 3 times per day  . hydrALAZINE  50 mg Oral TID  . insulin aspart  0-20 Units Subcutaneous TID WC  . insulin aspart  0-5 Units Subcutaneous QHS  . insulin aspart  4 Units Subcutaneous TID WC  . insulin glargine  20 Units Subcutaneous QHS  . isosorbide mononitrate  30 mg Oral Daily  . metolazone  5 mg Oral Q breakfast  . potassium chloride  40 mEq Oral Once  . sodium chloride  3 mL Intravenous Q12H   Continuous Infusions: . milrinone 0.125 mcg/kg/min (09/26/13 0612)   PRN Meds:.sodium chloride, albuterol, loperamide, LORazepam, morphine injection, ondansetron (ZOFRAN) IV, oxyCODONE, senna, sodium chloride Assessment/Plan: Mr. Thivierge is a 48yo man w/ PMHx of poorly controlled Type 2 DM, CKD Stage 3, chronic systolic non-ischemic cardiomyopathy with AICD (EF 10%) 2/2 polysubstance abuse (alcohol, cocaine) who was admitted for abdominal pain, nausea, vomiting, and diarrhea and cardiorenal syndrome.   1. Cardiorenal syndrome: CHF complicated by CKD. Weight is 217 lb today, down 4 lbs from yesterday. Patient has not been compliant with his diet, not watching fluid intake. Baseline weight appears to be ~205 lbs. He has diuresed 1.7 L in last 24 hours, -23.9 L since admission. Cr 2.12 today, up from 1.98 yesterday. Cr has been stable around 2. Potassium 3.1 today, was given K-Dur 40 mEq. Pt scheduled to have K-Dur 40 mEq twice daily. Emphasized importance of restricting fluid intake and avoiding salt.  - Heart Failure team following, appreciate recs  - Continue Hydralazine 50 mg TID  - Continue Metolazone 5 mg daily  - Milrinone decreased to 0.125 mcg yesterday, will continue this dose today. Not a home Milrinone candidate due to noncompliance and drug abuse (cocaine)  - Continue Lasix 80 mg IV TID   - Continue Imdur 30 mg daily  - Keep central line to access CVP and CO-Hb  - Avoid  beta blocker due to low output  - Avoid ACE- inhibitor due to acute renal failure  - Continue strict I&Os  - Daily weights   2. Hyponatremia: Patient asymptomatic. Patient's sodium was 125 on 9/1 and he received 1 dose of Tolvaptan 30 mg. Na improved to 130. Na now trending down again, stable at 126-127. Hyponatremia likely 2/2 hypervolemia.  - Consider another dose of Tolvaptan if Na continues to decrease  - Repeat bmet tomorrow AM  - Continue to monitor   3. Acute gout flare: Elbow pain has improved. Pt now having severe pain in feet due to gout. Pt received 3 days of Prednisone  40 mg, which was completed on 9/1 for elbow pain. He has been restarted on Prednisone but is refusing to take the medication because of side effect of elevated blood sugars. Will not give Colchicine given worsening creatinine. Patient states the pain has slightly improved today. Will continue to monitor.  - Continue Allopurinol 100 mg daily '  - Continue Prednisone, encourage pt to take medication. Can increase insulin if decides to take Prednisone.  - Continue Gabapentin 200 mg TID   4. CAD: Continue home atorvastatin 80 mg daily and ASA 81 mg daily.   5. HTN: BP low today, 87/69. - Continue to monitor  - Continue Hydralazine 50 mg TID  - Continue Lasix 80 mg IV TID  - Continue Metolazone 5 mg daily   6. Type 2 DM: Pt's blood sugars have in 130s-260s. Patient noncompliant with diet.  - Continue Lantus 20 units QHS  - Continue Novolog 4 units TID with meals, consider increasing especially if starts taking Prednisone  - Continue insulin sliding scale   7. GERD:  - Continue famotidine 20mg    8. Asthma:  - Continue albuterol 2.5mg  q2h prn for wheezing.   Diet: Carb modified  DVT/PE PPx: Heparin SQ  Dispo: Deferred at this time. Awaiting resolution of medical problems. Discharge likely in 1-2 days if tolerates Milrinone wean.  The patient does have a current PCP Baltazar Apo, MD) and does need an [facility]  hospital follow-up appointment after discharge.  The patient does not have transportation limitations that hinder transportation to clinic appointments.  .Services Needed at time of discharge: Y = Yes, Blank = No PT:   OT:   RN:   Equipment:   Other:     LOS: 12 days   Rich Number, MD 09/26/2013, 8:33 AM

## 2013-09-26 NOTE — Progress Notes (Signed)
RM 2H23 [place]                     [facility] SW Note:  Pt reported that he continues to have issues with fluid and he is aware that in part it is due to his diet. He continues to complain of headaches and feet pain. He reported no needs. SW will continue to follow to extend support.  Exie Parody [place],LCSW

## 2013-09-26 NOTE — Progress Notes (Signed)
Pt seen and examined. Please refer to resident note for details  Pt awake and pleasant today. Complains of pain in his toes and ankle which he ascribes to gout. He apologized for being withdrawn and states he has "a lot going on"  Exam:  Gen- AAO*3  Cardio- tachycardic, SEM +  Lungs- CTA b/l  Ext- improving LE edema, tenderness over toes of R foot esp big toe but only minimal swelling, no erythema  Abd- soft, non tender, BS +   Assessment and Plan:  48 y/o man with cardiorenal syndrome now improving on milrinone and diuretics   Cardiorenal syndrome:  - Cardio following  - c/w milrinone gtt for now. Cardio to attempt to d/c gtt in AM - c/w hydralazine, imdur  - Now off lasix gtt. C/w IV lasix for now. To be switched to PO torsemide - likely in AM -  c/w metolazone   Hyponatremia:  - Na mildly decreased today. Will monitor  - Likely hypervolemic hyponatremia. Will consider another dose of tolvaptan if worsening Na   DM:  - Would c/w current regimen and monitor. Only 1 BS of over 200. May need to increase lantus to 22 units if BS remain high  Gout: - Pt with gouty flare of R foot. Refuses prednisone at this time - c/w allopurinol. Avoid colchicine secondary to increased Cr

## 2013-09-26 NOTE — Progress Notes (Signed)
09/26/13 1400  Clinical Encounter Type  Visited With Patient  Visit Type Spiritual support;Other (Comment) (Spiritual Care Consult)  Referral From Nurse  Stress Factors  Patient Stress Factors Major life changes  Family Stress Factors None identified   Chaplain received a referral to visit with patient via a Spiritual Care Consult. Chaplain visited with patient. Patient indicated that he did not wish to speak with Chaplain and needed to rest. Clotilde Dieter, Tommi Emery, Chaplain 2:45 PM

## 2013-09-26 NOTE — Progress Notes (Signed)
CARDIAC REHAB PHASE I   PRE:  Rate/Rhythm: 106 ST with PVC's  BP:  Supine: 94/59  Sitting:   Standing:    SaO2: 97 RA  MODE:  Ambulation: 390 ft   POST:  Rate/Rhythm: 116 ST with PVC's  BP:  Supine:   Sitting: 106/72  Standing:    SaO2: 100 RA 1200-1235 Pt tolerated ambulation fair. He c/o of foot pain with walking, "gout pain." He also c/o of feeling like his head is spinning. Gait steady, slow pace. He was able to walk 390 feet without c/o of SOB. Pt to side of bed after walk with call light in reach. He was asking for his pain medication by end of walk, reported to RN.  Melina Copa RN 09/26/2013 12:29 PM

## 2013-09-27 DIAGNOSIS — N179 Acute kidney failure, unspecified: Secondary | ICD-10-CM

## 2013-09-27 LAB — BASIC METABOLIC PANEL
ANION GAP: 12 (ref 5–15)
BUN: 85 mg/dL — ABNORMAL HIGH (ref 6–23)
CO2: 35 mEq/L — ABNORMAL HIGH (ref 19–32)
Calcium: 8.9 mg/dL (ref 8.4–10.5)
Chloride: 81 mEq/L — ABNORMAL LOW (ref 96–112)
Creatinine, Ser: 2.18 mg/dL — ABNORMAL HIGH (ref 0.50–1.35)
GFR, EST AFRICAN AMERICAN: 40 mL/min — AB (ref >90)
GFR, EST NON AFRICAN AMERICAN: 35 mL/min — AB (ref >90)
GLUCOSE: 116 mg/dL — AB (ref 70–99)
POTASSIUM: 3.5 meq/L — AB (ref 3.7–5.3)
SODIUM: 128 meq/L — AB (ref 137–147)

## 2013-09-27 LAB — GLUCOSE, CAPILLARY
GLUCOSE-CAPILLARY: 203 mg/dL — AB (ref 70–99)
Glucose-Capillary: 112 mg/dL — ABNORMAL HIGH (ref 70–99)
Glucose-Capillary: 220 mg/dL — ABNORMAL HIGH (ref 70–99)
Glucose-Capillary: 255 mg/dL — ABNORMAL HIGH (ref 70–99)

## 2013-09-27 LAB — CARBOXYHEMOGLOBIN
Carboxyhemoglobin: 1.5 % (ref 0.5–1.5)
METHEMOGLOBIN: 0.5 % (ref 0.0–1.5)
O2 SAT: 65.4 %
Total hemoglobin: 14.1 g/dL (ref 13.5–18.0)

## 2013-09-27 MED ORDER — POTASSIUM CHLORIDE CRYS ER 20 MEQ PO TBCR
40.0000 meq | EXTENDED_RELEASE_TABLET | Freq: Once | ORAL | Status: AC
  Administered 2013-09-27: 40 meq via ORAL
  Filled 2013-09-27: qty 2

## 2013-09-27 MED ORDER — MAGNESIUM SULFATE IN D5W 10-5 MG/ML-% IV SOLN
1.0000 g | Freq: Once | INTRAVENOUS | Status: AC
  Administered 2013-09-27: 1 g via INTRAVENOUS
  Filled 2013-09-27: qty 100

## 2013-09-27 NOTE — Progress Notes (Signed)
0037 Pt observed walking independently. Gave encouragement. Pt stated he needed to get out of room. Gait looks steady. Will follow up if time permits as pt can walk independently. Luetta Nutting RN BSN 09/27/2013 9:18 AM

## 2013-09-27 NOTE — Progress Notes (Signed)
Advanced Heart Failure Rounding Note   Subjective:    [name] is a 48y/o male with HTN, DM2, polysunbstance abuse with longstanding HF due to NICM (probably ETOH), EF 10%. Current cocaine abuse. Was previously on [facility].   Admitted with ab pain & n/v/diarrhea. Weight up to 231.   Noncontrast CT ab: Diverticulosis of the colon with mild thickening of the sigmoid colon suggesting early diverticulitis. No abscess. Hepatic cirrhosis with mild ascites and edema throughout the subcutaneous soft tissues. Renal function stable.   Foot pain improved. Drinking < 2 liters.  Milrinone remains at 0.125 yesterday. Weight 210-> 214>221>217>216 .  Co-ox remains 65%.   Cr slightly worse 1.9->2.0>1.98>2.1>2.18   Denies SOB/CP    Objective:   Weight Range:  Vital Signs:   Temp:  [97.4 F (36.3 C)-98.9 F (37.2 C)] 98.9 F (37.2 C) (09/10 0346) Pulse Rate:  [75-111] 75 (09/10 0031) BP: (94-122)/(59-78) 96/69 mmHg (09/10 0346) SpO2:  [95 %-97 %] 95 % (09/10 0346) Weight:  [216 lb (97.977 kg)] 216 lb (97.977 kg) (09/10 0346) Last BM Date: 09/24/13  Weight change: Filed Weights   09/25/13 0400 09/26/13 0300 09/27/13 0346  Weight: 221 lb 1.9 oz (100.3 kg) 217 lb 13 oz (98.8 kg) 216 lb (97.977 kg)    Intake/Output:   Intake/Output Summary (Last 24 hours) at 09/27/13 0738 Last data filed at 09/27/13 0351  Gross per 24 hour  Intake  581.7 ml  Output   4250 ml  Net -3668.3 ml     Physical Exam: CVP 11  General: Chronically ill appearing. No resp difficulty; Lying in bed.   HEENT: normal  Neck: supple. RIJ central line. JVP 9-10. Carotids 2+ bilaterally; no bruits. No lymphadenopathy or thryomegaly appreciated.  Cor: PMI normal. Tachy & regular rhythm. 2/6 systolic murmur LLSB. + S3  Lungs: clear  Abdomen: obese, soft, nontender, non distended. No hepatosplenomegaly. No bruits or masses. Good bowel sounds.  Extremities: no cyanosis, clubbing, rash. R and LLE 1+ edema. No edema;  both feet and 1st MTP sore to touch  Neuro: alert & orientedx3, cranial nerves grossly intact. Moves all 4 extremities w/o difficulty. Affect pleasant.when   Telemetry: ST PVCs 100s  Labs: Basic Metabolic Panel:  Recent Labs Lab 09/23/13 0514 09/24/13 0508 09/25/13 0400 09/26/13 0309 09/27/13 0400  NA 127* 127* 126* 126* 128*  K 3.2* 3.8 3.8 3.1* 3.5*  CL 76* 79* 78* 79* 81*  CO2 39* 36* 35* 35* 35*  GLUCOSE 137* 201* 171* 173* 116*  BUN 60* 73* 78* 79* 85*  CREATININE 1.94* 2.03* 1.98* 2.12* 2.18*  CALCIUM 9.5 8.5 9.0 9.0 8.9    Liver Function Tests: No results found for this basename: AST, ALT, ALKPHOS, BILITOT, PROT, ALBUMIN,  in the last 168 hours No results found for this basename: LIPASE, AMYLASE,  in the last 168 hours No results found for this basename: AMMONIA,  in the last 168 hours  CBC: No results found for this basename: WBC, NEUTROABS, HGB, HCT, MCV, PLT,  in the last 168 hours  Cardiac Enzymes:  Recent Labs Lab 09/20/13 1130 09/20/13 1701  TROPONINI <0.30 <0.30    BNP: BNP (last 3 results)  Recent Labs  07/12/13 1344 09/08/13 2224 09/14/13 1203  PROBNP 5773.0* 6632.0* 10023*     Other results:  EKG:   Imaging: No results found.   Medications:     Scheduled Medications: . allopurinol  100 mg Oral Daily  . aspirin EC  81 mg Oral Daily  .  atorvastatin  80 mg Oral q1800  . famotidine  20 mg Oral Daily  . furosemide  80 mg Intravenous TID  . gabapentin  200 mg Oral TID  . heparin  5,000 Units Subcutaneous 3 times per day  . hydrALAZINE  50 mg Oral TID  . insulin aspart  0-20 Units Subcutaneous TID WC  . insulin aspart  0-5 Units Subcutaneous QHS  . insulin aspart  4 Units Subcutaneous TID WC  . insulin glargine  20 Units Subcutaneous QHS  . isosorbide mononitrate  30 mg Oral Daily  . metolazone  5 mg Oral Q breakfast  . sodium chloride  3 mL Intravenous Q12H    Infusions: . milrinone 0.125 mcg/kg/min (09/26/13 1800)     PRN Medications: sodium chloride, albuterol, loperamide, LORazepam, morphine injection, ondansetron (ZOFRAN) IV, oxyCODONE, senna, sodium chloride   Assessment:   1. A/c systolic HF with low output physiology  --d/c weight in 6/15 was 206 pounds  2. NICM EF 10-15%  3. A/c renal failure, stage IV (baseline Cr ~2.0-2.5)  4. Hyperkalemia  5. NSVT  6. N/V/diarrhea -  7. H/o polysubstance abuse  8. Noncompliance  9. Hyponatremia/Hypokalemia 10. Acute gout 11. NSVT 12. Hyokalemia  Plan/Discussion:   Volume status improving. CVP 11. One more day IV lasix + metolazone.Continue milrinone at 0.125 mcg. Supplement K.   Continue hydralazine 50 mg tid and imdur 30 mg daily. He is not ace due to CKD. No BB due to low output.Not a candidate for home milrinone due to noncompliance and drug abuse.   Doing better with dietary restrictions.  Continue to ambulate with cardiac rehab. Add ted hose.   [facility] following.    CLEGG,AMY, NP-C  7:38 AM  Patient seen with NP, agree with the above note.  He still has some volume overload.  Continue current diuretics and milrinone one more day, will convert to po and stop milrinone tomorrow hopefully.   [name] 09/27/2013 11:02 AM

## 2013-09-27 NOTE — Progress Notes (Signed)
Inpatient RN visit-Alanzo Holthaus [facility] 2H  Room 23-[facility] RN Visit-[name] RN Related admission to [facility] diagnosis of Hypertrophic Cardiomyopathy. Pt is Full Code.  Pt seen at bedside, alert & oriented, lying in bed, on the phone. Interactive with Clinical research associate, reported continued head ache. Dr. Beckie Salts in during visit, pt reported to her that he was having chest "pressure" at night. He also reported his continuous headache. Per chart review, pt to remain on IV milrinone and IV lasix through today. [facility] will continue to follow.  Patient's home medication list and transfer summary in place on shadow chart.  Please call [facility] @ [phone]-  with any hospice needs.   Thank you. Hansel Starling, RN  [facility]  Hospice Liaison  [phone])

## 2013-09-27 NOTE — Progress Notes (Signed)
Pt seen and examined. Please refer to resident note for details   Pt feels better today. States his swelling is going down. Still with pain in R foot but refuses prednisone for gout.  Exam:  Gen- AAO*3  Cardio- tachycardic, SEM +  Lungs- CTA b/l  Ext- improving LE edema, tenderness over toes of R foot Abd- soft, non tender, BS +   Assessment and Plan:  48 y/o man with cardiorenal syndrome now improving on milrinone and diuretics   Cardiorenal syndrome:  - Cardio following. Pt still with volume overload but improving - c/w milrinone gtt for now. Cardio to attempt to d/c gtt in AM  - c/w hydralazine, imdur  - C/w IV lasix for now. To be switched to PO torsemide - likely in AM  - c/w metolazone   Hyponatremia:  - Will monitor  - Likely hypervolemic hyponatremia. Na stable. Will hold off on further tolvaptan for now  DM:  - Would c/w current regimen and monitor. BS improvig. Pt is more compliant wuth diet  Gout:  - Pt with gouty flare of R foot. Refuses prednisone at this time  - c/w allopurinol. Avoid colchicine secondary to increased Cr

## 2013-09-27 NOTE — Progress Notes (Signed)
Subjective:  Objective: Vital signs in last 24 hours: Filed Vitals:   09/27/13 0031 09/27/13 0346 09/27/13 0757 09/27/13 1230  BP: 104/63 96/69 104/64 113/85  Pulse: 75     Temp: 97.4 F (36.3 C) 98.9 F (37.2 C) 98.2 F (36.8 C) 97.8 F (36.6 C)  TempSrc: Oral Oral Oral Oral  Resp:      Height:      Weight:  216 lb (97.977 kg)    SpO2: 96% 95% 98% 97%   Weight change: -1 lb 13 oz (-0.823 kg)  Intake/Output Summary (Last 24 hours) at 09/27/13 1417 Last data filed at 09/27/13 0351  Gross per 24 hour  Intake   65.4 ml  Output   2750 ml  Net -2684.6 ml   Physical Exam General: laying in bed, NAD HEENT: [place]/AT, EOMI, mucus membranes moist CV: tachycardic, 2/6 systolic ejection murmur Pulm: CTA bilaterally Msk: 2+ pitting edema in lower extremities Neuro: alert and oriented x 3, CNs II-XII intact   Lab Results: Basic Metabolic Panel:  Recent Labs Lab 09/26/13 0309 09/27/13 0400  NA 126* 128*  K 3.1* 3.5*  CL 79* 81*  CO2 35* 35*  GLUCOSE 173* 116*  BUN 79* 85*  CREATININE 2.12* 2.18*  CALCIUM 9.0 8.9   Liver Function Tests: No results found for this basename: AST, ALT, ALKPHOS, BILITOT, PROT, ALBUMIN,  in the last 168 hours No results found for this basename: LIPASE, AMYLASE,  in the last 168 hours No results found for this basename: AMMONIA,  in the last 168 hours CBC: No results found for this basename: WBC, NEUTROABS, HGB, HCT, MCV, PLT,  in the last 168 hours Cardiac Enzymes:  Recent Labs Lab 09/20/13 1701  TROPONINI <0.30   BNP: No results found for this basename: PROBNP,  in the last 168 hours D-Dimer: No results found for this basename: DDIMER,  in the last 168 hours CBG:  Recent Labs Lab 09/26/13 0850 09/26/13 1155 09/26/13 1737 09/26/13 2048 09/27/13 0808 09/27/13 1226  GLUCAP 207* 138* 243* 242* 112* 220*   Hemoglobin A1C: No results found for this basename: HGBA1C,  in the last 168 hours Fasting Lipid Panel: No results found  for this basename: CHOL, HDL, LDLCALC, TRIG, CHOLHDL, LDLDIRECT,  in the last 168 hours Thyroid Function Tests: No results found for this basename: TSH, T4TOTAL, FREET4, T3FREE, THYROIDAB,  in the last 168 hours Coagulation: No results found for this basename: LABPROT, INR,  in the last 168 hours Anemia Panel: No results found for this basename: VITAMINB12, FOLATE, FERRITIN, TIBC, IRON, RETICCTPCT,  in the last 168 hours Urine Drug Screen: Drugs of Abuse     Component Value Date/Time   LABOPIA NONE DETECTED 09/14/2013 2359   LABOPIA NEG 02/01/2012 1356   COCAINSCRNUR NONE DETECTED 09/14/2013 2359   COCAINSCRNUR PPS 02/01/2012 1356   LABBENZ NONE DETECTED 09/14/2013 2359   LABBENZ PPS 02/01/2012 1356   LABBENZ NEG 07/09/2010 1522   AMPHETMU NONE DETECTED 09/14/2013 2359   AMPHETMU NEG 07/09/2010 1522   THCU NONE DETECTED 09/14/2013 2359   LABBARB NONE DETECTED 09/14/2013 2359   LABBARB NEG 02/01/2012 1356    Alcohol Level: No results found for this basename: ETH,  in the last 168 hours Urinalysis: No results found for this basename: COLORURINE, APPERANCEUR, LABSPEC, PHURINE, GLUCOSEU, HGBUR, BILIRUBINUR, KETONESUR, PROTEINUR, UROBILINOGEN, NITRITE, LEUKOCYTESUR,  in the last 168 hours   Micro Results: No results found for this or any previous visit (from the past 240 hour(s)). Studies/Results:  No results found. Medications: I have reviewed the patient's current medications. Scheduled Meds: . allopurinol  100 mg Oral Daily  . aspirin EC  81 mg Oral Daily  . atorvastatin  80 mg Oral q1800  . famotidine  20 mg Oral Daily  . furosemide  80 mg Intravenous TID  . gabapentin  200 mg Oral TID  . heparin  5,000 Units Subcutaneous 3 times per day  . hydrALAZINE  50 mg Oral TID  . insulin aspart  0-20 Units Subcutaneous TID WC  . insulin aspart  0-5 Units Subcutaneous QHS  . insulin aspart  4 Units Subcutaneous TID WC  . insulin glargine  20 Units Subcutaneous QHS  . isosorbide mononitrate   30 mg Oral Daily  . metolazone  5 mg Oral Q breakfast  . sodium chloride  3 mL Intravenous Q12H   Continuous Infusions: . milrinone 0.125 mcg/kg/min (09/27/13 0744)   PRN Meds:.sodium chloride, albuterol, loperamide, LORazepam, morphine injection, ondansetron (ZOFRAN) IV, oxyCODONE, senna, sodium chloride Assessment/Plan: Mr. Elza is a 48yo man w/ PMHx of poorly controlled Type 2 DM, CKD Stage 3, chronic systolic non-ischemic cardiomyopathy with AICD (EF 10%) 2/2 polysubstance abuse (alcohol, cocaine) who was admitted for abdominal pain, nausea, vomiting, and diarrhea and cardiorenal syndrome.   1. Cardiorenal syndrome: CHF complicated by CKD. Weight is 216 lb today, down 1 lb from yesterday. Patient has not been compliant with his diet, not watching fluid intake. Baseline weight appears to be ~205 lbs. He has diuresed 3.6 L in last 24 hours, -27.6 L since admission. Cr 2.18 today. Cr has been stable around 2. Potassium 3.5 today, was given K-Dur 40 mEq. Emphasized importance of restricting fluid intake and avoiding salt.  - Heart Failure team following, appreciate recs  - Continue Hydralazine 50 mg TID  - Continue Metolazone 5 mg daily  - Milrinone decreased to 0.125 mcg on 9/8, will continue this regimen today and d/c tomorrow. Not a home Milrinone candidate due to noncompliance and drug abuse (cocaine)  - Continue Lasix 80 mg IV TID  - Continue Imdur 30 mg daily  - Keep central line to access CVP and CO-Hb  - Avoid beta blocker due to low output  - Avoid ACE- inhibitor due to acute renal failure  - Continue strict I&Os  - Daily weights   2. Hyponatremia: Patient asymptomatic. Patient's sodium was 125 on 9/1 and he received 1 dose of Tolvaptan 30 mg. Na improved to 130. Na now trending down again, stable at 126-128. Hyponatremia likely 2/2 hypervolemia.  - Consider another dose of Tolvaptan if Na continues to decrease  - Repeat bmet tomorrow AM  - Continue to monitor   3. Acute gout  flare: Elbow pain has improved. Pt now having severe pain in feet due to gout. Pt received 3 days of Prednisone 40 mg, which was completed on 9/1 for elbow pain. He has been restarted on Prednisone but is refusing to take the medication because of side effect of elevated blood sugars. Will not give Colchicine given worsening creatinine. Patient states the pain has slightly improved today. Will continue to monitor.  - Continue Allopurinol 100 mg daily  - Continue Prednisone, encourage pt to take medication. Can increase insulin if decides to take Prednisone.  - Continue Gabapentin 200 mg TID   4. CAD: Continue home atorvastatin 80 mg daily and ASA 81 mg daily.   5. HTN: BP 113/85.  - Continue to monitor  - Continue Hydralazine 50 mg TID  -  Continue Lasix 80 mg IV TID  - Continue Metolazone 5 mg daily   6. Type 2 DM: Pt's blood sugars have in 130s-260s. Patient noncompliant with diet.  - Continue Lantus 20 units QHS  - Continue Novolog 4 units TID with meals, consider increasing especially if starts taking Prednisone  - Continue insulin sliding scale   7. GERD:  - Continue famotidine 20mg    8. Asthma:  - Continue albuterol 2.5mg  q2h prn for wheezing.   Diet: Carb modified  DVT/PE PPx: Heparin SQ  Dispo: Deferred at this time. Awaiting resolution of medical problems. Discharge likely in 1-2 days if tolerates Milrinone wean.    The patient does have a current PCP Baltazar Apo, MD) and does need an [facility] hospital follow-up appointment after discharge.  The patient does not have transportation limitations that hinder transportation to clinic appointments.  .Services Needed at time of discharge: Y = Yes, Blank = No PT:   OT:   RN:   Equipment:   Other:     LOS: 13 days   Rich Number, MD 09/27/2013, 2:17 PM

## 2013-09-27 NOTE — Progress Notes (Signed)
13 beat run of VT noted @ 1522, asmyptomatic , MD made aware. IV magnesium ordered for today,along with magnesium blood level to be drawn in am per MD. Will continue monitoring patient.

## 2013-09-28 LAB — BASIC METABOLIC PANEL
Anion gap: 14 (ref 5–15)
BUN: 80 mg/dL — AB (ref 6–23)
CO2: 35 mEq/L — ABNORMAL HIGH (ref 19–32)
CREATININE: 2.09 mg/dL — AB (ref 0.50–1.35)
Calcium: 9.1 mg/dL (ref 8.4–10.5)
Chloride: 80 mEq/L — ABNORMAL LOW (ref 96–112)
GFR calc Af Amer: 42 mL/min — ABNORMAL LOW (ref >90)
GFR, EST NON AFRICAN AMERICAN: 36 mL/min — AB (ref >90)
GLUCOSE: 154 mg/dL — AB (ref 70–99)
Potassium: 3.3 mEq/L — ABNORMAL LOW (ref 3.7–5.3)
SODIUM: 129 meq/L — AB (ref 137–147)

## 2013-09-28 LAB — GLUCOSE, CAPILLARY
GLUCOSE-CAPILLARY: 152 mg/dL — AB (ref 70–99)
GLUCOSE-CAPILLARY: 199 mg/dL — AB (ref 70–99)
GLUCOSE-CAPILLARY: 172 mg/dL — AB (ref 70–99)
Glucose-Capillary: 137 mg/dL — ABNORMAL HIGH (ref 70–99)

## 2013-09-28 LAB — CARBOXYHEMOGLOBIN
Carboxyhemoglobin: 1.6 % — ABNORMAL HIGH (ref 0.5–1.5)
METHEMOGLOBIN: 0.5 % (ref 0.0–1.5)
O2 Saturation: 64.3 %
Total hemoglobin: 15.4 g/dL (ref 13.5–18.0)

## 2013-09-28 LAB — MAGNESIUM: Magnesium: 2.4 mg/dL (ref 1.5–2.5)

## 2013-09-28 MED ORDER — BACIT-POLY-NEO HC 1 % EX OINT
TOPICAL_OINTMENT | Freq: Two times a day (BID) | CUTANEOUS | Status: DC
  Administered 2013-09-28: 1 via TOPICAL
  Administered 2013-09-28: 13:00:00 via TOPICAL
  Administered 2013-09-29: 1 via TOPICAL
  Administered 2013-09-29 – 2013-10-05 (×9): via TOPICAL
  Administered 2013-10-06: 1 via TOPICAL
  Administered 2013-10-06: 21:00:00 via TOPICAL
  Filled 2013-09-28 (×2): qty 15

## 2013-09-28 MED ORDER — PREDNISONE 20 MG PO TABS
40.0000 mg | ORAL_TABLET | Freq: Every day | ORAL | Status: AC
  Administered 2013-09-29 – 2013-10-01 (×3): 40 mg via ORAL
  Filled 2013-09-28 (×3): qty 2

## 2013-09-28 MED ORDER — POTASSIUM CHLORIDE CRYS ER 20 MEQ PO TBCR
40.0000 meq | EXTENDED_RELEASE_TABLET | Freq: Two times a day (BID) | ORAL | Status: DC
  Administered 2013-09-28 – 2013-10-04 (×13): 40 meq via ORAL
  Filled 2013-09-28 (×15): qty 2

## 2013-09-28 MED ORDER — FUROSEMIDE 10 MG/ML IJ SOLN
20.0000 mg/h | INTRAVENOUS | Status: DC
  Administered 2013-09-28 – 2013-09-29 (×2): 15 mg/h via INTRAVENOUS
  Administered 2013-09-29 – 2013-10-08 (×14): 20 mg/h via INTRAVENOUS
  Filled 2013-09-28 (×37): qty 25

## 2013-09-28 NOTE — Progress Notes (Signed)
Refused heparin SQ injection.

## 2013-09-28 NOTE — Progress Notes (Signed)
Dietary information provided to patient: 1 giant pickle=1160mg  of sodium.    [name] family member purchased an entire 1/2 gal jug of pickles for him and he placed it in his bedside night stand drawer.    Although "pickle education" provided, [name] continued to finish his current pickle which was half eaten when I showed him the nutritional information on the pickle jar. [name] verbalized understanding and teachback technique was used.    [name] stated: "Well, I'll save those pickles for when I go home".  [name] has not followed his dietary restrictions/recommendations as they pertain to his illness.  I am unable to remove the pickles from his room as they are his personal property.  I can only give him the proper "pickle education".

## 2013-09-28 NOTE — Progress Notes (Signed)
1415 Cardiac Rehab Attempted to get to ambulate in hall, he declines states that his feet hurt and he is trying to rest. He states that he will later today. I encouraged him to walk,since he has not walked in the hall today. Beatrix Fetters, RN 09/28/2013 2:17 PM

## 2013-09-28 NOTE — Progress Notes (Signed)
Subjective:  [name]. Patient up one pound today and CVP 24. On interview he expressed frustration with his medical team and was demanding snacks between his scheduled meals.   Objective: Vital signs in last 24 hours: Filed Vitals:   09/27/13 1900 09/27/13 2300 09/28/13 0300 09/28/13 0800  BP: 97/60 120/72 126/76 97/69  Pulse: 102 104 91 84  Temp: 97.5 F (36.4 C) 97.6 F (36.4 C) 98.5 F (36.9 C) 98.7 F (37.1 C)  TempSrc: Oral Oral Oral Oral  Resp: Height:      Weight:   217 lb (98.431 kg)   SpO2: 97% 97% 97% 96%   Weight change: 1 lb (0.454 kg)  Intake/Output Summary (Last 24 hours) at 09/28/13 1311 Last data filed at 09/28/13 0700  Gross per 24 hour  Intake  795.2 ml  Output   2500 ml  Net -1704.8 ml   Physical Exam General: laying in bed, NAD HEENT: [place]/AT, EOMI, mucus membranes moist CV: tachycardic, 2/6 systolic ejection murmur Pulm: CTA bilaterally Msk: 2+ pitting edema in lower extremities, R toes TTP Neuro: alert and oriented x 3, CNs II-XII intact   Lab Results: Basic Metabolic Panel:  Recent Labs Lab 09/27/13 0400 09/28/13 0500  NA 128* 129*  K 3.5* 3.3*  CL 81* 80*  CO2 35* 35*  GLUCOSE 116* 154*  BUN 85* 80*  CREATININE 2.18* 2.09*  CALCIUM 8.9 9.1  MG  --  2.4   Liver Function Tests: No results found for this basename: AST, ALT, ALKPHOS, BILITOT, PROT, ALBUMIN,  in the last 168 hours No results found for this basename: LIPASE, AMYLASE,  in the last 168 hours No results found for this basename: AMMONIA,  in the last 168 hours CBC: No results found for this basename: WBC, NEUTROABS, HGB, HCT, MCV, PLT,  in the last 168 hours Cardiac Enzymes: No results found for this basename: CKTOTAL, CKMB, CKMBINDEX, TROPONINI,  in the last 168 hours BNP: No results found for this basename: PROBNP,  in the last 168 hours D-Dimer: No results found for this basename: DDIMER,  in the last 168 hours CBG:  Recent Labs Lab 09/27/13 0808  09/27/13 1226 09/27/13 1659 09/27/13 2121 09/28/13 0809 09/28/13 1127  GLUCAP 112* 220* 203* 255* 152* 199*   Hemoglobin A1C: No results found for this basename: HGBA1C,  in the last 168 hours Fasting Lipid Panel: No results found for this basename: CHOL, HDL, LDLCALC, TRIG, CHOLHDL, LDLDIRECT,  in the last 168 hours Thyroid Function Tests: No results found for this basename: TSH, T4TOTAL, FREET4, T3FREE, THYROIDAB,  in the last 168 hours Coagulation: No results found for this basename: LABPROT, INR,  in the last 168 hours Anemia Panel: No results found for this basename: VITAMINB12, FOLATE, FERRITIN, TIBC, IRON, RETICCTPCT,  in the last 168 hours Urine Drug Screen: Drugs of Abuse     Component Value Date/Time   LABOPIA NONE DETECTED 09/14/2013 2359   LABOPIA NEG 02/01/2012 1356   COCAINSCRNUR NONE DETECTED 09/14/2013 2359   COCAINSCRNUR PPS 02/01/2012 1356   LABBENZ NONE DETECTED 09/14/2013 2359   LABBENZ PPS 02/01/2012 1356   LABBENZ NEG 07/09/2010 1522   AMPHETMU NONE DETECTED 09/14/2013 2359   AMPHETMU NEG 07/09/2010 1522   THCU NONE DETECTED 09/14/2013 2359   LABBARB NONE DETECTED 09/14/2013 2359   LABBARB NEG 02/01/2012 1356    Alcohol Level: No results found for this basename: ETH,  in the last 168 hours Urinalysis: No results found for  this basename: COLORURINE, APPERANCEUR, LABSPEC, PHURINE, GLUCOSEU, HGBUR, BILIRUBINUR, KETONESUR, PROTEINUR, UROBILINOGEN, NITRITE, LEUKOCYTESUR,  in the last 168 hours   Micro Results: No results found for this or any previous visit (from the past 240 hour(s)). Studies/Results: No results found. Medications: I have reviewed the patient's current medications. Scheduled Meds: . allopurinol  100 mg Oral Daily  . aspirin EC  81 mg Oral Daily  . atorvastatin  80 mg Oral q1800  . bacitracin-neomycin-polymyxin-hydrocortisone   Topical BID  . famotidine  20 mg Oral Daily  . gabapentin  200 mg Oral TID  . heparin  5,000 Units Subcutaneous 3  times per day  . hydrALAZINE  50 mg Oral TID  . insulin aspart  0-20 Units Subcutaneous TID WC  . insulin aspart  0-5 Units Subcutaneous QHS  . insulin aspart  4 Units Subcutaneous TID WC  . insulin glargine  20 Units Subcutaneous QHS  . isosorbide mononitrate  30 mg Oral Daily  . metolazone  5 mg Oral Q breakfast  . potassium chloride  40 mEq Oral BID  . [START ON 09/29/2013] predniSONE  40 mg Oral Q breakfast  . sodium chloride  3 mL Intravenous Q12H   Continuous Infusions: . furosemide (LASIX) infusion 15 mg/hr (09/28/13 1104)  . milrinone 0.125 mcg/kg/min (09/28/13 1104)   PRN Meds:.sodium chloride, albuterol, loperamide, LORazepam, morphine injection, ondansetron (ZOFRAN) IV, oxyCODONE, senna, sodium chloride Assessment/Plan: Mr. Nydam is a 48yo man w/ PMHx of poorly controlled Type 2 DM, CKD Stage 3, chronic systolic non-ischemic cardiomyopathy with AICD (EF 10%) 2/2 polysubstance abuse (alcohol, cocaine) who was admitted for abdominal pain, nausea, vomiting, and diarrhea and cardiorenal syndrome.   1. Cardiorenal syndrome: CHF complicated by CKD. Weight is 217 lb today, up 1 lb from yesterday. Patient has not been compliant with his diet, not watching fluid intake. Baseline weight appears to be ~205 lbs. He has diuresed 2.8 L in last 24 hours, -29.1 L since admission. Cr 2.09 today. Cr has been stable around 2. Potassium 3.3 today. Emphasized importance of restricting fluid intake and avoiding salt.  - Heart Failure team following, appreciate recs  - Continue Hydralazine 50 mg TID  - Continue Metolazone 5 mg daily  - Milrinone decreased to 0.125 mcg on 9/8, will continue this regimen today per HF team. Not a home Milrinone candidate due to noncompliance and drug abuse (cocaine)  - Lasix 80 mg IV TID transitioned to lasix drip 15 mg/hr per HF - Continue Imdur 30 mg daily  -KDur 40 mg po BID - Keep central line to access CVP and CO-Hb  - Avoid beta blocker due to low output  -  Avoid ACE- inhibitor due to acute renal failure  - Continue strict I&Os  - Daily weights   2. Hyponatremia: Patient asymptomatic. Patient's sodium was 125 on 9/1 and he received 1 dose of Tolvaptan 30 mg. Na improved to 130. Na now trending down again, stable at 126-129. Hyponatremia likely 2/2 hypervolemia.  - Consider another dose of Tolvaptan if Na continues to decrease  - Repeat bmet tomorrow AM  - Continue to monitor   3. Acute gout flare: Elbow pain has improved. Pt now having severe pain in feet due to gout. Pt received 3 days of Prednisone 40 mg, which was completed on 9/1 for elbow pain. He has been restarted on Prednisone but is refusing to take the medication because of side effect of elevated blood sugars. Will not give Colchicine given worsening creatinine. Patient states the  pain has slightly improved today. Will continue to monitor.  - Continue Allopurinol 100 mg daily  - Order Prednisone 40 mg daily x 3 d, encourage pt to take medication. Can increase insulin if decides to take Prednisone.  - Continue Gabapentin 200 mg TID   4. CAD: Continue home atorvastatin 80 mg daily and ASA 81 mg daily.   5. HTN: BP 90s-100s/60s-70s.  - Continue to monitor  - Continue Hydralazine 50 mg TID  - Continue Lasix drip as above - Continue Metolazone 5 mg daily   6. Type 2 DM: Pt's blood sugars have in 150s-250s. Patient noncompliant with diet.  - Continue Lantus 20 units QHS  - Continue Novolog 4 units TID with meals, consider increasing especially if starts taking Prednisone  - Continue insulin sliding scale   7. GERD:  - Continue famotidine 20mg    8. Asthma:  - Continue albuterol 2.5mg  q2h prn for wheezing.   Diet: Carb modified  DVT/PE PPx: Heparin SQ  Dispo: Deferred at this time. Awaiting resolution of medical problems. Discharge likely in 1-2 days if tolerates Milrinone wean.    The patient does have a current PCP Baltazar Apo, MD) and does need an [facility] hospital follow-up  appointment after discharge.  The patient does not have transportation limitations that hinder transportation to clinic appointments.  .Services Needed at time of discharge: Y = Yes, Blank = No PT:   OT:   RN:   Equipment:   Other:     LOS: 14 days   Lorenda Hatchet, MD 09/28/2013, 1:11 PM

## 2013-09-28 NOTE — Progress Notes (Signed)
Pt seen and examined with Dr.Glenn. Please refer to resident note for details   Pt c/o pain in his right foot which has been persistent and consistent with his hx of gout. No fevers  Exam:  Gen- AAO*3  Cardio- RRR, SEM +  Lungs- CTA b/l  Ext- improving LE edema, tenderness over toes of R foot  Abd- soft, non tender, BS +   Assessment and Plan:  48 y/o man with cardiorenal syndrome now improving on milrinone and diuretics   Cardiorenal syndrome:   - Pt is negative 1.5 litres over the last 24 hrs but CVP and weight have increased today - Cardio managing HF regimen. Restarted on Lasix gtt today - c/w milrinone gtt for now. C/w metolazone - c/w hydralazine, imdur  - Pt to remain on milrinone through the weekend per cardio   Hyponatremia:  - Will monitor  - Likely hypervolemic hyponatremia. Na stable. Will hold off on further tolvaptan for now   DM:  - Would c/w current regimen and monitor. BS stable - Will likely need increased premeal when started on prednisone  Gout:  - Pt with gouty flare of R foot. Agreeable to prednisone today - Will monitor BS and increase insulin as necessary for steroid mediated hyperglycemia

## 2013-09-28 NOTE — Progress Notes (Signed)
Inpatient RN visit- [facility] 2H Room 23-[facility] RN Visit-[name] RN Related admission to [facility] diagnosis of Hypertrophic Cardiomyopathy.  Pt is Full Code.  Pt seen lying in bed, appeared to be sound asleep, did not awaken to voice. Per chart review pt had recvd PRN ativan 1mg  and 3 doses of PRN pain medication since 3 am. He remains on the IV milrinone gtt and the lasix gtt has been restarted r/t a CVP of 24 and a weight gain of 1 lb. No plans for discharge at this time. [facility] will continue to follow.  Patient's home medication list and transfer summary in place  on shadow chart.   Please call [facility] @ [phone]- ask for RN Liaison or after hours,ask for on-call RN with any hospice needs.   Thank you. Hansel Starling, RN  [facility]  Hospice Liaison  [phone])

## 2013-09-28 NOTE — Progress Notes (Signed)
Refused to allow NT to obtain vital signs.

## 2013-09-28 NOTE — Progress Notes (Signed)
Advanced Heart Failure Rounding Note   Subjective:    Mr. Kolesnik is a 48y/o male with HTN, DM2, polysunbstance abuse with longstanding HF due to NICM (probably ETOH), EF 10%. Current cocaine abuse. Was previously on [facility].   Admitted with ab pain & n/v/diarrhea. Weight up to 231.   Noncontrast CT ab: Diverticulosis of the colon with mild thickening of the sigmoid colon suggesting early diverticulitis. No abscess. Hepatic cirrhosis with mild ascites and edema throughout the subcutaneous soft tissues. Renal function stable.   Foot pain improved. Drinking > 2 liters. Eating italian ice.  Milrinone remains at 0.125 yesterday. Weight up a pound.216>217 .  Co-ox remains 64%.   Cr slightly worse 1.9->2.0>1.98>2.1>2.18>2.09   Denies SOB/CP    Objective:   Weight Range:  Vital Signs:   Temp:  [97.5 F (36.4 C)-98.7 F (37.1 C)] 98.7 F (37.1 C) (09/11 0800) Pulse Rate:  [84-104] 84 (09/11 0800) Resp:  [20-22] 20 (09/11 0800) BP: (97-126)/(60-85) 97/69 mmHg (09/11 0800) SpO2:  [96 %-98 %] 96 % (09/11 0800) Weight:  [217 lb (98.431 kg)] 217 lb (98.431 kg) (09/11 0300) Last BM Date: 09/24/13  Weight change: Filed Weights   09/26/13 0300 09/27/13 0346 09/28/13 0300  Weight: 217 lb 13 oz (98.8 kg) 216 lb (97.977 kg) 217 lb (98.431 kg)    Intake/Output:   Intake/Output Summary (Last 24 hours) at 09/28/13 0824 Last data filed at 09/28/13 0700  Gross per 24 hour  Intake  934.7 ml  Output   2850 ml  Net -1915.3 ml     Physical Exam: CVP 24   General: Chronically ill appearing. No resp difficulty; Lying in bed.   HEENT: normal  Neck: supple. RIJ central line. JVP to jaw . Carotids 2+ bilaterally; no bruits. No lymphadenopathy or thryomegaly appreciated.  Cor: PMI normal. Tachy & regular rhythm. 2/6 systolic murmur LLSB. + S3  Lungs: clear  Abdomen: obese, soft, nontender, non distended. No hepatosplenomegaly. No bruits or masses. Good bowel sounds.  Extremities: no  cyanosis, clubbing, rash. R and LLE 1+ edema. No edema; both feet and 1st MTP sore to touch  Neuro: alert & orientedx3, cranial nerves grossly intact. Moves all 4 extremities w/o difficulty. Affect pleasant.when   Telemetry: ST PVCs 100s  Labs: Basic Metabolic Panel:  Recent Labs Lab 09/24/13 0508 09/25/13 0400 09/26/13 0309 09/27/13 0400 09/28/13 0500  NA 127* 126* 126* 128* 129*  K 3.8 3.8 3.1* 3.5* 3.3*  CL 79* 78* 79* 81* 80*  CO2 36* 35* 35* 35* 35*  GLUCOSE 201* 171* 173* 116* 154*  BUN 73* 78* 79* 85* 80*  CREATININE 2.03* 1.98* 2.12* 2.18* 2.09*  CALCIUM 8.5 9.0 9.0 8.9 9.1  MG  --   --   --   --  2.4    Liver Function Tests: No results found for this basename: AST, ALT, ALKPHOS, BILITOT, PROT, ALBUMIN,  in the last 168 hours No results found for this basename: LIPASE, AMYLASE,  in the last 168 hours No results found for this basename: AMMONIA,  in the last 168 hours  CBC: No results found for this basename: WBC, NEUTROABS, HGB, HCT, MCV, PLT,  in the last 168 hours  Cardiac Enzymes: No results found for this basename: CKTOTAL, CKMB, CKMBINDEX, TROPONINI,  in the last 168 hours  BNP: BNP (last 3 results)  Recent Labs  07/12/13 1344 09/08/13 2224 09/14/13 1203  PROBNP 5773.0* 6632.0* 10023*     Other results:  EKG:   Imaging:  No results found.   Medications:     Scheduled Medications: . allopurinol  100 mg Oral Daily  . aspirin EC  81 mg Oral Daily  . atorvastatin  80 mg Oral q1800  . famotidine  20 mg Oral Daily  . furosemide  80 mg Intravenous TID  . gabapentin  200 mg Oral TID  . heparin  5,000 Units Subcutaneous 3 times per day  . hydrALAZINE  50 mg Oral TID  . insulin aspart  0-20 Units Subcutaneous TID WC  . insulin aspart  0-5 Units Subcutaneous QHS  . insulin aspart  4 Units Subcutaneous TID WC  . insulin glargine  20 Units Subcutaneous QHS  . isosorbide mononitrate  30 mg Oral Daily  . metolazone  5 mg Oral Q breakfast  .  sodium chloride  3 mL Intravenous Q12H    Infusions: . milrinone 0.125 mcg/kg/min (09/27/13 0744)    PRN Medications: sodium chloride, albuterol, loperamide, LORazepam, morphine injection, ondansetron (ZOFRAN) IV, oxyCODONE, senna, sodium chloride   Assessment:   1. A/c systolic HF with low output physiology  --d/c weight in 6/15 was 206 pounds  2. NICM EF 10-15%  3. A/c renal failure, stage IV (baseline Cr ~2.0-2.5)  4. Hyperkalemia  5. NSVT  6. N/V/diarrhea -  7. H/o polysubstance abuse  8. Noncompliance  9. Hyponatremia/Hypokalemia 10. Acute gout 11. NSVT 12. Hyokalemia  Plan/Discussion:   Volume status continues to go up. CVP 24. Weight up another pound. Stop intermittent IV lasix and start lasix drip at 15 mg per hour. Continue  metolazone.Continue milrinone at 0.125 mcg. Supplement K.  Again stress the importance of fluid restrictions.   Continue hydralazine 50 mg tid and imdur 30 mg daily. He is not ace due to CKD. No BB due to low output.Not a candidate for home milrinone due to noncompliance and drug abuse.   Continue to ambulate with cardiac rehab. Add ted hose.   [facility] following.    CLEGG,AMY, NP-C  8:24 AM  Patient seen and examined with Tonye Becket, NP. We discussed all aspects of the encounter. I agree with the assessment and plan as stated above.   We are in a repeating pattern with him. We continue diureses him and he continues to increase his po intake. Agree with increasing diuretics. We AGAIN stressed to him the importance of fluid restriction. I am not sure if we can affect his behavior. Will keep through the weekend and hopefully we can get to a point where we can send home next week.   Daniel Bensimhon,MD 10:44 AM

## 2013-09-28 NOTE — Progress Notes (Signed)
Refused ambulation

## 2013-09-28 NOTE — Progress Notes (Signed)
Pt walked two laps around unit. Denied SOB or CP. Pt mentioned knees felt better after walking, but his feet were killing him. Pt returned to room. BP 121/67, HR 110, O2 100% RA, RR 18

## 2013-09-29 LAB — CARBOXYHEMOGLOBIN
CARBOXYHEMOGLOBIN: 1.5 % (ref 0.5–1.5)
Methemoglobin: 0.5 % (ref 0.0–1.5)
O2 Saturation: 63.9 %
TOTAL HEMOGLOBIN: 14.2 g/dL (ref 13.5–18.0)

## 2013-09-29 LAB — BASIC METABOLIC PANEL
Anion gap: 13 (ref 5–15)
BUN: 80 mg/dL — AB (ref 6–23)
CALCIUM: 9.3 mg/dL (ref 8.4–10.5)
CHLORIDE: 84 meq/L — AB (ref 96–112)
CO2: 33 mEq/L — ABNORMAL HIGH (ref 19–32)
Creatinine, Ser: 2.11 mg/dL — ABNORMAL HIGH (ref 0.50–1.35)
GFR calc Af Amer: 42 mL/min — ABNORMAL LOW (ref >90)
GFR calc non Af Amer: 36 mL/min — ABNORMAL LOW (ref >90)
Glucose, Bld: 253 mg/dL — ABNORMAL HIGH (ref 70–99)
Potassium: 4 mEq/L (ref 3.7–5.3)
Sodium: 130 mEq/L — ABNORMAL LOW (ref 137–147)

## 2013-09-29 LAB — GLUCOSE, CAPILLARY
GLUCOSE-CAPILLARY: 124 mg/dL — AB (ref 70–99)
Glucose-Capillary: 220 mg/dL — ABNORMAL HIGH (ref 70–99)
Glucose-Capillary: 331 mg/dL — ABNORMAL HIGH (ref 70–99)
Glucose-Capillary: 425 mg/dL — ABNORMAL HIGH (ref 70–99)

## 2013-09-29 LAB — CBC
HCT: 42.2 % (ref 39.0–52.0)
HEMOGLOBIN: 13.9 g/dL (ref 13.0–17.0)
MCH: 27.7 pg (ref 26.0–34.0)
MCHC: 32.9 g/dL (ref 30.0–36.0)
MCV: 84.1 fL (ref 78.0–100.0)
Platelets: 209 10*3/uL (ref 150–400)
RBC: 5.02 MIL/uL (ref 4.22–5.81)
RDW: 16.9 % — AB (ref 11.5–15.5)
WBC: 4.7 10*3/uL (ref 4.0–10.5)

## 2013-09-29 LAB — URIC ACID: Uric Acid, Serum: 11.7 mg/dL — ABNORMAL HIGH (ref 4.0–7.8)

## 2013-09-29 MED ORDER — INSULIN ASPART 100 UNIT/ML ~~LOC~~ SOLN
5.0000 [IU] | Freq: Three times a day (TID) | SUBCUTANEOUS | Status: DC
  Administered 2013-09-29 – 2013-09-30 (×3): 5 [IU] via SUBCUTANEOUS

## 2013-09-29 MED ORDER — METOLAZONE 5 MG PO TABS
5.0000 mg | ORAL_TABLET | Freq: Two times a day (BID) | ORAL | Status: DC
  Administered 2013-09-29 – 2013-10-08 (×18): 5 mg via ORAL
  Filled 2013-09-29 (×20): qty 1

## 2013-09-29 NOTE — Progress Notes (Signed)
GIP visit- [facility] [id number] - Ulis Rias RN  Related admission to [facility] diagnosis of Hypertrophic Cardiomyopathy.  Pt is Full Code. Patient sitting on the side of the bed upset about his meal tray and said they keep sending him up cold food. Asked patient if there was anything hospice could do for him and he said tell Trucia to get him a fruit basket, Chanda notified. Patient states that his head hurts, and his feet hurt and these people are making him mad. No acute distress noted, dietary arrived with a new tray of food with a hot hamburger.   Please call [facility] @ [phone]- ask for RN Liaison or after hours,ask for on-call RN with any hospice needs.  Thank you. Ulis Rias RN

## 2013-09-29 NOTE — Progress Notes (Signed)
Tech attempted to get B/P Pt would not allow the B/P cuff to stay on arm long enough to get pressure.

## 2013-09-29 NOTE — Progress Notes (Signed)
Subjective:  No overnight events. Pt doing ok this morning, c/o continued bilateral foot pain but is not letting his feet be examined; pt agreed to restart Prednisone for gout treatment as long as we increased his insulin to cover for the hyperglycemia 2/2 steroids. Weight stable after Lasix restarted, -2.3L yesterday. CVP 17.  Objective: Vital signs in last 24 hours: Filed Vitals:   09/28/13 2307 09/29/13 0400 09/29/13 0729 09/29/13 0900  BP: 121/67 115/68  106/73  Pulse: 112 108 95   Temp: 98.3 F (36.8 C) 98.6 F (37 C) 98.3 F (36.8 C)   TempSrc: Oral Oral Oral   Resp: 18 16 18    Height:      Weight:  217 lb 13 oz (98.8 kg)    SpO2: 100% 100%     Weight change: 13 oz (0.369 kg)  Intake/Output Summary (Last 24 hours) at 09/29/13 1058 Last data filed at 09/29/13 0900  Gross per 24 hour  Intake 1539.7 ml  Output   4600 ml  Net -3060.3 ml   Physical Exam General: Sitting on the side of the bed eating breakfast, NAD HEENT: [place]/AT, EOMI, mucus membranes moist CV: Tachycardic, 2/6 systolic ejection murmur Pulm: CTA bilaterally, no wheezes Msk: 2+ pitting edema in lower extremities, bilateral feet, esp at 1st MTP very TTP Neuro: alert and oriented x 3, CNs II-XII intact, moves all 4 extremities  Lab Results: Basic Metabolic Panel:  Recent Labs Lab 09/28/13 0500 09/29/13 0429  NA 129* 130*  K 3.3* 4.0  CL 80* 84*  CO2 35* 33*  GLUCOSE 154* 253*  BUN 80* 80*  CREATININE 2.09* 2.11*  CALCIUM 9.1 9.3  MG 2.4  --    Liver Function Tests: No results found for this basename: AST, ALT, ALKPHOS, BILITOT, PROT, ALBUMIN,  in the last 168 hours No results found for this basename: LIPASE, AMYLASE,  in the last 168 hours No results found for this basename: AMMONIA,  in the last 168 hours CBC:  Recent Labs Lab 09/29/13 0429  WBC 4.7  HGB 13.9  HCT 42.2  MCV 84.1  PLT 209   CBG:  Recent Labs Lab 09/27/13 2121 09/28/13 0809 09/28/13 1127 09/28/13 1709  09/28/13 2144 09/29/13 0732  GLUCAP 255* 152* 199* 172* 137* 220*   Urine Drug Screen: Drugs of Abuse     Component Value Date/Time   LABOPIA NONE DETECTED 09/14/2013 2359   LABOPIA NEG 02/01/2012 1356   COCAINSCRNUR NONE DETECTED 09/14/2013 2359   COCAINSCRNUR PPS 02/01/2012 1356   LABBENZ NONE DETECTED 09/14/2013 2359   LABBENZ PPS 02/01/2012 1356   LABBENZ NEG 07/09/2010 1522   AMPHETMU NONE DETECTED 09/14/2013 2359   AMPHETMU NEG 07/09/2010 1522   THCU NONE DETECTED 09/14/2013 2359   LABBARB NONE DETECTED 09/14/2013 2359   LABBARB NEG 02/01/2012 1356      Micro Results: No results found for this or any previous visit (from the past 240 hour(s)).  Studies/Results: No results found.  Medications: I have reviewed the patient's current medications. Scheduled Meds: . allopurinol  100 mg Oral Daily  . aspirin EC  81 mg Oral Daily  . atorvastatin  80 mg Oral q1800  . bacitracin-neomycin-polymyxin-hydrocortisone   Topical BID  . famotidine  20 mg Oral Daily  . gabapentin  200 mg Oral TID  . heparin  5,000 Units Subcutaneous 3 times per day  . hydrALAZINE  50 mg Oral TID  . insulin aspart  0-20 Units Subcutaneous TID WC  . insulin  aspart  0-5 Units Subcutaneous QHS  . insulin aspart  5 Units Subcutaneous TID WC  . insulin glargine  20 Units Subcutaneous QHS  . isosorbide mononitrate  30 mg Oral Daily  . metolazone  5 mg Oral Q breakfast  . potassium chloride  40 mEq Oral BID  . predniSONE  40 mg Oral Q breakfast  . sodium chloride  3 mL Intravenous Q12H   Continuous Infusions: . furosemide (LASIX) infusion 15 mg/hr (09/29/13 0300)  . milrinone 0.125 mcg/kg/min (09/28/13 1800)   PRN Meds:.sodium chloride, albuterol, loperamide, LORazepam, morphine injection, ondansetron (ZOFRAN) IV, oxyCODONE, senna, sodium chloride Assessment/Plan: [name] is a 48yo man w/ PMHx of poorly controlled Type 2 DM, CKD Stage 3, chronic systolic non-ischemic cardiomyopathy with AICD (EF 10%) 2/2  polysubstance abuse (alcohol, cocaine) who was admitted for abdominal pain, nausea, vomiting, and diarrhea and cardiorenal syndrome.   Cardiorenal syndrome: CHF complicated by CKD. Weight is 217 lb today, stable. Baseline weight appears to be ~205 lbs. Patient has not been compliant with his diet, not watching fluid intake. Lasix drip was restarted 9/11. He has diuresed 2.3 L in last 24 hours, -31.4 L since admission. Cr 2.11 today. Cr has been stable around 2. Continue to stress importance of restricting fluid intake and avoiding salt.  - Heart Failure team following, appreciate recs  - Continue Hydralazine 50 mg TID  - Continue Metolazone 5 mg daily  - Milrinone per HF team. Not a home Milrinone candidate due to noncompliance and drug abuse (cocaine)  - Lasix drip per HF - Continue Imdur 30 mg daily  - KDur 40 mg po BID - Keep central line to access CVP and CO-Hb  - Avoid beta blocker due to low output  - Avoid ACE- inhibitor due to acute renal failure  - Continue strict I&Os  - Daily weights   Acute gout flare: Elbow pain has improved. Pt now having severe pain in feet due to gout. Pt received 3 days of Prednisone 40 mg, which was completed on 9/1 for elbow pain, now with pain in feet esp at 1st MTPs. Attempted to give Prednisone but pt initially refused due to concerns about increasing blood sugars; agreeable now. Not giving Colchicine 2/2 renal function.  - Checking uric acid level - Continue Allopurinol 100 mg daily, will likely need to increase  - Order Prednisone 40 mg daily x 3 d - Will likely need to further increase insulin dose - Continue Gabapentin 200 mg TID   Type 2 DM: Pt's blood sugars have in 150s-250s. Patient noncompliant with diet.  - Continue Lantus 20 units QHS  - Increasing mealtime Novolog to 5 units TID with meals, and will likely need taking Prednisone  - Continue insulin sliding scale   HTN: BP 90s-100s/60s-70s.  - Continue to monitor  - Continue Hydralazine  50 mg TID  - Continue Lasix drip as above - Continue Metolazone 5 mg daily   Hyponatremia: Patient asymptomatic. Patient's sodium was 125 on 9/1 and he received 1 dose of Tolvaptan 30 mg. Na improved to 130 but began down trending again. Hyponatremia likely 2/2 hypervolemia. Improved today at 130 in the setting of increased diuresis.  - AM BMP   CAD: Continue home atorvastatin 80 mg daily and ASA 81 mg daily.   GERD:  - Continue famotidine    Asthma:  - Continue albuterol 2.5mg  q2h prn for wheezing.   Diet: Heart Healthy/Carb modified   DVT/PE PPx: Heparin SQ   Dispo: Deferred  at this time. Awaiting resolution of medical problems.  The patient does have a current PCP Jonathan Apo, MD) and does need an [facility] hospital follow-up appointment after discharge.  The patient does not have transportation limitations that hinder transportation to clinic appointments.  .Services Needed at time of discharge: Y = Yes, Blank = No PT:   OT:   RN:   Equipment:   Other:     LOS: 15 days   Genelle Gather, MD 09/29/2013, 10:58 AM

## 2013-09-29 NOTE — Progress Notes (Signed)
Advanced Heart Failure Rounding Note   Subjective:    [name] is a 48y/o male with HTN, DM2, polysunbstance abuse with longstanding HF due to NICM (probably ETOH), EF 10%. Current cocaine abuse. Was previously on [facility].   Admitted with ab pain & n/v/diarrhea. Weight up to 231.   Noncontrast CT ab: Diverticulosis of the colon with mild thickening of the sigmoid colon suggesting early diverticulitis. No abscess. Hepatic cirrhosis with mild ascites and edema throughout the subcutaneous soft tissues. Renal function stable.   Lasix gtt restarted yesterday for CVP 24.  Milrinone remains at 0.125. Weight unchanged.  Co-ox remains 64%. Continues to eat and drink all he wants. CVP 19 today  Cr slightly worse 1.9->2.0>1.98>2.1>2.18>2.09> 2.11   Denies SOB/CP    Objective:   Weight Range:  Vital Signs:   Temp:  [97.6 F (36.4 C)-98.6 F (37 C)] 98.3 F (36.8 C) (09/12 0729) Pulse Rate:  [95-112] 95 (09/12 0729) Resp:  [16-20] 18 (09/12 0729) BP: (98-121)/(64-77) 106/73 mmHg (09/12 0900) SpO2:  [98 %-100 %] 100 % (09/12 0400) Weight:  [98.8 kg (217 lb 13 oz)] 98.8 kg (217 lb 13 oz) (09/12 0400) Last BM Date: 09/28/13  Weight change: Filed Weights   09/27/13 0346 09/28/13 0300 09/29/13 0400  Weight: 97.977 kg (216 lb) 98.431 kg (217 lb) 98.8 kg (217 lb 13 oz)    Intake/Output:   Intake/Output Summary (Last 24 hours) at 09/29/13 1216 Last data filed at 09/29/13 1100  Gross per 24 hour  Intake 1075.7 ml  Output   4200 ml  Net -3124.3 ml     Physical Exam: CVP 24   General: Chronically ill appearing. No resp difficulty; Lying in bed.   HEENT: normal  Neck: supple. RIJ central line. JVP to jaw . Carotids 2+ bilaterally; no bruits. No lymphadenopathy or thryomegaly appreciated.  Cor: PMI normal. Tachy & regular rhythm. 2/6 systolic murmur LLSB. + S3  Lungs: clear  Abdomen: obese, soft, nontender, non distended. No hepatosplenomegaly. No bruits or masses. Good bowel  sounds.  Extremities: no cyanosis, clubbing, rash. R and LLE 1+ edema. No edema; both feet and 1st MTP sore to touch  Neuro: alert & orientedx3, cranial nerves grossly intact. Moves all 4 extremities w/o difficulty. Affect pleasant.when   Telemetry: ST PVCs 100s  Labs: Basic Metabolic Panel:  Recent Labs Lab 09/25/13 0400 09/26/13 0309 09/27/13 0400 09/28/13 0500 09/29/13 0429  NA 126* 126* 128* 129* 130*  K 3.8 3.1* 3.5* 3.3* 4.0  CL 78* 79* 81* 80* 84*  CO2 35* 35* 35* 35* 33*  GLUCOSE 171* 173* 116* 154* 253*  BUN 78* 79* 85* 80* 80*  CREATININE 1.98* 2.12* 2.18* 2.09* 2.11*  CALCIUM 9.0 9.0 8.9 9.1 9.3  MG  --   --   --  2.4  --     Liver Function Tests: No results found for this basename: AST, ALT, ALKPHOS, BILITOT, PROT, ALBUMIN,  in the last 168 hours No results found for this basename: LIPASE, AMYLASE,  in the last 168 hours No results found for this basename: AMMONIA,  in the last 168 hours  CBC:  Recent Labs Lab 09/29/13 0429  WBC 4.7  HGB 13.9  HCT 42.2  MCV 84.1  PLT 209    Cardiac Enzymes: No results found for this basename: CKTOTAL, CKMB, CKMBINDEX, TROPONINI,  in the last 168 hours  BNP: BNP (last 3 results)  Recent Labs  07/12/13 1344 09/08/13 2224 09/14/13 1203  PROBNP 5773.0* 6632.0*  16109*     Other results:  EKG:   Imaging: No results found.   Medications:     Scheduled Medications: . allopurinol  100 mg Oral Daily  . aspirin EC  81 mg Oral Daily  . atorvastatin  80 mg Oral q1800  . bacitracin-neomycin-polymyxin-hydrocortisone   Topical BID  . famotidine  20 mg Oral Daily  . gabapentin  200 mg Oral TID  . heparin  5,000 Units Subcutaneous 3 times per day  . hydrALAZINE  50 mg Oral TID  . insulin aspart  0-20 Units Subcutaneous TID WC  . insulin aspart  0-5 Units Subcutaneous QHS  . insulin aspart  5 Units Subcutaneous TID WC  . insulin glargine  20 Units Subcutaneous QHS  . isosorbide mononitrate  30 mg Oral Daily   . metolazone  5 mg Oral Q breakfast  . potassium chloride  40 mEq Oral BID  . predniSONE  40 mg Oral Q breakfast  . sodium chloride  3 mL Intravenous Q12H    Infusions: . furosemide (LASIX) infusion 15 mg/hr (09/29/13 0300)  . milrinone 0.125 mcg/kg/min (09/28/13 1800)    PRN Medications: sodium chloride, albuterol, loperamide, LORazepam, morphine injection, ondansetron (ZOFRAN) IV, oxyCODONE, senna, sodium chloride   Assessment:   1. A/c systolic HF with low output physiology  --d/c weight in 6/15 was 206 pounds  2. NICM EF 10-15%  3. A/c renal failure, stage IV (baseline Cr ~2.0-2.5)  4. Hyperkalemia  5. NSVT  6. N/V/diarrhea -  7. H/o polysubstance abuse  8. Noncompliance  9. Hyponatremia/Hypokalemia 10. Acute gout 11. NSVT 12. Hyokalemia  Plan/Discussion:    Volume status still up. Increase lasix gtt to 20. Continue metolazone  We are in a repeating pattern with him. We continue to diurese him and he continues to increase his po intake.  We AGAIN stressed to him the importance of fluid restriction. I am not sure if we can affect his behavior. Will keep through the weekend and hopefully we can get to a point where we can send home next week.   Valisa Karpel,MD 12:16 PM

## 2013-09-29 NOTE — Progress Notes (Signed)
Patient stated he walked earlier in shift and was not walking again today. Patient reminded that it is important to walk and to sit up in a chair as much as possible. Will continue to follow patient Monday.

## 2013-09-29 NOTE — Progress Notes (Signed)
Offered Pt. A bath, he refused. Tech gave Pt bath supplies and instructed Pt to call if he needed assistance.

## 2013-09-30 LAB — BASIC METABOLIC PANEL
Anion gap: 15 (ref 5–15)
BUN: 83 mg/dL — AB (ref 6–23)
CO2: 31 mEq/L (ref 19–32)
CREATININE: 2.06 mg/dL — AB (ref 0.50–1.35)
Calcium: 9.5 mg/dL (ref 8.4–10.5)
Chloride: 82 mEq/L — ABNORMAL LOW (ref 96–112)
GFR calc non Af Amer: 37 mL/min — ABNORMAL LOW (ref >90)
GFR, EST AFRICAN AMERICAN: 43 mL/min — AB (ref >90)
Glucose, Bld: 292 mg/dL — ABNORMAL HIGH (ref 70–99)
Potassium: 4 mEq/L (ref 3.7–5.3)
Sodium: 128 mEq/L — ABNORMAL LOW (ref 137–147)

## 2013-09-30 LAB — GLUCOSE, CAPILLARY
GLUCOSE-CAPILLARY: 365 mg/dL — AB (ref 70–99)
Glucose-Capillary: 314 mg/dL — ABNORMAL HIGH (ref 70–99)
Glucose-Capillary: 282 mg/dL — ABNORMAL HIGH (ref 70–99)
Glucose-Capillary: 310 mg/dL — ABNORMAL HIGH (ref 70–99)
Glucose-Capillary: 191 mg/dL — ABNORMAL HIGH (ref 70–99)

## 2013-09-30 LAB — CARBOXYHEMOGLOBIN
Carboxyhemoglobin: 1.4 % (ref 0.5–1.5)
Methemoglobin: 0.6 % (ref 0.0–1.5)
O2 SAT: 58 %
Total hemoglobin: 14.1 g/dL (ref 13.5–18.0)

## 2013-09-30 MED ORDER — INSULIN ASPART 100 UNIT/ML ~~LOC~~ SOLN
7.0000 [IU] | Freq: Three times a day (TID) | SUBCUTANEOUS | Status: DC
  Administered 2013-09-30 – 2013-10-05 (×14): 7 [IU] via SUBCUTANEOUS

## 2013-09-30 NOTE — Progress Notes (Signed)
Advanced Heart Failure Rounding Note   Subjective:    Mr. Toye is a 48y/o male with HTN, DM2, polysunbstance abuse with longstanding HF due to NICM (probably ETOH), EF 10%. Current cocaine abuse. Was previously on [facility].   Admitted with ab pain & n/v/diarrhea. Weight up to 231.   Noncontrast CT ab: Diverticulosis of the colon with mild thickening of the sigmoid colon suggesting early diverticulitis. No abscess. Hepatic cirrhosis with mild ascites and edema throughout the subcutaneous soft tissues. Renal function stable.   Lasix gtt increased to 20/hr.  Milrinone remains at 0.125. Last night ate food from [employer]. Weight up a pound  Co-ox remains 64%.  CVP 17- 19 today  Cr  1.9->2.0>1.98>2.1>2.18>2.09> 2.11> 2.06  Denies SOB/CP    Objective:   Weight Range:  Vital Signs:   Temp:  [97.3 F (36.3 C)-98.3 F (36.8 C)] 98.2 F (36.8 C) (09/13 1132) Pulse Rate:  [90-110] 97 (09/13 1132) Resp:  [17-20] 17 (09/13 0308) BP: (96-128)/(55-83) 108/64 mmHg (09/13 1132) SpO2:  [96 %-100 %] 98 % (09/13 1132) Weight:  [99 kg (218 lb 4.1 oz)] 99 kg (218 lb 4.1 oz) (09/13 0308) Last BM Date: 09/28/13  Weight change: Filed Weights   09/28/13 0300 09/29/13 0400 09/30/13 0308  Weight: 98.431 kg (217 lb) 98.8 kg (217 lb 13 oz) 99 kg (218 lb 4.1 oz)    Intake/Output:   Intake/Output Summary (Last 24 hours) at 09/30/13 1317 Last data filed at 09/30/13 1100  Gross per 24 hour  Intake 1690.85 ml  Output   4175 ml  Net -2484.15 ml     Physical Exam: CVP 24   General: Chronically ill appearing. No resp difficulty; Lying in bed.   HEENT: normal  Neck: supple. RIJ central line. JVP to jaw . Carotids 2+ bilaterally; no bruits. No lymphadenopathy or thryomegaly appreciated.  Cor: PMI normal. Tachy & regular rhythm. 2/6 systolic murmur LLSB. + S3  Lungs: clear  Abdomen: obese, soft, nontender, non distended. No hepatosplenomegaly. No bruits or masses. Good bowel sounds.   Extremities: no cyanosis, clubbing, rash. R and LLE 1+ edema. No edema; both feet and 1st MTP sore to touch  Neuro: alert & orientedx3, cranial nerves grossly intact. Moves all 4 extremities w/o difficulty. Affect pleasant.when   Telemetry: ST PVCs 100s  Labs: Basic Metabolic Panel:  Recent Labs Lab 09/26/13 0309 09/27/13 0400 09/28/13 0500 09/29/13 0429 09/30/13 0337  NA 126* 128* 129* 130* 128*  K 3.1* 3.5* 3.3* 4.0 4.0  CL 79* 81* 80* 84* 82*  CO2 35* 35* 35* 33* 31  GLUCOSE 173* 116* 154* 253* 292*  BUN 79* 85* 80* 80* 83*  CREATININE 2.12* 2.18* 2.09* 2.11* 2.06*  CALCIUM 9.0 8.9 9.1 9.3 9.5  MG  --   --  2.4  --   --     Liver Function Tests: No results found for this basename: AST, ALT, ALKPHOS, BILITOT, PROT, ALBUMIN,  in the last 168 hours No results found for this basename: LIPASE, AMYLASE,  in the last 168 hours No results found for this basename: AMMONIA,  in the last 168 hours  CBC:  Recent Labs Lab 09/29/13 0429  WBC 4.7  HGB 13.9  HCT 42.2  MCV 84.1  PLT 209    Cardiac Enzymes: No results found for this basename: CKTOTAL, CKMB, CKMBINDEX, TROPONINI,  in the last 168 hours  BNP: BNP (last 3 results)  Recent Labs  07/12/13 1344 09/08/13 2224 09/14/13 1203  PROBNP 5773.0*  6632.0* 10023*     Other results:  EKG:   Imaging: No results found.   Medications:     Scheduled Medications: . allopurinol  100 mg Oral Daily  . aspirin EC  81 mg Oral Daily  . atorvastatin  80 mg Oral q1800  . bacitracin-neomycin-polymyxin-hydrocortisone   Topical BID  . famotidine  20 mg Oral Daily  . gabapentin  200 mg Oral TID  . heparin  5,000 Units Subcutaneous 3 times per day  . hydrALAZINE  50 mg Oral TID  . insulin aspart  0-20 Units Subcutaneous TID WC  . insulin aspart  0-5 Units Subcutaneous QHS  . insulin aspart  7 Units Subcutaneous TID WC  . insulin glargine  20 Units Subcutaneous QHS  . isosorbide mononitrate  30 mg Oral Daily  .  metolazone  5 mg Oral BID  . potassium chloride  40 mEq Oral BID  . predniSONE  40 mg Oral Q breakfast  . sodium chloride  3 mL Intravenous Q12H    Infusions: . furosemide (LASIX) infusion 20 mg/hr (09/30/13 0443)  . milrinone 0.125 mcg/kg/min (09/29/13 1900)    PRN Medications: sodium chloride, albuterol, loperamide, LORazepam, morphine injection, ondansetron (ZOFRAN) IV, oxyCODONE, senna, sodium chloride   Assessment:   1. A/c systolic HF with low output physiology  --d/c weight in 6/15 was 206 pounds  2. NICM EF 10-15%  3. A/c renal failure, stage IV (baseline Cr ~2.0-2.5)  4. Hyperkalemia  5. NSVT  6. N/V/diarrhea -  7. H/o polysubstance abuse  8. Noncompliance  9. Hyponatremia/Hypokalemia 10. Acute gout 11. NSVT 12. Hyokalemia  Plan/Discussion:    Volume status still up despite high-dose lasix gtt.   We are in a repeating pattern with him. We continue to diurese him and he continues to increase his po intake. I had it out with him today and told him that I am signing off his care until he changes his behavior. Would continue high-dose lasix and metoalzone and hopefully we can get to a point where we can send home this week.   Trinisha Paget,MD 1:17 PM

## 2013-09-30 NOTE — Progress Notes (Signed)
GIP visit [name] [facility] 5Q49 [facility] [name] RN  Related Admission to [facility] diagnosis of Hypertrophic Cardiomyopathy. Pt is Full Code. Patient resting comfortable on visit, nurse had no needs at this time.   Please call [facility] @ [phone]- ask for RN Liaison or after hours,ask for on-call RN with any hospice needs.  Thank you. Ulis Rias RN

## 2013-09-30 NOTE — Progress Notes (Signed)
Pt ambulated 1 lap around unit independently. Tolerated well. Barbera Setters RN

## 2013-09-30 NOTE — Progress Notes (Signed)
Subjective: No overnight events. Patient very frustrated and rude this morning. He continues to complain about the amount of food he is getting here in the hospital and his diet restrictions. He states his friends have been dropping off food from [place] and [employer] because he is so hungry. I reiterated the importance of avoiding salt and that fast food has a lot of sodium. Patient also complaining that his feet hurt and that he has been constipated because he is not eating enough food.   Objective: Vital signs in last 24 hours: Filed Vitals:   09/29/13 1633 09/29/13 1941 09/29/13 2316 09/30/13 0308  BP: 96/55 116/75 112/70 128/71  Pulse: 102 90 92 110  Temp: 98.2 F (36.8 C) 98.3 F (36.8 C) 97.3 F (36.3 C) 97.8 F (36.6 C)  TempSrc: Oral Oral Oral Oral  Resp: Height:      Weight:    218 lb 4.1 oz (99 kg)  SpO2:  96% 100% 96%   Weight change: 7.1 oz (0.2 kg)  Intake/Output Summary (Last 24 hours) at 09/30/13 0810 Last data filed at 09/30/13 0600  Gross per 24 hour  Intake 1639.95 ml  Output   4275 ml  Net -2635.05 ml   General: agitated, sitting up in bed HEENT: [place]/AT, EOMI, sclera anicteric CV: tachycardic, 2/6 systolic murmur Pulm: CTA bilaterally, breaths non-labored Ext: warm, moves all, 2+ pitting edema in lower extremities  Lab Results: Basic Metabolic Panel:  Recent Labs Lab 09/28/13 0500 09/29/13 0429 09/30/13 0337  NA 129* 130* 128*  K 3.3* 4.0 4.0  CL 80* 84* 82*  CO2 35* 33* 31  GLUCOSE 154* 253* 292*  BUN 80* 80* 83*  CREATININE 2.09* 2.11* 2.06*  CALCIUM 9.1 9.3 9.5  MG 2.4  --   --    Liver Function Tests: No results found for this basename: AST, ALT, ALKPHOS, BILITOT, PROT, ALBUMIN,  in the last 168 hours No results found for this basename: LIPASE, AMYLASE,  in the last 168 hours No results found for this basename: AMMONIA,  in the last 168 hours CBC:  Recent Labs Lab 09/29/13 0429  WBC 4.7  HGB 13.9  HCT 42.2  MCV  84.1  PLT 209   Cardiac Enzymes: No results found for this basename: CKTOTAL, CKMB, CKMBINDEX, TROPONINI,  in the last 168 hours BNP: No results found for this basename: PROBNP,  in the last 168 hours D-Dimer: No results found for this basename: DDIMER,  in the last 168 hours CBG:  Recent Labs Lab 09/28/13 2144 09/29/13 0732 09/29/13 1247 09/29/13 1631 09/29/13 2125 09/30/13 0321  GLUCAP 137* 220* 124* 331* 425* 314*   Hemoglobin A1C: No results found for this basename: HGBA1C,  in the last 168 hours Fasting Lipid Panel: No results found for this basename: CHOL, HDL, LDLCALC, TRIG, CHOLHDL, LDLDIRECT,  in the last 168 hours Thyroid Function Tests: No results found for this basename: TSH, T4TOTAL, FREET4, T3FREE, THYROIDAB,  in the last 168 hours Coagulation: No results found for this basename: LABPROT, INR,  in the last 168 hours Anemia Panel: No results found for this basename: VITAMINB12, FOLATE, FERRITIN, TIBC, IRON, RETICCTPCT,  in the last 168 hours Urine Drug Screen: Drugs of Abuse     Component Value Date/Time   LABOPIA NONE DETECTED 09/14/2013 2359   LABOPIA NEG 02/01/2012 1356   COCAINSCRNUR NONE DETECTED 09/14/2013 2359   COCAINSCRNUR PPS 02/01/2012 1356   LABBENZ NONE DETECTED 09/14/2013 2359   LABBENZ  PPS 02/01/2012 1356   LABBENZ NEG 07/09/2010 1522   AMPHETMU NONE DETECTED 09/14/2013 2359   AMPHETMU NEG 07/09/2010 1522   THCU NONE DETECTED 09/14/2013 2359   LABBARB NONE DETECTED 09/14/2013 2359   LABBARB NEG 02/01/2012 1356    Alcohol Level: No results found for this basename: ETH,  in the last 168 hours Urinalysis: No results found for this basename: COLORURINE, APPERANCEUR, LABSPEC, PHURINE, GLUCOSEU, HGBUR, BILIRUBINUR, KETONESUR, PROTEINUR, UROBILINOGEN, NITRITE, LEUKOCYTESUR,  in the last 168 hours   Micro Results: No results found for this or any previous visit (from the past 240 hour(s)). Studies/Results: No results found. Medications: I have  reviewed the patient's current medications. Scheduled Meds: . allopurinol  100 mg Oral Daily  . aspirin EC  81 mg Oral Daily  . atorvastatin  80 mg Oral q1800  . bacitracin-neomycin-polymyxin-hydrocortisone   Topical BID  . famotidine  20 mg Oral Daily  . gabapentin  200 mg Oral TID  . heparin  5,000 Units Subcutaneous 3 times per day  . hydrALAZINE  50 mg Oral TID  . insulin aspart  0-20 Units Subcutaneous TID WC  . insulin aspart  0-5 Units Subcutaneous QHS  . insulin aspart  5 Units Subcutaneous TID WC  . insulin glargine  20 Units Subcutaneous QHS  . isosorbide mononitrate  30 mg Oral Daily  . metolazone  5 mg Oral BID  . potassium chloride  40 mEq Oral BID  . predniSONE  40 mg Oral Q breakfast  . sodium chloride  3 mL Intravenous Q12H   Continuous Infusions: . furosemide (LASIX) infusion 20 mg/hr (09/30/13 0443)  . milrinone 0.125 mcg/kg/min (09/29/13 1900)   PRN Meds:.sodium chloride, albuterol, loperamide, LORazepam, morphine injection, ondansetron (ZOFRAN) IV, oxyCODONE, senna, sodium chloride Assessment/Plan: [name] is a 48yo man w/ PMHx of poorly controlled Type 2 DM, CKD Stage 3, chronic systolic non-ischemic cardiomyopathy with AICD (EF 10%) 2/2 polysubstance abuse (alcohol, cocaine) who was admitted for abdominal pain, nausea, vomiting, and diarrhea and cardiorenal syndrome.   1. Cardiorenal syndrome: CHF complicated by CKD. Weight is 218 lb today, stable. Baseline weight appears to be ~205 lbs. Patient has not been compliant with his diet, not watching fluid intake. Lasix drip was restarted on 9/11, increased to 20 mg yesterday. He has diuresed 4.2 L in last 24 hours, -34.0 L since admission. Cr 2.06 today. Cr has been stable around 2. Continue to stress importance of restricting fluid intake and avoiding salt.  - Heart Failure team following, appreciate recs  - Continue Hydralazine 50 mg TID  - Continue Metolazone 5 mg BID - Milrinone per HF team. Not a home  Milrinone candidate due to noncompliance and drug abuse (cocaine)  - Lasix drip per HF, increased to 20 mg - Continue Imdur 30 mg daily  - KDur 40 mg po BID  - Keep central line to access CVP and CO-Hb  - Avoid beta blocker due to low output  - Avoid ACE- inhibitor due to acute renal failure  - Continue strict I&Os  - Daily weights   2. Acute gout flare: Elbow pain has improved. Pt now having severe pain in feet due to gout. Pt received 3 days of Prednisone 40 mg, which was completed on 9/1 for elbow pain, now with pain in feet esp at 1st MTPs. Attempted to give Prednisone but pt initially refused due to concerns about increasing blood sugars; agreeable now. Not giving Colchicine 2/2 renal function. Uric acid level elevated at 11.7. -  Continue Allopurinol 100 mg daily, will likely need to increase  - Continue Prednisone 40 mg daily x 3 d (started 9/12) - Will likely need to further increase insulin dose --> blood sugars in 200s-400s - Continue Gabapentin 200 mg TID   3. Type 2 DM: Pt's blood sugars have in 120s-400s. Patient noncompliant with diet and now getting Prednisone for gout flare.  - Continue Lantus 20 units QHS  - Continue Novolog to 5 units TID with meals, may need to increase if blood sugars continue to be elevated - Continue insulin sliding scale   4. HTN: BP 90s-120s/60s-70s.  - Continue to monitor  - Continue Hydralazine 50 mg TID  - Continue Lasix drip as above  - Continue Metolazone 5 mg daily   5. Hyponatremia: Patient asymptomatic. Patient's sodium was 125 on 9/1 and he received 1 dose of Tolvaptan 30 mg. Na improved to 130 but began down trending again. Hyponatremia likely 2/2 hypervolemia. Improved to 130, now 128 today. Continue to diurese. - AM BMP   6. CAD: Continue home atorvastatin 80 mg daily and ASA 81 mg daily.   7.GERD:  - Continue famotidine 20mg    8. Asthma:  - Continue albuterol 2.5mg  q2h prn for wheezing.   Diet: Heart Healthy/Carb modified    DVT/PE PPx: Heparin SQ  Dispo: Deferred at this time. Awaiting resolution of medical problems.   The patient does have a current PCP Baltazar Apo, MD) and does need an [facility] hospital follow-up appointment after discharge.  The patient does not have transportation limitations that hinder transportation to clinic appointments.  .Services Needed at time of discharge: Y = Yes, Blank = No PT:   OT:   RN:   Equipment:   Other:     LOS: 16 days   Rich Number, MD 09/30/2013, 8:10 AM

## 2013-09-30 NOTE — Progress Notes (Signed)
Pt ambulated 1 lap around unit  Independently. Tolerated well. Barbera Setters RN

## 2013-10-01 LAB — CARBOXYHEMOGLOBIN
CARBOXYHEMOGLOBIN: 1.3 % (ref 0.5–1.5)
METHEMOGLOBIN: 0.6 % (ref 0.0–1.5)
O2 Saturation: 63.7 %
Total hemoglobin: 14.2 g/dL (ref 13.5–18.0)

## 2013-10-01 LAB — GLUCOSE, CAPILLARY
GLUCOSE-CAPILLARY: 155 mg/dL — AB (ref 70–99)
GLUCOSE-CAPILLARY: 161 mg/dL — AB (ref 70–99)
GLUCOSE-CAPILLARY: 328 mg/dL — AB (ref 70–99)
Glucose-Capillary: 235 mg/dL — ABNORMAL HIGH (ref 70–99)

## 2013-10-01 LAB — BASIC METABOLIC PANEL
Anion gap: 15 (ref 5–15)
BUN: 87 mg/dL — ABNORMAL HIGH (ref 6–23)
CHLORIDE: 83 meq/L — AB (ref 96–112)
CO2: 32 meq/L (ref 19–32)
Calcium: 9.8 mg/dL (ref 8.4–10.5)
Creatinine, Ser: 1.95 mg/dL — ABNORMAL HIGH (ref 0.50–1.35)
GFR calc non Af Amer: 39 mL/min — ABNORMAL LOW (ref >90)
GFR, EST AFRICAN AMERICAN: 46 mL/min — AB (ref >90)
Glucose, Bld: 174 mg/dL — ABNORMAL HIGH (ref 70–99)
POTASSIUM: 3.1 meq/L — AB (ref 3.7–5.3)
SODIUM: 130 meq/L — AB (ref 137–147)

## 2013-10-01 MED ORDER — POTASSIUM CHLORIDE CRYS ER 20 MEQ PO TBCR
40.0000 meq | EXTENDED_RELEASE_TABLET | Freq: Once | ORAL | Status: AC
  Administered 2013-10-01: 40 meq via ORAL
  Filled 2013-10-01: qty 2

## 2013-10-01 MED ORDER — OXYCODONE HCL 5 MG PO TABS
10.0000 mg | ORAL_TABLET | ORAL | Status: DC | PRN
  Administered 2013-10-01 – 2013-10-08 (×11): 10 mg via ORAL
  Filled 2013-10-01 (×20): qty 2

## 2013-10-01 NOTE — Progress Notes (Signed)
Patient states that he is not eating breakfast, lunch or dinner today because we keep telling him he eats too much.  Will encourage patient to eat and continue to educate on healthy food choices for heart failure.

## 2013-10-01 NOTE — Progress Notes (Signed)
CARE MANAGEMENT NOTE 10/01/2013  Patient:  [name], [name]   Account Number:  [id number]  Date Initiated:  09/17/2013  Documentation initiated by:  Junius Creamer  Subjective/Objective Assessment:   adm w ch pain     Action/Plan:   pcp dr s Marilu Favre, lives w fam  act [facility]   Anticipated DC Date:  10/04/2013   Anticipated DC Plan:  HOME W [facility] CARE  In-house referral  Clinical Social Worker  Hospice / Palliative Care      DC Planning Services  CM consult      [facility] Choice  Resumption Of Svcs/PTA Provider   Choice offered to / List presented to:          [facility] arranged  HH-1 RN  HH-6 SOCIAL WORKER      Status of service:  In process, will continue to follow Medicare Important Message given?  YES (If response is "NO", the following Medicare IM given date fields will be blank) Date Medicare IM given:  09/24/2013 Medicare IM given by:  Junius Creamer Date Additional Medicare IM given:  09/27/2013 Additional Medicare IM given by:  Junius Creamer  Discharge Disposition:  HOME W HOSPICE CARE  Per UR Regulation:  Reviewed for med. necessity/level of care/duration of stay  If discussed at Long Length of Stay Meetings, dates discussed:   09/20/2013  09/25/2013  09/27/2013  10/02/2013    Comments:  [id number]/ZWCHEN Davis,RN,BSN,CCM: patient with complex history of heart failure due to polysubstance and etoh abuse/end stage heart failure and under hospice care/iv lasix and strict i&o for diuresis/pt c/o of continual pain/ abusive to staff and MD/outcome will be home with hospice or resdiental hospice.  9/1  1001 debbie dowell rn,bsn [facility] aware pt in hospital.

## 2013-10-01 NOTE — Progress Notes (Signed)
Pt seen and examined. Please refer to resident note for details  Pt is upset about the exchange he had with Dr. Gala Romney yesterday. C/o pain all over esp in right foot. No fevers/chills  Exam:  Gen- AAO*3  Cardio- mildly tachycardic, SEM +  Lungs- CTA b/l  Ext- improving LE edema  Abd- soft, non tender, BS +   Assessment and Plan:  48 y/o man with cardiorenal syndrome now improving on milrinone and diuretics   Cardiorenal syndrome:  - cardio recommendations appreciated. Milrinone gtt to be dc'd today - C/w lasix gtt per cardio  - c/w milrinone gtt for now. C/w metolazone  - c/w hydralazine, imdur    Hyponatremia:  - Will monitor  - Likely hypervolemic hyponatremia. Na stable. Will hold off on further tolvaptan for now   DM:  - Would c/w current regimen and monitor. BS stable today  Gout:  - d/c prednisone after today's dose  - c/w allopurinol

## 2013-10-01 NOTE — Progress Notes (Signed)
Inpatient RN visit- [facility] 2H Room 23-[facility] RN Visit-[name] RN  Related admission to [facility] diagnosis of Hypertrophic Cardiomyopathy. Pt is Full Code.     Pt seen sitting up at side of bed eating his dinner salad. Pt denied pain, was more concerned about his fall out with Dr. Gala Romney yesterday. Pt reports he is ambulating 2 xs daily and not adding salt to his food. Per chart review and pt report milrinone gtt has been discontinued, he remains on the IV lasix gtt at 20mg /hr. Wt is up 2 lbs at 219. Oxycodone IR has been increased to home dose of 10mg . Pt does report difficulty sleeping when at home and hopes to be discharged with "something to help me sleep at home" he has been recvg ativan 1mg  at bedtime while in the hospital. Emotional support offered. Dr. Gala Romney in at end of writers visit. [facility] will continue to follow.  Patient's home medication list and transfer summary in place on shadow chart.   Please call [facility] @ [phone]- with any hospice needs.   Thank you. Hansel Starling, RN  [facility]  Hospice Liaison  [phone])

## 2013-10-01 NOTE — Progress Notes (Signed)
Subjective: Patient very upset and tearful this morning about his disagreement with Dr. Gala Romney yesterday. Patient is worried that Dr. Gala Romney will not see him today. I expressed to patient that everyone on his health care team has been frustrated with his non-compliance with his diet and his attitude. Patient states that he is making an effort to be nice to everyone. Patient also emotional about home/family issues with his kids having to stay with his sister in [place]. Patient also refusing to eat because he "wants to lose all the weight." He complains of pain "all over" and wants morphine to be given through his central line. I explained to the patient that this is not a safe method for delivery. He is refusing to take oxycodone because it is not his home dose of 10 mg. Will compromise with patient and increase oxycodone to 10 mg Q4H PRN.   Milrinone drip being stopped today.    Objective: Vital signs in last 24 hours: Filed Vitals:   10/01/13 0006 10/01/13 0430 10/01/13 0442 10/01/13 0755  BP:  95/75  97/67  Pulse:  97  59  Temp: 97.6 F (36.4 C)  97.8 F (36.6 C) 97.4 F (36.3 C)  TempSrc: Oral  Oral Oral  Resp:      Height:      Weight:   219 lb 5.7 oz (99.5 kg)   SpO2: 98%  98% 100%   Weight change: 1 lb 1.6 oz (0.5 kg)  Intake/Output Summary (Last 24 hours) at 10/01/13 1135 Last data filed at 10/01/13 1122  Gross per 24 hour  Intake 1583.6 ml  Output   2300 ml  Net -716.4 ml   Physical Exam: General: tearful, emotional, laying in bed HEENT: [place]/AT, EOMI, mucus membranes moist CV: tachycardic, 2/6 systolic murmur Pulm: CTA bilaterally, breaths non-labored Abd: BS+, soft, non-tender Ext: 2+ pitting edema in lower extremities bilaterally  Lab Results: Basic Metabolic Panel:  Recent Labs Lab 09/28/13 0500  09/30/13 0337 10/01/13 0436  NA 129*  < > 128* 130*  K 3.3*  < > 4.0 3.1*  CL 80*  < > 82* 83*  CO2 35*  < > 31 32  GLUCOSE 154*  < > 292* 174*  BUN  80*  < > 83* 87*  CREATININE 2.09*  < > 2.06* 1.95*  CALCIUM 9.1  < > 9.5 9.8  MG 2.4  --   --   --   < > = values in this interval not displayed. Liver Function Tests: No results found for this basename: AST, ALT, ALKPHOS, BILITOT, PROT, ALBUMIN,  in the last 168 hours No results found for this basename: LIPASE, AMYLASE,  in the last 168 hours No results found for this basename: AMMONIA,  in the last 168 hours CBC:  Recent Labs Lab 09/29/13 0429  WBC 4.7  HGB 13.9  HCT 42.2  MCV 84.1  PLT 209   Cardiac Enzymes: No results found for this basename: CKTOTAL, CKMB, CKMBINDEX, TROPONINI,  in the last 168 hours BNP: No results found for this basename: PROBNP,  in the last 168 hours D-Dimer: No results found for this basename: DDIMER,  in the last 168 hours CBG:  Recent Labs Lab 09/30/13 0321 09/30/13 0804 09/30/13 1138 09/30/13 1711 09/30/13 2206 10/01/13 0806  GLUCAP 314* 282* 310* 365* 191* 155*   Hemoglobin A1C: No results found for this basename: HGBA1C,  in the last 168 hours Fasting Lipid Panel: No results found for this basename: CHOL, HDL, LDLCALC,  TRIG, CHOLHDL, LDLDIRECT,  in the last 168 hours Thyroid Function Tests: No results found for this basename: TSH, T4TOTAL, FREET4, T3FREE, THYROIDAB,  in the last 168 hours Coagulation: No results found for this basename: LABPROT, INR,  in the last 168 hours Anemia Panel: No results found for this basename: VITAMINB12, FOLATE, FERRITIN, TIBC, IRON, RETICCTPCT,  in the last 168 hours Urine Drug Screen: Drugs of Abuse     Component Value Date/Time   LABOPIA NONE DETECTED 09/14/2013 2359   LABOPIA NEG 02/01/2012 1356   COCAINSCRNUR NONE DETECTED 09/14/2013 2359   COCAINSCRNUR PPS 02/01/2012 1356   LABBENZ NONE DETECTED 09/14/2013 2359   LABBENZ PPS 02/01/2012 1356   LABBENZ NEG 07/09/2010 1522   AMPHETMU NONE DETECTED 09/14/2013 2359   AMPHETMU NEG 07/09/2010 1522   THCU NONE DETECTED 09/14/2013 2359   LABBARB NONE  DETECTED 09/14/2013 2359   LABBARB NEG 02/01/2012 1356    Alcohol Level: No results found for this basename: ETH,  in the last 168 hours Urinalysis: No results found for this basename: COLORURINE, APPERANCEUR, LABSPEC, PHURINE, GLUCOSEU, HGBUR, BILIRUBINUR, KETONESUR, PROTEINUR, UROBILINOGEN, NITRITE, LEUKOCYTESUR,  in the last 168 hours   Micro Results: No results found for this or any previous visit (from the past 240 hour(s)). Studies/Results: No results found. Medications: I have reviewed the patient's current medications. Scheduled Meds: . allopurinol  100 mg Oral Daily  . aspirin EC  81 mg Oral Daily  . atorvastatin  80 mg Oral q1800  . bacitracin-neomycin-polymyxin-hydrocortisone   Topical BID  . famotidine  20 mg Oral Daily  . gabapentin  200 mg Oral TID  . heparin  5,000 Units Subcutaneous 3 times per day  . hydrALAZINE  50 mg Oral TID  . insulin aspart  0-20 Units Subcutaneous TID WC  . insulin aspart  0-5 Units Subcutaneous QHS  . insulin aspart  7 Units Subcutaneous TID WC  . insulin glargine  20 Units Subcutaneous QHS  . isosorbide mononitrate  30 mg Oral Daily  . metolazone  5 mg Oral BID  . potassium chloride  40 mEq Oral BID  . sodium chloride  3 mL Intravenous Q12H   Continuous Infusions: . furosemide (LASIX) infusion 20 mg/hr (10/01/13 0700)   PRN Meds:.sodium chloride, albuterol, loperamide, LORazepam, morphine injection, ondansetron (ZOFRAN) IV, oxyCODONE, senna, sodium chloride Assessment/Plan: Mr. Longley is a 48yo man w/ PMHx of poorly controlled Type 2 DM, CKD Stage 3, chronic systolic non-ischemic cardiomyopathy with AICD (EF 10%) 2/2 polysubstance abuse (alcohol, cocaine) who was admitted for abdominal pain, nausea, vomiting, and diarrhea and cardiorenal syndrome.   1. Cardiorenal syndrome: CHF complicated by CKD. Weight is 219 lb today, stable. Baseline weight appears to be ~205 lbs. Patient has not been compliant with his diet, not watching fluid  intake. Lasix drip was restarted on 9/11, increased to 20 mg. He has diuresed 1.3 L in last 24 hours, -35.3 L since admission. Cr 1.95 today. Cr has been stable around 2. Continue to stress importance of restricting fluid intake and avoiding salt.  - Heart Failure team following, appreciate recs  - Continue Hydralazine 50 mg TID  - Continue Metolazone 5 mg BID  - Stopping Milrinone today per HF Team. Not a home Milrinone candidate due to noncompliance and drug abuse (cocaine)  - Lasix drip per HF, continue 20 mg  - Continue Imdur 30 mg daily  - KDur 40 mg po BID  - Keep central line to access CVP and CO-Hb  - Avoid beta  blocker due to low output  - Avoid ACE- inhibitor due to acute renal failure  - Continue strict I&Os  - Daily weights   2. Acute gout flare: Elbow pain has improved. Pt now having severe pain in feet due to gout. Pt received 3 days of Prednisone 40 mg, which was completed on 9/1 for elbow pain, now with pain in feet esp at 1st MTPs. Attempted to give Prednisone but pt initially refused due to concerns about increasing blood sugars; agreeable now. Not giving Colchicine 2/2 renal function. Uric acid level elevated at 11.7.  - Continue Allopurinol 100 mg daily, will likely need to increase  - Continue Prednisone 40 mg daily x 3 d (started 9/12), last day - Insulin increased to 7 units TID - Continue Gabapentin 200 mg TID   3. Type 2 DM: Pt's blood sugars have in 150s-300s. Patient noncompliant with diet and now getting Prednisone for gout flare. Mealtime Novolog was increased to 7 units TID. Will keep same dose since patient now refusing to eat.  - Continue Lantus 20 units QHS  - Continue Novolog 7 units TID with meals - Continue insulin sliding scale   4. HTN: BP 90s-130s/60s-70s.  - Continue to monitor  - Continue Hydralazine 50 mg TID  - Continue Lasix drip as above  - Continue Metolazone 5 mg daily   5. Hyponatremia: Patient asymptomatic. Patient's sodium was 125 on  9/1 and he received 1 dose of Tolvaptan 30 mg. Na improved to 130 but began down trending again. Hyponatremia likely 2/2 hypervolemia. Improved to 130, stable. Continue to diurese.  - AM BMP   6. CAD: Continue home atorvastatin 80 mg daily and ASA 81 mg daily.   7.GERD:  - Continue famotidine 20mg    8. Asthma:  - Continue albuterol 2.5mg  q2h prn for wheezing.   Diet: Heart Healthy/Carb modified  DVT/PE PPx: Heparin SQ  Dispo: Deferred at this time. Awaiting resolution of medical problems. Likely discharge in next 1-2 days.   The patient does have a current PCP Baltazar Apo, MD) and does need an [facility] hospital follow-up appointment after discharge.  The patient does not have transportation limitations that hinder transportation to clinic appointments.  .Services Needed at time of discharge: Y = Yes, Blank = No PT:   OT:   RN:   Equipment:   Other:     LOS: 17 days   Rich Number, MD 10/01/2013, 11:35 AM

## 2013-10-01 NOTE — Progress Notes (Addendum)
Advanced Heart Failure Rounding Note   Subjective:    Mr. Kopke is a 48y/o male with HTN, DM2, polysunbstance abuse with longstanding HF due to NICM (probably ETOH), EF 10%. Current cocaine abuse. Was previously on [facility].   Admitted with ab pain & n/v/diarrhea. Weight up to 231.   Noncontrast CT ab: Diverticulosis of the colon with mild thickening of the sigmoid colon suggesting early diverticulitis. No abscess. Hepatic cirrhosis with mild ascites and edema throughout the subcutaneous soft tissues. Renal function stable.   Lasix gtt at 20mg /hr. Patient still having difficulty with following low salt diet. Milrinone remains at 0.125 and co-ox 64%. Renal function stable. 24 hr I/O -1.3 liters, weight up 1 lb.   Cr  1.9->2.0>1.98>2.1>2.18>2.09> 2.11> 2.06>1.95  Denies SOB/CP    Objective:   Weight Range:  Vital Signs:   Temp:  [97.4 F (36.3 C)-98.6 F (37 C)] 97.4 F (36.3 C) (09/14 0755) Pulse Rate:  [59-112] 59 (09/14 0755) BP: (95-137)/(64-87) 97/67 mmHg (09/14 0755) SpO2:  [98 %-100 %] 100 % (09/14 0755) Weight:  [219 lb 5.7 oz (99.5 kg)] 219 lb 5.7 oz (99.5 kg) (09/14 0442) Last BM Date:  (pt does not recall last BM)  Weight change: Filed Weights   09/29/13 0400 09/30/13 0308 10/01/13 0442  Weight: 217 lb 13 oz (98.8 kg) 218 lb 4.1 oz (99 kg) 219 lb 5.7 oz (99.5 kg)    Intake/Output:   Intake/Output Summary (Last 24 hours) at 10/01/13 1101 Last data filed at 10/01/13 1000  Gross per 24 hour  Intake 1550.7 ml  Output   2300 ml  Net -749.3 ml     Physical Exam: CVP 24   General: Chronically ill appearing. No resp difficulty; Lying in bed.   HEENT: normal  Neck: supple. RIJ central line. JVP to jaw . Carotids 2+ bilaterally; no bruits. No lymphadenopathy or thryomegaly appreciated.  Cor: PMI normal. Tachy & regular rhythm. 2/6 systolic murmur LLSB. + S3  Lungs: clear  Abdomen: obese, soft, nontender, non distended. No hepatosplenomegaly. No bruits or  masses. Good bowel sounds.  Extremities: no cyanosis, clubbing, rash. R and LLE 1+ edema. No edema; both feet and 1st MTP sore to touch  Neuro: alert & orientedx3, cranial nerves grossly intact. Moves all 4 extremities w/o difficulty. Affect pleasant.when   Telemetry: ST PVCs 100s  Labs: Basic Metabolic Panel:  Recent Labs Lab 09/27/13 0400 09/28/13 0500 09/29/13 0429 09/30/13 0337 10/01/13 0436  NA 128* 129* 130* 128* 130*  K 3.5* 3.3* 4.0 4.0 3.1*  CL 81* 80* 84* 82* 83*  CO2 35* 35* 33* 31 32  GLUCOSE 116* 154* 253* 292* 174*  BUN 85* 80* 80* 83* 87*  CREATININE 2.18* 2.09* 2.11* 2.06* 1.95*  CALCIUM 8.9 9.1 9.3 9.5 9.8  MG  --  2.4  --   --   --     Liver Function Tests: No results found for this basename: AST, ALT, ALKPHOS, BILITOT, PROT, ALBUMIN,  in the last 168 hours No results found for this basename: LIPASE, AMYLASE,  in the last 168 hours No results found for this basename: AMMONIA,  in the last 168 hours  CBC:  Recent Labs Lab 09/29/13 0429  WBC 4.7  HGB 13.9  HCT 42.2  MCV 84.1  PLT 209    Cardiac Enzymes: No results found for this basename: CKTOTAL, CKMB, CKMBINDEX, TROPONINI,  in the last 168 hours  BNP: BNP (last 3 results)  Recent Labs  07/12/13 1344 09/08/13  2224 09/14/13 1203  PROBNP 5773.0* 6632.0* 10023*     Other results:  EKG:   Imaging: No results found.   Medications:     Scheduled Medications: . allopurinol  100 mg Oral Daily  . aspirin EC  81 mg Oral Daily  . atorvastatin  80 mg Oral q1800  . bacitracin-neomycin-polymyxin-hydrocortisone   Topical BID  . famotidine  20 mg Oral Daily  . gabapentin  200 mg Oral TID  . heparin  5,000 Units Subcutaneous 3 times per day  . hydrALAZINE  50 mg Oral TID  . insulin aspart  0-20 Units Subcutaneous TID WC  . insulin aspart  0-5 Units Subcutaneous QHS  . insulin aspart  7 Units Subcutaneous TID WC  . insulin glargine  20 Units Subcutaneous QHS  . isosorbide mononitrate   30 mg Oral Daily  . metolazone  5 mg Oral BID  . potassium chloride  40 mEq Oral BID  . sodium chloride  3 mL Intravenous Q12H    Infusions: . furosemide (LASIX) infusion 20 mg/hr (10/01/13 0700)  . milrinone 0.125 mcg/kg/min (10/01/13 0700)    PRN Medications: sodium chloride, albuterol, loperamide, LORazepam, morphine injection, ondansetron (ZOFRAN) IV, oxyCODONE, senna, sodium chloride   Assessment:   1. A/c systolic HF with low output physiology  --d/c weight in 6/15 was 206 pounds  2. NICM EF 10-15%  3. A/c renal failure, stage IV (baseline Cr ~2.0-2.5)  4. Hyperkalemia  5. NSVT  6. N/V/diarrhea -  7. H/o polysubstance abuse  8. Noncompliance  9. Hyponatremia/Hypokalemia 10. Acute gout 11. NSVT 12. Hyokalemia  Plan/Discussion:    Very difficult situation patient is end stage HF and remains markedly volume overloaded even with high dose diuretics d/t dietary non-compliance. Multiple conversations over the past 2 weeks have taken place with patient about compliance, however he continues to eat high salty foods and be disrespectful to staff.  Patient remains on hospice. Co-ox have been good with weaning milrinone and will stop today.    Aundria Rud, NP-C 11:01 AM  Patient seen and examined with Ulla Potash, NP. We discussed all aspects of the encounter. I agree with the assessment and plan as stated above.   Another long talk with him about his poor interactions with staff and continued dietary indiscretion. Told him that if these things didn't improve there would be nothing else to do for him and that there wouldn't be any reason to keep him in the hospital. Agree with stopping milrinone. Continue IV lasix. Hopefully we can get him home this week. Supp K+  [name] 7:18 PM

## 2013-10-01 NOTE — Progress Notes (Addendum)
Patient requesting his IV morphine to be directly injected into central line and flushed fast.  RN notified patient that this is not the safe way to administer this medication.  Patient stated, "So you aren't going to do anything I'm asking you?" Patient offered and refusing PO Oxy, Ativan & heating pads; patient is requesting to talk to charge nurse.  Patient's pain still 10/10 in bilateral feet, lower back and head, despite prn IV morphine.   MD notified of patient's response to IV morphine and Charge Nurse notified of patient's wishes. [place], Mitzi Hansen

## 2013-10-02 ENCOUNTER — Ambulatory Visit: Payer: Medicare Other | Admitting: Internal Medicine

## 2013-10-02 LAB — GLUCOSE, CAPILLARY
GLUCOSE-CAPILLARY: 128 mg/dL — AB (ref 70–99)
GLUCOSE-CAPILLARY: 189 mg/dL — AB (ref 70–99)
GLUCOSE-CAPILLARY: 154 mg/dL — AB (ref 70–99)
Glucose-Capillary: 152 mg/dL — ABNORMAL HIGH (ref 70–99)

## 2013-10-02 LAB — CARBOXYHEMOGLOBIN
CARBOXYHEMOGLOBIN: 1.2 % (ref 0.5–1.5)
Methemoglobin: 0.6 % (ref 0.0–1.5)
O2 Saturation: 57.1 %
Total hemoglobin: 14.2 g/dL (ref 13.5–18.0)

## 2013-10-02 LAB — BASIC METABOLIC PANEL
ANION GAP: 15 (ref 5–15)
BUN: 96 mg/dL — AB (ref 6–23)
CO2: 32 meq/L (ref 19–32)
CREATININE: 2.3 mg/dL — AB (ref 0.50–1.35)
Calcium: 9.9 mg/dL (ref 8.4–10.5)
Chloride: 80 mEq/L — ABNORMAL LOW (ref 96–112)
GFR calc non Af Amer: 32 mL/min — ABNORMAL LOW (ref >90)
GFR, EST AFRICAN AMERICAN: 37 mL/min — AB (ref >90)
Glucose, Bld: 179 mg/dL — ABNORMAL HIGH (ref 70–99)
POTASSIUM: 3.7 meq/L (ref 3.7–5.3)
Sodium: 127 mEq/L — ABNORMAL LOW (ref 137–147)

## 2013-10-02 MED ORDER — ISOSORBIDE MONONITRATE ER 30 MG PO TB24
30.0000 mg | ORAL_TABLET | Freq: Two times a day (BID) | ORAL | Status: DC
  Administered 2013-10-02 – 2013-10-08 (×12): 30 mg via ORAL
  Filled 2013-10-02 (×15): qty 1

## 2013-10-02 MED ORDER — HYDRALAZINE HCL 50 MG PO TABS
75.0000 mg | ORAL_TABLET | Freq: Three times a day (TID) | ORAL | Status: DC
  Filled 2013-10-02 (×2): qty 1

## 2013-10-02 MED ORDER — HYDRALAZINE HCL 50 MG PO TABS
50.0000 mg | ORAL_TABLET | Freq: Three times a day (TID) | ORAL | Status: DC
  Administered 2013-10-02 – 2013-10-06 (×12): 50 mg via ORAL
  Filled 2013-10-02 (×14): qty 1

## 2013-10-02 NOTE — Progress Notes (Addendum)
Noted Dr Gala Romney had increased hydralazine up to 75mg  TID, pt am dose of 50mg  was held due to low BP, 94/76 & 99/77.  Due to give evening dose and currently BP 92/71. [place], [place] discussed with her, received orders to decrease dose back to 50mg  TID, and parameters to hold if SBP<85.

## 2013-10-02 NOTE — Progress Notes (Signed)
RM [id number]  [name]                          [facility] SW Note:  Pt easily awakened to his name. He shared some about what he has been told about his condition and tx plans. His understanding is that different medications will be tried and possibly in next week or so, he will be able to return home. He is not opposed to remaining in the hospital until he is "stable" enough to return home. He continues to complain of being tired. He voiced no particular needs other than the need for company. Sw will continue to follow to extend support and assess for any needs. Exie Parody [place],LCSW

## 2013-10-02 NOTE — Progress Notes (Signed)
Pt seen and examined. Please refer to resident note for details   Pt c/o pain "everywhere" but especially in his right foot and head.  No CP  Exam:  Gen- AAO*3  Cardio- mildly tachycardic, SEM +  Lungs- CTA b/l  Ext- improving LE edema  Abd- soft, non tender, BS +   Assessment and Plan:  48 y/o man with cardiorenal syndrome now improving on milrinone and diuretics   Cardiorenal syndrome:  - cardio f/u noted. Pt with worsening CVP and creatinine off milrinone and may be inotrope dependent but not a candidate for home milrinone - C/w lasix gtt and metolazone  - c/w hydralazine, imdur  - If continues to worsen may need hospice per cardio  Hyponatremia:  - Will monitor  - Likely hypervolemic hyponatremia. Na stable. Will monitor   DM:  - Would c/w current regimen and monitor. BS stable today   Gout:  - prednisone dc'd.  - c/w allopurinol  Dr. Rogelia Boga to assume care in AM

## 2013-10-02 NOTE — Progress Notes (Signed)
Inpatient RN visit-[name] [facility] 2H Room 23 -[facility] RN Visit-[name] RN  Related admission to [facility] diagnosis of Hypertrophic Cardiomyopathy. Pt is Full Code.   Pt alert & oriented, lying in bed, drowsy, but able to interact with Clinical research associate. [facility] SW present during visit. Some dyspnea noted with talking. Pt reports he has not felt well enough to get up and walk today. Pt shared his understanding that he is not doing well off the milrinone. He stated that he understood his heart was "not pumping the fluid the way it should". Per chart review pt's CVP was up to 25 this am and his renal function is worse, he remains on continuous IV lasix for management of fluid volume overload. He has used both IV morphine and oxycodone today to manage pain. [facility] will continue to follow.  Patient's home medication list and transfer summary in place  on shadow chart.   Please call [facility] @ [phone]- with any hospice needs.   Thank you. Jonathan Starling, RN  [facility]  Hospice Liaison  [phone])

## 2013-10-02 NOTE — Progress Notes (Signed)
Advanced Heart Failure Rounding Note   Subjective:    [name] is a 48y/o male with HTN, DM2, polysunbstance abuse with longstanding HF due to NICM (probably ETOH), EF 10%. Current cocaine abuse. Was previously on [facility].   Admitted with ab pain & n/v/diarrhea. Weight up to 231.   Noncontrast CT ab: Diverticulosis of the colon with mild thickening of the sigmoid colon suggesting early diverticulitis. No abscess. Hepatic cirrhosis with mild ascites and edema throughout the subcutaneous soft tissues. Renal function stable.   Lasix gtt at /hr. Yesterday stopped milrinone d/t co-ox stable. Co-ox down slightly to 57% and renal function trending up. Dr. Gala Romney had lengthy discussion with patient again yesterday about his interactions with staff and dietary indiscretion. Weight down 1 lb, 24 hr I/O -2.6 liters.   Feels much worse today. + SOB at rest and CP.  CVP 25   Cr  1.9->2.0>1.98>2.1>2.18>2.09> 2.11> 2.06>1.95> 2.3   Objective:   Weight Range:  Vital Signs:   Temp:  [97.5 F (36.4 C)-98.7 F (37.1 C)] 98.7 F (37.1 C) (09/15 0822) Pulse Rate:  [93-112] 112 (09/15 0822) Resp:  [20] 20 (09/15 0300) BP: (93-114)/(69-78) 99/77 mmHg (09/15 0845) SpO2:  [95 %-100 %] 98 % (09/15 0822) Weight:  [218 lb 4.1 oz (99 kg)] 218 lb 4.1 oz (99 kg) (09/15 0300) Last BM Date: 09/27/13 (pt states 5-6 days ago )  Weight change: Filed Weights   09/30/13 0308 10/01/13 0442 10/02/13 0300  Weight: 218 lb 4.1 oz (99 kg) 219 lb 5.7 oz (99.5 kg) 218 lb 4.1 oz (99 kg)    Intake/Output:   Intake/Output Summary (Last 24 hours) at 10/02/13 0929 Last data filed at 10/02/13 0900  Gross per 24 hour  Intake  923.8 ml  Output   3300 ml  Net -2376.2 ml     Physical Exam: CVP 24   General: Chronically ill appearing. No resp difficulty; Lying in bed.   HEENT: normal  Neck: supple. RIJ central line. JVP to jaw . Carotids 2+ bilaterally; no bruits. No lymphadenopathy or thryomegaly  appreciated.  Cor: PMI normal. Tachy & regular rhythm. 2/6 systolic murmur LLSB. + S3  Lungs: clear  Abdomen: obese, soft, nontender, non distended. No hepatosplenomegaly. No bruits or masses. Good bowel sounds.  Extremities: no cyanosis, clubbing, rash. R and LLE 1+ edema. No edema; both feet and 1st MTP sore to touch  Neuro: alert & orientedx3, cranial nerves grossly intact. Moves all 4 extremities w/o difficulty. Affect pleasant.when   Telemetry: ST PVCs 100s  Labs: Basic Metabolic Panel:  Recent Labs Lab 09/28/13 0500 09/29/13 0429 09/30/13 0337 10/01/13 0436 10/02/13 0420  NA 129* 130* 128* 130* 127*  K 3.3* 4.0 4.0 3.1* 3.7  CL 80* 84* 82* 83* 80*  CO2 35* 33* 31 32 32  GLUCOSE 154* 253* 292* 174* 179*  BUN 80* 80* 83* 87* 96*  CREATININE 2.09* 2.11* 2.06* 1.95* 2.30*  CALCIUM 9.1 9.3 9.5 9.8 9.9  MG 2.4  --   --   --   --     Liver Function Tests: No results found for this basename: AST, ALT, ALKPHOS, BILITOT, PROT, ALBUMIN,  in the last 168 hours No results found for this basename: LIPASE, AMYLASE,  in the last 168 hours No results found for this basename: AMMONIA,  in the last 168 hours  CBC:  Recent Labs Lab 09/29/13 0429  WBC 4.7  HGB 13.9  HCT 42.2  MCV 84.1  PLT 209  Cardiac Enzymes: No results found for this basename: CKTOTAL, CKMB, CKMBINDEX, TROPONINI,  in the last 168 hours  BNP: BNP (last 3 results)  Recent Labs  07/12/13 1344 09/08/13 2224 09/14/13 1203  PROBNP 5773.0* 6632.0* 10023*     Other results:  EKG:   Imaging: No results found.   Medications:     Scheduled Medications: . allopurinol  100 mg Oral Daily  . aspirin EC  81 mg Oral Daily  . atorvastatin  80 mg Oral q1800  . bacitracin-neomycin-polymyxin-hydrocortisone   Topical BID  . famotidine  20 mg Oral Daily  . gabapentin  200 mg Oral TID  . heparin  5,000 Units Subcutaneous 3 times per day  . hydrALAZINE  50 mg Oral TID  . insulin aspart  0-20 Units  Subcutaneous TID WC  . insulin aspart  0-5 Units Subcutaneous QHS  . insulin aspart  7 Units Subcutaneous TID WC  . insulin glargine  20 Units Subcutaneous QHS  . isosorbide mononitrate  30 mg Oral Daily  . metolazone  5 mg Oral BID  . potassium chloride  40 mEq Oral BID  . sodium chloride  3 mL Intravenous Q12H    Infusions: . furosemide (LASIX) infusion 20 mg/hr (10/02/13 0404)    PRN Medications: sodium chloride, albuterol, loperamide, LORazepam, morphine injection, ondansetron (ZOFRAN) IV, oxyCODONE, senna, sodium chloride   Assessment:   1. A/c systolic HF with low output physiology  --d/c weight in 6/15 was 206 pounds  2. NICM EF 10-15%  3. A/c renal failure, stage IV (baseline Cr ~2.0-2.5)  4. Hyperkalemia  5. NSVT  6. N/V/diarrhea -  7. H/o polysubstance abuse  8. Noncompliance  9. Hyponatremia/Hypokalemia 10. Acute gout 11. NSVT 12. Hyokalemia  Plan/Discussion:    Very difficult situation patient is end stage HF and remains markedly volume overloaded even with high dose diuretics. Patient has been very non-compliant with staff and has had very poor interactions with staff. Dr. Gala Romney had another lengthy discussion with patient yesterday about his prognosis and his behavior.   Will continue lasix gtt and continue to follow renal function and co-ox. Very poor prognosis and difficult situation. Patient is clearly suffering from end stage HF. He is followed by Hospice, however remains a full code. Need to get Palliative Team involved again to have serious conversation with patient about goals of care. Patient may qualify for [facility].   Jonathan Rud, NP-C 9:29 AM  Patient seen and examined with Jonathan Potash, NP. We discussed all aspects of the encounter. I agree with the assessment and plan as stated above.   He is much worse today off milrinone. I worry he is going back into shock. CVP now 25 and renal function worse. He now appears to be inotrope  dependent but is not a candidate for home inotropes and I did not present this option to him. At this point will not restart milrinone but will try to maximize vasodilators. Suspect we may need to consider a Hospice facility at some point soon.   Jonathan Zaragoza,MD 12:56 PM

## 2013-10-02 NOTE — Progress Notes (Signed)
Subjective: Patient reports some mild chest pain this morning, but no different than his baseline. He is still upset this AM. He states he ate dinner last night but has been restricting his fluid intake because he "needs to lose the weight." Patient's weight is stable at 218-219 lbs. He diuresed 2.6 L in last 24 hours. Nurse reports that he has been cooperative with everyone, much nicer.   Objective: Vital signs in last 24 hours: Filed Vitals:   10/02/13 0400 10/02/13 0500 10/02/13 0600 10/02/13 0822  BP:    94/76  Pulse: 105 96 93 112  Temp:    98.7 F (37.1 C)  TempSrc:    Oral  Resp:      Height:      Weight:      SpO2:    98%   Weight change: -1 lb 1.6 oz (-0.5 kg)  Intake/Output Summary (Last 24 hours) at 10/02/13 0826 Last data filed at 10/02/13 0700  Gross per 24 hour  Intake  994.7 ml  Output   3300 ml  Net -2305.3 ml   Physical Exam General: standing up at bedside, alert, cooperative HEENT: [place]/AT, EOMI, mucus membranes moist CV: tachycardic, 2/6 systolic murmur Pulm: CTA bilaterally, breaths non-labored Ext: 2+ pitting edema, warm  Lab Results: Basic Metabolic Panel:  Recent Labs Lab 09/28/13 0500  10/01/13 0436 10/02/13 0420  NA 129*  < > 130* 127*  K 3.3*  < > 3.1* 3.7  CL 80*  < > 83* 80*  CO2 35*  < > 32 32  GLUCOSE 154*  < > 174* 179*  BUN 80*  < > 87* 96*  CREATININE 2.09*  < > 1.95* 2.30*  CALCIUM 9.1  < > 9.8 9.9  MG 2.4  --   --   --   < > = values in this interval not displayed. Liver Function Tests: No results found for this basename: AST, ALT, ALKPHOS, BILITOT, PROT, ALBUMIN,  in the last 168 hours No results found for this basename: LIPASE, AMYLASE,  in the last 168 hours No results found for this basename: AMMONIA,  in the last 168 hours CBC:  Recent Labs Lab 09/29/13 0429  WBC 4.7  HGB 13.9  HCT 42.2  MCV 84.1  PLT 209   Cardiac Enzymes: No results found for this basename: CKTOTAL, CKMB, CKMBINDEX, TROPONINI,  in the last  168 hours BNP: No results found for this basename: PROBNP,  in the last 168 hours D-Dimer: No results found for this basename: DDIMER,  in the last 168 hours CBG:  Recent Labs Lab 09/30/13 1711 09/30/13 2206 10/01/13 0806 10/01/13 1150 10/01/13 1619 10/01/13 2107  GLUCAP 365* 191* 155* 161* 235* 328*   Hemoglobin A1C: No results found for this basename: HGBA1C,  in the last 168 hours Fasting Lipid Panel: No results found for this basename: CHOL, HDL, LDLCALC, TRIG, CHOLHDL, LDLDIRECT,  in the last 168 hours Thyroid Function Tests: No results found for this basename: TSH, T4TOTAL, FREET4, T3FREE, THYROIDAB,  in the last 168 hours Coagulation: No results found for this basename: LABPROT, INR,  in the last 168 hours Anemia Panel: No results found for this basename: VITAMINB12, FOLATE, FERRITIN, TIBC, IRON, RETICCTPCT,  in the last 168 hours Urine Drug Screen: Drugs of Abuse     Component Value Date/Time   LABOPIA NONE DETECTED 09/14/2013 2359   LABOPIA NEG 02/01/2012 1356   COCAINSCRNUR NONE DETECTED 09/14/2013 2359   COCAINSCRNUR PPS 02/01/2012 1356   LABBENZ  NONE DETECTED 09/14/2013 2359   LABBENZ PPS 02/01/2012 1356   LABBENZ NEG 07/09/2010 1522   AMPHETMU NONE DETECTED 09/14/2013 2359   AMPHETMU NEG 07/09/2010 1522   THCU NONE DETECTED 09/14/2013 2359   LABBARB NONE DETECTED 09/14/2013 2359   LABBARB NEG 02/01/2012 1356    Alcohol Level: No results found for this basename: ETH,  in the last 168 hours Urinalysis: No results found for this basename: COLORURINE, APPERANCEUR, LABSPEC, PHURINE, GLUCOSEU, HGBUR, BILIRUBINUR, KETONESUR, PROTEINUR, UROBILINOGEN, NITRITE, LEUKOCYTESUR,  in the last 168 hours   Micro Results: No results found for this or any previous visit (from the past 240 hour(s)). Studies/Results: No results found. Medications: I have reviewed the patient's current medications. Scheduled Meds: . allopurinol  100 mg Oral Daily  . aspirin EC  81 mg Oral Daily    . atorvastatin  80 mg Oral q1800  . bacitracin-neomycin-polymyxin-hydrocortisone   Topical BID  . famotidine  20 mg Oral Daily  . gabapentin  200 mg Oral TID  . heparin  5,000 Units Subcutaneous 3 times per day  . hydrALAZINE  50 mg Oral TID  . insulin aspart  0-20 Units Subcutaneous TID WC  . insulin aspart  0-5 Units Subcutaneous QHS  . insulin aspart  7 Units Subcutaneous TID WC  . insulin glargine  20 Units Subcutaneous QHS  . isosorbide mononitrate  30 mg Oral Daily  . metolazone  5 mg Oral BID  . potassium chloride  40 mEq Oral BID  . sodium chloride  3 mL Intravenous Q12H   Continuous Infusions: . furosemide (LASIX) infusion 20 mg/hr (10/02/13 0404)   PRN Meds:.sodium chloride, albuterol, loperamide, LORazepam, morphine injection, ondansetron (ZOFRAN) IV, oxyCODONE, senna, sodium chloride Assessment/Plan: Mr. Telford is a 48yo man w/ PMHx of poorly controlled Type 2 DM, CKD Stage 3, chronic systolic non-ischemic cardiomyopathy with AICD (EF 10%) 2/2 polysubstance abuse (alcohol, cocaine) who was admitted for abdominal pain, nausea, vomiting, and diarrhea and cardiorenal syndrome.   1. Cardiorenal syndrome: CHF complicated by CKD. Weight is 218 lb today, stable. Baseline weight appears to be ~205 lbs. Patient has not been compliant with his diet, not watching fluid intake. Lasix drip was restarted on 9/11, increased to 20 mg. He has diuresed 2.7 L in last 24 hours, -38.1 L since admission. Cr 2.30 today, has increased since yesterday. Cr has been stable around 2. Continue to stress importance of restricting fluid intake and avoiding salt.  - Heart Failure team following, appreciate recs  - Continue Hydralazine 50 mg TID  - Continue Metolazone 5 mg BID  - Pt off Milrinone for 24 hours. Not a home Milrinone candidate due to noncompliance and drug abuse (cocaine)  - Lasix drip per HF, continue 20 mg  - Continue Imdur 30 mg daily  - KDur 40 mg po BID  - Keep central line to access  CVP and CO-Hb  - Avoid beta blocker due to low output  - Avoid ACE- inhibitor due to acute renal failure  - Continue strict I&Os  - Daily weights   2. Acute gout flare: Elbow pain has improved. Pt now having severe pain in feet due to gout. Pt received 3 days of Prednisone 40 mg, which was completed on 9/1 for elbow pain, now with pain in feet esp at 1st MTPs. Attempted to give Prednisone but pt initially refused due to concerns about increasing blood sugars; agreeable now. Not giving Colchicine 2/2 renal function. Uric acid level elevated at 11.7. Pt completed another  dose of Prednisone 40 mg daily on 9/14.  - Continue Allopurinol 100 mg daily, will likely need to increase  - Insulin increased to 7 units TID yesterday, may need to increase more since blood sugars still in 200-300s.  - Continue Gabapentin 200 mg TID   3. Type 2 DM: Pt's blood sugars have in 150s-300s. Patient noncompliant with diet. Mealtime Novolog was increased to 7 units TID. Patient was refusing to eat breakfast yesterday, but then ate lunch and dinner. May need to increase insulin more if blood sugars continue to stay in 200-300s.  - Continue Lantus 20 units QHS  - Continue Novolog 7 units TID with meals  - Continue insulin sliding scale   4. HTN: BP 90s-110s/60s-70s.  - Continue to monitor  - Continue Hydralazine 50 mg TID  - Continue Lasix drip as above  - Continue Metolazone 5 mg daily   5. Hyponatremia: Patient asymptomatic. Patient's sodium was 125 on 9/1 and he received 1 dose of Tolvaptan 30 mg. Na improved to 130 but began down trending again. Hyponatremia likely 2/2 hypervolemia. Improved to 130 and was stable, but now 127 this AM. Continue to diurese.  - AM BMP   6. CAD: Continue home atorvastatin 80 mg daily and ASA 81 mg daily.   7.GERD:  - Continue famotidine    8. Asthma:  - Continue albuterol 2.5mg  q2h prn for wheezing.   Diet: Heart Healthy/Carb modified  DVT/PE PPx: Heparin SQ  Dispo:  Deferred at this time. Awaiting resolution of medical problems. Likely discharge in next 1-2 days.   The patient does have a current PCP Baltazar Apo, MD) and does need an [facility] hospital follow-up appointment after discharge.  The patient does not have transportation limitations that hinder transportation to clinic appointments.  .Services Needed at time of discharge: Y = Yes, Blank = No PT:   OT:   RN:   Equipment:   Other:     LOS: 18 days   Rich Number, MD 10/02/2013, 8:26 AM

## 2013-10-03 DIAGNOSIS — M109 Gout, unspecified: Secondary | ICD-10-CM

## 2013-10-03 LAB — CARBOXYHEMOGLOBIN
Carboxyhemoglobin: 1 % (ref 0.5–1.5)
METHEMOGLOBIN: 0.6 % (ref 0.0–1.5)
O2 Saturation: 60.1 %
TOTAL HEMOGLOBIN: 13.8 g/dL (ref 13.5–18.0)

## 2013-10-03 LAB — GLUCOSE, CAPILLARY
GLUCOSE-CAPILLARY: 74 mg/dL (ref 70–99)
GLUCOSE-CAPILLARY: 68 mg/dL — AB (ref 70–99)
Glucose-Capillary: 178 mg/dL — ABNORMAL HIGH (ref 70–99)
Glucose-Capillary: 320 mg/dL — ABNORMAL HIGH (ref 70–99)
Glucose-Capillary: 98 mg/dL (ref 70–99)

## 2013-10-03 LAB — BASIC METABOLIC PANEL
ANION GAP: 19 — AB (ref 5–15)
BUN: 100 mg/dL — ABNORMAL HIGH (ref 6–23)
CHLORIDE: 84 meq/L — AB (ref 96–112)
CO2: 30 mEq/L (ref 19–32)
Calcium: 9.2 mg/dL (ref 8.4–10.5)
Creatinine, Ser: 2.45 mg/dL — ABNORMAL HIGH (ref 0.50–1.35)
GFR calc Af Amer: 35 mL/min — ABNORMAL LOW (ref >90)
GFR calc non Af Amer: 30 mL/min — ABNORMAL LOW (ref >90)
GLUCOSE: 141 mg/dL — AB (ref 70–99)
Potassium: 3.5 mEq/L — ABNORMAL LOW (ref 3.7–5.3)
SODIUM: 133 meq/L — AB (ref 137–147)

## 2013-10-03 MED ORDER — POTASSIUM CHLORIDE CRYS ER 20 MEQ PO TBCR
40.0000 meq | EXTENDED_RELEASE_TABLET | Freq: Once | ORAL | Status: AC
  Administered 2013-10-03: 40 meq via ORAL

## 2013-10-03 NOTE — Progress Notes (Signed)
Subjective: Patient states that he feels well this AM. I discussed the plan with him that he needs to lose 2-4 lbs before he can go home. He has been off the Milrinone drip for 48 hours now. CVP improved to 17 today, down from 24 yesterday. However, Cr trending up again 1.95>2.30>2.45.   Objective: Vital signs in last 24 hours: Filed Vitals:   10/02/13 2307 10/03/13 0311 10/03/13 0830 10/03/13 1000  BP: 83/58  Pulse: 87 94    Temp: 98.7 F (37.1 C) 97.9 F (36.6 C) 98 F (36.7 C)   TempSrc: Oral Oral Oral   Resp: Height:      Weight:  219 lb 9.3 oz (99.6 kg)    SpO2: 97% 97% 97%    Weight change: 1 lb 5.2 oz (0.6 kg)  Intake/Output Summary (Last 24 hours) at 10/03/13 1023 Last data filed at 10/03/13 0700  Gross per 24 hour  Intake   1180 ml  Output   2950 ml  Net  -1770 ml   Physical Exam General: alert, laying in bed, NAD HEENT: [place]/AT, EOMI, mucus membranes moist CV: mildly tachycardic, 2/6 systolic murmur Pulm: CTA bilaterally, breaths non-labored Ext: 1+ pitting edema in lower extremities  Lab Results: Basic Metabolic Panel:  Recent Labs Lab 09/28/13 0500  10/02/13 0420 10/03/13 0445  NA 129*  < > 127* 133*  K 3.3*  < > 3.7 3.5*  CL 80*  < > 80* 84*  CO2 35*  < > 32 30  GLUCOSE 154*  < > 179* 141*  BUN 80*  < > 96* 100*  CREATININE 2.09*  < > 2.30* 2.45*  CALCIUM 9.1  < > 9.9 9.2  MG 2.4  --   --   --   < > = values in this interval not displayed. Liver Function Tests: No results found for this basename: AST, ALT, ALKPHOS, BILITOT, PROT, ALBUMIN,  in the last 168 hours No results found for this basename: LIPASE, AMYLASE,  in the last 168 hours No results found for this basename: AMMONIA,  in the last 168 hours CBC:  Recent Labs Lab 09/29/13 0429  WBC 4.7  HGB 13.9  HCT 42.2  MCV 84.1  PLT 209   Cardiac Enzymes: No results found for this basename: CKTOTAL, CKMB, CKMBINDEX, TROPONINI,  in the last 168  hours BNP: No results found for this basename: PROBNP,  in the last 168 hours D-Dimer: No results found for this basename: DDIMER,  in the last 168 hours CBG:  Recent Labs Lab 10/01/13 2107 10/02/13 0821 10/02/13 1228 10/02/13 1633 10/02/13 2156 10/03/13 0835  GLUCAP 328* 152* 128* 189* 154* 74   Hemoglobin A1C: No results found for this basename: HGBA1C,  in the last 168 hours Fasting Lipid Panel: No results found for this basename: CHOL, HDL, LDLCALC, TRIG, CHOLHDL, LDLDIRECT,  in the last 168 hours Thyroid Function Tests: No results found for this basename: TSH, T4TOTAL, FREET4, T3FREE, THYROIDAB,  in the last 168 hours Coagulation: No results found for this basename: LABPROT, INR,  in the last 168 hours Anemia Panel: No results found for this basename: VITAMINB12, FOLATE, FERRITIN, TIBC, IRON, RETICCTPCT,  in the last 168 hours Urine Drug Screen: Drugs of Abuse     Component Value Date/Time   LABOPIA NONE DETECTED 09/14/2013 2359   LABOPIA NEG 02/01/2012 1356   COCAINSCRNUR NONE DETECTED 09/14/2013 2359   COCAINSCRNUR PPS 02/01/2012 1356   LABBENZ  NONE DETECTED 09/14/2013 2359   LABBENZ PPS 02/01/2012 1356   LABBENZ NEG 07/09/2010 1522   AMPHETMU NONE DETECTED 09/14/2013 2359   AMPHETMU NEG 07/09/2010 1522   THCU NONE DETECTED 09/14/2013 2359   LABBARB NONE DETECTED 09/14/2013 2359   LABBARB NEG 02/01/2012 1356    Alcohol Level: No results found for this basename: ETH,  in the last 168 hours Urinalysis: No results found for this basename: COLORURINE, APPERANCEUR, LABSPEC, PHURINE, GLUCOSEU, HGBUR, BILIRUBINUR, KETONESUR, PROTEINUR, UROBILINOGEN, NITRITE, LEUKOCYTESUR,  in the last 168 hours   Micro Results: No results found for this or any previous visit (from the past 240 hour(s)). Studies/Results: No results found. Medications: I have reviewed the patient's current medications. Scheduled Meds: . allopurinol  100 mg Oral Daily  . aspirin EC  81 mg Oral Daily  .  atorvastatin  80 mg Oral q1800  . bacitracin-neomycin-polymyxin-hydrocortisone   Topical BID  . famotidine  20 mg Oral Daily  . gabapentin  200 mg Oral TID  . heparin  5,000 Units Subcutaneous 3 times per day  . hydrALAZINE  50 mg Oral TID  . insulin aspart  0-20 Units Subcutaneous TID WC  . insulin aspart  0-5 Units Subcutaneous QHS  . insulin aspart  7 Units Subcutaneous TID WC  . insulin glargine  20 Units Subcutaneous QHS  . isosorbide mononitrate  30 mg Oral BID  . metolazone  5 mg Oral BID  . potassium chloride  40 mEq Oral BID  . sodium chloride  3 mL Intravenous Q12H   Continuous Infusions: . furosemide (LASIX) infusion 20 mg/hr (10/03/13 0601)   PRN Meds:.sodium chloride, albuterol, loperamide, LORazepam, morphine injection, ondansetron (ZOFRAN) IV, oxyCODONE, senna, sodium chloride Assessment/Plan: Mr. Duer is a 48yo man w/ PMHx of poorly controlled Type 2 DM, CKD Stage 3, chronic systolic non-ischemic cardiomyopathy with AICD (EF 10%) 2/2 polysubstance abuse (alcohol, cocaine) who was admitted for abdominal pain, nausea, vomiting, and diarrhea and cardiorenal syndrome.  1. Cardiorenal syndrome: CHF complicated by CKD. Weight continues to be stable in 218-219 range. Baseline weight appears to be ~205 lbs. Patient has not been compliant with his diet and not watching fluid intake, but has been making some effort. Lasix drip was restarted on 9/11, at 20 mg. He has diuresed 1.6 L in last 24 hours, -39.7 L since admission. Cr 2.45 today, continues to be trending up. CVP 17 today, down from 24 yesterday. Continue to stress importance of restricting fluid intake and avoiding salt. Per HF Team, if patient can get off another 2-4 lbs (wt 215) then likely can go home in next 1-2 days. Patient previously on Hospice care, would be best option for him at this point. Appreciate hospice following with him.  - Heart Failure team following, appreciate recs  - Continue Hydralazine 50 mg TID  -  Continue Metolazone 5 mg BID  - Pt off Milrinone for 48 hours. Not a home Milrinone candidate due to noncompliance and drug abuse (cocaine)  - Lasix drip per HF, continue 20 mg  - Continue Imdur 30 mg daily  - KDur 40 mg po BID  - Keep central line to access CVP and CO-Hb  - Avoid beta blocker due to low output  - Avoid ACE- inhibitor due to acute renal failure  - Continue strict I&Os  - Daily weights   2. Acute gout flare: Elbow pain has improved. Pt received 3 days of Prednisone 40 mg, which was completed on 9/1 for elbow pain. Patient has  pain in his feet, especially the 1st MTPs, but improved since completion of second 3-day course of Prednisone 40 mg on 9/14.  Not giving Colchicine 2/2 renal function. Uric acid level elevated at 11.7.  - Continue Allopurinol 100 mg daily, will likely need to increase  - Continue Gabapentin 200 mg TID   3. Type 2 DM: Pt's blood sugars have in 70s-180s. Likely improved since patient making an effort to be more compliant with diet and mealtime Novolog increased to 7 units TID.  - Continue Lantus 20 units QHS  - Continue Novolog 7 units TID with meals  - Continue insulin sliding scale   4. HTN: BP 90s-110s/60s-70s.  - Continue to monitor  - Continue Hydralazine 50 mg TID  - Continue Lasix drip as above  - Continue Metolazone 5 mg daily   5. Hyponatremia: Patient asymptomatic. Patient's sodium was 125 on 9/1 and he received 1 dose of Tolvaptan 30 mg. Na improved to 130 but began down trending again. Hyponatremia likely 2/2 hypervolemia. Improved to 133 today. Continue to diurese.  - AM BMP   6. CAD: Continue home atorvastatin 80 mg daily and ASA 81 mg daily.   7.GERD:  - Continue famotidine 20mg    8. Asthma:  - Continue albuterol 2.5mg  q2h prn for wheezing.   Diet: Heart Healthy/Carb modified  DVT/PE PPx: Heparin SQ  Dispo: Deferred at this time. Awaiting resolution of medical problems. Likely discharge in next 1-2 days.  The patient does  have a current PCP Baltazar Apo, MD) and does need an [facility] hospital follow-up appointment after discharge.  The patient does not have transportation limitations that hinder transportation to clinic appointments.  .Services Needed at time of discharge: Y = Yes, Blank = No PT:   OT:   RN:   Equipment:   Other:     LOS: 19 days   Rich Number, MD 10/03/2013, 10:23 AM

## 2013-10-03 NOTE — Progress Notes (Signed)
Inpatient RN visit- [name] [facility] 2H Room 23 -[facility] RN Visit-[name] RN  Related admission to [facility] diagnosis of Hypertrophic Cardiomyopathy. Pt is FULL code.   Pt seen lying in bed, eyes closed, awakened briefly to voice, could not stay awake during visit. When asked about pain he reported pain in his feet and chest. Writer asked if he wanted pain medication, per chart rev last pain med given was yesterday am. Pt stated that he "was trying not to take pain meds", and went back to sleep, did not rouse or interact again with Clinical research associate.  He remains on the lasix gtt and po metolazone for management of fluid volume overload, remains off milrinone. CVP 17, improved from 24 yesterday. Evaluation and medication adjustment for effective symptom management is on going. [facility] will continue to follow.  Patient's home medication list and transfer summary in place on shadow chart.   Please call [facility] @ [phone]- with any hospice needs.   Thank you. Hansel Starling, RN  [facility]  Hospice Liaison  [phone])

## 2013-10-03 NOTE — Progress Notes (Signed)
Advanced Heart Failure Rounding Note   Subjective:    [name] is a 48y/o male with HTN, DM2, polysunbstance abuse with longstanding HF due to NICM (probably ETOH), EF 10%. Current cocaine abuse. Was previously on Hospice Care.   Admitted with ab pain & n/v/diarrhea. Weight up to 231.   Noncontrast CT ab: Diverticulosis of the colon with mild thickening of the sigmoid colon suggesting early diverticulitis. No abscess. Hepatic cirrhosis with mild ascites and edema throughout the subcutaneous soft tissues. Renal function stable.   Lasix gtt at 20mg /hr. He is off milrinone and co-ox 60%. Renal function trending up 2.45. Reports feeling much better this morning. 24 hr I/O -1650. Weight up 1 lb. CVP 17  Cr  1.9->2.0>1.98>2.1>2.18>2.09> 2.11> 2.06>1.95> 2.3> 2.45    Objective:   Weight Range:  Vital Signs:   Temp:  [97.7 F (36.5 C)-98.7 F (37.1 C)] 98 F (36.7 C) (09/16 0830) Pulse Rate:  [86-103] 94 (09/16 0311) Resp:  [18-20] 18 (09/16 0830) BP: (92-117)/(62-93) 97/80 mmHg (09/16 0830) SpO2:  [97 %-100 %] 97 % (09/16 0830) Weight:  [219 lb 9.3 oz (99.6 kg)] 219 lb 9.3 oz (99.6 kg) (09/16 0311) Last BM Date: 10/02/13  Weight change: Filed Weights   10/01/13 0442 10/02/13 0300 10/03/13 0311  Weight: 219 lb 5.7 oz (99.5 kg) 218 lb 4.1 oz (99 kg) 219 lb 9.3 oz (99.6 kg)    Intake/Output:   Intake/Output Summary (Last 24 hours) at 10/03/13 0845 Last data filed at 10/03/13 0700  Gross per 24 hour  Intake   1500 ml  Output   3150 ml  Net  -1650 ml     Physical Exam: CVP 17 General: Chronically ill appearing. No resp difficulty; Lying in bed.   HEENT: normal  Neck: supple. RIJ central line. JVP to jaw . Carotids 2+ bilaterally; no bruits. No lymphadenopathy or thryomegaly appreciated.  Cor: PMI normal. Tachy & regular rhythm. 2/6 systolic murmur LLSB. + S3  Lungs: clear  Abdomen: obese, soft, nontender, non distended. No hepatosplenomegaly. No bruits or masses. Good  bowel sounds.  Extremities: no cyanosis, clubbing, rash. R and LLE 1+ edema. No edema; both feet and 1st MTP sore to touch  Neuro: alert & orientedx3, cranial nerves grossly intact. Moves all 4 extremities w/o difficulty. Affect pleasant.when   Telemetry: ST PVCs 100s  Labs: Basic Metabolic Panel:  Recent Labs Lab 09/28/13 0500 09/29/13 0429 09/30/13 0337 10/01/13 0436 10/02/13 0420 10/03/13 0445  NA 129* 130* 128* 130* 127* 133*  K 3.3* 4.0 4.0 3.1* 3.7 3.5*  CL 80* 84* 82* 83* 80* 84*  CO2 35* 33* 31 32 32 30  GLUCOSE 154* 253* 292* 174* 179* 141*  BUN 80* 80* 83* 87* 96* 100*  CREATININE 2.09* 2.11* 2.06* 1.95* 2.30* 2.45*  CALCIUM 9.1 9.3 9.5 9.8 9.9 9.2  MG 2.4  --   --   --   --   --     Liver Function Tests: No results found for this basename: AST, ALT, ALKPHOS, BILITOT, PROT, ALBUMIN,  in the last 168 hours No results found for this basename: LIPASE, AMYLASE,  in the last 168 hours No results found for this basename: AMMONIA,  in the last 168 hours  CBC:  Recent Labs Lab 09/29/13 0429  WBC 4.7  HGB 13.9  HCT 42.2  MCV 84.1  PLT 209    Cardiac Enzymes: No results found for this basename: CKTOTAL, CKMB, CKMBINDEX, TROPONINI,  in the last 168 hours  BNP: BNP (last 3 results)  Recent Labs  07/12/13 1344 09/08/13 2224 09/14/13 1203  PROBNP 5773.0* 6632.0* 10023*     Other results:  EKG:   Imaging: No results found.   Medications:     Scheduled Medications: . allopurinol  100 mg Oral Daily  . aspirin EC  81 mg Oral Daily  . atorvastatin  80 mg Oral q1800  . bacitracin-neomycin-polymyxin-hydrocortisone   Topical BID  . famotidine  20 mg Oral Daily  . gabapentin  200 mg Oral TID  . heparin  5,000 Units Subcutaneous 3 times per day  . hydrALAZINE  50 mg Oral TID  . insulin aspart  0-20 Units Subcutaneous TID WC  . insulin aspart  0-5 Units Subcutaneous QHS  . insulin aspart  7 Units Subcutaneous TID WC  . insulin glargine  20 Units  Subcutaneous QHS  . isosorbide mononitrate  30 mg Oral BID  . metolazone  5 mg Oral BID  . potassium chloride  40 mEq Oral BID  . sodium chloride  3 mL Intravenous Q12H    Infusions: . furosemide (LASIX) infusion 20 mg/hr (10/03/13 0601)    PRN Medications: sodium chloride, albuterol, loperamide, LORazepam, morphine injection, ondansetron (ZOFRAN) IV, oxyCODONE, senna, sodium chloride   Assessment:   1. A/c systolic HF with low output physiology  --d/c weight in 6/15 was 206 pounds  2. NICM EF 10-15%  3. A/c renal failure, stage IV (baseline Cr ~2.0-2.5)  4. Hyperkalemia  5. NSVT  6. N/V/diarrhea -  7. H/o polysubstance abuse  8. Noncompliance  9. Hyponatremia/Hypokalemia 10. Acute gout 11. NSVT 12. Hyokalemia  Plan/Discussion:    Very difficult situation patient is end stage HF and remains markedly volume overloaded even with high dose diuretics. Milrinone is now off and renal function trending up, however co-ox 60% and patient feels better today. Would like to get another 2-4 lbs off patient before discharging home. CVP 17 this am will continue lasix gtt and metolazone. Supplement K+. Going to be difficult to keep patient comfortable at home especially if he does not follow dietary and fluid restrictions.   He is followed by Hospice, however remains a full code.  Jonathan Rud, NP-C 8:45 AM  Patient seen and examined with Jonathan Potash, NP. We discussed all aspects of the encounter. I agree with the assessment and plan as stated above.   He looks better today. Co-ox improved but renal function worse again. CVP 17. Will continue lasix gtt one more day and hopefully we can get him home soon. Not candidate for home inotropes.   [name] 5:45 PM

## 2013-10-03 NOTE — Progress Notes (Signed)
  Date: 10/03/2013  Patient name: [name]  Medical record number: [id number]  Date of birth: 07/19/1965   This patient has been seen and the plan of care was discussed with the house staff. Please see their note for complete details. I concur with their findings with the following additions/corrections: [name] is sitting side of bed. He is trying not to take the oxycodone as he thinks it is making him too sedated. He doesn't appear in pain. His kids are staying in [place] with his sister, who has temp custody. He hasn't walked in hallways today but has in room. He remains on lasix gtt with goal to diurese another 2-4 lbs prior to D/C.  Burns Spain, MD 10/03/2013, 2:06 PM

## 2013-10-04 LAB — GLUCOSE, CAPILLARY
GLUCOSE-CAPILLARY: 152 mg/dL — AB (ref 70–99)
GLUCOSE-CAPILLARY: 180 mg/dL — AB (ref 70–99)
GLUCOSE-CAPILLARY: 127 mg/dL — AB (ref 70–99)
GLUCOSE-CAPILLARY: 173 mg/dL — AB (ref 70–99)
Glucose-Capillary: 124 mg/dL — ABNORMAL HIGH (ref 70–99)

## 2013-10-04 LAB — BASIC METABOLIC PANEL
ANION GAP: 18 — AB (ref 5–15)
BUN: 101 mg/dL — ABNORMAL HIGH (ref 6–23)
CO2: 32 mEq/L (ref 19–32)
CREATININE: 2.23 mg/dL — AB (ref 0.50–1.35)
Calcium: 9.3 mg/dL (ref 8.4–10.5)
Chloride: 83 mEq/L — ABNORMAL LOW (ref 96–112)
GFR, EST AFRICAN AMERICAN: 39 mL/min — AB (ref >90)
GFR, EST NON AFRICAN AMERICAN: 34 mL/min — AB (ref >90)
Glucose, Bld: 192 mg/dL — ABNORMAL HIGH (ref 70–99)
Potassium: 3.6 mEq/L — ABNORMAL LOW (ref 3.7–5.3)
Sodium: 133 mEq/L — ABNORMAL LOW (ref 137–147)

## 2013-10-04 LAB — CARBOXYHEMOGLOBIN
CARBOXYHEMOGLOBIN: 1 % (ref 0.5–1.5)
METHEMOGLOBIN: 0.6 % (ref 0.0–1.5)
O2 Saturation: 60.4 %
Total hemoglobin: 14 g/dL (ref 13.5–18.0)

## 2013-10-04 MED ORDER — POTASSIUM CHLORIDE CRYS ER 20 MEQ PO TBCR
40.0000 meq | EXTENDED_RELEASE_TABLET | Freq: Three times a day (TID) | ORAL | Status: DC
  Administered 2013-10-04 – 2013-10-08 (×12): 40 meq via ORAL
  Filled 2013-10-04 (×15): qty 2

## 2013-10-04 NOTE — Progress Notes (Signed)
  Date: 10/04/2013  Patient name: [name]  Medical record number: [id number]  Date of birth: January 27, 1965   This patient has been seen and the plan of care was discussed with the house staff. Please see their note for complete details. I concur with their findings with the following additions/corrections : weight is down 6 lbs. Creatinine still stable. Dr B might keep him here another day or two to cont diuresis prior to D/C.   Burns Spain, MD 10/04/2013, 11:32 AM

## 2013-10-04 NOTE — Progress Notes (Signed)
Inpatient RN visit- [facility] 2H Room 23-[facility] RN Visit-[name] RN  Related admission to [facility] diagnosis of Hypertrophic Cardiomyopathy. Pt is FULL code  code.   Pt seen lying in bed, awakened by voice and gentle touch, remained sleepy, reported he did "not sleep all night". Per chart review and staff RN Kenney Houseman, pt may be for discharge today. Briefly discussed with pt, he does not know how he will get home, reports his son is coming from [place] to visit today. Will need plan for transport prior to d/c.  Dr. Rogelia Boga and team in just after writer's visit, pt will not d/c today. Per their conversation with Dr. Gala Romney pt will continue on lasix gtt for continued diuresing prior to discharge.  CVP 17, wt down 6 lbs, much improved in the last 2 days. Pt has been eating mainly salads for lunch and dinner. He did require a PRN dose of morphine this am for headache and chest pain. Evaluation and medication adjustment for effective symptom management is on going. [facility] will continue to follow.  Patient's home medication list and transfer summary in place on shadow chart.  Please call [facility] @ [phone] with any hospice needs.   Thank you. Hansel Starling, RN  [facility]  Hospice Liaison  [phone])

## 2013-10-04 NOTE — Progress Notes (Signed)
Pt pleasantly declined walking, sts he is waiting on a phone call. Sts he will walk independently later. Ethelda Chick CES, ACSM 3:05 PM 10/04/2013

## 2013-10-04 NOTE — Progress Notes (Signed)
CARE MANAGEMENT NOTE 10/04/2013  Patient:  [name], [name]   Account Number:  [id number]  Date Initiated:  09/17/2013  Documentation initiated by:  Junius Creamer  Subjective/Objective Assessment:   adm w ch pain     Action/Plan:   pcp dr s Marilu Favre, lives w fam  [facility]   Anticipated DC Date:  10/05/2013   Anticipated DC Plan:  HOME W [facility] CARE  In-house referral  Clinical Social Worker  Hospice / Palliative Care      DC Planning Services  CM consult      [facility] Choice  Resumption Of Svcs/PTA Provider   Choice offered to / List presented to:          [facility] arranged  HH-1 RN  HH-6 SOCIAL WORKER      Status of service:  In process, will continue to follow Medicare Important Message given?  YES (If response is "NO", the following Medicare IM given date fields will be blank) Date Medicare IM given:  09/24/2013 Medicare IM given by:  Junius Creamer Date Additional Medicare IM given:  09/27/2013 Additional Medicare IM given by:  Junius Creamer  Discharge Disposition:  HOME W HOSPICE CARE  Per UR Regulation:  Reviewed for med. necessity/level of care/duration of stay  If discussed at Long Length of Stay Meetings, dates discussed:   09/20/2013  09/25/2013  09/27/2013  10/02/2013  10/04/2013    Comments:  [id number] Davis,RN,BSN,CCM: iv milrinone dcd on [id number] lasix infusion continues/diuresing well/poss dcd to home with hospice in the am.  Dayna Barker with [facility] notified.  [id number] Davis,RN,BSN,CCM: patient with complex history of heart failure due to polysubstance and etoh abuse/end stage heart failure and under hospice care/iv lasix and strict i&o for diuresis/pt c/o of continual pain/ abusive to staff and MD/outcome will be home with hospice or resdiental hospice.  9/1  1001 debbie dowell rn,bsn [facility] aware pt in hospital.

## 2013-10-04 NOTE — Progress Notes (Signed)
Advanced Heart Failure Rounding Note   Subjective:    [name] is a 48y/o male with HTN, DM2, polysunbstance abuse with longstanding HF due to NICM (probably ETOH), EF 10%. Current cocaine abuse. Was previously on [facility].   Admitted with ab pain & n/v/diarrhea. Weight up to 231.   Noncontrast CT ab: Diverticulosis of the colon with mild thickening of the sigmoid colon suggesting early diverticulitis. No abscess. Hepatic cirrhosis with mild ascites and edema throughout the subcutaneous soft tissues. Renal function stable.   Seems to be more compliant with dietary restriction. Lasix gtt at /hr. He is off milrinone and co-ox 60%. Volume status improving now. Weight down 6 pounds! CVP 12  Cr  1.9->2.0>1.98>2.1>2.18>2.09> 2.11> 2.06>1.95> 2.3> 2.45 >2.3   Objective:   Weight Range:  Vital Signs:   Temp:  [97.7 F (36.5 C)-98.4 F (36.9 C)] 98 F (36.7 C) (09/17 0830) Pulse Rate:  [95] 95 (09/17 0057) Resp:  [19-20] 20 (09/17 0800) BP: (93-111)/(68-72) 93/69 mmHg (09/17 1031) SpO2:  [92 %-95 %] 94 % (09/17 0830) Weight:  [96.798 kg (213 lb 6.4 oz)] 96.798 kg (213 lb 6.4 oz) (09/17 0400) Last BM Date: 10/02/13  Weight change: Filed Weights   10/02/13 0300 10/03/13 0311 10/04/13 0400  Weight: 99 kg (218 lb 4.1 oz) 99.6 kg (219 lb 9.3 oz) 96.798 kg (213 lb 6.4 oz)    Intake/Output:   Intake/Output Summary (Last 24 hours) at 10/04/13 1153 Last data filed at 10/04/13 1100  Gross per 24 hour  Intake    983 ml  Output   4650 ml  Net  -3667 ml     Physical Exam: CVP 17 General: Chronically ill appearing. No resp difficulty; Lying in bed.   HEENT: normal  Neck: supple. RIJ central line. JVP to jaw . Carotids 2+ bilaterally; no bruits. No lymphadenopathy or thryomegaly appreciated.  Cor: PMI normal. Tachy & regular rhythm. 2/6 systolic murmur LLSB. + S3  Lungs: clear  Abdomen: obese, soft, nontender, non distended. No hepatosplenomegaly. No bruits or masses. Good  bowel sounds.  Extremities: no cyanosis, clubbing, rash. R and LLE 1+ edema. No edema; both feet and 1st MTP sore to touch  Neuro: alert & orientedx3, cranial nerves grossly intact. Moves all 4 extremities w/o difficulty. Affect pleasant.when   Telemetry: ST PVCs 90s  Labs: Basic Metabolic Panel:  Recent Labs Lab 09/28/13 0500  09/30/13 0337 10/01/13 0436 10/02/13 0420 10/03/13 0445 10/04/13 0440  NA 129*  < > 128* 130* 127* 133* 133*  K 3.3*  < > 4.0 3.1* 3.7 3.5* 3.6*  CL 80*  < > 82* 83* 80* 84* 83*  CO2 35*  < > 31 32 32 30 32  GLUCOSE 154*  < > 292* 174* 179* 141* 192*  BUN 80*  < > 83* 87* 96* 100* 101*  CREATININE 2.09*  < > 2.06* 1.95* 2.30* 2.45* 2.23*  CALCIUM 9.1  < > 9.5 9.8 9.9 9.2 9.3  MG 2.4  --   --   --   --   --   --   < > = values in this interval not displayed.  Liver Function Tests: No results found for this basename: AST, ALT, ALKPHOS, BILITOT, PROT, ALBUMIN,  in the last 168 hours No results found for this basename: LIPASE, AMYLASE,  in the last 168 hours No results found for this basename: AMMONIA,  in the last 168 hours  CBC:  Recent Labs Lab 09/29/13 0429  WBC 4.7  HGB 13.9  HCT 42.2  MCV 84.1  PLT 209    Cardiac Enzymes: No results found for this basename: CKTOTAL, CKMB, CKMBINDEX, TROPONINI,  in the last 168 hours  BNP: BNP (last 3 results)  Recent Labs  07/12/13 1344 09/08/13 2224 09/14/13 1203  PROBNP 5773.0* 6632.0* 10023*     Other results:  EKG:   Imaging: No results found.   Medications:     Scheduled Medications: . allopurinol  100 mg Oral Daily  . aspirin EC  81 mg Oral Daily  . atorvastatin  80 mg Oral q1800  . bacitracin-neomycin-polymyxin-hydrocortisone   Topical BID  . famotidine  20 mg Oral Daily  . gabapentin  200 mg Oral TID  . heparin  5,000 Units Subcutaneous 3 times per day  . hydrALAZINE  50 mg Oral TID  . insulin aspart  0-20 Units Subcutaneous TID WC  . insulin aspart  0-5 Units  Subcutaneous QHS  . insulin aspart  7 Units Subcutaneous TID WC  . insulin glargine  20 Units Subcutaneous QHS  . isosorbide mononitrate  30 mg Oral BID  . metolazone  5 mg Oral BID  . potassium chloride  40 mEq Oral BID  . sodium chloride  3 mL Intravenous Q12H    Infusions: . furosemide (LASIX) infusion 20 mg/hr (10/04/13 0700)    PRN Medications: sodium chloride, albuterol, loperamide, LORazepam, morphine injection, ondansetron (ZOFRAN) IV, oxyCODONE, senna, sodium chloride   Assessment:   1. A/c systolic HF with low output physiology  --d/c weight in 6/15 was 206 pounds  2. NICM EF 10-15%  3. A/c renal failure, stage IV (baseline Cr ~2.0-2.5)  4. Hyperkalemia  5. NSVT  6. N/V/diarrhea -  7. H/o polysubstance abuse  8. Noncompliance  9. Hyponatremia/Hypokalemia 10. Acute gout 11. NSVT 12. Hyokalemia  Plan/Discussion:     Milrinone is now off. Co-ox stable. Diuresing well and weight coming down. He is more compliant. Would continue to get as dry as we can. Possible home in am.  Not candidate for home inotropes.   Lindzee Gouge,MD 11:53 AM

## 2013-10-04 NOTE — Progress Notes (Addendum)
Subjective: Patient complaining of pain and not sleeping well this morning. He states he has been avoiding oxycodone because it makes him groggy. His weight is down 6 lbs this AM (weight 213). Cr has improved to 2.23. CVP 14. Patient likely to go home tomorrow. Per Dr. Gala Romney, would like to diurese patient as much as possible before sending home.   Objective: Vital signs in last 24 hours: Filed Vitals:   10/03/13 1730 10/03/13 2000 10/04/13 0057 10/04/13 0400  BP: 107/70 98/69 104/68   Pulse:   95   Temp: 98.1 F (36.7 C) 97.7 F (36.5 C) 97.9 F (36.6 C) 98 F (36.7 C)  TempSrc: Oral Oral Oral Oral  Resp:   19   Height:      Weight:    213 lb 6.4 oz (96.798 kg)  SpO2: 94% 94% 95%    Weight change: -6 lb 2.9 oz (-2.802 kg)  Intake/Output Summary (Last 24 hours) at 10/04/13 0802 Last data filed at 10/04/13 0600  Gross per 24 hour  Intake    720 ml  Output   4050 ml  Net  -3330 ml   Physical Exam General: alert, cooperative, sitting up at bedside HEENT: [place]/AT, EOMI, mucus membranes moist CV: mildly tachycardic, 2/6 systolic murmur Pulm: CTA bilaterally, breaths non-labored Ext: 1+ pitting edema in lower extremities bilaterally Neuro: alert and oriented x 3, pleasant, CNs II- XII intact  Lab Results: Basic Metabolic Panel:  Recent Labs Lab 09/28/13 0500  10/03/13 0445 10/04/13 0440  NA 129*  < > 133* 133*  K 3.3*  < > 3.5* 3.6*  CL 80*  < > 84* 83*  CO2 35*  < > 30 32  GLUCOSE 154*  < > 141* 192*  BUN 80*  < > 100* 101*  CREATININE 2.09*  < > 2.45* 2.23*  CALCIUM 9.1  < > 9.2 9.3  MG 2.4  --   --   --   < > = values in this interval not displayed. Liver Function Tests: No results found for this basename: AST, ALT, ALKPHOS, BILITOT, PROT, ALBUMIN,  in the last 168 hours No results found for this basename: LIPASE, AMYLASE,  in the last 168 hours No results found for this basename: AMMONIA,  in the last 168 hours CBC:  Recent Labs Lab 09/29/13 0429    WBC 4.7  HGB 13.9  HCT 42.2  MCV 84.1  PLT 209   Cardiac Enzymes: No results found for this basename: CKTOTAL, CKMB, CKMBINDEX, TROPONINI,  in the last 168 hours BNP: No results found for this basename: PROBNP,  in the last 168 hours D-Dimer: No results found for this basename: DDIMER,  in the last 168 hours CBG:  Recent Labs Lab 10/03/13 0835 10/03/13 1233 10/03/13 1738 10/03/13 2145 10/03/13 2224 10/04/13 0457  GLUCAP 74 178* 320* 68* 98 152*   Hemoglobin A1C: No results found for this basename: HGBA1C,  in the last 168 hours Fasting Lipid Panel: No results found for this basename: CHOL, HDL, LDLCALC, TRIG, CHOLHDL, LDLDIRECT,  in the last 168 hours Thyroid Function Tests: No results found for this basename: TSH, T4TOTAL, FREET4, T3FREE, THYROIDAB,  in the last 168 hours Coagulation: No results found for this basename: LABPROT, INR,  in the last 168 hours Anemia Panel: No results found for this basename: VITAMINB12, FOLATE, FERRITIN, TIBC, IRON, RETICCTPCT,  in the last 168 hours Urine Drug Screen: Drugs of Abuse     Component Value Date/Time   LABOPIA  NONE DETECTED 09/14/2013 2359   LABOPIA NEG 02/01/2012 1356   COCAINSCRNUR NONE DETECTED 09/14/2013 2359   COCAINSCRNUR PPS 02/01/2012 1356   LABBENZ NONE DETECTED 09/14/2013 2359   LABBENZ PPS 02/01/2012 1356   LABBENZ NEG 07/09/2010 1522   AMPHETMU NONE DETECTED 09/14/2013 2359   AMPHETMU NEG 07/09/2010 1522   THCU NONE DETECTED 09/14/2013 2359   LABBARB NONE DETECTED 09/14/2013 2359   LABBARB NEG 02/01/2012 1356    Alcohol Level: No results found for this basename: ETH,  in the last 168 hours Urinalysis: No results found for this basename: COLORURINE, APPERANCEUR, LABSPEC, PHURINE, GLUCOSEU, HGBUR, BILIRUBINUR, KETONESUR, PROTEINUR, UROBILINOGEN, NITRITE, LEUKOCYTESUR,  in the last 168 hours   Micro Results: No results found for this or any previous visit (from the past 240 hour(s)). Studies/Results: No results  found. Medications: I have reviewed the patient's current medications. Scheduled Meds: . allopurinol  100 mg Oral Daily  . aspirin EC  81 mg Oral Daily  . atorvastatin  80 mg Oral q1800  . bacitracin-neomycin-polymyxin-hydrocortisone   Topical BID  . famotidine  20 mg Oral Daily  . gabapentin  200 mg Oral TID  . heparin  5,000 Units Subcutaneous 3 times per day  . hydrALAZINE  50 mg Oral TID  . insulin aspart  0-20 Units Subcutaneous TID WC  . insulin aspart  0-5 Units Subcutaneous QHS  . insulin aspart  7 Units Subcutaneous TID WC  . insulin glargine  20 Units Subcutaneous QHS  . isosorbide mononitrate  30 mg Oral BID  . metolazone  5 mg Oral BID  . potassium chloride  40 mEq Oral BID  . sodium chloride  3 mL Intravenous Q12H   Continuous Infusions: . furosemide (LASIX) infusion 20 mg/hr (10/03/13 1900)   PRN Meds:.sodium chloride, albuterol, loperamide, LORazepam, morphine injection, ondansetron (ZOFRAN) IV, oxyCODONE, senna, sodium chloride Assessment/Plan: Mr. Canela is a 48yo man w/ PMHx of poorly controlled Type 2 DM, CKD Stage 3, chronic systolic non-ischemic cardiomyopathy with AICD (EF 10%) 2/2 polysubstance abuse (alcohol, cocaine) who was admitted for abdominal pain, nausea, vomiting, and diarrhea and cardiorenal syndrome.   1. Cardiorenal syndrome: CHF complicated by CKD. Weight down 6 lbs today, at 213 lbs. Baseline weight appears to be ~205 lbs. Patient has not been compliant with his diet and not watching fluid intake, but has been making some effort. Lasix drip was restarted on 9/11, at 20 mg. He has diuresed 3.3 L in last 24 hours, -43.0 L since admission. Cr 2.23 today, down from 2.45 yesterday. CVP 14 today. Continue to stress importance of restricting fluid intake and avoiding salt. Patient previously on Hospice care, would be best option for him at this point. Appreciate hospice following with him.  - Heart Failure team following, appreciate recs  - Continue  Hydralazine 50 mg TID  - Continue Metolazone 5 mg BID  - Pt off Milrinone for 72 hours. Not a home Milrinone candidate due to noncompliance and drug abuse (cocaine)  - Lasix drip per HF, continue 20 mg  - Continue Imdur 30 mg daily  - KDur 40 mg po BID  - Keep central line to access CVP and CO-Hb  - Avoid beta blocker due to low output  - Avoid ACE- inhibitor due to acute renal failure  - Continue strict I&Os  - Daily weights   2. Acute gout flare: Elbow pain has improved. Pt received 3 days of Prednisone 40 mg, which was completed on 9/1 for elbow pain. Patient has  pain in his feet, especially the 1st MTPs, but improved since completion of second 3-day course of Prednisone 40 mg on 9/14. Not giving Colchicine 2/2 renal function. Uric acid level elevated at 11.7.  - Continue Allopurinol 100 mg daily, will likely need to increase  - Continue Gabapentin 200 mg TID   3. Type 2 DM: Pt's blood sugars have in 70s-180s. Likely improved since patient making an effort to be more compliant with diet and mealtime Novolog increased to 7 units TID.  - Continue Lantus 20 units QHS  - Continue Novolog 7 units TID with meals  - Continue insulin sliding scale   4. HTN: BP stable.  - Continue to monitor  - Continue Hydralazine 50 mg TID  - Continue Lasix drip as above  - Continue Metolazone 5 mg daily   5. Hyponatremia- Resolving: Patient asymptomatic. Patient's sodium was 125 on 9/1 and he received 1 dose of Tolvaptan 30 mg. Na improving again to 133, stable for last 2 days. Hyponatremia likely 2/2 hypervolemia. Continue to diurese.  - AM BMP   6. CAD: Continue home atorvastatin 80 mg daily and ASA 81 mg daily.   7.GERD:  - Continue famotidine 20mg    8. Asthma:  - Continue albuterol 2.5mg  q2h prn for wheezing.   Diet: Heart Healthy/Carb modified  DVT/PE PPx: Heparin SQ  Dispo: Likely discharge tomorrow or following day.  .   The patient does have a current PCP Baltazar Apo, MD) and  does need an [facility] hospital follow-up appointment after discharge.  The patient does not have transportation limitations that hinder transportation to clinic appointments.  .Services Needed at time of discharge: Y = Yes, Blank = No PT:   OT:   RN:   Equipment:   Other:     LOS: 20 days   Rich Number, MD 10/04/2013, 8:02 AM

## 2013-10-05 LAB — GLUCOSE, CAPILLARY
GLUCOSE-CAPILLARY: 126 mg/dL — AB (ref 70–99)
GLUCOSE-CAPILLARY: 345 mg/dL — AB (ref 70–99)
Glucose-Capillary: 89 mg/dL (ref 70–99)
Glucose-Capillary: 213 mg/dL — ABNORMAL HIGH (ref 70–99)

## 2013-10-05 LAB — CARBOXYHEMOGLOBIN
Carboxyhemoglobin: 1.3 % (ref 0.5–1.5)
Methemoglobin: 0.8 % (ref 0.0–1.5)
O2 Saturation: 48.2 %
Total hemoglobin: 14.7 g/dL (ref 13.5–18.0)

## 2013-10-05 LAB — BASIC METABOLIC PANEL
ANION GAP: 17 — AB (ref 5–15)
BUN: 103 mg/dL — ABNORMAL HIGH (ref 6–23)
CHLORIDE: 81 meq/L — AB (ref 96–112)
CO2: 33 mEq/L — ABNORMAL HIGH (ref 19–32)
Calcium: 9.6 mg/dL (ref 8.4–10.5)
Creatinine, Ser: 2.33 mg/dL — ABNORMAL HIGH (ref 0.50–1.35)
GFR calc Af Amer: 37 mL/min — ABNORMAL LOW (ref >90)
GFR, EST NON AFRICAN AMERICAN: 32 mL/min — AB (ref >90)
Glucose, Bld: 182 mg/dL — ABNORMAL HIGH (ref 70–99)
POTASSIUM: 4.2 meq/L (ref 3.7–5.3)
SODIUM: 131 meq/L — AB (ref 137–147)

## 2013-10-05 MED ORDER — PREDNISONE 20 MG PO TABS
40.0000 mg | ORAL_TABLET | Freq: Every day | ORAL | Status: AC
  Administered 2013-10-06 – 2013-10-07 (×2): 40 mg via ORAL
  Filled 2013-10-05 (×2): qty 2

## 2013-10-05 MED ORDER — PREDNISONE 20 MG PO TABS
40.0000 mg | ORAL_TABLET | Freq: Once | ORAL | Status: AC
  Administered 2013-10-05: 40 mg via ORAL
  Filled 2013-10-05 (×2): qty 2

## 2013-10-05 MED ORDER — ALLOPURINOL 100 MG PO TABS
200.0000 mg | ORAL_TABLET | Freq: Every day | ORAL | Status: DC
  Administered 2013-10-06 – 2013-10-08 (×3): 200 mg via ORAL
  Filled 2013-10-05 (×3): qty 2

## 2013-10-05 NOTE — Progress Notes (Addendum)
Advanced Heart Failure Rounding Note   Subjective:    Mr. Schleifer is a 48y/o male with HTN, DM2, polysunbstance abuse with longstanding HF due to NICM (probably ETOH), EF 10%. Current cocaine abuse. Was previously on [facility].   Admitted with ab pain & n/v/diarrhea. Weight up to 231.   Noncontrast CT ab: Diverticulosis of the colon with mild thickening of the sigmoid colon suggesting early diverticulitis. No abscess. Hepatic cirrhosis with mild ascites and edema throughout the subcutaneous soft tissues. Renal function stable.   Seems to be more compliant with dietary restriction. Lasix gtt at /hr. He is off milrinone and co-ox down to 48%. Weight stable and 24 hr I/O -2.7 liters. Ambulating in the halls. CVP 13.   Cr 2.1>2.18>2.09> 2.11> 2.06>1.95> 2.3> 2.45 >2.23>2.33   Objective:   Weight Range:  Vital Signs:   Temp:  [97.6 F (36.4 C)-98.4 F (36.9 C)] 98.1 F (36.7 C) (09/18 0300) Pulse Rate:  [92-100] 100 (09/18 0300) Resp:  [18-20] 20 (09/18 0300) BP: (93-106)/(63-75) 97/69 mmHg (09/18 0300) SpO2:  [93 %-100 %] 100 % (09/18 0300) Weight:  [213 lb 1.6 oz (96.662 kg)] 213 lb 1.6 oz (96.662 kg) (09/18 0300) Last BM Date: 10/04/13  Weight change: Filed Weights   10/03/13 0311 10/04/13 0400 10/05/13 0300  Weight: 219 lb 9.3 oz (99.6 kg) 213 lb 6.4 oz (96.798 kg) 213 lb 1.6 oz (96.662 kg)    Intake/Output:   Intake/Output Summary (Last 24 hours) at 10/05/13 0721 Last data filed at 10/05/13 0700  Gross per 24 hour  Intake   1343 ml  Output   4020 ml  Net  -2677 ml     Physical Exam: CVP 13 General: Chronically ill appearing. No resp difficulty; Lying in bed.  Lethargic HEENT: normal  Neck: supple. RIJ central line. JVP to jaw . Carotids 2+ bilaterally; no bruits. No lymphadenopathy or thryomegaly appreciated.  Cor: PMI normal. Tachy & regular rhythm. 2/6 systolic murmur LLSB. + S3  Lungs: clear  Abdomen: obese, soft, nontender, non distended. No  hepatosplenomegaly. No bruits or masses. Good bowel sounds.  Extremities: no cyanosis, clubbing, rash. R and LLE 1+ edema. No edema; both feet and 1st MTP sore to touch  Neuro: alert & orientedx3, cranial nerves grossly intact. Moves all 4 extremities w/o difficulty. Affect pleasant.when   Telemetry: ST PVCs 90s  Labs: Basic Metabolic Panel:  Recent Labs Lab 10/01/13 0436 10/02/13 0420 10/03/13 0445 10/04/13 0440 10/05/13 0344  NA 130* 127* 133* 133* 131*  K 3.1* 3.7 3.5* 3.6* 4.2  CL 83* 80* 84* 83* 81*  CO2 32 32 30 32 33*  GLUCOSE 174* 179* 141* 192* 182*  BUN 87* 96* 100* 101* 103*  CREATININE 1.95* 2.30* 2.45* 2.23* 2.33*  CALCIUM 9.8 9.9 9.2 9.3 9.6    Liver Function Tests: No results found for this basename: AST, ALT, ALKPHOS, BILITOT, PROT, ALBUMIN,  in the last 168 hours No results found for this basename: LIPASE, AMYLASE,  in the last 168 hours No results found for this basename: AMMONIA,  in the last 168 hours  CBC:  Recent Labs Lab 09/29/13 0429  WBC 4.7  HGB 13.9  HCT 42.2  MCV 84.1  PLT 209    Cardiac Enzymes: No results found for this basename: CKTOTAL, CKMB, CKMBINDEX, TROPONINI,  in the last 168 hours  BNP: BNP (last 3 results)  Recent Labs  07/12/13 1344 09/08/13 2224 09/14/13 1203  PROBNP 5773.0* 6632.0* 10023*     Other  results:  EKG:   Imaging: No results found.   Medications:     Scheduled Medications: . allopurinol  100 mg Oral Daily  . aspirin EC  81 mg Oral Daily  . atorvastatin  80 mg Oral q1800  . bacitracin-neomycin-polymyxin-hydrocortisone   Topical BID  . famotidine  20 mg Oral Daily  . gabapentin  200 mg Oral TID  . heparin  5,000 Units Subcutaneous 3 times per day  . hydrALAZINE  50 mg Oral TID  . insulin aspart  0-20 Units Subcutaneous TID WC  . insulin aspart  0-5 Units Subcutaneous QHS  . insulin aspart  7 Units Subcutaneous TID WC  . insulin glargine  20 Units Subcutaneous QHS  . isosorbide  mononitrate  30 mg Oral BID  . metolazone  5 mg Oral BID  . potassium chloride  40 mEq Oral TID  . sodium chloride  3 mL Intravenous Q12H    Infusions: . furosemide (LASIX) infusion 20 mg/hr (10/04/13 1852)    PRN Medications: sodium chloride, albuterol, loperamide, LORazepam, morphine injection, ondansetron (ZOFRAN) IV, oxyCODONE, senna, sodium chloride   Assessment:   1. A/c systolic HF with low output physiology  --d/c weight in 6/15 was 206 pounds  2. NICM EF 10-15%  3. A/c renal failure, stage IV (baseline Cr ~2.0-2.5)  4. Hyperkalemia  5. NSVT  6. N/V/diarrhea -  7. H/o polysubstance abuse  8. Noncompliance  9. Hyponatremia/Hypokalemia 10. Acute gout 11. NSVT 12. Hyokalemia  Plan/Discussion:    Patient seen and examined with Ulla Potash, NP. We discussed all aspects of the encounter. I agree with the assessment and plan as stated above.   Milrinone is now off and co-ox back down to 48%. He feels miserable and looks like he has low output. (Was able to walk yesterday). Weight stable at 213 (previous dry weight 208--211). Unfortunately we cannot stabilize him off inotropes and he is not candidate for home milrinone.   Unfortunately I do not think we will be optimize him enough to get him home off milrinone and thus I think only option may be to transfer to [facility]. From a diuretic standpoint can try to convert to torsemide 60 bid with metolazone 2.5 at least once weekly and as needed for weight gain.   Daniel Bensimhon,MD 7:40 AM

## 2013-10-05 NOTE — Progress Notes (Signed)
RM 2H23C  [name]                   [facility] SW Note:  Pt informed that he may be discharged soon. His desires to return home not a SNF, not BP. He desires to attempt to see if he can manage at home. Encouraged him to call on friends to assist once he returns. He is uncertain if they will be available to do so. Explained that there is no around the clock help available through hospice to assist at home. The other issue is that pt does not want to lose his apartment if he is admitted to a facility. Pt has requested a wheelchair for use at home. Hospice will attempt to provide aide services and volunteer services once pt has returned home. Clarified with pt that these services are not available around the clock to provide care and assistance. Pt will also need transportation arranged when he is discharged home. Exie Parody [place],LCSW

## 2013-10-05 NOTE — Progress Notes (Signed)
  Date: 10/05/2013  Patient name: [name]  Medical record number: [id number]  Date of birth: November 07, 1965   This patient has been seen and the plan of care was discussed with the house staff. Please see their note for complete details. I concur with their findings. Add prednisone today to see if helps with R foot pain.   Burns Spain, MD 10/05/2013, 5:00 PM

## 2013-10-05 NOTE — Progress Notes (Addendum)
Inpatient RN visit-[facility] 2H Room 23 -[facility] RN Visit-[name] RN  Related admission to [facility] diagnosis of Hypertrophic Cardiomyopathy.  Pt is FULL code.  Pt seen lying in bed, just recvd morphine for R foot pain d/t gout reoccurance.  Reported severe pain in the foot, unable to stand or walk on it today. Per chart review prednisone has been restarted, as pt reports no relief from allopurinol for gout flare.  He has required 2 doses of PRN morphine 2mg  IV and one dose of oxycodone 10mg  po for pain management today. He  remains on the Lasix gtt, continuous at 20mg /hr. Wt stable and CVP 13 this am.  Evaluation and medication adjustment for effective symptom management is on going. Per pt and staff RN Josh no discharge planned over the weekend at this time. [facility] will continue to follow.  Patient's home medication list and transfer summary in place  on shadow chart.  Please call [facility] @ [phone]-  with any hospice needs.   Thank you. Hansel Starling, RN  [facility]  Hospice Liaison  [phone])

## 2013-10-05 NOTE — Progress Notes (Signed)
CARE MANAGEMENT NOTE 10/05/2013  Patient:  [name], [name]   Account Number:  [id number]  Date Initiated:  09/17/2013  Documentation initiated by:  Junius Creamer  Subjective/Objective Assessment:   adm w ch pain     Action/Plan:   pcp dr s Marilu Favre, lives w fam  act w [facility]   Anticipated DC Date:  10/05/2013   Anticipated DC Plan:  HOME W [facility] CARE  In-house referral  Clinical Social Worker  Hospice / Palliative Care      DC Planning Services  CM consult      [facility] Choice  Resumption Of Svcs/PTA Provider   Choice offered to / List presented to:          [facility] arranged  HH-1 RN  HH-6 SOCIAL WORKER      Status of service:  In process, will continue to follow Medicare Important Message given?  YES (If response is "NO", the following Medicare IM given date fields will be blank) Date Medicare IM given:  09/24/2013 Medicare IM given by:  Junius Creamer Date Additional Medicare IM given:  09/27/2013 Additional Medicare IM given by:  Junius Creamer  Discharge Disposition:  HOME W HOSPICE CARE  Per UR Regulation:  Reviewed for med. necessity/level of care/duration of stay  If discussed at Long Length of Stay Meetings, dates discussed:   09/20/2013  09/25/2013  09/27/2013  10/02/2013  10/04/2013    Comments:  [id number] Davis,RN,BSN,CCM: patient does not want to go to snf nor [facility] wants to go home/spoke with Clydie [name] with hospice/they will try to piece meal together what he needs if he is allowed to go home.  [id number]/[id number] Davis,RN,BSN,CCM: iv milrinone dcd on [id number] Lasix infusion continues/diuresing well/poss dcd to home with hospice in the am.  Dayna Barker with [facility] notified.  [id number] Davis,RN,BSN,CCM: patient with complex history of heart failure due to polysubstance and etoh abuse/end stage heart failure and under hospice care/iv lasix and strict i&o for diuresis/pt c/o of continual pain/ abusive to staff and MD/outcome will be home  with hospice or resdiental hospice.  9/1  1001 debbie dowell rn,bsn [facility] aware pt in hospital.

## 2013-10-05 NOTE — Progress Notes (Signed)
Subjective: Patient reports he does not want to go to [facility], he wants to go home. [place] with LCSW was present in the room when I saw the patient and states Hospice is trying to helps set up aide services and volunteer services at home for the patient. Patient will stay over weekend as we optimize him to go home. Per HF Team, he is not stable off of inotropes and is not a candidate for home milrinone. Weight stable at 213 lbs.   He complains of severe right foot pain this morning. He states he is not able to get up walk around on his foot because the pain is so severe. Pain only minimally relieved with morphine.   Objective: Vital signs in last 24 hours: Filed Vitals:   10/04/13 1900 10/04/13 2300 10/05/13 0300 10/05/13 0814  BP: 102/71 106/63 97/69 122/95  Pulse: 92 96 100   Temp: 98.2 F (36.8 C) 98.4 F (36.9 C) 98.1 F (36.7 C) 97.9 F (36.6 C)  TempSrc: Oral Oral Oral Oral  Resp: 18 18 20 18   Height:      Weight:   213 lb 1.6 oz (96.662 kg)   SpO2: 97% 98% 100% 97%   Weight change: -4.8 oz (-0.136 kg)  Intake/Output Summary (Last 24 hours) at 10/05/13 1115 Last data filed at 10/05/13 0700  Gross per 24 hour  Intake   1060 ml  Output   3420 ml  Net  -2360 ml   Physical Exam General: laying in bed, appears uncomfortable, Child psychotherapist at bedside HEENT: /AT, EOMI, sclera anicteric, mucus membranes moist Neck: JVD to jawline CV: tachycardic, 2/6 systolic murmur Pulm: CTA bilaterally, breaths non-labored Abd: BS+, soft, non-tender Ext: Right foot is swollen and erythema present along medial side, significant tenderness to palpation. Lower extremities have 1+ pitting edema.  Neuro: alert and oriented x 3,   Lab Results: Basic Metabolic Panel:  Recent Labs Lab 10/04/13 0440 10/05/13 0344  NA 133* 131*  K 3.6* 4.2  CL 83* 81*  CO2 32 33*  GLUCOSE 192* 182*  BUN 101* 103*  CREATININE 2.23* 2.33*  CALCIUM 9.3 9.6   Liver Function Tests: No results  found for this basename: AST, ALT, ALKPHOS, BILITOT, PROT, ALBUMIN,  in the last 168 hours No results found for this basename: LIPASE, AMYLASE,  in the last 168 hours No results found for this basename: AMMONIA,  in the last 168 hours CBC:  Recent Labs Lab 09/29/13 0429  WBC 4.7  HGB 13.9  HCT 42.2  MCV 84.1  PLT 209   Cardiac Enzymes: No results found for this basename: CKTOTAL, CKMB, CKMBINDEX, TROPONINI,  in the last 168 hours BNP: No results found for this basename: PROBNP,  in the last 168 hours D-Dimer: No results found for this basename: DDIMER,  in the last 168 hours CBG:  Recent Labs Lab 10/04/13 0457 10/04/13 0828 10/04/13 1234 10/04/13 1638 10/04/13 2129 10/05/13 0811  GLUCAP 152* 180* 124* 127* 173* 126*   Hemoglobin A1C: No results found for this basename: HGBA1C,  in the last 168 hours Fasting Lipid Panel: No results found for this basename: CHOL, HDL, LDLCALC, TRIG, CHOLHDL, LDLDIRECT,  in the last 168 hours Thyroid Function Tests: No results found for this basename: TSH, T4TOTAL, FREET4, T3FREE, THYROIDAB,  in the last 168 hours Coagulation: No results found for this basename: LABPROT, INR,  in the last 168 hours Anemia Panel: No results found for this basename: VITAMINB12, FOLATE, FERRITIN, TIBC, IRON,  RETICCTPCT,  in the last 168 hours Urine Drug Screen: Drugs of Abuse     Component Value Date/Time   LABOPIA NONE DETECTED 09/14/2013 2359   LABOPIA NEG 02/01/2012 1356   COCAINSCRNUR NONE DETECTED 09/14/2013 2359   COCAINSCRNUR PPS 02/01/2012 1356   LABBENZ NONE DETECTED 09/14/2013 2359   LABBENZ PPS 02/01/2012 1356   LABBENZ NEG 07/09/2010 1522   AMPHETMU NONE DETECTED 09/14/2013 2359   AMPHETMU NEG 07/09/2010 1522   THCU NONE DETECTED 09/14/2013 2359   LABBARB NONE DETECTED 09/14/2013 2359   LABBARB NEG 02/01/2012 1356    Alcohol Level: No results found for this basename: ETH,  in the last 168 hours Urinalysis: No results found for this basename:  COLORURINE, APPERANCEUR, LABSPEC, PHURINE, GLUCOSEU, HGBUR, BILIRUBINUR, KETONESUR, PROTEINUR, UROBILINOGEN, NITRITE, LEUKOCYTESUR,  in the last 168 hours   Micro Results: No results found for this or any previous visit (from the past 240 hour(s)). Studies/Results: No results found. Medications: I have reviewed the patient's current medications. Scheduled Meds: . allopurinol  100 mg Oral Daily  . aspirin EC  81 mg Oral Daily  . atorvastatin  80 mg Oral q1800  . bacitracin-neomycin-polymyxin-hydrocortisone   Topical BID  . famotidine  20 mg Oral Daily  . gabapentin  200 mg Oral TID  . heparin  5,000 Units Subcutaneous 3 times per day  . hydrALAZINE  50 mg Oral TID  . insulin aspart  0-20 Units Subcutaneous TID WC  . insulin aspart  0-5 Units Subcutaneous QHS  . insulin aspart  7 Units Subcutaneous TID WC  . insulin glargine  20 Units Subcutaneous QHS  . isosorbide mononitrate  30 mg Oral BID  . metolazone  5 mg Oral BID  . potassium chloride  40 mEq Oral TID  . sodium chloride  3 mL Intravenous Q12H   Continuous Infusions: . furosemide (LASIX) infusion 20 mg/hr (10/05/13 0834)   PRN Meds:.sodium chloride, albuterol, loperamide, LORazepam, morphine injection, ondansetron (ZOFRAN) IV, oxyCODONE, senna, sodium chloride Assessment/Plan: Mr. Seyfried is a 48yo man w/ PMHx of poorly controlled Type 2 DM, CKD Stage 3, chronic systolic non-ischemic cardiomyopathy with AICD (EF 10%) 2/2 polysubstance abuse (alcohol, cocaine) who was admitted for abdominal pain, nausea, vomiting, and diarrhea and cardiorenal syndrome.   1. Cardiorenal syndrome: CHF complicated by CKD. Weight down 6 lbs yesterday, stable at 213 lbs. Baseline weight appears to be ~205 lbs. Patient previously not compliant with his diet and not watching fluid intake, but has been making some effort. Lasix drip was restarted on 9/11, at 20 mg. He has diuresed 2.6 L in last 24 hours, -45.7 L since admission. Cr 2.33 today, up from  2.23 yesterday. CVP 13 yesterday. Continue to stress importance of restricting fluid intake and avoiding salt. Patient previously on Hospice care, would be best option for him at this point. Appreciate hospice following with him. However, patient does not want hospice. He states he just wants to go home. Will discuss code status for him.  - Heart Failure team following, appreciate recs  - Continue Hydralazine 50 mg TID  - Continue Metolazone 5 mg BID  - Pt off Milrinone. Not a home Milrinone candidate due to noncompliance and drug abuse (cocaine)  - Lasix drip per HF, continue 20 mg  - Continue Imdur 30 mg daily  - KDur 40 mg po BID  - Keep central line to access CVP and CO-Hb  - Avoid beta blocker due to low output  - Avoid ACE- inhibitor due to  acute renal failure  - Continue strict I&Os  - Daily weights   2. Acute gout flare: Elbow pain has improved. Pt received 3 days of Prednisone 40 mg, which was completed on 9/1 for elbow pain. Patient had pain in his feet, especially the 1st MTPs, but improved since completion of second 3-day course of Prednisone 40 mg on 9/14. Patient now complaining of severe pain in his right foot. Foot is very swollen and erythematous along medial side. Not giving Colchicine 2/2 renal function. Uric acid level elevated at 11.7.  - Increase Allopurinol to 200 mg daily, max for renal dosing - Start Prednisone 40 mg daily for 3 days - Continue Gabapentin 200 mg TID   3. Type 2 DM: Pt's blood sugars have in 120s-180s. Likely improved since patient making an effort to be more compliant with diet and mealtime Novolog increased to 7 units TID. Will need to monitor sugars now that on Prednisone again.  - Continue Lantus 20 units QHS  - Continue Novolog 7 units TID with meals  - Continue insulin sliding scale   4. HTN: BP stable.  - Continue to monitor  - Continue Hydralazine 50 mg TID  - Continue Lasix drip as above  - Continue Metolazone 5 mg daily   5.  Hyponatremia- Stable: Patient asymptomatic. Patient's sodium was 125 on 9/1 and he received 1 dose of Tolvaptan 30 mg. Na improved to 133, has been stable in 128-130 range. Hyponatremia likely 2/2 hypervolemia. Continue to diurese.  - AM BMP   6. CAD: Continue home atorvastatin 80 mg daily and ASA 81 mg daily.   7.GERD:  - Continue famotidine    8. Asthma:  - Continue albuterol 2.5mg  q2h prn for wheezing.   Diet: Heart Healthy/Carb modified  DVT/PE PPx: Heparin SQ  Dispo: Likely discharge tomorrow or following day.  .   The patient does have a current PCP Baltazar Apo, MD) and does need an [facility] hospital follow-up appointment after discharge.  The patient does not have transportation limitations that hinder transportation to clinic appointments.  .Services Needed at time of discharge: Y = Yes, Blank = No PT:   OT:   RN:   Equipment:   Other:     LOS: 21 days   Rich Number, MD 10/05/2013, 11:15 AM

## 2013-10-06 LAB — CARBOXYHEMOGLOBIN
CARBOXYHEMOGLOBIN: 1.8 % — AB (ref 0.5–1.5)
Methemoglobin: 0.8 % (ref 0.0–1.5)
O2 Saturation: 66.2 %
TOTAL HEMOGLOBIN: 14 g/dL (ref 13.5–18.0)

## 2013-10-06 LAB — BASIC METABOLIC PANEL
Anion gap: 16 — ABNORMAL HIGH (ref 5–15)
BUN: 109 mg/dL — ABNORMAL HIGH (ref 6–23)
CALCIUM: 9.7 mg/dL (ref 8.4–10.5)
CO2: 34 mEq/L — ABNORMAL HIGH (ref 19–32)
Chloride: 79 mEq/L — ABNORMAL LOW (ref 96–112)
Creatinine, Ser: 2.34 mg/dL — ABNORMAL HIGH (ref 0.50–1.35)
GFR calc Af Amer: 37 mL/min — ABNORMAL LOW (ref >90)
GFR, EST NON AFRICAN AMERICAN: 32 mL/min — AB (ref >90)
Glucose, Bld: 241 mg/dL — ABNORMAL HIGH (ref 70–99)
POTASSIUM: 3.9 meq/L (ref 3.7–5.3)
SODIUM: 129 meq/L — AB (ref 137–147)

## 2013-10-06 LAB — GLUCOSE, CAPILLARY
GLUCOSE-CAPILLARY: 251 mg/dL — AB (ref 70–99)
GLUCOSE-CAPILLARY: 511 mg/dL — AB (ref 70–99)
Glucose-Capillary: 291 mg/dL — ABNORMAL HIGH (ref 70–99)

## 2013-10-06 MED ORDER — SODIUM CHLORIDE 0.9 % IJ SOLN: 10.0000 mL | INTRAMUSCULAR | Status: DC | PRN

## 2013-10-06 MED ORDER — INSULIN ASPART 100 UNIT/ML ~~LOC~~ SOLN
8.0000 [IU] | Freq: Three times a day (TID) | SUBCUTANEOUS | Status: DC
  Administered 2013-10-06 – 2013-10-07 (×4): 8 [IU] via SUBCUTANEOUS

## 2013-10-06 MED ORDER — ALTEPLASE 2 MG IJ SOLR
2.0000 mg | Freq: Once | INTRAMUSCULAR | Status: DC
  Filled 2013-10-06: qty 2

## 2013-10-06 MED ORDER — SODIUM CHLORIDE 0.9 % IJ SOLN
10.0000 mL | Freq: Two times a day (BID) | INTRAMUSCULAR | Status: DC
  Administered 2013-10-06: 10 mL

## 2013-10-06 MED ORDER — GABAPENTIN 100 MG PO CAPS
200.0000 mg | ORAL_CAPSULE | Freq: Three times a day (TID) | ORAL | Status: DC
  Administered 2013-10-06 – 2013-10-08 (×5): 200 mg via ORAL
  Filled 2013-10-06 (×7): qty 2

## 2013-10-06 MED ORDER — HYDRALAZINE HCL 50 MG PO TABS
50.0000 mg | ORAL_TABLET | Freq: Three times a day (TID) | ORAL | Status: DC
  Administered 2013-10-06 – 2013-10-08 (×5): 50 mg via ORAL
  Filled 2013-10-06 (×7): qty 1

## 2013-10-06 NOTE — Progress Notes (Signed)
    No new recs.  Please see note by Dr. Gala Romney from 9/18. Unfortunately, there are no good options for improving his CHf.  Not a candidate for home Milrinone given his hx of cocaine use.  Hospice has been recommended but he wants to go home - apparently without hospice. Will follow up with Dr. Gala Romney as needed.   [name], [name]., MD, [facility] 10/06/2013, 11:21 AM [address]  [address] Office [phone] Pager [phone]

## 2013-10-06 NOTE — Discharge Summary (Signed)
Name: [name] MRN: [id number] DOB: [birth year] 48 y.o. PCP: Baltazar Apo, MD  Date of Admission: 09/14/2013  1:12 PM Date of Discharge: 10/08/2013 Attending Physician: Blanch Media, MD  Discharge Diagnosis: 1.  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     aspirin 81 MG EC tablet  Take 1 tablet (81 mg total) by mouth daily.     atorvastatin 40 MG tablet  Commonly known as:  LIPITOR  Take 80 mg by mouth daily.     gabapentin 100 MG capsule  Commonly known as:  NEURONTIN  Take 2 capsules (200 mg total) by mouth 3 (three) times daily.     hydrALAZINE 50 MG tablet  Commonly known as:  APRESOLINE  Take 1 tablet (50 mg total) by mouth 3 (three) times daily.     insulin aspart 100 UNIT/ML injection  Commonly known as:  novoLOG  Inject 9 Units into the skin 3 (three) times daily with meals.     insulin glargine 100 UNIT/ML injection  Commonly known as:  LANTUS  Inject 0.2 mLs (20 Units total) into the skin at bedtime.     Insulin Syringes (Disposable) U-100 1 ML Misc  Commonly known as:  B-D INSULIN SYRINGE 1CC  Use for insulin     isosorbide mononitrate 30 MG 24 hr tablet  Commonly known as:  IMDUR  Take 1 tablet (30 mg total) by mouth 2 (two) times daily.     LORazepam 1 MG tablet  Commonly known as:   ATIVAN  Take 1 mg by mouth 2 (two) times daily as needed for anxiety or sleep.     metolazone 2.5 MG tablet  Commonly known as:  ZAROXOLYN  Take 2.5 mg by mouth once a week. On Wednesdays     ondansetron 4 MG tablet  Commonly known as:  ZOFRAN  Take 1 tablet (4 mg total) by mouth every 8 (eight) hours as needed for nausea or vomiting.     Oxycodone HCl 10 MG Tabs  Take 10 mg by mouth every 8 (eight) hours as needed (pain).     potassium chloride SA 20 MEQ tablet  Commonly known as:  K-DUR,KLOR-CON  Take 60 mEq by mouth See admin instructions. Take 3 tablets (60 meq) two times daily, take an extra tablet (20 meq) on Wednesdays with metolazone     ranitidine 150 MG tablet  Commonly known as:  ZANTAC  Take 150 mg by mouth daily.     torsemide 20 MG tablet  Commonly known as:  DEMADEX  Take 60 mg by mouth 2 (two) times daily.        Disposition and follow-up:   [name] was discharged  from [facility] in Stable condition.  At the hospital follow up visit please address:  1.  Cardiac meds, compliance with low-sodium diet  2.  Labs / imaging needed at time of follow-up: bmet  3.  Pending labs/ test needing follow-up: None  Follow-up Appointments: Follow-up Information   Follow up with Arvilla Meres, MD On 10/15/2013. (at 8:45 Garage Code [id number])    Specialty:  Cardiology   Contact information:   [address] [place] [address] [phone]       Follow up with Ky Barban, MD. (Appt Sept 29 @ 10:45AM)    Specialty:  Internal Medicine   Contact information:   [address] [place] [address] [phone]       Discharge Instructions: Discharge Instructions   Diet - low sodium heart healthy    Complete by:  As directed      Increase activity slowly    Complete by:  As directed            Consultations: Treatment Team:  Rounding L[facility]cardiology, MD  Procedures Performed:  Ct Abdomen Pelvis Wo  Contrast  09/14/2013   CLINICAL DATA:  Nausea and vomiting for 4 weeks. Diarrhea starting 5 days ago. Some vomiting of blood. Left lower quadrant abdominal pain.  EXAM: CT ABDOMEN AND PELVIS WITHOUT CONTRAST  TECHNIQUE: Multidetector CT imaging of the abdomen and pelvis was performed following the standard protocol without IV contrast.  COMPARISON:  08/15/2009  FINDINGS: Mild atelectasis in the lung bases.  Cardiac enlargement.  Probable hepatic cirrhosis with enlarged left lateral segment and nodular contour. Small amount of ascites in the upper abdomen around the liver, extending along both pericolic gutters, and into the pelvis. Increased density in the gallbladder suggesting sludge. Unenhanced appearance of the spleen, pancreas, adrenal glands, kidneys, abdominal aorta, inferior vena cava, and retroperitoneal lymph nodes is unremarkable stomach and small bowel are unremarkable. Contrast material flows through to the colon without evidence of obstruction. Colon is not abnormally distended. Edema throughout the subcutaneous fat. Mild mesenteric edema. No free air in the abdomen.  Pelvis: Wall thickening in the sigmoid colon with diverticulosis. Probable early diverticulitis. No abscess. No significant pelvic lymphadenopathy. Prostate gland is not enlarged. Mild degenerative changes in the spine. No destructive bone lesions.  IMPRESSION: Diverticulosis of the colon with mild thickening of the sigmoid colon suggesting early diverticulitis. No abscess. Hepatic cirrhosis with mild ascites and edema throughout the subcutaneous soft tissues. Cardiac enlargement.   Electronically Signed   By: Burman Nieves M.D.   On: 09/14/2013 23:51   Dg Chest 2 View  09/15/2013   CLINICAL DATA:  Chest pain.  EXAM: CHEST  2 VIEW  COMPARISON:  06/28/2013  FINDINGS: Cardiac pacemaker. Cardiac enlargement without vascular congestion. Mild perihilar infiltration suggesting edema. Appearance is improved since previous study. No  blunting of costophrenic angles. No pneumothorax.  IMPRESSION: Cardiac enlargement with mild perihilar edema.   Electronically Signed   By: Burman Nieves M.D.   On: 09/15/2013 00:08   Dg Chest Port 1 View  09/15/2013   CLINICAL DATA:  Status post central line placement  EXAM: PORTABLE CHEST - 1 VIEW  COMPARISON:  09/14/2013  FINDINGS: A defibrillator is noted on the left. A right jugular central line is noted in the mid to distal superior vena cava. No pneumothorax is seen. Mild interstitial changes are again identified. No focal infiltrate is seen.  IMPRESSION: No pneumothorax following central line placement.  Electronically Signed   By: Alcide Clever M.D.   On: 09/15/2013 12:38    Admission HPI: Mr. Stickels is a 48 year old male with poorly controlled DM2 (A1c 13.5), CKD Stage 3, chronic systolic CHF (EF 16%, 07/18/12), cocaine abuse who is admitted from clinic for abdominal pain, nausea, vomiting, and diarrhea. His nausea/vomiting first began 4 weeks ago and occurs multiple times per day. He reports that the vomitus is thick with food. He has taken Zofran for the last 3 days, but it has not relieved his symptoms. Five days ago, he first noted diarrhea with alternating consistency: soft, non-formed stools and clear, watery stools. He also vomited at that time and described the vomitus as pink in color with clots of blood. This episode was accompanied by one dark black stool. His abdominal pain is LLL sharp and tender to palpation. Sitting up makes the better; lying down makes the pain worse. Pain does not increase or decrease with meals, coughing, or bowel movements.   On 8/25, he presented to the ED with the symptoms and was advised to drink more fluids. He reports 10lb weight gain since then. Other symptoms include fever, chills, dyspnea, fatigue, bilateral leg edema but denies night sweats or diaphoresis. He was seen by Dr. Delane Ginger today and was admitted for further work-up.    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   1. Cardiorenal syndrome: Patient with history of chronic systolic NICM with AICD (EF 10%), CKD Stage III and history of cocaine abuse. Initially patient found to have dark urine and cold extremities with elevated Cr 3 (baseline 2.5). Patient hypervolemic on exam with peripheral edema and pulmonary edema on CXR. Heart failure team was consulted. Patient had central line placed to assess CVP and CO-Hb. Milrinone drip and Lasix drip started. Weight up to 241 lb (baseline ~205 lbs). Patient diuresed well at first, but then was non-compliant with diet by eating fast food and high-salt foods. There was difficulty in weaning the Milrinone drip. His Hydralazine was increased to 50 mg TID and Metolazone 5 mg daily, Milrinone gtt, Lasix gtt, and Imdur 30 mg daily were continued. Milrinone was discontinued on 9/14 and patient remained off drip for remainder of hospitalization. Patient then became compliant with diet and was able to diurese much more weight. He diuresed a total of 53.2 L and his weight at discharge was 208 lbs. His Cr remained stable around 2. He was discharged on Torsemide 60 mg BID, Metolazone 2.5 mg every Wed, K-Dur 60 mEq BID and extra 20 mEq on Wed, Hydralazine 50 mg TID, and Imdur 30 mg BID.  Patient has follow up appointments with both Dr. Gala Romney and the IM clinic.   2. Acute gout flare: Patient had acute gout flare of his left elbow on 8/30. He  was continued on his home dose of Allopurinol 100 mg daily. Patient received a 3 day course of Prednisone 40 mg daily. His elbow pain improved. Patient then had an acute gout flare of his feet starting on 9/6. Patient was prescribed another 3 day course of Prednisone but he refused this medication at first due to the steroids. Uric acid level elevated at 11.7. Patient then agreed to complete course on 9/14 and his pain subsided. Pt then had another acute gout attack predominantly in his right foot. His Allopurinol was increased to 200 mg daily and he completed another 3 day course of Prednisone on 9/20.   3. Possible Early Diverticulitis? Resolved. Patient presented with LLQ abdominal pain, fever, nausea, vomiting, and diarrhea. CT Abd/Pelvis was done that showed diverticulitis of the colon with mild thickening of the sigmoid colon suggestive of early diverticulitis. C diff was negative. GI panel negative. Patient continued to have on and off diarrhea for the next few days. His diarrhea improved after receiving Imodium. He did not have any more episodes of diarrhea after 9/2.    4. Type 2 DM: Last HbA1c 13.5 on 07/03/13. Patient noncompliant with diet for most of hospitalization, eating fast food and high sodium/high sugar foods. He improved his diet during the last week of his hospitalization. Pt's blood sugars ranged from 200s-400s, but now 100s-200s. Likely improved since patient making an effort to be more compliant with diet.  and mealtime Novolog increased to 7 units TID. Will need to monitor sugars now that on Prednisone again.  - Continue Lantus 20 units QHS  - Continue Novolog 7 units TID with meals  - Continue insulin sliding scale   5. HTN: BP remained stable in 90s-110s/60-80s. His Hydralazine was increased to 50 mg TID and he was placed  on a Lasix drip which was increased to 20 mg. He was also on Metolazone 5 mg daily. He was discharged on Torsemide 60 mg BID, Metolazone 2.5 mg every Wed, and Hydralazine 50 mg TID.  6. Hyponatremia- Stable: Pt hyponatremic at 127 on admission. Patient asymptomatic. Patient's sodium was 125 on 9/1 and he received 1 dose of Tolvaptan 30 mg. Na improved to 133, has been stable in 128-130 range. Hyponatremia likely 2/2 hypervolemia. Diuresis was continued throughout his hospitalization, which stabilized his hyponatremia.   7. Hypokalemia: Patient with hyperkalemia on admission (see below). K then trended down to 3.3 on 9/3. Patient was restarted on K-Dur 40 mEq BID. Potassium continued to fluctuate between 3.2-4.0 and was repleted as necessary.   8. Hyperkalemia: On admission, patient had K of 6.5 likely 2/2 excessive home supplementation. EKG showed peaked T waves in leads V2-V4. He was given 50% dextrose, 10 units Novolog, and Kayexalate 30g. Repeat bmet the following morning showed K 5.1. No further changes were seen on telemetry. Patient's potassium then trended down (see above), but remained stable in 3.5-4.0 range for most of his hospitalization.    9. CAD: Patient was continued on his home Atorvastatin 80 mg daily and ASA 81 mg daily.   10.GERD: Patient takes Ranitidine 150 mg daily at home. He was placed on Famotidine 20 mg daily and this was continued throughout his hospitalization. Patient was discharged on home Ranitidine.   11. Asthma: Patient takes Albuterol 2.5mg  Q2H PRN for wheezing at home. This medication was continued.   Discharge Vitals:   BP 97/76  Pulse 88  Temp(Src) 98.1 F (36.7 C) (Oral)  Resp 20  Ht 6' (1.829 m)  Wt 208  lb 1.6 oz (94.394 kg)  BMI 28.22 kg/m2  SpO2 99% Physical Exam  General: sitting up in bed eating breakfast, NAD  HEENT: [place]/AT, EOMI, mucus membranes moist  Neck: JVD to jawline  CV: mildly tachycardic, 2/6 systolic murmur  Pulm: CTA bilaterally,  breaths non-labored  Ext: mild swelling and tenderness of right foot, 1+ pitting edema in lower extremities bilaterally  Neuro: alert and oriented x 3, CNs II-XII intact  Discharge Labs:  Results for orders placed during the hospital encounter of 09/14/13 (from the past 24 hour(s))  GLUCOSE, CAPILLARY     Status: Abnormal   Collection Time    10/07/13  4:37 PM      Result Value Ref Range   Glucose-Capillary 398 (*) 70 - 99 mg/dL  GLUCOSE, CAPILLARY     Status: Abnormal   Collection Time    10/07/13  9:27 PM      Result Value Ref Range   Glucose-Capillary 138 (*) 70 - 99 mg/dL   Comment 1 Capillary Sample    BASIC METABOLIC PANEL     Status: Abnormal   Collection Time    10/08/13  4:02 AM      Result Value Ref Range   Sodium 132 (*) 137 - 147 mEq/L   Potassium 4.0  3.7 - 5.3 mEq/L   Chloride 83 (*) 96 - 112 mEq/L   CO2 34 (*) 19 - 32 mEq/L   Glucose, Bld 233 (*) 70 - 99 mg/dL   BUN 132 (*) 6 - 23 mg/dL   Creatinine, Ser 4.40 (*) 0.50 - 1.35 mg/dL   Calcium 9.9  8.4 - 10.2 mg/dL   GFR calc non Af Amer 33 (*) >90 mL/min   GFR calc Af Amer 38 (*) >90 mL/min   Anion gap 15  5 - 15  CARBOXYHEMOGLOBIN     Status: None   Collection Time    10/08/13  4:04 AM      Result Value Ref Range   Total hemoglobin 15.1  13.5 - 18.0 g/dL   O2 Saturation 72.5     Carboxyhemoglobin 1.1  0.5 - 1.5 %   Methemoglobin 0.7  0.0 - 1.5 %   Total oxygen content    15.0 - 23.0 mL/dL   Value:  VALUES BELOW REPORTABLE RANGE CALLED TO FRANCIA ABERION,RN AT 0414 ON 10/08/2013 BY FRANK MIKE JR RRT,RCP  GLUCOSE, CAPILLARY     Status: Abnormal   Collection Time    10/08/13  8:20 AM      Result Value Ref Range   Glucose-Capillary 177 (*) 70 - 99 mg/dL  GLUCOSE, CAPILLARY     Status: Abnormal   Collection Time    10/08/13 12:10 PM      Result Value Ref Range   Glucose-Capillary 151 (*) 70 - 99 mg/dL    Signed: Rich Number, MD 10/08/2013, 1:56 PM    Services Ordered on Discharge: None Equipment  Ordered on Discharge: None

## 2013-10-06 NOTE — Progress Notes (Signed)
Subjective: Pt in much better spirits this morning and states that he is feeling pretty good today. His right foot pain is improved after starting the Prednisone yesterday, and he has been able to ambulate. Weight stable, down 1lb to 212lbs.    Objective: Vital signs in last 24 hours: Filed Vitals:   10/05/13 2331 10/06/13 0311 10/06/13 0824 10/06/13 1147  BP: 110/67 108/87 126/73 113/77  Pulse: 90 90  96  Temp: 97.4 F (36.3 C) 97.8 F (36.6 C) 97.6 F (36.4 C) 97.3 F (36.3 C)  TempSrc: Oral Oral Oral Oral  Resp: Height:      Weight:  212 lb 15.4 oz (96.6 kg)    SpO2: 96% 94% 96% 96%   Weight change: -2.2 oz (-0.062 kg)  Intake/Output Summary (Last 24 hours) at 10/06/13 1204 Last data filed at 10/06/13 1134  Gross per 24 hour  Intake   1856 ml  Output   4075 ml  Net  -2219 ml   Physical Exam General: Sitting up in bed, appears comfortable, and is being very pleasant  HEENT: [place]/AT, EOMI Neck: JVD to jawline CV: tachycardic, 2/6 systolic murmur Pulm: CTAB Abd: BS+, soft, non-tender Ext: Right foot is still swollen and erythematous present along medial arch and dorsum of the foot, still with significant tenderness to palpation, no increased warmth.  Neuro: Alert and oriented x 3,   Lab Results: Basic Metabolic Panel:  Recent Labs Lab 10/05/13 0344 10/06/13 0608  NA 131* 129*  K 4.2 3.9  CL 81* 79*  CO2 33* 34*  GLUCOSE 182* 241*  BUN 103* 109*  CREATININE 2.33* 2.34*  CALCIUM 9.6 9.7   CBG:  Recent Labs Lab 10/05/13 0811 10/05/13 1224 10/05/13 1627 10/05/13 2126 10/06/13 0826 10/06/13 1151  GLUCAP 126* 89 213* 345* 291* 251*   Urine Drug Screen: Drugs of Abuse     Component Value Date/Time   LABOPIA NONE DETECTED 09/14/2013 2359   LABOPIA NEG 02/01/2012 1356   COCAINSCRNUR NONE DETECTED 09/14/2013 2359   COCAINSCRNUR PPS 02/01/2012 1356   LABBENZ NONE DETECTED 09/14/2013 2359   LABBENZ PPS 02/01/2012 1356   LABBENZ NEG 07/09/2010  1522   AMPHETMU NONE DETECTED 09/14/2013 2359   AMPHETMU NEG 07/09/2010 1522   THCU NONE DETECTED 09/14/2013 2359   LABBARB NONE DETECTED 09/14/2013 2359   LABBARB NEG 02/01/2012 1356      Micro Results: No results found for this or any previous visit (from the past 240 hour(s)). Studies/Results: No results found.  Medications: I have reviewed the patient's current medications. Scheduled Meds: . allopurinol  200 mg Oral Daily  . aspirin EC  81 mg Oral Daily  . atorvastatin  80 mg Oral q1800  . bacitracin-neomycin-polymyxin-hydrocortisone   Topical BID  . famotidine  20 mg Oral Daily  . gabapentin  200 mg Oral TID  . heparin  5,000 Units Subcutaneous 3 times per day  . hydrALAZINE  50 mg Oral TID  . insulin aspart  0-20 Units Subcutaneous TID WC  . insulin aspart  0-5 Units Subcutaneous QHS  . insulin aspart  8 Units Subcutaneous TID WC  . insulin glargine  20 Units Subcutaneous QHS  . isosorbide mononitrate  30 mg Oral BID  . metolazone  5 mg Oral BID  . potassium chloride  40 mEq Oral TID  . predniSONE  40 mg Oral Q breakfast  . sodium chloride  3 mL Intravenous Q12H   Continuous Infusions: . furosemide (  LASIX) infusion 20 mg/hr (10/06/13 1129)   PRN Meds:.sodium chloride, albuterol, loperamide, LORazepam, morphine injection, ondansetron (ZOFRAN) IV, oxyCODONE, senna, sodium chloride Assessment/Plan: Mr. 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   Cardiorenal syndrome: CHF complicated by CKD. Weight down 1lb to 212 lbs. Baseline weight appears to be ~205 lbs. Patient previously not compliant with his diet and not watching fluid intake, but has been making increased effort over the past week. Lasix drip was restarted on 9/11, at 20 mg. He has diuresed  3.5 L in the past day, -47.7 L since admission. Cr stable at 2.34. CVP 21 today. Milrinone stopped 9/14. Continue to stress importance of restricting fluid intake and avoiding salt. Patient previously on Hospice care, would be best option for him at this point, as he is not a candidate for home Milrinone given drug history. - Heart Failure team following, appreciate recs  - Continue Hydralazine 50 mg TID  - Continue Metolazone 5 mg BID  - Lasix drip per HF - Continue Imdur 30 mg daily  - KCl po BID  - Keep central line to access CVP and CO-Hb  - Avoid beta blocker due to low output  - Avoid ACE- inhibitor due to acute renal failure  - Strict I&Os  - Daily weights   Acute gout flare: Initially in his elbow, which resolved with prednisone x3 days on 9/1. Since, patient w/ pain in his feet, especially the 1st MTPs, which improved with completion of a second 3-day course of Prednisone 40 mg on 9/14. 9/18 pt complaining of severe pain in his right foot. Foot is swollen and erythematous along medial arch and dorsum or the foot. Not giving Colchicine 2/2 renal function. Uric acid level elevated at 11.7. Increased Allopurinol to 200mg  daily and initiated new Prednisone course.  - Allopurinol to 200 mg daily, max for renal dosing - Prednisone 40 mg daily x3 days (day 2/3) - Continue Gabapentin 200 mg TID   Type 2 DM: Pt's blood sugars are in the 200s after starting the prednisone. Novolog increased to 8 units TID but may need to increase more.  - Continue Lantus 20 units QHS  - Novolog 8 units TID with meals  - Continue insulin sliding scale   HTN: BP stable.  - Continue Hydralazine 50 mg TID  - Continue Lasix drip as above  - Continue Metolazone 5 mg daily   Hyponatremia: Patient asymptomatic. Patient's sodium was 125 on 9/1 and he received 1 dose of Tolvaptan 30 mg. Na improved to 133, has been stable in 128-130 range. Hyponatremia likely 2/2 hypervolemia. Continue to diurese.  - AM BMP     CAD: Continue home atorvastatin 80 mg daily and ASA 81 mg daily.   GERD:  - Continue famotidine 20mg    Asthma:  - Continue albuterol 2.5mg  q2h prn for wheezing.   Diet: Heart Healthy/Carb modified   DVT/PE PPx: Heparin SQ   Dispo: Likely discharge Monday with home hospice.  .   The patient does have a current PCP Baltazar Apo, MD) and does need an [facility] hospital follow-up appointment after discharge.  The patient does not have transportation limitations that hinder transportation to clinic appointments.  .Services Needed at time of discharge: Y = Yes, Blank = No PT:   OT:  RN:   Equipment:   Other:     LOS: 22 days   Genelle Gather, MD 10/06/2013, 12:04 PM

## 2013-10-06 NOTE — Progress Notes (Signed)
CARDIAC REHAB PHASE I   PRE:  Rate/Rhythm: 92 SR  BP:  Supine:   Sitting: 109/62  Standing:    SaO2: 97% RA  MODE:  Ambulation: 350 ft   POST:  Rate/Rhythm: 109 ST  BP:  Supine:   Sitting: 113/77  Standing:    SaO2: 96% RA  3810-1751 Patient tolerated ambulation well, independently, gait slow, steady due to gout. Pt c/o gout pain, right leg, which he rates a 6/10 on the pain scale. To bedside after walk, VSS.  Artist Pais, MS, ACSM CCEP

## 2013-10-06 NOTE — Progress Notes (Signed)
Related admission to [facility] diagnosis of hypertrophic cardiomyopathy.  Patient is a full code.  Found patient receiving central line dressing change from staff.  Reviewed chart and patient continues on Lasix gtt for diuresis and prednisone for gout flare.  Continue current plan of care and anticipate discharge with continued Hospice services.  Encourage a call to Hospice at [phone] with any needs.  April Costella Hatcher, RN, BSN Hospice

## 2013-10-07 LAB — GLUCOSE, CAPILLARY
GLUCOSE-CAPILLARY: 352 mg/dL — AB (ref 70–99)
GLUCOSE-CAPILLARY: 398 mg/dL — AB (ref 70–99)
Glucose-Capillary: 106 mg/dL — ABNORMAL HIGH (ref 70–99)
Glucose-Capillary: 222 mg/dL — ABNORMAL HIGH (ref 70–99)

## 2013-10-07 LAB — CARBOXYHEMOGLOBIN
Carboxyhemoglobin: 1.3 % (ref 0.5–1.5)
Methemoglobin: 0.6 % (ref 0.0–1.5)
O2 SAT: 65.6 %
TOTAL HEMOGLOBIN: 14.4 g/dL (ref 13.5–18.0)

## 2013-10-07 LAB — BASIC METABOLIC PANEL
ANION GAP: 15 (ref 5–15)
BUN: 108 mg/dL — AB (ref 6–23)
CHLORIDE: 80 meq/L — AB (ref 96–112)
CO2: 35 mEq/L — ABNORMAL HIGH (ref 19–32)
Calcium: 10.1 mg/dL (ref 8.4–10.5)
Creatinine, Ser: 2.12 mg/dL — ABNORMAL HIGH (ref 0.50–1.35)
GFR calc Af Amer: 41 mL/min — ABNORMAL LOW (ref >90)
GFR, EST NON AFRICAN AMERICAN: 36 mL/min — AB (ref >90)
Glucose, Bld: 206 mg/dL — ABNORMAL HIGH (ref 70–99)
POTASSIUM: 3.8 meq/L (ref 3.7–5.3)
SODIUM: 130 meq/L — AB (ref 137–147)

## 2013-10-07 MED ORDER — INSULIN ASPART 100 UNIT/ML ~~LOC~~ SOLN
9.0000 [IU] | Freq: Three times a day (TID) | SUBCUTANEOUS | Status: DC
  Administered 2013-10-07 – 2013-10-08 (×3): 9 [IU] via SUBCUTANEOUS

## 2013-10-07 NOTE — Progress Notes (Signed)
On call MD for Internal Medicine Teaching Service made aware of cbg 511. Order obtained to give 20 units of Novolog and recheck an hour later.   2340 MD made aware of cbg 352. No new orders obtained. Will continue to monitor.

## 2013-10-07 NOTE — Progress Notes (Signed)
Subjective: Pt in excellent spirits this morning and states that he is feeling pretty good. His right foot pain continues to improve after starting the Prednisone 9/18, and he has been able to ambulate. Weight down 5lb to 207lbs.    Objective: Vital signs in last 24 hours: Filed Vitals:   10/07/13 0545 10/07/13 0811 10/07/13 1056 10/07/13 1100  BP: 99/76 107/71 104/80 113/76  Pulse:      Temp:  97.6 F (36.4 C)  97.3 F (36.3 C)  TempSrc:  Oral  Oral  Resp:      Height:      Weight:      SpO2:  99%  99%   Weight change: -5 lb 1.1 oz (-2.3 kg)  Intake/Output Summary (Last 24 hours) at 10/07/13 1210 Last data filed at 10/07/13 0800  Gross per 24 hour  Intake    990 ml  Output   1600 ml  Net   -610 ml   Physical Exam General: Sitting on the side of the bed, appears comfortable, and is very pleasant  HEENT: [place]/AT, EOMI CV: Tachycardic, 2/6 systolic murmur Pulm: CTAB Abd: BS+, soft, non-tender Ext: Right foot is still swollen and erythematous present along medial arch and dorsum of the foot, still with tenderness to palpation, no increased warmth. Improved from yesterday. Neuro: Alert and oriented x 3,   Lab Results: Basic Metabolic Panel:  Recent Labs Lab 10/05/13 0344 10/06/13 0608  NA 131* 129*  K 4.2 3.9  CL 81* 79*  CO2 33* 34*  GLUCOSE 182* 241*  BUN 103* 109*  CREATININE 2.33* 2.34*  CALCIUM 9.6 9.7   CBG:  Recent Labs Lab 10/05/13 2126 10/06/13 0826 10/06/13 1151 10/06/13 2133 10/06/13 2333 10/07/13 0816  GLUCAP 345* 291* 251* 511* 352* 106*   Urine Drug Screen: Drugs of Abuse     Component Value Date/Time   LABOPIA NONE DETECTED 09/14/2013 2359   LABOPIA NEG 02/01/2012 1356   COCAINSCRNUR NONE DETECTED 09/14/2013 2359   COCAINSCRNUR PPS 02/01/2012 1356   LABBENZ NONE DETECTED 09/14/2013 2359   LABBENZ PPS 02/01/2012 1356   LABBENZ NEG 07/09/2010 1522   AMPHETMU NONE DETECTED 09/14/2013 2359   AMPHETMU NEG 07/09/2010 1522   THCU NONE  DETECTED 09/14/2013 2359   LABBARB NONE DETECTED 09/14/2013 2359   LABBARB NEG 02/01/2012 1356      Micro Results: No results found for this or any previous visit (from the past 240 hour(s)). Studies/Results: No results found.  Medications: I have reviewed the patient's current medications. Scheduled Meds: . allopurinol  200 mg Oral Daily  . alteplase  2 mg Intracatheter Once  . aspirin EC  81 mg Oral Daily  . atorvastatin  80 mg Oral q1800  . bacitracin-neomycin-polymyxin-hydrocortisone   Topical BID  . famotidine  20 mg Oral Daily  . gabapentin  200 mg Oral TID  . heparin  5,000 Units Subcutaneous 3 times per day  . hydrALAZINE  50 mg Oral TID  . insulin aspart  0-20 Units Subcutaneous TID WC  . insulin aspart  0-5 Units Subcutaneous QHS  . insulin aspart  8 Units Subcutaneous TID WC  . insulin glargine  20 Units Subcutaneous QHS  . isosorbide mononitrate  30 mg Oral BID  . metolazone  5 mg Oral BID  . potassium chloride  40 mEq Oral TID  . sodium chloride  10-40 mL Intracatheter Q12H  . sodium chloride  3 mL Intravenous Q12H   Continuous Infusions: . furosemide (LASIX) infusion  20 mg/hr (10/06/13 1129)   PRN Meds:.sodium chloride, albuterol, loperamide, LORazepam, morphine injection, ondansetron (ZOFRAN) IV, oxyCODONE, senna, sodium chloride, sodium chloride Assessment/Plan: Mr. Sawin is a 48yo man w/ PMHx of poorly controlled Type 2 DM, CKD Stage 3, chronic systolic non-ischemic cardiomyopathy with AICD (EF 10%) 2/2 polysubstance abuse (alcohol, cocaine) who was admitted for abdominal pain, nausea, vomiting, and diarrhea and cardiorenal syndrome.   Cardiorenal syndrome: CHF complicated by CKD. Weight down 5 more lbs to 207 lbs. Baseline weight appears to be ~205 lbs. Patient previously not compliant with his diet and not watching fluid intake, but has been making increased effort over the past week. Lasix drip was restarted on 9/11, at 20 mg. He is -49.5 L since admission. Cr  stable at 2.34 on 9/19, results still pending for today. CVP increasing, 21 today. Milrinone stopped 9/14. Continue to stress importance of restricting fluid intake and avoiding salt. Patient previously on Hospice care, would be best option for him at this point, as he is not a candidate for home Milrinone given drug history. - Heart Failure team following, appreciate recs  - Continue Hydralazine 50 mg TID  - Continue Metolazone 5 mg BID  - Lasix drip per HF - Continue Imdur 30 mg daily  - KCl po BID  - Keep central line to access CVP and CO-Hb  - Avoid beta blocker due to low output  - Avoid ACE- inhibitor due to acute renal failure  - Strict I&Os  - Daily weights   Acute gout flare: Initially in his elbow, which resolved with prednisone x3 days on 9/1. Since, patient w/ pain in his feet, especially the 1st MTPs, which improved with completion of a second 3-day course of Prednisone 40 mg on 9/14. 9/18 pt complaining of severe pain in his right foot. Foot is swollen and erythematous along medial arch and dorsum or the foot. Not giving Colchicine 2/2 renal function. Uric acid level elevated at 11.7. Increased Allopurinol to  daily and initiated new Prednisone course.  - Allopurinol to 200 mg daily, max for renal dosing - Prednisone 40 mg daily x3 days (day 3/3), may extend - Continue Gabapentin 200 mg TID   Type 2 DM: Pt's blood sugars are labile after starting the prednisone. Novolog increased to 9 units TID. Suspect CBGs will improve after finishing the Prednisone.  - Continue Lantus 20 units QHS  - Novolog 8 units TID with meals  - Continue insulin sliding scale   HTN: BP stable.  - Continue Hydralazine 50 mg TID  - Continue Lasix drip as above  - Continue Metolazone 5 mg daily   Hyponatremia: Patient asymptomatic. Patient's sodium was 125 on 9/1 and he received 1 dose of Tolvaptan 30 mg. Na improved to 133, has been stable in 128-130 range. Hyponatremia likely 2/2  hypervolemia. Continue to diurese.  - AM BMP   CAD: Continue home atorvastatin 80 mg daily and ASA 81 mg daily.   GERD:  - Continue famotidine    Asthma:  - Continue albuterol 2.5mg  q2h prn for wheezing.   Diet: Heart Healthy/Carb modified   DVT/PE PPx: Heparin SQ   Dispo: Likely discharge Monday with home hospice.  .   The patient does have a current PCP Baltazar Apo, MD) and does need an [facility] hospital follow-up appointment after discharge.  The patient does not have transportation limitations that hinder transportation to clinic appointments.  .Services Needed at time of discharge: Y = Yes, Blank = No PT:  OT:   RN:   Equipment:   Other:     LOS: 23 days   Genelle Gather, MD 10/07/2013, 12:10 PM

## 2013-10-07 NOTE — Progress Notes (Signed)
Related admission to [facility] diagnosis of hypertrophic cardiomyopathy.  Patient is a full code.  Found patient awake and talking on the phone in bed.  No family present.  Reviewed chart and patient has improved gout pain with the use of Prednisone.  Patient was able to ambulate independently yesterday and remained hemodynamically stable.  Patient continues to verbalize desire to return home independently, discharge planning underway.  Continue current plan of care, encourage a call to Hospice at [phone] with any needs.  April Costella Hatcher, RN, BSN Hospice

## 2013-10-08 LAB — GLUCOSE, CAPILLARY
GLUCOSE-CAPILLARY: 138 mg/dL — AB (ref 70–99)
GLUCOSE-CAPILLARY: 177 mg/dL — AB (ref 70–99)
GLUCOSE-CAPILLARY: 151 mg/dL — AB (ref 70–99)

## 2013-10-08 LAB — BASIC METABOLIC PANEL
Anion gap: 15 (ref 5–15)
BUN: 114 mg/dL — AB (ref 6–23)
CHLORIDE: 83 meq/L — AB (ref 96–112)
CO2: 34 meq/L — AB (ref 19–32)
Calcium: 9.9 mg/dL (ref 8.4–10.5)
Creatinine, Ser: 2.26 mg/dL — ABNORMAL HIGH (ref 0.50–1.35)
GFR calc Af Amer: 38 mL/min — ABNORMAL LOW (ref >90)
GFR, EST NON AFRICAN AMERICAN: 33 mL/min — AB (ref >90)
Glucose, Bld: 233 mg/dL — ABNORMAL HIGH (ref 70–99)
POTASSIUM: 4 meq/L (ref 3.7–5.3)
Sodium: 132 mEq/L — ABNORMAL LOW (ref 137–147)

## 2013-10-08 LAB — CARBOXYHEMOGLOBIN
Carboxyhemoglobin: 1.1 % (ref 0.5–1.5)
Methemoglobin: 0.7 % (ref 0.0–1.5)
O2 Saturation: 58.3 %
Total hemoglobin: 15.1 g/dL (ref 13.5–18.0)

## 2013-10-08 MED ORDER — ISOSORBIDE MONONITRATE ER 30 MG PO TB24: 30.0000 mg | ORAL_TABLET | Freq: Two times a day (BID) | ORAL | Status: DC

## 2013-10-08 MED ORDER — GABAPENTIN 100 MG PO CAPS: 200.0000 mg | ORAL_CAPSULE | Freq: Three times a day (TID) | ORAL | Status: AC

## 2013-10-08 MED ORDER — INSULIN ASPART 100 UNIT/ML ~~LOC~~ SOLN: 9.0000 [IU] | Freq: Three times a day (TID) | SUBCUTANEOUS | Status: AC

## 2013-10-08 MED ORDER — ALLOPURINOL 100 MG PO TABS: 200.0000 mg | ORAL_TABLET | Freq: Every day | ORAL | Status: DC

## 2013-10-08 MED ORDER — HYDRALAZINE HCL 50 MG PO TABS: 50.0000 mg | ORAL_TABLET | Freq: Three times a day (TID) | ORAL | Status: DC

## 2013-10-08 NOTE — Progress Notes (Signed)
Subjective: Patient states he is still having foot pain, but that the swelling and pain has improved overall. Weight is 208 lbs. Patient states he would like to go home today. He has follow-up with both Dr. Gala Romney and the Internal Medicine clinic next week.   Objective: Vital signs in last 24 hours: Filed Vitals:   10/08/13 0011 10/08/13 0400 10/08/13 0637 10/08/13 0740  BP: 110/78 94/69  97/76  Pulse:  95  88  Temp:  98 F (36.7 C)  98.1 F (36.7 C)  TempSrc:  Oral  Oral  Resp:    20  Height:      Weight:   208 lb 1.6 oz (94.394 kg)   SpO2:  98%  99%   Weight change: 3.3 oz (0.094 kg)  Intake/Output Summary (Last 24 hours) at 10/08/13 1122 Last data filed at 10/08/13 0700  Gross per 24 hour  Intake   1180 ml  Output   3550 ml  Net  -2370 ml   Physical Exam General: sitting up in bed eating breakfast, NAD HEENT: [place]/AT, EOMI, mucus membranes moist Neck: JVD to jawline CV: mildly tachycardic, 2/6 systolic murmur Pulm: CTA bilaterally, breaths non-labored Ext: mild swelling and tenderness of right foot, 1+ pitting edema in lower extremities bilaterally Neuro: alert and oriented x 3, CNs II-XII intact  Lab Results: Basic Metabolic Panel:  Recent Labs Lab 10/07/13 1214 10/08/13 0402  NA 130* 132*  K 3.8 4.0  CL 80* 83*  CO2 35* 34*  GLUCOSE 206* 233*  BUN 108* 114*  CREATININE 2.12* 2.26*  CALCIUM 10.1 9.9   Liver Function Tests: No results found for this basename: AST, ALT, ALKPHOS, BILITOT, PROT, ALBUMIN,  in the last 168 hours No results found for this basename: LIPASE, AMYLASE,  in the last 168 hours No results found for this basename: AMMONIA,  in the last 168 hours CBC: No results found for this basename: WBC, NEUTROABS, HGB, HCT, MCV, PLT,  in the last 168 hours Cardiac Enzymes: No results found for this basename: CKTOTAL, CKMB, CKMBINDEX, TROPONINI,  in the last 168 hours BNP: No results found for this basename: PROBNP,  in the last 168  hours D-Dimer: No results found for this basename: DDIMER,  in the last 168 hours CBG:  Recent Labs Lab 10/06/13 2333 10/07/13 0816 10/07/13 1204 10/07/13 1637 10/07/13 2127 10/08/13 0820  GLUCAP 352* 106* 222* 398* 138* 177*   Hemoglobin A1C: No results found for this basename: HGBA1C,  in the last 168 hours Fasting Lipid Panel: No results found for this basename: CHOL, HDL, LDLCALC, TRIG, CHOLHDL, LDLDIRECT,  in the last 168 hours Thyroid Function Tests: No results found for this basename: TSH, T4TOTAL, FREET4, T3FREE, THYROIDAB,  in the last 168 hours Coagulation: No results found for this basename: LABPROT, INR,  in the last 168 hours Anemia Panel: No results found for this basename: VITAMINB12, FOLATE, FERRITIN, TIBC, IRON, RETICCTPCT,  in the last 168 hours Urine Drug Screen: Drugs of Abuse     Component Value Date/Time   LABOPIA NONE DETECTED 09/14/2013 2359   LABOPIA NEG 02/01/2012 1356   COCAINSCRNUR NONE DETECTED 09/14/2013 2359   COCAINSCRNUR PPS 02/01/2012 1356   LABBENZ NONE DETECTED 09/14/2013 2359   LABBENZ PPS 02/01/2012 1356   LABBENZ NEG 07/09/2010 1522   AMPHETMU NONE DETECTED 09/14/2013 2359   AMPHETMU NEG 07/09/2010 1522   THCU NONE DETECTED 09/14/2013 2359   LABBARB NONE DETECTED 09/14/2013 2359   LABBARB NEG 02/01/2012 1356  Alcohol Level: No results found for this basename: ETH,  in the last 168 hours Urinalysis: No results found for this basename: COLORURINE, APPERANCEUR, LABSPEC, PHURINE, GLUCOSEU, HGBUR, BILIRUBINUR, KETONESUR, PROTEINUR, UROBILINOGEN, NITRITE, LEUKOCYTESUR,  in the last 168 hours   Micro Results: No results found for this or any previous visit (from the past 240 hour(s)). Studies/Results: No results found. Medications: I have reviewed the patient's current medications. Scheduled Meds: . allopurinol  200 mg Oral Daily  . alteplase  2 mg Intracatheter Once  . aspirin EC  81 mg Oral Daily  . atorvastatin  80 mg Oral q1800  .  bacitracin-neomycin-polymyxin-hydrocortisone   Topical BID  . famotidine  20 mg Oral Daily  . gabapentin  200 mg Oral TID  . heparin  5,000 Units Subcutaneous 3 times per day  . hydrALAZINE  50 mg Oral TID  . insulin aspart  0-20 Units Subcutaneous TID WC  . insulin aspart  0-5 Units Subcutaneous QHS  . insulin aspart  9 Units Subcutaneous TID WC  . insulin glargine  20 Units Subcutaneous QHS  . isosorbide mononitrate  30 mg Oral BID  . metolazone  5 mg Oral BID  . potassium chloride  40 mEq Oral TID  . sodium chloride  10-40 mL Intracatheter Q12H  . sodium chloride  3 mL Intravenous Q12H   Continuous Infusions: . furosemide (LASIX) infusion 20 mg/hr (10/08/13 0330)   PRN Meds:.sodium chloride, albuterol, loperamide, LORazepam, morphine injection, ondansetron (ZOFRAN) IV, oxyCODONE, senna, sodium chloride, sodium chloride Assessment/Plan: Mr. Pistilli is a 48yo man w/ PMHx of poorly controlled Type 2 DM, CKD Stage 3, chronic systolic non-ischemic cardiomyopathy with AICD (EF 10%) 2/2 polysubstance abuse (alcohol, cocaine) who was admitted for abdominal pain, nausea, vomiting, and diarrhea and cardiorenal syndrome.   1. Cardiorenal syndrome: CHF complicated by CKD. Weight stable at 208 lbs. Baseline weight appears to be ~205 lbs. Patient previously not compliant with his diet and not watching fluid intake, but has been making increased effort over the past week. Lasix drip was restarted on 9/11, at 20 mg. He is -52.5 L since admission. Cr stable at 2.26. CVP 15 today. Milrinone stopped 9/14. Continue to stress importance of restricting fluid intake and avoiding salt. Patient previously on Hospice care, would be best option for him at this point, as he is not a candidate for home Milrinone given drug history. Patient prefers to go home with home services provided by hospice.  - Heart Failure team following, appreciate recs  - Continue Hydralazine 50 mg TID  - Decrease Metolazone to 2.5 mg every  Wed and as needed for weight gain  - Stopping IV Lasix today. Start Torsemide 60 mg BID  - Increase Imdur to 30 mg BID - Increase K-Dur to po BID with extra dose on Wed  - d/c central line  - Avoid beta blocker due to low output  - Avoid ACE- inhibitor due to acute renal failure   2. Acute gout flare: Initially in his elbow, which resolved with prednisone x3 days on 9/1. Since, patient w/ pain in his feet, especially the 1st MTPs, which improved with completion of a second 3-day course of Prednisone 40 mg on 9/14. 9/18 pt complaining of severe pain in his right foot. Foot is swollen and erythematous along medial arch and dorsum or the foot. Not giving Colchicine 2/2 renal function. Uric acid level elevated at 11.7. Increased Allopurinol to  daily and initiated new Prednisone course.  - Allopurinol to 200  mg daily, max for renal dosing  - 3rd course of Prednisone completed on 9/20 - Continue Gabapentin 200 mg TID   3. Type 2 DM: Pt's blood sugars are labile after starting the prednisone. Novolog increased to 9 units TID. Suspect CBGs will improve after finishing the Prednisone.  - Continue Lantus 20 units QHS  - Novolog 8 units TID with meals  - Continue insulin sliding scale   4. HTN: BP stable.  - Continue Hydralazine 50 mg TID  - Continue Lasix drip as above  - Continue Metolazone 5 mg daily   5. Hyponatremia: Patient asymptomatic. Patient's sodium was 125 on 9/1 and he received 1 dose of Tolvaptan 30 mg. Na improved to 133, has been stable in 128-130 range. Hyponatremia likely 2/2 hypervolemia. Continue to diurese.   6. CAD: Continue home atorvastatin 80 mg daily and ASA 81 mg daily.   7. GERD:  - Continue famotidine    8. Asthma:  - Continue albuterol 2.5mg  q2h prn for wheezing.   Diet: Heart Healthy/Carb modified  DVT/PE PPx: Heparin SQ  Dispo: Likely discharge today to home  The patient does have a current PCP Baltazar Apo, MD) and does need an [facility]  hospital follow-up appointment after discharge.  The patient does not have transportation limitations that hinder transportation to clinic appointments.  .Services Needed at time of discharge: Y = Yes, Blank = No PT:   OT:   RN:   Equipment:   Other:     LOS: 24 days   Rich Number, MD 10/08/2013, 11:22 AM

## 2013-10-08 NOTE — Progress Notes (Signed)
Patient requesting  IV nurse to d/c central line only, MD made aware and order noted.

## 2013-10-08 NOTE — Progress Notes (Signed)
Patient verbally stated understanding of discharge teaching/instructions .  Nursing tech took patient to the  ED entrance   in wheelchair that was delivered by [facility] and was picked up by friend.

## 2013-10-08 NOTE — Discharge Instructions (Signed)
Myocardial Infarction A myocardial infarction (MI) is also called a heart attack. It causes damage to the heart that cannot be fixed. An MI often happens when a blood clot or other blockage cuts blood flow to the heart. When this happens, certain areas of the heart begin to die. This is an emergency. HOME CARE  Take medicine as told by your doctor.  Change certain behaviors as told by your doctor. This may include:  Quitting smoking.  Being active.  Keeping a healthy weight.  Eating a heart-healthy diet. Ask your doctor for help with this diet.  Keeping your diabetes under control.  Lessening stress.  Limiting how much alcohol you drink. GET HELP RIGHT AWAY IF:  You have crushing or pressure-like chest pain that spreads to the arms, back, neck, or jaw. Call your local emergency services (911 in [place] Do not drive yourself to the hospital.  You have severe chest pain.  You have shortness of breath during rest, sleep, or with activity.  You have sudden sweating or clammy skin.  You feel sick to your stomach (nauseous) and throw up (vomit).  You suddenly get lightheaded or dizzy.  You feel your heart beating fast or skipping beats. MAKE SURE YOU:   Understand these instructions.  Will watch your condition.  Will get help right away if you are not doing well or get worse. Document Released: 07/06/2011 Document Reviewed: 03/09/2013 [facility] Patient Information 2015 [place], [place]. This information is not intended to replace advice given to you by your health care provider. Make sure you discuss any questions you have with your health care provider.   It was a pleasure taking care of you, Mr. Meskill.  Please make sure to follow a low sodium diet and do not overdo the fluids.  You have an appointment with Dr. Garald Braver on Sept 29th at 10:45 AM.  Low-Sodium Eating Plan Sodium raises blood pressure and causes water to be held in the body. Getting less sodium from food  will help lower your blood pressure, reduce any swelling, and protect your heart, liver, and kidneys. We get sodium by adding salt (sodium chloride) to food. Most of our sodium comes from canned, boxed, and frozen foods. Restaurant foods, fast foods, and pizza are also very high in sodium. Even if you take medicine to lower your blood pressure or to reduce fluid in your body, getting less sodium from your food is important. WHAT IS MY PLAN? Most people should limit their sodium intake to 2,300 mg a day. Your health care provider recommends that you limit your sodium intake to __________ a day.  WHAT DO I NEED TO KNOW ABOUT THIS EATING PLAN? For the low-sodium eating plan, you will follow these general guidelines:  Choose foods with a % Daily Value for sodium of less than 5% (as listed on the food label).   Use salt-free seasonings or herbs instead of table salt or sea salt.   Check with your health care provider or pharmacist before using salt substitutes.   Eat fresh foods.  Eat more vegetables and fruits.  Limit canned vegetables. If you do use them, rinse them well to decrease the sodium.   Limit cheese to 1 oz (28 g) per day.   Eat lower-sodium products, often labeled as "lower sodium" or "no salt added."  Avoid foods that contain monosodium glutamate (MSG). MSG is sometimes added to [place] food and some canned foods.  Check food labels (Nutrition Facts labels) on foods to learn how  much sodium is in one serving.  Eat more home-cooked food and less restaurant, buffet, and fast food.  When eating at a restaurant, ask that your food be prepared with less salt or none, if possible.  HOW DO I READ FOOD LABELS FOR SODIUM INFORMATION? The Nutrition Facts label lists the amount of sodium in one serving of the food. If you eat more than one serving, you must multiply the listed amount of sodium by the number of servings. Food labels may also identify foods as:  Sodium  free--Less than 5 mg in a serving.  Very low sodium--35 mg or less in a serving.  Low sodium--140 mg or less in a serving.  Light in sodium--50% less sodium in a serving. For example, if a food that usually has 300 mg of sodium is changed to become light in sodium, it will have 150 mg of sodium.  Reduced sodium--25% less sodium in a serving. For example, if a food that usually has 400 mg of sodium is changed to reduced sodium, it will have 300 mg of sodium. WHAT FOODS CAN I EAT? Grains Low-sodium cereals, including oats, puffed wheat and rice, and shredded wheat cereals. Low-sodium crackers. Unsalted rice and pasta. Lower-sodium bread.  Vegetables Frozen or fresh vegetables. Low-sodium or reduced-sodium canned vegetables. Low-sodium or reduced-sodium tomato sauce and paste. Low-sodium or reduced-sodium tomato and vegetable juices.  Fruits Fresh, frozen, and canned fruit. Fruit juice.  Meat and Other Protein Products Low-sodium canned tuna and salmon. Fresh or frozen meat, poultry, seafood, and fish. Lamb. Unsalted nuts. Dried beans, peas, and lentils without added salt. Unsalted canned beans. Homemade soups without salt. Eggs.  Dairy Milk. Soy milk. Ricotta cheese. Low-sodium or reduced-sodium cheeses. Yogurt.  Condiments Fresh and dried herbs and spices. Salt-free seasonings. Onion and garlic powders. Low-sodium varieties of mustard and ketchup. Lemon juice.  Fats and Oils Reduced-sodium salad dressings. Unsalted butter.  Other Unsalted popcorn and pretzels.  The items listed above may not be a complete list of recommended foods or beverages. Contact your dietitian for more options. WHAT FOODS ARE NOT RECOMMENDED? Grains Instant hot cereals. Bread stuffing, pancake, and biscuit mixes. Croutons. Seasoned rice or pasta mixes. Noodle soup cups. Boxed or frozen macaroni and cheese. Self-rising flour. Regular salted crackers. Vegetables Regular canned vegetables. Regular canned  tomato sauce and paste. Regular tomato and vegetable juices. Frozen vegetables in sauces. Salted french fries. Olives. Rosita Fire. Relishes. Sauerkraut. Salsa. Meat and Other Protein Products Salted, canned, smoked, spiced, or pickled meats, seafood, or fish. Bacon, ham, sausage, hot dogs, corned beef, chipped beef, and packaged luncheon meats. Salt pork. Jerky. Pickled herring. Anchovies, regular canned tuna, and sardines. Salted nuts. Dairy Processed cheese and cheese spreads. Cheese curds. Blue cheese and cottage cheese. Buttermilk.  Condiments Onion and garlic salt, seasoned salt, table salt, and sea salt. Canned and packaged gravies. Worcestershire sauce. Tartar sauce. Barbecue sauce. Teriyaki sauce. Soy sauce, including reduced sodium. Steak sauce. Fish sauce. Oyster sauce. Cocktail sauce. Horseradish. Regular ketchup and mustard. Meat flavorings and tenderizers. Bouillon cubes. Hot sauce. Tabasco sauce. Marinades. Taco seasonings. Relishes. Fats and Oils Regular salad dressings. Salted butter. Margarine. Ghee. Bacon fat.  Other Potato and tortilla chips. Corn chips and puffs. Salted popcorn and pretzels. Canned or dried soups. Pizza. Frozen entrees and pot pies.  The items listed above may not be a complete list of foods and beverages to avoid. Contact your dietitian for more information. Document Released: 06/26/2001 Document Revised: 01/09/2013 Document Reviewed: 11/08/2012 [employer] Patient Information  2015 [employer]. This information is not intended to replace advice given to you by your health care provider. Make sure you discuss any questions you have with your health care provider. ° °

## 2013-10-08 NOTE — Progress Notes (Signed)
  Date: 10/08/2013  Patient name: [name]  Medical record number: [id number]  Date of birth: 12/23/1965   This patient has been seen and the plan of care was discussed with the house staff. Please see their note for complete details. I concur with their findings with the following additions/corrections: Mr Gimbel is very much wishing to go home and Dr B and HF team OK and have left med recs. Will need close F/U.   Burns Spain, MD 10/08/2013, 11:49 AM

## 2013-10-08 NOTE — Progress Notes (Signed)
CARDIAC REHAB PHASE I   PRE:  Rate/Rhythm: 95 SR with PVCs    BP: sitting 96/61    SaO2:   MODE:  Ambulation: 740 ft   POST:  Rate/Rhythm: 107 ST with PVCs    BP: sitting 119/79     SaO2:   Tolerated well with slow pace. No c/o, independent, talked with his son entire walk. Pt very interested in diet education, very receptive. Gave low sodium sheets and discussed the specific amounts of sodium in certain foods.  Voiced understanding. Also discussed daily wts and Zone tool. Pt receptive with son in the room. Highly encouraged pt to educate himself and read labels in the grocery store.  [phone]  Elissa Lovett [place] CES, ACSM 10/08/2013 12:02 PM

## 2013-10-08 NOTE — Progress Notes (Signed)
Advanced Heart Failure Rounding Note   Subjective:    [name] is a 48y/o male with HTN, DM2, polysunbstance abuse with longstanding HF due to NICM (probably ETOH), EF 10%. Current cocaine abuse. Was previously on [facility]osp[facility].   Admitted with ab pain & n/v/diarrhea. Weight up to 231.   Noncontrast CT ab: Diverticulosis of the colon with mild thickening of the sigmoid colon suggesting early diverticulitis. No abscess. Hepatic cirrhosis with mild ascites and edema throughout the subcutaneous soft tissues. Renal function stable.   Over the weekend he remained on Lasix gtt at /hr. He is off milrinone and co-ox 58%. Weight stable up 1 pound but overall down 23 pounds.  24 hr I/O -2.6 liters. Denies SOB.    Wants to go home.    Cr 2.26    Objective:   Weight Range:  Vital Signs:   Temp:  [97.3 F (36.3 C)-98 F (36.7 C)] 98 F (36.7 C) (09/21 0400) Pulse Rate:  [95-97] 95 (09/21 0400) Resp:  [18-20] 20 (09/20 1600) BP: (94-113)/(64-80) 97/76 mmHg (09/21 0740) SpO2:  [98 %-100 %] 98 % (09/21 0400) Weight:  [208 lb 1.6 oz (94.394 kg)] 208 lb 1.6 oz (94.394 kg) (09/21 0637) Last BM Date: 10/05/13  Weight change: Filed Weights   10/06/13 0311 10/07/13 0500 10/08/13 0637  Weight: 212 lb 15.4 oz (96.6 kg) 207 lb 14.3 oz (94.3 kg) 208 lb 1.6 oz (94.394 kg)    Intake/Output:   Intake/Output Summary (Last 24 hours) at 10/08/13 0742 Last data filed at 10/08/13 0700  Gross per 24 hour  Intake   1620 ml  Output   4250 ml  Net  -2630 ml     Physical Exam: CVP 18 General: Chronically ill appearing. No resp difficulty; Standing in the room .  HEENT: normal  Neck: supple. RIJ central line. JVP to jaw . Carotids 2+ bilaterally; no bruits. No lymphadenopathy or thryomegaly appreciated.  Cor: PMI normal. Tachy & regular rhythm. 2/6 systolic murmur LLSB. + S3  Lungs: clear  Abdomen: obese, soft, nontender, non distended. No hepatosplenomegaly. No bruits or masses. Good bowel  sounds.  Extremities: no cyanosis, clubbing, rash. R and LLE 1+ edema. No edema; both feet and 1st MTP sore to touch  Neuro: alert & orientedx3, cranial nerves grossly intact. Moves all 4 extremities w/o difficulty. Affect pleasant.when   Telemetry: ST PVCs 90s  Labs: Basic Metabolic Panel:  Recent Labs Lab 10/04/13 0440 10/05/13 0344 10/06/13 0608 10/07/13 1214 10/08/13 0402  NA 133* 131* 129* 130* 132*  K 3.6* 4.2 3.9 3.8 4.0  CL 83* 81* 79* 80* 83*  CO2 32 33* 34* 35* 34*  GLUCOSE 192* 182* 241* 206* 233*  BUN 101* 103* 109* 108* 114*  CREATININE 2.23* 2.33* 2.34* 2.12* 2.26*  CALCIUM 9.3 9.6 9.7 10.1 9.9    Liver Function Tests: No results found for this basename: AST, ALT, ALKPHOS, BILITOT, PROT, ALBUMIN,  in the last 168 hours No results found for this basename: LIPASE, AMYLASE,  in the last 168 hours No results found for this basename: AMMONIA,  in the last 168 hours  CBC: No results found for this basename: WBC, NEUTROABS, HGB, HCT, MCV, PLT,  in the last 168 hours  Cardiac Enzymes: No results found for this basename: CKTOTAL, CKMB, CKMBINDEX, TROPONINI,  in the last 168 hours  BNP: BNP (last 3 results)  Recent Labs  07/12/13 1344 09/08/13 2224 09/14/13 1203  PROBNP 5773.0* 6632.0* 10023*     Other  results:  EKG:   Imaging: No results found.   Medications:     Scheduled Medications: . allopurinol  200 mg Oral Daily  . alteplase  2 mg Intracatheter Once  . aspirin EC  81 mg Oral Daily  . atorvastatin  80 mg Oral q1800  . bacitracin-neomycin-polymyxin-hydrocortisone   Topical BID  . famotidine  20 mg Oral Daily  . gabapentin  200 mg Oral TID  . heparin  5,000 Units Subcutaneous 3 times per day  . hydrALAZINE  50 mg Oral TID  . insulin aspart  0-20 Units Subcutaneous TID WC  . insulin aspart  0-5 Units Subcutaneous QHS  . insulin aspart  9 Units Subcutaneous TID WC  . insulin glargine  20 Units Subcutaneous QHS  . isosorbide mononitrate   30 mg Oral BID  . metolazone  5 mg Oral BID  . potassium chloride  40 mEq Oral TID  . sodium chloride  10-40 mL Intracatheter Q12H  . sodium chloride  3 mL Intravenous Q12H    Infusions: . furosemide (LASIX) infusion 20 mg/hr (10/08/13 0330)    PRN Medications: sodium chloride, albuterol, loperamide, LORazepam, morphine injection, ondansetron (ZOFRAN) IV, oxyCODONE, senna, sodium chloride, sodium chloride   Assessment:   1. A/c systolic HF with low output physiology  --d/c weight in 6/15 was 206 pounds  2. NICM EF 10-15%  3. A/c renal failure, stage IV (baseline Cr ~2.0-2.5)  4. Hyperkalemia  5. NSVT  6. N/V/diarrhea -  7. H/o polysubstance abuse  8. Noncompliance  9. Hyponatremia/Hypokalemia 10. Acute gout 11. NSVT 12. Hyokalemia  Plan/Discussion:     Volume status remains elevated but he wants to go home today.  Overall weight down 23 pounds. CO-OX stable.  Not a candidate for home inotropes due to drug abuse .    Stop IV lasix and start torsemide 60 bid with metolazone 2.5 at least once weekly and as needed for weight gain.   Will arrange HF follow up next week.   HF meds  Torsemide 60 mg twice a day Metolazone 2.5 mg every Wednesday.   Kdur 60 meq bid with and extra 20 meq on Wednesday.  Hydralazine 50 mg tid Imdur 30 mg bid   CLEGG,AMY,NP-C  7:42 AM  Patient seen and examined with Tonye Becket, NP. We discussed all aspects of the encounter. I agree with the assessment and plan as stated above.   Weight down another 10 pounds but CVP still up. Co-ox borderline. He wants to go home and I think this is reasonable. Will continue to follow him in HF Clinic. I am worried that it will be hard to keep him out of the hospital for very long. I appreciate the efforts of the IM Service throughout this entire hospitalization.   Daniel Bensimhon,MD 10:34 AM

## 2013-10-08 NOTE — Progress Notes (Signed)
Inpatient RN visit- [name] [facility] 2H Room 23-[facility] RN Visit-[name] RN  Related admission to [facility] diagnosis of hypertrophic cardiomyopathy. Pt is FULL  code.   Pt alert & oriented sitting on the side of the bed. Pt wants to go home today.  Reports his foot pain has improved enough for him to walk "around on". He remains on the lasix gtt, per chart review this is to be discontinued this am. Both Dr. Gala Romney and Dr. Beckie Salts  have talked with patient about discharge. Pt to have a dietary consult at his request.  Pt informed writer that he had a friend "with a cab" that he could call. He also requested a light weight wheel chair to have at home. Writer arranged for [facility] rep to bring wheel chair to pt's room for him to take in the cab with him at d/c.  Patient uses [facility], who delivers his medications, will need rx's sent to [facility] prior to d/c. [facility]  team notified of d/c plans.  Above information shared with both Dr. Beckie Salts,  [facility] Debbie and staff RN Marylene Land..  Patient's home medication list and transfer summary in place on shadow chart.  Please call [facility] @ [phone]  with any hospice needs.   Thank you. Hansel Starling, RN  [facility]  Hospice Liaison  [phone])

## 2013-10-15 ENCOUNTER — Encounter (HOSPITAL_COMMUNITY): Payer: Self-pay

## 2013-10-15 ENCOUNTER — Ambulatory Visit (HOSPITAL_COMMUNITY)
Admit: 2013-10-15 | Discharge: 2013-10-15 | Disposition: A | Discharge status: Home / Self Care | Source: Ambulatory Visit | Attending: Internal Medicine | Admitting: Internal Medicine

## 2013-10-15 VITALS — BP 108/78 | HR 108 | Resp 18 | Wt 214.1 lb

## 2013-10-15 DIAGNOSIS — I252 Old myocardial infarction: Secondary | ICD-10-CM | POA: Diagnosis not present

## 2013-10-15 DIAGNOSIS — I509 Heart failure, unspecified: Secondary | ICD-10-CM | POA: Diagnosis present

## 2013-10-15 DIAGNOSIS — N183 Chronic kidney disease, stage 3 unspecified: Secondary | ICD-10-CM | POA: Diagnosis not present

## 2013-10-15 DIAGNOSIS — I2589 Other forms of chronic ischemic heart disease: Secondary | ICD-10-CM | POA: Diagnosis not present

## 2013-10-15 DIAGNOSIS — K219 Gastro-esophageal reflux disease without esophagitis: Secondary | ICD-10-CM | POA: Insufficient documentation

## 2013-10-15 DIAGNOSIS — F1411 Cocaine abuse, in remission: Secondary | ICD-10-CM

## 2013-10-15 DIAGNOSIS — I5022 Chronic systolic (congestive) heart failure: Secondary | ICD-10-CM | POA: Diagnosis not present

## 2013-10-15 DIAGNOSIS — I129 Hypertensive chronic kidney disease with stage 1 through stage 4 chronic kidney disease, or unspecified chronic kidney disease: Secondary | ICD-10-CM | POA: Diagnosis not present

## 2013-10-15 DIAGNOSIS — I2510 Atherosclerotic heart disease of native coronary artery without angina pectoris: Secondary | ICD-10-CM | POA: Diagnosis not present

## 2013-10-15 DIAGNOSIS — F1410 Cocaine abuse, uncomplicated: Secondary | ICD-10-CM | POA: Diagnosis not present

## 2013-10-15 DIAGNOSIS — Z9861 Coronary angioplasty status: Secondary | ICD-10-CM | POA: Diagnosis not present

## 2013-10-15 DIAGNOSIS — Z79899 Other long term (current) drug therapy: Secondary | ICD-10-CM | POA: Insufficient documentation

## 2013-10-15 LAB — BASIC METABOLIC PANEL
Anion gap: 12 (ref 5–15)
BUN: 82 mg/dL — AB (ref 6–23)
CHLORIDE: 87 meq/L — AB (ref 96–112)
CO2: 32 meq/L (ref 19–32)
Calcium: 9.3 mg/dL (ref 8.4–10.5)
Creatinine, Ser: 2.19 mg/dL — ABNORMAL HIGH (ref 0.50–1.35)
GFR calc Af Amer: 40 mL/min — ABNORMAL LOW (ref >90)
GFR calc non Af Amer: 34 mL/min — ABNORMAL LOW (ref >90)
GLUCOSE: 224 mg/dL — AB (ref 70–99)
POTASSIUM: 3.7 meq/L (ref 3.7–5.3)
Sodium: 131 mEq/L — ABNORMAL LOW (ref 137–147)

## 2013-10-15 LAB — PRO B NATRIURETIC PEPTIDE: Pro B Natriuretic peptide (BNP): 8239 pg/mL — ABNORMAL HIGH (ref 0–125)

## 2013-10-15 NOTE — Progress Notes (Signed)
Patient ID: [name], male   DOB: [birth year], 48 y.o.   MRN: [id number] Psychiatrist: Dr Anthony Sar PCP: Dr Loistine Chance  HPI: [name] is a 48 y/o male with HTN and DM2 with longstanding HF due to NICM (probably ETOH), EF 20%. Current cocaine abuse. Cath in 2008 showed left circumflex with 40% stenosis. Baseline weight about 210. Was previously on Hospice Care but this ended in December in 2013 as he was doing well.   Admitted 03/2011 with NYHA Class IV symptoms, requiring milrinone therapy and IV lasix. Cath 04/13/11: cardiogenic shock with biventricular failure (R>L) and possible restrictive physiology. RA = 22 with steep y-descents, RV = 45/12/25, PA = 51/25 (34), PCW = 18-20, Fick cardiac output/index = 3.1/1.4, Thermo CO/CI = 3.6/1.6, PVR = 4.8 [place] O2 sat = 85% (after sedation), PA sat = 31%, 36%. Milrinone was initiated for low output physiology.   Admitted to [facility] 06/2012 witih CP/elevated troponin and increased SOB. He had a +USD for cocaine. He also had LHC 07/17/12 showing subtotal thrombotic occlusion of the distal OM1. It was too small at this point for PCI and was started on plavix.  Echo showed EF 10% with no LV thrombus and moderately decreased RV systolic function.  His d/c weight was 206 and now 228 lbs, though this is lower than 230 which was his weight at last appointment in this office.  Admitted to [facility] 08/04/12 through 08/09/12 NSTEMI thought to be from non st elevation MI from cocaine induced vasospasm and volume overload. + cocaine. Dry weight 205-208 pounds. Discharge weight 208 pounds.   Admitted June 9 through June 18 with volume overload and low output heart failure.  He required lasix drip and milrinone. As he improved he was transitioned  to torsemide 60 mg twice a day. Discharge weight 206 pounds.   Admitted August 28 through 10/08/13 with low output heart failure. Required Milrinone + Lasix drip. Milrinone was weaned off. He transitioned to torsemide 60 mg twice a day. Not  discharged on BB due to drug use. Discharged with Hospice. Discharge weight was 208 pounds.   He returns for follow up. Overall feels ok. Does have mild dyspnea with exertion. Uses wheelchair when he leaves the house. Weight at home 214 pounds. Taking all medications. Followed by [facility]. Following low salt  diet and limiting fluid intake to < 2 liters per day.     7/14: K 4.8, creatinine 2 08/09/12 Potassium 4.2 Creatinine 2.0 8/14: K 4, creatinine 1.6, digoxin 0.8 11/02/12: K+ 3.3, Creatinine 1.89, cholesterol 98 12/11/12 K 3.1 Creatinine 2.26 Pro BNP 2957  07/05/13 K 3.5 Creatinine 2.44  07/12/13 K 3.3 Creatinine 2.27 Pro BNP 5773  07/26/13 K 4.4 Creatinine 2.25 10/08/13 K 4.0 Creatinine 2.26   SH: Lives in low income apartment. 3 children, lives in [place].  + cocaine abuse until recently, says he has quit.  Quit smoking.  FH: No premature CAD.  ROS: All systems negative except as listed in HPI, PMH and Problem List.  Past Medical History  Diagnosis Date  . Hypertension   . CHF (congestive heart failure)     a. 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 b. EF 20-25%, s/p [employer] AICD placement 04/2007. c. Echo 06/2012: EF 10%.  . History of peptic ulcer disease     EGD 09/2007 shows prox gastric ulcer with black eschar, treated with PPI  . Depression   . History of alcohol abuse     quit  approx 2011-2012  . OSA (obstructive sleep apnea)     Intolerant of CPAP  . ICD (implantable cardiac defibrillator) in place   . Diabetes mellitus     insulin dependent  . GERD (gastroesophageal reflux disease)   . Headache(784.0)   . Myasthenia gravis 10/12/2011    Diagnosed in 09/2011. Received Plasmapheresis. CT chest no Thymoma. Acetylcholine receptor antibody negative.    . CKD (chronic kidney disease) stage 3, GFR 30-59 ml/min   . Polysubstance abuse     Including cocaine (+ UDS multiple times in 2014)  . PUD (peptic ulcer disease)   . Pulmonary nodule     a. CT  09/2011.  Marland Kitchen CAD (coronary artery disease)     a. NSTEMI 06/2012: cath showed subtotal thrombotic occlusion of the distal OM1, too small for PCI, ?cocaine-triggered plaque rupture with thrombosis (no LV thrombus on f/u echo).   - NSTEMI (6/14) with subtotal thrombotic occlusion of a distal OM1, too small for intervention.  No LV thrombus on echo.   Current Outpatient Prescriptions  Medication Sig Dispense Refill  . allopurinol (ZYLOPRIM) 100 MG tablet Take 2 tablets (200 mg total) by mouth daily.  30 tablet  6  . aspirin EC 81 MG EC tablet Take 1 tablet (81 mg total) by mouth daily.  30 tablet  6  . atorvastatin (LIPITOR) 40 MG tablet Take 80 mg by mouth daily.      Marland Kitchen gabapentin (NEURONTIN) 100 MG capsule Take 2 capsules (200 mg total) by mouth 3 (three) times daily.  90 capsule  2  . hydrALAZINE (APRESOLINE) 50 MG tablet Take 1 tablet (50 mg total) by mouth 3 (three) times daily.  90 tablet  2  . insulin aspart (NOVOLOG) 100 UNIT/ML injection Inject 9 Units into the skin 3 (three) times daily with meals.  10 mL  11  . insulin glargine (LANTUS) 100 UNIT/ML injection Inject 0.2 mLs (20 Units total) into the skin at bedtime.  10 mL  11  . Insulin Syringes, Disposable, (B-D INSULIN SYRINGE 1CC) U-100 1 ML MISC Use for insulin  100 each  3  . isosorbide mononitrate (IMDUR) 30 MG 24 hr tablet Take 1 tablet (30 mg total) by mouth 2 (two) times daily.  60 tablet  2  . LORazepam (ATIVAN) 1 MG tablet Take 1 mg by mouth 2 (two) times daily as needed for anxiety or sleep.       . metolazone (ZAROXOLYN) 2.5 MG tablet Take 2.5 mg by mouth once a week. On Wednesdays      . ondansetron (ZOFRAN) 4 MG tablet Take 1 tablet (4 mg total) by mouth every 8 (eight) hours as needed for nausea or vomiting.  20 tablet  0  . Oxycodone HCl 10 MG TABS Take 10 mg by mouth every 8 (eight) hours as needed (pain).       . potassium chloride SA (K-DUR,KLOR-CON) 20 MEQ tablet Take 60 mEq by mouth See admin instructions. Take 3  tablets (60 meq) two times daily, take an extra tablet (20 meq) on Wednesdays with metolazone      . ranitidine (ZANTAC) 150 MG tablet Take 150 mg by mouth daily.      Marland Kitchen torsemide (DEMADEX) 20 MG tablet Take 60 mg by mouth 2 (two) times daily.       . [DISCONTINUED] metoprolol tartrate (LOPRESSOR) 25 MG tablet Take 1 tablet (25 mg total) by mouth 2 (two) times daily.  60 tablet  1  . [DISCONTINUED] traZODone (  DESYREL) 25 mg TABS Take 0.5 tablets (25 mg total) by mouth at bedtime.  30 tablet  1   No current facility-administered medications for this encounter.   PHYSICAL EXAM: Filed Vitals:   10/15/13 0858  BP: 108/78  Pulse: 108  Resp: 18  Weight: 214 lb 2 oz (97.126 kg)  SpO2: 100%   General:  Chronically ill appearing.  No resp difficulty; Ambulated slowly into clinic  HEENT: normal Neck: supple. JVP 7-8. Carotids 2+ bilaterally; no bruits. No lymphadenopathy or thryomegaly appreciated. Cor: PMI normal. Regular rate & rhythm. 2/6 systolic murmur LLSB. + S3  Lungs: clear Abdomen: obese, soft, nontender, mildly distended. No hepatosplenomegaly. No bruits or masses. Good bowel sounds. Extremities: no cyanosis, clubbing, rash.  R and LLE trace ankle edema. Neuro: alert & orientedx3, cranial nerves grossly intact. Moves all 4 extremities w/o difficulty. Affect pleasant.when  ASSESSMENT & PLAN: [facility] PATIENT  1. CHF: Chronic systolic CHF, nonischemic cardiomyopathy.  Possibly related to cocaine abuse and prior ETOH abuse. ICD remains on. NYHA IIIB. Marland Kitchen  Volume status mildly elevated. Continue torsemide 60 mg twice a day and metolazone 2.5 mg once a week.  Continue 40 meq potassium twice a day.  Keep off BB. Due to ongoing cocaine abuse.    -Has ICD. Discussed turning it off as he is a [facility] patient however he would like it remain on.   -Continue  hydralazine 50 mg TID and imdur 30 mg daily.    - No ACEI /spiro with CKD   Check BMET and Pro BNP today  -  Reinforced low salt food choices, limiting fluid intake and medication compliance.  Continue [facility] . I have notified [facility] regarding todays visit.  2. Cocaine abuse: Last admit + for cocaine. Encouraged to remain off cocaine.  3. CKD, stage III: Not on ACE-I or Spiro.   Follow up in 1 month   CLEGG,AMY NP-C 9:01 AM

## 2013-10-15 NOTE — Patient Instructions (Signed)
Follow up in 1 month   Do the following things EVERYDAY: 1) Weigh yourself in the morning before breakfast. Write it down and keep it in a log. 2) Take your medicines as prescribed 3) Eat low salt foods-Limit salt (sodium) to 2000 mg per day.  4) Stay as active as you can everyday 5) Limit all fluids for the day to less than 2 liters 

## 2013-10-16 ENCOUNTER — Encounter: Payer: Self-pay | Admitting: Internal Medicine

## 2013-10-16 ENCOUNTER — Ambulatory Visit (INDEPENDENT_AMBULATORY_CARE_PROVIDER_SITE_OTHER): Admitting: Internal Medicine

## 2013-10-16 VITALS — BP 86/61 | HR 100 | Temp 98.3°F | Wt 218.6 lb

## 2013-10-16 DIAGNOSIS — I2510 Atherosclerotic heart disease of native coronary artery without angina pectoris: Secondary | ICD-10-CM

## 2013-10-16 DIAGNOSIS — I5022 Chronic systolic (congestive) heart failure: Secondary | ICD-10-CM

## 2013-10-16 DIAGNOSIS — M6740 Ganglion, unspecified site: Secondary | ICD-10-CM

## 2013-10-16 DIAGNOSIS — I509 Heart failure, unspecified: Secondary | ICD-10-CM

## 2013-10-16 DIAGNOSIS — E1129 Type 2 diabetes mellitus with other diabetic kidney complication: Secondary | ICD-10-CM

## 2013-10-16 LAB — POCT GLYCOSYLATED HEMOGLOBIN (HGB A1C): HEMOGLOBIN A1C: 10.1

## 2013-10-16 LAB — GLUCOSE, CAPILLARY: GLUCOSE-CAPILLARY: 288 mg/dL — AB (ref 70–99)

## 2013-10-16 NOTE — Assessment & Plan Note (Addendum)
His weight is stable, 214lbs per his home scale, he assures compliance with his diuretics and low sodium diet with fluid restriction. He was seen by HF outpt team on 9/28 and will follow up with them next month.  -Sent message to our secretary, Shon Hough, for coordination of power wheelchair appointment with physician and rep. Pt is aware and will await the appointment.

## 2013-10-16 NOTE — Assessment & Plan Note (Signed)
Lab Results  Component Value Date   HGBA1C 10.1 10/16/2013   HGBA1C 13.5* 07/03/2013   HGBA1C 10.9 05/10/2013     Assessment: Diabetes control:  Not controlled Progress toward A1C goal:   Not at goal Comments: His Hg A1C has much improved. He is compliant to Lantus 20 units qHS, Novolog 9 units TID ac.   Plan: Medications:  continue current medications Home glucose monitoring: Frequency:   Timing:   Instruction/counseling given: reminded to get eye exam, reminded to bring blood glucose meter & log to each visit and reminded to bring medications to each visit Educational resources provided: brochure;handout Self management tools provided:   Other plans: Follow up in 2 weeks.

## 2013-10-16 NOTE — Progress Notes (Signed)
   Subjective:    Patient ID: [name], male    DOB: 11/04/1965, 48 y.o.   MRN: [id number]  HPI Mr. Abadi is a 48 year old man with PMH of HTN, DM2, with longstanding HF due to NICM (probably ETOH), EF 20%, current cocaine abuse, on Hospice Care but full code with multiple hospitalizations for acute on chronic HF presenting for HFU. He has a 3.5 week hospitalization recently with discharge on 10/06/13 for acute on chronic systolic heart failure and cardiorenal syndrome. He had a complicated hospital course with Milrinone + Lasix drip, ad a gout flare up, possible early diverticulitis and electrolyte derangement with low sodium and high/low potassium.  Milrinone was weaned off. He transitioned to torsemide 60 mg twice a day. Not discharged on BB due to drug use. He has discharged back to [facility] care--though remains full code. Discharge weight was 208 pounds.  Since his discharge home he states that he is feeling well and is compliant with his diuretics and a low salt diet with fluid restriction to 2L per day. He had follow up appointment with the HF team with no medication changes.  Today he complains of a knot in his left dorsal hand that has been present since the beginning of the year and is getting bigger, tender, making it difficult for him to flex his left wrist or to use his left hand--he is left handed.  He requests a power wheelchair to help him be more mobile as he is unable to ambulate due to severe SOB and is not able to push himself in a regular wheelchair.    Review of Systems  Constitutional: Positive for fatigue. Negative for fever, chills, diaphoresis, activity change, appetite change and unexpected weight change.  Respiratory: Positive for shortness of breath. Negative for wheezing.   Cardiovascular: Positive for leg swelling. Negative for chest pain and palpitations.  Gastrointestinal: Negative for abdominal pain.  Genitourinary: Negative for dysuria and difficulty  urinating.  Musculoskeletal:       Left hand cyst  Skin: Negative for color change, pallor, rash and wound.  Neurological: Negative for dizziness and headaches.  Psychiatric/Behavioral: Negative for agitation.       Objective:   Physical Exam  Nursing note and vitals reviewed. Constitutional: He is oriented to person, place, and time. No distress.  Sitting in wheelchair  Cardiovascular: Normal rate.   Pulmonary/Chest: Effort normal. No respiratory distress. He has no wheezes. He has no rales.  Musculoskeletal: He exhibits edema and tenderness.  He has 1+ pitting edema bilaterally up to his knees.  He has left dorsal hand ganglion cyst that is TTP with mild surrounding edema.   Neurological: He is alert and oriented to person, place, and time.  Skin: Skin is warm and dry. He is not diaphoretic.  Psychiatric:  Flat affect          Assessment & Plan:

## 2013-10-16 NOTE — Progress Notes (Signed)
INTERNAL MEDICINE TEACHING ATTENDING ADDENDUM - Chantale Leugers, MD: I reviewed and discussed at the time of visit with the resident Dr. Kennerly, the patient's medical history, physical examination, diagnosis and results of pertinent tests and treatment and I agree with the patient's care as documented.  

## 2013-10-16 NOTE — Patient Instructions (Signed)
-  You have a ganglion cyst in your left hand. You have been referred to Sports Medicine to help aspirate this cyst.  -You will be contacted in regards to the appointment for the power wheelchair.  -Follow up with the Heart Failure team as needed if you notice your weight is going up.    Please bring your medicines with you each time you come.   Medicines may be  Eye drops  Herbal   Vitamins  Pills  Seeing these help [place] take care of you.

## 2013-10-18 ENCOUNTER — Other Ambulatory Visit: Payer: Self-pay | Admitting: *Deleted

## 2013-10-18 MED ORDER — INSULIN SYRINGES (DISPOSABLE) U-100 1 ML MISC: Status: AC

## 2013-10-19 ENCOUNTER — Ambulatory Visit: Payer: Medicare Other | Admitting: Family Medicine

## 2013-10-23 ENCOUNTER — Telehealth (HOSPITAL_COMMUNITY): Payer: Self-pay

## 2013-10-23 NOTE — Telephone Encounter (Signed)
Joni Reining RN with [facility] called concering patient's weight of 220lb 8oz, 3+ BLEE, and fine crackles in lung baes bilaterally.  No noted SOB or change in activity.  Advised to have patient take extra 2.5mg  metolazone tablet 30 minutes before afternoon torsemide dose, and initiate furosemide sliding scale standing order that they have on file for patient.  Will call us tomorrow to reevaluate patient condition. [address] Filter

## 2013-10-24 ENCOUNTER — Telehealth (HOSPITAL_COMMUNITY): Payer: Self-pay | Admitting: Vascular Surgery

## 2013-10-24 ENCOUNTER — Encounter: Payer: Self-pay | Admitting: Family Medicine

## 2013-10-24 ENCOUNTER — Ambulatory Visit (INDEPENDENT_AMBULATORY_CARE_PROVIDER_SITE_OTHER): Admitting: Family Medicine

## 2013-10-24 VITALS — BP 100/68 | HR 99 | Ht 72.0 in | Wt 228.0 lb

## 2013-10-24 DIAGNOSIS — M79642 Pain in left hand: Secondary | ICD-10-CM

## 2013-10-24 DIAGNOSIS — M25532 Pain in left wrist: Secondary | ICD-10-CM | POA: Diagnosis not present

## 2013-10-24 DIAGNOSIS — I2510 Atherosclerotic heart disease of native coronary artery without angina pectoris: Secondary | ICD-10-CM

## 2013-10-24 MED ORDER — METHYLPREDNISOLONE ACETATE 40 MG/ML IJ SUSP
40.0000 mg | Freq: Once | INTRAMUSCULAR | Status: AC
  Administered 2013-10-24: 40 mg via INTRA_ARTICULAR

## 2013-10-24 NOTE — Telephone Encounter (Signed)
Spoke w/[phone]) Hospice RN she states she is on her way to pt's home, he states wt was 220 yesterday and was advised to take metolazone, he states he did take but wt today is 228 lb and he is very SOB, discussed w/Ali Elsie Ra, NP will give pt 80 mg IV lasix today, called and spoke to [facility] pharmacy they will send out med tonight, Joni Reining is aware and will have an RN go out later tonight but she is still concerned about his SOB, advised if he feels he can't wait for medication then he will have to come to ER

## 2013-10-24 NOTE — Patient Instructions (Signed)
You have an extensor tendinitis of your hand/wrist.  On ultrasound this area of swelling does not correspond to a ganglion cyst so the fluid cannot be removed - there appears to be some scar tissue and/or clotted blood in this area. Ice area 15 minutes at a time 3-4 times a day. Wear wrist brace regularly if tolerated to help rest the area for 4-6 weeks. You were given a cortisone shot into the tendon sheath today. If you're not improving after a week call us and we can have you see a surgeon though with your heart condition, I do not believe surgery is an option for this.

## 2013-10-24 NOTE — Telephone Encounter (Signed)
Pt weight is up to 228 .Marland Kitchen SOB

## 2013-10-25 ENCOUNTER — Telehealth (HOSPITAL_COMMUNITY): Payer: Self-pay | Admitting: Cardiology

## 2013-10-25 ENCOUNTER — Encounter: Payer: Self-pay | Admitting: Family Medicine

## 2013-10-25 DIAGNOSIS — M25532 Pain in left wrist: Secondary | ICD-10-CM | POA: Insufficient documentation
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
## 2013-10-25 NOTE — Assessment & Plan Note (Signed)
consistent with extensor tendinitis as his primary issue.  This is not consistent with a ganglion cyst as initially suspected.  He has either a hematoma or some scar tissue superficial to tendons in area of pain.  Discussed his options and decided to go ahead with tendon sheath injection as well as wrist bracing, icing.  He stated he wants the swollen area removed - emphasized we are not surgeons and given his poor heart function, I do not think he is a surgical candidate.  Regardless if not improving can consider referral to orthopedics for their input.  After informed written consent patient was seated on exam table.  Patient's left dorsal wrist over area of swelling prepped with alcohol swab after ultrasound identified extensor tendon sheath then injected with 2:1 marcaine:depomedrol.  Patient tolerated procedure well without immediate complications.

## 2013-10-25 NOTE — Telephone Encounter (Signed)
Pt called with concerns regarding weight not dropping. SOB still the same Pt did receive IV Lasix in the home last night, not much output Pt does not want to return to the hospital as he was just discharged  Per vo Dr.Bensimhon/Ali Cosgrove, NP  Pt should increase Torsemide to 100 mg BID and Potassium 80 MeQ BID until pt returns for follow up

## 2013-10-26 ENCOUNTER — Encounter: Payer: Self-pay | Admitting: *Deleted

## 2013-10-29 ENCOUNTER — Telehealth (HOSPITAL_COMMUNITY): Payer: Self-pay | Admitting: Vascular Surgery

## 2013-10-29 MED ORDER — TORSEMIDE 20 MG PO TABS: 100.0000 mg | ORAL_TABLET | Freq: Two times a day (BID) | ORAL | Status: AC

## 2013-10-29 MED ORDER — ATORVASTATIN CALCIUM 80 MG PO TABS: 80.0000 mg | ORAL_TABLET | Freq: Every day | ORAL | Status: AC

## 2013-10-29 NOTE — Telephone Encounter (Signed)
Pt need refill Lipitor.. Pt is concerned about his weight .[name] Pt has a lot of edema.[name] He cant sleep at night he would like a sleep aid if possible.Jonathan KitchenMarland [name]

## 2013-10-29 NOTE — Telephone Encounter (Signed)
Spoke w/Nichole she states pt looks better today than he did last week, wt was 228 lb today and he states he still feels bad however he ate potato chips yesterday and missed a couple of doses of torsemide over the weekend, advised pt needs to take 100 mg Twice daily and watch salt intake, refill for Lipitor sent in, hospice MD needs to give sleep med if needed, Arlina Robes is aware and agreeable

## 2013-11-01 ENCOUNTER — Telehealth (HOSPITAL_COMMUNITY): Payer: Self-pay | Admitting: Vascular Surgery

## 2013-11-01 MED ORDER — METOLAZONE 5 MG PO TABS: 5.0000 mg | ORAL_TABLET | Freq: Every day | ORAL | Status: DC

## 2013-11-01 NOTE — Telephone Encounter (Signed)
Spoke w/pt, his wt is up to 233 lb, he states he was 208 when he was discharged from the hospital and 228 last week and was given IV lasix, he states he is taking torsemide 100 mg bid and metolazone 2.5 mg on Mon and Wed, per Tonye Becket, NP start metolazone 5 mg daily, rx sent into pharmacy

## 2013-11-01 NOTE — Telephone Encounter (Signed)
Nurse from Hospice left message.Marland Kitchen Update.. Pt weight 233.2 , plus 4 lower extrem edema. abd edema, bp 98/63 , O2 99%. HR 103.Marland Kitchen

## 2013-11-05 ENCOUNTER — Other Ambulatory Visit (HOSPITAL_COMMUNITY)
Admission: RE | Admit: 2013-11-05 | Discharge: 2013-11-05 | Disposition: A | Discharge status: Home / Self Care | Payer: Medicare Other | Source: Ambulatory Visit | Attending: Internal Medicine | Admitting: Internal Medicine

## 2013-11-05 ENCOUNTER — Ambulatory Visit (INDEPENDENT_AMBULATORY_CARE_PROVIDER_SITE_OTHER): Admitting: Internal Medicine

## 2013-11-05 ENCOUNTER — Encounter: Payer: Self-pay | Admitting: Internal Medicine

## 2013-11-05 ENCOUNTER — Other Ambulatory Visit: Payer: Medicare Other

## 2013-11-05 VITALS — BP 108/69 | HR 103 | Temp 97.8°F | Ht 72.0 in | Wt 225.1 lb

## 2013-11-05 DIAGNOSIS — E1129 Type 2 diabetes mellitus with other diabetic kidney complication: Secondary | ICD-10-CM

## 2013-11-05 DIAGNOSIS — I5022 Chronic systolic (congestive) heart failure: Secondary | ICD-10-CM

## 2013-11-05 DIAGNOSIS — Z113 Encounter for screening for infections with a predominantly sexual mode of transmission: Secondary | ICD-10-CM | POA: Insufficient documentation

## 2013-11-05 DIAGNOSIS — I10 Essential (primary) hypertension: Secondary | ICD-10-CM

## 2013-11-05 LAB — BASIC METABOLIC PANEL WITH GFR
BUN: 56 mg/dL — ABNORMAL HIGH (ref 6–23)
CO2: 30 mEq/L (ref 19–32)
Calcium: 9.6 mg/dL (ref 8.4–10.5)
Chloride: 89 mEq/L — ABNORMAL LOW (ref 96–112)
Creat: 2.21 mg/dL — ABNORMAL HIGH (ref 0.50–1.35)
GFR, EST AFRICAN AMERICAN: 40 mL/min — AB
GFR, Est Non African American: 34 mL/min — ABNORMAL LOW
Glucose, Bld: 324 mg/dL — ABNORMAL HIGH (ref 70–99)
POTASSIUM: 3.9 meq/L (ref 3.5–5.3)
SODIUM: 133 meq/L — AB (ref 135–145)

## 2013-11-05 LAB — POCT GLYCOSYLATED HEMOGLOBIN (HGB A1C): HEMOGLOBIN A1C: 9.9

## 2013-11-05 LAB — RPR

## 2013-11-05 LAB — GLUCOSE, CAPILLARY: GLUCOSE-CAPILLARY: 297 mg/dL — AB (ref 70–99)

## 2013-11-05 LAB — LIPID PANEL
CHOLESTEROL: 102 mg/dL (ref 0–200)
HDL: 32 mg/dL — ABNORMAL LOW (ref >39)
LDL Cholesterol: 56 mg/dL (ref 0–99)
TRIGLYCERIDES: 69 mg/dL (ref <150)
Total CHOL/HDL Ratio: 3.2 Ratio
VLDL: 14 mg/dL (ref 0–40)

## 2013-11-05 NOTE — Patient Instructions (Signed)
General Instructions:  Keep taking the metalozone 5mg  daily and torsemide 100mg  bid along with your other blood pressure and heart failure medications   You will need to follow up with HF team next week as scheduled 10/28  Weigh yourself daily and if your weight continues to increase please call HF team or our office as you may need to be admitted to the hospital. If your SOB gets worse or chest pain, or worsening edema, please call us or go to ED if severe  Continue to work on your diet and fluid restrictions  Please bring your medicines with you each time you come to clinic.  Medicines may include prescription medications, over-the-counter medications, herbal remedies, eye drops, vitamins, or other pills.   Progress Toward Treatment Goals:  Treatment Goal 07/31/2013  Hemoglobin A1C -  Blood pressure at goal  Stop smoking -  Prevent falls -    Self Care Goals & Plans:  Self Care Goal 11/05/2013  Manage my medications take my medicines as prescribed; bring my medications to every visit; refill my medications on time  Monitor my health keep track of my blood glucose; bring my glucose meter and log to each visit  Eat healthy foods eat foods that are low in salt; eat baked foods instead of fried foods  Be physically active -    Home Blood Glucose Monitoring 07/31/2013  Check my blood sugar 3 times a day  When to check my blood sugar before meals     Care Management & Community Referrals:  Referral 07/10/2012  Referrals made for care management support -  Referrals made to community resources noncommercial health insurance options; transportation

## 2013-11-06 DIAGNOSIS — Z113 Encounter for screening for infections with a predominantly sexual mode of transmission: Secondary | ICD-10-CM | POA: Insufficient documentation

## 2013-11-06 LAB — URINE CYTOLOGY ANCILLARY ONLY
CHLAMYDIA, DNA PROBE: NEGATIVE
Neisseria Gonorrhea: NEGATIVE

## 2013-11-06 LAB — MICROALBUMIN / CREATININE URINE RATIO
Creatinine, Urine: 50.5 mg/dL
MICROALB/CREAT RATIO: 320.8 mg/g — AB (ref 0.0–30.0)
Microalb, Ur: 16.2 mg/dL — ABNORMAL HIGH (ref <2.0)

## 2013-11-06 LAB — HIV ANTIBODY (ROUTINE TESTING W REFLEX): HIV: NONREACTIVE

## 2013-11-06 NOTE — Progress Notes (Signed)
Case discussed with Dr. Qureshi soon after the resident saw the patient.  We reviewed the resident's history and exam and pertinent patient test results.  I agree with the assessment, diagnosis, and plan of care documented in the resident's note. 

## 2013-11-06 NOTE — Assessment & Plan Note (Addendum)
Volume overloaded today and s/p recent hospital admission with discharge on 9/21 and discharge weight at that time was 208lb's ~his baseline.  Today weight up to 225lb's which is actually down from 233 4 days ago since increasing metolazone to 5mg  daily and continuing torsemide 100mg  bid. He does not wish to be admitted to the hospital today and would like to prevent it as much as possible.  I talked to Amy from HF team on the phone in regards to his wishes and current weight.    -we will continue current regimen of aggressive diuresis and recommend daily weights at home to be reported to HF clinic in the meantime, with follow up already scheduled for 10/28.  The hope is that he will continue to diurese well until then and we will try to keep him out of the hospital if possible.  However, he has been advised that if symptoms worsen, chest pain, SOB, worsening edema or weight gain he will need to let us know and will likely be admitted at that time. He acknowledges understanding.  -I have continued to counsel him on regulating his diet -BMET today

## 2013-11-06 NOTE — Assessment & Plan Note (Signed)
BP Readings from Last 3 Encounters:  11/05/13 108/69  10/24/13 100/68  10/16/13 86/61   Lab Results  Component Value Date   NA 133* 11/05/2013   K 3.9 11/05/2013   CREATININE 2.21* 11/05/2013   Assessment: Comments: controlled, at goal with aggressive diuresis  Plan: Medications:  continue current medications torsemide 100mg  bid, metolazone 5mg  daily, imdur 30mg  bid, hydralazine 50mg  tid Educational resources provided:   Self management tools provided:   Other plans: monitor BP at home
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
## 2013-11-06 NOTE — Assessment & Plan Note (Addendum)
Screening requested per patient for STI's including HIV, syphilis, gonorrhea, and chlamydia today.  Recent unprotected sex with male who he does not know her STI status.   -HIV, RPR non-reactive -urine cytology for gonorrhea and chlamydia negative -medicare form printed, reviewed with patient and mailed to patient as well

## 2013-11-06 NOTE — Assessment & Plan Note (Signed)
Lab Results  Component Value Date   HGBA1C 9.9 11/05/2013   HGBA1C 10.1 10/16/2013   HGBA1C 13.5* 07/03/2013    Assessment: Diabetes control:  poor Progress toward A1C goal:   slightly improved Comments: did not bring in meter or medications today  Plan: Medications:  continue current medications lantus 20 units qhs and novolog 9 units tid Home glucose monitoring: Frequency:   Timing:   Instruction/counseling given: reminded to get eye exam, reminded to bring blood glucose meter & log to each visit, reminded to bring medications to each visit and discussed diet Educational resources provided:   Self management tools provided:   Other plans: no time for eye exam today, will have to reschedule

## 2013-11-13 NOTE — Progress Notes (Signed)
Patient ID: [name], male   DOB: 06-Dec-1965, 48 y.o.   MRN: [id number] Psychiatrist: Dr Anthony Sar PCP: Dr Loistine Chance  HPI: [name] is a 48 y/o male with HTN and DM2 with longstanding HF due to NICM (probably ETOH), EF 20%. Current cocaine abuse. Cath in 2008 showed left circumflex with 40% stenosis. Baseline weight about 210. Was previously on [facility] but this ended in December in 2013 as he was doing well.   Admitted 03/2011 with NYHA Class IV symptoms, requiring milrinone therapy and IV lasix. Cath 04/13/11: cardiogenic shock with biventricular failure (R>L) and possible restrictive physiology. RA = 22 with steep y-descents, RV = 45/12/25, PA = 51/25 (34), PCW = 18-20, Fick cardiac output/index = 3.1/1.4, Thermo CO/CI = 3.6/1.6, PVR = 4.8 Woods O2 sat = 85% (after sedation), PA sat = 31%, 36%. Milrinone was initiated for low output physiology.   Admitted to [facility] 06/2012 witih CP/elevated troponin and increased SOB. He had a +USD for cocaine. He also had LHC 07/17/12 showing subtotal thrombotic occlusion of the distal OM1. It was too small at this point for PCI and was started on plavix.  Echo showed EF 10% with no LV thrombus and moderately decreased RV systolic function.  His d/c weight was 206 and now 228 lbs, though this is lower than 230 which was his weight at last appointment in this office.  Admitted to [facility] 08/04/12 through 08/09/12 NSTEMI thought to be from non st elevation MI from cocaine induced vasospasm and volume overload. + cocaine. Dry weight 205-208 pounds. Discharge weight 208 pounds.   Admitted June 9 through June 18 with volume overload and low output heart failure.  He required lasix drip and milrinone. As he improved he was transitioned  to torsemide 60 mg twice a day. Discharge weight 206 pounds.   Admitted August 28 through 10/08/13 with low output heart failure. Required Milrinone + Lasix drip. Milrinone was weaned off. He transitioned to torsemide 60 mg twice a day. Not  discharged on BB due to drug use. Discharged with Hospice. Discharge weight was 208 pounds.   He returns for follow up. Weight at home down from 234 to 216 pounds. Complaining of nausea and vomiting. Appetite poor.  Wants to move to [facility] with his sister and children. Taking all medications. Followed by [facility]. Following low salt diet and limiting fluid intake to < 2 liters per day. Not doing drugs or drinking alcohol.      7/14: K 4.8, creatinine 2 08/09/12 Potassium 4.2 Creatinine 2.0 8/14: K 4, creatinine 1.6, digoxin 0.8 11/02/12: K+ 3.3, Creatinine 1.89, cholesterol 98 12/11/12 K 3.1 Creatinine 2.26 Pro BNP 2957  07/05/13 K 3.5 Creatinine 2.44  07/12/13 K 3.3 Creatinine 2.27 Pro BNP 5773  07/26/13 K 4.4 Creatinine 2.25 10/08/13 K 4.0 Creatinine 2.26  11/05/13 K 3.9 Creatinine 2.21 Hgb A1C 9.9   SH: Lives in low income apartment. 3 children, lives in [place].  + cocaine abuse until recently, says he has quit.  Quit smoking.  FH: No premature CAD.  ROS: All systems negative except as listed in HPI, PMH and Problem List.  Past Medical History  Diagnosis Date  . Hypertension   . CHF (congestive heart failure)     a. 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 b. EF 20-25%, s/p [employer] Jude AICD placement 04/2007. c. Echo 06/2012: EF 10%.  . History of peptic ulcer disease     EGD 09/2007 shows prox gastric ulcer  with black eschar, treated with PPI  . Depression   . History of alcohol abuse     quit approx 2011-2012  . OSA (obstructive sleep apnea)     Intolerant of CPAP  . ICD (implantable cardiac defibrillator) in place   . Diabetes mellitus     insulin dependent  . GERD (gastroesophageal reflux disease)   . Headache(784.0)   . Myasthenia gravis 10/12/2011    Diagnosed in 09/2011. Received Plasmapheresis. CT chest no Thymoma. Acetylcholine receptor antibody negative.    . CKD (chronic kidney disease) stage 3, GFR 30-59 ml/min   . Polysubstance abuse      Including cocaine (+ UDS multiple times in 2014)  . PUD (peptic ulcer disease)   . Pulmonary nodule     a. CT 09/2011.  Marland Kitchen CAD (coronary artery disease)     a. NSTEMI 06/2012: cath showed subtotal thrombotic occlusion of the distal OM1, too small for PCI, ?cocaine-triggered plaque rupture with thrombosis (no LV thrombus on f/u echo).   - NSTEMI (6/14) with subtotal thrombotic occlusion of a distal OM1, too small for intervention.  No LV thrombus on echo.   Current Outpatient Prescriptions  Medication Sig Dispense Refill  . allopurinol (ZYLOPRIM) 100 MG tablet Take 2 tablets (200 mg total) by mouth daily.  30 tablet  6  . aspirin EC 81 MG EC tablet Take 1 tablet (81 mg total) by mouth daily.  30 tablet  6  . atorvastatin (LIPITOR) 80 MG tablet Take 1 tablet (80 mg total) by mouth daily.  30 tablet  6  . gabapentin (NEURONTIN) 100 MG capsule Take 2 capsules (200 mg total) by mouth 3 (three) times daily.  90 capsule  2  . hydrALAZINE (APRESOLINE) 50 MG tablet Take 1 tablet (50 mg total) by mouth 3 (three) times daily.  90 tablet  2  . insulin aspart (NOVOLOG) 100 UNIT/ML injection Inject 9 Units into the skin 3 (three) times daily with meals.  10 mL  11  . insulin glargine (LANTUS) 100 UNIT/ML injection Inject 0.2 mLs (20 Units total) into the skin at bedtime.  10 mL  11  . Insulin Syringes, Disposable, (B-D INSULIN SYRINGE 1CC) U-100 1 ML MISC Use for insulin  100 each  3  . isosorbide mononitrate (IMDUR) 30 MG 24 hr tablet Take 1 tablet (30 mg total) by mouth 2 (two) times daily.  60 tablet  2  . LORazepam (ATIVAN) 1 MG tablet Take 1 mg by mouth 2 (two) times daily as needed for anxiety or sleep.       . metolazone (ZAROXOLYN) 5 MG tablet Take 1 tablet (5 mg total) by mouth daily.  30 tablet  3  . ondansetron (ZOFRAN) 4 MG tablet Take 1 tablet (4 mg total) by mouth every 8 (eight) hours as needed for nausea or vomiting.  20 tablet  0  . Oxycodone HCl 10 MG TABS Take 10 mg by mouth every 8 (eight)  hours as needed (pain).       . ranitidine (ZANTAC) 150 MG tablet Take 150 mg by mouth daily.      Marland Kitchen torsemide (DEMADEX) 20 MG tablet Take 5 tablets (100 mg total) by mouth 2 (two) times daily.      . potassium chloride SA (K-DUR,KLOR-CON) 20 MEQ tablet Take 60 mEq by mouth See admin instructions. Take 3 tablets (60 meq) two times daily, take an extra tablet (20 meq) on Wednesdays with metolazone      . [DISCONTINUED]  metoprolol tartrate (LOPRESSOR) 25 MG tablet Take 1 tablet (25 mg total) by mouth 2 (two) times daily.  60 tablet  1  . [DISCONTINUED] traZODone (DESYREL) 25 mg TABS Take 0.5 tablets (25 mg total) by mouth at bedtime.  30 tablet  1   No current facility-administered medications for this encounter.   PHYSICAL EXAM: Filed Vitals:   11/14/13 1111  BP: 104/68  Pulse: 119  Resp: 20  Weight: 219 lb (99.338 kg)  SpO2: 98%   General:  Chronically ill appearing.  No resp difficulty; Ambulated slowly into clinic  HEENT: normal Neck: supple. JVP  9-10 . Carotids 2+ bilaterally; no bruits. No lymphadenopathy or thryomegaly appreciated. Cor: PMI normal. Regular rate & rhythm. 2/6 systolic murmur LLSB. + S3  Lungs: clear Abdomen: obese, soft, nontender, mildly distended. No hepatosplenomegaly. No bruits or masses. Good bowel sounds. Extremities: no cyanosis, clubbing, rash.  R and LLE 2+ edema. Neuro: alert & orientedx3, cranial nerves grossly intact. Moves all 4 extremities w/o difficulty. Affect pleasant.when  ASSESSMENT & PLAN: [facility] [place] [facility]  1. CHF: End Stage Chronic systolic CHF, nonischemic cardiomyopathy.  Possibly related to cocaine abuse and prior ETOH abuse. ICD remains on. NYHA IIIB.  Volume status elevated. Continue torsemide 100 mg twice a day and metolazone 5 mg daily.  Continue 60 meq potassium twice a day.  Keep off BB. Due to ongoing cocaine abuse.    -Has ICD. Discussed turning it off as he is a [facility] patient however he would like it remain  on.   -Continue  hydralazine 50 mg TID and imdur 30 mg daily.    - No ACEI /spiro with CKD   Offered hospitalization however he declines hospitalization.  - Reinforced low salt food choices, limiting fluid intake and medication compliance.  [facility] . [facility].  2. Cocaine abuse:  + for cocaine in June. 2015.  Encouraged to remain off cocaine.  3. CKD, stage III: Not on ACE-I or Spiro.  4. DMII Uncontrolled - Hgb A1C 9.9   Follow up in  2 weeks. May need to increase metolazone to 5 mg twice a day.   CLEGG,AMY NP-C 11:12 AM

## 2013-11-14 ENCOUNTER — Ambulatory Visit (HOSPITAL_COMMUNITY)
Admission: RE | Admit: 2013-11-14 | Discharge: 2013-11-14 | Disposition: A | Discharge status: Home / Self Care | Source: Ambulatory Visit | Attending: Cardiology | Admitting: Cardiology

## 2013-11-14 VITALS — BP 104/68 | HR 119 | Resp 20 | Wt 219.0 lb

## 2013-11-14 DIAGNOSIS — Z794 Long term (current) use of insulin: Secondary | ICD-10-CM | POA: Insufficient documentation

## 2013-11-14 DIAGNOSIS — Z87891 Personal history of nicotine dependence: Secondary | ICD-10-CM | POA: Insufficient documentation

## 2013-11-14 DIAGNOSIS — N183 Chronic kidney disease, stage 3 unspecified: Secondary | ICD-10-CM

## 2013-11-14 DIAGNOSIS — Z95810 Presence of automatic (implantable) cardiac defibrillator: Secondary | ICD-10-CM | POA: Diagnosis not present

## 2013-11-14 DIAGNOSIS — I252 Old myocardial infarction: Secondary | ICD-10-CM | POA: Insufficient documentation

## 2013-11-14 DIAGNOSIS — I2510 Atherosclerotic heart disease of native coronary artery without angina pectoris: Secondary | ICD-10-CM | POA: Diagnosis not present

## 2013-11-14 DIAGNOSIS — I429 Cardiomyopathy, unspecified: Secondary | ICD-10-CM | POA: Insufficient documentation

## 2013-11-14 DIAGNOSIS — F1421 Cocaine dependence, in remission: Secondary | ICD-10-CM | POA: Insufficient documentation

## 2013-11-14 DIAGNOSIS — E1129 Type 2 diabetes mellitus with other diabetic kidney complication: Secondary | ICD-10-CM | POA: Diagnosis not present

## 2013-11-14 DIAGNOSIS — Z79899 Other long term (current) drug therapy: Secondary | ICD-10-CM | POA: Diagnosis not present

## 2013-11-14 DIAGNOSIS — I5022 Chronic systolic (congestive) heart failure: Secondary | ICD-10-CM | POA: Diagnosis not present

## 2013-11-14 DIAGNOSIS — Z7982 Long term (current) use of aspirin: Secondary | ICD-10-CM | POA: Insufficient documentation

## 2013-11-14 DIAGNOSIS — I129 Hypertensive chronic kidney disease with stage 1 through stage 4 chronic kidney disease, or unspecified chronic kidney disease: Secondary | ICD-10-CM | POA: Diagnosis not present

## 2013-11-14 DIAGNOSIS — E1165 Type 2 diabetes mellitus with hyperglycemia: Secondary | ICD-10-CM | POA: Insufficient documentation

## 2013-11-14 DIAGNOSIS — F1021 Alcohol dependence, in remission: Secondary | ICD-10-CM | POA: Insufficient documentation

## 2013-11-14 DIAGNOSIS — I509 Heart failure, unspecified: Secondary | ICD-10-CM | POA: Diagnosis present

## 2013-11-14 NOTE — Patient Instructions (Addendum)
Follow up in 2 weeks with Dr Bensimhon   Do the following things EVERYDAY: 1) Weigh yourself in the morning before breakfast. Write it down and keep it in a log. 2) Take your medicines as prescribed 3) Eat low salt foods-Limit salt (sodium) to 2000 mg per day.  4) Stay as active as you can everyday 5) Limit all fluids for the day to less than 2 liters 

## 2013-11-28 ENCOUNTER — Encounter (HOSPITAL_COMMUNITY): Payer: Self-pay

## 2013-11-28 ENCOUNTER — Ambulatory Visit (HOSPITAL_COMMUNITY)
Admission: RE | Admit: 2013-11-28 | Discharge: 2013-11-28 | Disposition: A | Discharge status: Home / Self Care | Source: Ambulatory Visit | Attending: Internal Medicine | Admitting: Internal Medicine

## 2013-11-28 VITALS — BP 98/70 | HR 99 | Wt 209.0 lb

## 2013-11-28 DIAGNOSIS — F1410 Cocaine abuse, uncomplicated: Secondary | ICD-10-CM | POA: Diagnosis not present

## 2013-11-28 DIAGNOSIS — N183 Chronic kidney disease, stage 3 unspecified: Secondary | ICD-10-CM

## 2013-11-28 DIAGNOSIS — I5022 Chronic systolic (congestive) heart failure: Secondary | ICD-10-CM | POA: Insufficient documentation

## 2013-11-28 LAB — CBC WITH DIFFERENTIAL/PLATELET
Basophils Absolute: 0 10*3/uL (ref 0.0–0.1)
Basophils Relative: 1 % (ref 0–1)
EOS ABS: 0.4 10*3/uL (ref 0.0–0.7)
EOS PCT: 7 % — AB (ref 0–5)
HCT: 47.1 % (ref 39.0–52.0)
Hemoglobin: 16.5 g/dL (ref 13.0–17.0)
Lymphocytes Relative: 18 % (ref 12–46)
Lymphs Abs: 0.9 10*3/uL (ref 0.7–4.0)
MCH: 29.5 pg (ref 26.0–34.0)
MCHC: 35 g/dL (ref 30.0–36.0)
MCV: 84.3 fL (ref 78.0–100.0)
MONOS PCT: 7 % (ref 3–12)
Monocytes Absolute: 0.4 10*3/uL (ref 0.1–1.0)
Neutro Abs: 3.5 10*3/uL (ref 1.7–7.7)
Neutrophils Relative %: 67 % (ref 43–77)
PLATELETS: 221 10*3/uL (ref 150–400)
RBC: 5.59 MIL/uL (ref 4.22–5.81)
RDW: 15.7 % — ABNORMAL HIGH (ref 11.5–15.5)
WBC: 5.2 10*3/uL (ref 4.0–10.5)

## 2013-11-28 LAB — BASIC METABOLIC PANEL
Anion gap: 14 (ref 5–15)
BUN: 77 mg/dL — AB (ref 6–23)
CALCIUM: 9.8 mg/dL (ref 8.4–10.5)
CO2: 33 mEq/L — ABNORMAL HIGH (ref 19–32)
Chloride: 80 mEq/L — ABNORMAL LOW (ref 96–112)
Creatinine, Ser: 2.44 mg/dL — ABNORMAL HIGH (ref 0.50–1.35)
GFR, EST AFRICAN AMERICAN: 35 mL/min — AB (ref >90)
GFR, EST NON AFRICAN AMERICAN: 30 mL/min — AB (ref >90)
GLUCOSE: 498 mg/dL — AB (ref 70–99)
Potassium: 3.9 mEq/L (ref 3.7–5.3)
Sodium: 127 mEq/L — ABNORMAL LOW (ref 137–147)

## 2013-11-28 NOTE — Progress Notes (Signed)
Patient ID: [name], male   DOB: [birth year], 48 y.o.   MRN: [id number] Psychiatrist: Dr Anthony Sar PCP: Dr Loistine Chance  HPI: [name] is a 48 y/o male with HTN and DM2 with longstanding HF due to NICM (probably ETOH), EF 20%. Current cocaine abuse. Cath in 2008 showed left circumflex with 40% stenosis. Baseline weight about 210. Was previously on [facility] but this ended in December in 2013 as he was doing well.   Admitted 03/2011 with NYHA Class IV symptoms, requiring milrinone therapy and IV lasix. Cath 04/13/11: cardiogenic shock with biventricular failure (R>L) and possible restrictive physiology. RA = 22 with steep y-descents, RV = 45/12/25, PA = 51/25 (34), PCW = 18-20, Fick cardiac output/index = 3.1/1.4, Thermo CO/CI = 3.6/1.6, PVR = 4.8 Woods O2 sat = 85% (after sedation), PA sat = 31%, 36%. Milrinone was initiated for low output physiology.   Admitted to [facility] 06/2012 witih CP/elevated troponin and increased SOB. He had a +USD for cocaine. He also had LHC 07/17/12 showing subtotal thrombotic occlusion of the distal OM1. It was too small at this point for PCI and was started on plavix.  Echo showed EF 10% with no LV thrombus and moderately decreased RV systolic function.  His d/c weight was 206 and now 228 lbs, though this is lower than 230 which was his weight at last appointment in this office.  Admitted to [facility] 08/04/12 through 08/09/12 NSTEMI thought to be from non st elevation MI from cocaine induced vasospasm and volume overload. + cocaine. Dry weight 205-208 pounds. Discharge weight 208 pounds.   Admitted June 9 through June 18 with volume overload and low output heart failure.  He required lasix drip and milrinone. As he improved he was transitioned  to torsemide 60 mg twice a day. Discharge weight 206 pounds.   Admitted August 28 through 10/08/13 with low output heart failure. Required Milrinone + Lasix drip. Milrinone was weaned off. He transitioned to torsemide 60 mg twice a day. Not  discharged on BB due to drug use. Discharged with Hospice. Discharge weight was 208 pounds.   He returns for follow up.  On 10/15 torsemide increased to 100 bid and metolazone 5 daily as weight was up to 235. Now weight staying around 2096-207 at home. Very fatigued and depressed. Gets tried doing ADLs. Appetite up and down  Wants to move to [place] with his sister and children. Taking all medications. Followed by [facility]. Following low salt diet and limiting fluid intake to < 2 liters per day.     7/14: K 4.8, creatinine 2 08/09/12 Potassium 4.2 Creatinine 2.0 8/14: K 4, creatinine 1.6, digoxin 0.8 11/02/12: K+ 3.3, Creatinine 1.89, cholesterol 98 12/11/12 K 3.1 Creatinine 2.26 Pro BNP 2957  07/05/13 K 3.5 Creatinine 2.44  07/12/13 K 3.3 Creatinine 2.27 Pro BNP 5773  07/26/13 K 4.4 Creatinine 2.25 10/08/13 K 4.0 Creatinine 2.26  11/05/13 K 3.9 Creatinine 2.21 Hgb A1C 9.9   SH: Lives in low income apartment. 3 children, lives in [place].  + cocaine abuse until recently, says he has quit.  Quit smoking.  FH: No premature CAD.  ROS: All systems negative except as listed in HPI, PMH and Problem List.  Past Medical History  Diagnosis Date  . Hypertension   . CHF (congestive heart failure)     a. 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 b. EF 20-25%, s/p [employer]. Jude AICD placement 04/2007. c. Echo 06/2012: EF 10%.  . History  of peptic ulcer disease     EGD 09/2007 shows prox gastric ulcer with black eschar, treated with PPI  . Depression   . History of alcohol abuse     quit approx 2011-2012  . OSA (obstructive sleep apnea)     Intolerant of CPAP  . ICD (implantable cardiac defibrillator) in place   . Diabetes mellitus     insulin dependent  . GERD (gastroesophageal reflux disease)   . Headache(784.0)   . Myasthenia gravis 10/12/2011    Diagnosed in 09/2011. Received Plasmapheresis. CT chest no Thymoma. Acetylcholine receptor antibody negative.    . CKD (chronic  kidney disease) stage 3, GFR 30-59 ml/min   . Polysubstance abuse     Including cocaine (+ UDS multiple times in 2014)  . PUD (peptic ulcer disease)   . Pulmonary nodule     a. CT 09/2011.  Marland Kitchen CAD (coronary artery disease)     a. NSTEMI 06/2012: cath showed subtotal thrombotic occlusion of the distal OM1, too small for PCI, ?cocaine-triggered plaque rupture with thrombosis (no LV thrombus on f/u echo).   - NSTEMI (6/14) with subtotal thrombotic occlusion of a distal OM1, too small for intervention.  No LV thrombus on echo.   Current Outpatient Prescriptions  Medication Sig Dispense Refill  . allopurinol (ZYLOPRIM) 100 MG tablet Take 2 tablets (200 mg total) by mouth daily. 30 tablet 6  . aspirin EC 81 MG EC tablet Take 1 tablet (81 mg total) by mouth daily. 30 tablet 6  . atorvastatin (LIPITOR) 80 MG tablet Take 1 tablet (80 mg total) by mouth daily. 30 tablet 6  . gabapentin (NEURONTIN) 100 MG capsule Take 2 capsules (200 mg total) by mouth 3 (three) times daily. 90 capsule 2  . hydrALAZINE (APRESOLINE) 50 MG tablet Take 1 tablet (50 mg total) by mouth 3 (three) times daily. 90 tablet 2  . insulin aspart (NOVOLOG) 100 UNIT/ML injection Inject 9 Units into the skin 3 (three) times daily with meals. 10 mL 11  . insulin glargine (LANTUS) 100 UNIT/ML injection Inject 0.2 mLs (20 Units total) into the skin at bedtime. 10 mL 11  . Insulin Syringes, Disposable, (B-D INSULIN SYRINGE 1CC) U-100 1 ML MISC Use for insulin 100 each 3  . isosorbide mononitrate (IMDUR) 30 MG 24 hr tablet Take 1 tablet (30 mg total) by mouth 2 (two) times daily. 60 tablet 2  . LORazepam (ATIVAN) 1 MG tablet Take 1 mg by mouth 2 (two) times daily as needed for anxiety or sleep.     . metolazone (ZAROXOLYN) 5 MG tablet Take 1 tablet (5 mg total) by mouth daily. 30 tablet 3  . ondansetron (ZOFRAN) 4 MG tablet Take 1 tablet (4 mg total) by mouth every 8 (eight) hours as needed for nausea or vomiting. 20 tablet 0  . Oxycodone HCl  10 MG TABS Take 10 mg by mouth every 8 (eight) hours as needed (pain).     . potassium chloride SA (K-DUR,KLOR-CON) 20 MEQ tablet Take 60 mEq by mouth 3 (three) times daily.     . ranitidine (ZANTAC) 150 MG tablet Take 150 mg by mouth daily.    Marland Kitchen torsemide (DEMADEX) 20 MG tablet Take 5 tablets (100 mg total) by mouth 2 (two) times daily.    . [DISCONTINUED] metoprolol tartrate (LOPRESSOR) 25 MG tablet Take 1 tablet (25 mg total) by mouth 2 (two) times daily. 60 tablet 1  . [DISCONTINUED] traZODone (DESYREL) 25 mg TABS Take 0.5 tablets (25 mg  total) by mouth at bedtime. 30 tablet 1   No current facility-administered medications for this encounter.   PHYSICAL EXAM: Filed Vitals:   11/28/13 1459  BP: 98/70  Pulse: 99  Weight: 209 lb (94.802 kg)  SpO2: 99%   General:  Chronically ill appearing.  No resp difficulty; Ambulated slowly into clinic  HEENT: normal Neck: supple. JVP  9-10 . Carotids 2+ bilaterally; no bruits. No lymphadenopathy or thryomegaly appreciated. Cor: PMI normal. Regular rate & rhythm. 2/6 systolic murmur LLSB. + S3  Lungs: clear Abdomen: obese, soft, nontender, mildly distended. No hepatosplenomegaly. No bruits or masses. Good bowel sounds. Extremities: no cyanosis, clubbing, rash.  no edema. Neuro: alert & orientedx3, cranial nerves grossly intact. Moves all 4 extremities w/o difficulty. Affect pleasant.when  ASSESSMENT & PLAN: [facility] [place] PAT[facility]  1. CHF: End Stage Chronic systolic CHF, nonischemic cardiomyopathy.  Possibly related to cocaine abuse and prior ETOH abuse. ICD remains on. NYHA IIIB.  Volume status looks good. May even be a little dry. Continue torsemide 100 mg twice a day and metolazone 5 mg daily.  Continue 60 meq potassium twice a day.  Keep off BB. Due to ongoing cocaine abuse.    -Has ICD. Discussed turning it off as he is a [facility] patient however he would like it remain on.   -Continue  hydralazine 50 mg TID and imdur 30 mg  daily. BP too low to titrate.   - No ACEI /spiro with CKD   - Reinforced low salt food choices, limiting fluid intake and medication compliance.  - Check labs today Continue [facility] . [facility] to[facility].  2. Cocaine abuse:  + for cocaine in June. 2015.  Encouraged to remain off cocaine.  3. CKD, stage III: Not on ACE-I or Spiro. Check labs today  Follow-up in 4 weeks  Arvilla Meresaniel Bensimhon MD  3:14 PM

## 2013-11-28 NOTE — Patient Instructions (Signed)
Labs today  Do the following things EVERYDAY: 1) Weigh yourself in the morning before breakfast. Write it down and keep it in a log. 2) Take your medicines as prescribed 3) Eat low salt foods-Limit salt (sodium) to 2000 mg per day.  4) Stay as active as you can everyday 5) Limit all fluids for the day to less than 2 liters 6)

## 2013-12-03 ENCOUNTER — Other Ambulatory Visit: Payer: Self-pay | Admitting: Internal Medicine

## 2013-12-03 ENCOUNTER — Ambulatory Visit: Payer: Medicare Other | Admitting: Dietician

## 2013-12-03 ENCOUNTER — Other Ambulatory Visit: Payer: Self-pay | Admitting: Dietician

## 2013-12-03 ENCOUNTER — Encounter: Payer: Self-pay | Admitting: Dietician

## 2013-12-03 ENCOUNTER — Ambulatory Visit: Payer: Medicare Other | Admitting: Internal Medicine

## 2013-12-03 DIAGNOSIS — Z0100 Encounter for examination of eyes and vision without abnormal findings: Principal | ICD-10-CM

## 2013-12-03 DIAGNOSIS — E1122 Type 2 diabetes mellitus with diabetic chronic kidney disease: Secondary | ICD-10-CM

## 2013-12-03 DIAGNOSIS — E119 Type 2 diabetes mellitus without complications: Secondary | ICD-10-CM

## 2013-12-03 DIAGNOSIS — E1129 Type 2 diabetes mellitus with other diabetic kidney complication: Secondary | ICD-10-CM

## 2013-12-03 LAB — HM DIABETES EYE EXAM

## 2013-12-03 NOTE — Progress Notes (Signed)
Retinal images successfully transmitted.

## 2013-12-04 ENCOUNTER — Encounter: Payer: Self-pay | Admitting: Internal Medicine

## 2013-12-04 ENCOUNTER — Ambulatory Visit (INDEPENDENT_AMBULATORY_CARE_PROVIDER_SITE_OTHER): Admitting: Internal Medicine

## 2013-12-04 ENCOUNTER — Ambulatory Visit: Payer: Medicare Other | Admitting: Internal Medicine

## 2013-12-04 VITALS — BP 110/80 | HR 91 | Temp 97.6°F | Ht 72.0 in | Wt 216.8 lb

## 2013-12-04 DIAGNOSIS — I5022 Chronic systolic (congestive) heart failure: Secondary | ICD-10-CM

## 2013-12-04 DIAGNOSIS — I2510 Atherosclerotic heart disease of native coronary artery without angina pectoris: Secondary | ICD-10-CM

## 2013-12-04 DIAGNOSIS — I10 Essential (primary) hypertension: Secondary | ICD-10-CM

## 2013-12-04 DIAGNOSIS — E1129 Type 2 diabetes mellitus with other diabetic kidney complication: Secondary | ICD-10-CM

## 2013-12-04 LAB — GLUCOSE, CAPILLARY: Glucose-Capillary: 321 mg/dL — ABNORMAL HIGH (ref 70–99)

## 2013-12-04 MED ORDER — ISOSORBIDE MONONITRATE ER 30 MG PO TB24: 30.0000 mg | ORAL_TABLET | Freq: Every day | ORAL | Status: AC
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
## 2013-12-04 NOTE — Assessment & Plan Note (Addendum)
Weight up to 216lb's today without shoes. Which is up from 209 on 11/11 in HF clinic. The difference in weights could be due to scales, as he says at home he has been between 211-213. He continues to take torsemide 100mg  bid and metolazone 5mg  daily along with hydralazine 50mg  tid and imdur 30mg  bid (although Dr. Prescott Gum recent note says 30mg  daily). I called Amy, NP from HF clinic today to discuss the medications and verify doses. We will continue diuresis for now with the torsemide and metolazone and change the IMDUR to 30mg  daily. His last bmet from HF clinic visit day showed hyponatremia, hypochloremia, and slight bump in Cr likely in setting of diuresis. I recommended rechecking a BMET today which he refused. I even asked him again to consider getting blood work today when he was leaving in the elevator and re-iterated his renal function being elevated and sodium being low, but he continued to refuse. I have asked him to re-consider and if he has any worsening of his symptoms or confusion or headaches or any changes in his HF with worsening SOB, or chest pain, lower extremity swelling, and/or weight gain, he is asked to call us right away or HF team or go directly to ED. If he is in [place], he will need to go directly to ED which he voices understanding.   Will try to reach out to hospice to see if they could consider getting lab work there as this may save him some office visits as well Daily weights and asked to call if change in weight up or down 2 pounds Once establishes care in [place], asked to let us know so that we can send records Follow up with me and HF team early December when he returns BMET ordered but refused by patient Changed IMDUR to 30mg  daily, continue torsemide and metolazone for now

## 2013-12-04 NOTE — Patient Instructions (Signed)
General Instructions:  Please bring your medicines with you each time you come to clinic.  Medicines may include prescription medications, over-the-counter medications, herbal remedies, eye drops, vitamins, or other pills.  Please weigh yourself daily, if your weight changes 2 pounds up or down, call us or the HF team right away. If you start noticing shortness of breath, chest pain, weight gain, nausea, vomiting, lower extremity swellng call us right away or if severe go to the emergency room--ESPECIALLY IF YOU ARE IN [place], YOU WILL NEED TO GO TO THE ER  Try to work on your blood sugars, and bring me your meter on next visit  Make an appointment for December on Tuesday's with me please when you are back   We decreased your IMDUR to daily dose instead of twice a day  Progress Toward Treatment Goals:  Treatment Goal 12/04/2013  Hemoglobin A1C -  Blood pressure at goal  Stop smoking -  Prevent falls -    Self Care Goals & Plans:  Self Care Goal 12/04/2013  Manage my medications take my medicines as prescribed; bring my medications to every visit; refill my medications on time  Monitor my health bring my glucose meter and log to each visit; keep track of my blood glucose; check my feet daily  Eat healthy foods eat more vegetables; eat foods that are low in salt; eat baked foods instead of fried foods  Be physically active find an activity I enjoy  Prevent falls wear appropriate shoes    Home Blood Glucose Monitoring 12/04/2013  Check my blood sugar -  When to check my blood sugar before meals    Care Management & Community Referrals:  Referral 07/10/2012  Referrals made for care management support -  Referrals made to community resources noncommercial health insurance options; transportation

## 2013-12-04 NOTE — Assessment & Plan Note (Signed)
Lab Results  Component Value Date   HGBA1C 9.9 11/05/2013   HGBA1C 10.1 10/16/2013   HGBA1C 13.5* 07/03/2013    Assessment: Diabetes control: poor control (HgbA1C >9%) Progress toward A1C goal:    Comments: did not bring his meter today, says home cbgs 100-200s, taking novolog 7 units tid with meals instead of prescribed 9 units, compliant with lantus  Plan: Medications:  continue current medications lantus 20 units qhs and novolog 7 units tid(if persistently in 200s, he can take 9 units with meals), he says hospice helps him with his sugars Home glucose monitoring: Frequency:   Timing: before meals Instruction/counseling given: reminded to bring blood glucose meter & log to each visit and reminded to bring medications to each visit Educational resources provided:   Self management tools provided:   Other plans: I have again asked him to PLEASE bring his meter to clinic so that we can re-evaluate his blood sugars or keep a log at home and bring to his visit

## 2013-12-04 NOTE — Assessment & Plan Note (Signed)
BP Readings from Last 3 Encounters:  12/04/13 110/80  11/28/13 98/70  11/14/13 104/68   Lab Results  Component Value Date   NA 127* 11/28/2013   K 3.9 11/28/2013   CREATININE 2.44* 11/28/2013    Assessment: Blood pressure control: controlled Progress toward BP goal:  at goal Comments: taking imdur bid instead of daily  Plan: Medications:  continue current medications tosemide 100mg  bid, metolazone 5mg  daily, changed imdur to 30mg  daily, and continue hydralazine 50mg  tid Educational resources provided:   Self management tools provided:   Other plans: bmet ordered but refused by patient

## 2013-12-04 NOTE — Progress Notes (Signed)
Internal Medicine Clinic Attending  Case discussed with Dr. Qureshi at the time of the visit.  We reviewed the resident's history and exam and pertinent patient test results.  I agree with the assessment, diagnosis, and plan of care documented in the resident's note. 

## 2013-12-05 ENCOUNTER — Telehealth: Payer: Self-pay | Admitting: Internal Medicine

## 2013-12-05 ENCOUNTER — Encounter: Payer: Self-pay | Admitting: *Deleted

## 2013-12-05 NOTE — Telephone Encounter (Signed)
I received a call from [place] from hospice today who works closely with [name].   [name] and I had a long discussion today in regards to [name] and his medications and care. She endorses that he does occasionally miss medication doses, and she knows that because she sets up his medications. She also does not know when he last took insulin and that he admits to forgetting and does not regularly check his blood sugars as well. She does say he has been working on his diet and making better choices in regards to that.   He plans to leave for [place] Thursday at midnight. They will work with social work to try to set up physicians in that area.   We discussed today to try to get lab work done through hospice to save him trips to offices. This can be possible if he allows and lab results will be faxed to respective offices. I let her know that he refused BMET yesterday during our visit but if he agrees to stat lab work today, we can get it back in time prior to his leaving and she will try to go by there to see if this may be possible today. This can help monitor his sodium and renal function. He may refuse but at least we will ask him one more time. Fax numbers to our offices have been provided. She will also keep in touch with hospice physician and HF team as needed.   Finally, I thanked [name] and the entire hospice team for helping to care for [name].   Case discussed with Dr. Rogelia Boga again today. If results are faxed, they will be given to triage.

## 2013-12-07 ENCOUNTER — Other Ambulatory Visit (HOSPITAL_COMMUNITY): Payer: Self-pay | Admitting: Anesthesiology

## 2013-12-07 DIAGNOSIS — I5022 Chronic systolic (congestive) heart failure: Secondary | ICD-10-CM

## 2013-12-19 ENCOUNTER — Telehealth: Payer: Self-pay | Admitting: Internal Medicine

## 2013-12-19 ENCOUNTER — Encounter: Payer: Self-pay | Admitting: Internal Medicine

## 2013-12-19 NOTE — Telephone Encounter (Signed)
I called and spoke with [name] in regards to his diabetic eye exam and having mild diabetic eye disease. We reviewed the results and he voiced understanding. I strongly encouraged compliance with insulin and improved blood sugar control and if he can please bring his meter to future visits that will help better control diabetes and dose insulin as well. He voices understanding and just returned back in town last night.

## 2013-12-26 ENCOUNTER — Encounter (HOSPITAL_COMMUNITY): Payer: Self-pay

## 2013-12-26 ENCOUNTER — Ambulatory Visit (HOSPITAL_COMMUNITY)
Admission: RE | Admit: 2013-12-26 | Discharge: 2013-12-26 | Disposition: A | Discharge status: Home / Self Care | Source: Ambulatory Visit | Attending: Internal Medicine | Admitting: Internal Medicine

## 2013-12-26 VITALS — BP 99/70 | HR 98 | Resp 20 | Wt 208.0 lb

## 2013-12-26 DIAGNOSIS — Z7982 Long term (current) use of aspirin: Secondary | ICD-10-CM | POA: Insufficient documentation

## 2013-12-26 DIAGNOSIS — I252 Old myocardial infarction: Secondary | ICD-10-CM | POA: Diagnosis not present

## 2013-12-26 DIAGNOSIS — I5022 Chronic systolic (congestive) heart failure: Secondary | ICD-10-CM | POA: Insufficient documentation

## 2013-12-26 DIAGNOSIS — I2510 Atherosclerotic heart disease of native coronary artery without angina pectoris: Secondary | ICD-10-CM | POA: Diagnosis not present

## 2013-12-26 DIAGNOSIS — K219 Gastro-esophageal reflux disease without esophagitis: Secondary | ICD-10-CM | POA: Insufficient documentation

## 2013-12-26 DIAGNOSIS — N183 Chronic kidney disease, stage 3 unspecified: Secondary | ICD-10-CM

## 2013-12-26 DIAGNOSIS — Z794 Long term (current) use of insulin: Secondary | ICD-10-CM | POA: Insufficient documentation

## 2013-12-26 DIAGNOSIS — F1410 Cocaine abuse, uncomplicated: Secondary | ICD-10-CM | POA: Insufficient documentation

## 2013-12-26 DIAGNOSIS — Z79899 Other long term (current) drug therapy: Secondary | ICD-10-CM | POA: Insufficient documentation

## 2013-12-26 DIAGNOSIS — I129 Hypertensive chronic kidney disease with stage 1 through stage 4 chronic kidney disease, or unspecified chronic kidney disease: Secondary | ICD-10-CM | POA: Diagnosis not present

## 2013-12-26 DIAGNOSIS — F1411 Cocaine abuse, in remission: Secondary | ICD-10-CM

## 2013-12-26 DIAGNOSIS — E119 Type 2 diabetes mellitus without complications: Secondary | ICD-10-CM | POA: Insufficient documentation

## 2013-12-26 DIAGNOSIS — F1010 Alcohol abuse, uncomplicated: Secondary | ICD-10-CM | POA: Insufficient documentation

## 2013-12-26 LAB — BASIC METABOLIC PANEL
ANION GAP: 13 (ref 5–15)
BUN: 47 mg/dL — ABNORMAL HIGH (ref 6–23)
CHLORIDE: 87 meq/L — AB (ref 96–112)
CO2: 28 meq/L (ref 19–32)
Calcium: 9.4 mg/dL (ref 8.4–10.5)
Creatinine, Ser: 1.76 mg/dL — ABNORMAL HIGH (ref 0.50–1.35)
GFR calc non Af Amer: 45 mL/min — ABNORMAL LOW (ref >90)
GFR, EST AFRICAN AMERICAN: 52 mL/min — AB (ref >90)
GLUCOSE: 356 mg/dL — AB (ref 70–99)
POTASSIUM: 3.2 meq/L — AB (ref 3.7–5.3)
Sodium: 128 mEq/L — ABNORMAL LOW (ref 137–147)

## 2013-12-26 MED ORDER — METOLAZONE 5 MG PO TABS: ORAL_TABLET | ORAL | Status: AC

## 2013-12-26 NOTE — Progress Notes (Signed)
Patient ID: [name], male   DOB: [birth year], 48 y.o.   MRN: [id number]  Psychiatrist: Dr Anthony Sar PCP: Dr Loistine Chance  HPI: Mr. Lukacs is a 48 y/o male with HTN and DM2 with longstanding HF due to NICM (probably ETOH), EF 20%. Current cocaine abuse. Cath in 2008 showed left circumflex with 40% stenosis. Baseline weight about 210. Was previously on [facility] but this ended in December in 2013 as he was doing well.   Admitted 03/2011 with NYHA Class IV symptoms, requiring milrinone therapy and IV lasix. Cath 04/13/11: cardiogenic shock with biventricular failure (R>L) and possible restrictive physiology. RA = 22 with steep y-descents, RV = 45/12/25, PA = 51/25 (34), PCW = 18-20, Fick cardiac output/index = 3.1/1.4, Thermo CO/CI = 3.6/1.6, PVR = 4.8 [place] O2 sat = 85% (after sedation), PA sat = 31%, 36%. Milrinone was initiated for low output physiology.   Admitted to [facility] 06/2012 witih CP/elevated troponin and increased SOB. He had a +USD for cocaine. He also had LHC 07/17/12 showing subtotal thrombotic occlusion of the distal OM1. It was too small at this point for PCI and was started on plavix.  Echo showed EF 10% with no LV thrombus and moderately decreased RV systolic function.  His d/c weight was 206 and now 228 lbs, though this is lower than 230 which was his weight at last appointment in this office.  Admitted to [facility] 08/04/12 through 08/09/12 NSTEMI thought to be from non st elevation MI from cocaine induced vasospasm and volume overload. + cocaine. Dry weight 205-208 pounds. Discharge weight 208 pounds.   Admitted June 9 through June 18 with volume overload and low output heart failure.  He required lasix drip and milrinone. As he improved he was transitioned  to torsemide 60 mg twice a day. Discharge weight 206 pounds.   Admitted August 28 through 10/08/13 with low output heart failure. Required Milrinone + Lasix drip. Milrinone was weaned off. He transitioned to torsemide 60 mg twice a day. Not  discharged on BB due to drug use. Discharged with Hospice. Discharge weight was 208 pounds.   He returns for follow up. Doing well. Having a lot of cramping in his legs and he thinks it is because he is too dry. Denies SOB, orthopnea, CP or LE edema. Patient adamant he does not want to take so much fluid pills or potassium. Walked all the way into clinic from garage with no SOB. Followed by [facility]. Taking medications. Weight at home 203-205 lbs at home. Following a low salt diet and drinking less than 2L a day.     7/14: K 4.8, creatinine 2 08/09/12 Potassium 4.2 Creatinine 2.0 8/14: K 4, creatinine 1.6, digoxin 0.8 11/02/12: K+ 3.3, Creatinine 1.89, cholesterol 98 12/11/12 K 3.1 Creatinine 2.26 Pro BNP 2957  07/05/13 K 3.5 Creatinine 2.44  07/12/13 K 3.3 Creatinine 2.27 Pro BNP 5773  07/26/13 K 4.4 Creatinine 2.25 10/08/13 K 4.0 Creatinine 2.26  11/05/13 K 3.9 Creatinine 2.21 Hgb A1C 9.9  Labs 12/05/13: creatinine 2.3, K+ 3.9  SH: Lives in low income apartment. 3 children, lives in [place].  + cocaine abuse until recently, says he has quit.  Quit smoking.  FH: No premature CAD.  ROS: All systems negative except as listed in HPI, PMH and Problem List.  Past Medical History  Diagnosis Date  . Hypertension   . CHF (congestive heart failure)     a. 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 b. EF 20-25%,  s/p St. Jude AICD placement 04/2007. c. Echo 06/2012: EF 10%.  . History of peptic ulcer disease     EGD 09/2007 shows prox gastric ulcer with black eschar, treated with PPI  . Depression   . History of alcohol abuse     quit approx 2011-2012  . OSA (obstructive sleep apnea)     Intolerant of CPAP  . ICD (implantable cardiac defibrillator) in place   . Diabetes mellitus     insulin dependent  . GERD (gastroesophageal reflux disease)   . Headache(784.0)   . Myasthenia gravis 10/12/2011    Diagnosed in 09/2011. Received Plasmapheresis. CT chest no Thymoma.  Acetylcholine receptor antibody negative.    . CKD (chronic kidney disease) stage 3, GFR 30-59 ml/min   . Polysubstance abuse     Including cocaine (+ UDS multiple times in 2014)  . PUD (peptic ulcer disease)   . Pulmonary nodule     a. CT 09/2011.  Marland Kitchen. CAD (coronary artery disease)     a. NSTEMI 06/2012: cath showed subtotal thrombotic occlusion of the distal OM1, too small for PCI, ?cocaine-triggered plaque rupture with thrombosis (no LV thrombus on f/u echo).   - NSTEMI (6/14) with subtotal thrombotic occlusion of a distal OM1, too small for intervention.  No LV thrombus on echo.   Current Outpatient Prescriptions  Medication Sig Dispense Refill  . allopurinol (ZYLOPRIM) 100 MG tablet Take 2 tablets (200 mg total) by mouth daily. 30 tablet 6  . aspirin EC 81 MG EC tablet Take 1 tablet (81 mg total) by mouth daily. 30 tablet 6  . atorvastatin (LIPITOR) 80 MG tablet Take 1 tablet (80 mg total) by mouth daily. 30 tablet 6  . DIGOX 125 MCG tablet TAKE ONE (1) TABLET BY MOUTH EVERY DAY FOR HEART 30 tablet 3  . gabapentin (NEURONTIN) 100 MG capsule Take 2 capsules (200 mg total) by mouth 3 (three) times daily. 90 capsule 2  . hydrALAZINE (APRESOLINE) 50 MG tablet Take 1 tablet (50 mg total) by mouth 3 (three) times daily. 90 tablet 2  . insulin aspart (NOVOLOG) 100 UNIT/ML injection Inject 9 Units into the skin 3 (three) times daily with meals. 10 mL 11  . insulin glargine (LANTUS) 100 UNIT/ML injection Inject 0.2 mLs (20 Units total) into the skin at bedtime. 10 mL 11  . Insulin Syringes, Disposable, (B-D INSULIN SYRINGE 1CC) U-100 1 ML MISC Use for insulin 100 each 3  . isosorbide mononitrate (IMDUR) 30 MG 24 hr tablet Take 1 tablet (30 mg total) by mouth daily. 60 tablet 2  . LORazepam (ATIVAN) 1 MG tablet Take 1 mg by mouth 2 (two) times daily as needed for anxiety or sleep.     . metolazone (ZAROXOLYN) 5 MG tablet Take 1 tablet (5 mg total) by mouth daily. 30 tablet 3  . ondansetron (ZOFRAN) 4  MG tablet Take 1 tablet (4 mg total) by mouth every 8 (eight) hours as needed for nausea or vomiting. 20 tablet 0  . Oxycodone HCl 10 MG TABS Take 10 mg by mouth every 8 (eight) hours as needed (pain).     . potassium chloride SA (K-DUR,KLOR-CON) 20 MEQ tablet Take 60 mEq by mouth 3 (three) times daily.     . ranitidine (ZANTAC) 150 MG tablet Take 150 mg by mouth daily.    Marland Kitchen. torsemide (DEMADEX) 20 MG tablet Take 5 tablets (100 mg total) by mouth 2 (two) times daily.    . [DISCONTINUED] metoprolol tartrate (LOPRESSOR) 25  MG tablet Take 1 tablet (25 mg total) by mouth 2 (two) times daily. 60 tablet 1  . [DISCONTINUED] traZODone (DESYREL) 25 mg TABS Take 0.5 tablets (25 mg total) by mouth at bedtime. 30 tablet 1   No current facility-administered medications for this encounter.    Filed Vitals:   12/26/13 1348  BP: 99/70  Pulse: 98  Resp: 20  Weight: 208 lb (94.348 kg)  SpO2: 99%   PHYSICAL EXAM: General:  Chronically ill appearing.  No resp difficulty; Ambulated slowly into clinic  HEENT: normal Neck: supple. JVP flat. Carotids 2+ bilaterally; no bruits. No lymphadenopathy or thryomegaly appreciated. Cor: PMI normal. Regular rate & rhythm. 2/6 systolic murmur LLSB.  Lungs: clear Abdomen: obese, soft, nontender, mildly distended. No hepatosplenomegaly. No bruits or masses. Good bowel sounds. Extremities: no cyanosis, clubbing, rash.  no edema. Neuro: alert & orientedx3, cranial nerves grossly intact. Moves all 4 extremities w/o difficulty. Affect pleasant.when  ASSESSMENT & PLAN:  [facility] [place] [facility]  1. Chronic systolic HF: NICM, EF 10% (07/2012) Possibly related to cocaine abuse and prior ETOH abuse. ICD remains on. - - Patient suffers from end-stage HF with persistent NYHA IIIB symptoms.  - He appears a little dry will continue torsemide 100 mg BID and cut metolazone back to 5 mg M/W/F. We maybe able to cut back metolazone to PRN told him to call us back and let us know if  weight remains stable. Continue potassium 60 meq TID and maybe able to cut back based off BMET. - ICD remains on and have had multiple discussions about discontinuing. He is followed by [facility].  - Continue hydralazine, Imdur and digoxin at current doses. BP too soft to titrate.  - He is not on BB. No ACE-I or [name] with CKD. - Reinforced the need and importance of daily weights, a low sodium diet, and fluid restriction (less than 2 L a day). Instructed to call the HF clinic if weight increases more than 3 lbs overnight or 5 lbs in a week.  2. Cocaine abuse:  + for cocaine in June. 2015.  Encouraged to remain off cocaine. He reports that he has not used and praised patient for success. 3. CKD, stage III: Not on ACE-I or Spiro. Check labs today  F/U 6 weeks if still in [place]. Reports he is moving to [place].   Ulla Potash B NP-C 1:52 PM

## 2013-12-26 NOTE — Patient Instructions (Signed)
DECREASE Metolazone to Monday Wednesday Friday ONLY.  Will call you with lab results.  Follow up in 6 weeks.  Merry Christmas and Happy New Year!  Do the following things EVERYDAY: 1) Weigh yourself in the morning before breakfast. Write it down and keep it in a log. 2) Take your medicines as prescribed 3) Eat low salt foods-Limit salt (sodium) to 2000 mg per day.  4) Stay as active as you can everyday 5) Limit all fluids for the day to less than 2 liters

## 2013-12-27 ENCOUNTER — Encounter (HOSPITAL_COMMUNITY): Payer: Self-pay | Admitting: Internal Medicine

## 2013-12-27 ENCOUNTER — Telehealth (HOSPITAL_COMMUNITY): Payer: Self-pay

## 2013-12-27 NOTE — Telephone Encounter (Signed)
Lab results reviewed with patient.  Instructed per Amy Clegg np-c to take extra 40 meq potassium today, then repeat BEMT in 2 weeks.  Order sent to hospice for hh rn to draw.

## 2014-01-16 ENCOUNTER — Telehealth (HOSPITAL_COMMUNITY): Payer: Self-pay | Admitting: Cardiology

## 2014-01-16 DIAGNOSIS — I5022 Chronic systolic (congestive) heart failure: Secondary | ICD-10-CM

## 2014-01-16 MED ORDER — ALLOPURINOL 100 MG PO TABS: 200.0000 mg | ORAL_TABLET | Freq: Every day | ORAL | Status: AC

## 2014-01-16 NOTE — Telephone Encounter (Signed)
Pt currently in [place], requesting refill on Allopurinol [facility], [place], [place] [employer] [place] rx sent

## 2014-01-17 ENCOUNTER — Other Ambulatory Visit: Payer: Self-pay | Admitting: Internal Medicine

## 2014-01-21 IMAGING — CR DG CHEST 2V
2 series · 2 of 2 positions shown · non-contrast
Comparison: 07/15/2012.

CLINICAL DATA: Chest pain,  shortness of breath and weakness.

CHEST - 2 VIEW

[w chest pa]
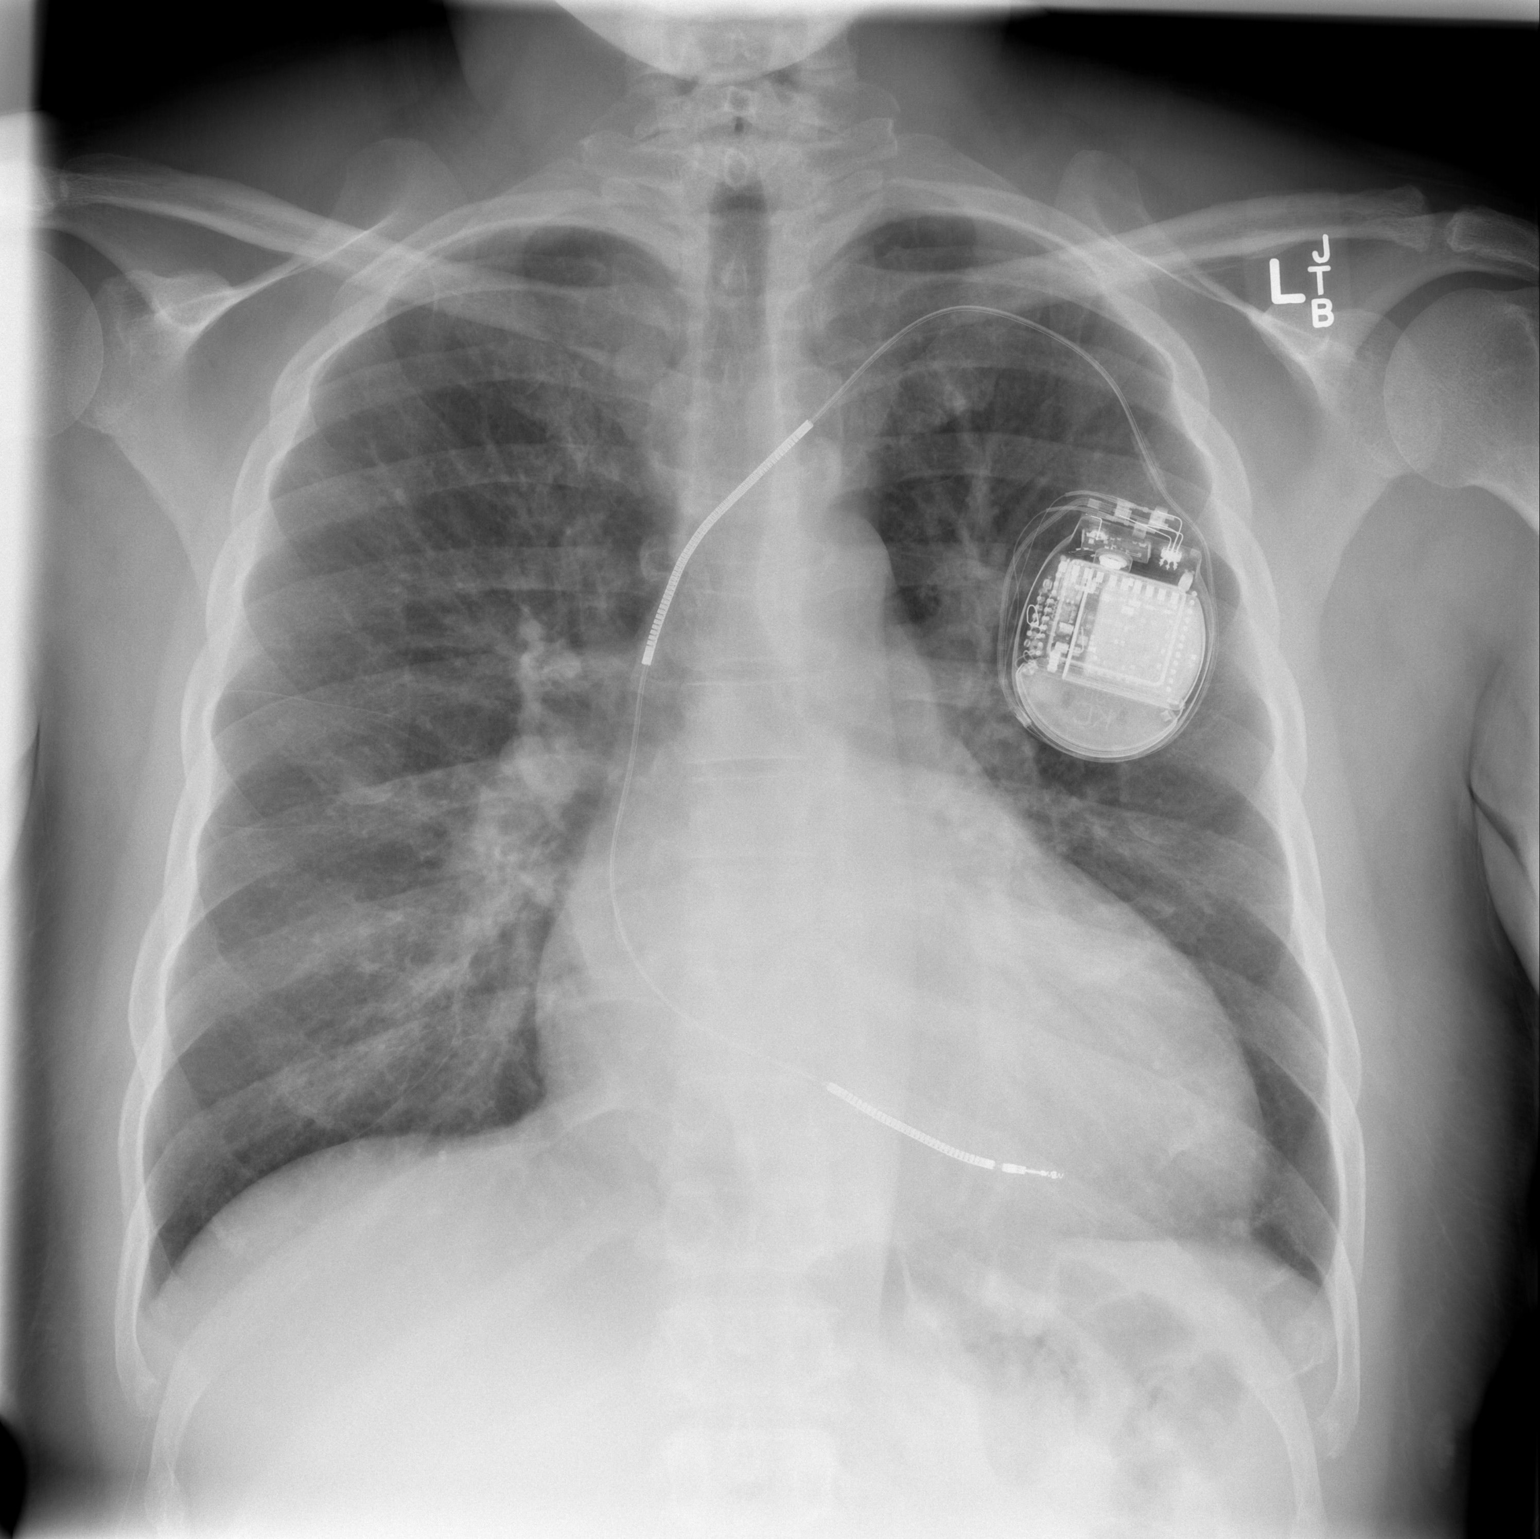

[w chest lat]
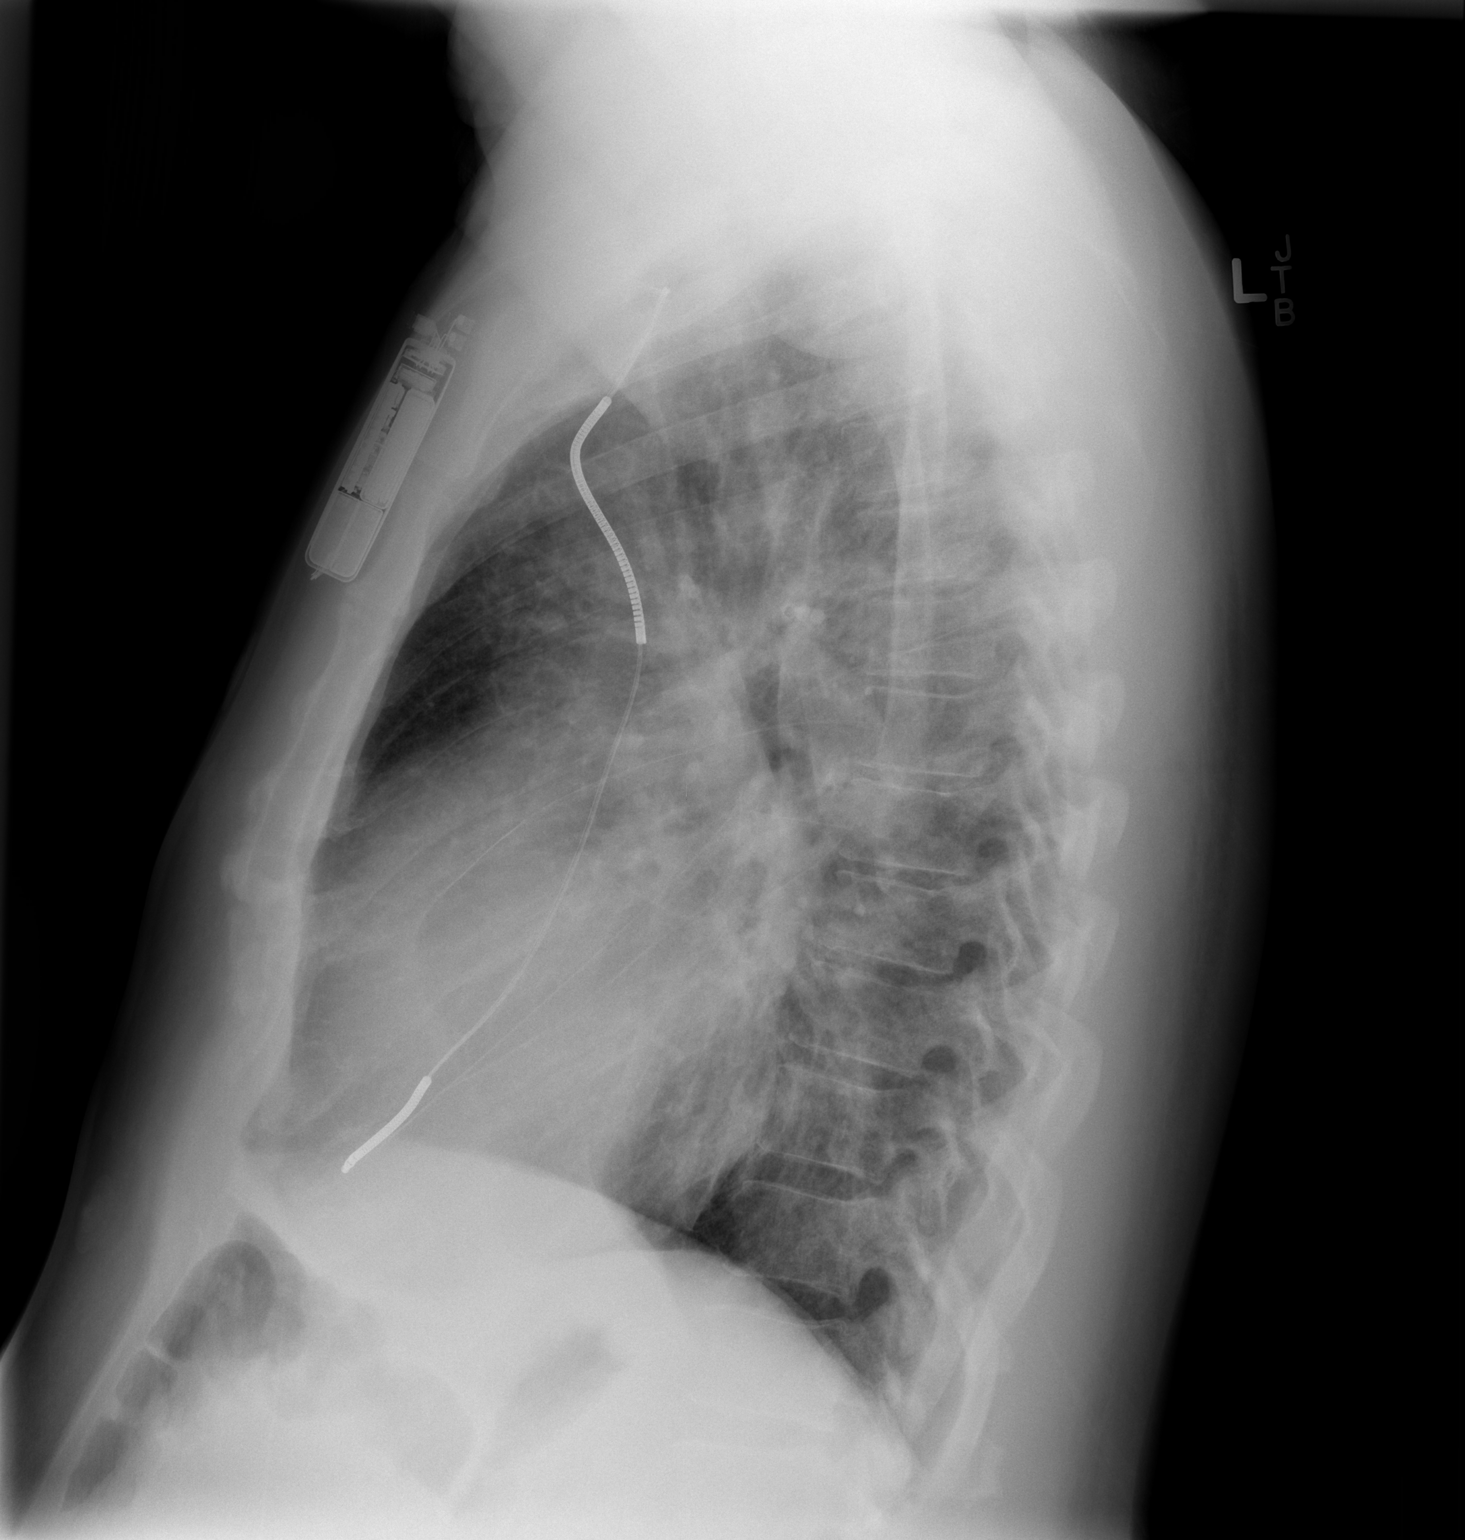

[2 of 2 positions shown; findings below may reference images not displayed]

FINDINGS: Single lead pacer good position from left-sided approach.
Cardiomegaly.  Mild vascular congestion.  Negative osseous
structures.  No effusion or pneumothorax.  Improved aeration from
[employer].
IMPRESSION: Cardiomegaly with mild vascular congestion.  Improved congestive
heart failure.

## 2014-01-23 IMAGING — CR DG CHEST 1V PORT
1 series · 1 of 1 positions shown · non-contrast
Comparison: Single view of the chest 08/04/2012.

CLINICAL DATA: PICC placement.

PORTABLE CHEST - 1 VIEW

[AP]
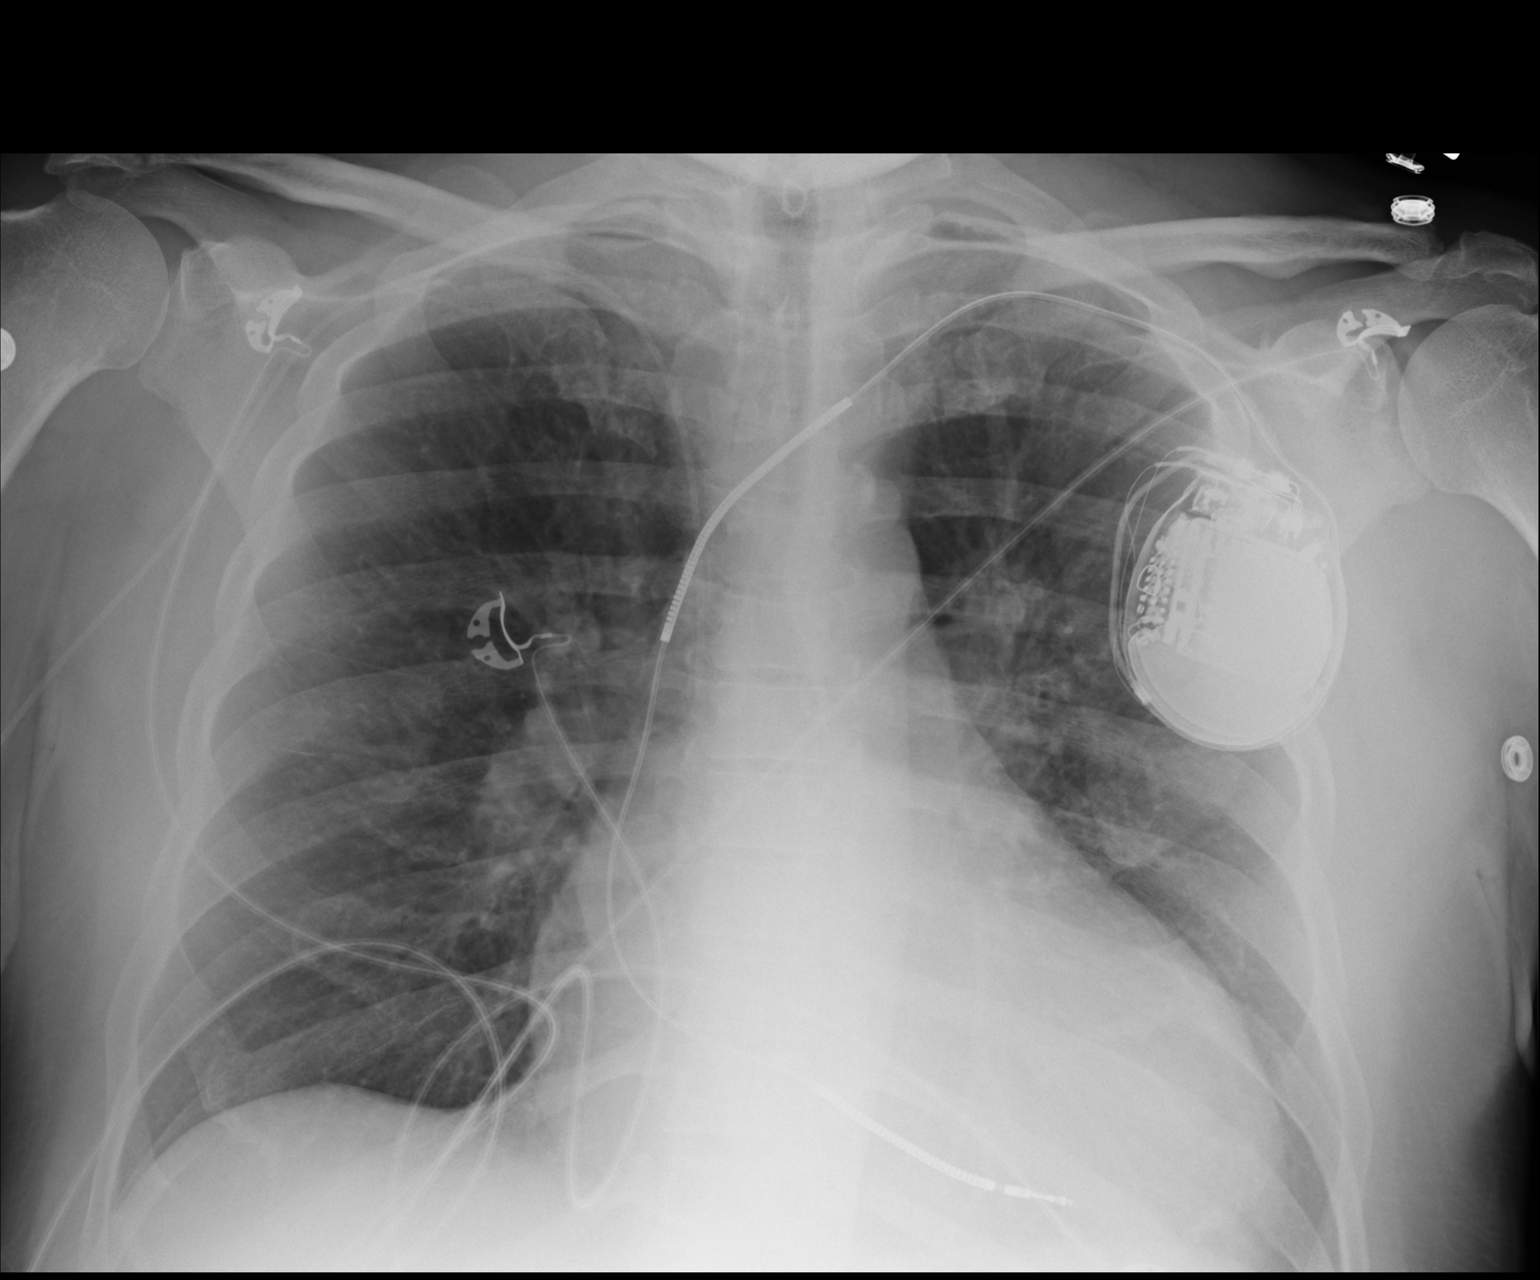

[1 of 1 positions shown; findings below may reference images not displayed]

FINDINGS: New right PICC is in place with the tip in the mid
superior vena cava.  There is cardiomegaly but no edema.  Lungs are
clear.  AICD noted.
IMPRESSION: 1.  Tip of right PICC projects over the mid superior vena cava.
2.  Cardiomegaly without acute disease.

## 2014-01-25 ENCOUNTER — Telehealth (HOSPITAL_COMMUNITY): Payer: Self-pay | Admitting: Vascular Surgery

## 2014-01-25 NOTE — Telephone Encounter (Signed)
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 Please Advise

## 2014-01-29 ENCOUNTER — Other Ambulatory Visit (HOSPITAL_COMMUNITY): Payer: Self-pay

## 2014-07-19 IMAGING — CT CT CHEST W/O CM
2 of 3 series · 15 of 36 positions shown, 18 images · non-contrast
Comparison: CT CHEST W/O CM dated 10/09/2012;

CLINICAL DATA: Nonproductive cough. Heart disease. Diabetes.
Hypertension. Lung nodule seen on prior imaging study.

EXAM:
CT CHEST WITHOUT CONTRAST
TECHNIQUE: Multidetector CT imaging of the chest was performed following the
standard protocol without IV contrast.

[Series 2: chest without · axial · non-contrast · 0.68mm/px · z∈[-301,-50]mm · 12 of 60 slices shown, 15 images]
[im 5/60  mediastinal]
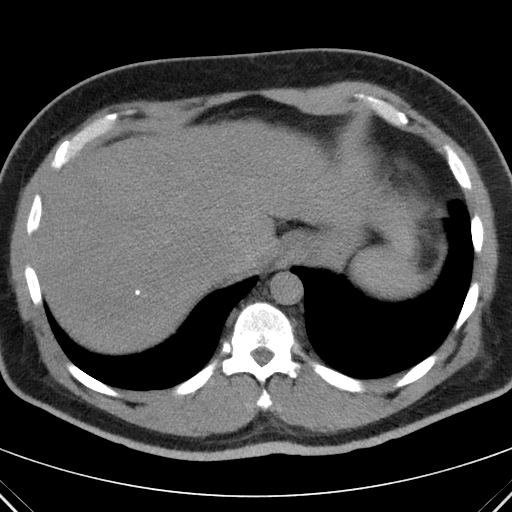
[im 5/60  lung]
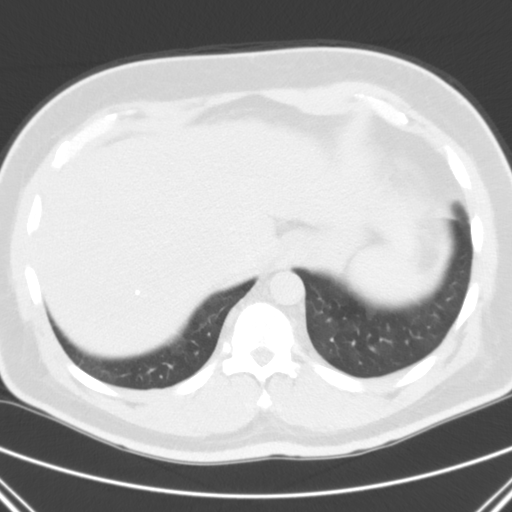
[im 9/60  lung]
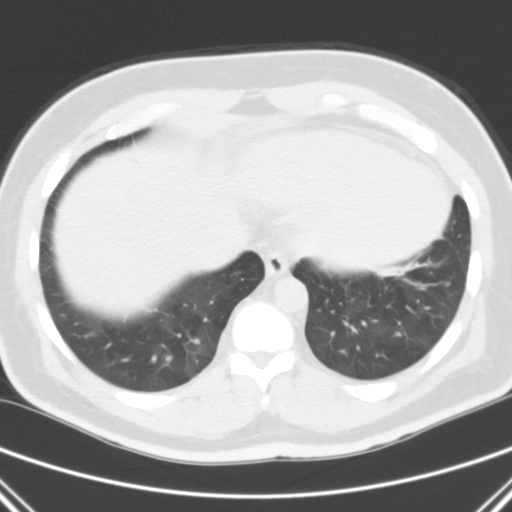
[im 14/60  lung]
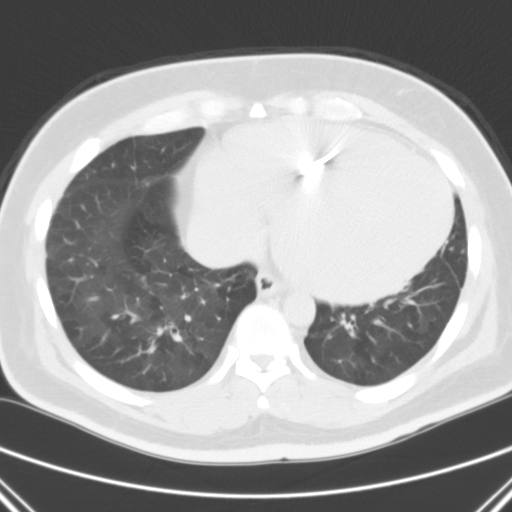
[im 18/60  lung]
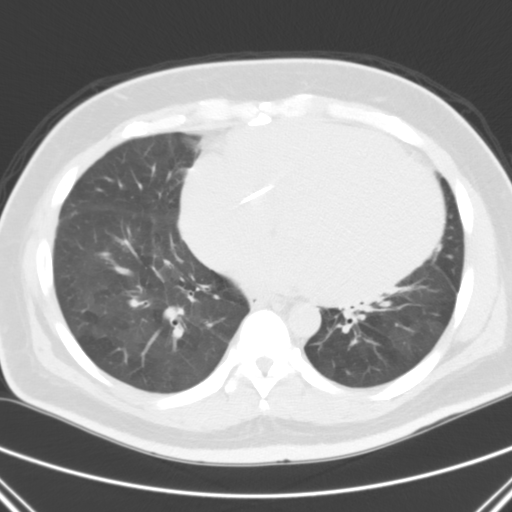
[im 22/60  mediastinal]
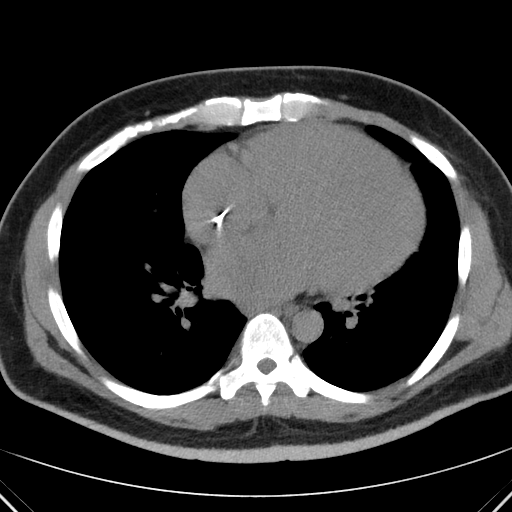
[im 22/60  lung]
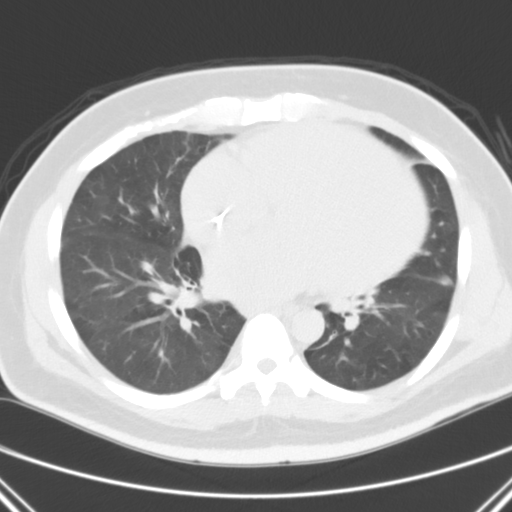
[im 27/60  lung]
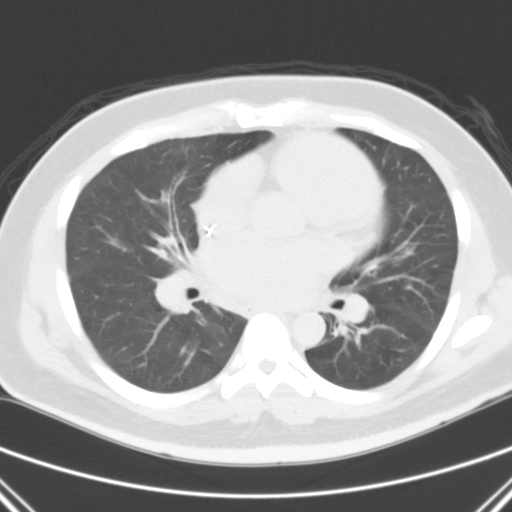
[im 33/60  lung]
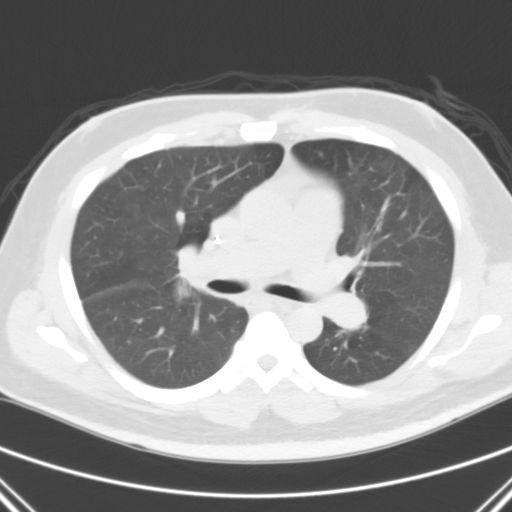
[im 38/60  lung]
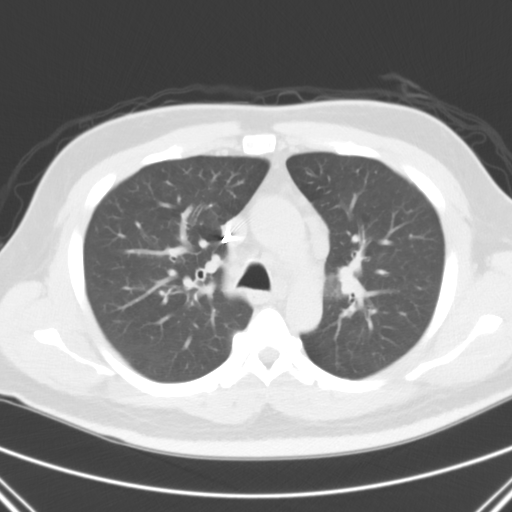
[im 42/60  mediastinal]
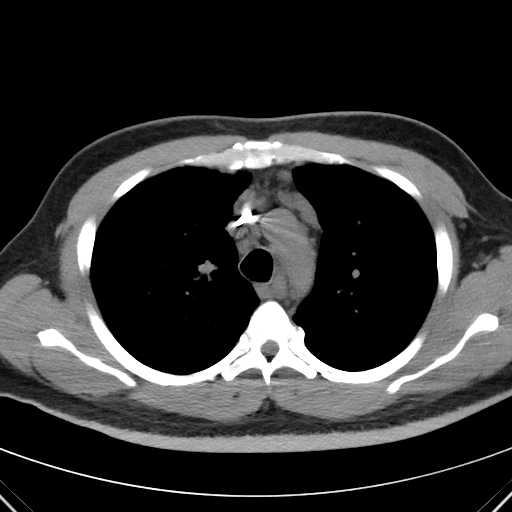
[im 42/60  lung]
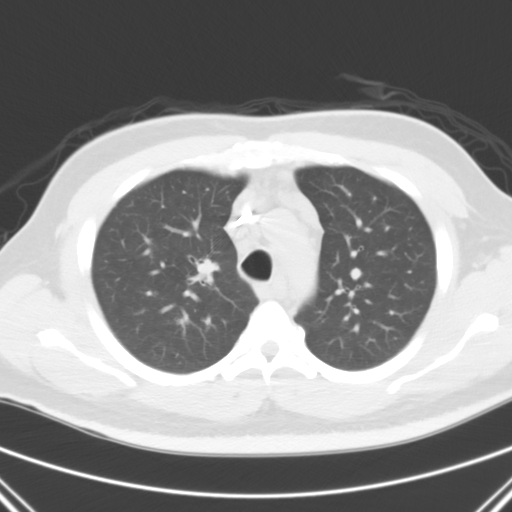
[im 46/60  lung]
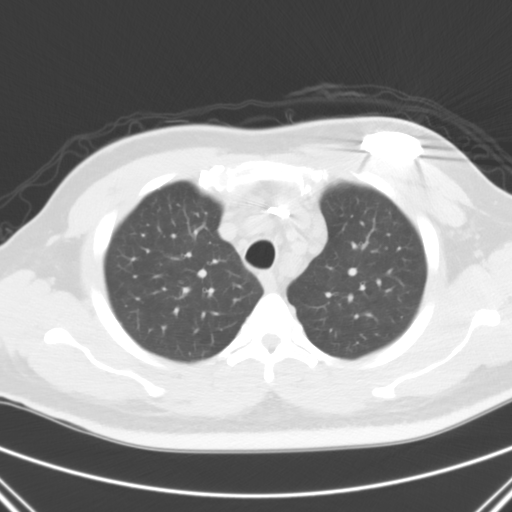
[im 51/60  lung]
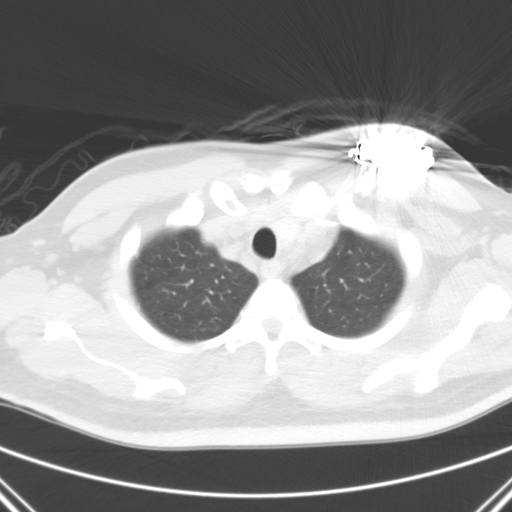
[im 55/60  lung]
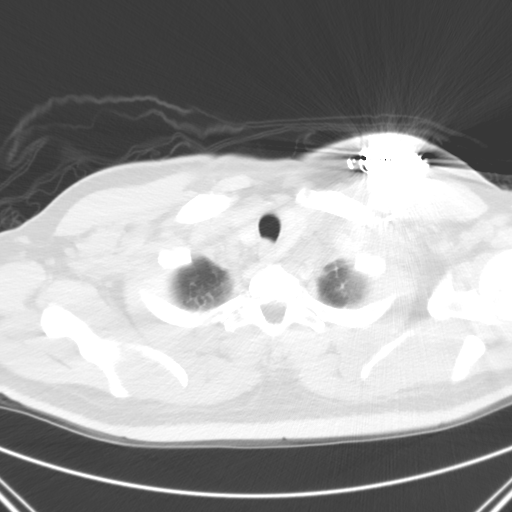

[coronal · coronal · 0.68mm/px · 3 of 76 slices shown]
[im 16/76  lung]
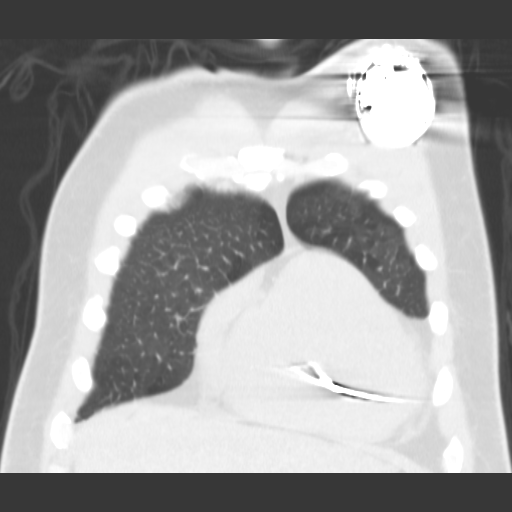
[im 31/76  lung]
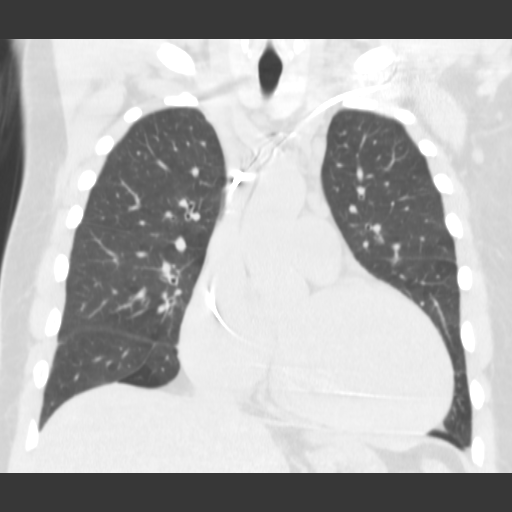
[im 46/76  lung]
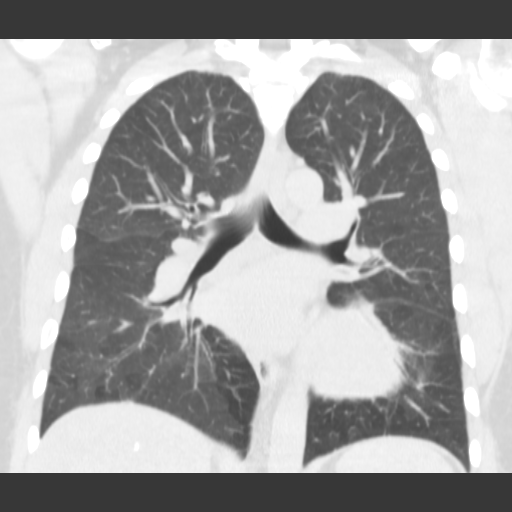

[15 of 36 positions shown; findings below may reference images not displayed]

CT CHEST W/O CM dated
10/12/2011; DG CHEST 1V PORT dated 08/06/2012; CT CHEST W/O CM dated
05/13/2010; CT ABD/PELVIS W CM dated 08/15/2009
FINDINGS: The right lower lobe nodule previously measuring 0.8 x 0.7 x 0.9 cm
currently measures 0.7 x 0.5 x 0.8 cm on image 29 of series 3. This
nodule was not visible on 10/12/2011 or 05/13/2010.

Bilateral airway thickening is increased from the prior exam. The
airway thickening does not appear particularly nodular. There is
mosaic attenuation in both lungs, similar to prior, with reduced
size of pulmonary vasculature in the darker segments

Mild lingular scarring. Essentially stable mediastinal adenopathy,
with a lower right paratracheal node measuring 1.3 cm in short axis
(formerly the same) on image 25 of series 2, and a prevascular lymph
node measuring 1.1 cm in short axis on image 19 of series 2.

Cardiomegaly noted. Pacemaker noted. Calcifications in the hepatic
parenchyma are likely from remote granulomatous disease.

The liver appears slightly nodular and heterogeneous on today's
noncontrast examination.
IMPRESSION: 1. The right lower lobe nodule has volumetrically reduced in size by
over 40% since the prior exam from [facility]. However, the nodule
has not resolved. Although this trend is reassuring, there are rare
cases in the medical literature of malignant nodules reducing in
size on consecutive CT examinations, presumably due to internal
involution/internal necrosis. Accordingly, I recommend a six-month
follow-up chest CT for further characterization. The nodule is below
reliable PET-CT size threshold.
2. Increased bilateral airway thickening, suspicious for bronchitis
or reactive airways disease.
3. Mosaic attenuation in the lungs, with a pattern characteristic of
small airways disease, or conceivably small vessel disease.
4. Stable mediastinal adenopathy. Although nonspecific and
potentially due to entities such as lymphoma, this is more likely
due to low grade congestive heart failure given the underlying
cardiomegaly.
5. Slightly nodular and heterogeneous contour of the liver,
suspicious for cirrhotic liver disease.

## 2018-06-19 DEATH — deceased

## 2019-01-14 ENCOUNTER — Encounter: Payer: Self-pay | Admitting: Cardiology

## 2023-01-24 NOTE — Telephone Encounter (Signed)
 error
